# Patient Record
Sex: Female | Born: 1955
Health system: Southern US, Community
[De-identification: ages and names within clinical notes are randomized; demographics above are authoritative.]

## PROBLEM LIST (undated history)

## (undated) DIAGNOSIS — E7800 Pure hypercholesterolemia, unspecified: Secondary | ICD-10-CM

## (undated) DIAGNOSIS — Z808 Family history of malignant neoplasm of other organs or systems: Secondary | ICD-10-CM

## (undated) DIAGNOSIS — I10 Essential (primary) hypertension: Secondary | ICD-10-CM

## (undated) DIAGNOSIS — E119 Type 2 diabetes mellitus without complications: Secondary | ICD-10-CM

## (undated) DIAGNOSIS — Z9221 Personal history of antineoplastic chemotherapy: Secondary | ICD-10-CM

## (undated) DIAGNOSIS — C50919 Malignant neoplasm of unspecified site of unspecified female breast: Secondary | ICD-10-CM

## (undated) DIAGNOSIS — I639 Cerebral infarction, unspecified: Secondary | ICD-10-CM

## (undated) DIAGNOSIS — Z923 Personal history of irradiation: Secondary | ICD-10-CM

## (undated) DIAGNOSIS — K219 Gastro-esophageal reflux disease without esophagitis: Secondary | ICD-10-CM

## (undated) DIAGNOSIS — T7840XA Allergy, unspecified, initial encounter: Secondary | ICD-10-CM

## (undated) DIAGNOSIS — Z803 Family history of malignant neoplasm of breast: Secondary | ICD-10-CM

## (undated) DIAGNOSIS — R12 Heartburn: Secondary | ICD-10-CM

## (undated) HISTORY — PX: OTHER SURGICAL HISTORY: SHX169

## (undated) HISTORY — DX: Heartburn: R12

## (undated) HISTORY — DX: Type 2 diabetes mellitus without complications: E11.9

## (undated) HISTORY — DX: Family history of malignant neoplasm of breast: Z80.3

## (undated) HISTORY — DX: Malignant neoplasm of unspecified site of unspecified female breast: C50.919

## (undated) HISTORY — DX: Cerebral infarction, unspecified: I63.9

## (undated) HISTORY — DX: Allergy, unspecified, initial encounter: T78.40XA

## (undated) HISTORY — PX: TUBAL LIGATION: SHX77

## (undated) HISTORY — DX: Family history of malignant neoplasm of other organs or systems: Z80.8

---

## 2008-11-13 ENCOUNTER — Emergency Department (HOSPITAL_COMMUNITY): Admission: EM | Admit: 2008-11-13 | Discharge: 2008-11-13 | Payer: Self-pay | Admitting: Emergency Medicine

## 2008-12-16 ENCOUNTER — Emergency Department (HOSPITAL_COMMUNITY): Admission: EM | Admit: 2008-12-16 | Discharge: 2008-12-16 | Payer: Self-pay | Admitting: Emergency Medicine

## 2009-02-17 ENCOUNTER — Emergency Department (HOSPITAL_COMMUNITY): Admission: EM | Admit: 2009-02-17 | Discharge: 2009-02-17 | Payer: Self-pay | Admitting: Emergency Medicine

## 2009-03-15 ENCOUNTER — Encounter: Payer: Self-pay | Admitting: Family Medicine

## 2009-03-15 ENCOUNTER — Ambulatory Visit: Payer: Self-pay | Admitting: Family Medicine

## 2009-03-15 DIAGNOSIS — I10 Essential (primary) hypertension: Secondary | ICD-10-CM | POA: Insufficient documentation

## 2009-03-15 DIAGNOSIS — IMO0002 Reserved for concepts with insufficient information to code with codable children: Secondary | ICD-10-CM | POA: Insufficient documentation

## 2009-03-15 DIAGNOSIS — T7411XA Adult physical abuse, confirmed, initial encounter: Secondary | ICD-10-CM | POA: Insufficient documentation

## 2009-03-15 LAB — CONVERTED CEMR LAB
ALT: 11 units/L (ref 0–35)
AST: 14 units/L (ref 0–37)
Albumin: 4.2 g/dL (ref 3.5–5.2)
Alkaline Phosphatase: 46 units/L (ref 39–117)
BUN: 11 mg/dL (ref 6–23)
CO2: 26 meq/L (ref 19–32)
Calcium: 9.2 mg/dL (ref 8.4–10.5)
Chloride: 103 meq/L (ref 96–112)
Creatinine, Ser: 1.01 mg/dL (ref 0.40–1.20)
Glucose, Bld: 90 mg/dL (ref 70–99)
Potassium: 4.3 meq/L (ref 3.5–5.3)
Sodium: 140 meq/L (ref 135–145)
Total Bilirubin: 0.3 mg/dL (ref 0.3–1.2)
Total Protein: 7.4 g/dL (ref 6.0–8.3)

## 2009-04-05 ENCOUNTER — Ambulatory Visit: Payer: Self-pay | Admitting: Family Medicine

## 2009-04-05 ENCOUNTER — Encounter: Payer: Self-pay | Admitting: Family Medicine

## 2009-04-06 LAB — CONVERTED CEMR LAB
Cholesterol: 271 mg/dL — ABNORMAL HIGH (ref 0–200)
HDL: 45 mg/dL (ref >39)
LDL Cholesterol: 212 mg/dL — ABNORMAL HIGH (ref 0–99)
Total CHOL/HDL Ratio: 6
Triglycerides: 71 mg/dL (ref <150)
VLDL: 14 mg/dL (ref 0–40)

## 2009-04-07 ENCOUNTER — Encounter: Payer: Self-pay | Admitting: Family Medicine

## 2009-04-19 ENCOUNTER — Encounter: Admission: RE | Admit: 2009-04-19 | Discharge: 2009-04-19 | Payer: Self-pay | Admitting: Family Medicine

## 2009-04-20 ENCOUNTER — Ambulatory Visit: Payer: Self-pay | Admitting: Family Medicine

## 2009-04-20 DIAGNOSIS — E785 Hyperlipidemia, unspecified: Secondary | ICD-10-CM | POA: Insufficient documentation

## 2009-04-20 LAB — CONVERTED CEMR LAB
Cholesterol, target level: 200 mg/dL
HDL goal, serum: 40 mg/dL
LDL Goal: 160 mg/dL

## 2009-07-12 ENCOUNTER — Encounter: Payer: Self-pay | Admitting: Family Medicine

## 2010-02-07 ENCOUNTER — Emergency Department (HOSPITAL_COMMUNITY)
Admission: EM | Admit: 2010-02-07 | Discharge: 2010-02-07 | Payer: Self-pay | Source: Home / Self Care | Admitting: Family Medicine

## 2010-02-18 ENCOUNTER — Emergency Department (HOSPITAL_COMMUNITY)
Admission: EM | Admit: 2010-02-18 | Discharge: 2010-02-18 | Payer: Self-pay | Source: Home / Self Care | Admitting: Family Medicine

## 2010-03-01 ENCOUNTER — Telehealth: Payer: Self-pay | Admitting: *Deleted

## 2010-03-14 ENCOUNTER — Ambulatory Visit: Payer: Self-pay | Admitting: Family Medicine

## 2010-03-15 ENCOUNTER — Encounter: Payer: Self-pay | Admitting: Sports Medicine

## 2010-03-15 LAB — CONVERTED CEMR LAB
ALT: 14 units/L (ref 0–35)
AST: 18 units/L (ref 0–37)
Albumin: 4.4 g/dL (ref 3.5–5.2)
Alkaline Phosphatase: 51 units/L (ref 39–117)
BUN: 12 mg/dL (ref 6–23)
CO2: 27 meq/L (ref 19–32)
Calcium: 9.7 mg/dL (ref 8.4–10.5)
Chloride: 100 meq/L (ref 96–112)
Creatinine, Ser: 0.88 mg/dL (ref 0.40–1.20)
Direct LDL: 200 mg/dL — ABNORMAL HIGH
Glucose, Bld: 85 mg/dL (ref 70–99)
Potassium: 3.8 meq/L (ref 3.5–5.3)
Sodium: 137 meq/L (ref 135–145)
Total Bilirubin: 0.3 mg/dL (ref 0.3–1.2)
Total Protein: 7.9 g/dL (ref 6.0–8.3)

## 2010-04-04 ENCOUNTER — Telehealth: Payer: Self-pay | Admitting: Sports Medicine

## 2010-05-02 NOTE — Assessment & Plan Note (Signed)
Summary: f/u labs,df   Vital Signs:  Patient profile:   55 year old female Weight:      175 pounds Temp:     99.2 degrees F oral Pulse rate:   96 / minute Pulse rhythm:   regular BP sitting:   134 / 83  Vitals Entered By: Loralee Pacas CMA (April 20, 2009 3:38 PM)  Primary Care Provider:  Lequita Asal  MD  CC:  f/u HLD.  History of Present Illness: patient with h/o HTN. recent lipid panel with LDL 212 and total cholesterol of 271. patient here to discuss whether to start medication. on medication in the past. doesnt recall name. caused hair loss and "not feeling right." so it was stopped. takes OTC niacin daily. not interested in medications.   Lipid Management History:      Positive NCEP/ATP III risk factors include hypertension.  Negative NCEP/ATP III risk factors include female age less than 12 years old, no history of early menopause without estrogen hormone replacement, non-diabetic, no family history for ischemic heart disease, non-tobacco-user status, no ASHD (atherosclerotic heart disease), no prior stroke/TIA, no peripheral vascular disease, and no history of aortic aneurysm.    Current Medications (verified): 1)  Lisinopril-Hydrochlorothiazide 20-25 Mg Tabs (Lisinopril-Hydrochlorothiazide) .... One Tab By Mouth Daily 2)  Niacin 500 Mg Tabs (Niacin) .... One Tab By Mouth Daily 3)  Multivitamins  Tabs (Multiple Vitamin) .... One Tab By Mouth Daily 4)  Ibuprofen 800 Mg Tabs (Ibuprofen) .... One Tab By Mouth Three Times A Day As Needed For Leg Pain  Allergies (verified): No Known Drug Allergies  Past History:  Past Medical History: HTN HLD  Physical Exam  General:  Well-developed,well-nourished,in no acute distress; alert,appropriate and cooperative throughout examination. vitals reviewed.    Impression & Recommendations:  Problem # 1:  HYPERLIPIDEMIA (ICD-272.4) Assessment New  patient resistant to starting statin. would like to try lifestyle  modifications first. explained importance of lowering cholesterol particularly with hypertension given cardiovascular risks. she expressed understanding. f/u in 5 months.   Her updated medication list for this problem includes:    Niacin 500 Mg Tabs (Niacin) ..... One tab by mouth daily  Orders: [facility]- Est Level  3 (66063)  Problem # 2:  ESSENTIAL HYPERTENSION, BENIGN (ICD-401.1) Assessment: Unchanged  at goal. no changes.   Her updated medication list for this problem includes:    Lisinopril-hydrochlorothiazide 20-25 Mg Tabs (Lisinopril-hydrochlorothiazide) ..... One tab by mouth daily  Orders: [facility]- Est Level  3 (01601)  Complete Medication List: 1)  Lisinopril-hydrochlorothiazide 20-25 Mg Tabs (Lisinopril-hydrochlorothiazide) .... One tab by mouth daily 2)  Niacin 500 Mg Tabs (Niacin) .... One tab by mouth daily 3)  Multivitamins Tabs (Multiple vitamin) .... One tab by mouth daily 4)  Ibuprofen 800 Mg Tabs (Ibuprofen) .... One tab by mouth three times a day as needed for leg pain  Lipid Assessment/Plan:      Based on NCEP/ATP III, the patient's risk factor category is "0-1 risk factors".  The patient's lipid goals are as follows: Total cholesterol goal is 200; LDL cholesterol goal is 160; HDL cholesterol goal is 40; Triglyceride goal is 150.  Her LDL cholesterol goal has not been met.  She has been counseled on adjunctive measures for lowering her cholesterol and has been provided with dietary instructions.      Prevention & Chronic Care Immunizations   Influenza vaccine: received at outside hospital  (03/02/2009)    Tetanus booster: Not documented    Pneumococcal vaccine: Not documented  Pneumococcal vaccine deferral: Not indicated  (03/15/2009)  Colorectal Screening   Hemoccult: Not documented    Colonoscopy: Not documented   Colonoscopy action/deferral: GI Referral  (03/15/2009)  Other Screening   Pap smear: NEGATIVE FOR INTRAEPITHELIAL LESIONS OR MALIGNANCY.   (04/05/2009)   Pap smear action/deferral: Deferred  (03/15/2009)    Mammogram: ASSESSMENT: Negative - BI-RADS 1^MM DIGITAL SCREENING  (04/19/2009)   Mammogram action/deferral: Ordered  (03/15/2009)   Smoking status: never  (03/15/2009)  Lipids   Total Cholesterol: 271  (04/05/2009)   LDL: 212  (04/05/2009)   LDL Direct: Not documented   HDL: 45  (04/05/2009)   Triglycerides: 71  (04/05/2009)    SGOT (AST): 14  (03/15/2009)   SGPT (ALT): 11  (03/15/2009)   Alkaline phosphatase: 46  (03/15/2009)   Total bilirubin: 0.3  (03/15/2009)    Lipid flowsheet reviewed?: Yes   Progress toward LDL goal: Unchanged  Hypertension   Last Blood Pressure: 134 / 83  (04/20/2009)   Serum creatinine: 1.01  (03/15/2009)   Serum potassium 4.3  (03/15/2009)    Hypertension flowsheet reviewed?: Yes   Progress toward BP goal: At goal  Self-Management Support :   Personal Goals (by the next clinic visit) :      Personal blood pressure goal: 140/90  (03/15/2009)     Personal LDL goal: 130  (04/20/2009)    Patient will work on the following items until the next clinic visit to reach self-care goals:     Medications and monitoring: take my medicines every day  (04/05/2009)     Eating: eat baked foods instead of fried foods  (04/20/2009)     Activity: take a 30 minute walk every day  (04/20/2009)    Hypertension self-management support: Not documented    Hypertension self-management support not done because: Good outcomes  (04/20/2009)    Lipid self-management support: Not documented     Lipid self-management support not done because: Refused  (04/20/2009)

## 2010-05-02 NOTE — Assessment & Plan Note (Signed)
Summary: F/U EO   Vital Signs:  Patient profile:   55 year old female Height:      63.25 inches Weight:      175 pounds BMI:     30.87 Pulse rate:   66 / minute BP sitting:   120 / 80  (right arm)  Vitals Entered By: Arlyss Repress CMA, (April 05, 2009 9:05 AM) CC: f/up HTN and pap Is Patient Diabetic? No Pain Assessment Patient in pain? yes     Location: legs Intensity: 3 Onset of pain  x 1 month   CC:  f/up HTN and pap.  History of Present Illness: 55 y/o female here for f/u HTN, pap, and leg pain  bilateral lower extremity "aching"- improved with exercises and aleve. no swelling, calf pain. no h/o tobacco abuse.   HTN- taking lisinopril-hctz. denies chest pain, SOB, headaches, blurred vision.     Current Medications (verified): 1)  Lisinopril-Hydrochlorothiazide 20-25 Mg Tabs (Lisinopril-Hydrochlorothiazide) .... One Tab By Mouth Daily 2)  Niacin 500 Mg Tabs (Niacin) .... One Tab By Mouth Daily 3)  Multivitamins  Tabs (Multiple Vitamin) .... One Tab By Mouth Daily 4)  Ibuprofen 800 Mg Tabs (Ibuprofen) .... One Tab By Mouth Three Times A Day As Needed For Leg Pain  Allergies (verified): No Known Drug Allergies  Physical Exam  General:  Well-developed,well-nourished,in no acute distress; alert,appropriate and cooperative throughout examination. vitals reviewed.  Mouth:  Oral mucosa and oropharynx without lesions or exudates.  Teeth in good repair. Lungs:  Normal respiratory effort, chest expands symmetrically. Lungs are clear to auscultation, no crackles or wheezes. Heart:  Normal rate and regular rhythm. S1 and S2 normal without gallop, murmur, click, rub or other extra sounds. Genitalia:  Normal introitus for age, no external lesions, no vaginal discharge, mucosa pink and moist, no vaginal or cervical lesions, no vaginal atrophy, no friaility or hemorrhage, normal uterus size and position, no adnexal masses or tenderness Extremities:  TTP of bilateral anterior  shins. no pitting edema of BLE. no palpable cords or posterior calf tenderness.    Impression & Recommendations:  Problem # 1:  ESSENTIAL HYPERTENSION, BENIGN (ICD-401.1) Assessment Unchanged  no changes. check FLP.  Her updated medication list for this problem includes:    Lisinopril-hydrochlorothiazide 20-25 Mg Tabs (Lisinopril-hydrochlorothiazide) ..... One tab by mouth daily  Orders: [facility]- Est Level  3 (11914)  Problem # 2:  SHIN SPLINTS (ICD-844.9) Assessment: Improved  change to high dose ibuprofen from aleve.   Orders: [facility]- Est Level  3 ([id number])  Problem # 3:  Preventive Health Care (ICD-V70.0) pap completed. info for mammogram provided. will attempt GI referral for screening colonoscopy  Complete Medication List: 1)  Lisinopril-hydrochlorothiazide 20-25 Mg Tabs (Lisinopril-hydrochlorothiazide) .... One tab by mouth daily 2)  Niacin 500 Mg Tabs (Niacin) .... One tab by mouth daily 3)  Multivitamins Tabs (Multiple vitamin) .... One tab by mouth daily 4)  Ibuprofen 800 Mg Tabs (Ibuprofen) .... One tab by mouth three times a day as needed for leg pain  Other Orders: Pap Smear-[facility] ([id number]) Gastroenterology Referral (GI)  Patient Instructions: 1)  Follow up with Dr. Lanier Prude in 5 months (June) 2)  I will contact you with results of your bloodwork 3)  We will call you with GI referral for colonoscopy. Prescriptions: IBUPROFEN 800 MG TABS (IBUPROFEN) one tab by mouth three times a day as needed for leg pain  #90 x 3   Entered and Authorized by:   Lequita Asal  MD  Signed by:   Lequita Asal  MD on 04/05/2009   Method used:   Electronically to        [facility] (retail)       [address]       [place], [place]  [address]       Ph: [phone]       Fax: [phone]   RxID:   [id number]   Prevention & Chronic Care Immunizations   Influenza vaccine: received at outside hospital  (03/02/2009)    Tetanus booster:  Not documented    Pneumococcal vaccine: Not documented   Pneumococcal vaccine deferral: Not indicated  (03/15/2009)  Colorectal Screening   Hemoccult: Not documented    Colonoscopy: Not documented   Colonoscopy action/deferral: GI Referral  (03/15/2009)  Other Screening   Pap smear: Not documented   Pap smear action/deferral: Deferred  (03/15/2009)    Mammogram: Not documented   Mammogram action/deferral: Ordered  (03/15/2009)   Smoking status: never  (03/15/2009)  Lipids   Total Cholesterol: Not documented   LDL: Not documented   LDL Direct: Not documented   HDL: Not documented   Triglycerides: Not documented  Hypertension   Last Blood Pressure: 120 / 80  (04/05/2009)   Serum creatinine: 1.01  (03/15/2009)   Serum potassium 4.3  (03/15/2009)    Hypertension flowsheet reviewed?: Yes   Progress toward BP goal: At goal  Self-Management Support :   Personal Goals (by the next clinic visit) :      Personal blood pressure goal: 140/90  (03/15/2009)   Patient will work on the following items until the next clinic visit to reach self-care goals:     Medications and monitoring: take my medicines every day  (04/05/2009)     Eating: eat foods that are low in salt  (04/05/2009)     Activity: take a 30 minute walk every day  (03/15/2009)    Hypertension self-management support: Not documented    Hypertension self-management support not done because: Good outcomes  (04/05/2009)
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
## 2010-05-02 NOTE — Letter (Signed)
Summary: Results Follow-up Letter  [facility]  [address]   [place], [place] [address]   Phone: [phone]  Fax: [phone]    04/07/2009  [address] [place], [place]  [address]  Dear Ms. Lorton,   The following are the results of your recent test(s):  Test     Result     Pap Smear    Normal___x__  Not Normal_____       Comments: Please call if you have questions.    Sincerely,  Lequita Asal  MD [name]           Appended Document: Results Follow-up Letter mailed.

## 2010-05-02 NOTE — Progress Notes (Signed)
  Phone Note Refill Request   Refills Requested: Medication #1:  LISINOPRIL-HYDROCHLOROTHIAZIDE 20-25 MG TABS one tab by mouth daily Pt need refill sent to [facility]   Initial call taken by: Abundio Miu,  March 01, 2010 4:31 PM  Follow-up for Phone Call        Pt should come see me within a couple weeks to make sure things are going well and catch up on preventative issues, 1 month rx called in. Follow-up by: [name] MD,  March 02, 2010 8:58 AM  Additional Follow-up for Phone Call Additional follow up Details #1::        called and informed pt she has appt on 12.13 Additional Follow-up by: Loralee Pacas CMA,  March 03, 2010 1:47 PM    Prescriptions: LISINOPRIL-HYDROCHLOROTHIAZIDE 20-25 MG TABS (LISINOPRIL-HYDROCHLOROTHIAZIDE) one tab by mouth daily  #30 x 0   Entered and Authorized by:   [name] MD   Signed by:   [name] MD on 03/02/2010   Method used:   Electronically to        [facility] [address] (retail)       [phone]       [place], [place]  [address]       Ph: [phone]       Fax: [phone]   RxID:   [id number]

## 2010-05-02 NOTE — Miscellaneous (Signed)
Summary: Re: GI referral  Clinical Lists Changes   received notification from [facility] that they are unable to  complete the referral for GI at this time due to lack of volunteer physicians in this speciality group. they will notifiy patient when they can process the referral. Theresia Lo RN  July 12, 2009 8:52 AM

## 2010-05-03 ENCOUNTER — Encounter (INDEPENDENT_AMBULATORY_CARE_PROVIDER_SITE_OTHER): Payer: Self-pay | Admitting: Sports Medicine

## 2010-05-03 ENCOUNTER — Ambulatory Visit: Admit: 2010-05-03 | Payer: Self-pay

## 2010-05-03 ENCOUNTER — Encounter: Payer: Self-pay | Admitting: Sports Medicine

## 2010-05-03 DIAGNOSIS — I10 Essential (primary) hypertension: Secondary | ICD-10-CM

## 2010-05-03 DIAGNOSIS — E785 Hyperlipidemia, unspecified: Secondary | ICD-10-CM

## 2010-05-03 NOTE — Progress Notes (Signed)
  Subjective:    Patient ID: Tasha Ortiz, female    DOB: Nov 24, 1955, 54 y.o.   MRN: [id number]  HPI    Review of Systems     Objective:   Physical Exam        Assessment & Plan:

## 2010-05-03 NOTE — Assessment & Plan Note (Signed)
Needs refill on lisinopril/hctz.

## 2010-05-03 NOTE — Assessment & Plan Note (Signed)
LE cramping may be statin ADR however alopecia is not from statin. I suspect that her hair loss may be related to alopecia areata with the circular area of hair loss however will assess for regrowth off pravastatin. We will change to Lovastatin  And see if her cramps come back. Should her symptoms recur would need to switch classes, likely to fenofibrate. RTC prn.

## 2010-05-03 NOTE — Progress Notes (Signed)
  Subjective:    Patient ID: Tasha Ortiz, female    DOB: 03-Oct-1955, 55 y.o.   MRN: [id number]  HPI Pt comes in with c/o medication side effect.  She started taking her pravastatin and within a week legs were hurting and hair started falling out.  Leg pain was crampy/burning type pain.  B/L calves.  Stopped taking her pravastatin 2 wks ago and symptoms improving. Hair would come out when she brushed her hair so she cut it all off and started wearing a wig.     Review of Systems   Neg except as in HPI  Objective:   Physical Exam  Constitutional: She appears well-developed and well-nourished. No distress.  HENT:       Hair is short with a circular area of alopecia on top left side.    Cardiovascular: Normal rate, regular rhythm, normal heart sounds and intact distal pulses.   Pulmonary/Chest: Effort normal and breath sounds normal.  Musculoskeletal: Normal range of motion. She exhibits no edema and no tenderness.  Skin: Skin is warm and dry.          Assessment & Plan:

## 2010-05-04 NOTE — Letter (Signed)
Summary: Lipid Letter  [facility]  [address]   [place], [place] [address]   Phone: [phone]  Fax: [phone]    03/15/2010  Tasha Ortiz [address] [place], [place]  [address]  Dear Tasha Ortiz:  We have carefully reviewed your last lipid profile from 04/05/2009 and the results are noted below with a summary of recommendations for lipid management.    LDL "bad" Cholesterol:   200     Goal: <160    Your lipid goals have not been met; we recommend improving on your TLC diet and Adjunctive Measures (see below) and make the following changes to your regimen:  You will need to stop your Niacin and start a new cholesterol medication, Pravastatin.    TLC Diet (Therapeutic Lifestyle Change): Saturated Fats & Transfatty acids should be kept < 7% of total calories ***Reduce Saturated Fats Polyunstaurated Fat can be up to 10% of total calories Monounsaturated Fat Fat can be up to 20% of total calories Total Fat should be no greater than 25-35% of total calories Carbohydrates should be 50-60% of total calories Protein should be approximately 15% of total calories Fiber should be at least 20-30 grams a day ***Increased fiber may help lower LDL Total Cholesterol should be < 200mg /day Consider adding plant stanol/sterols to diet (example: Benacol spread) ***A higher intake of unsaturated fat may reduce Triglycerides and Increase HDL    Adjunctive Measures (may lower LIPIDS and reduce risk of Heart Attack) include: Aerobic Exercise (20-30 minutes 3-4 times a week) Limit Alcohol Consumption Weight Reduction Aspirin 75-81 mg a day by mouth (if not allergic or contraindicated) Dietary Fiber 20-30 grams a day by mouth     Current Medications: 1)    Lisinopril-hydrochlorothiazide 20-25 Mg Tabs (Lisinopril-hydrochlorothiazide) .... One tab by mouth daily 2)    Pravastatin Sodium 40 Mg Tabs (Pravastatin sodium) .... One tab by mouth daily 3)    Multivitamins  Tabs  (Multiple vitamin) .... One tab by mouth daily 4)    Ibuprofen 800 Mg Tabs (Ibuprofen) .... One tab by mouth three times a day as needed for leg pain  If you have any questions, please call. We appreciate being able to work with you.   Sincerely,    [name] [name] MD  Appended Document: Lipid Letter mailed

## 2010-05-04 NOTE — Progress Notes (Signed)
Summary: Medication side effects  Phone Note Call from Patient Call back at Home Phone [phone]   Reason for Call: Talk to Doctor Summary of Call: pt having side effects from cholesterol medication, wants to speak with MD. Initial call taken by: Knox Royalty,  April 04, 2010 10:30 AM  Follow-up for Phone Call        fwd to pcp Follow-up by: Jimmy Footman, CMA,  April 04, 2010 10:53 AM  Additional Follow-up for Phone Call Additional follow up Details #1::        On inpatient service so won't be able to call her for a while,  what is the side effect? Additional Follow-up by: [name] MD,  April 04, 2010 6:27 PM    Additional Follow-up for Phone Call Additional follow up Details #2::    LVM for pt to call back to give [place] the side effects of med she is having.  Follow-up by: Jimmy Footman, CMA,  April 05, 2010 4:36 PM  Additional Follow-up for Phone Call Additional follow up Details #3:: Details for Additional Follow-up Action Taken: hair is falling out and legs are bothering her please advise Additional Follow-up by: Loralee Pacas CMA,  April 06, 2010 3:06 PM  She can come see me regarding this.  Ok to stop the medication for now though these symptoms may be due to something else. [name] MD  April 06, 2010 3:22 PM   spoke with pt and she stated that she took med this am but will d/c after today. she has and appt on 01.26 to follow up with you concerning this.Marland KitchenMarland KitchenLoralee Pacas CMA  April 07, 2010 10:53 AM

## 2010-05-04 NOTE — Assessment & Plan Note (Signed)
Summary: F/U,DF   Vital Signs:  Patient profile:   55 year old female Weight:      177 pounds Temp:     98.6 degrees F oral Pulse rate:   66 / minute Pulse rhythm:   regular BP sitting:   132 / 78  (left arm) Cuff size:   regular  Vitals Entered By: Loralee Pacas CMA (March 14, 2010 2:10 PM)  Primary Care Provider:  Lequita Asal  MD   History of Present Illness: 55 yo female here with stuffy ears.  Ears: feel stopped up, for approx 2 weeks now, no URI symptoms, no tinnitus, Balance ok.  HLD:  Due for LDL.  HTN:  BP well controlled.  Preventive medicine:  Due for flu shot/colonoscopy, she has tried to get a colonoscopy but has no insurance and so has been put on a waiting list.  Current Medications (verified): 1)  Lisinopril-Hydrochlorothiazide 20-25 Mg Tabs (Lisinopril-Hydrochlorothiazide) .... One Tab By Mouth Daily 2)  Niacin 500 Mg Tabs (Niacin) .... One Tab By Mouth Daily 3)  Multivitamins  Tabs (Multiple Vitamin) .... One Tab By Mouth Daily 4)  Ibuprofen 800 Mg Tabs (Ibuprofen) .... One Tab By Mouth Three Times A Day As Needed For Leg Pain  Allergies (verified): No Known Drug Allergies  Review of Systems       See HPI  Physical Exam  General:  Well-developed,well-nourished,in no acute distress; alert,appropriate and cooperative throughout examination Head:  Normocephalic and atraumatic without obvious abnormalities. No apparent alopecia or balding. Eyes:  No corneal or conjunctival inflammation noted. EOMI. Perrl Ears:  B/l cerumen impaction, flushed but eventually had to dig out impactions with curette, massive amounts of impacted cerumen removed from both EAMs.  Pt significantly better after this. Nose:  External nasal examination shows no deformity or inflammation. Nasal mucosa are pink and moist without lesions or exudates. Mouth:  Oral mucosa and oropharynx without lesions or exudates.   Neck:  No deformities, masses, or tenderness noted. Lungs:   Normal respiratory effort, chest expands symmetrically. Lungs are clear to auscultation, no crackles or wheezes. Heart:  Normal rate and regular rhythm. S1 and S2 normal without gallop, murmur, click, rub or other extra sounds.   Impression & Recommendations:  Problem # 1:  CERUMEN IMPACTION, BILATERAL (ICD-380.4) Assessment New Removed.  Orders: [facility]- Est  Level 4 (30865) Cerumen Impaction Removal-[facility] (78469)  Problem # 2:  HYPERLIPIDEMIA (ICD-272.4) Assessment: Unchanged Checking dLDL, if uncontrolled would start pravastatin.  Her updated medication list for this problem includes:    Niacin 500 Mg Tabs (Niacin) ..... One tab by mouth daily  Orders: [facility]- Est  Level 4 (99214) Comp Met-[facility] (62952-84132) Direct LDL-[facility] (44010-27253)  Problem # 3:  ESSENTIAL HYPERTENSION, BENIGN (ICD-401.1) Assessment: Improved Controlled, no changes.  Her updated medication list for this problem includes:    Lisinopril-hydrochlorothiazide 20-25 Mg Tabs (Lisinopril-hydrochlorothiazide) ..... One tab by mouth daily  Orders: [facility]- Est  Level 4 (99214) Comp Met-[facility] (66440-34742)  Complete Medication List: 1)  Lisinopril-hydrochlorothiazide 20-25 Mg Tabs (Lisinopril-hydrochlorothiazide) .... One tab by mouth daily 2)  Niacin 500 Mg Tabs (Niacin) .... One tab by mouth daily 3)  Multivitamins Tabs (Multiple vitamin) .... One tab by mouth daily 4)  Ibuprofen 800 Mg Tabs (Ibuprofen) .... One tab by mouth three times a day as needed for leg pain   Orders Added: 1)  [facility]- Est  Level 4 [99214] 2)  Cerumen Impaction Removal-[facility] [69210] 3)  Comp Met-[facility] [59563-87564] 4)  Direct LDL-[facility] [33295-18841]  CMP ordered   CMP ordered

## 2010-05-10 NOTE — Assessment & Plan Note (Signed)
Summary: meds causing loss of hair/tlb   Vital Signs:  Patient profile:   55 year old female Height:      63.25 inches Weight:      180.6 pounds BMI:     31.85 Temp:     98.4 degrees F oral Pulse rate:   71 / minute BP sitting:   144 / 83  (left arm) Cuff size:   regular  Vitals Entered By: Jimmy Footman, CMA (May 03, 2010 9:09 AM) CC: follow-up visit Is Patient Diabetic? No   Primary Care Provider:  [name] MD  CC:  follow-up visit.  History of Present Illness: HPI Pt comes in with c/o medication side effect.  She started taking her pravastatin and within a week legs were hurting and hair started falling out.  Leg pain was crampy/burning type pain.  B/L calves.  Stopped taking her pravastatin 2 wks ago and symptoms improving. Hair would come out when she brushed her hair so she cut it all off and started wearing a wig.       Review of Systems   Neg except as in HPI   Objective:    Physical Exam  Constitutional: She appears well-developed and well-nourished. No distress.  HENT:       Hair is short with a circular area of alopecia on top left side.    Cardiovascular: Normal rate, regular rhythm, normal heart sounds and intact distal pulses.   Pulmonary/Chest: Effort normal and breath sounds normal.  Musculoskeletal: Normal range of motion. She exhibits no edema and no tenderness.  Skin: Skin is warm and dry.           Assessment & Plan:              ESSENTIAL HYPERTENSION, BENIGN - [facility]  05/03/2010 12:15 PM  Signed Needs refill on lisinopril/hctz.  HYPERLIPIDEMIA [name]  05/03/2010 12:15 PM  Signed LE cramping may be statin ADR however alopecia is not from statin. I suspect that her hair loss may be related to alopecia areata with the circular area of hair loss however will assess for regrowth off pravastatin. We will change to Lovastatin  And see if her cramps come back. Should her symptoms recur would need to  switch classes, likely to fenofibrate. RTC prn.     Allergies: No Known Drug Allergies   Complete Medication List: 1)  Lisinopril-hydrochlorothiazide 20-25 Mg Tabs (Lisinopril-hydrochlorothiazide) .... One tab by mouth daily 2)  Lovastatin 20 Mg Tabs (Lovastatin) .... One tab by mouth daily 3)  Multivitamins Tabs (Multiple vitamin) .... One tab by mouth daily 4)  Ibuprofen 800 Mg Tabs (Ibuprofen) .... One tab by mouth three times a day as needed for leg pain  Other Orders: [facility]- Est  Level 4 (16109)  Patient Instructions: 1)  I have changed your cholesterol medication. 2)  Try the new one. 3)  Refilled BP medication. 4)  Come back to see me if you still have leg pain and we will have to change to a different class of medication. 5)  -Dr. Karie Schwalbe. Prescriptions: LISINOPRIL-HYDROCHLOROTHIAZIDE 20-25 MG TABS (LISINOPRIL-HYDROCHLOROTHIAZIDE) one tab by mouth daily  #90 x 3   Entered and Authorized by:   [name] MD   Signed by:   [name] MD on 05/03/2010   Method used:   Electronically to        [facility] [address] (retail)       [phone]  [place], [place]  [address]       Ph: [phone]       Fax: [phone]   RxID:   [id number] LOVASTATIN 20 MG TABS (LOVASTATIN) One tab by mouth daily  #90 x 3   Entered and Authorized by:   [name] MD   Signed by:   [name] MD on 05/03/2010   Method used:   Electronically to        [facility] [address] (retail)       [phone]       [place], [place]  [address]       Ph: [phone]       Fax: [phone]   RxID:   [id number]    Orders Added: 1)  [facility]- Est  Level 4 [27062]    Last PAP:  NEGATIVE FOR INTRAEPITHELIAL LESIONS OR MALIGNANCY. (04/05/2009 12:00:00 AM) PAP Next Due:  1 yr Last Mammogram:  ASSESSMENT: Negative - BI-RADS 1^MM DIGITAL SCREENING (04/19/2009 3:27:00 PM) Mammogram Next  Due:  1 yr Last Creatinine:  0.88 (03/15/2010 12:55:00 AM) Creatinine Next Due: 1 yr Last Potassium:  3.8 (03/15/2010 12:55:00 AM) Potassium Next Due:  1 yr

## 2010-06-13 LAB — GC/CHLAMYDIA PROBE AMP, GENITAL
Chlamydia, DNA Probe: NEGATIVE
GC Probe Amp, Genital: NEGATIVE

## 2010-06-13 LAB — WET PREP, GENITAL
Trich, Wet Prep: NONE SEEN
Yeast Wet Prep HPF POC: NONE SEEN

## 2010-06-13 LAB — POCT URINALYSIS DIPSTICK
Bilirubin Urine: NEGATIVE
Glucose, UA: NEGATIVE mg/dL
Hgb urine dipstick: NEGATIVE
Ketones, ur: NEGATIVE mg/dL
Nitrite: NEGATIVE
Protein, ur: NEGATIVE mg/dL
Specific Gravity, Urine: 1.015 (ref 1.005–1.030)
Urobilinogen, UA: 2 mg/dL — ABNORMAL HIGH (ref 0.0–1.0)
pH: 7 (ref 5.0–8.0)

## 2010-06-13 LAB — POCT PREGNANCY, URINE: Preg Test, Ur: NEGATIVE

## 2010-06-27 ENCOUNTER — Encounter: Payer: Self-pay | Admitting: Sports Medicine

## 2010-06-27 ENCOUNTER — Ambulatory Visit (INDEPENDENT_AMBULATORY_CARE_PROVIDER_SITE_OTHER): Payer: Self-pay | Admitting: Sports Medicine

## 2010-06-27 DIAGNOSIS — M79604 Pain in right leg: Secondary | ICD-10-CM | POA: Insufficient documentation

## 2010-06-27 DIAGNOSIS — E785 Hyperlipidemia, unspecified: Secondary | ICD-10-CM

## 2010-06-27 DIAGNOSIS — M79609 Pain in unspecified limb: Secondary | ICD-10-CM

## 2010-06-27 DIAGNOSIS — M79605 Pain in left leg: Secondary | ICD-10-CM | POA: Insufficient documentation

## 2010-06-27 LAB — LIPID PANEL
Cholesterol: 213 mg/dL — ABNORMAL HIGH (ref 0–200)
HDL: 44 mg/dL (ref >39)
LDL Cholesterol: 148 mg/dL — ABNORMAL HIGH (ref 0–99)
Total CHOL/HDL Ratio: 4.8 Ratio
Triglycerides: 105 mg/dL (ref <150)
VLDL: 21 mg/dL (ref 0–40)

## 2010-06-27 LAB — COMPREHENSIVE METABOLIC PANEL
ALT: 39 U/L — ABNORMAL HIGH (ref 0–35)
AST: 43 U/L — ABNORMAL HIGH (ref 0–37)
Albumin: 4.6 g/dL (ref 3.5–5.2)
Alkaline Phosphatase: 49 U/L (ref 39–117)
BUN: 12 mg/dL (ref 6–23)
CO2: 26 mEq/L (ref 19–32)
Calcium: 9.6 mg/dL (ref 8.4–10.5)
Chloride: 101 mEq/L (ref 96–112)
Creat: 0.99 mg/dL (ref 0.40–1.20)
Glucose, Bld: 97 mg/dL (ref 70–99)
Potassium: 3.4 mEq/L — ABNORMAL LOW (ref 3.5–5.3)
Sodium: 138 mEq/L (ref 135–145)
Total Bilirubin: 0.3 mg/dL (ref 0.3–1.2)
Total Protein: 7.8 g/dL (ref 6.0–8.3)

## 2010-06-27 LAB — CK: Total CK: 107 U/L (ref 7–177)

## 2010-06-27 LAB — D-DIMER, QUANTITATIVE (NOT AT ARMC): D-Dimer, Quant: 0.26 ug/mL-FEU (ref 0.00–0.48)

## 2010-06-27 MED ORDER — MELOXICAM 15 MG PO TABS: 15.0000 mg | ORAL_TABLET | Freq: Every day | ORAL | Status: DC

## 2010-06-27 NOTE — Assessment & Plan Note (Signed)
Unclear etiology, went away after switching statins, then came back. Do not suspect statin myopathy. Currently doing zoomba classes BIW but pain present before. Associated 1+ pitting edema. Checking CMET, CPK, BNP, d dimer. RTC 1 week to discuss results. Mobic for pain.

## 2010-06-27 NOTE — Assessment & Plan Note (Signed)
Checking lipid panel. 

## 2010-06-27 NOTE — Progress Notes (Signed)
  Subjective:    Patient ID: Tasha Ortiz, female    DOB: Jan 19, 1956, 55 y.o.   MRN: [id number]  HPI Notes 2 weeks to B/L lower leg pain, in calves and ant shins.  Better with walking, ibuprofen, Keeps her up at night.  No fevers/chills, no trauma.  Does zoomba 2x a week but pain present before this.  No symptoms from back.  Had pain in the past on statin but this resolved with switching statins.  No swelling.     Review of Systems See HPI   Objective:   Physical Exam  Constitutional: She appears well-developed and well-nourished.  Cardiovascular: Normal rate, regular rhythm and normal heart sounds.  Exam reveals no gallop and no friction rub.   No murmur heard. Pulmonary/Chest: Effort normal. No respiratory distress. She has no wheezes. She has no rales. She exhibits no tenderness.  Musculoskeletal:       Lower ext unremarkable to inspection. 1+ pitting edema B/L TTP calves and anterior tibialis diffusely. Warm, well perfused Neg SLR B/L.  Knee and ankle exam unremarkable.  Skin: Skin is warm and dry.          Assessment & Plan:

## 2010-06-27 NOTE — Patient Instructions (Signed)
Checking some bloodwork. Take mobic 15mg  daily for pain. Stop ibuprofen for now. Come back to see me in a week to discuss symptoms. We also need a dedicated appt to discuss primary care issues. Ihor Austin. Benjamin Stain, M.D.

## 2010-06-28 LAB — BRAIN NATRIURETIC PEPTIDE: Brain Natriuretic Peptide: 54.3 pg/mL (ref 0.0–100.0)

## 2010-07-06 ENCOUNTER — Ambulatory Visit: Payer: Self-pay | Admitting: Sports Medicine

## 2010-07-17 ENCOUNTER — Ambulatory Visit: Payer: Self-pay | Admitting: Sports Medicine

## 2010-07-18 ENCOUNTER — Ambulatory Visit: Payer: Self-pay | Admitting: Sports Medicine

## 2010-07-20 ENCOUNTER — Ambulatory Visit (INDEPENDENT_AMBULATORY_CARE_PROVIDER_SITE_OTHER): Payer: Self-pay | Admitting: Sports Medicine

## 2010-07-20 ENCOUNTER — Encounter: Payer: Self-pay | Admitting: Sports Medicine

## 2010-07-20 VITALS — BP 137/93 | HR 60 | Temp 98.4°F | Ht 63.0 in | Wt 182.0 lb

## 2010-07-20 DIAGNOSIS — M79609 Pain in unspecified limb: Secondary | ICD-10-CM

## 2010-07-20 DIAGNOSIS — M79605 Pain in left leg: Secondary | ICD-10-CM

## 2010-07-20 DIAGNOSIS — I10 Essential (primary) hypertension: Secondary | ICD-10-CM

## 2010-07-20 DIAGNOSIS — M79604 Pain in right leg: Secondary | ICD-10-CM

## 2010-07-20 DIAGNOSIS — E785 Hyperlipidemia, unspecified: Secondary | ICD-10-CM

## 2010-07-20 MED ORDER — FENOFIBRATE 145 MG PO TABS: 145.0000 mg | ORAL_TABLET | Freq: Every day | ORAL | Status: DC

## 2010-07-20 MED ORDER — ASPIRIN EC 81 MG PO TBEC: 81.0000 mg | DELAYED_RELEASE_TABLET | Freq: Every day | ORAL | Status: DC

## 2010-07-20 NOTE — Assessment & Plan Note (Signed)
See below.  Will stop statins/lovastatin and change to fenofibrate. Pt to RTC 3 months to recheck lipids. Goal LDL: <130.

## 2010-07-20 NOTE — Assessment & Plan Note (Signed)
Compliant with meds, no changes. BP ok. Goal <140/90.

## 2010-07-20 NOTE — Assessment & Plan Note (Signed)
Significantly improved. Using mobic as directed. Will go ahead and DC statin and start fenofibrate. Pt to RTC 3 months to recheck lipids.

## 2010-07-20 NOTE — Progress Notes (Signed)
  Subjective:    Patient ID: Tasha Ortiz, female    DOB: [birth year], 55 y.o.   MRN: [id number]  HPI  B/L LE pain:  Noted since March of this year. B/L lower leg pain, in calves and ant shins.  Better with walking, ibuprofen, Keeps her up at night.  No fevers/chills, no trauma.  Does zoomba 2x a week but pain present before this.  No symptoms from back.  Had pain in the past on statin but this resolved with switching statins.  No swelling. She was put on mobic which helps a lot however some pain still present.  BNP, CPK, D-dimer, chemistries all negative at last visit.    Review of Systems  See HPI   Objective:   Physical Exam  Constitutional: She appears well-developed and well-nourished.  Cardiovascular: Normal rate, regular rhythm and normal heart sounds.  Exam reveals no gallop and no friction rub.   No murmur heard. Pulmonary/Chest: Effort normal. No respiratory distress. She has no wheezes. She has no rales. She exhibits no tenderness.  Musculoskeletal:       Lower ext unremarkable to inspection. 1+ pitting edema B/L Very mild TTP calves and anterior tibialis diffusely. Warm, well perfused Neg SLR B/L.  Knee and ankle exam unremarkable.  Skin: Skin is warm and dry.          Assessment & Plan:

## 2010-07-20 NOTE — Patient Instructions (Signed)
Great to see you today, Stop your lovastatin and start taking fenofibrate for cholesterol. Come back to get your cholesterol checked in 3 months. Would also like you to take a baby aspirin a day to prevent heart disease and strokes. I have called in all medications to the pharmacy.  Ihor Austin. Benjamin Stain, M.D.

## 2010-07-21 ENCOUNTER — Other Ambulatory Visit: Payer: Self-pay | Admitting: Sports Medicine

## 2010-07-21 ENCOUNTER — Telehealth: Payer: Self-pay | Admitting: *Deleted

## 2010-07-21 DIAGNOSIS — E785 Hyperlipidemia, unspecified: Secondary | ICD-10-CM

## 2010-07-21 DIAGNOSIS — M79605 Pain in left leg: Secondary | ICD-10-CM

## 2010-07-21 DIAGNOSIS — M79604 Pain in right leg: Secondary | ICD-10-CM

## 2010-07-21 MED ORDER — GEMFIBROZIL 600 MG PO TABS: 600.0000 mg | ORAL_TABLET | Freq: Two times a day (BID) | ORAL | Status: DC

## 2010-07-21 NOTE — Telephone Encounter (Signed)
Message copied by Jimmy Footman on Fri Jul 21, 2010  1:53 PM ------      Message from: [name]      Created: Fri Jul 21, 2010  9:00 AM       Pls call and let pt know I called in a new fibrate medication for her cholesterol, should be ~$20 a month.      Ihor Austin. Benjamin Stain, M.D.

## 2010-07-21 NOTE — Telephone Encounter (Signed)
Message copied by Jimmy Footman on Fri Jul 21, 2010  2:00 PM ------      Message from: [name]      Created: Fri Jul 21, 2010  9:00 AM       Pls call and let pt know I called in a new fibrate medication for her cholesterol, should be ~$20 a month.      Ihor Austin. Benjamin Stain, M.D.

## 2010-07-21 NOTE — Telephone Encounter (Signed)
LM informing patient.

## 2010-07-21 NOTE — Assessment & Plan Note (Addendum)
Pt unable to afford fenofibrate, will try gemfibrozil.

## 2010-09-29 ENCOUNTER — Inpatient Hospital Stay (INDEPENDENT_AMBULATORY_CARE_PROVIDER_SITE_OTHER)
Admission: RE | Admit: 2010-09-29 | Discharge: 2010-09-29 | Disposition: A | Discharge status: Home / Self Care | Payer: Self-pay | Source: Ambulatory Visit | Attending: Family Medicine | Admitting: Family Medicine

## 2010-09-29 DIAGNOSIS — K5289 Other specified noninfective gastroenteritis and colitis: Secondary | ICD-10-CM

## 2010-09-29 DIAGNOSIS — J019 Acute sinusitis, unspecified: Secondary | ICD-10-CM

## 2010-11-01 ENCOUNTER — Inpatient Hospital Stay (INDEPENDENT_AMBULATORY_CARE_PROVIDER_SITE_OTHER)
Admission: RE | Admit: 2010-11-01 | Discharge: 2010-11-01 | Disposition: A | Discharge status: Home / Self Care | Payer: Self-pay | Source: Ambulatory Visit | Attending: Family Medicine | Admitting: Family Medicine

## 2010-11-01 DIAGNOSIS — G44209 Tension-type headache, unspecified, not intractable: Secondary | ICD-10-CM

## 2010-11-14 ENCOUNTER — Ambulatory Visit (INDEPENDENT_AMBULATORY_CARE_PROVIDER_SITE_OTHER): Payer: Self-pay | Admitting: Family Medicine

## 2010-11-14 ENCOUNTER — Encounter: Payer: Self-pay | Admitting: Family Medicine

## 2010-11-14 DIAGNOSIS — R51 Headache: Secondary | ICD-10-CM | POA: Insufficient documentation

## 2010-11-14 DIAGNOSIS — H6120 Impacted cerumen, unspecified ear: Secondary | ICD-10-CM | POA: Insufficient documentation

## 2010-11-14 DIAGNOSIS — R519 Headache, unspecified: Secondary | ICD-10-CM | POA: Insufficient documentation

## 2010-11-14 DIAGNOSIS — J309 Allergic rhinitis, unspecified: Secondary | ICD-10-CM | POA: Insufficient documentation

## 2010-11-14 DIAGNOSIS — Z0000 Encounter for general adult medical examination without abnormal findings: Secondary | ICD-10-CM

## 2010-11-14 MED ORDER — FLUTICASONE PROPIONATE 50 MCG/ACT NA SUSP: 2.0000 | Freq: Every day | NASAL | Status: DC

## 2010-11-14 NOTE — Progress Notes (Signed)
  Subjective:    Patient ID: Tasha Ortiz, female    DOB: 1955/05/06, 55 y.o.   MRN: [id number]  HPI 1.  Headache:  Seen at Urgent Care at beginning of month for this.  Has had occasional headaches in past, described as tension-like headaches.  Had sinus infection last year, states head feels congested similar to then.  At Urgent Care, given Tramadol, which helps, but still feels congested.  Some nasal drainage at night, difficulty breathing due to congestion.  Sister diagnosed with brain tumor about 1 month ago as well.  Feels some added stress to her life due to sister's diagnosis.  Otherwise no cough, no fevers or chills.     Review of Systems See HPI above for review of systems.       Objective:   Physical Exam Gen:  Alert, cooperative patient who appears stated age in no acute distress.  Vital signs reviewed. Eyes:  PERRL.  EOMI.  Fundoscopy normal BL Nose:  Nasal turbinates very swollen and boggy appearing bilaterally, almost touching nasal septum. Ears:  Cerumen impaction bilaterally.  After irrigation, canal minimally irritated.  TMs clear BL Mouth:  MMM Neck: No masses or thyromegaly or limitation in range of motion.  No cervical lymphadenopathy. MSK:  Cervical neck tenderness and trigger point noted at C6 in trapezius on Left side.  Right side WNL Cardiac:  Regular rate and rhythm without murmur auscultated.  Good S1/S2. Pulm:  Clear to auscultation bilaterally with good air movement.  No wheezes or rales noted.          Assessment & Plan:

## 2010-11-14 NOTE — Assessment & Plan Note (Signed)
Likely secondary to extensive nasal congestion.  Possibly secondary to cerumen impaction or neck spasm.  Recommend relaxation techniques, massage for neck spasm.  To return if no relief.

## 2010-11-14 NOTE — Patient Instructions (Addendum)
Use the nasal spray you have tonight, tomorrow AM, tomorrow PM, and again on Thursday AM.  Then don't use this for several days at least.   I will prescribe you a new nasal medication that you should use every day, two sprays in each nostril.   This will take about a week to start working, so don't give up on it.   Keep using this through at least the summer to help with the nasal congestion. The other things that help are over the counter medications:  Claritin, Allegra, or Zyrtec.  Find one that works for you and stick with it.    The [facility] card should be able to pay for the stool tests.  I will have the nurses explain it to you.    Otherwise, come back and see me in 2-3 months so we can go over the rest of your medicines.

## 2010-11-14 NOTE — Assessment & Plan Note (Addendum)
Treat with oxymetazoline x 2 days Would like to treat with Flonase -- however she is self pay and these are still expensive medications.   Also add OTC H1 blocker.

## 2010-11-14 NOTE — Assessment & Plan Note (Signed)
Irrigation today 

## 2010-11-24 LAB — POC HEMOCCULT BLD/STL (HOME/3-CARD/SCREEN): Card #2 Fecal Occult Blod, POC: NEGATIVE

## 2010-11-24 NOTE — Progress Notes (Signed)
Addended by: Jennette Bill on: 11/24/2010 02:22 PM   Modules accepted: Orders

## 2011-04-04 ENCOUNTER — Encounter: Payer: Self-pay | Admitting: Family Medicine

## 2011-04-04 ENCOUNTER — Ambulatory Visit (INDEPENDENT_AMBULATORY_CARE_PROVIDER_SITE_OTHER): Payer: Self-pay | Admitting: Family Medicine

## 2011-04-04 VITALS — BP 128/81 | HR 66 | Temp 98.0°F | Ht 63.0 in | Wt 179.0 lb

## 2011-04-04 DIAGNOSIS — M79609 Pain in unspecified limb: Secondary | ICD-10-CM

## 2011-04-04 DIAGNOSIS — I10 Essential (primary) hypertension: Secondary | ICD-10-CM

## 2011-04-04 DIAGNOSIS — E785 Hyperlipidemia, unspecified: Secondary | ICD-10-CM

## 2011-04-04 DIAGNOSIS — M79604 Pain in right leg: Secondary | ICD-10-CM

## 2011-04-04 MED ORDER — LISINOPRIL-HYDROCHLOROTHIAZIDE 20-25 MG PO TABS: 1.0000 | ORAL_TABLET | Freq: Every day | ORAL | Status: DC

## 2011-04-04 NOTE — Patient Instructions (Signed)
You can stop taking the Cholesterol medicine.   Make a lab appt on your way out so we can check some blood tests.  You can do this anytime in the next week or so.  For your legs, keep taking the Ibuprofen and cream.    I sent in a refill for the blood pressure medication.

## 2011-04-04 NOTE — Progress Notes (Signed)
  Subjective:    Patient ID: Tasha Ortiz, female    DOB: Jul 18, 1955, 56 y.o.   MRN: [id number]  HPI 56 yo F with Bilateral leg pains.  States that she feels aching in calves and thighs, mostly at night.  History of this when started on statin, eventually taken off statin and started on Gemfibrozil about 6 months ago.  Pain recurred 1 month ago.  Stopped Lopid and pain improved, restarted b/c she didn't want to be off cholesterol medication and pain recurred.  Denies any abdominal pain, nausea, vomiting, or pain in other extremities.  No weakness.    Of note, sister has brain tumor and starting in Thanksgiving, about 1 month ago, she had acute worsening.  Patient states she is worried about upset because of this.     Review of Systems See HPI above for review of systems.       Objective:   Physical Exam Gen:  Alert, cooperative patient who appears stated age in no acute distress.  Vital signs reviewed. Ears:  Minimal cerumen noted BL, no impaction.  Cardiac:  Regular rate and rhythm without murmur auscultated.  Good S1/S2. Pulm:  Clear to auscultation bilaterally with good air movement.  No wheezes or rales noted.   Abd:  Soft/nondistended/nontender.  Good bowel sounds throughout all four quadrants.  No masses noted.  Ext:  No clubbing/cyanosis/erythema.  No edema noted bilateral lower extremities.   MSK:  No tenderness or pain noted BL LEs.   Neuro:  No focal deficits.  Patient ambulates well without limp.  Skin:  Hirsutism noted upper lip and chin      Assessment & Plan:

## 2011-04-04 NOTE — Assessment & Plan Note (Signed)
Checking lipid panel at next lab draw.

## 2011-04-04 NOTE — Assessment & Plan Note (Signed)
At goal, refilled medications today.

## 2011-04-04 NOTE — Assessment & Plan Note (Addendum)
Worsening. Questionable etiology, wonder if cramps from low potassium.  Doubt myalgias as not on statin for past 6 months or so.  Doubtful secondary to Gemfibrozil usage. Check CPK, TSH (some hirsutism on exam), CMET (noted minor bump in LFTs last CMET in March), lipid panel at next lab visit.   FU in 1-2 months depending on lab results.   As noted in HPI, patient under significant stressor with sick sister whom she is very close with.  Symptom initiation roughly coincided with sister's decline.

## 2011-04-08 ENCOUNTER — Emergency Department (HOSPITAL_COMMUNITY)
Admission: EM | Admit: 2011-04-08 | Discharge: 2011-04-09 | Disposition: A | Discharge status: Home / Self Care | Payer: Self-pay | Attending: Emergency Medicine | Admitting: Emergency Medicine

## 2011-04-08 ENCOUNTER — Encounter (HOSPITAL_COMMUNITY): Payer: Self-pay | Admitting: Emergency Medicine

## 2011-04-08 DIAGNOSIS — I10 Essential (primary) hypertension: Secondary | ICD-10-CM | POA: Insufficient documentation

## 2011-04-08 DIAGNOSIS — R42 Dizziness and giddiness: Secondary | ICD-10-CM | POA: Insufficient documentation

## 2011-04-08 DIAGNOSIS — E7800 Pure hypercholesterolemia, unspecified: Secondary | ICD-10-CM | POA: Insufficient documentation

## 2011-04-08 DIAGNOSIS — Z79899 Other long term (current) drug therapy: Secondary | ICD-10-CM | POA: Insufficient documentation

## 2011-04-08 DIAGNOSIS — K117 Disturbances of salivary secretion: Secondary | ICD-10-CM | POA: Insufficient documentation

## 2011-04-08 DIAGNOSIS — E869 Volume depletion, unspecified: Secondary | ICD-10-CM | POA: Insufficient documentation

## 2011-04-08 DIAGNOSIS — R296 Repeated falls: Secondary | ICD-10-CM | POA: Insufficient documentation

## 2011-04-08 HISTORY — DX: Essential (primary) hypertension: I10

## 2011-04-08 HISTORY — DX: Pure hypercholesterolemia, unspecified: E78.00

## 2011-04-08 NOTE — ED Notes (Signed)
PT. FELT DIZZZY AND FELL AT BATHROOM AT HOME THIS EVENING , NO LOC , REPORTS PAIN AT BILATERAL KNEES / SHIN AND UPPER BACK PAIN . AMBULATORY.

## 2011-04-09 ENCOUNTER — Other Ambulatory Visit: Payer: Self-pay

## 2011-04-09 LAB — URINE MICROSCOPIC-ADD ON

## 2011-04-09 LAB — URINALYSIS, ROUTINE W REFLEX MICROSCOPIC
Bilirubin Urine: NEGATIVE
Glucose, UA: NEGATIVE mg/dL
Ketones, ur: 15 mg/dL — AB
Leukocytes, UA: NEGATIVE
Nitrite: NEGATIVE
Protein, ur: NEGATIVE mg/dL
Specific Gravity, Urine: 1.017 (ref 1.005–1.030)
Urobilinogen, UA: 1 mg/dL (ref 0.0–1.0)
pH: 5.5 (ref 5.0–8.0)

## 2011-04-09 LAB — BASIC METABOLIC PANEL
BUN: 11 mg/dL (ref 6–23)
CO2: 26 mEq/L (ref 19–32)
Calcium: 10.1 mg/dL (ref 8.4–10.5)
Chloride: 94 mEq/L — ABNORMAL LOW (ref 96–112)
Creatinine, Ser: 1.01 mg/dL (ref 0.50–1.10)
GFR calc Af Amer: 72 mL/min — ABNORMAL LOW (ref >90)
GFR calc non Af Amer: 62 mL/min — ABNORMAL LOW (ref >90)
Glucose, Bld: 113 mg/dL — ABNORMAL HIGH (ref 70–99)
Potassium: 3.5 mEq/L (ref 3.5–5.1)
Sodium: 131 mEq/L — ABNORMAL LOW (ref 135–145)

## 2011-04-09 LAB — CBC
HCT: 39.3 % (ref 36.0–46.0)
Hemoglobin: 13.8 g/dL (ref 12.0–15.0)
MCH: 29.9 pg (ref 26.0–34.0)
MCHC: 35.1 g/dL (ref 30.0–36.0)
MCV: 85.1 fL (ref 78.0–100.0)
Platelets: 278 10*3/uL (ref 150–400)
RBC: 4.62 MIL/uL (ref 3.87–5.11)
RDW: 12.9 % (ref 11.5–15.5)
WBC: 5.1 10*3/uL (ref 4.0–10.5)

## 2011-04-09 LAB — PREGNANCY, URINE: Preg Test, Ur: NEGATIVE

## 2011-04-09 LAB — TROPONIN I: Troponin I: <0.3 ng/mL (ref <0.30)

## 2011-04-09 MED ORDER — SODIUM CHLORIDE 0.9 % IV BOLUS (SEPSIS)
1000.0000 mL | Freq: Once | INTRAVENOUS | Status: AC
  Administered 2011-04-09: 1000 mL via INTRAVENOUS

## 2011-04-09 MED ORDER — OXYCODONE-ACETAMINOPHEN 5-325 MG PO TABS: 1.0000 | ORAL_TABLET | ORAL | Status: DC | PRN

## 2011-04-09 MED ORDER — CYCLOBENZAPRINE HCL 10 MG PO TABS: 10.0000 mg | ORAL_TABLET | Freq: Two times a day (BID) | ORAL | Status: DC | PRN

## 2011-04-09 MED ORDER — ONDANSETRON HCL 4 MG PO TABS: 4.0000 mg | ORAL_TABLET | Freq: Four times a day (QID) | ORAL | Status: DC

## 2011-04-09 NOTE — ED Provider Notes (Signed)
History     CSN: [id number]  Arrival date & time 04/08/11  2250   First MD Initiated Contact with Patient 04/08/11 2336      Chief Complaint  Patient presents with  . Fall    Patient is a 56 y.o. female presenting with fall. The history is provided by the patient.  Fall   the patient reports she was going to the bathroom and developed lightheadedness without palpitations.  She had a near-syncopal episode.  She had no syncope.  She reports falling.  She reports no difficulty ambulating at this time.  She reports she's been on a "fast" for approximately 5 days in which she only is eating vegetables and drinking water but she is no longer eating meat or other foods.  She denies melena and hematochezia.  She denies vomiting and nausea.  She denies abdominal pain.  She denies chest pain and palpitations.  She denies shortness of breath.  She's had no recent illness.  She denies fevers and chills.  She denies dysuria and urinary frequency.  She denies hematuria.  Past Medical History  Diagnosis Date  . Hypertension   . Hypercholesteremia     History reviewed. No pertinent past surgical history.  No family history on file.  History  Substance Use Topics  . Smoking status: Never Smoker   . Smokeless tobacco: Former Neurosurgeon    Types: Chew  . Alcohol Use: No    OB History    Grav Para Term Preterm Abortions TAB SAB Ect Mult Living                  Review of Systems  All other systems reviewed and are negative.    Allergies  Review of patient's allergies indicates no known allergies.  Home Medications   Current Outpatient Rx  Name Route Sig Dispense Refill  . IBUPROFEN 200 MG PO TABS Oral Take 200 mg by mouth daily as needed. For pain.     Marland Kitchen LISINOPRIL-HYDROCHLOROTHIAZIDE 20-25 MG PO TABS Oral Take 1 tablet by mouth daily.      Marland Kitchen OVER THE COUNTER MEDICATION Nasal Place 2 sprays into the nose daily as needed. For stuffy nose! Store Brand nasal spray       BP 102/64  Pulse  60  Temp(Src) 98.7 F (37.1 C) (Oral)  Resp 18  SpO2 97%  LMP 03/08/2011  Physical Exam  Nursing note and vitals reviewed. Constitutional: She is oriented to person, place, and time. She appears well-developed and well-nourished. No distress.  HENT:  Head: Normocephalic and atraumatic.       Dry mucous membranes  Eyes: EOM are normal.  Neck: Normal range of motion.  Cardiovascular: Normal rate, regular rhythm and normal heart sounds.   Pulmonary/Chest: Effort normal and breath sounds normal.  Abdominal: Soft. She exhibits no distension. There is no tenderness.  Musculoskeletal: Normal range of motion.  Neurological: She is alert and oriented to person, place, and time.  Skin: Skin is warm and dry.  Psychiatric: She has a normal mood and affect. Judgment normal.    ED Course  Procedures (including critical care time)  Labs Reviewed  BASIC METABOLIC PANEL - Abnormal; Notable for the following:    Sodium 131 (*)    Chloride 94 (*)    Glucose, Bld 113 (*)    GFR calc non Af Amer 62 (*)    GFR calc Af Amer 72 (*)    All other components within normal limits  URINALYSIS, ROUTINE  W REFLEX MICROSCOPIC - Abnormal; Notable for the following:    APPearance CLOUDY (*)    Hgb urine dipstick LARGE (*)    Ketones, ur 15 (*)    All other components within normal limits  URINE MICROSCOPIC-ADD ON - Abnormal; Notable for the following:    Squamous Epithelial / LPF MANY (*)    Bacteria, UA MANY (*)    Casts HYALINE CASTS (*)    All other components within normal limits  CBC  TROPONIN I  PREGNANCY, URINE   No results found.   1. Volume depletion       MDM  Patient feels much better after IV fluids.  Her vital signs otherwise normal.  Her labs are normal.  She is well appearing.  Her EKG is normal.  Discharge home with instructions to continue to keep herself hydrated.        Lyanne Co, MD 04/09/11 [phone]

## 2011-04-11 ENCOUNTER — Other Ambulatory Visit: Payer: Self-pay

## 2011-04-11 DIAGNOSIS — E785 Hyperlipidemia, unspecified: Secondary | ICD-10-CM

## 2011-04-11 DIAGNOSIS — M79605 Pain in left leg: Secondary | ICD-10-CM

## 2011-04-11 DIAGNOSIS — M79604 Pain in right leg: Secondary | ICD-10-CM

## 2011-04-11 LAB — COMPREHENSIVE METABOLIC PANEL
ALT: 14 U/L (ref 0–35)
AST: 17 U/L (ref 0–37)
Albumin: 4.3 g/dL (ref 3.5–5.2)
Alkaline Phosphatase: 42 U/L (ref 39–117)
BUN: 10 mg/dL (ref 6–23)
CO2: 25 mEq/L (ref 19–32)
Calcium: 9.9 mg/dL (ref 8.4–10.5)
Chloride: 103 mEq/L (ref 96–112)
Creat: 1.01 mg/dL (ref 0.50–1.10)
Glucose, Bld: 101 mg/dL — ABNORMAL HIGH (ref 70–99)
Potassium: 4.1 mEq/L (ref 3.5–5.3)
Sodium: 139 mEq/L (ref 135–145)
Total Bilirubin: 0.3 mg/dL (ref 0.3–1.2)
Total Protein: 7.3 g/dL (ref 6.0–8.3)

## 2011-04-11 LAB — CK: Total CK: 67 U/L (ref 7–177)

## 2011-04-11 LAB — CBC
HCT: 38.6 % (ref 36.0–46.0)
Hemoglobin: 12.6 g/dL (ref 12.0–15.0)
MCH: 28.8 pg (ref 26.0–34.0)
MCHC: 32.6 g/dL (ref 30.0–36.0)
MCV: 88.3 fL (ref 78.0–100.0)
Platelets: 302 10*3/uL (ref 150–400)
RBC: 4.37 MIL/uL (ref 3.87–5.11)
RDW: 13.1 % (ref 11.5–15.5)
WBC: 4.4 10*3/uL (ref 4.0–10.5)

## 2011-04-11 LAB — TSH: TSH: 2.43 u[IU]/mL (ref 0.350–4.500)

## 2011-04-11 NOTE — Progress Notes (Signed)
Labs done today Donivin Wirt 

## 2011-04-12 LAB — LIPID PANEL
Cholesterol: 223 mg/dL — ABNORMAL HIGH (ref 0–200)
HDL: 35 mg/dL — ABNORMAL LOW (ref >39)
LDL Cholesterol: 170 mg/dL — ABNORMAL HIGH (ref 0–99)
Total CHOL/HDL Ratio: 6.4 Ratio
Triglycerides: 90 mg/dL (ref <150)
VLDL: 18 mg/dL (ref 0–40)

## 2011-04-13 ENCOUNTER — Encounter: Payer: Self-pay | Admitting: Family Medicine

## 2011-11-13 ENCOUNTER — Encounter (HOSPITAL_COMMUNITY): Payer: Self-pay | Admitting: Emergency Medicine

## 2011-11-13 ENCOUNTER — Emergency Department (HOSPITAL_COMMUNITY)
Admission: EM | Admit: 2011-11-13 | Discharge: 2011-11-13 | Disposition: A | Discharge status: Home / Self Care | Payer: Self-pay | Attending: Emergency Medicine | Admitting: Emergency Medicine

## 2011-11-13 ENCOUNTER — Emergency Department (HOSPITAL_COMMUNITY): Payer: Self-pay

## 2011-11-13 DIAGNOSIS — R109 Unspecified abdominal pain: Secondary | ICD-10-CM | POA: Insufficient documentation

## 2011-11-13 DIAGNOSIS — Z79899 Other long term (current) drug therapy: Secondary | ICD-10-CM | POA: Insufficient documentation

## 2011-11-13 DIAGNOSIS — I10 Essential (primary) hypertension: Secondary | ICD-10-CM | POA: Insufficient documentation

## 2011-11-13 LAB — COMPREHENSIVE METABOLIC PANEL
ALT: 17 U/L (ref 0–35)
AST: 19 U/L (ref 0–37)
Albumin: 3.5 g/dL (ref 3.5–5.2)
Alkaline Phosphatase: 45 U/L (ref 39–117)
BUN: 13 mg/dL (ref 6–23)
CO2: 24 mEq/L (ref 19–32)
Calcium: 9.2 mg/dL (ref 8.4–10.5)
Chloride: 101 mEq/L (ref 96–112)
Creatinine, Ser: 0.97 mg/dL (ref 0.50–1.10)
GFR calc Af Amer: 75 mL/min — ABNORMAL LOW (ref >90)
GFR calc non Af Amer: 65 mL/min — ABNORMAL LOW (ref >90)
Glucose, Bld: 99 mg/dL (ref 70–99)
Potassium: 3.4 mEq/L — ABNORMAL LOW (ref 3.5–5.1)
Sodium: 137 mEq/L (ref 135–145)
Total Bilirubin: 0.1 mg/dL — ABNORMAL LOW (ref 0.3–1.2)
Total Protein: 7 g/dL (ref 6.0–8.3)

## 2011-11-13 LAB — CBC
HCT: 36.2 % (ref 36.0–46.0)
Hemoglobin: 12.2 g/dL (ref 12.0–15.0)
MCH: 28.6 pg (ref 26.0–34.0)
MCHC: 33.7 g/dL (ref 30.0–36.0)
MCV: 85 fL (ref 78.0–100.0)
Platelets: 276 10*3/uL (ref 150–400)
RBC: 4.26 MIL/uL (ref 3.87–5.11)
RDW: 13.2 % (ref 11.5–15.5)
WBC: 4.5 10*3/uL (ref 4.0–10.5)

## 2011-11-13 LAB — LIPASE, BLOOD: Lipase: 54 U/L (ref 11–59)

## 2011-11-13 MED ORDER — FAMOTIDINE 20 MG PO TABS
20.0000 mg | ORAL_TABLET | Freq: Once | ORAL | Status: AC
  Administered 2011-11-13: 20 mg via ORAL
  Filled 2011-11-13: qty 1

## 2011-11-13 MED ORDER — GI COCKTAIL ~~LOC~~
30.0000 mL | Freq: Once | ORAL | Status: AC
  Administered 2011-11-13: 30 mL via ORAL
  Filled 2011-11-13: qty 30

## 2011-11-13 MED ORDER — FAMOTIDINE 20 MG PO TABS: 20.0000 mg | ORAL_TABLET | Freq: Two times a day (BID) | ORAL | Status: DC

## 2011-11-13 NOTE — ED Provider Notes (Signed)
History     CSN: [id number]  Arrival date & time 11/13/11  0730   First MD Initiated Contact with Patient 11/13/11 [phone]      Chief Complaint  Patient presents with  . Abdominal Pain    (Consider location/radiation/quality/duration/timing/severity/associated sxs/prior treatment) Patient is a 56 y.o. female presenting with abdominal pain. The history is provided by the patient.  Abdominal Pain The primary symptoms of the illness include abdominal pain. The primary symptoms of the illness do not include fever, shortness of breath, vomiting, diarrhea or dysuria.  Symptoms associated with the illness do not include chills, constipation or back pain.  pt c/o epigastric/ruq pain for past 2-3 weeks. Intermittent, no specific exacerbating or alleviating factors w exception that pain seems worse at night when lies flat. No radiation of pain. When present is constant, dull. No hx pud, gastritis, pancreatitis, or gallstones. No fam hx gallstones. No hx reflux. Denies any chest pain or discomfort. No assoc nv, diaphoresis, or sob. Having normal bms. No abd distension. No cough or uri c/o. No fever or chills. No gu c/o.     Past Medical History  Diagnosis Date  . Hypertension   . Hypercholesteremia     taken off chol meds Dec 2012    Past Surgical History  Procedure Date  . Cesarean section 25 years ago    History reviewed. No pertinent family history.  History  Substance Use Topics  . Smoking status: Never Smoker   . Smokeless tobacco: Former Neurosurgeon    Types: Chew  . Alcohol Use: No    OB History    Grav Para Term Preterm Abortions TAB SAB Ect Mult Living                  Review of Systems  Constitutional: Negative for fever and chills.  HENT: Negative for neck pain.   Eyes: Negative for redness.  Respiratory: Negative for cough and shortness of breath.   Cardiovascular: Negative for chest pain.  Gastrointestinal: Positive for abdominal pain. Negative for vomiting, diarrhea  and constipation.  Genitourinary: Negative for dysuria and flank pain.  Musculoskeletal: Negative for back pain.  Skin: Negative for rash.  Neurological: Negative for headaches.  Hematological: Does not bruise/bleed easily.  Psychiatric/Behavioral: Negative for confusion.    Allergies  Review of patient's allergies indicates no known allergies.  Home Medications   Current Outpatient Rx  Name Route Sig Dispense Refill  . IBUPROFEN 200 MG PO TABS Oral Take 200 mg by mouth daily as needed. For pain.     Marland Kitchen LISINOPRIL-HYDROCHLOROTHIAZIDE 20-25 MG PO TABS Oral Take 1 tablet by mouth daily.      . ADULT MULTIVITAMIN W/MINERALS CH Oral Take 1 tablet by mouth daily.      BP 136/98  Pulse 62  Temp 98.7 F (37.1 C) (Oral)  Resp 16  SpO2 99%  LMP 11/05/2011  Physical Exam  Nursing note and vitals reviewed. Constitutional: She appears well-developed and well-nourished. No distress.  HENT:  Mouth/Throat: Oropharynx is clear and moist.  Eyes: Conjunctivae are normal. No scleral icterus.  Neck: Neck supple. No tracheal deviation present.  Cardiovascular: Normal rate, regular rhythm, normal heart sounds and intact distal pulses.   Pulmonary/Chest: Effort normal and breath sounds normal. No respiratory distress.  Abdominal: Soft. Normal appearance and bowel sounds are normal. She exhibits no distension and no mass. There is no tenderness. There is no rebound and no guarding.  Genitourinary:       No cva tenderness  Musculoskeletal: She exhibits no edema.  Neurological: She is alert.  Skin: Skin is warm and dry. No rash noted.  Psychiatric: She has a normal mood and affect.    ED Course  Procedures (including critical care time)  Results for orders placed during the hospital encounter of 11/13/11  CBC      Component Value Range   WBC 4.5  4.0 - 10.5 K/uL   RBC 4.26  3.87 - 5.11 MIL/uL   Hemoglobin 12.2  12.0 - 15.0 g/dL   HCT 78.2  95.6 - 21.3 %   MCV 85.0  78.0 - 100.0 fL    MCH 28.6  26.0 - 34.0 pg   MCHC 33.7  30.0 - 36.0 g/dL   RDW 08.6  57.8 - 46.9 %   Platelets 276  150 - 400 K/uL  COMPREHENSIVE METABOLIC PANEL      Component Value Range   Sodium 137  135 - 145 mEq/L   Potassium 3.4 (*) 3.5 - 5.1 mEq/L   Chloride 101  96 - 112 mEq/L   CO2 24  19 - 32 mEq/L   Glucose, Bld 99  70 - 99 mg/dL   BUN 13  6 - 23 mg/dL   Creatinine, Ser 6.29  0.50 - 1.10 mg/dL   Calcium 9.2  8.4 - 52.8 mg/dL   Total Protein 7.0  6.0 - 8.3 g/dL   Albumin 3.5  3.5 - 5.2 g/dL   AST 19  0 - 37 U/L   ALT 17  0 - 35 U/L   Alkaline Phosphatase 45  39 - 117 U/L   Total Bilirubin 0.1 (*) 0.3 - 1.2 mg/dL   GFR calc non Af Amer 65 (*) >90 mL/min   GFR calc Af Amer 75 (*) >90 mL/min  LIPASE, BLOOD      Component Value Range   Lipase 54  11 - 59 U/L   US Abdomen Complete  11/13/2011  *RADIOLOGY REPORT*  Clinical Data:  Abdominal pain  COMPLETE ABDOMINAL ULTRASOUND  Comparison:  None.  Findings:  Gallbladder:  No gallstones, gallbladder wall thickening, or pericholecystic fluid.  Common bile duct:  4.4 mm  Liver:  No focal lesion identified.  Within normal limits in parenchymal echogenicity.  IVC:  Appears normal.  Pancreas:  No focal abnormality seen.  Spleen:  6.4 cm.  Right Kidney:  9.5 cm.  Normal  Left Kidney:  9.7 cm.  Normal  Abdominal aorta:  No aneurysm identified.  IMPRESSION: Negative abdominal ultrasound.  Original Report Authenticated By: Camelia Phenes, M.D.       MDM  Labs. U/s.   Pepcid, gi cocktail, pt comfortable on recheck abd soft nt.  Pt states has appt w pcp in 6 days from today, will follow up then.       Suzi Roots, MD 11/13/11 [phone]

## 2011-11-13 NOTE — ED Notes (Addendum)
abd pain started two weeks ago intermittent, became constant 2-3 nights ago. Pain in RUQ, radiates to back. No hx gallbladder problems,.No vomiting or diarrhea

## 2011-11-19 ENCOUNTER — Ambulatory Visit (INDEPENDENT_AMBULATORY_CARE_PROVIDER_SITE_OTHER): Payer: Self-pay | Admitting: Family Medicine

## 2011-11-19 ENCOUNTER — Encounter: Payer: Self-pay | Admitting: Family Medicine

## 2011-11-19 VITALS — BP 108/64 | HR 69 | Temp 98.9°F | Ht 63.0 in | Wt 175.3 lb

## 2011-11-19 DIAGNOSIS — K219 Gastro-esophageal reflux disease without esophagitis: Secondary | ICD-10-CM

## 2011-11-19 DIAGNOSIS — I10 Essential (primary) hypertension: Secondary | ICD-10-CM

## 2011-11-19 DIAGNOSIS — E8881 Metabolic syndrome: Secondary | ICD-10-CM

## 2011-11-19 DIAGNOSIS — Z1239 Encounter for other screening for malignant neoplasm of breast: Secondary | ICD-10-CM

## 2011-11-19 DIAGNOSIS — E785 Hyperlipidemia, unspecified: Secondary | ICD-10-CM

## 2011-11-19 MED ORDER — PRAVASTATIN SODIUM 40 MG PO TABS: 40.0000 mg | ORAL_TABLET | Freq: Every day | ORAL | Status: DC

## 2011-11-19 MED ORDER — FAMOTIDINE 20 MG PO TABS: 20.0000 mg | ORAL_TABLET | Freq: Two times a day (BID) | ORAL | Status: DC

## 2011-11-19 MED ORDER — LISINOPRIL-HYDROCHLOROTHIAZIDE 20-25 MG PO TABS: 1.0000 | ORAL_TABLET | Freq: Every day | ORAL | Status: DC

## 2011-11-19 NOTE — Assessment & Plan Note (Signed)
Last Lipid Profile was TC 223, LDL 170, HDL 35, and TG 90.  Was previously on Lovastatin and Lopid but taken off b/c of myalgias.  However, pt is agreeable to trying Pravachol since it is metabolized by the body a little differently.  Will recheck in 6 weeks and get Lipid Profile before the visit.  Would like to get LDL < 130 as pt has 2 RF and < 205 10 yr risk.

## 2011-11-19 NOTE — Patient Instructions (Signed)

## 2011-11-19 NOTE — Assessment & Plan Note (Signed)
Discussed ways to improve weight loss and overall mood with exercise and diet.  Will see how she does in 6 weeks.

## 2011-11-19 NOTE — Assessment & Plan Note (Signed)
Well controlled  Continue current regiment  

## 2011-11-19 NOTE — Progress Notes (Signed)
Tasha Ortiz is a 56 y.o. who presents today for hospital f/u from 11/13/11 for abdominal pain. In the ED she had labs including lipase, cbc, and cmp, all of which did not show acute abnormalities as well as a RUQ [place] which did not show acute inflammation or gallstones.  Pt states that she was at work and started to have some abdominal pain after moving furniture and thought it was related to that.  During the rest of the day, she continued to have abdominal pain intermittent and continued to think this was related to her straining a muscle.  However, during the later stages of the day, she started to have continued abdominal pain and decided to go to the ED.  Pt denies she had N/V/D or pain exacerbated by eating and did not have BRBPR, radiating pain to the RUQ or to her CVA region. Also, pt did not have dysuria, hematuria, increased frequency, fever, chills, sweats.  She was dx with GERD and given pepcid bid and d/c home.  Over the last several days, she has continued to take her pepcid bid as directed and has not had any abdominal pain to this point.  Still not having N/V/D or BRBPR, dysuria, hematemesis, melena, or colicky abdominal pain to this point.  No fever, chills, sweats.    HTN very well controlled on lisinopril/HCTZ 20/25 mg at 108/64 today.    Hyperlipidemia not well controlled, not currently on medications (was previously on Lovastatin and Lopid, d/c due to SE).  Last lipid draw was 04/11/11 with LDL of 170, HDL 35, TC 223, and TG 90.      Past Medical History  Diagnosis Date  . Hypertension   . Hypercholesteremia     taken off chol meds Dec 2012    History   Social History  . Marital Status: Legally Separated    Spouse Name: N/A    Number of Children: N/A  . Years of Education: N/A   Occupational History  . Not on file.   Social History Main Topics  . Smoking status: Never Smoker   . Smokeless tobacco: Former Neurosurgeon    Types: Chew  . Alcohol Use: No  . Drug Use: No    . Sexually Active: Not on file   Other Topics Concern  . Not on file   Social History Narrative  . No narrative on file    Family History  Problem Relation Age of Onset  . Hypertension Mother   . Diabetes Daughter     Current outpatient prescriptions:famotidine (PEPCID) 20 MG tablet, Take 1 tablet (20 mg total) by mouth 2 (two) times daily., Disp: 30 tablet, Rfl: 0;  ibuprofen (ADVIL,MOTRIN) 200 MG tablet, Take 200 mg by mouth daily as needed. For pain. , Disp: , Rfl: ;  lisinopril-hydrochlorothiazide (PRINZIDE,ZESTORETIC) 20-25 MG per tablet, Take 1 tablet by mouth daily.  , Disp: , Rfl:  Multiple Vitamin (MULTIVITAMIN WITH MINERALS) TABS, Take 1 tablet by mouth daily., Disp: , Rfl:    Physical Exam Filed Vitals:   11/19/11 1006  BP: 108/64  Pulse: 69  Temp: 98.9 F (37.2 C)    Gen: NAD, Well nourished, Well developed HEENT: PERLA, EOMI, Bass[place]/AT Neck: no JVD Cardio: RRR, No murmurs/gallops/rubs Lungs: CTA, no wheezes, rhonchi, crackles Abd: NABS, soft nontender nondistended MSK: ROM normal  Neuro: CN 2-12 intact, MS 5/5 B/L UE and LE, +2 patellar and achilles relfex b/l     Lab 11/13/11 0820  NA 137  K 3.4*  CL 101  CO2 24  BUN 13  CREATININE 0.97  CALCIUM 9.2  PROT 7.0  BILITOT 0.1*  ALKPHOS 45  ALT 17  AST 19  GLUCOSE 99    Lipase     Component Value Date/Time   LIPASE 54 11/13/2011 0820    [place] 11/13/11 - no acute abnormalities concerning for Cholecystitis or chololithiasis.

## 2011-11-19 NOTE — Assessment & Plan Note (Signed)
Will continue pepcid 20 mg bid qd for reflux.  Will check at next visit how she is doing.

## 2011-12-21 ENCOUNTER — Encounter (HOSPITAL_COMMUNITY): Payer: Self-pay

## 2011-12-21 ENCOUNTER — Emergency Department (INDEPENDENT_AMBULATORY_CARE_PROVIDER_SITE_OTHER)
Admission: EM | Admit: 2011-12-21 | Discharge: 2011-12-21 | Disposition: A | Discharge status: Home / Self Care | Payer: Self-pay | Source: Home / Self Care

## 2011-12-21 DIAGNOSIS — J069 Acute upper respiratory infection, unspecified: Secondary | ICD-10-CM

## 2011-12-21 MED ORDER — PHENYLEPHRINE-CHLORPHEN-DM 10-4-12.5 MG/5ML PO LIQD: 5.0000 mL | Freq: Once | ORAL | Status: DC

## 2011-12-21 NOTE — ED Notes (Addendum)
Call from pharmacy, asking for clarification on Rx, as the stock item they have does not have 01-04-11.5 formulation, but rather has 01-03-14 combination Per, prescriber, the stock combination is okay to dispense . Information given directly to pharmacy staff

## 2011-12-21 NOTE — ED Provider Notes (Signed)
Medical screening examination/treatment/procedure(s) were performed by non-physician practitioner and as supervising physician I was immediately available for consultation/collaboration.  Raynald Blend, MD 12/21/11 2018

## 2011-12-21 NOTE — ED Notes (Signed)
C/o body aches, headache, stuffy/runny nose, dry non productive cough, no fevers

## 2011-12-21 NOTE — ED Provider Notes (Signed)
History     CSN: [id number]  Arrival date & time 12/21/11  1537   None     Chief Complaint  Patient presents with  . URI    (Consider location/radiation/quality/duration/timing/severity/associated sxs/prior treatment) Patient is a 56 y.o. female presenting with URI.  URI The primary symptoms include fatigue, sore throat, cough, nausea and myalgias. Primary symptoms do not include fever, headaches, ear pain, swollen glands, wheezing, abdominal pain, vomiting, arthralgias or rash. The current episode started 3 to 5 days ago. This is a new problem.  Symptoms associated with the illness include congestion and rhinorrhea. The illness is not associated with chills or facial pain.    Past Medical History  Diagnosis Date  . Hypertension   . Hypercholesteremia     taken off chol meds Dec 2012    Past Surgical History  Procedure Date  . Cesarean section 25 years ago    Family History  Problem Relation Age of Onset  . Hypertension Mother   . Diabetes Daughter     History  Substance Use Topics  . Smoking status: Never Smoker   . Smokeless tobacco: Former Neurosurgeon    Types: Chew    Quit date: 11/18/1996  . Alcohol Use: No    OB History    Grav Para Term Preterm Abortions TAB SAB Ect Mult Living                  Review of Systems  Constitutional: Positive for fatigue. Negative for fever and chills.  HENT: Positive for congestion, sore throat and rhinorrhea. Negative for ear pain.   Respiratory: Positive for cough. Negative for wheezing.   Gastrointestinal: Positive for nausea. Negative for vomiting and abdominal pain.  Genitourinary: Negative.   Musculoskeletal: Positive for myalgias. Negative for arthralgias.  Skin: Negative for rash.  Neurological: Negative for dizziness and headaches.    Allergies  Review of patient's allergies indicates no known allergies.  Home Medications   Current Outpatient Rx  Name Route Sig Dispense Refill  . FAMOTIDINE 20 MG PO TABS  Oral Take 1 tablet (20 mg total) by mouth 2 (two) times daily. 60 tablet 2  . IBUPROFEN 200 MG PO TABS Oral Take 200 mg by mouth daily as needed. For pain.     Marland Kitchen LISINOPRIL-HYDROCHLOROTHIAZIDE 20-25 MG PO TABS Oral Take 1 tablet by mouth daily. 90 tablet 3  . ADULT MULTIVITAMIN W/MINERALS CH Oral Take 1 tablet by mouth daily.    Marland Kitchen PRAVASTATIN SODIUM 40 MG PO TABS Oral Take 1 tablet (40 mg total) by mouth daily. 90 tablet 3  . PHENYLEPHRINE-CHLORPHEN-DM 01-04-11.5 MG/5ML PO LIQD Oral Take 5 mLs by mouth once. 1 tsp q 4h prn cough and cold 120 mL 0    BP 133/88  Pulse 58  Temp 98.5 F (36.9 C) (Oral)  Resp 16  SpO2 100%  LMP 11/01/2011  Physical Exam  Constitutional: She is oriented to person, place, and time. She appears well-developed and well-nourished. No distress.  HENT:  Left Ear: External ear normal.       OP. with clear PND, and mild injection, no exudates or swelling  Eyes: EOM are normal. Pupils are equal, round, and reactive to light. Left eye exhibits no discharge.  Neck: Normal range of motion. Neck supple.  Cardiovascular: Normal rate and normal heart sounds.   Pulmonary/Chest: Effort normal. No respiratory distress. She has no wheezes.  Abdominal: Soft. There is no tenderness.  Musculoskeletal: Normal range of motion.  Lymphadenopathy:  She has no cervical adenopathy.  Neurological: She is alert and oriented to person, place, and time.  Skin: Skin is warm and dry.    ED Course  Procedures (including critical care time)  Labs Reviewed - No data to display No results found.   1. URI (upper respiratory infection)       MDM  Norel CS 1 tsp q 4h prn URI instructions Plenty of fluids Rest        Hayden Rasmussen, NP 12/21/11 1735

## 2012-01-14 ENCOUNTER — Other Ambulatory Visit: Payer: Self-pay

## 2012-01-14 DIAGNOSIS — E785 Hyperlipidemia, unspecified: Secondary | ICD-10-CM

## 2012-01-14 LAB — LIPID PANEL
Cholesterol: 181 mg/dL (ref 0–200)
HDL: 36 mg/dL — ABNORMAL LOW (ref >39)
LDL Cholesterol: 124 mg/dL — ABNORMAL HIGH (ref 0–99)
Total CHOL/HDL Ratio: 5 Ratio
Triglycerides: 106 mg/dL (ref <150)
VLDL: 21 mg/dL (ref 0–40)

## 2012-01-14 NOTE — Progress Notes (Signed)
FLP DONE TODAY Desiderio Dolata 

## 2012-01-15 ENCOUNTER — Encounter: Payer: Self-pay | Admitting: Family Medicine

## 2012-03-10 ENCOUNTER — Ambulatory Visit (INDEPENDENT_AMBULATORY_CARE_PROVIDER_SITE_OTHER): Payer: No Typology Code available for payment source | Admitting: Family Medicine

## 2012-03-10 ENCOUNTER — Encounter: Payer: Self-pay | Admitting: Family Medicine

## 2012-03-10 VITALS — BP 121/68 | HR 70 | Temp 98.7°F | Ht 63.0 in | Wt 178.1 lb

## 2012-03-10 DIAGNOSIS — K219 Gastro-esophageal reflux disease without esophagitis: Secondary | ICD-10-CM

## 2012-03-10 DIAGNOSIS — J309 Allergic rhinitis, unspecified: Secondary | ICD-10-CM

## 2012-03-10 DIAGNOSIS — E785 Hyperlipidemia, unspecified: Secondary | ICD-10-CM

## 2012-03-10 LAB — COMPREHENSIVE METABOLIC PANEL
ALT: 15 U/L (ref 0–35)
AST: 19 U/L (ref 0–37)
Albumin: 4.6 g/dL (ref 3.5–5.2)
Alkaline Phosphatase: 47 U/L (ref 39–117)
BUN: 13 mg/dL (ref 6–23)
CO2: 30 mEq/L (ref 19–32)
Calcium: 10.1 mg/dL (ref 8.4–10.5)
Chloride: 102 mEq/L (ref 96–112)
Creat: 0.85 mg/dL (ref 0.50–1.10)
Glucose, Bld: 77 mg/dL (ref 70–99)
Potassium: 3.9 mEq/L (ref 3.5–5.3)
Sodium: 142 mEq/L (ref 135–145)
Total Bilirubin: 0.3 mg/dL (ref 0.3–1.2)
Total Protein: 7.4 g/dL (ref 6.0–8.3)

## 2012-03-10 LAB — LDL CHOLESTEROL, DIRECT: Direct LDL: 111 mg/dL — ABNORMAL HIGH

## 2012-03-10 NOTE — Assessment & Plan Note (Signed)
Pt stopped taking this about two months ago because she thought she didn't have any refills.  Started to have bloating and gas.  Will restart Pepcid 20 mg bid and see back in one month to see how she is doing.  Things to consider could be ovarian cancer and GI process if no improvement

## 2012-03-10 NOTE — Assessment & Plan Note (Signed)
Pt currently having rhinorrhea and sinus congestion.  Recommend Nasal Saline irrigation at bedtime and Mucinex.  May increase to nasal steroid if this is ineffective.

## 2012-03-10 NOTE — Patient Instructions (Addendum)
Tavaria, it was nice seeing you again today.  Please try using the Pepcid as directed and see if this helps with your bloating.  If not, we will discuss this when i see you back in one month.  Also, please try nasal saline and Mucinex for your sinuses and runny nose.    Thank you, Dr. Paulina Fusi

## 2012-03-10 NOTE — Progress Notes (Signed)
Tasha Ortiz is a 56 y.o. female who presents today for bloating, f/u on her hyperlipidemia, and nasal congestion  Bloating - Pt states she has had a several month history of gas and bloating.  She stopped taking her Pepcid around this time because she thought she ran out of the medication.  States she has not had Nausea or vomiting or melena,hematochezia.  Worse after meals, especially dairy and she has never had this in her life before.    Hyperlipidemia - Pt on Pravachol 40 mg qd.  Does state she started to have myalgias, intermittent but improved from her previous statin use.  These are not ADL limiting.  Drug is working well for her as brought her LDL down to 124 from 178 over two months.    Nasal Congestion - Ongoing for two weeks now, states it is related to the weather.  Has Hx of Allergic rhinitis and has tried Sudafed without much relief.  Worse with changes in humidity.  Does have rhinorrhea and sinus pressure but no sinus pain, fever, productive cough, or yellow-greenish mucous.    Past Medical History  Diagnosis Date  . Hypertension   . Hypercholesteremia     taken off chol meds Dec 2012    History  Smoking status  . Never Smoker   Smokeless tobacco  . Former Neurosurgeon  . Types: Chew  . Quit date: 11/18/1996    Family History  Problem Relation Age of Onset  . Hypertension Mother   . Diabetes Daughter     Current Outpatient Prescriptions on File Prior to Visit  Medication Sig Dispense Refill  . famotidine (PEPCID) 20 MG tablet Take 1 tablet (20 mg total) by mouth 2 (two) times daily.  60 tablet  2  . ibuprofen (ADVIL,MOTRIN) 200 MG tablet Take 200 mg by mouth daily as needed. For pain.       Marland Kitchen lisinopril-hydrochlorothiazide (PRINZIDE,ZESTORETIC) 20-25 MG per tablet Take 1 tablet by mouth daily.  90 tablet  3  . Multiple Vitamin (MULTIVITAMIN WITH MINERALS) TABS Take 1 tablet by mouth daily.      Marland Kitchen Phenylephrine-Chlorphen-DM 01-04-11.5 MG/5ML LIQD Take 5 mLs by mouth  once. 1 tsp q 4h prn cough and cold  120 mL  0  . pravastatin (PRAVACHOL) 40 MG tablet Take 1 tablet (40 mg total) by mouth daily.  90 tablet  3    ROS: Per HPI.  All other systems reviewed and are negative.   Physical Exam Filed Vitals:   03/10/12 1503  BP: 121/68  Pulse: 70  Temp: 98.7 F (37.1 C)    Physical Examination: General appearance - alert, well appearing, and in no distress Mental status - normal mood, behavior, speech, dress, motor activity, and thought processes Nose - mucosal erythema, clear rhinorrhea, sinuses normal and nontender and turbinate edema B/L Mouth - mucous membranes moist, pharynx normal without lesions Neck - supple, no significant adenopathy Chest - clear to auscultation, no wheezes, rales or rhonchi, symmetric air entry Heart - normal rate, regular rhythm, normal S1, S2, no murmurs, rubs, clicks or gallops Abdomen - soft, nontender, nondistended, no masses or organomegaly Musculoskeletal - no joint tenderness, deformity or swelling, no muscular tenderness noted    Chemistry      Component Value Date/Time   NA 137 11/13/2011 0820   K 3.4* 11/13/2011 0820   CL 101 11/13/2011 0820   CO2 24 11/13/2011 0820   BUN 13 11/13/2011 0820   CREATININE 0.97 11/13/2011 0820   CREATININE  1.01 04/11/2011 1044      Component Value Date/Time   CALCIUM 9.2 11/13/2011 0820   ALKPHOS 45 11/13/2011 0820   AST 19 11/13/2011 0820   ALT 17 11/13/2011 0820   BILITOT 0.1* 11/13/2011 0820      Lab Results  Component Value Date   CHOL 181 01/14/2012   HDL 36* 01/14/2012   LDLCALC 124* 01/14/2012   LDLDIRECT 200* 03/15/2010   TRIG 106 01/14/2012   CHOLHDL 5.0 01/14/2012

## 2012-03-10 NOTE — Assessment & Plan Note (Signed)
TC 181 and LDL 124 on last lipid profile in October.  Continue Pravachol 40 mg despite having occasional myalgias from medication.  Pt and I discussed benefits most likely outweigh the risks and she believes this as well and willing to stay on the medication.  Will check CMP today for LFT's and direct LDL.  Will see back in one month.

## 2012-03-12 ENCOUNTER — Encounter: Payer: Self-pay | Admitting: Family Medicine

## 2012-03-25 ENCOUNTER — Emergency Department (HOSPITAL_COMMUNITY)
Admission: EM | Admit: 2012-03-25 | Discharge: 2012-03-26 | Disposition: A | Discharge status: Home / Self Care | Payer: No Typology Code available for payment source | Attending: Emergency Medicine | Admitting: Emergency Medicine

## 2012-03-25 ENCOUNTER — Encounter (HOSPITAL_COMMUNITY): Payer: Self-pay | Admitting: *Deleted

## 2012-03-25 DIAGNOSIS — R55 Syncope and collapse: Secondary | ICD-10-CM | POA: Insufficient documentation

## 2012-03-25 DIAGNOSIS — R42 Dizziness and giddiness: Secondary | ICD-10-CM | POA: Insufficient documentation

## 2012-03-25 DIAGNOSIS — Z79899 Other long term (current) drug therapy: Secondary | ICD-10-CM | POA: Insufficient documentation

## 2012-03-25 DIAGNOSIS — I10 Essential (primary) hypertension: Secondary | ICD-10-CM | POA: Insufficient documentation

## 2012-03-25 DIAGNOSIS — K219 Gastro-esophageal reflux disease without esophagitis: Secondary | ICD-10-CM | POA: Insufficient documentation

## 2012-03-25 DIAGNOSIS — E7800 Pure hypercholesterolemia, unspecified: Secondary | ICD-10-CM | POA: Insufficient documentation

## 2012-03-25 DIAGNOSIS — R51 Headache: Secondary | ICD-10-CM

## 2012-03-25 DIAGNOSIS — J019 Acute sinusitis, unspecified: Secondary | ICD-10-CM | POA: Insufficient documentation

## 2012-03-25 HISTORY — DX: Gastro-esophageal reflux disease without esophagitis: K21.9

## 2012-03-25 LAB — CBC
HCT: 38.3 % (ref 36.0–46.0)
Hemoglobin: 13.1 g/dL (ref 12.0–15.0)
MCH: 29.1 pg (ref 26.0–34.0)
MCHC: 34.2 g/dL (ref 30.0–36.0)
MCV: 85.1 fL (ref 78.0–100.0)
Platelets: 263 10*3/uL (ref 150–400)
RBC: 4.5 MIL/uL (ref 3.87–5.11)
RDW: 13.5 % (ref 11.5–15.5)
WBC: 7.5 10*3/uL (ref 4.0–10.5)

## 2012-03-25 LAB — BASIC METABOLIC PANEL
BUN: 10 mg/dL (ref 6–23)
CO2: 27 mEq/L (ref 19–32)
Calcium: 10.6 mg/dL — ABNORMAL HIGH (ref 8.4–10.5)
Chloride: 100 mEq/L (ref 96–112)
Creatinine, Ser: 0.96 mg/dL (ref 0.50–1.10)
GFR calc Af Amer: 76 mL/min — ABNORMAL LOW (ref >90)
GFR calc non Af Amer: 66 mL/min — ABNORMAL LOW (ref >90)
Glucose, Bld: 136 mg/dL — ABNORMAL HIGH (ref 70–99)
Potassium: 3.4 mEq/L — ABNORMAL LOW (ref 3.5–5.1)
Sodium: 139 mEq/L (ref 135–145)

## 2012-03-25 MED ORDER — GUAIFENESIN ER 600 MG PO TB12: 1200.0000 mg | ORAL_TABLET | Freq: Two times a day (BID) | ORAL | Status: DC

## 2012-03-25 MED ORDER — AMOXICILLIN-POT CLAVULANATE 875-125 MG PO TABS: 1.0000 | ORAL_TABLET | Freq: Two times a day (BID) | ORAL | Status: DC

## 2012-03-25 NOTE — ED Notes (Signed)
Pt states HA since earlier this evening. Pt states that she usually takes nasal spray and it helps with her HA pt also taking OTC sinus medications. Pt saw PCP and was told she had congestion in her sinuses. Pt states she feels stuffy. Pt states that she got lightheaded when she stood earlier. Pt ambulatory and states that her headache feels pressure.

## 2012-03-25 NOTE — ED Provider Notes (Signed)
History     CSN: [id number]  Arrival date & time 03/25/12  2043   First MD Initiated Contact with Patient 03/25/12 2201      Chief Complaint  Patient presents with  . Headache    (Consider location/radiation/quality/duration/timing/severity/associated sxs/prior treatment) Patient is a 56 y.o. female presenting with general illness. The history is provided by the patient. No language interpreter was used.  Illness  The current episode started 5 to 7 days ago. The problem occurs continuously. The problem has been gradually worsening. The problem is moderate. Nothing relieves the symptoms. Associated symptoms include headaches. Pertinent negatives include no fever, no abdominal pain, no constipation, no diarrhea, no nausea, no vomiting, no congestion, no sore throat, no cough and no rash.    Past Medical History  Diagnosis Date  . Hypertension   . Hypercholesteremia     taken off chol meds Dec 2012  . Acid reflux     Past Surgical History  Procedure Date  . Cesarean section 25 years ago    Family History  Problem Relation Age of Onset  . Hypertension Mother   . Diabetes Daughter     History  Substance Use Topics  . Smoking status: Never Smoker   . Smokeless tobacco: Former Neurosurgeon    Types: Chew    Quit date: 11/18/1996  . Alcohol Use: No    OB History    Grav Para Term Preterm Abortions TAB SAB Ect Mult Living                  Review of Systems  Constitutional: Negative for fever and chills.  HENT: Negative for congestion and sore throat.   Respiratory: Negative for cough and shortness of breath.   Cardiovascular: Negative for chest pain and leg swelling.  Gastrointestinal: Negative for nausea, vomiting, abdominal pain, diarrhea and constipation.  Genitourinary: Negative for dysuria and frequency.  Skin: Negative for color change and rash.  Neurological: Positive for light-headedness and headaches. Negative for dizziness.  Psychiatric/Behavioral: Negative  for confusion and agitation.  All other systems reviewed and are negative.    Allergies  Review of patient's allergies indicates no known allergies.  Home Medications   Current Outpatient Rx  Name  Route  Sig  Dispense  Refill  . LISINOPRIL-HYDROCHLOROTHIAZIDE 20-25 MG PO TABS   Oral   Take 1 tablet by mouth daily.   90 tablet   3   . PRAVASTATIN SODIUM 40 MG PO TABS   Oral   Take 1 tablet (40 mg total) by mouth daily.   90 tablet   3   . SALINE NASAL SPRAY 0.65 % NA SOLN   Nasal   Place 4 sprays into the nose as needed. For congestion.         Marland Kitchen FAMOTIDINE 20 MG PO TABS   Oral   Take 1 tablet (20 mg total) by mouth 2 (two) times daily.   60 tablet   2   . ADULT MULTIVITAMIN W/MINERALS CH   Oral   Take 1 tablet by mouth daily.           BP 118/72  Pulse 68  Temp 98.8 F (37.1 C) (Oral)  Resp 16  SpO2 99%  LMP 03/03/2012  Physical Exam  Vitals reviewed. Constitutional: She is oriented to person, place, and time. She appears well-developed and well-nourished. No distress.  HENT:  Head: Normocephalic and atraumatic.    Right Ear: Tympanic membrane normal.  Left Ear: Tympanic membrane normal.  Mouth/Throat: Uvula is midline, oropharynx is clear and moist and mucous membranes are normal.  Eyes: EOM are normal. Pupils are equal, round, and reactive to light.  Neck: Normal range of motion. Neck supple. No Brudzinski's sign noted.  Cardiovascular: Normal rate and regular rhythm.   Pulmonary/Chest: Effort normal. No respiratory distress.  Abdominal: Soft. She exhibits no distension.  Musculoskeletal: Normal range of motion. She exhibits no edema.  Neurological: She is alert and oriented to person, place, and time. She has normal strength. No cranial nerve deficit or sensory deficit. Coordination normal. GCS eye subscore is 4. GCS verbal subscore is 5. GCS motor subscore is 6.  Skin: Skin is warm and dry.  Psychiatric: She has a normal mood and affect. Her  behavior is normal.    ED Course  Procedures (including critical care time)  Labs Reviewed - No data to display No results found. Results for orders placed during the hospital encounter of 03/25/12  BASIC METABOLIC PANEL      Component Value Range   Sodium 139  135 - 145 mEq/L   Potassium 3.4 (*) 3.5 - 5.1 mEq/L   Chloride 100  96 - 112 mEq/L   CO2 27  19 - 32 mEq/L   Glucose, Bld 136 (*) 70 - 99 mg/dL   BUN 10  6 - 23 mg/dL   Creatinine, Ser 1.61  0.50 - 1.10 mg/dL   Calcium 09.6 (*) 8.4 - 10.5 mg/dL   GFR calc non Af Amer 66 (*) >90 mL/min   GFR calc Af Amer 76 (*) >90 mL/min  CBC      Component Value Range   WBC 7.5  4.0 - 10.5 K/uL   RBC 4.50  3.87 - 5.11 MIL/uL   Hemoglobin 13.1  12.0 - 15.0 g/dL   HCT 04.5  40.9 - 81.1 %   MCV 85.1  78.0 - 100.0 fL   MCH 29.1  26.0 - 34.0 pg   MCHC 34.2  30.0 - 36.0 g/dL   RDW 91.4  78.2 - 95.6 %   Platelets 263  150 - 400 K/uL     No diagnosis found.    MDM  Sinus HA x 7 days, frontal aching/pressure, relieved w/ saline rinses. Persistent, increasing in severity. Today near syncope, didn't eat all day. No prodrome, no true LOC, no vertigo/chest pain/palpitation/dyspnea or severe sudden onset, thunderclap HA. States sx have resolved. HA did not acutely worsen at onset of lightheadedness   Exam: afebrile, vitals unremarkable, ttp maxillary and frontal sinus. No meningismus, no focal neuro deficits, normal cerebellar function.   Plan: near syncope likely 2/2 hypoglycemia or dehydration. Doubt arrhythmia, CVA/TIA. Headache likely 2/2 sinus congestion - doubt SAH, meningitis/encephalitis. Will eval near syncope w/ ECG, cbc, bmp, orthostatics.   Course: reassessed, vitals stable, NAD, given motrin for HA, Hgb 13, glucose 136. Not orthostatic. At this time i feel that near syncope was likely 2/2 hypoglycemia. I feel that HA is sinus related and low risk for CVA/SAH. Stable for d/c home. Given rx for augmentin and mucinex. Continue saline  rinse. Follow up w/ pcp as needed. Given return precautions.   1. Acute sinus infection   2. Headache   3. Near syncope    New Prescriptions   AMOXICILLIN-CLAVULANATE (AUGMENTIN) 875-125 MG PER TABLET    Take 1 tablet by mouth every 12 (twelve) hours.   GUAIFENESIN (MUCINEX) 600 MG 12 HR TABLET    Take 2 tablets (1,200 mg total) by mouth 2 (two)  times daily.   Briscoe Deutscher, DO [address] [place] [address] [phone]    [place], [place] [address] [place] [address] [phone] Schedule an appointment as soon as possible for a visit          Audelia Hives, MD 03/26/12 [phone]

## 2012-03-26 MED ORDER — IBUPROFEN 800 MG PO TABS
800.0000 mg | ORAL_TABLET | Freq: Once | ORAL | Status: AC
  Administered 2012-03-26: 800 mg via ORAL
  Filled 2012-03-26: qty 1

## 2012-03-26 NOTE — ED Provider Notes (Signed)
I saw and evaluated the patient, reviewed the resident's note and I agree with the findings and plan. The patient presents here with complaints of facial pain/headache for the past week.  She denies fevers but feels chilled.  There are no visual complaints or dizziness.  On exam, the vitals are stable and the patient is afebrile.  The heart and lung exam is unremarkable and the abd is benign.  The neuro exam is non-focal and she is ttp over the maxillary sinuses.    The labs are unremarkable and will treat as sinusitis.  Return prn.  Geoffery Lyons, MD 03/26/12 [phone]

## 2012-03-28 ENCOUNTER — Ambulatory Visit (INDEPENDENT_AMBULATORY_CARE_PROVIDER_SITE_OTHER): Payer: No Typology Code available for payment source | Admitting: Family Medicine

## 2012-03-28 VITALS — BP 137/66 | HR 60 | Temp 98.4°F | Ht 63.0 in | Wt 179.0 lb

## 2012-03-28 DIAGNOSIS — N6310 Unspecified lump in the right breast, unspecified quadrant: Secondary | ICD-10-CM | POA: Insufficient documentation

## 2012-03-28 DIAGNOSIS — N63 Unspecified lump in unspecified breast: Secondary | ICD-10-CM

## 2012-03-28 NOTE — Progress Notes (Signed)
  Subjective:    Patient ID: Tasha Ortiz, female    DOB: 1956/03/20, 56 y.o.   MRN: [id number]  HPI  Patient presents to same day clinic for RT breast lump.  Patient says she noticed it about 2-3 months ago.  Described as "round, bumpy, itchy and not painful."  Located above RT areola.  Denies any erythema around nipple or nipple discharge or bleeding.    She is due for a screening mammogram but has had to wait for orange card for mammogram referral.  Last mammogram was in 2011, and patient reports it was normal.  Patient denies any family history (1st degree) of breast cancer.    Denies any fever, chills, night sweats, change in appetite, or nausea/vomiting.  Review of Systems  Per HPI    Objective:   Physical Exam  Constitutional: She appears well-nourished. No distress.  Neck: Normal range of motion. Neck supple.  Pulmonary/Chest: Right breast exhibits mass. Right breast exhibits no nipple discharge, no skin change and no tenderness. Left breast exhibits no mass, no nipple discharge, no skin change and no tenderness. Breasts are symmetrical.       RT breast: firm, round, immobile, non-tender mass located 12 o'clock position superior to areola with surrounding ropy tissue texture changes LT breast exam: normal  Lymphadenopathy:    She has no cervical adenopathy.    She has no axillary adenopathy.      Assessment & Plan:

## 2012-03-28 NOTE — Patient Instructions (Addendum)
Please go to the [facility] for your diagnostic mammogram as scheduled. Hope you have a happy new year and please call us if you have any questions or concerns.

## 2012-03-28 NOTE — Assessment & Plan Note (Addendum)
Located 12 o'clock position directly above areola.  Immobile, firm, round with surrounding fibrocystic changes. Will order diagnostic mammogram bilateral and RT breast US - may be benign cyst or fibrocystic adenoma, but must R/O malignancy. Patient to follow up with PCP as needed.

## 2012-04-06 ENCOUNTER — Emergency Department (HOSPITAL_COMMUNITY)
Admission: EM | Admit: 2012-04-06 | Discharge: 2012-04-07 | Disposition: A | Discharge status: Home / Self Care | Payer: Medicaid Other | Attending: Emergency Medicine | Admitting: Emergency Medicine

## 2012-04-06 ENCOUNTER — Emergency Department (HOSPITAL_COMMUNITY): Payer: Medicaid Other

## 2012-04-06 DIAGNOSIS — K2970 Gastritis, unspecified, without bleeding: Secondary | ICD-10-CM | POA: Insufficient documentation

## 2012-04-06 DIAGNOSIS — R05 Cough: Secondary | ICD-10-CM | POA: Insufficient documentation

## 2012-04-06 DIAGNOSIS — K2990 Gastroduodenitis, unspecified, without bleeding: Secondary | ICD-10-CM | POA: Insufficient documentation

## 2012-04-06 DIAGNOSIS — R059 Cough, unspecified: Secondary | ICD-10-CM | POA: Insufficient documentation

## 2012-04-06 DIAGNOSIS — I10 Essential (primary) hypertension: Secondary | ICD-10-CM | POA: Insufficient documentation

## 2012-04-06 DIAGNOSIS — J3489 Other specified disorders of nose and nasal sinuses: Secondary | ICD-10-CM

## 2012-04-06 DIAGNOSIS — E7800 Pure hypercholesterolemia, unspecified: Secondary | ICD-10-CM | POA: Insufficient documentation

## 2012-04-06 DIAGNOSIS — Z79899 Other long term (current) drug therapy: Secondary | ICD-10-CM | POA: Insufficient documentation

## 2012-04-06 DIAGNOSIS — Z8719 Personal history of other diseases of the digestive system: Secondary | ICD-10-CM | POA: Insufficient documentation

## 2012-04-06 DIAGNOSIS — R0982 Postnasal drip: Secondary | ICD-10-CM | POA: Insufficient documentation

## 2012-04-06 LAB — HEPATIC FUNCTION PANEL
ALT: 16 U/L (ref 0–35)
AST: 21 U/L (ref 0–37)
Albumin: 4.1 g/dL (ref 3.5–5.2)
Alkaline Phosphatase: 47 U/L (ref 39–117)
Bilirubin, Direct: <0.1 mg/dL (ref 0.0–0.3)
Total Bilirubin: 0.2 mg/dL — ABNORMAL LOW (ref 0.3–1.2)
Total Protein: 7.8 g/dL (ref 6.0–8.3)

## 2012-04-06 LAB — CBC WITH DIFFERENTIAL/PLATELET
Basophils Absolute: 0 10*3/uL (ref 0.0–0.1)
Basophils Relative: 0 % (ref 0–1)
Eosinophils Absolute: 0.1 10*3/uL (ref 0.0–0.7)
Eosinophils Relative: 2 % (ref 0–5)
HCT: 37.6 % (ref 36.0–46.0)
Hemoglobin: 12.8 g/dL (ref 12.0–15.0)
Lymphocytes Relative: 43 % (ref 12–46)
Lymphs Abs: 3.1 10*3/uL (ref 0.7–4.0)
MCH: 29 pg (ref 26.0–34.0)
MCHC: 34 g/dL (ref 30.0–36.0)
MCV: 85.3 fL (ref 78.0–100.0)
Monocytes Absolute: 0.6 10*3/uL (ref 0.1–1.0)
Monocytes Relative: 8 % (ref 3–12)
Neutro Abs: 3.4 10*3/uL (ref 1.7–7.7)
Neutrophils Relative %: 47 % (ref 43–77)
Platelets: 254 10*3/uL (ref 150–400)
RBC: 4.41 MIL/uL (ref 3.87–5.11)
RDW: 13.3 % (ref 11.5–15.5)
WBC: 7.2 10*3/uL (ref 4.0–10.5)

## 2012-04-06 LAB — POCT I-STAT TROPONIN I: Troponin i, poc: 0 ng/mL (ref 0.00–0.08)

## 2012-04-06 LAB — BASIC METABOLIC PANEL
BUN: 11 mg/dL (ref 6–23)
CO2: 26 mEq/L (ref 19–32)
Calcium: 10 mg/dL (ref 8.4–10.5)
Chloride: 101 mEq/L (ref 96–112)
Creatinine, Ser: 0.9 mg/dL (ref 0.50–1.10)
GFR calc Af Amer: 83 mL/min — ABNORMAL LOW (ref >90)
GFR calc non Af Amer: 71 mL/min — ABNORMAL LOW (ref >90)
Glucose, Bld: 102 mg/dL — ABNORMAL HIGH (ref 70–99)
Potassium: 3.3 mEq/L — ABNORMAL LOW (ref 3.5–5.1)
Sodium: 139 mEq/L (ref 135–145)

## 2012-04-06 LAB — TROPONIN I: Troponin I: <0.3 ng/mL (ref <0.30)

## 2012-04-06 LAB — LIPASE, BLOOD: Lipase: 44 U/L (ref 11–59)

## 2012-04-06 MED ORDER — SODIUM CHLORIDE 0.9 % IV BOLUS (SEPSIS)
1000.0000 mL | Freq: Once | INTRAVENOUS | Status: AC
  Administered 2012-04-06: 1000 mL via INTRAVENOUS

## 2012-04-06 MED ORDER — DIPHENHYDRAMINE HCL 50 MG/ML IJ SOLN
25.0000 mg | Freq: Once | INTRAMUSCULAR | Status: AC
  Administered 2012-04-06: 25 mg via INTRAVENOUS
  Filled 2012-04-06: qty 1

## 2012-04-06 MED ORDER — METOCLOPRAMIDE HCL 5 MG/ML IJ SOLN
10.0000 mg | Freq: Once | INTRAMUSCULAR | Status: AC
  Administered 2012-04-06: 10 mg via INTRAVENOUS
  Filled 2012-04-06: qty 2

## 2012-04-06 MED ORDER — GI COCKTAIL ~~LOC~~
30.0000 mL | Freq: Once | ORAL | Status: AC
  Administered 2012-04-06: 30 mL via ORAL
  Filled 2012-04-06 (×2): qty 30

## 2012-04-06 MED ORDER — PANTOPRAZOLE SODIUM 40 MG IV SOLR
40.0000 mg | Freq: Once | INTRAVENOUS | Status: AC
  Administered 2012-04-06: 40 mg via INTRAVENOUS
  Filled 2012-04-06: qty 40

## 2012-04-06 MED ORDER — KETOROLAC TROMETHAMINE 30 MG/ML IJ SOLN
30.0000 mg | Freq: Once | INTRAMUSCULAR | Status: AC
  Administered 2012-04-06: 30 mg via INTRAVENOUS
  Filled 2012-04-06: qty 1

## 2012-04-06 NOTE — ED Provider Notes (Signed)
History  This chart was scribed for Tasha Octave, MD by Bennett Scrape, ED Scribe. This patient was seen in room A06C/A06C and the patient's care was started at 11:07 PM.  CSN: [id number]  Arrival date & time 04/06/12  2144   First MD Initiated Contact with Patient 04/06/12 2307      Chief Complaint  Patient presents with  . Abdominal Pain     The history is provided by the patient. No language interpreter was used.    Tasha Ortiz is a 57 y.o. female who presents to the Emergency Department complaining of approximately 2.5 weeks of gradual onset, gradually worsening, intermittent HAs described as pressure and located in the occipital region with associated 2 to 3 months of sinus pressure, mild cough and post-nasal drip. She was seen in the ED for same 1.5 weeks ago (Dec. 24th, 2013) and given Augmentin and told to take Mucinex as well. She reports that taken together, the symptoms have transient improvement but since she finished her antibiotic prescription, the Mucinex has not been improving the symptoms on its own. She denies following up with an ENT for the symptoms. She also c/o 6 months of intermittent epigastric abdominal pain described as a burning sensation  that lasts all day with associated 2 days of a sour taste in her mouth that she attributes to GERD. She states that she was diagnosed by her PCP but has not be diagnosed through endoscopy or evaluated by a GI doctor. She reports taking Tums and Pepcid with improvement. She denies having a h/o ulcers, chronic cardiac or pulmonary medical problems. She denies taking ibuprofen or ASA on a daily basis. She denies worsening of symptoms, fevers, constipation, diarrhea, nausea, emesis and CP as associated symptoms. She has a h/o HTN and HLD (currently un medicated as of December 2012).  PCP is Dr. Warner Mccreedy  Past Medical History  Diagnosis Date  . Hypertension   . Hypercholesteremia     taken off chol meds Dec 2012  . Acid  reflux     Past Surgical History  Procedure Date  . Cesarean section 25 years ago    Family History  Problem Relation Age of Onset  . Hypertension Mother   . Diabetes Daughter     History  Substance Use Topics  . Smoking status: Never Smoker   . Smokeless tobacco: Former Neurosurgeon    Types: Chew    Quit date: 11/18/1996  . Alcohol Use: No    No OB history provided.  Review of Systems  A complete 10 system review of systems was obtained and all systems are negative except as noted in the HPI and PMH.   Allergies  Review of patient's allergies indicates no known allergies.  Home Medications   Current Outpatient Rx  Name  Route  Sig  Dispense  Refill  . AMOXICILLIN-POT CLAVULANATE 875-125 MG PO TABS   Oral   Take 1 tablet by mouth every 12 (twelve) hours.   14 tablet   0   . FAMOTIDINE 20 MG PO TABS   Oral   Take 1 tablet (20 mg total) by mouth 2 (two) times daily.   60 tablet   2   . GUAIFENESIN ER 600 MG PO TB12   Oral   Take 2 tablets (1,200 mg total) by mouth 2 (two) times daily.   30 tablet   0   . LISINOPRIL-HYDROCHLOROTHIAZIDE 20-25 MG PO TABS   Oral   Take 1 tablet by mouth daily.  90 tablet   3   . ADULT MULTIVITAMIN W/MINERALS CH   Oral   Take 1 tablet by mouth daily.         Marland Kitchen PRAVASTATIN SODIUM 40 MG PO TABS   Oral   Take 1 tablet (40 mg total) by mouth daily.   90 tablet   3   . SALINE NASAL SPRAY 0.65 % NA SOLN   Nasal   Place 4 sprays into the nose as needed. For congestion.           Triage Vitals: BP 131/67  Pulse 73  Temp 97.9 F (36.6 C) (Oral)  Resp 18  SpO2 98%  LMP 03/03/2012  Physical Exam  Nursing note and vitals reviewed. Constitutional: She is oriented to person, place, and time. She appears well-developed and well-nourished. No distress.  HENT:  Head: Normocephalic and atraumatic.  Mouth/Throat: Oropharynx is clear and moist.       TMs are normal bilaterally, mild frontal and maxillary sinus tenderness    Eyes: Conjunctivae normal and EOM are normal. Pupils are equal, round, and reactive to light.  Neck: Normal range of motion. Neck supple. No tracheal deviation present.       No meningismus   Cardiovascular: Normal rate and regular rhythm.   Pulmonary/Chest: Effort normal and breath sounds normal. No respiratory distress.  Abdominal: Soft. Bowel sounds are normal. There is tenderness (mild epigastric tenderness). There is no rebound and no guarding.  Musculoskeletal: Normal range of motion. She exhibits no edema.  Neurological: She is alert and oriented to person, place, and time. No cranial nerve deficit (cranial nerves 2 through 12 intact).       5/5 strength throughout   Skin: Skin is warm and dry.  Psychiatric: She has a normal mood and affect. Her behavior is normal.    ED Course  Procedures (including critical care time)  DIAGNOSTIC STUDIES: Oxygen Saturation is 98% on room air, normal by my interpretation.    COORDINATION OF CARE: 11:15 PM- Discussed treatment plan which includes CT of head, CXR, CBC, GI cocktail and migraine cocktail with pt at bedside and pt agreed to plan.   11:15 PM- Ordered GI cocktail  11:30 PM- Ordered 1,000 mL of Bolus, 30 mg Toradol injection, 10 mg Reglan injection, 25 mg Benadryl injection and 40 mg Protonix injection  12:48 AM-Pt rechecked and feels improved with medications listed above. Informed pt of radiology and lab results. Discussed discharge plan which includes antibiotic and referral to ENT and GI with pt and pt agreed to.   Labs Reviewed  BASIC METABOLIC PANEL - Abnormal; Notable for the following:    Potassium 3.3 (*)     Glucose, Bld 102 (*)     GFR calc non Af Amer 71 (*)     GFR calc Af Amer 83 (*)     All other components within normal limits  HEPATIC FUNCTION PANEL - Abnormal; Notable for the following:    Total Bilirubin 0.2 (*)     All other components within normal limits  CBC WITH DIFFERENTIAL  POCT I-STAT TROPONIN I   LIPASE, BLOOD  TROPONIN I   Ct Head Wo Contrast  04/07/2012  *RADIOLOGY REPORT*  Clinical Data: Intermittent headaches for 2:01 half weeks.  CT HEAD WITHOUT CONTRAST  Technique:  Contiguous axial images were obtained from the base of the skull through the vertex without contrast.  Comparison: None.  Findings: No evidence of acute intracranial abnormality including infarct, hemorrhage, mass lesion, mass  effect, midline shift or abnormal extra-axial fluid collection.  No hydrocephalus or pneumocephalus.  Calvarium intact.  IMPRESSION: Negative exam.   Original Report Authenticated By: Holley Dexter, M.D.    Dg Abd Acute W/chest  04/07/2012  *RADIOLOGY REPORT*  Clinical Data: Left lower quadrant abdominal pain for 2-3 months.  ACUTE ABDOMEN SERIES (ABDOMEN 2 VIEW & CHEST 1 VIEW)  Comparison: None.  Findings: Normal heart size with clear lung fields.  No bony abnormality.  No effusion pneumothorax.  Nonobstructive gas pattern.  Mild stool burden.  No abnormal calcifications or osseous abnormalities.  IMPRESSION: Unremarkable acute abdomen series.   Original Report Authenticated By: Davonna Belling, M.D.      No diagnosis found.    MDM  Several months of "sinus pressure" recently treated in December 24 with Augmentin with transient improvement. Complains of daily headaches with sinus pressure. No fevers or vomiting. Also complains of epigastric pain has been constant for the past 2 days of nausea. History of GERD and allergic rhinitis. Has not seen specialists.  Labs unremarkable.  AAS negative. Symptoms improved with GI cocktail and PPI.  Suspect the patient's abdominal discomfort related to GERD and reflux. We'll treat with PPI and antihistamines. Abdomen is soft and nontender on reassessment  Will refer to ENT for continued sinus problems.     Date: 04/06/2012  Rate: 66  Rhythm: normal sinus rhythm  QRS Axis: normal  Intervals: normal  ST/T Wave abnormalities: normal  Conduction  Disutrbances:none  Narrative Interpretation:   Old EKG Reviewed: none available  I personally performed the services described in this documentation, which was scribed in my presence. The recorded information has been reviewed and is accurate.     Tasha Octave, MD 04/07/12 [phone]

## 2012-04-06 NOTE — ED Notes (Signed)
Reports upper abdominal pain described as aching that began today, she states usually it is bloated and she has a lot of gas, but today it is achy.  Denies nausea, denies SOB. C/o sinus pressure and headache. Seen on the 24th of December, was given antibiotics and mucinex but it did not help.  Reports sour taste in mouth.

## 2012-04-07 ENCOUNTER — Encounter (HOSPITAL_COMMUNITY): Payer: Self-pay | Admitting: Radiology

## 2012-04-07 ENCOUNTER — Other Ambulatory Visit: Payer: Self-pay | Admitting: Obstetrics and Gynecology

## 2012-04-07 ENCOUNTER — Emergency Department (HOSPITAL_COMMUNITY): Payer: Medicaid Other

## 2012-04-07 ENCOUNTER — Ambulatory Visit
Admission: RE | Admit: 2012-04-07 | Discharge: 2012-04-07 | Disposition: A | Discharge status: Home / Self Care | Payer: No Typology Code available for payment source | Source: Ambulatory Visit | Attending: Family Medicine | Admitting: Family Medicine

## 2012-04-07 DIAGNOSIS — N6310 Unspecified lump in the right breast, unspecified quadrant: Secondary | ICD-10-CM

## 2012-04-07 DIAGNOSIS — N63 Unspecified lump in unspecified breast: Secondary | ICD-10-CM

## 2012-04-07 MED ORDER — MOMETASONE FUROATE 50 MCG/ACT NA SUSP: 2.0000 | Freq: Every day | NASAL | Status: DC

## 2012-04-07 MED ORDER — OMEPRAZOLE 20 MG PO CPDR: 20.0000 mg | DELAYED_RELEASE_CAPSULE | Freq: Every day | ORAL | Status: DC

## 2012-04-07 MED ORDER — SUCRALFATE 1 G PO TABS: 1.0000 g | ORAL_TABLET | Freq: Four times a day (QID) | ORAL | Status: DC

## 2012-04-07 MED ORDER — AMOXICILLIN-POT CLAVULANATE 875-125 MG PO TABS: 1.0000 | ORAL_TABLET | Freq: Two times a day (BID) | ORAL | Status: DC

## 2012-04-11 ENCOUNTER — Ambulatory Visit (HOSPITAL_COMMUNITY): Payer: No Typology Code available for payment source

## 2012-04-11 ENCOUNTER — Ambulatory Visit (HOSPITAL_COMMUNITY)
Admission: RE | Admit: 2012-04-11 | Discharge: 2012-04-11 | Disposition: A | Discharge status: Home / Self Care | Payer: No Typology Code available for payment source | Source: Ambulatory Visit | Attending: Obstetrics and Gynecology | Admitting: Obstetrics and Gynecology

## 2012-04-11 ENCOUNTER — Encounter (HOSPITAL_COMMUNITY): Payer: Self-pay

## 2012-04-11 VITALS — BP 112/72 | Temp 98.7°F | Ht 63.0 in | Wt 176.2 lb

## 2012-04-11 DIAGNOSIS — Z01419 Encounter for gynecological examination (general) (routine) without abnormal findings: Secondary | ICD-10-CM

## 2012-04-11 NOTE — Progress Notes (Addendum)
Patient referred to [facility] by the [facility] due to recommending a right breast biopsy. Diagnostic mammogram completed at the [facility] 04/06/2012.  Pap Smear:  Completed Pap smear today. Last Pap smear was 04/05/2009 and normal. Per patient no history of abnormal Pap smears. Pap smear result above in EPIC.  Physical exam: Breasts Breasts symmetrical. No skin abnormalities bilateral breasts. No nipple retraction bilateral breasts. No nipple discharge bilateral breasts. No lymphadenopathy. No lumps palpated left breast. Palpated 2 lumps in the right breast at 11 o'clock 3 cm from the nipple and 11 o'clock in the subareolar region. Palpated lump in the right axilla. No complaints of pain or tenderness when palpated lumps on exam. Referred patient to the [facility] for Right breast and right axilla biopsies per recommendation. Appointment scheduled for Wednesday, April 16, 2012 at 0800.         Pelvic/Bimanual   Ext Genitalia No lesions, no swelling and no discharge observed on external genitalia.         Vagina Vagina pink and normal texture. No lesions and mucous discharge observed in vagina.          Cervix Cervix is present. Cervix pink and of normal texture. Large amount of mucous discharge observed on cervix.         Uterus Uterus is present and palpable. Uterus in normal position and normal size.       Adnexae Bilateral ovaries present and palpable. No tenderness on palpation.        Rectovaginal No rectal exam completed today since patient had no rectal complaints. No skin abnormalities observed on exam.

## 2012-04-11 NOTE — Addendum Note (Signed)
Encounter addended by: Saintclair Halsted, RN on: 04/11/2012  4:19 PM<BR>     Documentation filed: Patient Instructions Section, Notes Section

## 2012-04-11 NOTE — Patient Instructions (Signed)
Taught patient how to perform BSE and gave educational materials to take home. Let her know [facility] will cover Pap smears every 3 years unless has a history of abnormal Pap smears. Referred patient to the [facility] for Right breast and right axilla biopsies per recommendation. Appointment scheduled for Wednesday, April 16, 2012 at 0800. Patient aware of appointment and will be there. Let patient know will follow up with her within the next couple weeks with results by letter or phone. Patient verbalized understanding.

## 2012-04-16 ENCOUNTER — Other Ambulatory Visit: Payer: No Typology Code available for payment source

## 2012-04-16 ENCOUNTER — Ambulatory Visit
Admission: RE | Admit: 2012-04-16 | Discharge: 2012-04-16 | Disposition: A | Discharge status: Home / Self Care | Payer: No Typology Code available for payment source | Source: Ambulatory Visit | Attending: Obstetrics and Gynecology | Admitting: Obstetrics and Gynecology

## 2012-04-16 ENCOUNTER — Inpatient Hospital Stay: Admission: RE | Admit: 2012-04-16 | Payer: No Typology Code available for payment source | Source: Ambulatory Visit

## 2012-04-16 DIAGNOSIS — N63 Unspecified lump in unspecified breast: Secondary | ICD-10-CM

## 2012-04-16 HISTORY — PX: BREAST BIOPSY: SHX20

## 2012-04-17 ENCOUNTER — Other Ambulatory Visit (INDEPENDENT_AMBULATORY_CARE_PROVIDER_SITE_OTHER): Payer: Self-pay | Admitting: General Surgery

## 2012-04-17 DIAGNOSIS — C50911 Malignant neoplasm of unspecified site of right female breast: Secondary | ICD-10-CM

## 2012-04-18 ENCOUNTER — Telehealth: Payer: Self-pay | Admitting: *Deleted

## 2012-04-18 DIAGNOSIS — C50411 Malignant neoplasm of upper-outer quadrant of right female breast: Secondary | ICD-10-CM | POA: Insufficient documentation

## 2012-04-18 DIAGNOSIS — C50419 Malignant neoplasm of upper-outer quadrant of unspecified female breast: Secondary | ICD-10-CM

## 2012-04-18 NOTE — Telephone Encounter (Signed)
Confirmed [facility] for 04/23/12 at 1200 .  Instructions and contact information given.

## 2012-04-21 ENCOUNTER — Ambulatory Visit (HOSPITAL_COMMUNITY)
Admission: RE | Admit: 2012-04-21 | Discharge: 2012-04-21 | Disposition: A | Discharge status: Home / Self Care | Payer: Medicaid Other | Source: Ambulatory Visit | Attending: General Surgery | Admitting: General Surgery

## 2012-04-21 ENCOUNTER — Encounter (INDEPENDENT_AMBULATORY_CARE_PROVIDER_SITE_OTHER): Payer: Self-pay

## 2012-04-21 DIAGNOSIS — C50919 Malignant neoplasm of unspecified site of unspecified female breast: Secondary | ICD-10-CM | POA: Insufficient documentation

## 2012-04-21 DIAGNOSIS — C773 Secondary and unspecified malignant neoplasm of axilla and upper limb lymph nodes: Secondary | ICD-10-CM | POA: Insufficient documentation

## 2012-04-21 DIAGNOSIS — C50911 Malignant neoplasm of unspecified site of right female breast: Secondary | ICD-10-CM

## 2012-04-21 MED ORDER — GADOBENATE DIMEGLUMINE 529 MG/ML IV SOLN
17.0000 mL | Freq: Once | INTRAVENOUS | Status: AC | PRN
  Administered 2012-04-21: 17 mL via INTRAVENOUS

## 2012-04-23 ENCOUNTER — Telehealth: Payer: Self-pay | Admitting: Oncology

## 2012-04-23 ENCOUNTER — Other Ambulatory Visit: Payer: No Typology Code available for payment source

## 2012-04-23 ENCOUNTER — Encounter: Payer: Self-pay | Admitting: *Deleted

## 2012-04-23 ENCOUNTER — Ambulatory Visit (HOSPITAL_BASED_OUTPATIENT_CLINIC_OR_DEPARTMENT_OTHER): Payer: Medicaid Other | Admitting: General Surgery

## 2012-04-23 ENCOUNTER — Encounter: Payer: Self-pay | Admitting: Oncology

## 2012-04-23 ENCOUNTER — Ambulatory Visit
Admission: RE | Admit: 2012-04-23 | Discharge: 2012-04-23 | Disposition: A | Discharge status: Home / Self Care | Payer: No Typology Code available for payment source | Source: Ambulatory Visit | Attending: Radiation Oncology | Admitting: Radiation Oncology

## 2012-04-23 ENCOUNTER — Ambulatory Visit: Payer: Medicaid Other | Attending: General Surgery | Admitting: Physical Therapy

## 2012-04-23 ENCOUNTER — Ambulatory Visit (HOSPITAL_BASED_OUTPATIENT_CLINIC_OR_DEPARTMENT_OTHER): Payer: No Typology Code available for payment source | Admitting: Oncology

## 2012-04-23 ENCOUNTER — Ambulatory Visit (HOSPITAL_BASED_OUTPATIENT_CLINIC_OR_DEPARTMENT_OTHER): Payer: No Typology Code available for payment source

## 2012-04-23 ENCOUNTER — Encounter: Payer: Self-pay | Admitting: Specialist

## 2012-04-23 VITALS — BP 113/83 | HR 76 | Temp 98.3°F | Resp 20 | Ht 63.0 in | Wt 173.6 lb

## 2012-04-23 DIAGNOSIS — C50419 Malignant neoplasm of upper-outer quadrant of unspecified female breast: Secondary | ICD-10-CM

## 2012-04-23 DIAGNOSIS — C50919 Malignant neoplasm of unspecified site of unspecified female breast: Secondary | ICD-10-CM | POA: Insufficient documentation

## 2012-04-23 DIAGNOSIS — R293 Abnormal posture: Secondary | ICD-10-CM | POA: Insufficient documentation

## 2012-04-23 DIAGNOSIS — M25619 Stiffness of unspecified shoulder, not elsewhere classified: Secondary | ICD-10-CM | POA: Insufficient documentation

## 2012-04-23 DIAGNOSIS — IMO0001 Reserved for inherently not codable concepts without codable children: Secondary | ICD-10-CM | POA: Insufficient documentation

## 2012-04-23 DIAGNOSIS — Z170 Estrogen receptor positive status [ER+]: Secondary | ICD-10-CM

## 2012-04-23 DIAGNOSIS — C50119 Malignant neoplasm of central portion of unspecified female breast: Secondary | ICD-10-CM

## 2012-04-23 LAB — COMPREHENSIVE METABOLIC PANEL (CC13)
ALT: 11 U/L (ref 0–55)
AST: 12 U/L (ref 5–34)
Albumin: 3.8 g/dL (ref 3.5–5.0)
Alkaline Phosphatase: 44 U/L (ref 40–150)
BUN: 11 mg/dL (ref 7.0–26.0)
CO2: 26 mEq/L (ref 22–29)
Calcium: 10.1 mg/dL (ref 8.4–10.4)
Chloride: 101 mEq/L (ref 98–107)
Creatinine: 1 mg/dL (ref 0.6–1.1)
Glucose: 144 mg/dl — ABNORMAL HIGH (ref 70–99)
Potassium: 3.5 mEq/L (ref 3.5–5.1)
Sodium: 137 mEq/L (ref 136–145)
Total Bilirubin: 0.32 mg/dL (ref 0.20–1.20)
Total Protein: 8.1 g/dL (ref 6.4–8.3)

## 2012-04-23 LAB — CBC WITH DIFFERENTIAL/PLATELET
BASO%: 0.4 % (ref 0.0–2.0)
Basophils Absolute: 0 10*3/uL (ref 0.0–0.1)
EOS%: 1.5 % (ref 0.0–7.0)
Eosinophils Absolute: 0.1 10*3/uL (ref 0.0–0.5)
HCT: 38.5 % (ref 34.8–46.6)
HGB: 13 g/dL (ref 11.6–15.9)
LYMPH%: 40.5 % (ref 14.0–49.7)
MCH: 28.8 pg (ref 25.1–34.0)
MCHC: 33.8 g/dL (ref 31.5–36.0)
MCV: 85.4 fL (ref 79.5–101.0)
MONO#: 0.3 10*3/uL (ref 0.1–0.9)
MONO%: 5.5 % (ref 0.0–14.0)
NEUT#: 2.8 10*3/uL (ref 1.5–6.5)
NEUT%: 52.1 % (ref 38.4–76.8)
Platelets: 228 10*3/uL (ref 145–400)
RBC: 4.51 10*6/uL (ref 3.70–5.45)
RDW: 13.4 % (ref 11.2–14.5)
WBC: 5.3 10*3/uL (ref 3.9–10.3)
lymph#: 2.1 10*3/uL (ref 0.9–3.3)
nRBC: 0 % (ref 0–0)

## 2012-04-23 LAB — CANCER ANTIGEN 27.29: CA 27.29: 16 U/mL (ref 0–39)

## 2012-04-23 NOTE — Progress Notes (Signed)
Subjective:  New  diagnosis cancer of the right breast  Patient ID: Tasha Ortiz, female   DOB: [birth year], 57 y.o.   MRN: [id number]  HPI Patient is a very pleasant 57 year old female referred by Dr. Micheline Maze at the breast center for a new diagnosis of right breast cancer. The patient states that she was initially able to feel a lump in her right breast approximately 3 months ago. She then presented to her primary M.D. And was referred to the breast center for imaging and diagnosis. I have personally reviewed all her imaging and pathology. Mammogram revealed an oval mass at the 11:00 position of the right breast corresponding to the palpable abnormality. In addition to further masses were seen at the 9:00 position of the right breast posteriorly and another subareolar mass at 11:00 as well as an enlarged axillary lymph node. Subsequent ultrasound-guided core biopsy was recommended and performed of all 3 masses in the breast and the axillary lymph node. These biopsies were all positive for invasive ductal carcinoma. 2 other breast masses showed grade 2 mucinous ER PR positive invasive ductal carcinoma with a low proliferation rate of 20%. One of the breast masses and the lymph node show a nonmucinous invasive ductal carcinoma also ER/PR positive and HER-2/neu negative with a higher proliferation rate of 70%. The patient is here today with her daughter to discuss treatment. She obviously continues to feel the mass but has had no other symptoms such as unusual pain or nipple discharge or bleeding or skin changes. She has no previous history of breast disease. She does not have any significant family history of breast cancer.  Past Medical History  Diagnosis Date  . Hypertension   . Hypercholesteremia     taken off chol meds Dec 2012  . Acid reflux   . Breast cancer   . Heartburn    Past Surgical History  Procedure Date  . Cesarean section 25 years ago   .cmed No Known Allergies History  Substance  Use Topics  . Smoking status: Never Smoker   . Smokeless tobacco: Former Neurosurgeon    Types: Chew    Quit date: 11/18/1996  . Alcohol Use: No   family history includes Cancer in her sister; Diabetes in her daughter; and Hypertension in her mother.   Review of Systems  Constitutional: Negative.   HENT: Positive for rhinorrhea and postnasal drip.   Eyes: Negative.   Respiratory: Negative.   Cardiovascular: Negative.   Gastrointestinal: Negative.   Genitourinary: Negative.   Musculoskeletal: Negative.   Neurological: Negative.        Objective:   Physical Exam General: Alert, well-developed African American female, in no distress Skin: Warm and dry without rash or infection. HEENT: No palpable masses or thyromegaly. Sclera nonicteric. Pupils equal round and reactive. Oropharynx clear. Lymph nodes: No cervical, supraclavicular, or inguinal nodes palpable. There is an approximately 3 cm freely movable mass or lymph node palpable in the right axilla. Breasts: There is a easily palpable superficial mass periareolar at the 11:00 position of the right breast. Probably somewhat less distinct palpable mass deep in the breast at the 11:00 position. No skin changes or nipple inversion. Lungs: Breath sounds clear and equal without increased work of breathing Cardiovascular: Regular rate and rhythm without murmur. No JVD or edema. Peripheral pulses intact. Abdomen: Nondistended. Soft and nontender. No masses palpable. No organomegaly. No palpable hernias. Extremities: No edema or joint swelling or deformity. No chronic venous stasis changes. Neurologic: Alert and fully oriented. Gait  normal.     Assessment:     New diagnosis of T2 N1 ER PR positive HER-2 negative multicentric invasive ductal carcinoma of the right breast. I discussed the nature of her diagnosis with the patient and her daughters. Due to multiple tumors within the breast I have recommended total mastectomy. She has documented  axillary disease and I would plan axillary dissection. The patient will require chemotherapy and I also recommended placement of a Port-A-Cath at the time of her mastectomy. We discussed the nature of the surgery including use of general anesthesia and risks of bleeding, infection, anesthetic complications, pneumothorax and catheter displacement thrombosis. We discussed expected recovery. She understands the need for total mastectomy versus breast conservation. We discussed that reconstruction would be an option for her but that we would recommend delayed reconstruction due to the likely need for postoperative radiation. All her questions were answered.    Plan:     Right modified mastectomy and placement of Port-A-Cath under general anesthesia with overnight hospitalization.

## 2012-04-23 NOTE — Patient Instructions (Addendum)
Proceed with surgery consisting of mastectomy  You will need chemotherapy and radiation therapy  I will see you back in 1 month

## 2012-04-23 NOTE — Progress Notes (Signed)
I met Tasha Ortiz and her two daughters today at breast clinic.  She rated her distress level as a "5".  She indicated she has transportation concerns, in that her daughter has a car that is not dependable.  I will ask one of the social workers to be in touch with her.  Tasha Ortiz has a very positive attitude, is open to support services, is studying Therapist, occupational at [employer].

## 2012-04-23 NOTE — Progress Notes (Signed)
Checked in new patient. Tasha Ortiz sent her an application for [employer] on Monday.

## 2012-04-23 NOTE — Telephone Encounter (Signed)
gv and pritned appt schedule for pt for Feb

## 2012-04-23 NOTE — Progress Notes (Signed)
Tasha Ortiz [id number] Jun 12, 1955 57 y.o. 04/23/2012 2:52 PM  CC  [name], DO [address] [place] [address] Dr. Dorothy Puffer Dr. Glenna Fellows  REASON FOR CONSULTATION:  57 year old female with new diagnosis of right breast cancer that is stage IIB ER positive PR positive HER-2/neu negative. Patient was seen in the Multidisciplinary Breast Clinic for discussion of her treatment options.  STAGE:   Cancer of upper-outer quadrant of female breast   Primary site: Breast (Right)   Staging method: AJCC 7th Edition   Clinical: Stage IIB (T2, N1, cM0)   Summary: Stage IIB (T2, N1, cM0)  REFERRING PHYSICIAN: Dr. Sharlet Salina Hoxworth  HISTORY OF PRESENT ILLNESS:  Tasha Ortiz is a 57 y.o. female.  Who is seen in the multidisciplinary breast clinic today for consultation. Patient noted a mass in the right breast about 4 months ago. She's had a mammogram every year on an annual basis. On her prior mammogram there were no suspicious findings. But because of the mass she presented for workup for the palpable mass. On mammogram there was noted to be an abnormality at the 11:00 position in the right breast corresponding to the palpable abnormality. There were also noted to be 2 additional suspicious masses within the right breast as well as abnormal right axillary lymph nodes. Patient went on to have an ultrasound-guided core needle biopsy of all 3 masses and the axillary lymph node. All 3 masses were positive for invasive ductal carcinoma. 2 primaries appear to be HER-2/neu negative ER positive PR +1 of the primaries demonstrated mucinous features with Ki-67 20%. The lymph node had a higher Ki-67 of 70%. Indicating there were 2 synchronous primaries. Patient underwent MRI of the breasts. Again 3 lesions were noted within the right breast. The larger mass was at the 10:00 position measuring 2.3 cm. An abnormal level I right axillary lymph node measures 3.0 cm. No abnormalities  in the contralateral breast. Patient's case was discussed at the multidisciplinary breast conference. And she is now seen in the multidisciplinary breast clinic for discussion of treatment options. Her radiology and pathology were reviewed carefully. She is without any complaints.   Past Medical History: Past Medical History  Diagnosis Date  . Hypertension   . Hypercholesteremia     taken off chol meds Dec 2012  . Acid reflux   . Breast cancer   . Heartburn     Past Surgical History: Past Surgical History  Procedure Date  . Cesarean section 25 years ago    Family History: Family History  Problem Relation Age of Onset  . Hypertension Mother   . Diabetes Daughter   . Cancer Sister     brain    Social History History  Substance Use Topics  . Smoking status: Never Smoker   . Smokeless tobacco: Former Neurosurgeon    Types: Chew    Quit date: 11/18/1996  . Alcohol Use: No    Allergies: No Known Allergies  Current Medications: Current Outpatient Prescriptions  Medication Sig Dispense Refill  . amoxicillin-clavulanate (AUGMENTIN) 875-125 MG per tablet Take 1 tablet by mouth every 12 (twelve) hours.  14 tablet  0  . famotidine (PEPCID) 20 MG tablet Take 20 mg by mouth 2 (two) times daily. OTC      . guaiFENesin (MUCINEX) 600 MG 12 hr tablet Take 2 tablets (1,200 mg total) by mouth 2 (two) times daily.  30 tablet  0  . lisinopril-hydrochlorothiazide (PRINZIDE,ZESTORETIC) 20-25 MG per tablet Take 1 tablet by mouth  daily.  90 tablet  3  . mometasone (NASONEX) 50 MCG/ACT nasal spray Place 2 sprays into the nose daily.  17 g  12  . Multiple Vitamin (MULTIVITAMIN WITH MINERALS) TABS Take 1 tablet by mouth daily.      Marland Kitchen omeprazole (PRILOSEC) 20 MG capsule Take 1 capsule (20 mg total) by mouth daily.  30 capsule  0  . pravastatin (PRAVACHOL) 40 MG tablet Take 1 tablet (40 mg total) by mouth daily.  90 tablet  3  . sodium chloride (OCEAN) 0.65 % nasal spray Place 4 sprays into the nose as  needed. For congestion.      . sucralfate (CARAFATE) 1 G tablet Take 1 tablet (1 g total) by mouth 4 (four) times daily.  30 tablet  0    OB/GYN History: Menarche at age 6 she is premenopausal she gets about. Suspect this month. She's had to live births first live birth was at 86.  Fertility Discussion: Patient has completed her family Prior History of Cancer: No prior history of malignancy  Health Maintenance:  Colonoscopy no Bone Density no Last PAP smear up today  ECOG PERFORMANCE STATUS: 0 - Asymptomatic  Genetic Counseling/testing: Patient's family history was reviewed at this time genetic counseling is not recommended  REVIEW OF SYSTEMS:  A comprehensive review of systems was negative.  PHYSICAL EXAMINATION: Blood pressure 113/83, pulse 76, temperature 98.3 F (36.8 C), temperature source Oral, resp. rate 20, height 5\' 3"  (1.6 m), weight 173 lb 9.6 oz (78.744 kg), last menstrual period 03/31/2012.  EAV:WUJWJ, healthy, no distress, well nourished and well developed SKIN: skin color, texture, turgor are normal HEAD: Normocephalic EYES: PERRLA, EOMI EARS: External ears normal OROPHARYNX:no exudate, no erythema and lips, buccal mucosa, and tongue normal  NECK: supple, no adenopathy LYMPH:  no palpable lymphadenopathy, no hepatosplenomegaly BREAST:left breast normal without mass, skin or nipple changes or axillary nodes, abnormal mass palpable in the right breast that is now palpable with area hematoma LUNGS: clear to auscultation and percussion HEART: regular rate & rhythm ABDOMEN:abdomen soft, non-tender, obese, normal bowel sounds and no masses or organomegaly BACK: No CVA tenderness EXTREMITIES:no edema, no clubbing, no cyanosis  NEURO: alert & oriented x 3 with fluent speech, no focal motor/sensory deficits, gait normal, reflexes normal and symmetric     STUDIES/RESULTS: Ct Head Wo Contrast  04/07/2012  *RADIOLOGY REPORT*  Clinical Data: Intermittent headaches  for 2:01 half weeks.  CT HEAD WITHOUT CONTRAST  Technique:  Contiguous axial images were obtained from the base of the skull through the vertex without contrast.  Comparison: None.  Findings: No evidence of acute intracranial abnormality including infarct, hemorrhage, mass lesion, mass effect, midline shift or abnormal extra-axial fluid collection.  No hydrocephalus or pneumocephalus.  Calvarium intact.  IMPRESSION: Negative exam.   Original Report Authenticated By: Holley Dexter, M.D.    US Breast Right  04/07/2012  *RADIOLOGY REPORT*  Clinical Data:  The patient has felt a lump in the upper portion of the right breast for 2 months.  DIGITAL DIAGNOSTIC BILATERAL MAMMOGRAM WITH CAD AND RIGHT BREAST ULTRASOUND:  Comparison:  04/19/2009  Findings:  ACR Breast Density Category 2: There are scattered fibroglandular densities.  There is an oval lobulated mass in the anterior portion of the right breast at 11-12 o'clock which corresponds to the palpable finding.  In addition, there is an oval ill-defined mass in the 9 o'clock position of the right breast posteriorly and an oval lobulated mass with microcalcifications in the right subareolar  region at approximately 11 o'clock.  There is an enlarged lymph node in the right axilla.  No abnormality is noted on the left.  Mammographic images were processed with CAD.  On physical exam, I palpate a focal thickening at 11 o'clock 3 cm from the right nipple.  There is a nodule at 11 o'clock in the right subareolar region. There is a palpable mass in the right axilla.  Ultrasound is performed, showing a lobulated mass with microcalcifications at 11 o'clock in the right  subareolar region measuring 1.6 x 1.8 x 1.4 cm.  There is a lobulated mass at 11 o'clock 3 cm from the right nipple measuring 2.4 x 1.6 x 1.2 cm. There is an ill defined oval lobulated mass at 10 o'clock 6 cm from the right nipple measuring 1.8 x 0.9 x 1.3 cm.  There is an abnormal lymph node in the right  axilla measuring 3.3 cm.  Findings are highly suspicious for multifocal invasive mammary carcinoma with axillary metastasis. Ultrasound-guided core needle biopsy of each mass and of the right axillary lymph node is recommended.  This was discussed with the patient and she agreed with this plan.  The patient will be evaluated by BCCCP (Breast and Cervical Cancer Control Program) and  biopsies will be scheduled after that evaluation is performed.  IMPRESSION: Three highly suspicious masses in the right breast with an abnormal right axillary lymph node.  Appearance is highly suspicious for multifocal invasive mammary carcinoma with axillary metastasis.  RECOMMENDATION: Ultrasound-guided core needle biopsy of each mass and of the right axillary lymph node is suggested.  This will be scheduled after the patient is evaluated by BCCCP.  I have discussed the findings and recommendations with the patient. Results were also provided in writing at the conclusion of the visit.  BI-RADS CATEGORY 5:  Highly suggestive of malignancy - appropriate action should be taken.   Original Report Authenticated By: Cain Saupe, M.D.    Mr Breast Bilateral W Wo Contrast  04/21/2012  *RADIOLOGY REPORT*  Clinical Data: Recent diagnosis of multifocal right breast cancer and metastatic disease to the right axillary lymph node.  BILATERAL BREAST MRI WITH AND WITHOUT CONTRAST  Technique: Multiplanar, multisequence MR images of both breasts were obtained prior to and following the intravenous administration of 17ml of Multihance.  Three dimensional images were evaluated at the independent DynaCad workstation.  Comparison:  No prior MR.  Right mammogram 04/16/2012.  Bilateral mammography 04/07/2012, 1/815/2011.  Right breast ultrasound 04/17/2012, 04/16/2012 (biopsy).  Findings: Mild background parenchymal enhancement.  Mass-like enhancement of all three lesions in the right breast which were previously biopsied, with clip artifact in each  lesion.  The mass at the 11 o'clock subareolar position measures approximately 1.4 x 1.3 x 1.8 cm.  The mass at the 11 o'clock position 3 cm from the nipple measures approximately 1.7 x 1.3 x 2.0 cm. The mass at the 10 o'clock position 9 cm from the nipple measures approximately 2.3 x 1.5 x 1.8 cm.  Each of these masses demonstrate a type 1 enhancement pattern.  No abnormal enhancement is identified elsewhere in the right breast.  No abnormal enhancement is identified in the left breast.  The abnormal level I right axillary lymph node which was previously biopsied measures approximately 3.0 cm maximally.  No pathologic lymphadenopathy is identified elsewhere; there are normal appearing bilateral axillary lymph nodes in addition to the pathologically enlarged node.  IMPRESSION:  1.  Multifocal right breast cancer and a pathologic right axillary  lymph node, all previously biopsied. 2.  No evidence of left breast cancer and no pathologic lymphadenopathy elsewhere.  RECOMMENDATION: Definitive treatment per Dr. Johna Sheriff.  THREE-DIMENSIONAL MR IMAGE RENDERING ON INDEPENDENT WORKSTATION:  Three-dimensional MR images were rendered by post-processing of the original MR data on an independent workstation.  The three- dimensional MR images were interpreted, and findings were reported in the accompanying complete MRI report for this study.  BI-RADS CATEGORY 6:  Known biopsy-proven malignancy - appropriate action should be taken.   Original Report Authenticated By: Hulan Saas, M.D.    Dg Abd Acute W/chest  04/07/2012  *RADIOLOGY REPORT*  Clinical Data: Left lower quadrant abdominal pain for 2-3 months.  ACUTE ABDOMEN SERIES (ABDOMEN 2 VIEW & CHEST 1 VIEW)  Comparison: None.  Findings: Normal heart size with clear lung fields.  No bony abnormality.  No effusion pneumothorax.  Nonobstructive gas pattern.  Mild stool burden.  No abnormal calcifications or osseous abnormalities.  IMPRESSION: Unremarkable acute abdomen series.    Original Report Authenticated By: Davonna Belling, M.D.    Mm Digital Diagnostic Bilat  04/07/2012  *RADIOLOGY REPORT*  Clinical Data:  The patient has felt a lump in the upper portion of the right breast for 2 months.  DIGITAL DIAGNOSTIC BILATERAL MAMMOGRAM WITH CAD AND RIGHT BREAST ULTRASOUND:  Comparison:  04/19/2009  Findings:  ACR Breast Density Category 2: There are scattered fibroglandular densities.  There is an oval lobulated mass in the anterior portion of the right breast at 11-12 o'clock which corresponds to the palpable finding.  In addition, there is an oval ill-defined mass in the 9 o'clock position of the right breast posteriorly and an oval lobulated mass with microcalcifications in the right subareolar region at approximately 11 o'clock.  There is an enlarged lymph node in the right axilla.  No abnormality is noted on the left.  Mammographic images were processed with CAD.  On physical exam, I palpate a focal thickening at 11 o'clock 3 cm from the right nipple.  There is a nodule at 11 o'clock in the right subareolar region. There is a palpable mass in the right axilla.  Ultrasound is performed, showing a lobulated mass with microcalcifications at 11 o'clock in the right  subareolar region measuring 1.6 x 1.8 x 1.4 cm.  There is a lobulated mass at 11 o'clock 3 cm from the right nipple measuring 2.4 x 1.6 x 1.2 cm. There is an ill defined oval lobulated mass at 10 o'clock 6 cm from the right nipple measuring 1.8 x 0.9 x 1.3 cm.  There is an abnormal lymph node in the right axilla measuring 3.3 cm.  Findings are highly suspicious for multifocal invasive mammary carcinoma with axillary metastasis. Ultrasound-guided core needle biopsy of each mass and of the right axillary lymph node is recommended.  This was discussed with the patient and she agreed with this plan.  The patient will be evaluated by BCCCP (Breast and Cervical Cancer Control Program) and  biopsies will be scheduled after that  evaluation is performed.  IMPRESSION: Three highly suspicious masses in the right breast with an abnormal right axillary lymph node.  Appearance is highly suspicious for multifocal invasive mammary carcinoma with axillary metastasis.  RECOMMENDATION: Ultrasound-guided core needle biopsy of each mass and of the right axillary lymph node is suggested.  This will be scheduled after the patient is evaluated by BCCCP.  I have discussed the findings and recommendations with the patient. Results were also provided in writing at the conclusion of  the visit.  BI-RADS CATEGORY 5:  Highly suggestive of malignancy - appropriate action should be taken.   Original Report Authenticated By: Cain Saupe, M.D.    US Breast Bx W Loc Dev 1st Lesion Img Bx Spec US Guide  04/17/2012  **ADDENDUM** CREATED: 04/17/2012 13:20:02  The pathology revealed invasive ductal carcinoma at the right breast 11 o'clock 3 cm from nipple, right breast 11 o'clock subareolar, right breast 10 o'clock 6 cm from nipple and in the abnormal right lymph node.  These are found to be concordant with imaging findings.  I discussed the results over the phone with the patient.  The patient states she is doing well post biopsy without complications.  Recommend MRI of the breast and surgical consultation.  The patient has appointment for MRI of the breasts at [facility] on April 21, 2012.  The patient has appointment at the multidisciplinary cancer clinic on April 23, 2012.  **END ADDENDUM** SIGNED BY: Sherian Rein, M.D.   04/16/2012  *RADIOLOGY REPORT*  Clinical Data:  Patient presents for ultrasound-guided core biopsy of the three suspicious right breast masses as well as a pathologic appearing right axillary lymph node.  ULTRASOUND GUIDED VACUUM ASSISTED CORE BIOPSY OF THE RIGHT BREAST  Comparison: Previous exams.  I met with the patient and we discussed the procedure of ultrasound- guided biopsy, including benefits and alternatives.  We  discussed the high likelihood of a successful procedure. We discussed the risks of the procedure including infection, bleeding, tissue injury, clip migration, and inadequate sampling.  Informed written consent was given.  Using sterile technique, 2% lidocaine ultrasound guidance and a 12 gauge vacuum assisted needle biopsy was performed of the targeted 2.4 cm mass at the 11 o'clock position 3 cm from the nipple using a medial to lateral approach.  At the conclusion of the procedure, a ribbon shaped tissue marker clip was deployed into the biopsy cavity.  Using sterile technique and 2% lidocaine,  a 14 gauge spring-loaded biopsy needle was then used to access the second mass at the 11 o'clock position in the subareolar location. Medial collateral approach was used the same skin incision from first biopsy. At the conclusion of the biopsy, a winged shaped metallic clip was placed into the biopsy site.  Using sterile technique and 2% lidocaine, a 14-gauge spring-loaded biopsy needle was used to access the third mass at the 10 o'clock position 6 cm from the nipple via superior to inferior approach. At the conclusion of the biopsy, a coil shaped metallic clip was placed into the biopsied mass.  Using sterile technique and 2% lidocaine, a 14-gauge spring-loaded biopsy needle was then used to access the pathologic lymph node in the right axilla. Medial to lateral approach. No metallic clip was placed into the biopsied lymph node at the conclusion of the biopsy.   Follow-up 2-view mammogram was performed and demonstrates satisfactory placement of the deployed metallic clips as described.  IMPRESSION: Ultrasound-guided biopsy of three suspicious right breast masses and a pathologic appearing right axillary lymph node.  No apparent complications.   Original Report Authenticated By: Elberta Fortis, M.D.    US Breast Bx W Loc Dev Ea Add Lesion Img Bx Spec US Guide  04/17/2012  **ADDENDUM** CREATED: 04/17/2012 13:20:02  The  pathology revealed invasive ductal carcinoma at the right breast 11 o'clock 3 cm from nipple, right breast 11 o'clock subareolar, right breast 10 o'clock 6 cm from nipple and in the abnormal right lymph node.  These are found  to be concordant with imaging findings.  I discussed the results over the phone with the patient.  The patient states she is doing well post biopsy without complications.  Recommend MRI of the breast and surgical consultation.  The patient has appointment for MRI of the breasts at [facility] on April 21, 2012.  The patient has appointment at the multidisciplinary cancer clinic on April 23, 2012.  **END ADDENDUM** SIGNED BY: Sherian Rein, M.D.   04/16/2012  *RADIOLOGY REPORT*  Clinical Data:  Patient presents for ultrasound-guided core biopsy of the three suspicious right breast masses as well as a pathologic appearing right axillary lymph node.  ULTRASOUND GUIDED VACUUM ASSISTED CORE BIOPSY OF THE RIGHT BREAST  Comparison: Previous exams.  I met with the patient and we discussed the procedure of ultrasound- guided biopsy, including benefits and alternatives.  We discussed the high likelihood of a successful procedure. We discussed the risks of the procedure including infection, bleeding, tissue injury, clip migration, and inadequate sampling.  Informed written consent was given.  Using sterile technique, 2% lidocaine ultrasound guidance and a 12 gauge vacuum assisted needle biopsy was performed of the targeted 2.4 cm mass at the 11 o'clock position 3 cm from the nipple using a medial to lateral approach.  At the conclusion of the procedure, a ribbon shaped tissue marker clip was deployed into the biopsy cavity.  Using sterile technique and 2% lidocaine,  a 14 gauge spring-loaded biopsy needle was then used to access the second mass at the 11 o'clock position in the subareolar location. Medial collateral approach was used the same skin incision from first biopsy. At the  conclusion of the biopsy, a winged shaped metallic clip was placed into the biopsy site.  Using sterile technique and 2% lidocaine, a 14-gauge spring-loaded biopsy needle was used to access the third mass at the 10 o'clock position 6 cm from the nipple via superior to inferior approach. At the conclusion of the biopsy, a coil shaped metallic clip was placed into the biopsied mass.  Using sterile technique and 2% lidocaine, a 14-gauge spring-loaded biopsy needle was then used to access the pathologic lymph node in the right axilla. Medial to lateral approach. No metallic clip was placed into the biopsied lymph node at the conclusion of the biopsy.   Follow-up 2-view mammogram was performed and demonstrates satisfactory placement of the deployed metallic clips as described.  IMPRESSION: Ultrasound-guided biopsy of three suspicious right breast masses and a pathologic appearing right axillary lymph node.  No apparent complications.   Original Report Authenticated By: Elberta Fortis, M.D.    US Breast Bx W Loc Dev Ea Add Lesion Img Bx Spec US Guide  04/17/2012  **ADDENDUM** CREATED: 04/17/2012 13:20:02  The pathology revealed invasive ductal carcinoma at the right breast 11 o'clock 3 cm from nipple, right breast 11 o'clock subareolar, right breast 10 o'clock 6 cm from nipple and in the abnormal right lymph node.  These are found to be concordant with imaging findings.  I discussed the results over the phone with the patient.  The patient states she is doing well post biopsy without complications.  Recommend MRI of the breast and surgical consultation.  The patient has appointment for MRI of the breasts at [facility] on April 21, 2012.  The patient has appointment at the multidisciplinary cancer clinic on April 23, 2012.  **END ADDENDUM** SIGNED BY: Sherian Rein, M.D.   04/16/2012  *RADIOLOGY REPORT*  Clinical Data:  Patient presents  for ultrasound-guided core biopsy of the three suspicious right breast  masses as well as a pathologic appearing right axillary lymph node.  ULTRASOUND GUIDED VACUUM ASSISTED CORE BIOPSY OF THE RIGHT BREAST  Comparison: Previous exams.  I met with the patient and we discussed the procedure of ultrasound- guided biopsy, including benefits and alternatives.  We discussed the high likelihood of a successful procedure. We discussed the risks of the procedure including infection, bleeding, tissue injury, clip migration, and inadequate sampling.  Informed written consent was given.  Using sterile technique, 2% lidocaine ultrasound guidance and a 12 gauge vacuum assisted needle biopsy was performed of the targeted 2.4 cm mass at the 11 o'clock position 3 cm from the nipple using a medial to lateral approach.  At the conclusion of the procedure, a ribbon shaped tissue marker clip was deployed into the biopsy cavity.  Using sterile technique and 2% lidocaine,  a 14 gauge spring-loaded biopsy needle was then used to access the second mass at the 11 o'clock position in the subareolar location. Medial collateral approach was used the same skin incision from first biopsy. At the conclusion of the biopsy, a winged shaped metallic clip was placed into the biopsy site.  Using sterile technique and 2% lidocaine, a 14-gauge spring-loaded biopsy needle was used to access the third mass at the 10 o'clock position 6 cm from the nipple via superior to inferior approach. At the conclusion of the biopsy, a coil shaped metallic clip was placed into the biopsied mass.  Using sterile technique and 2% lidocaine, a 14-gauge spring-loaded biopsy needle was then used to access the pathologic lymph node in the right axilla. Medial to lateral approach. No metallic clip was placed into the biopsied lymph node at the conclusion of the biopsy.   Follow-up 2-view mammogram was performed and demonstrates satisfactory placement of the deployed metallic clips as described.  IMPRESSION: Ultrasound-guided biopsy of three  suspicious right breast masses and a pathologic appearing right axillary lymph node.  No apparent complications.   Original Report Authenticated By: Elberta Fortis, M.D.    US Breast Bx W Loc Dev Ea Add Lesion Img Bx Spec US Guide  04/17/2012  **ADDENDUM** CREATED: 04/17/2012 13:20:02  The pathology revealed invasive ductal carcinoma at the right breast 11 o'clock 3 cm from nipple, right breast 11 o'clock subareolar, right breast 10 o'clock 6 cm from nipple and in the abnormal right lymph node.  These are found to be concordant with imaging findings.  I discussed the results over the phone with the patient.  The patient states she is doing well post biopsy without complications.  Recommend MRI of the breast and surgical consultation.  The patient has appointment for MRI of the breasts at [facility] on April 21, 2012.  The patient has appointment at the multidisciplinary cancer clinic on April 23, 2012.  **END ADDENDUM** SIGNED BY: Sherian Rein, M.D.   04/16/2012  *RADIOLOGY REPORT*  Clinical Data:  Patient presents for ultrasound-guided core biopsy of the three suspicious right breast masses as well as a pathologic appearing right axillary lymph node.  ULTRASOUND GUIDED VACUUM ASSISTED CORE BIOPSY OF THE RIGHT BREAST  Comparison: Previous exams.  I met with the patient and we discussed the procedure of ultrasound- guided biopsy, including benefits and alternatives.  We discussed the high likelihood of a successful procedure. We discussed the risks of the procedure including infection, bleeding, tissue injury, clip migration, and inadequate sampling.  Informed written consent was given.  Using  sterile technique, 2% lidocaine ultrasound guidance and a 12 gauge vacuum assisted needle biopsy was performed of the targeted 2.4 cm mass at the 11 o'clock position 3 cm from the nipple using a medial to lateral approach.  At the conclusion of the procedure, a ribbon shaped tissue marker clip was deployed into  the biopsy cavity.  Using sterile technique and 2% lidocaine,  a 14 gauge spring-loaded biopsy needle was then used to access the second mass at the 11 o'clock position in the subareolar location. Medial collateral approach was used the same skin incision from first biopsy. At the conclusion of the biopsy, a winged shaped metallic clip was placed into the biopsy site.  Using sterile technique and 2% lidocaine, a 14-gauge spring-loaded biopsy needle was used to access the third mass at the 10 o'clock position 6 cm from the nipple via superior to inferior approach. At the conclusion of the biopsy, a coil shaped metallic clip was placed into the biopsied mass.  Using sterile technique and 2% lidocaine, a 14-gauge spring-loaded biopsy needle was then used to access the pathologic lymph node in the right axilla. Medial to lateral approach. No metallic clip was placed into the biopsied lymph node at the conclusion of the biopsy.   Follow-up 2-view mammogram was performed and demonstrates satisfactory placement of the deployed metallic clips as described.  IMPRESSION: Ultrasound-guided biopsy of three suspicious right breast masses and a pathologic appearing right axillary lymph node.  No apparent complications.   Original Report Authenticated By: Elberta Fortis, M.D.      LABS:    Chemistry      Component Value Date/Time   NA 137 04/23/2012 1201   NA 139 04/06/2012 2214   K 3.5 04/23/2012 1201   K 3.3* 04/06/2012 2214   CL 101 04/23/2012 1201   CL 101 04/06/2012 2214   CO2 26 04/23/2012 1201   CO2 26 04/06/2012 2214   BUN 11.0 04/23/2012 1201   BUN 11 04/06/2012 2214   CREATININE 1.0 04/23/2012 1201   CREATININE 0.90 04/06/2012 2214   CREATININE 0.85 03/10/2012 1544      Component Value Date/Time   CALCIUM 10.1 04/23/2012 1201   CALCIUM 10.0 04/06/2012 2214   ALKPHOS 44 04/23/2012 1201   ALKPHOS 47 04/06/2012 2306   AST 12 04/23/2012 1201   AST 21 04/06/2012 2306   ALT 11 04/23/2012 1201   ALT 16 04/06/2012 2306   BILITOT  0.32 04/23/2012 1201   BILITOT 0.2* 04/06/2012 2306      Lab Results  Component Value Date   WBC 5.3 04/23/2012   HGB 13.0 04/23/2012   HCT 38.5 04/23/2012   MCV 85.4 04/23/2012   PLT 228 04/23/2012   PATHOLOGY:  ASSESSMENT    57 year old female with  #12 synchronous primary breast cancers in the right breast found on self breast examination. Patient also has a positive lymph node. MRI of the breast revealed a mass at the 11:00 subareolar position measuring 1.4 x 1.3 x 1.8 cm. The mass at the 11:00 position 3 cm from the nipple measuring 1.3 x 1.3 x 2.0 cm the mass at the 10:00 position measuring Center 9 cm from the nipple measuring 2.3 x 1.5 x 1.8 cm. Abnormal level I right axillary lymph node previously biopsied measured 3 cm. No other pathologically adenopathy was identified. Patient's pathology revealed at the 11:00 incision 3 cm from the nipple invasive ductal carcinoma with abundant mucin. The 11:00 subareolar mass biopsy revealed invasive carcinoma. 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 The biopsied node was positive for ductal carcinoma.  #2 do to multifocality of the disease patient is being recommended mastectomy with excellent lymph node dissection with delayed reconstruction in case there is a need for radiation therapy.  #3 adjuvantly I do plan on giving her adjuvant chemotherapy after we have her final pathology. Certainly she will need a Port-A-Cath placed. We discussed this at great length today. She demonstrated understanding. .  Clinical Trial Eligibility: No Multidisciplinary conference discussion yes    PLAN:    #1 patient will proceed with right mastectomy with excellent lymph node dissection and delayed reconstruction.  #2 she will need a Port-A-Cath placed for eventual adjuvant chemotherapy.  #3 she was seen by radiation oncology and she will need post mastectomy adjuvant radiation  therapy.  #4 I will plan on seeing the patient back after her surgery.      Thank you so much for allowing me to participate in the care of Tasha Ortiz. I will continue to follow up the patient with you and assist in her care.  All questions were answered. The patient knows to call the clinic with any problems, questions or concerns. We can certainly see the patient much sooner if necessary.  I spent 55 minutes counseling the patient face to face. The total time spent in the appointment was 60 minutes.  Drue Second, MD Medical/Oncology [facility] [phone] (beeper) [phone] (Office)  04/23/2012, 2:52 PM

## 2012-04-24 NOTE — Progress Notes (Signed)
Radiation Oncology         [phone]) [phone] ________________________________  Name: Tasha Ortiz MRN: [id number]  Date: 04/23/2012  DOB: Aug 18, 1955  WU:JWJX, Tasha Grieve, DO  Hoxworth, Tasha Skeens, MD   Tasha Ortiz, M.D.  REFERRING PHYSICIAN: Hoxworth, Tasha Skeens, MD   DIAGNOSIS: The encounter diagnosis was Cancer of upper-outer quadrant of female breast.   HISTORY OF PRESENT ILLNESS::Tasha Ortiz is a 57 y.o. female who is seen for an initial consultation visit. The patient was seen today in multidisciplinary breast clinic. The patient notes that she noted a mass within the right breast approximately 4 months ago. She indicates that she has had mammograms E. year on an annual basis and she did not have any prior suspicious findings. She presented for further workup of the palpable mass and was noted on mammogram to have a suspicious abnormality at the 11:00 position of the right breast which corresponded to this palpable finding. 2 additional suspicious masses were present within the right breast as well as an abnormal right axillary lymph node.  The patient proceeded with an ultrasound guided core biopsy of all 3 masses and the axillary lymph node. All 3 returned positive for invasive ductal carcinoma. This was carefully reviewed in breast conference this morning. There were 2 patterns consistent with 2 synchronous primaries. Both primaries appeared to be HER-2/neu negative, ER positive and PR positive. One of the primaries demonstrated mucinous features with a Ki-67 staining of 20%. The additional pattern which included the lymph node had a higher Ki-67 at 70%.  The patient also then underwent an MRI scan of the breasts bilaterally. Again seen were 3 lesions within the right breast. The larger mass at the 10:00 position measure 2.3 cm in maximum dimension. An abnormal level one right axillary lymph node which was previously biopsied measured 3.0 cm.  The patient has no additional complaints at  this time other than this palpable abnormality.  PREVIOUS RADIATION THERAPY: No   PAST MEDICAL HISTORY:  has a past medical history of Hypertension; Hypercholesteremia; Acid reflux; Breast cancer; and Heartburn.     PAST SURGICAL HISTORY: Past Surgical History  Procedure Date  . Cesarean section 25 years ago     FAMILY HISTORY: family history includes Cancer in her sister; Diabetes in her daughter; and Hypertension in her mother.   SOCIAL HISTORY:  reports that she has never smoked. She quit smokeless tobacco use about 15 years ago. Her smokeless tobacco use included Chew. She reports that she does not drink alcohol or use illicit drugs.   ALLERGIES: Review of patient's allergies indicates no known allergies.   MEDICATIONS:  Current Outpatient Prescriptions  Medication Sig Dispense Refill  . amoxicillin-clavulanate (AUGMENTIN) 875-125 MG per tablet Take 1 tablet by mouth every 12 (twelve) hours.  14 tablet  0  . famotidine (PEPCID) 20 MG tablet Take 20 mg by mouth 2 (two) times daily. OTC      . guaiFENesin (MUCINEX) 600 MG 12 hr tablet Take 2 tablets (1,200 mg total) by mouth 2 (two) times daily.  30 tablet  0  . lisinopril-hydrochlorothiazide (PRINZIDE,ZESTORETIC) 20-25 MG per tablet Take 1 tablet by mouth daily.  90 tablet  3  . mometasone (NASONEX) 50 MCG/ACT nasal spray Place 2 sprays into the nose daily.  17 g  12  . Multiple Vitamin (MULTIVITAMIN WITH MINERALS) TABS Take 1 tablet by mouth daily.      Marland Kitchen omeprazole (PRILOSEC) 20 MG capsule Take 1 capsule (20 mg total) by mouth daily.  30 capsule  0  . pravastatin (PRAVACHOL) 40 MG tablet Take 1 tablet (40 mg total) by mouth daily.  90 tablet  3  . sodium chloride (OCEAN) 0.65 % nasal spray Place 4 sprays into the nose as needed. For congestion.      . sucralfate (CARAFATE) 1 G tablet Take 1 tablet (1 g total) by mouth 4 (four) times daily.  30 tablet  0     REVIEW OF SYSTEMS:  A 15 point review of systems is documented in  the electronic medical record. This was obtained by the nursing staff. However, I reviewed this with the patient to discuss relevant findings and make appropriate changes.  Pertinent items are noted in HPI.    PHYSICAL EXAM:  vitals were not taken for this visit.  General: Well-developed, in no acute distress HEENT: Normocephalic, atraumatic; oral cavity clear Neck: Supple without any lymphadenopathy Cardiovascular: Regular rate and rhythm Respiratory: Clear to auscultation bilaterally GI: Soft, nontender, normal bowel sounds Extremities: No edema present Neuro: No focal deficits Breasts: Palpable abnormality present more superficially at the 11:00 position of the right breast. Additional fullness/palpable abnormality present deeper within the breast. No suspicious findings within the left breast. A palpable right axillary lymph node was present, none on the left.    LABORATORY DATA:  Lab Results  Component Value Date   WBC 5.3 04/23/2012   HGB 13.0 04/23/2012   HCT 38.5 04/23/2012   MCV 85.4 04/23/2012   PLT 228 04/23/2012   Lab Results  Component Value Date   NA 137 04/23/2012   K 3.5 04/23/2012   CL 101 04/23/2012   CO2 26 04/23/2012   Lab Results  Component Value Date   ALT 11 04/23/2012   AST 12 04/23/2012   ALKPHOS 44 04/23/2012   BILITOT 0.32 04/23/2012      RADIOGRAPHY: Ct Head Wo Contrast  04/07/2012  *RADIOLOGY REPORT*  Clinical Data: Intermittent headaches for 2:01 half weeks.  CT HEAD WITHOUT CONTRAST  Technique:  Contiguous axial images were obtained from the base of the skull through the vertex without contrast.  Comparison: None.  Findings: No evidence of acute intracranial abnormality including infarct, hemorrhage, mass lesion, mass effect, midline shift or abnormal extra-axial fluid collection.  No hydrocephalus or pneumocephalus.  Calvarium intact.  IMPRESSION: Negative exam.   Original Report Authenticated By: Holley Dexter, M.D.    US Breast Right  04/07/2012   *RADIOLOGY REPORT*  Clinical Data:  The patient has felt a lump in the upper portion of the right breast for 2 months.  DIGITAL DIAGNOSTIC BILATERAL MAMMOGRAM WITH CAD AND RIGHT BREAST ULTRASOUND:  Comparison:  04/19/2009  Findings:  ACR Breast Density Category 2: There are scattered fibroglandular densities.  There is an oval lobulated mass in the anterior portion of the right breast at 11-12 o'clock which corresponds to the palpable finding.  In addition, there is an oval ill-defined mass in the 9 o'clock position of the right breast posteriorly and an oval lobulated mass with microcalcifications in the right subareolar region at approximately 11 o'clock.  There is an enlarged lymph node in the right axilla.  No abnormality is noted on the left.  Mammographic images were processed with CAD.  On physical exam, I palpate a focal thickening at 11 o'clock 3 cm from the right nipple.  There is a nodule at 11 o'clock in the right subareolar region. There is a palpable mass in the right axilla.  Ultrasound is performed, showing a lobulated mass  with microcalcifications at 11 o'clock in the right  subareolar region measuring 1.6 x 1.8 x 1.4 cm.  There is a lobulated mass at 11 o'clock 3 cm from the right nipple measuring 2.4 x 1.6 x 1.2 cm. There is an ill defined oval lobulated mass at 10 o'clock 6 cm from the right nipple measuring 1.8 x 0.9 x 1.3 cm.  There is an abnormal lymph node in the right axilla measuring 3.3 cm.  Findings are highly suspicious for multifocal invasive mammary carcinoma with axillary metastasis. Ultrasound-guided core needle biopsy of each mass and of the right axillary lymph node is recommended.  This was discussed with the patient and she agreed with this plan.  The patient will be evaluated by BCCCP (Breast and Cervical Cancer Control Program) and  biopsies will be scheduled after that evaluation is performed.  IMPRESSION: Three highly suspicious masses in the right breast with an abnormal  right axillary lymph node.  Appearance is highly suspicious for multifocal invasive mammary carcinoma with axillary metastasis.  RECOMMENDATION: Ultrasound-guided core needle biopsy of each mass and of the right axillary lymph node is suggested.  This will be scheduled after the patient is evaluated by BCCCP.  I have discussed the findings and recommendations with the patient. Results were also provided in writing at the conclusion of the visit.  BI-RADS CATEGORY 5:  Highly suggestive of malignancy - appropriate action should be taken.   Original Report Authenticated By: Cain Saupe, M.D.    Mr Breast Bilateral W Wo Contrast  04/21/2012  *RADIOLOGY REPORT*  Clinical Data: Recent diagnosis of multifocal right breast cancer and metastatic disease to the right axillary lymph node.  BILATERAL BREAST MRI WITH AND WITHOUT CONTRAST  Technique: Multiplanar, multisequence MR images of both breasts were obtained prior to and following the intravenous administration of 17ml of Multihance.  Three dimensional images were evaluated at the independent DynaCad workstation.  Comparison:  No prior MR.  Right mammogram 04/16/2012.  Bilateral mammography 04/07/2012, 1/815/2011.  Right breast ultrasound 04/17/2012, 04/16/2012 (biopsy).  Findings: Mild background parenchymal enhancement.  Mass-like enhancement of all three lesions in the right breast which were previously biopsied, with clip artifact in each lesion.  The mass at the 11 o'clock subareolar position measures approximately 1.4 x 1.3 x 1.8 cm.  The mass at the 11 o'clock position 3 cm from the nipple measures approximately 1.7 x 1.3 x 2.0 cm. The mass at the 10 o'clock position 9 cm from the nipple measures approximately 2.3 x 1.5 x 1.8 cm.  Each of these masses demonstrate a type 1 enhancement pattern.  No abnormal enhancement is identified elsewhere in the right breast.  No abnormal enhancement is identified in the left breast.  The abnormal level I right axillary  lymph node which was previously biopsied measures approximately 3.0 cm maximally.  No pathologic lymphadenopathy is identified elsewhere; there are normal appearing bilateral axillary lymph nodes in addition to the pathologically enlarged node.  IMPRESSION:  1.  Multifocal right breast cancer and a pathologic right axillary lymph node, all previously biopsied. 2.  No evidence of left breast cancer and no pathologic lymphadenopathy elsewhere.  RECOMMENDATION: Definitive treatment per Dr. Johna Sheriff.  THREE-DIMENSIONAL MR IMAGE RENDERING ON INDEPENDENT WORKSTATION:  Three-dimensional MR images were rendered by post-processing of the original MR data on an independent workstation.  The three- dimensional MR images were interpreted, and findings were reported in the accompanying complete MRI report for this study.  BI-RADS CATEGORY 6:  Known biopsy-proven malignancy -  appropriate action should be taken.   Original Report Authenticated By: Hulan Saas, M.D.    Dg Abd Acute W/chest  04/07/2012  *RADIOLOGY REPORT*  Clinical Data: Left lower quadrant abdominal pain for 2-3 months.  ACUTE ABDOMEN SERIES (ABDOMEN 2 VIEW & CHEST 1 VIEW)  Comparison: None.  Findings: Normal heart size with clear lung fields.  No bony abnormality.  No effusion pneumothorax.  Nonobstructive gas pattern.  Mild stool burden.  No abnormal calcifications or osseous abnormalities.  IMPRESSION: Unremarkable acute abdomen series.   Original Report Authenticated By: Davonna Belling, M.D.    Mm Digital Diagnostic Bilat  04/07/2012  *RADIOLOGY REPORT*  Clinical Data:  The patient has felt a lump in the upper portion of the right breast for 2 months.  DIGITAL DIAGNOSTIC BILATERAL MAMMOGRAM WITH CAD AND RIGHT BREAST ULTRASOUND:  Comparison:  04/19/2009  Findings:  ACR Breast Density Category 2: There are scattered fibroglandular densities.  There is an oval lobulated mass in the anterior portion of the right breast at 11-12 o'clock which corresponds to the  palpable finding.  In addition, there is an oval ill-defined mass in the 9 o'clock position of the right breast posteriorly and an oval lobulated mass with microcalcifications in the right subareolar region at approximately 11 o'clock.  There is an enlarged lymph node in the right axilla.  No abnormality is noted on the left.  Mammographic images were processed with CAD.  On physical exam, I palpate a focal thickening at 11 o'clock 3 cm from the right nipple.  There is a nodule at 11 o'clock in the right subareolar region. There is a palpable mass in the right axilla.  Ultrasound is performed, showing a lobulated mass with microcalcifications at 11 o'clock in the right  subareolar region measuring 1.6 x 1.8 x 1.4 cm.  There is a lobulated mass at 11 o'clock 3 cm from the right nipple measuring 2.4 x 1.6 x 1.2 cm. There is an ill defined oval lobulated mass at 10 o'clock 6 cm from the right nipple measuring 1.8 x 0.9 x 1.3 cm.  There is an abnormal lymph node in the right axilla measuring 3.3 cm.  Findings are highly suspicious for multifocal invasive mammary carcinoma with axillary metastasis. Ultrasound-guided core needle biopsy of each mass and of the right axillary lymph node is recommended.  This was discussed with the patient and she agreed with this plan.  The patient will be evaluated by BCCCP (Breast and Cervical Cancer Control Program) and  biopsies will be scheduled after that evaluation is performed.  IMPRESSION: Three highly suspicious masses in the right breast with an abnormal right axillary lymph node.  Appearance is highly suspicious for multifocal invasive mammary carcinoma with axillary metastasis.  RECOMMENDATION: Ultrasound-guided core needle biopsy of each mass and of the right axillary lymph node is suggested.  This will be scheduled after the patient is evaluated by BCCCP.  I have discussed the findings and recommendations with the patient. Results were also provided in writing at the  conclusion of the visit.  BI-RADS CATEGORY 5:  Highly suggestive of malignancy - appropriate action should be taken.   Original Report Authenticated By: Cain Saupe, M.D.    US Breast Bx W Loc Dev 1st Lesion Img Bx Spec US Guide  04/17/2012  **ADDENDUM** CREATED: 04/17/2012 13:20:02  The pathology revealed invasive ductal carcinoma at the right breast 11 o'clock 3 cm from nipple, right breast 11 o'clock subareolar, right breast 10 o'clock 6 cm from nipple and  in the abnormal right lymph node.  These are found to be concordant with imaging findings.  I discussed the results over the phone with the patient.  The patient states she is doing well post biopsy without complications.  Recommend MRI of the breast and surgical consultation.  The patient has appointment for MRI of the breasts at [facility] on April 21, 2012.  The patient has appointment at the multidisciplinary cancer clinic on April 23, 2012.  **END ADDENDUM** SIGNED BY: Sherian Rein, M.D.   04/16/2012  *RADIOLOGY REPORT*  Clinical Data:  Patient presents for ultrasound-guided core biopsy of the three suspicious right breast masses as well as a pathologic appearing right axillary lymph node.  ULTRASOUND GUIDED VACUUM ASSISTED CORE BIOPSY OF THE RIGHT BREAST  Comparison: Previous exams.  I met with the patient and we discussed the procedure of ultrasound- guided biopsy, including benefits and alternatives.  We discussed the high likelihood of a successful procedure. We discussed the risks of the procedure including infection, bleeding, tissue injury, clip migration, and inadequate sampling.  Informed written consent was given.  Using sterile technique, 2% lidocaine ultrasound guidance and a 12 gauge vacuum assisted needle biopsy was performed of the targeted 2.4 cm mass at the 11 o'clock position 3 cm from the nipple using a medial to lateral approach.  At the conclusion of the procedure, a ribbon shaped tissue marker clip was deployed  into the biopsy cavity.  Using sterile technique and 2% lidocaine,  a 14 gauge spring-loaded biopsy needle was then used to access the Ortiz mass at the 11 o'clock position in the subareolar location. Medial collateral approach was used the same skin incision from first biopsy. At the conclusion of the biopsy, a winged shaped metallic clip was placed into the biopsy site.  Using sterile technique and 2% lidocaine, a 14-gauge spring-loaded biopsy needle was used to access the third mass at the 10 o'clock position 6 cm from the nipple via superior to inferior approach. At the conclusion of the biopsy, a coil shaped metallic clip was placed into the biopsied mass.  Using sterile technique and 2% lidocaine, a 14-gauge spring-loaded biopsy needle was then used to access the pathologic lymph node in the right axilla. Medial to lateral approach. No metallic clip was placed into the biopsied lymph node at the conclusion of the biopsy.   Follow-up 2-view mammogram was performed and demonstrates satisfactory placement of the deployed metallic clips as described.  IMPRESSION: Ultrasound-guided biopsy of three suspicious right breast masses and a pathologic appearing right axillary lymph node.  No apparent complications.   Original Report Authenticated By: Elberta Fortis, M.D.    US Breast Bx W Loc Dev Ea Add Lesion Img Bx Spec US Guide  04/17/2012  **ADDENDUM** CREATED: 04/17/2012 13:20:02  The pathology revealed invasive ductal carcinoma at the right breast 11 o'clock 3 cm from nipple, right breast 11 o'clock subareolar, right breast 10 o'clock 6 cm from nipple and in the abnormal right lymph node.  These are found to be concordant with imaging findings.  I discussed the results over the phone with the patient.  The patient states she is doing well post biopsy without complications.  Recommend MRI of the breast and surgical consultation.  The patient has appointment for [facility] the breasts at [facility] on April 21, 2012.  The patient has appointment at the multidisciplinary cancer clinic on April 23, 2012.  **END ADDENDUM** SIGNED BY: Sherian Rein, M.D.  04/16/2012  *RADIOLOGY REPORT*  Clinical Data:  Patient presents for ultrasound-guided core biopsy of the three suspicious right breast masses as well as a pathologic appearing right axillary lymph node.  ULTRASOUND GUIDED VACUUM ASSISTED CORE BIOPSY OF THE RIGHT BREAST  Comparison: Previous exams.  I met with the patient and we discussed the procedure of ultrasound- guided biopsy, including benefits and alternatives.  We discussed the high likelihood of a successful procedure. We discussed the risks of the procedure including infection, bleeding, tissue injury, clip migration, and inadequate sampling.  Informed written consent was given.  Using sterile technique, 2% lidocaine ultrasound guidance and a 12 gauge vacuum assisted needle biopsy was performed of the targeted 2.4 cm mass at the 11 o'clock position 3 cm from the nipple using a medial to lateral approach.  At the conclusion of the procedure, a ribbon shaped tissue marker clip was deployed into the biopsy cavity.  Using sterile technique and 2% lidocaine,  a 14 gauge spring-loaded biopsy needle was then used to access the Ortiz mass at the 11 o'clock position in the subareolar location. Medial collateral approach was used the same skin incision from first biopsy. At the conclusion of the biopsy, a winged shaped metallic clip was placed into the biopsy site.  Using sterile technique and 2% lidocaine, a 14-gauge spring-loaded biopsy needle was used to access the third mass at the 10 o'clock position 6 cm from the nipple via superior to inferior approach. At the conclusion of the biopsy, a coil shaped metallic clip was placed into the biopsied mass.  Using sterile technique and 2% lidocaine, a 14-gauge spring-loaded biopsy needle was then used to access the pathologic lymph node in the right axilla. Medial to  lateral approach. No metallic clip was placed into the biopsied lymph node at the conclusion of the biopsy.   Follow-up 2-view mammogram was performed and demonstrates satisfactory placement of the deployed metallic clips as described.  IMPRESSION: Ultrasound-guided biopsy of three suspicious right breast masses and a pathologic appearing right axillary lymph node.  No apparent complications.   Original Report Authenticated By: Elberta Fortis, M.D.    US Breast Bx W Loc Dev Ea Add Lesion Img Bx Spec US Guide  04/17/2012  **ADDENDUM** CREATED: 04/17/2012 13:20:02  The pathology revealed invasive ductal carcinoma at the right breast 11 o'clock 3 cm from nipple, right breast 11 o'clock subareolar, right breast 10 o'clock 6 cm from nipple and in the abnormal right lymph node.  These are found to be concordant with imaging findings.  I discussed the results over the phone with the patient.  The patient states she is doing well post biopsy without complications.  Recommend MRI of the breast and surgical consultation.  The patient has appointment for MRI of the breasts at [facility] on April 21, 2012.  The patient has appointment at the multidisciplinary cancer clinic on April 23, 2012.  **END ADDENDUM** SIGNED BY: Sherian Rein, M.D.   04/16/2012  *RADIOLOGY REPORT*  Clinical Data:  Patient presents for ultrasound-guided core biopsy of the three suspicious right breast masses as well as a pathologic appearing right axillary lymph node.  ULTRASOUND GUIDED VACUUM ASSISTED CORE BIOPSY OF THE RIGHT BREAST  Comparison: Previous exams.  I met with the patient and we discussed the procedure of ultrasound- guided biopsy, including benefits and alternatives.  We discussed the high likelihood of a successful procedure. We discussed the risks of the procedure including infection, bleeding, tissue injury, clip migration, and inadequate  sampling.  Informed written consent was given.  Using sterile technique, 2%  lidocaine ultrasound guidance and a 12 gauge vacuum assisted needle biopsy was performed of the targeted 2.4 cm mass at the 11 o'clock position 3 cm from the nipple using a medial to lateral approach.  At the conclusion of the procedure, a ribbon shaped tissue marker clip was deployed into the biopsy cavity.  Using sterile technique and 2% lidocaine,  a 14 gauge spring-loaded biopsy needle was then used to access the Ortiz mass at the 11 o'clock position in the subareolar location. Medial collateral approach was used the same skin incision from first biopsy. At the conclusion of the biopsy, a winged shaped metallic clip was placed into the biopsy site.  Using sterile technique and 2% lidocaine, a 14-gauge spring-loaded biopsy needle was used to access the third mass at the 10 o'clock position 6 cm from the nipple via superior to inferior approach. At the conclusion of the biopsy, a coil shaped metallic clip was placed into the biopsied mass.  Using sterile technique and 2% lidocaine, a 14-gauge spring-loaded biopsy needle was then used to access the pathologic lymph node in the right axilla. Medial to lateral approach. No metallic clip was placed into the biopsied lymph node at the conclusion of the biopsy.   Follow-up 2-view mammogram was performed and demonstrates satisfactory placement of the deployed metallic clips as described.  IMPRESSION: Ultrasound-guided biopsy of three suspicious right breast masses and a pathologic appearing right axillary lymph node.  No apparent complications.   Original Report Authenticated By: Elberta Fortis, M.D.    US Breast Bx W Loc Dev Ea Add Lesion Img Bx Spec US Guide  04/17/2012  **ADDENDUM** CREATED: 04/17/2012 13:20:02  The pathology revealed invasive ductal carcinoma at the right breast 11 o'clock 3 cm from nipple, right breast 11 o'clock subareolar, right breast 10 o'clock 6 cm from nipple and in the abnormal right lymph node.  These are found to be concordant with imaging  findings.  I discussed the results over the phone with the patient.  The patient states she is doing well post biopsy without complications.  Recommend MRI of the breast and surgical consultation.  The patient has appointment for MRI of the breasts at [facility] on April 21, 2012.  The patient has appointment at the multidisciplinary cancer clinic on April 23, 2012.  **END ADDENDUM** SIGNED BY: Sherian Rein, M.D.   04/16/2012  *RADIOLOGY REPORT*  Clinical Data:  Patient presents for ultrasound-guided core biopsy of the three suspicious right breast masses as well as a pathologic appearing right axillary lymph node.  ULTRASOUND GUIDED VACUUM ASSISTED CORE BIOPSY OF THE RIGHT BREAST  Comparison: Previous exams.  I met with the patient and we discussed the procedure of ultrasound- guided biopsy, including benefits and alternatives.  We discussed the high likelihood of a successful procedure. We discussed the risks of the procedure including infection, bleeding, tissue injury, clip migration, and inadequate sampling.  Informed written consent was given.  Using sterile technique, 2% lidocaine ultrasound guidance and a 12 gauge vacuum assisted needle biopsy was performed of the targeted 2.4 cm mass at the 11 o'clock position 3 cm from the nipple using a medial to lateral approach.  At the conclusion of the procedure, a ribbon shaped tissue marker clip was deployed into the biopsy cavity.  Using sterile technique and 2% lidocaine,  a 14 gauge spring-loaded biopsy needle was then used to access the Ortiz mass at the  11 o'clock position in the subareolar location. Medial collateral approach was used the same skin incision from first biopsy. At the conclusion of the biopsy, a winged shaped metallic clip was placed into the biopsy site.  Using sterile technique and 2% lidocaine, a 14-gauge spring-loaded biopsy needle was used to access the third mass at the 10 o'clock position 6 cm from the nipple via superior  to inferior approach. At the conclusion of the biopsy, a coil shaped metallic clip was placed into the biopsied mass.  Using sterile technique and 2% lidocaine, a 14-gauge spring-loaded biopsy needle was then used to access the pathologic lymph node in the right axilla. Medial to lateral approach. No metallic clip was placed into the biopsied lymph node at the conclusion of the biopsy.   Follow-up 2-view mammogram was performed and demonstrates satisfactory placement of the deployed metallic clips as described.  IMPRESSION: Ultrasound-guided biopsy of three suspicious right breast masses and a pathologic appearing right axillary lymph node.  No apparent complications.   Original Report Authenticated By: Elberta Fortis, M.D.        IMPRESSION: The patient is a pleasant 57 year old female with a diagnosis of multicentric invasive ductal carcinoma of the right breast. She has a total of 3 lesions in addition to a pathologically positive axillary lymph node on the right as well. The dominant lesion corresponds to a T2 N1 tumor. Both of these suspected synchronous primaries are HER-2/neu negative, ER positive, and PR positive.   PLAN: The patient is an appropriate candidate given the extent of her disease for a right-sided mastectomy. She will also undergo an axillary lymph node dissection. Adjuvantly, she will also require chemotherapy and I recommend postmastectomy radiotherapy as well.  I discussed with the patient an anticipated 6-1/2 week course of radiotherapy. We discussed the rationale of such a treatment in terms of improved local/regional control. We also discussed the potential side effects and risks of treatment. All of her questions were answered.  She does wish to proceed with this overall treatment plan. I therefore look forward to seeing her postoperatively - we will see how she is doing at that time and  can coordinate her course of radiotherapy.      ________________________________   Radene Gunning, MD, PhD

## 2012-04-27 ENCOUNTER — Encounter: Payer: Self-pay | Admitting: *Deleted

## 2012-04-28 ENCOUNTER — Telehealth: Payer: Self-pay | Admitting: Oncology

## 2012-04-28 NOTE — Telephone Encounter (Signed)
S/w the pt and she is aware of her cancelled appt that has been moved to march 2014 due to her surgery date.

## 2012-05-02 ENCOUNTER — Telehealth: Payer: Self-pay | Admitting: *Deleted

## 2012-05-02 NOTE — Telephone Encounter (Signed)
Spoke to pt concerning [facility] from 04/23/12.  Pt denies questions or concerns regarding dx or treatment care plan.  Confirmed surgery date and f/u with Dr. Welton Flakes.  Encourage pt to call with needs and to continue to attend support group.  Received verbal understanding.  Contact information given.

## 2012-05-03 HISTORY — PX: MASTECTOMY: SHX3

## 2012-05-09 ENCOUNTER — Encounter (HOSPITAL_COMMUNITY): Payer: Self-pay | Admitting: Pharmacy Technician

## 2012-05-10 ENCOUNTER — Encounter: Payer: Self-pay | Admitting: *Deleted

## 2012-05-10 NOTE — Progress Notes (Signed)
Mailed after appt letter to pt. 

## 2012-05-13 ENCOUNTER — Encounter (HOSPITAL_COMMUNITY)
Admission: RE | Admit: 2012-05-13 | Discharge: 2012-05-13 | Disposition: A | Discharge status: Home / Self Care | Payer: Medicaid Other | Source: Ambulatory Visit | Attending: General Surgery | Admitting: General Surgery

## 2012-05-13 ENCOUNTER — Encounter (HOSPITAL_COMMUNITY): Payer: Self-pay

## 2012-05-13 LAB — CBC
HCT: 39.6 % (ref 36.0–46.0)
Hemoglobin: 13.5 g/dL (ref 12.0–15.0)
MCH: 29.4 pg (ref 26.0–34.0)
MCHC: 34.1 g/dL (ref 30.0–36.0)
MCV: 86.3 fL (ref 78.0–100.0)
Platelets: 284 10*3/uL (ref 150–400)
RBC: 4.59 MIL/uL (ref 3.87–5.11)
RDW: 13.6 % (ref 11.5–15.5)
WBC: 5.4 10*3/uL (ref 4.0–10.5)

## 2012-05-13 LAB — BASIC METABOLIC PANEL
BUN: 9 mg/dL (ref 6–23)
CO2: 24 mEq/L (ref 19–32)
Calcium: 10.2 mg/dL (ref 8.4–10.5)
Chloride: 101 mEq/L (ref 96–112)
Creatinine, Ser: 0.82 mg/dL (ref 0.50–1.10)
GFR calc Af Amer: >90 mL/min (ref >90)
GFR calc non Af Amer: 80 mL/min — ABNORMAL LOW (ref >90)
Glucose, Bld: 88 mg/dL (ref 70–99)
Potassium: 3.6 mEq/L (ref 3.5–5.1)
Sodium: 138 mEq/L (ref 135–145)

## 2012-05-13 LAB — SURGICAL PCR SCREEN
MRSA, PCR: NEGATIVE
Staphylococcus aureus: NEGATIVE

## 2012-05-13 LAB — HCG, SERUM, QUALITATIVE: Preg, Serum: NEGATIVE

## 2012-05-13 MED ORDER — CHLORHEXIDINE GLUCONATE 4 % EX LIQD: 1.0000 "application " | Freq: Once | CUTANEOUS | Status: DC

## 2012-05-13 NOTE — Pre-Procedure Instructions (Signed)
Colinda Barth  05/13/2012   Your procedure is scheduled on:  Tuesday May 20, 2012  Report to [name] at 0530 AM.  Call this number if you have problems the morning of surgery: [phone]   Remember:   Do not eat food or drink liquids after midnight Monday   Take these medicines the morning of surgery with A SIP OF WATER: Famotidine[ Pepcid]   Do not wear jewelry, make-up or nail polish.  Do not wear lotions, powders, or perfumes. You may wear deodorant.  Do not shave 48 hours prior to surgery.   Do not bring valuables to the hospital.  Contacts, dentures or bridgework may not be worn into surgery.  Leave suitcase in the car. After surgery it may be brought to your room.  For patients admitted to the hospital, checkout time is 11:00 AM the day of  discharge.   Patients discharged the day of surgery will not be allowed to drive  home.    Special Instructions: Shower using CHG 2 nights before surgery and the night before surgery.  If you shower the day of surgery use CHG.  Use special wash - you have one bottle of CHG for all showers.  You should use approximately 1/3 of the bottle for each shower.   Please read over the following fact sheets that you were given: Pain Booklet, Coughing and Deep Breathing, MRSA Information and Surgical Site Infection Prevention

## 2012-05-16 ENCOUNTER — Encounter (INDEPENDENT_AMBULATORY_CARE_PROVIDER_SITE_OTHER): Payer: Self-pay

## 2012-05-19 MED ORDER — CEFAZOLIN SODIUM-DEXTROSE 2-3 GM-% IV SOLR
2.0000 g | INTRAVENOUS | Status: AC
  Administered 2012-05-20: 2 g via INTRAVENOUS
  Filled 2012-05-19: qty 50

## 2012-05-20 ENCOUNTER — Encounter (HOSPITAL_COMMUNITY)
Admission: RE | Disposition: A | Discharge status: Home / Self Care | Payer: Self-pay | Source: Ambulatory Visit | Attending: General Surgery

## 2012-05-20 ENCOUNTER — Ambulatory Visit (HOSPITAL_COMMUNITY): Payer: Medicaid Other

## 2012-05-20 ENCOUNTER — Ambulatory Visit (HOSPITAL_COMMUNITY): Payer: Medicaid Other | Admitting: Anesthesiology

## 2012-05-20 ENCOUNTER — Observation Stay (HOSPITAL_COMMUNITY)
Admission: RE | Admit: 2012-05-20 | Discharge: 2012-05-21 | Disposition: A | Discharge status: Home / Self Care | Payer: Medicaid Other | Source: Ambulatory Visit | Attending: General Surgery | Admitting: General Surgery

## 2012-05-20 ENCOUNTER — Encounter (HOSPITAL_COMMUNITY): Payer: Self-pay | Admitting: Anesthesiology

## 2012-05-20 DIAGNOSIS — I10 Essential (primary) hypertension: Secondary | ICD-10-CM | POA: Insufficient documentation

## 2012-05-20 DIAGNOSIS — C773 Secondary and unspecified malignant neoplasm of axilla and upper limb lymph nodes: Secondary | ICD-10-CM | POA: Insufficient documentation

## 2012-05-20 DIAGNOSIS — Z01812 Encounter for preprocedural laboratory examination: Secondary | ICD-10-CM | POA: Insufficient documentation

## 2012-05-20 DIAGNOSIS — Z01818 Encounter for other preprocedural examination: Secondary | ICD-10-CM | POA: Insufficient documentation

## 2012-05-20 DIAGNOSIS — C50419 Malignant neoplasm of upper-outer quadrant of unspecified female breast: Principal | ICD-10-CM

## 2012-05-20 DIAGNOSIS — D0590 Unspecified type of carcinoma in situ of unspecified breast: Secondary | ICD-10-CM

## 2012-05-20 HISTORY — PX: BREAST SURGERY: SHX581

## 2012-05-20 HISTORY — PX: MODIFIED MASTECTOMY: SHX5268

## 2012-05-20 HISTORY — PX: PORTACATH PLACEMENT: SHX2246

## 2012-05-20 SURGERY — MODIFIED MASTECTOMY
Anesthesia: General | Site: Chest | Laterality: Right | Wound class: Clean

## 2012-05-20 MED ORDER — SODIUM BICARBONATE 4 % IV SOLN
INTRAVENOUS | Status: AC
  Filled 2012-05-20: qty 5

## 2012-05-20 MED ORDER — FENTANYL CITRATE 0.05 MG/ML IJ SOLN: 50.0000 ug | Freq: Once | INTRAMUSCULAR | Status: DC

## 2012-05-20 MED ORDER — LISINOPRIL-HYDROCHLOROTHIAZIDE 20-25 MG PO TABS: 1.0000 | ORAL_TABLET | Freq: Every day | ORAL | Status: DC

## 2012-05-20 MED ORDER — HYDROCHLOROTHIAZIDE 25 MG PO TABS
25.0000 mg | ORAL_TABLET | Freq: Every day | ORAL | Status: DC
  Administered 2012-05-20 – 2012-05-21 (×2): 25 mg via ORAL
  Filled 2012-05-20 (×2): qty 1

## 2012-05-20 MED ORDER — PHENYLEPHRINE HCL 10 MG/ML IJ SOLN
INTRAMUSCULAR | Status: DC | PRN
  Administered 2012-05-20: 40 ug via INTRAVENOUS
  Administered 2012-05-20: 80 ug via INTRAVENOUS

## 2012-05-20 MED ORDER — MIDAZOLAM HCL 5 MG/5ML IJ SOLN
INTRAMUSCULAR | Status: DC | PRN
  Administered 2012-05-20 (×2): 1 mg via INTRAVENOUS

## 2012-05-20 MED ORDER — ONDANSETRON HCL 4 MG PO TABS: 4.0000 mg | ORAL_TABLET | Freq: Four times a day (QID) | ORAL | Status: DC | PRN

## 2012-05-20 MED ORDER — BUPIVACAINE-EPINEPHRINE 0.25% -1:200000 IJ SOLN
INTRAMUSCULAR | Status: AC
  Filled 2012-05-20: qty 1

## 2012-05-20 MED ORDER — MIDAZOLAM HCL 2 MG/2ML IJ SOLN: 1.0000 mg | INTRAMUSCULAR | Status: DC | PRN

## 2012-05-20 MED ORDER — PROMETHAZINE HCL 25 MG/ML IJ SOLN
6.2500 mg | INTRAMUSCULAR | Status: DC | PRN
  Administered 2012-05-20: 6.25 mg via INTRAVENOUS

## 2012-05-20 MED ORDER — HYDROMORPHONE HCL PF 1 MG/ML IJ SOLN
INTRAMUSCULAR | Status: AC
  Filled 2012-05-20: qty 2

## 2012-05-20 MED ORDER — HEPARIN SOD (PORK) LOCK FLUSH 100 UNIT/ML IV SOLN
INTRAVENOUS | Status: AC
  Filled 2012-05-20: qty 5

## 2012-05-20 MED ORDER — LIDOCAINE HCL (PF) 1 % IJ SOLN
INTRAMUSCULAR | Status: AC
  Filled 2012-05-20: qty 30

## 2012-05-20 MED ORDER — ONDANSETRON HCL 4 MG/2ML IJ SOLN
INTRAMUSCULAR | Status: DC | PRN
  Administered 2012-05-20: 4 mg via INTRAVENOUS

## 2012-05-20 MED ORDER — ONDANSETRON HCL 4 MG/2ML IJ SOLN: 4.0000 mg | Freq: Four times a day (QID) | INTRAMUSCULAR | Status: DC | PRN

## 2012-05-20 MED ORDER — HEPARIN SOD (PORK) LOCK FLUSH 100 UNIT/ML IV SOLN
INTRAVENOUS | Status: DC | PRN
  Administered 2012-05-20: 500 [IU] via INTRAVENOUS

## 2012-05-20 MED ORDER — GUAIFENESIN ER 600 MG PO TB12
1200.0000 mg | ORAL_TABLET | Freq: Two times a day (BID) | ORAL | Status: DC | PRN
  Filled 2012-05-20: qty 2

## 2012-05-20 MED ORDER — PANTOPRAZOLE SODIUM 40 MG PO TBEC
40.0000 mg | DELAYED_RELEASE_TABLET | Freq: Every day | ORAL | Status: DC
  Administered 2012-05-20 – 2012-05-21 (×2): 40 mg via ORAL
  Filled 2012-05-20 (×2): qty 1

## 2012-05-20 MED ORDER — LIDOCAINE HCL (CARDIAC) 20 MG/ML IV SOLN
INTRAVENOUS | Status: DC | PRN
  Administered 2012-05-20: 90 mg via INTRAVENOUS

## 2012-05-20 MED ORDER — HEPARIN SODIUM (PORCINE) 5000 UNIT/ML IJ SOLN
5000.0000 [IU] | Freq: Three times a day (TID) | INTRAMUSCULAR | Status: DC
  Administered 2012-05-20 – 2012-05-21 (×2): 5000 [IU] via SUBCUTANEOUS
  Filled 2012-05-20 (×6): qty 1

## 2012-05-20 MED ORDER — SALINE NASAL SPRAY 0.65 % NA SOLN
4.0000 | NASAL | Status: DC | PRN
  Filled 2012-05-20: qty 1.8

## 2012-05-20 MED ORDER — SIMVASTATIN 20 MG PO TABS
20.0000 mg | ORAL_TABLET | Freq: Every day | ORAL | Status: DC
  Filled 2012-05-20 (×2): qty 1

## 2012-05-20 MED ORDER — SODIUM CHLORIDE 0.9 % IR SOLN
Status: DC | PRN
  Administered 2012-05-20: 08:00:00

## 2012-05-20 MED ORDER — FENTANYL CITRATE 0.05 MG/ML IJ SOLN
INTRAMUSCULAR | Status: DC | PRN
  Administered 2012-05-20: 25 ug via INTRAVENOUS
  Administered 2012-05-20: 75 ug via INTRAVENOUS
  Administered 2012-05-20 (×5): 25 ug via INTRAVENOUS
  Administered 2012-05-20: 75 ug via INTRAVENOUS
  Administered 2012-05-20: 25 ug via INTRAVENOUS

## 2012-05-20 MED ORDER — MORPHINE SULFATE 2 MG/ML IJ SOLN
INTRAMUSCULAR | Status: AC
  Administered 2012-05-20: 2 mg via INTRAVENOUS
  Filled 2012-05-20: qty 1

## 2012-05-20 MED ORDER — LACTATED RINGERS IV SOLN
INTRAVENOUS | Status: DC | PRN
  Administered 2012-05-20: 07:00:00 via INTRAVENOUS

## 2012-05-20 MED ORDER — LISINOPRIL 20 MG PO TABS
20.0000 mg | ORAL_TABLET | Freq: Every day | ORAL | Status: DC
  Administered 2012-05-20 – 2012-05-21 (×2): 20 mg via ORAL
  Filled 2012-05-20 (×2): qty 1

## 2012-05-20 MED ORDER — HYDROMORPHONE HCL PF 1 MG/ML IJ SOLN
0.2500 mg | INTRAMUSCULAR | Status: DC | PRN
  Administered 2012-05-20 (×4): 0.5 mg via INTRAVENOUS

## 2012-05-20 MED ORDER — OXYCODONE HCL 5 MG PO TABS: 5.0000 mg | ORAL_TABLET | Freq: Once | ORAL | Status: DC | PRN

## 2012-05-20 MED ORDER — 0.9 % SODIUM CHLORIDE (POUR BTL) OPTIME
TOPICAL | Status: DC | PRN
  Administered 2012-05-20 (×2): 1000 mL

## 2012-05-20 MED ORDER — PROPOFOL 10 MG/ML IV BOLUS
INTRAVENOUS | Status: DC | PRN
  Administered 2012-05-20: 200 mg via INTRAVENOUS

## 2012-05-20 MED ORDER — MORPHINE SULFATE 2 MG/ML IJ SOLN
2.0000 mg | INTRAMUSCULAR | Status: DC | PRN
  Administered 2012-05-20: 2 mg via INTRAVENOUS

## 2012-05-20 MED ORDER — DEXAMETHASONE SODIUM PHOSPHATE 4 MG/ML IJ SOLN
INTRAMUSCULAR | Status: DC | PRN
  Administered 2012-05-20: 4 mg via INTRAVENOUS

## 2012-05-20 MED ORDER — ARTIFICIAL TEARS OP OINT
TOPICAL_OINTMENT | OPHTHALMIC | Status: DC | PRN
  Administered 2012-05-20: 1 via OPHTHALMIC

## 2012-05-20 MED ORDER — PROMETHAZINE HCL 25 MG/ML IJ SOLN
INTRAMUSCULAR | Status: AC
  Filled 2012-05-20: qty 1

## 2012-05-20 MED ORDER — OXYCODONE HCL 5 MG/5ML PO SOLN: 5.0000 mg | Freq: Once | ORAL | Status: DC | PRN

## 2012-05-20 MED ORDER — OXYCODONE-ACETAMINOPHEN 5-325 MG PO TABS
1.0000 | ORAL_TABLET | ORAL | Status: DC | PRN
  Administered 2012-05-20 – 2012-05-21 (×3): 2 via ORAL
  Filled 2012-05-20 (×3): qty 2

## 2012-05-20 MED ORDER — KCL IN DEXTROSE-NACL 20-5-0.9 MEQ/L-%-% IV SOLN
INTRAVENOUS | Status: DC
  Administered 2012-05-20 – 2012-05-21 (×2): via INTRAVENOUS
  Filled 2012-05-20 (×3): qty 1000

## 2012-05-20 SURGICAL SUPPLY — 79 items
BAG DECANTER FOR FLEXI CONT (MISCELLANEOUS) ×3 IMPLANT
BINDER BREAST LRG (GAUZE/BANDAGES/DRESSINGS) IMPLANT
BINDER BREAST XLRG (GAUZE/BANDAGES/DRESSINGS) IMPLANT
BLADE SURG 15 STRL LF DISP TIS (BLADE) ×2 IMPLANT
BLADE SURG 15 STRL SS (BLADE) ×1
CANISTER SUCTION 2500CC (MISCELLANEOUS) ×3 IMPLANT
CHLORAPREP W/TINT 10.5 ML (MISCELLANEOUS) ×3 IMPLANT
CHLORAPREP W/TINT 26ML (MISCELLANEOUS) ×3 IMPLANT
CLIP TI MEDIUM 6 (CLIP) ×12 IMPLANT
CLOTH BEACON ORANGE TIMEOUT ST (SAFETY) ×3 IMPLANT
COVER SURGICAL LIGHT HANDLE (MISCELLANEOUS) ×3 IMPLANT
CRADLE DONUT ADULT HEAD (MISCELLANEOUS) ×3 IMPLANT
DECANTER SPIKE VIAL GLASS SM (MISCELLANEOUS) IMPLANT
DERMABOND ADVANCED (GAUZE/BANDAGES/DRESSINGS) ×1
DERMABOND ADVANCED .7 DNX12 (GAUZE/BANDAGES/DRESSINGS) ×2 IMPLANT
DRAIN CHANNEL 19F RND (DRAIN) ×6 IMPLANT
DRAPE C-ARM 42X72 X-RAY (DRAPES) ×3 IMPLANT
DRAPE CHEST BREAST 15X10 FENES (DRAPES) ×3 IMPLANT
DRAPE LAPAROSCOPIC ABDOMINAL (DRAPES) ×3 IMPLANT
DRAPE SURG 17X23 STRL (DRAPES) ×3 IMPLANT
DRAPE UTILITY 15X26 W/TAPE STR (DRAPE) ×9 IMPLANT
ELECT CAUTERY BLADE 6.4 (BLADE) ×6 IMPLANT
ELECT REM PT RETURN 9FT ADLT (ELECTROSURGICAL) ×3
ELECTRODE REM PT RTRN 9FT ADLT (ELECTROSURGICAL) ×2 IMPLANT
EVACUATOR SILICONE 100CC (DRAIN) ×6 IMPLANT
GAUZE SPONGE 4X4 16PLY XRAY LF (GAUZE/BANDAGES/DRESSINGS) ×3 IMPLANT
GLOVE BIO SURGEON STRL SZ7.5 (GLOVE) ×3 IMPLANT
GLOVE BIOGEL PI IND STRL 7.0 (GLOVE) ×8 IMPLANT
GLOVE BIOGEL PI IND STRL 7.5 (GLOVE) ×2 IMPLANT
GLOVE BIOGEL PI IND STRL 8 (GLOVE) ×2 IMPLANT
GLOVE BIOGEL PI INDICATOR 7.0 (GLOVE) ×4
GLOVE BIOGEL PI INDICATOR 7.5 (GLOVE) ×1
GLOVE BIOGEL PI INDICATOR 8 (GLOVE) ×1
GLOVE ECLIPSE 6.5 STRL STRAW (GLOVE) ×6 IMPLANT
GLOVE SS BIOGEL STRL SZ 6.5 (GLOVE) ×2 IMPLANT
GLOVE SS BIOGEL STRL SZ 7.5 (GLOVE) ×4 IMPLANT
GLOVE SUPERSENSE BIOGEL SZ 6.5 (GLOVE) ×1
GLOVE SUPERSENSE BIOGEL SZ 7.5 (GLOVE) ×2
GLOVE SURG SIGNA 7.5 PF LTX (GLOVE) ×3 IMPLANT
GOWN PREVENTION PLUS XLARGE (GOWN DISPOSABLE) ×9 IMPLANT
GOWN STRL NON-REIN LRG LVL3 (GOWN DISPOSABLE) ×12 IMPLANT
INTRODUCER COOK 11FR (CATHETERS) IMPLANT
IV CATH 14GX2 1/4 (CATHETERS) ×3 IMPLANT
KIT BASIN OR (CUSTOM PROCEDURE TRAY) ×3 IMPLANT
KIT PORT POWER 8FR ISP CVUE (Catheter) ×3 IMPLANT
KIT PORT POWER 9.6FR MRI PREA (Catheter) IMPLANT
KIT PORT POWER ISP 8FR (Catheter) IMPLANT
KIT POWER CATH 8FR (Catheter) IMPLANT
KIT ROOM TURNOVER OR (KITS) ×3 IMPLANT
NEEDLE 22X1 1/2 (OR ONLY) (NEEDLE) ×3 IMPLANT
NEEDLE HYPO 25GX1X1/2 BEV (NEEDLE) ×3 IMPLANT
NS IRRIG 1000ML POUR BTL (IV SOLUTION) ×3 IMPLANT
PACK GENERAL/GYN (CUSTOM PROCEDURE TRAY) ×3 IMPLANT
PACK SURGICAL SETUP 50X90 (CUSTOM PROCEDURE TRAY) ×3 IMPLANT
PAD ARMBOARD 7.5X6 YLW CONV (MISCELLANEOUS) ×6 IMPLANT
PENCIL BUTTON HOLSTER BLD 10FT (ELECTRODE) ×3 IMPLANT
SET INTRODUCER 12FR PACEMAKER (SHEATH) IMPLANT
SET SHEATH INTRODUCER 10FR (MISCELLANEOUS) IMPLANT
SHEATH COOK PEEL AWAY SET 9F (SHEATH) IMPLANT
SPECIMEN JAR X LARGE (MISCELLANEOUS) ×6 IMPLANT
SPONGE GAUZE 4X4 12PLY (GAUZE/BANDAGES/DRESSINGS) ×3 IMPLANT
STAPLER VISISTAT 35W (STAPLE) ×6 IMPLANT
SUT ETHILON 2 0 FS 18 (SUTURE) ×6 IMPLANT
SUT MON AB 5-0 PS2 18 (SUTURE) ×6 IMPLANT
SUT PROLENE 2 0 SH DA (SUTURE) ×3 IMPLANT
SUT SILK 2 0 (SUTURE)
SUT SILK 2-0 18XBRD TIE 12 (SUTURE) IMPLANT
SUT VIC AB 3-0 54X BRD REEL (SUTURE) ×2 IMPLANT
SUT VIC AB 3-0 BRD 54 (SUTURE) ×1
SUT VIC AB 3-0 SH 18 (SUTURE) ×6 IMPLANT
SUT VIC AB 3-0 SH 27 (SUTURE) ×1
SUT VIC AB 3-0 SH 27XBRD (SUTURE) ×2 IMPLANT
SYR 20ML ECCENTRIC (SYRINGE) ×6 IMPLANT
SYR 5ML LUER SLIP (SYRINGE) ×3 IMPLANT
SYR BULB 3OZ (MISCELLANEOUS) ×3 IMPLANT
SYR CONTROL 10ML LL (SYRINGE) ×3 IMPLANT
TOWEL OR 17X24 6PK STRL BLUE (TOWEL DISPOSABLE) ×3 IMPLANT
TOWEL OR 17X26 10 PK STRL BLUE (TOWEL DISPOSABLE) ×3 IMPLANT
WATER STERILE IRR 1000ML POUR (IV SOLUTION) IMPLANT

## 2012-05-20 NOTE — H&P (View-Only) (Signed)
Subjective:  New  diagnosis cancer of the right breast  Patient ID: Tasha Ortiz, female   DOB: 10/01/1955, 57 y.o.   MRN: [id number]  HPI Patient is a very pleasant 56-year-old female referred by Dr. Boyle at the breast center for a new diagnosis of right breast cancer. The patient states that she was initially able to feel a lump in her right breast approximately 3 months ago. She then presented to her primary M.D. And was referred to the breast center for imaging and diagnosis. I have personally reviewed all her imaging and pathology. Mammogram revealed an oval mass at the 11:00 position of the right breast corresponding to the palpable abnormality. In addition to further masses were seen at the 9:00 position of the right breast posteriorly and another subareolar mass at 11:00 as well as an enlarged axillary lymph node. Subsequent ultrasound-guided core biopsy was recommended and performed of all 3 masses in the breast and the axillary lymph node. These biopsies were all positive for invasive ductal carcinoma. 2 other breast masses showed grade 2 mucinous ER PR positive invasive ductal carcinoma with a low proliferation rate of 20%. One of the breast masses and the lymph node show a nonmucinous invasive ductal carcinoma also ER/PR positive and HER-2/neu negative with a higher proliferation rate of 70%. The patient is here today with her daughter to discuss treatment. She obviously continues to feel the mass but has had no other symptoms such as unusual pain or nipple discharge or bleeding or skin changes. She has no previous history of breast disease. She does not have any significant family history of breast cancer.  Past Medical History  Diagnosis Date  . Hypertension   . Hypercholesteremia     taken off chol meds Dec 2012  . Acid reflux   . Breast cancer   . Heartburn    Past Surgical History  Procedure Date  . Cesarean section 25 years ago   .cmed No Known Allergies History  Substance  Use Topics  . Smoking status: Never Smoker   . Smokeless tobacco: Former User    Types: Chew    Quit date: 11/18/1996  . Alcohol Use: No   family history includes Cancer in her sister; Diabetes in her daughter; and Hypertension in her mother.   Review of Systems  Constitutional: Negative.   HENT: Positive for rhinorrhea and postnasal drip.   Eyes: Negative.   Respiratory: Negative.   Cardiovascular: Negative.   Gastrointestinal: Negative.   Genitourinary: Negative.   Musculoskeletal: Negative.   Neurological: Negative.        Objective:   Physical Exam General: Alert, well-developed African American female, in no distress Skin: Warm and dry without rash or infection. HEENT: No palpable masses or thyromegaly. Sclera nonicteric. Pupils equal round and reactive. Oropharynx clear. Lymph nodes: No cervical, supraclavicular, or inguinal nodes palpable. There is an approximately 3 cm freely movable mass or lymph node palpable in the right axilla. Breasts: There is a easily palpable superficial mass periareolar at the 11:00 position of the right breast. Probably somewhat less distinct palpable mass deep in the breast at the 11:00 position. No skin changes or nipple inversion. Lungs: Breath sounds clear and equal without increased work of breathing Cardiovascular: Regular rate and rhythm without murmur. No JVD or edema. Peripheral pulses intact. Abdomen: Nondistended. Soft and nontender. No masses palpable. No organomegaly. No palpable hernias. Extremities: No edema or joint swelling or deformity. No chronic venous stasis changes. Neurologic: Alert and fully oriented. Gait   normal.     Assessment:     New diagnosis of T2 N1 ER PR positive HER-2 negative multicentric invasive ductal carcinoma of the right breast. I discussed the nature of her diagnosis with the patient and her daughters. Due to multiple tumors within the breast I have recommended total mastectomy. She has documented  axillary disease and I would plan axillary dissection. The patient will require chemotherapy and I also recommended placement of a Port-A-Cath at the time of her mastectomy. We discussed the nature of the surgery including use of general anesthesia and risks of bleeding, infection, anesthetic complications, pneumothorax and catheter displacement thrombosis. We discussed expected recovery. She understands the need for total mastectomy versus breast conservation. We discussed that reconstruction would be an option for her but that we would recommend delayed reconstruction due to the likely need for postoperative radiation. All her questions were answered.    Plan:     Right modified mastectomy and placement of Port-A-Cath under general anesthesia with overnight hospitalization.      

## 2012-05-20 NOTE — Anesthesia Procedure Notes (Signed)
Procedure Name: LMA Insertion Date/Time: 05/20/2012 7:21 AM Performed by: Gayla Medicus Pre-anesthesia Checklist: Patient identified, Timeout performed, Suction available, Emergency Drugs available and Patient being monitored Patient Re-evaluated:Patient Re-evaluated prior to inductionOxygen Delivery Method: Circle system utilized Preoxygenation: Pre-oxygenation with 100% oxygen Intubation Type: IV induction LMA: LMA with gastric port inserted LMA Size: 4.0 Number of attempts: 1 Placement Confirmation: positive ETCO2 and breath sounds checked- equal and bilateral Tube secured with: Tape Dental Injury: Teeth and Oropharynx as per pre-operative assessment

## 2012-05-20 NOTE — Interval H&P Note (Signed)
History and Physical Interval Note:  05/20/2012 7:10 AM  Tasha Ortiz  has presented today for surgery, with the diagnosis of right breast cancer  The various methods of treatment have been discussed with the patient and family. After consideration of risks, benefits and other options for treatment, the patient has consented to  Procedure(s): MODIFIED MASTECTOMY (Right) INSERTION PORT-A-CATH (N/A) as a surgical intervention .  The patient's history has been reviewed, patient examined, no change in status, stable for surgery.  I have reviewed the patient's chart and labs.  Questions were answered to the patient's satisfaction.     Deon Duer T

## 2012-05-20 NOTE — Op Note (Signed)
Preoperative Diagnosis: right breast cancer  Postoprative Diagnosis: right breast cancer  Procedure: Procedure(s): MODIFIED MASTECTOMY RIGHT INSERTION PORT-A-CATH   Surgeon: Glenna Fellows T   Assistants: Ovidio Kin  Anesthesia:  General endotracheal anesthesiaDiagnos  Indications:   Patient is a 57 year old female with a recent diagnosis of T2 N1 invasive ER/PR HER-2 negative ductal carcinoma of the right breast. She has multicentric disease with 3 biopsy-proven tumors in the right breast and an enlarged axillary lymph node positive for metastatic carcinoma core biopsy. We have recommended proceeding with right modified mastectomy and placement of a Port-A-Cath. The indications and nature of the procedures and risks have been discussed in detailed extensively elsewhere.  Procedure Detail:   patient was brought to the operating room, placed in the supine position on the operating table, and general endotracheal anesthesia induced. The mastectomy was approached initially. The right breast and chest wall and axilla and arm were widely sterilely prepped and draped. She received preoperative IV antibiotics. PAS port in place. Patient timeout was performed and correct procedure verified. An oblique elliptical incision was made encompassing the nipple areolar complex. Dissection was carried down to the subcutaneous tissue. The skin and subcutaneous flaps were then raised superiorly toward the clavicle medially to the edge of the sternum inferiorly to the inframammary crease and laterally out to the anterior border of the latissimus dorsi. After complete development of the skin flaps the breast was then reflected up off the pectoralis major working medial to lateral. The lateral border of the pectoralis major was identified and freed. The specimen was dissected off of the serratus muscles. The pectoralis was retracted medially and the pectoralis minor and clavipectoral fascia were identified. The  inter pectoral nodes and fatty tissue were reflected laterally and the pectoralis minor was retracted laterally. The axillary vein was identified. Beginning at the medial border of the pectoralis minor all fibrofatty tissue inferior to the axillary vein was swept laterally and inferiorly. Feeding branches of the axillary artery and vein were divided between clips. As the dissection progressed laterally the long thoracic nerve and the thoracodorsal nerve and vascular bundles were identified and carefully protected. All tissue between these structures down to the subscapularis was swept inferiorly. The final attachments along the latissimus and serratus were then divided and the specimen removed. This was oriented and sent for permanent pathology. The wound was thoroughly irrigated and complete hemostasis obtained. 2 closed suction drains were left through separate stab wounds. The subcutaneous tissue was closed with interrupted 3-0 Vicryl and the skin with staples. The Port-A-Cath was then approached. The left chest wall and neck and shoulder and upper arm were widely sterilely prepped and draped. Patient timeout and correct procedure were again verified. The left subclavian vein was then easily cannulated with a needle and guidewire. This was confirmed by fluoroscopy. The introducer was placed over the guidewire the flush catheter placed via the introducer which was stripped away and the tip of the catheter positioned in the superior vena cava. This was confirmed by fluoroscopy. A small transverse incision was made on the anterior chest wall and the catheter was tunneled subcutaneously into the pocket and attached to the port which was sutured to the anterior chest wall with interrupted Prolene. Fluoroscopy again showed good position of the catheter tip in the SVC. The subcutaneous was closed with interrupted 3-0 Vicryl and the skin with subcuticular Monocryl and Dermabond. The port was accessed and flushed and  aspirated easily it was left flushed with concentrated heparin solution. Dry  sterile dressings were applied. Sponge and needle  counts were correct.   Estimated Blood Loss:  less than 50 mL         Drains: 19 round Blake drain x2  Blood Given: none          Specimens: right total mastectomy and axillary contents        Complications:  * No complications entered in OR log *         Disposition: PACU - hemodynamically stable.         Condition: stable  Mariella Saa MD, FACS  05/20/2012, 9:44 AM

## 2012-05-20 NOTE — Preoperative (Signed)
Beta Blockers   Reason not to administer Beta Blockers: not prescribed 

## 2012-05-20 NOTE — Transfer of Care (Signed)
Immediate Anesthesia Transfer of Care Note  Patient: Tasha Ortiz  Procedure(s) Performed: Procedure(s): MODIFIED MASTECTOMY (Right) INSERTION PORT-A-CATH (Left)  Patient Location: PACU  Anesthesia Type:General  Level of Consciousness: awake, alert  and oriented  Airway & Oxygen Therapy: Patient Spontanous Breathing and Patient connected to nasal cannula oxygen  Post-op Assessment: Report given to PACU RN, Post -op Vital signs reviewed and stable and Patient moving all extremities X 4  Post vital signs: Reviewed and stable  Complications: No apparent anesthesia complications

## 2012-05-20 NOTE — Anesthesia Preprocedure Evaluation (Signed)
Anesthesia Evaluation  Patient identified by MRN, date of birth, ID band Patient awake    Reviewed: Allergy & Precautions, H&P , NPO status , Patient's Chart, lab work & pertinent test results  Airway Mallampati: II TM Distance: >3 FB Neck ROM: Full    Dental   Pulmonary  breath sounds clear to auscultation        Cardiovascular hypertension, Rhythm:Regular Rate:Normal     Neuro/Psych  Headaches,    GI/Hepatic GERD-  ,  Endo/Other    Renal/GU      Musculoskeletal   Abdominal (+) + obese,   Peds  Hematology   Anesthesia Other Findings   Reproductive/Obstetrics Breast ca                           Anesthesia Physical Anesthesia Plan  ASA: II  Anesthesia Plan: General   Post-op Pain Management:    Induction: Intravenous  Airway Management Planned: LMA  Additional Equipment:   Intra-op Plan:   Post-operative Plan: Extubation in OR  Informed Consent: I have reviewed the patients History and Physical, chart, labs and discussed the procedure including the risks, benefits and alternatives for the proposed anesthesia with the patient or authorized representative who has indicated his/her understanding and acceptance.     Plan Discussed with: CRNA and Surgeon  Anesthesia Plan Comments:         Anesthesia Quick Evaluation

## 2012-05-20 NOTE — Anesthesia Postprocedure Evaluation (Signed)
  Anesthesia Post-op Note  Patient: Tasha Ortiz  Procedure(s) Performed: Procedure(s) (LRB): MODIFIED MASTECTOMY (Right) INSERTION PORT-A-CATH (Left)  Patient Location: PACU  Anesthesia Type: General  Level of Consciousness: awake and alert   Airway and Oxygen Therapy: Patient Spontanous Breathing  Post-op Pain: mild  Post-op Assessment: Post-op Vital signs reviewed, Patient's Cardiovascular Status Stable, Respiratory Function Stable, Patent Airway and No signs of Nausea or vomiting  Last Vitals:  Filed Vitals:   05/20/12 1045  BP: 150/83  Pulse: 81  Temp:   Resp: 19    Post-op Vital Signs: stable   Complications: No apparent anesthesia complications

## 2012-05-21 ENCOUNTER — Ambulatory Visit: Payer: Self-pay | Admitting: Adult Health

## 2012-05-21 LAB — BASIC METABOLIC PANEL
BUN: 10 mg/dL (ref 6–23)
CO2: 25 mEq/L (ref 19–32)
Calcium: 9.1 mg/dL (ref 8.4–10.5)
Chloride: 104 mEq/L (ref 96–112)
Creatinine, Ser: 0.89 mg/dL (ref 0.50–1.10)
GFR calc Af Amer: 84 mL/min — ABNORMAL LOW (ref >90)
GFR calc non Af Amer: 72 mL/min — ABNORMAL LOW (ref >90)
Glucose, Bld: 156 mg/dL — ABNORMAL HIGH (ref 70–99)
Potassium: 4.2 mEq/L (ref 3.5–5.1)
Sodium: 138 mEq/L (ref 135–145)

## 2012-05-21 LAB — CBC
HCT: 35.5 % — ABNORMAL LOW (ref 36.0–46.0)
Hemoglobin: 11.7 g/dL — ABNORMAL LOW (ref 12.0–15.0)
MCH: 28.5 pg (ref 26.0–34.0)
MCHC: 33 g/dL (ref 30.0–36.0)
MCV: 86.4 fL (ref 78.0–100.0)
Platelets: 254 10*3/uL (ref 150–400)
RBC: 4.11 MIL/uL (ref 3.87–5.11)
RDW: 13.8 % (ref 11.5–15.5)
WBC: 11.4 10*3/uL — ABNORMAL HIGH (ref 4.0–10.5)

## 2012-05-21 MED ORDER — OXYCODONE-ACETAMINOPHEN 5-325 MG PO TABS: 1.0000 | ORAL_TABLET | ORAL | Status: DC | PRN

## 2012-05-21 NOTE — Progress Notes (Signed)
Discharge instructions gone over with patient. Home medications gone over. Prescription given. Drain care reviewed. Follow up appointment is made. Diet , activity, and incisional care gone over. Signs and symptoms of infection and when to call the doctor gone over. Patient verbalized understanding of instructions.

## 2012-05-21 NOTE — Discharge Summary (Signed)
   Patient ID: Tasha Ortiz [id number] 57 y.o. 1955/12/15  05/20/2012  Discharge date and time: 05/21/2012   Admitting Physician: Glenna Fellows T  Discharge Physician: Glenna Fellows T  Admission Diagnoses: right breast cancer  Discharge Diagnoses: same  Operations: Procedure(s): MODIFIED MASTECTOMY INSERTION PORT-A-CATH  Admission Condition: good  Discharged Condition: good  Indication for Admission: patient is a 57 year old female with a recent diagnosis of multicentric cancer of the right breast as well as a biopsy-proven axillary nodal metastasis. We have recommended proceeding with right modified mastectomy and Port-A-Cath placement and the patient is admitted on the morning of her procedure.  Hospital Course: on the morning of admission the patient underwent an uneventful right modified mastectomy and placement of Port-A-Cath via left subclavian vein. She tolerated the procedure well. Her postoperative course was smooth and uncomplicated. Postoperative chest x-ray showed good position of her Port-A-Cath. On the first postoperative morning she is alert and comfortable with minimal discomfort. Vital signs are within normal limits. CBC is unremarkable. Exam shows a dry intact dressing without any evidence of swelling or bleeding her Port-A-Cath site is clean. She is felt ready for discharge.  Disposition: Home  Patient Instructions:    Medication List    TAKE these medications       famotidine 20 MG tablet  Commonly known as:  PEPCID  Take 20 mg by mouth 2 (two) times daily.     guaiFENesin 600 MG 12 hr tablet  Commonly known as:  MUCINEX  Take 1,200 mg by mouth 2 (two) times daily as needed. For cough     lisinopril-hydrochlorothiazide 20-25 MG per tablet  Commonly known as:  PRINZIDE,ZESTORETIC  Take 1 tablet by mouth daily.     oxyCODONE-acetaminophen 5-325 MG per tablet  Commonly known as:  PERCOCET/ROXICET  Take 1-2 tablets by mouth every 4 (four)  hours as needed.     pravastatin 40 MG tablet  Commonly known as:  PRAVACHOL  Take 40 mg by mouth daily.     sodium chloride 0.65 % nasal spray  Commonly known as:  OCEAN  Place 4 sprays into the nose daily as needed. For congestion.        Activity: avoid extending or lifting right arm Diet: regular diet Wound Care: she is instructed how to empty and record JP drainage twice daily  Follow-up:  With Dr. Johna Sheriff in 1 week.  Signed: Mariella Saa MD, FACS  05/21/2012, 7:33 AM

## 2012-05-23 ENCOUNTER — Encounter (HOSPITAL_COMMUNITY): Payer: Self-pay | Admitting: General Surgery

## 2012-05-26 ENCOUNTER — Telehealth (INDEPENDENT_AMBULATORY_CARE_PROVIDER_SITE_OTHER): Payer: Self-pay | Admitting: General Surgery

## 2012-05-26 NOTE — Telephone Encounter (Signed)
Pt called to ask about her drains.  She had been counseled to call when drainage was less that 30 cc for 3 consequecutive days, but she has appt with MD on Thursday.  Asking if OK to wait until then to have them removed, as transportation is an issue.  Reassured pt this will be fine.

## 2012-05-29 ENCOUNTER — Encounter (INDEPENDENT_AMBULATORY_CARE_PROVIDER_SITE_OTHER): Payer: Self-pay | Admitting: General Surgery

## 2012-05-29 ENCOUNTER — Ambulatory Visit (INDEPENDENT_AMBULATORY_CARE_PROVIDER_SITE_OTHER): Payer: Medicaid Other | Admitting: General Surgery

## 2012-05-29 VITALS — BP 132/76 | HR 70 | Temp 98.4°F | Resp 18 | Ht 63.0 in | Wt 168.0 lb

## 2012-05-29 DIAGNOSIS — C50411 Malignant neoplasm of upper-outer quadrant of right female breast: Secondary | ICD-10-CM

## 2012-05-29 DIAGNOSIS — C50419 Malignant neoplasm of upper-outer quadrant of unspecified female breast: Secondary | ICD-10-CM

## 2012-05-29 NOTE — Progress Notes (Signed)
History: Patient returns for her first postop visit following right modified mastectomy for multifocal cancer of the right breast with a large positive axillary nodal preop biopsy. She had Port-A-Cath placement as well. She reports no particular problems.  Exam: BP 132/76  Pulse 70  Temp(Src) 98.4 F (36.9 C)  Resp 18  Ht 5\' 3"  (1.6 m)  Wt 168 lb (76.204 kg)  BMI 29.77 kg/m2  LMP 04/22/2012 General: Appears well Wounds: Right mastectomy wound is well-healed. We removed half the staples. The medial drain has minimal output it is removed. The lateral drain is still draining about 25 cc per day and this was left in place.  Assessment plan: Doing well following mastectomy. She is to return next week to have the other drain and the remainder of her staples removed. She has a followup appointment at the cancer Center within the next week.

## 2012-05-31 DIAGNOSIS — Z9221 Personal history of antineoplastic chemotherapy: Secondary | ICD-10-CM

## 2012-05-31 HISTORY — DX: Personal history of antineoplastic chemotherapy: Z92.21

## 2012-06-02 ENCOUNTER — Ambulatory Visit (HOSPITAL_BASED_OUTPATIENT_CLINIC_OR_DEPARTMENT_OTHER): Payer: Medicaid Other | Admitting: Oncology

## 2012-06-02 ENCOUNTER — Telehealth: Payer: Self-pay | Admitting: Oncology

## 2012-06-02 ENCOUNTER — Encounter: Payer: Self-pay | Admitting: Oncology

## 2012-06-02 VITALS — BP 119/90 | HR 87 | Temp 99.8°F | Resp 20 | Ht 63.0 in | Wt 170.8 lb

## 2012-06-02 DIAGNOSIS — C50411 Malignant neoplasm of upper-outer quadrant of right female breast: Secondary | ICD-10-CM

## 2012-06-02 DIAGNOSIS — Z170 Estrogen receptor positive status [ER+]: Secondary | ICD-10-CM

## 2012-06-02 DIAGNOSIS — C50419 Malignant neoplasm of upper-outer quadrant of unspecified female breast: Secondary | ICD-10-CM

## 2012-06-02 MED ORDER — DEXAMETHASONE 4 MG PO TABS: ORAL_TABLET | ORAL | Status: DC

## 2012-06-02 MED ORDER — LIDOCAINE-PRILOCAINE 2.5-2.5 % EX CREA: TOPICAL_CREAM | CUTANEOUS | Status: DC | PRN

## 2012-06-02 MED ORDER — ONDANSETRON HCL 8 MG PO TABS: 8.0000 mg | ORAL_TABLET | Freq: Two times a day (BID) | ORAL | Status: DC | PRN

## 2012-06-02 MED ORDER — LORAZEPAM 0.5 MG PO TABS: 0.5000 mg | ORAL_TABLET | Freq: Four times a day (QID) | ORAL | Status: DC | PRN

## 2012-06-02 MED ORDER — PROCHLORPERAZINE MALEATE 10 MG PO TABS: 10.0000 mg | ORAL_TABLET | Freq: Four times a day (QID) | ORAL | Status: DC | PRN

## 2012-06-02 MED ORDER — PROCHLORPERAZINE 25 MG RE SUPP: 25.0000 mg | Freq: Two times a day (BID) | RECTAL | Status: DC | PRN

## 2012-06-02 NOTE — Patient Instructions (Addendum)
Proceed with chemotherapy on 3/25  Chemo class

## 2012-06-02 NOTE — Progress Notes (Signed)
OFFICE PROGRESS NOTE  CC  Tasha Cranker, DO [address] [place] [address] Dr. Glenna Fellows Dr. Dorothy Puffer  DIAGNOSIS: 57 year old female with new diagnosis of right breast cancer that is stage IIB ER positive PR positive HER-2/neu negative  STAGE:  Cancer of upper-outer quadrant of female breast  Primary site: Breast (Right)  Staging method: AJCC 7th Edition  Clinical: Stage IIB (T2, N1, cM0)  Summary: Stage IIB (T2, N1, cM0)  PRIOR THERAPY:  #1 .Who is seen in the multidisciplinary breast clinic today for consultation. Patient noted a mass in the right breast about 4 months ago. She's had a mammogram every year on an annual basis. On her prior mammogram there were no suspicious findings. But because of the mass she presented for workup for the palpable mass. On mammogram there was noted to be an abnormality at the 11:00 position in the right breast corresponding to the palpable abnormality. There were also noted to be 2 additional suspicious masses within the right breast as well as abnormal right axillary lymph nodes. Patient went on to have an ultrasound-guided core needle biopsy of all 3 masses and the axillary lymph node. All 3 masses were positive for invasive ductal carcinoma. 2 primaries appear to be HER-2/neu negative ER positive PR +1 of the primaries demonstrated mucinous features with Ki-67 20%. The lymph node had a higher Ki-67 of 70%. Indicating there were 2 synchronous primaries. Patient underwent MRI of the breasts. Again 3 lesions were noted within the right breast. The larger mass was at the 10:00 position measuring 2.3 cm. An abnormal level I right axillary lymph node measures 3.0 cm. No abnormalities in the contralateral breast  #2 vision is now status post right modified radical mastectomy. She was found to have multifocal disease. Tumor was ER PR positive HER-2/neu negative. She had one of 17 lymph nodes positive for metastatic disease. We discussed  her pathology in detail today.  #3 patient is a good candidate for adjuvant chemotherapy. We discussed Taxotere Cytoxan every 3 weeks for a total of 6 cycles. Hopefully we can begin this towards the end of March. I discussed the side effects risks and benefits of it. She will need to radiation therapy and she will be referred back to radiation oncology after she completes her chemotherapy..   CURRENT THERAPY:patient will proceed with adjuvant chemotherapy consisting of Taxotere and Cytoxan beginning towards the end of March.  INTERVAL HISTORY: Tasha Ortiz 57 y.o. female returns for followup visit today. Clinically she's doing well she is without any complaints. She does still have a JP drain in. She has no nausea or vomiting no fevers chills or night sweats. She has no myalgias and arthralgias. She tolerated her surgery well. Remainder of the 10 point review of systems is negative.  MEDICAL HISTORY: Past Medical History  Diagnosis Date  . Hypertension   . Hypercholesteremia     taken off chol meds Dec 2012  . Acid reflux   . Breast cancer   . Heartburn     ALLERGIES:  has No Known Allergies.  MEDICATIONS:  Current Outpatient Prescriptions  Medication Sig Dispense Refill  . lisinopril-hydrochlorothiazide (PRINZIDE,ZESTORETIC) 20-25 MG per tablet Take 1 tablet by mouth daily.      Marland Kitchen oxyCODONE-acetaminophen (PERCOCET/ROXICET) 5-325 MG per tablet Take 1-2 tablets by mouth every 4 (four) hours as needed.  40 tablet  0  . pravastatin (PRAVACHOL) 40 MG tablet Take 40 mg by mouth daily.      . sodium  chloride (OCEAN) 0.65 % nasal spray Place 4 sprays into the nose daily as needed. For congestion.      . famotidine (PEPCID) 20 MG tablet Take 20 mg by mouth 2 (two) times daily.      Marland Kitchen guaiFENesin (MUCINEX) 600 MG 12 hr tablet Take 1,200 mg by mouth 2 (two) times daily as needed. For cough       No current facility-administered medications for this visit.    SURGICAL HISTORY:  Past  Surgical History  Procedure Laterality Date  . Cesarean section  25 years ago  . Tubal ligation    . Mastectomy Right   . Modified mastectomy Right 05/20/2012    Procedure: MODIFIED MASTECTOMY;  Surgeon: Mariella Saa, MD;  Location: [facility] OR;  Service: General;  Laterality: Right;  . Portacath placement Left 05/20/2012    Procedure: INSERTION PORT-A-CATH;  Surgeon: Mariella Saa, MD;  Location: [facility] OR;  Service: General;  Laterality: Left;    REVIEW OF SYSTEMS:  Pertinent items are noted in HPI.   HEALTH MAINTENANCE:   PHYSICAL EXAMINATION: Blood pressure 119/90, pulse 87, temperature 99.8 F (37.7 C), temperature source Oral, resp. rate 20, height 5\' 3"  (1.6 m), weight 170 lb 12.8 oz (77.474 kg), last menstrual period 04/22/2012. Body mass index is 30.26 kg/(m^2). ECOG PERFORMANCE STATUS: 0 - Asymptomatic   General appearance: alert, cooperative and appears stated age Resp: clear to auscultation bilaterally Back: symmetric, no curvature. ROM normal. No CVA tenderness. Cardio: regular rate and rhythm GI: soft, non-tender; bowel sounds normal; no masses,  no organomegaly Extremities: extremities normal, atraumatic, no cyanosis or edema Neurologic: Grossly normal   LABORATORY DATA: Lab Results  Component Value Date   WBC 11.4* 05/21/2012   HGB 11.7* 05/21/2012   HCT 35.5* 05/21/2012   MCV 86.4 05/21/2012   PLT 254 05/21/2012      Chemistry      Component Value Date/Time   NA 138 05/21/2012 0515   NA 137 04/23/2012 1201   K 4.2 05/21/2012 0515   K 3.5 04/23/2012 1201   CL 104 05/21/2012 0515   CL 101 04/23/2012 1201   CO2 25 05/21/2012 0515   CO2 26 04/23/2012 1201   BUN 10 05/21/2012 0515   BUN 11.0 04/23/2012 1201   CREATININE 0.89 05/21/2012 0515   CREATININE 1.0 04/23/2012 1201   CREATININE 0.85 03/10/2012 1544      Component Value Date/Time   CALCIUM 9.1 05/21/2012 0515   CALCIUM 10.1 04/23/2012 1201   ALKPHOS 44 04/23/2012 1201   ALKPHOS 47 04/06/2012 2306   AST 12  04/23/2012 1201   AST 21 04/06/2012 2306   ALT 11 04/23/2012 1201   ALT 16 04/06/2012 2306   BILITOT 0.32 04/23/2012 1201   BILITOT 0.2* 04/06/2012 2306     REASON FOR ADDENDUM, AMENDMENT OR CORRECTION: [id number]: Modification of the tumor staging. 05/28/12 08:14:17 AM (gt) ADDITIONAL INFORMATION: CHROMOGENIC IN-SITU HYBRIDIZATION (Block 1G) Interpretation HER-2/NEU BY CISH - NO AMPLIFICATION OF HER-2 DETECTED. THE RATIO OF HER-2: CEP 17 SIGNALS WAS 0.93. Reference range: Ratio: HER2:CEP17 < 1.8 - gene amplification not observed Ratio: HER2:CEP 17 1.8-2.2 - equivocal result Ratio: HER2:CEP17 > 2.2 - gene amplification observed Jimmy Picket MD Pathologist, Electronic Signature ( Signed 05/28/2012) CHROMOGENIC IN-SITU HYBRIDIZATION (Block 1D) Interpretation HER-2/NEU BY CISH - NO AMPLIFICATION OF HER-2 DETECTED. THE RATIO OF HER-2: CEP 17 SIGNALS WAS 1.15. Reference range: Ratio: HER2:CEP17 < 1.8 - gene amplification not observed Ratio: HER2:CEP 17 1.8-2.2 - equivocal result  Ratio: HER2:CEP17 > 2.2 - gene amplification observed Jimmy Picket MD 1 of 4 Amended copy Amended FINAL for NEISES, Kynedi [phone]) ADDITIONAL INFORMATION:(continued) Pathologist, Electronic Signature ( Signed 05/28/2012) CHROMOGENIC IN-SITU HYBRIDIZATION (Block 1A) Interpretation HER-2/NEU BY CISH - NO AMPLIFICATION OF HER-2 DETECTED. THE RATIO OF HER-2: CEP 17 SIGNALS WAS 1.28. Reference range: Ratio: HER2:CEP17 < 1.8 - gene amplification not observed Ratio: HER2:CEP 17 1.8-2.2 - equivocal result Ratio: HER2:CEP17 > 2.2 - gene amplification observed Jimmy Picket MD Pathologist, Electronic Signature ( Signed 05/28/2012) FINAL DIAGNOSIS Diagnosis Breast, modified radical mastectomy , Right - TUMOR #1 (COIL CLIP) - INVASIVE DUCTAL CARCINOMA, GRADE II (1.9 CM), SEE COMMENT. - INVASIVE CARCINOMA IS 4 CM FROM NEAREST MARGIN (DEEP). - DUCTAL CARCINOMA IN SITU, GRADE III WITH COMEDO NECROSIS  AND CALCIFICATIONS. - TUMOR #2 (RIBBON CLIP) - INVASIVE DUCTAL CARCINOMA WITH EXTRAVASATED MUCIN (COLLOID CARCINOMA) (2.5 CM), SEE COMMENT. - INVASIVE TUMOR IS 3 CM FROM NEAREST MARGIN (DEEP). - TUMOR #3 (WING CLIP) - INVASIVE DUCTAL CARCINOMA WITH EXTRAVASATED MUCIN (COLLOID CARCINOMA) (1.8 CM). - INVASIVE TUMOR IS 1.8 CM FROM NEAREST MARGIN (DEEP). - ONE LYMPH NODE, POSITIVE FOR METASTATIC MAMMARY CARCINOMA (1/17). Microscopic Comment BREAST, INVASIVE TUMOR, WITH LYMPH NODE SAMPLING Specimen, including laterality: Right breast Procedure: Modified radial mastectomy Grade: #1 = Grade II; #2 = Grade III; #3 = Grade III Tubule formation: 2;3;3 Nuclear pleomorphism: 3;3;3 Mitotic: 2;2;2 Tumor size (gross measurement): Tumor #1 = 1.9 cm; tumor #2 = 3.0 cm; tumor #3 = 1.8 cm 2 of 4 Amended copy Amended FINAL for Hallas, Laira [phone]) Microscopic Comment(continued) Margins: Invasive, distance to closest margin: 4 cm; 3 cm; 1.8 cm In-situ, distance to closest margin: 4.0 cm; N/A; N/A If margin positive, focally or broadly: N/A Lymphovascular invasion: Present Ductal carcinoma in situ: Present Grade: III Extensive intraductal component: Absent Lobular neoplasia: Absent Tumor focality: Multifocal Treatment effect: None If present, treatment effect in breast tissue, lymph nodes or both: N/A Extent of tumor: Skin: Grossly negative Nipple: Grossly negative Skeletal muscle: N/A Lymph nodes: # examined: 17 Lymph nodes with metastasis: 1 Isolated tumor cells (< 0.2 mm): 0 Micrometastasis: (> 0.2 mm and < 2.0 mm): 0 Macrometastasis: (> 2.0 mm): At least 1.7 cm, see comment Extracapsular extension: Absent see comment Breast prognostic profile: Estrogen receptor: Not repeated, previous studies demonstrated 100% and 100% of positivity ([id number]) Progesterone receptor: Not repeated, previous studies demonstrated 100% and 99% of positivity ([id number]) Her 2 neu: Repeated,  previous study demonstrated no amplification (0.94 and 1.28) ([id number]) Ki-67: Not repeated, previous studies demonstrated 19% and 77% proliferation rate ([id number]) Non-neoplastic breast: Fibrocystic change, pseudoangiomatous hyperplasia, microcalcifications, and fibroadenomatoid nodules. TNM: mpT2, pN1a, pMX Comments: The largest lymph node (3 cm) is the one involved by metastatic mammary carcinoma (slide 1V). The lymph node was submitted in multiple sections for review. The metastatic tumor deposits range from 1.4 to 1.7 cm in these sections. Definitive evidence of extracapsular invasion is not identified. (CRR:caf 05/21/12)  RADIOGRAPHIC STUDIES:  Dg Chest 2 View  05/13/2012  *RADIOLOGY REPORT*  Clinical Data: Breast cancer.  CHEST - 2 VIEW  Comparison: Acute abdominal series 04/06/2012  Findings: Heart, mediastinal, and hilar contours are within normal limits.  There is atherosclerotic calcification of the transverse aortic arch.  The lungs are well expanded and clear.  No acute osseous abnormality.  Visualized upper abdominal bowel gas pattern is normal.  Small bony density associated with the right shoulder may be a small loose body or subacromial spurring.  IMPRESSION: No acute  cardiopulmonary disease.   Original Report Authenticated By: Britta Mccreedy, M.D.    Dg Chest Port 1 View  05/20/2012  *RADIOLOGY REPORT*  Clinical Data: Port placement.  PORTABLE CHEST - 1 VIEW  Comparison: 05/13/2012  Findings: Left subclavian Port-A-Cath has been placed with the tip in the SVC.  Postsurgical changes in the right chest wall. Surgical drain in place.  Lungs are clear.  Heart is normal size. No effusions or pneumothorax.  IMPRESSION: Left Port-A-Cath tip in the SVC.  No pneumothorax.  Postoperative changes in the right chest wall.  No acute cardiopulmonary disease.   Original Report Authenticated By: Charlett Nose, M.D.    Dg Fluoro Guide Cv Line-no Report  05/20/2012  CLINICAL DATA: port a cath  insertion   FLOURO GUIDE CV LINE  Fluoroscopy was utilized by the requesting physician.  No radiographic  interpretation.      ASSESSMENT: 57 year old female with  #1 stage II a multifocal invasive ductal carcinoma of the right breast status post mastectomy with axillary lymph node dissection. Postoperatively she is doing well.  #2 patient will be treated with adjuvant chemotherapy consisting of Taxotere Cytoxan every 3 weeks for a total of 6 cycles. Risks and benefits of treatment were discussed with the patient in detail. She re: has a Port-A-Cath placed. I will get chemotherapy teaching class. Her orders were placed and the system today and all of her antiemetics percent to her pharmacy.   PLAN:   #1 chemotherapy teaching class.  #2 begin adjuvant chemotherapy starting on 06/24/2012 to   All questions were answered. The patient knows to call the clinic with any problems, questions or concerns. We can certainly see the patient much sooner if necessary.  I spent 25 minutes counseling the patient face to face. The total time spent in the appointment was 30 minutes .  Drue Second, MD Medical/Oncology [facility] [phone] (beeper) [phone] (Office)  06/02/2012, 11:54 AM

## 2012-06-04 ENCOUNTER — Other Ambulatory Visit: Payer: Self-pay | Admitting: Certified Registered Nurse Anesthetist

## 2012-06-04 ENCOUNTER — Ambulatory Visit (INDEPENDENT_AMBULATORY_CARE_PROVIDER_SITE_OTHER): Payer: Medicaid Other

## 2012-06-04 ENCOUNTER — Encounter (INDEPENDENT_AMBULATORY_CARE_PROVIDER_SITE_OTHER): Payer: Self-pay

## 2012-06-04 VITALS — BP 126/74 | Ht 63.0 in | Wt 169.4 lb

## 2012-06-04 DIAGNOSIS — Z4802 Encounter for removal of sutures: Secondary | ICD-10-CM

## 2012-06-04 NOTE — Progress Notes (Signed)
Patient comes in s/p modified mastectomy on 2/18 by Dr Johna Sheriff to have the remaining staples and drain removed. She has drained less than 15 cc the last 3 days. The drain was removed without difficulty and dry guaze applied to area. Al remaining staples were removed. Steri strips were already applied to incision. Incision was dressed with an ABD pad. I gave her a follow up appointment with Dr Johna Sheriff in 2 weeks.

## 2012-06-10 ENCOUNTER — Encounter: Payer: Self-pay | Admitting: Oncology

## 2012-06-10 NOTE — Progress Notes (Signed)
Called Social services and spoke with Ms. Derrill Kay- patient effective 01/20/12-03/21/13.

## 2012-06-12 ENCOUNTER — Other Ambulatory Visit: Payer: Self-pay | Admitting: Oncology

## 2012-06-17 ENCOUNTER — Telehealth: Payer: Self-pay | Admitting: Oncology

## 2012-06-17 ENCOUNTER — Encounter: Payer: Self-pay | Admitting: *Deleted

## 2012-06-17 ENCOUNTER — Other Ambulatory Visit: Payer: Medicaid Other

## 2012-06-17 NOTE — Telephone Encounter (Signed)
Pt came in today to get tx changed. Per ched nurse tx schedule was wrong. Last order sent 3/3. Written orders on pof do not match care and schedule is somewhat different from both sets of orders. Printed pof/orders and gv to desk nurse to clarify orders. Pt aware she will keep appt for 3/25 @ 8:30am and tx when be adjusted when I received clarity on orders. Pt to get new schedule when she comes back in on 3/25.

## 2012-06-19 ENCOUNTER — Other Ambulatory Visit: Payer: Self-pay | Admitting: Adult Health

## 2012-06-20 ENCOUNTER — Ambulatory Visit (INDEPENDENT_AMBULATORY_CARE_PROVIDER_SITE_OTHER): Payer: Medicaid Other | Admitting: General Surgery

## 2012-06-20 ENCOUNTER — Telehealth: Payer: Self-pay | Admitting: Emergency Medicine

## 2012-06-20 ENCOUNTER — Encounter (INDEPENDENT_AMBULATORY_CARE_PROVIDER_SITE_OTHER): Payer: Self-pay | Admitting: General Surgery

## 2012-06-20 ENCOUNTER — Telehealth (INDEPENDENT_AMBULATORY_CARE_PROVIDER_SITE_OTHER): Payer: Self-pay

## 2012-06-20 VITALS — BP 126/88 | HR 66 | Temp 96.8°F | Resp 16 | Ht 63.0 in | Wt 173.4 lb

## 2012-06-20 DIAGNOSIS — C50411 Malignant neoplasm of upper-outer quadrant of right female breast: Secondary | ICD-10-CM

## 2012-06-20 DIAGNOSIS — C50419 Malignant neoplasm of upper-outer quadrant of unspecified female breast: Secondary | ICD-10-CM

## 2012-06-20 NOTE — Telephone Encounter (Signed)
Order for Breast Imaging signed & faxed to [facility] @ [phone], fax confirmation rec'd and attached to outgoing fax.

## 2012-06-20 NOTE — Progress Notes (Signed)
History: Patient returns for more long-term followup status post right modified mastectomy. She reports no problems. She is starting chemotherapy next week.  Exam: She appears well. The mastectomy wound is well healed without infection or fluid or other complication. Port-A-Cath site looks fine. She is pretty good range of motion of her arm and is working on her exercises.  Assessment and plan: Doing well follow modified mastectomy Port-A-Cath placement. To start chemotherapy next week. I will see her back in 6 months or sooner if necessary.

## 2012-06-23 ENCOUNTER — Telehealth: Payer: Self-pay | Admitting: Oncology

## 2012-06-23 NOTE — Telephone Encounter (Signed)
appts adjusted per instruction given by LA. Pt having T/C q3w w/lb-LA starting 3/25. Pt also needs inj day after tx and lb/LA 1wk after each tx. Pt was made aware on 3/18 that message was sent to desk nurse/LA to clarify orders. Pt was instructed to keep next appt as scheduled for 3/25 @ 8:30am and schedule would be adjusted. lmonvm for pt confirming appt for tomorrow 3/25 @ 8:30am and that schedule is complete and pt can get new copy when she comes in.

## 2012-06-24 ENCOUNTER — Ambulatory Visit (HOSPITAL_BASED_OUTPATIENT_CLINIC_OR_DEPARTMENT_OTHER): Payer: Medicaid Other | Admitting: Oncology

## 2012-06-24 ENCOUNTER — Encounter: Payer: Self-pay | Admitting: Oncology

## 2012-06-24 ENCOUNTER — Ambulatory Visit (HOSPITAL_BASED_OUTPATIENT_CLINIC_OR_DEPARTMENT_OTHER): Payer: Medicaid Other

## 2012-06-24 ENCOUNTER — Other Ambulatory Visit (HOSPITAL_BASED_OUTPATIENT_CLINIC_OR_DEPARTMENT_OTHER): Payer: Medicaid Other | Admitting: Lab

## 2012-06-24 ENCOUNTER — Ambulatory Visit: Payer: Medicaid Other

## 2012-06-24 VITALS — BP 136/82 | HR 69 | Temp 98.5°F | Resp 20 | Ht 63.0 in | Wt 173.5 lb

## 2012-06-24 VITALS — BP 150/85 | HR 67 | Temp 98.4°F | Resp 20

## 2012-06-24 DIAGNOSIS — C50419 Malignant neoplasm of upper-outer quadrant of unspecified female breast: Secondary | ICD-10-CM

## 2012-06-24 DIAGNOSIS — C50411 Malignant neoplasm of upper-outer quadrant of right female breast: Secondary | ICD-10-CM

## 2012-06-24 DIAGNOSIS — Z170 Estrogen receptor positive status [ER+]: Secondary | ICD-10-CM

## 2012-06-24 DIAGNOSIS — Z5111 Encounter for antineoplastic chemotherapy: Secondary | ICD-10-CM

## 2012-06-24 LAB — CBC WITH DIFFERENTIAL/PLATELET
BASO%: 0.6 % (ref 0.0–2.0)
Basophils Absolute: 0 10*3/uL (ref 0.0–0.1)
EOS%: 2.8 % (ref 0.0–7.0)
Eosinophils Absolute: 0.1 10*3/uL (ref 0.0–0.5)
HCT: 37.5 % (ref 34.8–46.6)
HGB: 12.5 g/dL (ref 11.6–15.9)
LYMPH%: 40.3 % (ref 14.0–49.7)
MCH: 28.5 pg (ref 25.1–34.0)
MCHC: 33.3 g/dL (ref 31.5–36.0)
MCV: 85.4 fL (ref 79.5–101.0)
MONO#: 0.5 10*3/uL (ref 0.1–0.9)
MONO%: 9.8 % (ref 0.0–14.0)
NEUT#: 2.4 10*3/uL (ref 1.5–6.5)
NEUT%: 46.5 % (ref 38.4–76.8)
Platelets: 226 10*3/uL (ref 145–400)
RBC: 4.39 10*6/uL (ref 3.70–5.45)
RDW: 13.7 % (ref 11.2–14.5)
WBC: 5.1 10*3/uL (ref 3.9–10.3)
lymph#: 2.1 10*3/uL (ref 0.9–3.3)
nRBC: 0 % (ref 0–0)

## 2012-06-24 LAB — COMPREHENSIVE METABOLIC PANEL (CC13)
ALT: 17 U/L (ref 0–55)
AST: 17 U/L (ref 5–34)
Albumin: 4 g/dL (ref 3.5–5.0)
Alkaline Phosphatase: 54 U/L (ref 40–150)
BUN: 8.9 mg/dL (ref 7.0–26.0)
CO2: 26 mEq/L (ref 22–29)
Calcium: 10.2 mg/dL (ref 8.4–10.4)
Chloride: 103 mEq/L (ref 98–107)
Creatinine: 0.8 mg/dL (ref 0.6–1.1)
Glucose: 99 mg/dl (ref 70–99)
Potassium: 3.3 mEq/L — ABNORMAL LOW (ref 3.5–5.1)
Sodium: 140 mEq/L (ref 136–145)
Total Bilirubin: 0.31 mg/dL (ref 0.20–1.20)
Total Protein: 7.8 g/dL (ref 6.4–8.3)

## 2012-06-24 MED ORDER — SODIUM CHLORIDE 0.9 % IJ SOLN
10.0000 mL | INTRAMUSCULAR | Status: DC | PRN
  Administered 2012-06-24: 10 mL
  Filled 2012-06-24: qty 10

## 2012-06-24 MED ORDER — SODIUM CHLORIDE 0.9 % IV SOLN
Freq: Once | INTRAVENOUS | Status: AC
  Administered 2012-06-24: 10:00:00 via INTRAVENOUS

## 2012-06-24 MED ORDER — SODIUM CHLORIDE 0.9 % IV SOLN
600.0000 mg/m2 | Freq: Once | INTRAVENOUS | Status: AC
  Administered 2012-06-24: 1120 mg via INTRAVENOUS
  Filled 2012-06-24: qty 56

## 2012-06-24 MED ORDER — DEXAMETHASONE SODIUM PHOSPHATE 4 MG/ML IJ SOLN
20.0000 mg | Freq: Once | INTRAMUSCULAR | Status: AC
  Administered 2012-06-24: 20 mg via INTRAVENOUS

## 2012-06-24 MED ORDER — ONDANSETRON 16 MG/50ML IVPB (CHCC)
16.0000 mg | Freq: Once | INTRAVENOUS | Status: AC
  Administered 2012-06-24: 16 mg via INTRAVENOUS

## 2012-06-24 MED ORDER — HEPARIN SOD (PORK) LOCK FLUSH 100 UNIT/ML IV SOLN
500.0000 [IU] | Freq: Once | INTRAVENOUS | Status: AC | PRN
  Administered 2012-06-24: 500 [IU]
  Filled 2012-06-24: qty 5

## 2012-06-24 MED ORDER — SODIUM CHLORIDE 0.9 % IV SOLN
75.0000 mg/m2 | Freq: Once | INTRAVENOUS | Status: AC
  Administered 2012-06-24: 140 mg via INTRAVENOUS
  Filled 2012-06-24: qty 14

## 2012-06-24 NOTE — Patient Instructions (Addendum)
Proceed with chemotherapy today  Return on 3/26 for neulasta injection  Please take tylenol and zyrtec 1 hour before the injection and then continue daily for 3 days to help prevent aches and pains from the injection  Drink plenty of water to flush the chemotherapy out of your system  You can take lorazepam at night time to help you sleep  We will see you back in 1 week for office visit and blood counts

## 2012-06-24 NOTE — Patient Instructions (Addendum)
Dexamethasone injection What is this medicine? DEXAMETHASONE (dex a METH a sone) is a corticosteroid. It is used to treat inflammation of the skin, joints, lungs, and other organs. Common conditions treated include asthma, allergies, and arthritis. It is also used for other conditions, like blood disorders and diseases of the adrenal glands. This medicine may be used for other purposes; ask your health care provider or pharmacist if you have questions. What should I tell my health care provider before I take this medicine? They need to know if you have any of these conditions: -blood clotting problems -Cushing's syndrome -diabetes -glaucoma -heart problems or disease -high blood pressure -infection like herpes, measles, tuberculosis, or chickenpox -kidney disease -liver disease -mental problems -myasthenia gravis -osteoporosis -previous heart attack -seizures -stomach, ulcer or intestine disease including colitis and diverticulitis -thyroid problem -an unusual or allergic reaction to dexamethasone, corticosteroids, other medicines, lactose, foods, dyes, or preservatives -pregnant or trying to get pregnant -breast-feeding How should I use this medicine? This medicine is for injection into a muscle, joint, lesion, soft tissue, or vein. It is given by a health care professional in a hospital or clinic setting. Talk to your pediatrician regarding the use of this medicine in children. Special care may be needed. Overdosage: If you think you have taken too much of this medicine contact a poison control center or emergency room at once. NOTE: This medicine is only for you. Do not share this medicine with others. What if I miss a dose? This may not apply. If you are having a series of injections over a prolonged period, try not to miss an appointment. Call your doctor or health care professional to reschedule if you are unable to keep an appointment. What may interact with this medicine? Do  not take this medicine with any of the following medications: -mifepristone, RU-486 -vaccines This medicine may also interact with the following medications: -amphotericin B -antibiotics like clarithromycin, erythromycin, and troleandomycin -aspirin and aspirin-like drugs -barbiturates like phenobarbital -carbamazepine -cholestyramine -cholinesterase inhibitors like donepezil, galantamine, rivastigmine, and tacrine -cyclosporine -digoxin -diuretics -ephedrine -female hormones, like estrogens or progestins and birth control pills -indinavir -isoniazid -ketoconazole -medicines for diabetes -medicines that improve muscle tone or strength for conditions like myasthenia gravis -NSAIDs, medicines for pain and inflammation, like ibuprofen or naproxen -phenytoin -rifampin -thalidomide -warfarin This list may not describe all possible interactions. Give your health care provider a list of all the medicines, herbs, non-prescription drugs, or dietary supplements you use. Also tell them if you smoke, drink alcohol, or use illegal drugs. Some items may interact with your medicine. What should I watch for while using this medicine? Your condition will be monitored carefully while you are receiving this medicine. If you are taking this medicine for a long time, carry an identification card with your name and address, the type and dose of your medicine, and your doctor's name and address. This medicine may increase your risk of getting an infection. Stay away from people who are sick. Tell your doctor or health care professional if you are around anyone with measles or chickenpox. Talk to your health care provider before you get any vaccines that you take this medicine. If you are going to have surgery, tell your doctor or health care professional that you have taken this medicine within the last twelve months. Ask your doctor or health care professional about your diet. You may need to lower the  amount of salt you eat. The medicine can increase your blood sugar. If you  are a diabetic check with your doctor if you need help adjusting the dose of your diabetic medicine. What side effects may I notice from receiving this medicine? Side effects that you should report to your doctor or health care professional as soon as possible: -allergic reactions like skin rash, itching or hives, swelling of the face, lips, or tongue -black or tarry stools -change in the amount of urine -changes in vision -confusion, excitement, restlessness, a false sense of well-being -fever, sore throat, sneezing, cough, or other signs of infection, wounds that will not heal -hallucinations -increased thirst -mental depression, mood swings, mistaken feelings of self importance or of being mistreated -pain in hips, back, ribs, arms, shoulders, or legs -pain, redness, or irritation at the injection site -redness, blistering, peeling or loosening of the skin, including inside the mouth -rounding out of face -swelling of feet or lower legs -unusual bleeding or bruising -unusual tired or weak -wounds that do not heal Side effects that usually do not require medical attention (report to your doctor or health care professional if they continue or are bothersome): -diarrhea or constipation -change in taste -headache -nausea, vomiting -skin problems, acne, thin and shiny skin -touble sleeping -unusual growth of hair on the face or body -weight gain This list may not describe all possible side effects. Call your doctor for medical advice about side effects. You may report side effects to FDA at 1-800-FDA-1088. Where should I keep my medicine? This drug is given in a hospital or clinic and will not be stored at home. NOTE: This sheet is a summary. It may not cover all possible information. If you have questions about this medicine, talk to your doctor, pharmacist, or health care provider.  2012, [employer]  Standard. (07/10/2007 2:04:12 PM)    Ondansetron injection What is this medicine? ONDANSETRON (on DAN se tron) is used to treat nausea and vomiting caused by chemotherapy. It is also used to prevent or treat nausea and vomiting after surgery. This medicine may be used for other purposes; ask your health care provider or pharmacist if you have questions. What should I tell my health care provider before I take this medicine? They need to know if you have any of these conditions: -heart disease -history of irregular heartbeat -liver disease -low levels of magnesium or potassium in the blood -an unusual or allergic reaction to ondansetron, granisetron, other medicines, foods, dyes, or preservatives -pregnant or trying to get pregnant -breast-feeding How should I use this medicine? This medicine is for infusion into a vein. It is given by a health care professional in a hospital or clinic setting. Talk to your pediatrician regarding the use of this medicine in children. Special care may be needed. Overdosage: If you think you have taken too much of this medicine contact a poison control center or emergency room at once. NOTE: This medicine is only for you. Do not share this medicine with others. What if I miss a dose? This does not apply. What may interact with this medicine? Do not take this medicine with any of the following medications: -apomorphine  This medicine may also interact with the following medications: -carbamazepine -phenytoin -rifampicin -tramadol This list may not describe all possible interactions. Give your health care provider a list of all the medicines, herbs, non-prescription drugs, or dietary supplements you use. Also tell them if you smoke, drink alcohol, or use illegal drugs. Some items may interact with your medicine. What should I watch for while using this medicine? Your condition  will be monitored carefully while you are receiving this medicine. What side  effects may I notice from receiving this medicine? Side effects that you should report to your doctor or health care professional as soon as possible: -allergic reactions like skin rash, itching or hives, swelling of the face, lips, or tongue -breathing problems -dizziness -fast or irregular heartbeat -feeling faint or lightheaded, falls -fever and chills -swelling of the hands and feet -tightness in the chest Side effects that usually do not require medical attention (report to your doctor or health care professional if they continue or are bothersome): -constipation or diarrhea -headache This list may not describe all possible side effects. Call your doctor for medical advice about side effects. You may report side effects to FDA at 1-800-FDA-1088. Where should I keep my medicine? This drug is given in a hospital or clinic and will not be stored at home. NOTE: This sheet is a summary. It may not cover all possible information. If you have questions about this medicine, talk to your doctor, pharmacist, or health care provider.  2012, [employer] Standard. (12/21/2009 11:37:58 AM)  Docetaxel injection What is this medicine? DOCETAXEL (doe se TAX el) is a chemotherapy drug. It targets fast dividing cells, like cancer cells, and causes these cells to die. This medicine is used to treat many types of cancers like breast cancer, certain stomach cancers, head and neck cancer, lung cancer, and prostate cancer. This medicine may be used for other purposes; ask your health care provider or pharmacist if you have questions. What should I tell my health care provider before I take this medicine? They need to know if you have any of these conditions: -infection (especially a virus infection such as chickenpox, cold sores, or herpes) -liver disease -low blood counts, like low white cell, platelet, or red cell counts -an unusual or allergic reaction to docetaxel, polysorbate 80, other chemotherapy  agents, other medicines, foods, dyes, or preservatives -pregnant or trying to get pregnant -breast-feeding How should I use this medicine? This drug is given as an infusion into a vein. It is administered in a hospital or clinic by a specially trained health care professional. Talk to your pediatrician regarding the use of this medicine in children. Special care may be needed. Overdosage: If you think you have taken too much of this medicine contact a poison control center or emergency room at once. NOTE: This medicine is only for you. Do not share this medicine with others. What if I miss a dose? It is important not to miss your dose. Call your doctor or health care professional if you are unable to keep an appointment. What may interact with this medicine? -cyclosporine -erythromycin -ketoconazole -medicines to increase blood counts like filgrastim, pegfilgrastim, sargramostim -vaccines Talk to your doctor or health care professional before taking any of these medicines: -acetaminophen -aspirin -ibuprofen -ketoprofen -naproxen This list may not describe all possible interactions. Give your health care provider a list of all the medicines, herbs, non-prescription drugs, or dietary supplements you use. Also tell them if you smoke, drink alcohol, or use illegal drugs. Some items may interact with your medicine. What should I watch for while using this medicine? Your condition will be monitored carefully while you are receiving this medicine. You will need important blood work done while you are taking this medicine. This drug may make you feel generally unwell. This is not uncommon, as chemotherapy can affect healthy cells as well as cancer cells. Report any side effects. Continue your  course of treatment even though you feel ill unless your doctor tells you to stop. In some cases, you may be given additional medicines to help with side effects. Follow all directions for their use. Call  your doctor or health care professional for advice if you get a fever, chills or sore throat, or other symptoms of a cold or flu. Do not treat yourself. This drug decreases your body's ability to fight infections. Try to avoid being around people who are sick. This medicine may increase your risk to bruise or bleed. Call your doctor or health care professional if you notice any unusual bleeding. Be careful brushing and flossing your teeth or using a toothpick because you may get an infection or bleed more easily. If you have any dental work done, tell your dentist you are receiving this medicine. Avoid taking products that contain aspirin, acetaminophen, ibuprofen, naproxen, or ketoprofen unless instructed by your doctor. These medicines may hide a fever. Do not become pregnant while taking this medicine. Women should inform their doctor if they wish to become pregnant or think they might be pregnant. There is a potential for serious side effects to an unborn child. Talk to your health care professional or pharmacist for more information. Do not breast-feed an infant while taking this medicine. What side effects may I notice from receiving this medicine? Side effects that you should report to your doctor or health care professional as soon as possible: -allergic reactions like skin rash, itching or hives, swelling of the face, lips, or tongue -low blood counts - This drug may decrease the number of white blood cells, red blood cells and platelets. You may be at increased risk for infections and bleeding. -signs of infection - fever or chills, cough, sore throat, pain or difficulty passing urine -signs of decreased platelets or bleeding - bruising, pinpoint red spots on the skin, black, tarry stools, nosebleeds -signs of decreased red blood cells - unusually weak or tired, fainting spells, lightheadedness -breathing problems -fast or irregular heartbeat -low blood pressure -mouth sores -nausea and  vomiting -pain, swelling, redness or irritation at the injection site -pain, tingling, numbness in the hands or feet -swelling of the ankle, feet, hands -weight gain Side effects that usually do not require medical attention (report to your prescriber or health care professional if they continue or are bothersome): -bone pain -complete hair loss including hair on your head, underarms, pubic hair, eyebrows, and eyelashes -diarrhea -excessive tearing -changes in the color of fingernails -loosening of the fingernails -nausea -muscle pain -red flush to skin -sweating -weak or tired This list may not describe all possible side effects. Call your doctor for medical advice about side effects. You may report side effects to FDA at 1-800-FDA-1088. Where should I keep my medicine? This drug is given in a hospital or clinic and will not be stored at home. NOTE: This sheet is a summary. It may not cover all possible information. If you have questions about this medicine, talk to your doctor, pharmacist, or health care provider.  2013, [employer] Standard. (03/01/2008 11:52:10 AM)  Cyclophosphamide injection What is this medicine? CYCLOPHOSPHAMIDE (sye kloe FOSS fa mide) is a chemotherapy drug. It slows the growth of cancer cells. This medicine is used to treat many types of cancer like lymphoma, myeloma, leukemia, breast cancer, and ovarian cancer, to name a few. It is also used to treat nephrotic syndrome in children. This medicine may be used for other purposes; ask your health care provider or pharmacist  if you have questions. What should I tell my health care provider before I take this medicine? They need to know if you have any of these conditions: -blood disorders -history of other chemotherapy -history of radiation therapy -infection -kidney disease -liver disease -tumors in the bone marrow -an unusual or allergic reaction to cyclophosphamide, other chemotherapy, other medicines,  foods, dyes, or preservatives -pregnant or trying to get pregnant -breast-feeding How should I use this medicine? This drug is usually given as an injection into a vein or muscle or by infusion into a vein. It is administered in a hospital or clinic by a specially trained health care professional. Talk to your pediatrician regarding the use of this medicine in children. While this drug may be prescribed for selected conditions, precautions do apply. Overdosage: If you think you have taken too much of this medicine contact a poison control center or emergency room at once. NOTE: This medicine is only for you. Do not share this medicine with others. What if I miss a dose? It is important not to miss your dose. Call your doctor or health care professional if you are unable to keep an appointment. What may interact with this medicine? Do not take this medicine with any of the following medications: -mibefradil -nalidixic acid This medicine may also interact with the following medications: -doxorubicin -etanercept -medicines to increase blood counts like filgrastim, pegfilgrastim, sargramostim -medicines that block muscle or nerve pain -St. John's Wort -phenobarbital -succinylcholine chloride -trastuzumab -vaccines Talk to your doctor or health care professional before taking any of these medicines: -acetaminophen -aspirin -ibuprofen -ketoprofen -naproxen This list may not describe all possible interactions. Give your health care provider a list of all the medicines, herbs, non-prescription drugs, or dietary supplements you use. Also tell them if you smoke, drink alcohol, or use illegal drugs. Some items may interact with your medicine. What should I watch for while using this medicine? Visit your doctor for checks on your progress. This drug may make you feel generally unwell. This is not uncommon, as chemotherapy can affect healthy cells as well as cancer cells. Report any side effects.  Continue your course of treatment even though you feel ill unless your doctor tells you to stop. Drink water or other fluids as directed. Urinate often, even at night. In some cases, you may be given additional medicines to help with side effects. Follow all directions for their use. Call your doctor or health care professional for advice if you get a fever, chills or sore throat, or other symptoms of a cold or flu. Do not treat yourself. This drug decreases your body's ability to fight infections. Try to avoid being around people who are sick. This medicine may increase your risk to bruise or bleed. Call your doctor or health care professional if you notice any unusual bleeding. Be careful brushing and flossing your teeth or using a toothpick because you may get an infection or bleed more easily. If you have any dental work done, tell your dentist you are receiving this medicine. Avoid taking products that contain aspirin, acetaminophen, ibuprofen, naproxen, or ketoprofen unless instructed by your doctor. These medicines may hide a fever. Do not become pregnant while taking this medicine. Women should inform their doctor if they wish to become pregnant or think they might be pregnant. There is a potential for serious side effects to an unborn child. Talk to your health care professional or pharmacist for more information. Do not breast-feed an infant while taking this medicine.  Men should inform their doctor if they wish to father a child. This medicine may lower sperm counts. If you are going to have surgery, tell your doctor or health care professional that you have taken this medicine. What side effects may I notice from receiving this medicine? Side effects that you should report to your doctor or health care professional as soon as possible: -allergic reactions like skin rash, itching or hives, swelling of the face, lips, or tongue -low blood counts - this medicine may decrease the number of white  blood cells, red blood cells and platelets. You may be at increased risk for infections and bleeding. -signs of infection - fever or chills, cough, sore throat, pain or difficulty passing urine -signs of decreased platelets or bleeding - bruising, pinpoint red spots on the skin, black, tarry stools, blood in the urine -signs of decreased red blood cells - unusually weak or tired, fainting spells, lightheadedness -breathing problems -dark urine -mouth sores -pain, swelling, redness at site where injected -swelling of the ankles, feet, hands -trouble passing urine or change in the amount of urine -weight gain -yellowing of the eyes or skin Side effects that usually do not require medical attention (report to your doctor or health care professional if they continue or are bothersome): -changes in nail or skin color -diarrhea -hair loss -loss of appetite -missed menstrual periods -nausea, vomiting -stomach pain This list may not describe all possible side effects. Call your doctor for medical advice about side effects. You may report side effects to FDA at 1-800-FDA-1088. Where should I keep my medicine? This drug is given in a hospital or clinic and will not be stored at home. NOTE: This sheet is a summary. It may not cover all possible information. If you have questions about this medicine, talk to your doctor, pharmacist, or health care provider.  2012, [employer] Standard. (06/24/2007 2:32:25 PM)  [facility] Discharge Instructions for Patients Receiving Chemotherapy  Today you received the following chemotherapy agents Taxotere, Cytoxan  To help prevent nausea and vomiting after your treatment, we encourage you to take your nausea medication Zofran Begin taking it at 8:00pm and take it as often as prescribed for the next 48-72 hours.   If you develop nausea and vomiting that is not controlled by your nausea medication, call the clinic. If it is after clinic hours  your family physician or the after hours number for the clinic or go to the Emergency Department.   BELOW ARE SYMPTOMS THAT SHOULD BE REPORTED IMMEDIATELY:  *FEVER GREATER THAN 100.5 F  *CHILLS WITH OR WITHOUT FEVER  NAUSEA AND VOMITING THAT IS NOT CONTROLLED WITH YOUR NAUSEA MEDICATION  *UNUSUAL SHORTNESS OF BREATH  *UNUSUAL BRUISING OR BLEEDING  TENDERNESS IN MOUTH AND THROAT WITH OR WITHOUT PRESENCE OF ULCERS  *URINARY PROBLEMS  *BOWEL PROBLEMS  UNUSUAL RASH Items with * indicate a potential emergency and should be followed up as soon as possible.  One of the nurses will contact you 24 hours after your treatment. Please let the nurse know about any problems that you may have experienced. Feel free to call the clinic you have any questions or concerns. The clinic phone number is [phone].   I have been informed and understand all the instructions given to me. I know to contact the clinic, my physician, or go to the Emergency Department if any problems should occur. I do not have any questions at this time, but understand that I may call the clinic  during office hours   should I have any questions or need assistance in obtaining follow up care.    __________________________________________  _____________  __________ Signature of Patient or Authorized Representative            Date                   Time    __________________________________________ Nurse's Signature

## 2012-06-24 NOTE — Progress Notes (Signed)
OFFICE PROGRESS NOTE  CC  [name], DO [address] [place] [address]  DIAGNOSIS: 57 year old female with stage IIB right breast cancer, ER positive, PR positive, HER-2/neu negative   PRIOR THERAPY: :#1 .Who was seen in the multidisciplinary breast clinic for consultation. Patient noted a mass in the right breast about 4 months ago. She's had a mammogram every year on an annual basis. On her prior mammogram there were no suspicious findings. But because of the mass she presented for workup for the palpable mass. On mammogram there was noted to be an abnormality at the 11:00 position in the right breast corresponding to the palpable abnormality. There were also noted to be 2 additional suspicious masses within the right breast as well as abnormal right axillary lymph nodes. Patient went on to have an ultrasound-guided core needle biopsy of all 3 masses and the axillary lymph node. All 3 masses were positive for invasive ductal carcinoma. 2 primaries appear to be HER-2/neu negative ER positive PR +1 of the primaries demonstrated mucinous features with Ki-67 20%. The lymph node had a higher Ki-67 of 70%. Indicating there were 2 synchronous primaries. Patient underwent MRI of the breasts. Again 3 lesions were noted within the right breast. The larger mass was at the 10:00 position measuring 2.3 cm. An abnormal level I right axillary lymph node measures 3.0 cm. No abnormalities in the contralateral breast   #2 Patient is now status post right modified radical mastectomy. She was found to have multifocal disease. Tumor was ER PR positive HER-2/neu negative. She had one of 17 lymph nodes positive for metastatic disease. We discussed her pathology in detail today.   #3 patient is a good candidate for adjuvant chemotherapy. We discussed Taxotere Cytoxan every 3 weeks for a total of 6 cycles. Hopefully we can begin this towards the end of March. I discussed the side effects risks and  benefits of it. She will need to radiation therapy and she will be referred back to radiation oncology after she completes her chemotherapy.    CURRENT THERAPY: she will proceed with her adjuvant TC day 1 cycle 1  INTERVAL HISTORY: Tasha Ortiz 57 y.o. female returns for follow up visit to begin chemotherapy. Overall she is doing well, no complaints are presented today. We discussed details of her chemotherapy. She will require GCSF support. No fevers or chills no n/v/d. No headaches  MEDICAL HISTORY: Past Medical History  Diagnosis Date  . Hypertension   . Hypercholesteremia     taken off chol meds Dec 2012  . Acid reflux   . Breast cancer   . Heartburn     ALLERGIES:  has No Known Allergies.  MEDICATIONS:  Current Outpatient Prescriptions  Medication Sig Dispense Refill  . dexamethasone (DECADRON) 1 MG tablet Take 1 mg by mouth 2 (two) times daily with a meal.      . famotidine (PEPCID) 20 MG tablet Take 20 mg by mouth 2 (two) times daily.      Marland Kitchen guaiFENesin (MUCINEX) 600 MG 12 hr tablet Take 1,200 mg by mouth 2 (two) times daily as needed. For cough      . lidocaine-prilocaine (EMLA) cream Apply topically as needed.  30 g  6  . lisinopril-hydrochlorothiazide (PRINZIDE,ZESTORETIC) 20-25 MG per tablet Take 1 tablet by mouth daily.      . ondansetron (ZOFRAN) 8 MG tablet Take by mouth every 8 (eight) hours as needed for nausea.      Marland Kitchen oxyCODONE-acetaminophen (PERCOCET/ROXICET)  5-325 MG per tablet Take 1-2 tablets by mouth every 4 (four) hours as needed.  40 tablet  0  . pravastatin (PRAVACHOL) 40 MG tablet Take 40 mg by mouth daily.      Marland Kitchen Prochlorperazine Maleate (COMPAZINE PO) Take by mouth.      . sodium chloride (OCEAN) 0.65 % nasal spray Place 4 sprays into the nose daily as needed. For congestion.       No current facility-administered medications for this visit.    SURGICAL HISTORY:  Past Surgical History  Procedure Laterality Date  . Cesarean section  25 years ago   . Tubal ligation    . Mastectomy Right   . Modified mastectomy Right 05/20/2012    Procedure: MODIFIED MASTECTOMY;  Surgeon: Mariella Saa, MD;  Location: [facility] OR;  Service: General;  Laterality: Right;  . Portacath placement Left 05/20/2012    Procedure: INSERTION PORT-A-CATH;  Surgeon: Mariella Saa, MD;  Location: [facility] OR;  Service: General;  Laterality: Left;  . Breast surgery Right 05/20/12    Rt br mastectomy    REVIEW OF SYSTEMS:  A comprehensive review of systems was negative.   HEALTH MAINTENANCE:  PHYSICAL EXAMINATION: Blood pressure 136/82, pulse 69, temperature 98.5 F (36.9 C), temperature source Oral, resp. rate 20, height 5\' 3"  (1.6 m), weight 173 lb 8 oz (78.699 kg). Body mass index is 30.74 kg/(m^2). ECOG PERFORMANCE STATUS: 0 - Asymptomatic   General appearance: alert, cooperative and appears stated age Resp: clear to auscultation bilaterally Cardio: regular rate and rhythm GI: soft, non-tender; bowel sounds normal; no masses,  no organomegaly Extremities: extremities normal, atraumatic, no cyanosis or edema Neurologic: Grossly normal   LABORATORY DATA: Lab Results  Component Value Date   WBC 5.1 06/24/2012   HGB 12.5 06/24/2012   HCT 37.5 06/24/2012   MCV 85.4 06/24/2012   PLT 226 06/24/2012      Chemistry      Component Value Date/Time   NA 138 05/21/2012 0515   NA 137 04/23/2012 1201   K 4.2 05/21/2012 0515   K 3.5 04/23/2012 1201   CL 104 05/21/2012 0515   CL 101 04/23/2012 1201   CO2 25 05/21/2012 0515   CO2 26 04/23/2012 1201   BUN 10 05/21/2012 0515   BUN 11.0 04/23/2012 1201   CREATININE 0.89 05/21/2012 0515   CREATININE 1.0 04/23/2012 1201   CREATININE 0.85 03/10/2012 1544      Component Value Date/Time   CALCIUM 9.1 05/21/2012 0515   CALCIUM 10.1 04/23/2012 1201   ALKPHOS 44 04/23/2012 1201   ALKPHOS 47 04/06/2012 2306   AST 12 04/23/2012 1201   AST 21 04/06/2012 2306   ALT 11 04/23/2012 1201   ALT 16 04/06/2012 2306   BILITOT 0.32 04/23/2012 1201    BILITOT 0.2* 04/06/2012 2306       RADIOGRAPHIC STUDIES:  No results found.  ASSESSMENT: 57 year old female with   #1 stage II a multifocal invasive ductal carcinoma of the right breast status post mastectomy with axillary lymph node dissection. Postoperatively she is doing well.   #2 patient will be treated with adjuvant chemotherapy consisting of Taxotere Cytoxan every 3 weeks for a total of 6 cycles. Risks and benefits of treatment were discussed with the patient in detail. She re: has a Port-A-Cath placed. I will get chemotherapy teaching class. Her orders were placed and the system today and all of her antiemetics percent to her pharmacy.  #3 She today will begin TC adjuvantly and  return on 3/26 for neulasta injection    PLAN:  1. Proceed with TC today  2. Return in 1 week for follow up and interim labs   All questions were answered. The patient knows to call the clinic with any problems, questions or concerns. We can certainly see the patient much sooner if necessary.  I spent 25 minutes counseling the patient face to face. The total time spent in the appointment was 30 minutes.    Drue Second, MD Medical/Oncology [facility] [phone] (beeper) [phone] (Office)  06/24/2012, 9:43 AM

## 2012-06-25 ENCOUNTER — Telehealth: Payer: Self-pay | Admitting: *Deleted

## 2012-06-25 ENCOUNTER — Ambulatory Visit: Payer: Medicaid Other

## 2012-06-25 ENCOUNTER — Ambulatory Visit (HOSPITAL_BASED_OUTPATIENT_CLINIC_OR_DEPARTMENT_OTHER): Payer: Medicaid Other

## 2012-06-25 VITALS — BP 149/86 | HR 88 | Temp 97.7°F

## 2012-06-25 DIAGNOSIS — C50419 Malignant neoplasm of upper-outer quadrant of unspecified female breast: Secondary | ICD-10-CM

## 2012-06-25 DIAGNOSIS — C50411 Malignant neoplasm of upper-outer quadrant of right female breast: Secondary | ICD-10-CM

## 2012-06-25 DIAGNOSIS — Z5189 Encounter for other specified aftercare: Secondary | ICD-10-CM

## 2012-06-25 MED ORDER — PEGFILGRASTIM INJECTION 6 MG/0.6ML
6.0000 mg | Freq: Once | SUBCUTANEOUS | Status: AC
  Administered 2012-06-25: 6 mg via SUBCUTANEOUS
  Filled 2012-06-25: qty 0.6

## 2012-06-25 NOTE — Telephone Encounter (Signed)
Patient here for Neulasta injection following 1st TC chemo treatment.  States that she is doing good.  No nausea, vomiting, or diarrhea.  Eating and drinking well   All questions answered.  Knows to call the office if she has any problems, questions, or concerns.

## 2012-06-25 NOTE — Patient Instructions (Addendum)
Take Claritin daily for 5-7 days and Ibuprofen as needed for pain.Pegfilgrastim injection What is this medicine? PEGFILGRASTIM (peg fil GRA stim) helps the body make more white blood cells. It is used to prevent infection in people with low amounts of white blood cells following cancer treatment. This medicine may be used for other purposes; ask your health care provider or pharmacist if you have questions. What should I tell my health care provider before I take this medicine? They need to know if you have any of these conditions: -sickle cell disease -an unusual or allergic reaction to pegfilgrastim, filgrastim, E.coli protein, other medicines, foods, dyes, or preservatives -pregnant or trying to get pregnant -breast-feeding How should I use this medicine? This medicine is for injection under the skin. It is usually given by a health care professional in a hospital or clinic setting. If you get this medicine at home, you will be taught how to prepare and give this medicine. Do not shake this medicine. Use exactly as directed. Take your medicine at regular intervals. Do not take your medicine more often than directed. It is important that you put your used needles and syringes in a special sharps container. Do not put them in a trash can. If you do not have a sharps container, call your pharmacist or healthcare provider to get one. Talk to your pediatrician regarding the use of this medicine in children. While this drug may be prescribed for children who weigh more than 45 kg for selected conditions, precautions do apply Overdosage: If you think you have taken too much of this medicine contact a poison control center or emergency room at once. NOTE: This medicine is only for you. Do not share this medicine with others. What if I miss a dose? If you miss a dose, take it as soon as you can. If it is almost time for your next dose, take only that dose. Do not take double or extra doses. What may  interact with this medicine? -lithium -medicines for growth therapy This list may not describe all possible interactions. Give your health care provider a list of all the medicines, herbs, non-prescription drugs, or dietary supplements you use. Also tell them if you smoke, drink alcohol, or use illegal drugs. Some items may interact with your medicine. What should I watch for while using this medicine? Visit your doctor for regular check ups. You will need important blood work done while you are taking this medicine. What side effects may I notice from receiving this medicine? Side effects that you should report to your doctor or health care professional as soon as possible: -allergic reactions like skin rash, itching or hives, swelling of the face, lips, or tongue -breathing problems -fever -pain, redness, or swelling where injected -shoulder pain -stomach or side pain Side effects that usually do not require medical attention (report to your doctor or health care professional if they continue or are bothersome): -aches, pains -headache -loss of appetite -nausea, vomiting -unusually tired This list may not describe all possible side effects. Call your doctor for medical advice about side effects. You may report side effects to FDA at 1-800-FDA-1088. Where should I keep my medicine? Keep out of the reach of children. Store in a refrigerator between 2 and 8 degrees C (36 and 46 degrees F). Do not freeze. Keep in carton to protect from light. Throw away this medicine if it is left out of the refrigerator for more than 48 hours. Throw away any unused medicine after  the expiration date. NOTE: This sheet is a summary. It may not cover all possible information. If you have questions about this medicine, talk to your doctor, pharmacist, or health care provider.  2013, [employer]. (10/20/2007 3:41:44 PM)

## 2012-06-26 ENCOUNTER — Encounter: Payer: Self-pay | Admitting: Oncology

## 2012-06-26 ENCOUNTER — Ambulatory Visit: Payer: Medicaid Other

## 2012-06-26 NOTE — Progress Notes (Signed)
Patient called in about EPP. I verified her address and will send application. She lives with her daughter and will have her do a letter of support.

## 2012-07-01 ENCOUNTER — Ambulatory Visit (HOSPITAL_BASED_OUTPATIENT_CLINIC_OR_DEPARTMENT_OTHER): Payer: Medicaid Other | Admitting: Adult Health

## 2012-07-01 ENCOUNTER — Other Ambulatory Visit (HOSPITAL_BASED_OUTPATIENT_CLINIC_OR_DEPARTMENT_OTHER): Payer: Medicaid Other | Admitting: Lab

## 2012-07-01 ENCOUNTER — Encounter: Payer: Self-pay | Admitting: Adult Health

## 2012-07-01 VITALS — BP 126/79 | HR 93 | Temp 98.5°F | Resp 20 | Ht 63.0 in | Wt 173.1 lb

## 2012-07-01 DIAGNOSIS — C50419 Malignant neoplasm of upper-outer quadrant of unspecified female breast: Secondary | ICD-10-CM

## 2012-07-01 DIAGNOSIS — C50411 Malignant neoplasm of upper-outer quadrant of right female breast: Secondary | ICD-10-CM

## 2012-07-01 DIAGNOSIS — E876 Hypokalemia: Secondary | ICD-10-CM

## 2012-07-01 DIAGNOSIS — Z170 Estrogen receptor positive status [ER+]: Secondary | ICD-10-CM

## 2012-07-01 LAB — CBC WITH DIFFERENTIAL/PLATELET
BASO%: 0.2 % (ref 0.0–2.0)
Basophils Absolute: 0.1 10*3/uL (ref 0.0–0.1)
EOS%: 0.2 % (ref 0.0–7.0)
Eosinophils Absolute: 0 10*3/uL (ref 0.0–0.5)
HCT: 36 % (ref 34.8–46.6)
HGB: 12.1 g/dL (ref 11.6–15.9)
LYMPH%: 13.3 % — ABNORMAL LOW (ref 14.0–49.7)
MCH: 28.1 pg (ref 25.1–34.0)
MCHC: 33.6 g/dL (ref 31.5–36.0)
MCV: 83.7 fL (ref 79.5–101.0)
MONO#: 2.1 10*3/uL — ABNORMAL HIGH (ref 0.1–0.9)
MONO%: 10.3 % (ref 0.0–14.0)
NEUT#: 15.5 10*3/uL — ABNORMAL HIGH (ref 1.5–6.5)
NEUT%: 76 % (ref 38.4–76.8)
Platelets: 178 10*3/uL (ref 145–400)
RBC: 4.3 10*6/uL (ref 3.70–5.45)
RDW: 13.5 % (ref 11.2–14.5)
WBC: 20.4 10*3/uL — ABNORMAL HIGH (ref 3.9–10.3)
lymph#: 2.7 10*3/uL (ref 0.9–3.3)
nRBC: 0 % (ref 0–0)

## 2012-07-01 LAB — COMPREHENSIVE METABOLIC PANEL (CC13)
ALT: 25 U/L (ref 0–55)
AST: 22 U/L (ref 5–34)
Albumin: 3.2 g/dL — ABNORMAL LOW (ref 3.5–5.0)
Alkaline Phosphatase: 87 U/L (ref 40–150)
BUN: 11.8 mg/dL (ref 7.0–26.0)
CO2: 26 mEq/L (ref 22–29)
Calcium: 9.5 mg/dL (ref 8.4–10.4)
Chloride: 102 mEq/L (ref 98–107)
Creatinine: 1.1 mg/dL (ref 0.6–1.1)
Glucose: 169 mg/dl — ABNORMAL HIGH (ref 70–99)
Potassium: 3 mEq/L — CL (ref 3.5–5.1)
Sodium: 138 mEq/L (ref 136–145)
Total Bilirubin: 0.22 mg/dL (ref 0.20–1.20)
Total Protein: 6.9 g/dL (ref 6.4–8.3)

## 2012-07-01 MED ORDER — POTASSIUM CHLORIDE CRYS ER 20 MEQ PO TBCR: 20.0000 meq | EXTENDED_RELEASE_TABLET | Freq: Every day | ORAL | Status: DC

## 2012-07-01 NOTE — Patient Instructions (Signed)

## 2012-07-01 NOTE — Progress Notes (Signed)
OFFICE PROGRESS NOTE  CCGildardo Cranker, DO [address] [place] [address]  DIAGNOSIS: 57 year old female with stage IIB right breast cancer, ER positive, PR positive, HER-2/neu negative.    PRIOR THERAPY:#1 .Who was seen in the multidisciplinary breast clinic for consultation. Patient noted a mass in the right breast about 4 months ago. She's had a mammogram every year on an annual basis. On her prior mammogram there were no suspicious findings. But because of the mass she presented for workup for the palpable mass. On mammogram there was noted to be an abnormality at the 11:00 position in the right breast corresponding to the palpable abnormality. There were also noted to be 2 additional suspicious masses within the right breast as well as abnormal right axillary lymph nodes. Patient went on to have an ultrasound-guided core needle biopsy of all 3 masses and the axillary lymph node. All 3 masses were positive for invasive ductal carcinoma. 2 primaries appear to be HER-2/neu negative ER positive PR +1 of the primaries demonstrated mucinous features with Ki-67 20%. The lymph node had a higher Ki-67 of 70%. Indicating there were 2 synchronous primaries. Patient underwent MRI of the breasts. Again 3 lesions were noted within the right breast. The larger mass was at the 10:00 position measuring 2.3 cm. An abnormal level I right axillary lymph node measures 3.0 cm. No abnormalities in the contralateral breast   #2 Patient is now status post right modified radical mastectomy. She was found to have multifocal disease. Tumor was ER PR positive HER-2/neu negative. She had one of 17 lymph nodes positive for metastatic disease. We discussed her pathology in detail today.   #3 patient is a good candidate for adjuvant chemotherapy. We discussed Taxotere Cytoxan every 3 weeks for a total of 6 cycles. Hopefully we can begin this towards the end of March. I discussed the side effects risks and  benefits of it. She will need to radiation therapy and she will be referred back to radiation oncology after she completes her chemotherapy.   CURRENT THERAPY: Taxotere Cytoxan cycle 1 day 8  INTERVAL HISTORY: Carnella Fryman 57 y.o. female returns for f/u after her  First cycle of Taxotere/Cytoxan.  She's doing well.  She has adequate fluid intake.  She denies fevers, chills, nausea, vomiting, constipation, numbness, or further concerns.  She is experiencing fatigue, and had some bone pain after the Neulasta injection that was relieved with Zyrtec and Ibuprofen.  Otherwise, a 10 point ROS is neg.   MEDICAL HISTORY: Past Medical History  Diagnosis Date  . Hypertension   . Hypercholesteremia     taken off chol meds Dec 2012  . Acid reflux   . Breast cancer   . Heartburn     ALLERGIES:  has No Known Allergies.  MEDICATIONS:  Current Outpatient Prescriptions  Medication Sig Dispense Refill  . dexamethasone (DECADRON) 1 MG tablet Take 1 mg by mouth 2 (two) times daily with a meal.      . famotidine (PEPCID) 20 MG tablet Take 20 mg by mouth 2 (two) times daily.      Marland Kitchen guaiFENesin (MUCINEX) 600 MG 12 hr tablet Take 1,200 mg by mouth 2 (two) times daily as needed. For cough      . lidocaine-prilocaine (EMLA) cream Apply topically as needed.  30 g  6  . lisinopril-hydrochlorothiazide (PRINZIDE,ZESTORETIC) 20-25 MG per tablet Take 1 tablet by mouth daily.      . ondansetron (ZOFRAN) 8 MG tablet  Take by mouth every 8 (eight) hours as needed for nausea.      Marland Kitchen oxyCODONE-acetaminophen (PERCOCET/ROXICET) 5-325 MG per tablet Take 1-2 tablets by mouth every 4 (four) hours as needed.  40 tablet  0  . pravastatin (PRAVACHOL) 40 MG tablet Take 40 mg by mouth daily.      Marland Kitchen Prochlorperazine Maleate (COMPAZINE PO) Take by mouth.      . sodium chloride (OCEAN) 0.65 % nasal spray Place 4 sprays into the nose daily as needed. For congestion.      . potassium chloride SA (K-DUR,KLOR-CON) 20 MEQ tablet Take  1 tablet (20 mEq total) by mouth daily.  10 tablet  0   No current facility-administered medications for this visit.    SURGICAL HISTORY:  Past Surgical History  Procedure Laterality Date  . Cesarean section  25 years ago  . Tubal ligation    . Mastectomy Right   . Modified mastectomy Right 05/20/2012    Procedure: MODIFIED MASTECTOMY;  Surgeon: Mariella Saa, MD;  Location: [facility] OR;  Service: General;  Laterality: Right;  . Portacath placement Left 05/20/2012    Procedure: INSERTION PORT-A-CATH;  Surgeon: Mariella Saa, MD;  Location: [facility] OR;  Service: General;  Laterality: Left;  . Breast surgery Right 05/20/12    Rt br mastectomy    REVIEW OF SYSTEMS:  General: fatigue (-), night sweats (-), fever (-), pain (-) Lymph: palpable nodes (-) HEENT: vision changes (-), mucositis (-), gum bleeding (-), epistaxis (-) Cardiovascular: chest pain (-), palpitations (-) Pulmonary: shortness of breath (-), dyspnea on exertion (-), cough (-), hemoptysis (-) GI:  Early satiety (-), melena (-), dysphagia (-), nausea/vomiting (-), diarrhea (-) GU: dysuria (-), hematuria (-), incontinence (-) Musculoskeletal: joint swelling (-), joint pain (-), back pain (-) Neuro: weakness (-), numbness (-), headache (-), confusion (-) Skin: Rash (-), lesions (-), dryness (-) Psych: depression (-), suicidal/homicidal ideation (-), feeling of hopelessness (-)   PHYSICAL EXAMINATION: Blood pressure 126/79, pulse 93, temperature 98.5 F (36.9 C), temperature source Oral, resp. rate 20, height 5\' 3"  (1.6 m), weight 173 lb 1.6 oz (78.518 kg). Body mass index is 30.67 kg/(m^2). General: Patient is a well appearing female in no acute distress HEENT: PERRLA, sclerae anicteric no conjunctival pallor, MMM Neck: supple, no palpable adenopathy Lungs: clear to auscultation bilaterally, no wheezes, rhonchi, or rales Cardiovascular: regular rate rhythm, S1, S2, no murmurs, rubs or gallops Abdomen: Soft, non-tender,  non-distended, normoactive bowel sounds, no HSM Extremities: warm and well perfused, no clubbing, cyanosis, or edema Skin: No rashes or lesions Neuro: Non-focal Breasts:  ECOG PERFORMANCE STATUS: 1 - Symptomatic but completely ambulatory      LABORATORY DATA: Lab Results  Component Value Date   WBC 20.4* 07/01/2012   HGB 12.1 07/01/2012   HCT 36.0 07/01/2012   MCV 83.7 07/01/2012   PLT 178 07/01/2012      Chemistry      Component Value Date/Time   NA 138 07/01/2012 1041   NA 138 05/21/2012 0515   K 3.0 Repeated and Verified* 07/01/2012 1041   K 4.2 05/21/2012 0515   CL 102 07/01/2012 1041   CL 104 05/21/2012 0515   CO2 26 07/01/2012 1041   CO2 25 05/21/2012 0515   BUN 11.8 07/01/2012 1041   BUN 10 05/21/2012 0515   CREATININE 1.1 07/01/2012 1041   CREATININE 0.89 05/21/2012 0515   CREATININE 0.85 03/10/2012 1544      Component Value Date/Time   CALCIUM 9.5 07/01/2012  1041   CALCIUM 9.1 05/21/2012 0515   ALKPHOS 87 07/01/2012 1041   ALKPHOS 47 04/06/2012 2306   AST 22 07/01/2012 1041   AST 21 04/06/2012 2306   ALT 25 07/01/2012 1041   ALT 16 04/06/2012 2306   BILITOT 0.22 07/01/2012 1041   BILITOT 0.2* 04/06/2012 2306       RADIOGRAPHIC STUDIES:  No results found.  ASSESSMENT: 57 year old female with  #1 stage II a multifocal invasive ductal carcinoma of the right breast status post mastectomy with axillary lymph node dissection. Postoperatively she is doing well.   #2 patient will be treated with adjuvant chemotherapy consisting of Taxotere Cytoxan every 3 weeks for a total of 6 cycles. Risks and benefits of treatment were discussed with the patient in detail. She re: has a Port-A-Cath placed. I will get chemotherapy teaching class. Her orders were placed and the system today and all of her antiemetics percent to her pharmacy.  #3 Hypokalemia  PLAN:   1. Doing well.  Labs look good.  Ms. Milliner tolerated chemotherapy well.    2. I prescribed potassium po daily for 10 days.  I also gave her  information about hypokalemia in her after visit summary.  3. She will return in 2 weeks for her next appt.    All questions were answered. The patient knows to call the clinic with any problems, questions or concerns. We can certainly see the patient much sooner if necessary.  I spent 25 minutes counseling the patient face to face. The total time spent in the appointment was 30 minutes.    Cherie Ouch Lyn Hollingshead, NP Medical Oncology [facility] Phone: [phone] 07/01/2012, 11:56 AM

## 2012-07-02 ENCOUNTER — Encounter: Payer: Self-pay | Admitting: Oncology

## 2012-07-02 NOTE — Progress Notes (Signed)
Had to send the EPP back to the patient to be completed. I held on to the Letter of support.

## 2012-07-03 ENCOUNTER — Telehealth: Payer: Self-pay | Admitting: Medical Oncology

## 2012-07-03 NOTE — Telephone Encounter (Signed)
Pt called to report that her tongue is red and she is having a bitter taste in her mouth, denies pain or sores in mouth, denies swollen tongue.   Pt chemotherapy 03/25 of Taxotere and cytoxan. Informed pt that is a s/e to the chemotherapy. Encouraged pt to drink plenty of fluids and to call office should her symptoms worsen.  Patient verbalized understanding. No further questions at this time.

## 2012-07-08 ENCOUNTER — Ambulatory Visit: Payer: Medicaid Other

## 2012-07-09 ENCOUNTER — Ambulatory Visit: Payer: Medicaid Other

## 2012-07-10 ENCOUNTER — Encounter: Payer: Self-pay | Admitting: Oncology

## 2012-07-10 NOTE — Progress Notes (Signed)
100% ind 07/10/12-01/13/13. Letter of support from daughter. I will send letter and green card. All documents are scanned in.

## 2012-07-14 ENCOUNTER — Other Ambulatory Visit (HOSPITAL_BASED_OUTPATIENT_CLINIC_OR_DEPARTMENT_OTHER): Payer: Medicaid Other | Admitting: Lab

## 2012-07-14 ENCOUNTER — Encounter: Payer: Self-pay | Admitting: Adult Health

## 2012-07-14 ENCOUNTER — Encounter: Payer: Self-pay | Admitting: Oncology

## 2012-07-14 ENCOUNTER — Ambulatory Visit (HOSPITAL_BASED_OUTPATIENT_CLINIC_OR_DEPARTMENT_OTHER): Payer: No Typology Code available for payment source | Admitting: Adult Health

## 2012-07-14 VITALS — BP 125/80 | HR 74 | Temp 98.4°F | Resp 20 | Ht 63.0 in | Wt 176.8 lb

## 2012-07-14 DIAGNOSIS — C50411 Malignant neoplasm of upper-outer quadrant of right female breast: Secondary | ICD-10-CM

## 2012-07-14 DIAGNOSIS — C50419 Malignant neoplasm of upper-outer quadrant of unspecified female breast: Secondary | ICD-10-CM

## 2012-07-14 DIAGNOSIS — C773 Secondary and unspecified malignant neoplasm of axilla and upper limb lymph nodes: Secondary | ICD-10-CM

## 2012-07-14 DIAGNOSIS — E876 Hypokalemia: Secondary | ICD-10-CM

## 2012-07-14 LAB — CBC WITH DIFFERENTIAL/PLATELET
BASO%: 0.6 % (ref 0.0–2.0)
Basophils Absolute: 0 10*3/uL (ref 0.0–0.1)
EOS%: 0.8 % (ref 0.0–7.0)
Eosinophils Absolute: 0 10*3/uL (ref 0.0–0.5)
HCT: 34.9 % (ref 34.8–46.6)
HGB: 11.7 g/dL (ref 11.6–15.9)
LYMPH%: 35.5 % (ref 14.0–49.7)
MCH: 28.6 pg (ref 25.1–34.0)
MCHC: 33.4 g/dL (ref 31.5–36.0)
MCV: 85.6 fL (ref 79.5–101.0)
MONO#: 0.4 10*3/uL (ref 0.1–0.9)
MONO%: 9.6 % (ref 0.0–14.0)
NEUT#: 2.5 10*3/uL (ref 1.5–6.5)
NEUT%: 53.5 % (ref 38.4–76.8)
Platelets: 320 10*3/uL (ref 145–400)
RBC: 4.07 10*6/uL (ref 3.70–5.45)
RDW: 14.5 % (ref 11.2–14.5)
WBC: 4.7 10*3/uL (ref 3.9–10.3)
lymph#: 1.7 10*3/uL (ref 0.9–3.3)

## 2012-07-14 LAB — COMPREHENSIVE METABOLIC PANEL (CC13)
ALT: 14 U/L (ref 0–55)
AST: 14 U/L (ref 5–34)
Albumin: 3.5 g/dL (ref 3.5–5.0)
Alkaline Phosphatase: 61 U/L (ref 40–150)
BUN: 10.6 mg/dL (ref 7.0–26.0)
CO2: 26 mEq/L (ref 22–29)
Calcium: 9.4 mg/dL (ref 8.4–10.4)
Chloride: 103 mEq/L (ref 98–107)
Creatinine: 0.9 mg/dL (ref 0.6–1.1)
Glucose: 152 mg/dl — ABNORMAL HIGH (ref 70–99)
Potassium: 3.4 mEq/L — ABNORMAL LOW (ref 3.5–5.1)
Sodium: 138 mEq/L (ref 136–145)
Total Bilirubin: 0.33 mg/dL (ref 0.20–1.20)
Total Protein: 7 g/dL (ref 6.4–8.3)

## 2012-07-14 NOTE — Patient Instructions (Signed)
Doing well.  Labs look good. Proceed with chemotherapy.  Please call us if you have any questions or concerns.    

## 2012-07-14 NOTE — Progress Notes (Signed)
Patient signed [employer] and [facility] funds 600.00 and 400.00. She took W9 for rental company for [place] to pay her rent. We will pay May's so I advised her I need it back now to get it in before 5/1

## 2012-07-14 NOTE — Progress Notes (Signed)
OFFICE PROGRESS NOTE  CCGildardo Cranker, DO [address] [place] [address]  DIAGNOSIS: 57 year old female with stage IIB right breast cancer, ER positive, PR positive, HER-2/neu negative.    PRIOR THERAPY:#1 .Who was seen in the multidisciplinary breast clinic for consultation. Patient noted a mass in the right breast about 4 months ago. She's had a mammogram every year on an annual basis. On her prior mammogram there were no suspicious findings. But because of the mass she presented for workup for the palpable mass. On mammogram there was noted to be an abnormality at the 11:00 position in the right breast corresponding to the palpable abnormality. There were also noted to be 2 additional suspicious masses within the right breast as well as abnormal right axillary lymph nodes. Patient went on to have an ultrasound-guided core needle biopsy of all 3 masses and the axillary lymph node. All 3 masses were positive for invasive ductal carcinoma. 2 primaries appear to be HER-2/neu negative ER positive PR +1 of the primaries demonstrated mucinous features with Ki-67 20%. The lymph node had a higher Ki-67 of 70%. Indicating there were 2 synchronous primaries. Patient underwent MRI of the breasts. Again 3 lesions were noted within the right breast. The larger mass was at the 10:00 position measuring 2.3 cm. An abnormal level I right axillary lymph node measures 3.0 cm. No abnormalities in the contralateral breast   #2 Patient is now status post right modified radical mastectomy. She was found to have multifocal disease. Tumor was ER PR positive HER-2/neu negative. She had one of 17 lymph nodes positive for metastatic disease. We discussed her pathology in detail today.   #3 patient is a good candidate for adjuvant chemotherapy. We discussed Taxotere Cytoxan every 3 weeks for a total of 6 cycles. Hopefully we can begin this towards the end of March. I discussed the side effects risks and  benefits of it. She will need to radiation therapy and she will be referred back to radiation oncology after she completes her chemotherapy.   CURRENT THERAPY: Taxotere Cytoxan cycle 2 day 1  INTERVAL HISTORY: Tasha Ortiz 57 y.o. female returns for f/u prior to her second cycle of Taxotere/Cytoxan.  She tolerated the first cycle very well, with no nausea, vomiting, numbness, skin changes, and minimal bone pain she was able to alleviate with Ibuprofen.  She is feeling well today and a 10 point ROS is neg.   MEDICAL HISTORY: Past Medical History  Diagnosis Date  . Hypertension   . Hypercholesteremia     taken off chol meds Dec 2012  . Acid reflux   . Breast cancer   . Heartburn     ALLERGIES:  has No Known Allergies.  MEDICATIONS:  Current Outpatient Prescriptions  Medication Sig Dispense Refill  . dexamethasone (DECADRON) 1 MG tablet Take 1 mg by mouth 2 (two) times daily with a meal.      . famotidine (PEPCID) 20 MG tablet Take 20 mg by mouth 2 (two) times daily.      Marland Kitchen guaiFENesin (MUCINEX) 600 MG 12 hr tablet Take 1,200 mg by mouth 2 (two) times daily as needed. For cough      . lidocaine-prilocaine (EMLA) cream Apply topically as needed.  30 g  6  . lisinopril-hydrochlorothiazide (PRINZIDE,ZESTORETIC) 20-25 MG per tablet Take 1 tablet by mouth daily.      . ondansetron (ZOFRAN) 8 MG tablet Take by mouth every 8 (eight) hours as needed for nausea.      Marland Kitchen  oxyCODONE-acetaminophen (PERCOCET/ROXICET) 5-325 MG per tablet Take 1-2 tablets by mouth every 4 (four) hours as needed.  40 tablet  0  . pravastatin (PRAVACHOL) 40 MG tablet Take 40 mg by mouth daily.      Marland Kitchen Prochlorperazine Maleate (COMPAZINE PO) Take by mouth.      . sodium chloride (OCEAN) 0.65 % nasal spray Place 4 sprays into the nose daily as needed. For congestion.       No current facility-administered medications for this visit.    SURGICAL HISTORY:  Past Surgical History  Procedure Laterality Date  .  Cesarean section  25 years ago  . Tubal ligation    . Mastectomy Right   . Modified mastectomy Right 05/20/2012    Procedure: MODIFIED MASTECTOMY;  Surgeon: Mariella Saa, MD;  Location: [facility] OR;  Service: General;  Laterality: Right;  . Portacath placement Left 05/20/2012    Procedure: INSERTION PORT-A-CATH;  Surgeon: Mariella Saa, MD;  Location: [facility] OR;  Service: General;  Laterality: Left;  . Breast surgery Right 05/20/12    Rt br mastectomy    REVIEW OF SYSTEMS:  General: fatigue (-), night sweats (-), fever (-), pain (-) Lymph: palpable nodes (-) HEENT: vision changes (-), mucositis (-), gum bleeding (-), epistaxis (-) Cardiovascular: chest pain (-), palpitations (-) Pulmonary: shortness of breath (-), dyspnea on exertion (-), cough (-), hemoptysis (-) GI:  Early satiety (-), melena (-), dysphagia (-), nausea/vomiting (-), diarrhea (-) GU: dysuria (-), hematuria (-), incontinence (-) Musculoskeletal: joint swelling (-), joint pain (-), back pain (-) Neuro: weakness (-), numbness (-), headache (-), confusion (-) Skin: Rash (-), lesions (-), dryness (-) Psych: depression (-), suicidal/homicidal ideation (-), feeling of hopelessness (-)   PHYSICAL EXAMINATION: Blood pressure 125/80, pulse 74, temperature 98.4 F (36.9 C), temperature source Oral, resp. rate 20, height 5\' 3"  (1.6 m), weight 176 lb 12.8 oz (80.196 kg). Body mass index is 31.33 kg/(m^2). General: Patient is a well appearing female in no acute distress HEENT: PERRLA, sclerae anicteric no conjunctival pallor, MMM Neck: supple, no palpable adenopathy Lungs: clear to auscultation bilaterally, no wheezes, rhonchi, or rales Cardiovascular: regular rate rhythm, S1, S2, no murmurs, rubs or gallops Abdomen: Soft, non-tender, non-distended, normoactive bowel sounds, no HSM Extremities: warm and well perfused, no clubbing, cyanosis, or edema Skin: No rashes or lesions Neuro: Non-focal ECOG PERFORMANCE STATUS: 1 -  Symptomatic but completely ambulatory      LABORATORY DATA: Lab Results  Component Value Date   WBC 4.7 07/14/2012   HGB 11.7 07/14/2012   HCT 34.9 07/14/2012   MCV 85.6 07/14/2012   PLT 320 07/14/2012      Chemistry      Component Value Date/Time   NA 138 07/14/2012 1406   NA 138 05/21/2012 0515   K 3.4* 07/14/2012 1406   K 4.2 05/21/2012 0515   CL 103 07/14/2012 1406   CL 104 05/21/2012 0515   CO2 26 07/14/2012 1406   CO2 25 05/21/2012 0515   BUN 10.6 07/14/2012 1406   BUN 10 05/21/2012 0515   CREATININE 0.9 07/14/2012 1406   CREATININE 0.89 05/21/2012 0515   CREATININE 0.85 03/10/2012 1544      Component Value Date/Time   CALCIUM 9.4 07/14/2012 1406   CALCIUM 9.1 05/21/2012 0515   ALKPHOS 61 07/14/2012 1406   ALKPHOS 47 04/06/2012 2306   AST 14 07/14/2012 1406   AST 21 04/06/2012 2306   ALT 14 07/14/2012 1406   ALT 16 04/06/2012 2306  BILITOT 0.33 07/14/2012 1406   BILITOT 0.2* 04/06/2012 2306       RADIOGRAPHIC STUDIES:  No results found.  ASSESSMENT: 57 year old female with  #1 stage II a multifocal invasive ductal carcinoma of the right breast status post mastectomy with axillary lymph node dissection. Postoperatively she is doing well.   #2 patient will be treated with adjuvant chemotherapy consisting of Taxotere Cytoxan every 3 weeks for a total of 6 cycles. Risks and benefits of treatment were discussed with the patient in detail. She re: has a Port-A-Cath placed. I will get chemotherapy teaching class. Her orders were placed and the system today and all of her antiemetics percent to her pharmacy.  #3 Hypokalemia  PLAN:   1. Doing well.  Labs look good. She will proceed with chemotherapy tomorrow.    2. We will f/u on Ms. Brooker's potassium and prescribe Kdur if needed.    3. She will return next week for lab and an evaluation for chemotoxicities.     All questions were answered. The patient knows to call the clinic with any problems, questions or concerns. We can  certainly see the patient much sooner if necessary.  I spent 25 minutes counseling the patient face to face. The total time spent in the appointment was 30 minutes.    Cherie Ouch Lyn Hollingshead, NP Medical Oncology [facility] Phone: [phone] 07/14/2012, 4:17 PM

## 2012-07-15 ENCOUNTER — Ambulatory Visit (HOSPITAL_BASED_OUTPATIENT_CLINIC_OR_DEPARTMENT_OTHER): Payer: No Typology Code available for payment source

## 2012-07-15 VITALS — BP 125/79 | HR 78 | Temp 97.7°F

## 2012-07-15 DIAGNOSIS — C50419 Malignant neoplasm of upper-outer quadrant of unspecified female breast: Secondary | ICD-10-CM

## 2012-07-15 DIAGNOSIS — Z5111 Encounter for antineoplastic chemotherapy: Secondary | ICD-10-CM

## 2012-07-15 DIAGNOSIS — C50411 Malignant neoplasm of upper-outer quadrant of right female breast: Secondary | ICD-10-CM

## 2012-07-15 MED ORDER — DOCETAXEL CHEMO INJECTION 160 MG/16ML
75.0000 mg/m2 | Freq: Once | INTRAVENOUS | Status: AC
  Administered 2012-07-15: 140 mg via INTRAVENOUS
  Filled 2012-07-15: qty 14

## 2012-07-15 MED ORDER — SODIUM CHLORIDE 0.9 % IJ SOLN
10.0000 mL | INTRAMUSCULAR | Status: DC | PRN
  Administered 2012-07-15: 10 mL
  Filled 2012-07-15: qty 10

## 2012-07-15 MED ORDER — SODIUM CHLORIDE 0.9 % IV SOLN
Freq: Once | INTRAVENOUS | Status: AC
  Administered 2012-07-15: 09:00:00 via INTRAVENOUS

## 2012-07-15 MED ORDER — DEXAMETHASONE SODIUM PHOSPHATE 4 MG/ML IJ SOLN
20.0000 mg | Freq: Once | INTRAMUSCULAR | Status: AC
  Administered 2012-07-15: 20 mg via INTRAVENOUS

## 2012-07-15 MED ORDER — SODIUM CHLORIDE 0.9 % IV SOLN
600.0000 mg/m2 | Freq: Once | INTRAVENOUS | Status: AC
  Administered 2012-07-15: 1120 mg via INTRAVENOUS
  Filled 2012-07-15: qty 56

## 2012-07-15 MED ORDER — ONDANSETRON 16 MG/50ML IVPB (CHCC)
16.0000 mg | Freq: Once | INTRAVENOUS | Status: AC
  Administered 2012-07-15: 16 mg via INTRAVENOUS

## 2012-07-15 MED ORDER — HEPARIN SOD (PORK) LOCK FLUSH 100 UNIT/ML IV SOLN
500.0000 [IU] | Freq: Once | INTRAVENOUS | Status: AC | PRN
  Administered 2012-07-15: 500 [IU]
  Filled 2012-07-15: qty 5

## 2012-07-15 NOTE — Patient Instructions (Addendum)
[facility] Discharge Instructions for Patients Receiving Chemotherapy  Today you received the following chemotherapy agents cytoxan, taxotere To help prevent nausea and vomiting after your treatment, we encourage you to take your nausea medication as needed   If you develop nausea and vomiting that is not controlled by your nausea medication, call the clinic. If it is after clinic hours your family physician or the after hours number for the clinic or go to the Emergency Department.   BELOW ARE SYMPTOMS THAT SHOULD BE REPORTED IMMEDIATELY:  *FEVER GREATER THAN 100.5 F  *CHILLS WITH OR WITHOUT FEVER  NAUSEA AND VOMITING THAT IS NOT CONTROLLED WITH YOUR NAUSEA MEDICATION  *UNUSUAL SHORTNESS OF BREATH  *UNUSUAL BRUISING OR BLEEDING  TENDERNESS IN MOUTH AND THROAT WITH OR WITHOUT PRESENCE OF ULCERS  *URINARY PROBLEMS  *BOWEL PROBLEMS  UNUSUAL RASH Items with * indicate a potential emergency and should be followed up as soon as possible.  The clinic phone number is [phone].   I have been informed and understand all the instructions given to me. I know to contact the clinic, my physician, or go to the Emergency Department if any problems should occur. I do not have any questions at this time, but understand that I may call the clinic during office hours   should I have any questions or need assistance in obtaining follow up care.    __________________________________________  _____________  __________ Signature of Patient or Authorized Representative            Date                   Time    __________________________________________ Nurse's Signature

## 2012-07-16 ENCOUNTER — Ambulatory Visit (HOSPITAL_BASED_OUTPATIENT_CLINIC_OR_DEPARTMENT_OTHER): Payer: No Typology Code available for payment source

## 2012-07-16 VITALS — BP 129/74 | HR 96 | Temp 98.0°F

## 2012-07-16 DIAGNOSIS — C50411 Malignant neoplasm of upper-outer quadrant of right female breast: Secondary | ICD-10-CM

## 2012-07-16 DIAGNOSIS — C50419 Malignant neoplasm of upper-outer quadrant of unspecified female breast: Secondary | ICD-10-CM

## 2012-07-16 MED ORDER — PEGFILGRASTIM INJECTION 6 MG/0.6ML
6.0000 mg | Freq: Once | SUBCUTANEOUS | Status: AC
  Administered 2012-07-16: 6 mg via SUBCUTANEOUS
  Filled 2012-07-16: qty 0.6

## 2012-07-16 NOTE — Telephone Encounter (Signed)
Erroneous

## 2012-07-18 ENCOUNTER — Telehealth: Payer: Self-pay | Admitting: Medical Oncology

## 2012-07-18 ENCOUNTER — Other Ambulatory Visit: Payer: Self-pay | Admitting: Medical Oncology

## 2012-07-18 DIAGNOSIS — C50411 Malignant neoplasm of upper-outer quadrant of right female breast: Secondary | ICD-10-CM

## 2012-07-18 MED ORDER — POTASSIUM CHLORIDE CRYS ER 20 MEQ PO TBCR: 20.0000 meq | EXTENDED_RELEASE_TABLET | Freq: Every day | ORAL | Status: DC

## 2012-07-18 NOTE — Telephone Encounter (Signed)
Patient called with request of refill for potassium and to be sent to [facility], Per Augustin Schooling, NP pt to take by mouth potassium 20 meq daily. Patient sched for labs/NP appt 04/22. Pt verbalized understanding. Patient also stated her tongue feeling numb, denies open sores or pain. Encouraged pt to rinse mouth well after eating and encouraged fluids, pt to call office should symptoms become worse. Patient with no further questions, knows to call with questions/concerns.  Prescription for potassium 20 meq sent to [facility] outpt pharm.

## 2012-07-22 ENCOUNTER — Other Ambulatory Visit (HOSPITAL_BASED_OUTPATIENT_CLINIC_OR_DEPARTMENT_OTHER): Payer: Medicaid Other | Admitting: Lab

## 2012-07-22 ENCOUNTER — Ambulatory Visit (HOSPITAL_BASED_OUTPATIENT_CLINIC_OR_DEPARTMENT_OTHER): Payer: No Typology Code available for payment source | Admitting: Adult Health

## 2012-07-22 ENCOUNTER — Ambulatory Visit: Payer: Medicaid Other

## 2012-07-22 ENCOUNTER — Encounter: Payer: Self-pay | Admitting: Adult Health

## 2012-07-22 DIAGNOSIS — C50419 Malignant neoplasm of upper-outer quadrant of unspecified female breast: Secondary | ICD-10-CM

## 2012-07-22 DIAGNOSIS — C50411 Malignant neoplasm of upper-outer quadrant of right female breast: Secondary | ICD-10-CM

## 2012-07-22 DIAGNOSIS — E876 Hypokalemia: Secondary | ICD-10-CM

## 2012-07-22 DIAGNOSIS — C773 Secondary and unspecified malignant neoplasm of axilla and upper limb lymph nodes: Secondary | ICD-10-CM

## 2012-07-22 DIAGNOSIS — R209 Unspecified disturbances of skin sensation: Secondary | ICD-10-CM

## 2012-07-22 LAB — CBC WITH DIFFERENTIAL/PLATELET
BASO%: 0.8 % (ref 0.0–2.0)
Basophils Absolute: 0.3 10*3/uL — ABNORMAL HIGH (ref 0.0–0.1)
EOS%: 0.2 % (ref 0.0–7.0)
Eosinophils Absolute: 0 10*3/uL (ref 0.0–0.5)
HCT: 33.8 % — ABNORMAL LOW (ref 34.8–46.6)
HGB: 10.6 g/dL — ABNORMAL LOW (ref 11.6–15.9)
LYMPH%: 8.8 % — ABNORMAL LOW (ref 14.0–49.7)
MCH: 27 pg (ref 25.1–34.0)
MCHC: 31.5 g/dL (ref 31.5–36.0)
MCV: 85.6 fL (ref 79.5–101.0)
MONO#: 2 10*3/uL — ABNORMAL HIGH (ref 0.1–0.9)
MONO%: 6.4 % (ref 0.0–14.0)
NEUT#: 25.4 10*3/uL — ABNORMAL HIGH (ref 1.5–6.5)
NEUT%: 83.8 % — ABNORMAL HIGH (ref 38.4–76.8)
Platelets: 258 10*3/uL (ref 145–400)
RBC: 3.94 10*6/uL (ref 3.70–5.45)
RDW: 15.3 % — ABNORMAL HIGH (ref 11.2–14.5)
WBC: 30.4 10*3/uL — ABNORMAL HIGH (ref 3.9–10.3)
lymph#: 2.7 10*3/uL (ref 0.9–3.3)

## 2012-07-22 LAB — COMPREHENSIVE METABOLIC PANEL (CC13)
ALT: 30 U/L (ref 0–55)
AST: 23 U/L (ref 5–34)
Albumin: 3.3 g/dL — ABNORMAL LOW (ref 3.5–5.0)
Alkaline Phosphatase: 125 U/L (ref 40–150)
BUN: 11.6 mg/dL (ref 7.0–26.0)
CO2: 23 mEq/L (ref 22–29)
Calcium: 9.4 mg/dL (ref 8.4–10.4)
Chloride: 103 mEq/L (ref 98–107)
Creatinine: 1 mg/dL (ref 0.6–1.1)
Glucose: 197 mg/dl — ABNORMAL HIGH (ref 70–99)
Potassium: 3.8 mEq/L (ref 3.5–5.1)
Sodium: 138 mEq/L (ref 136–145)
Total Bilirubin: <0.2 mg/dL (ref 0.20–1.20)
Total Protein: 6.7 g/dL (ref 6.4–8.3)

## 2012-07-22 NOTE — Progress Notes (Signed)
OFFICE PROGRESS NOTE  CCGildardo Cranker, DO [address] [place] [address]  DIAGNOSIS: 57 year old female with stage IIB right breast cancer, ER positive, PR positive, HER-2/neu negative.    PRIOR THERAPY:#1 .Who was seen in the multidisciplinary breast clinic for consultation. Patient noted a mass in the right breast about 4 months ago. She's had a mammogram every year on an annual basis. On her prior mammogram there were no suspicious findings. But because of the mass she presented for workup for the palpable mass. On mammogram there was noted to be an abnormality at the 11:00 position in the right breast corresponding to the palpable abnormality. There were also noted to be 2 additional suspicious masses within the right breast as well as abnormal right axillary lymph nodes. Patient went on to have an ultrasound-guided core needle biopsy of all 3 masses and the axillary lymph node. All 3 masses were positive for invasive ductal carcinoma. 2 primaries appear to be HER-2/neu negative ER positive PR +1 of the primaries demonstrated mucinous features with Ki-67 20%. The lymph node had a higher Ki-67 of 70%. Indicating there were 2 synchronous primaries. Patient underwent MRI of the breasts. Again 3 lesions were noted within the right breast. The larger mass was at the 10:00 position measuring 2.3 cm. An abnormal level I right axillary lymph node measures 3.0 cm. No abnormalities in the contralateral breast   #2 Patient is now status post right modified radical mastectomy. She was found to have multifocal disease. Tumor was ER PR positive HER-2/neu negative. She had one of 17 lymph nodes positive for metastatic disease. We discussed her pathology in detail today.   #3 patient is a good candidate for adjuvant chemotherapy. We discussed Taxotere Cytoxan every 3 weeks for a total of 6 cycles. Hopefully we can begin this towards the end of March. I discussed the side effects risks and  benefits of it. She will need to radiation therapy and she will be referred back to radiation oncology after she completes her chemotherapy.   CURRENT THERAPY: Taxotere Cytoxan cycle 2 day 8  INTERVAL HISTORY: Tasha Ortiz 57 y.o. female returns for f/u after her second cycle of Taxotere/Cytoxan.  She tolerated the second treatment relatively well.  She endorses taste changes, but has retained a good appetite and fluid intake.  She did have a sore in her mouth that has now resolved, she has used biotene lozenges to help.  She was slightly more fatigued with this cycle, and has developed numbness in her fingertips.  She has retained her motor function.  She does not have numbness in her toes of feet.  Her legs were slightly achy after the Neulasta that was relieved with Tylenol.  She also had nausea relieved with her anti-emetics.  Otherwise, she denies fevers, chills, constipation, diarrhea.  She has had an increase in the number of bowel movements per day that she has had, from one every other day to two per day.  The stool is looser than usual.  Otherwise a 10 point ROS is neg.   MEDICAL HISTORY: Past Medical History  Diagnosis Date  . Hypertension   . Hypercholesteremia     taken off chol meds Dec 2012  . Acid reflux   . Breast cancer   . Heartburn     ALLERGIES:  has No Known Allergies.  MEDICATIONS:  Current Outpatient Prescriptions  Medication Sig Dispense Refill  . dexamethasone (DECADRON) 1 MG tablet Take 1 mg by  mouth 2 (two) times daily with a meal.      . famotidine (PEPCID) 20 MG tablet Take 20 mg by mouth 2 (two) times daily.      Marland Kitchen guaiFENesin (MUCINEX) 600 MG 12 hr tablet Take 1,200 mg by mouth 2 (two) times daily as needed. For cough      . lidocaine-prilocaine (EMLA) cream Apply topically as needed.  30 g  6  . lisinopril-hydrochlorothiazide (PRINZIDE,ZESTORETIC) 20-25 MG per tablet Take 1 tablet by mouth daily.      . ondansetron (ZOFRAN) 8 MG tablet Take by mouth  every 8 (eight) hours as needed for nausea.      . potassium chloride SA (K-DUR,KLOR-CON) 20 MEQ tablet Take 1 tablet (20 mEq total) by mouth daily.  30 tablet  0  . pravastatin (PRAVACHOL) 40 MG tablet Take 40 mg by mouth daily.      Marland Kitchen Prochlorperazine Maleate (COMPAZINE PO) Take by mouth.      . sodium chloride (OCEAN) 0.65 % nasal spray Place 4 sprays into the nose daily as needed. For congestion.      Marland Kitchen oxyCODONE-acetaminophen (PERCOCET/ROXICET) 5-325 MG per tablet Take 1-2 tablets by mouth every 4 (four) hours as needed.  40 tablet  0   No current facility-administered medications for this visit.    SURGICAL HISTORY:  Past Surgical History  Procedure Laterality Date  . Cesarean section  25 years ago  . Tubal ligation    . Mastectomy Right   . Modified mastectomy Right 05/20/2012    Procedure: MODIFIED MASTECTOMY;  Surgeon: Mariella Saa, MD;  Location: [facility] OR;  Service: General;  Laterality: Right;  . Portacath placement Left 05/20/2012    Procedure: INSERTION PORT-A-CATH;  Surgeon: Mariella Saa, MD;  Location: [facility] OR;  Service: General;  Laterality: Left;  . Breast surgery Right 05/20/12    Rt br mastectomy    REVIEW OF SYSTEMS:  General: fatigue (+), night sweats (-), fever (-), pain (+) Lymph: palpable nodes (-) HEENT: vision changes (-), mucositis (-), gum bleeding (-), epistaxis (-) Cardiovascular: chest pain (-), palpitations (-) Pulmonary: shortness of breath (-), dyspnea on exertion (-), cough (-), hemoptysis (-) GI:  Early satiety (-), melena (-), dysphagia (-), nausea/vomiting (+), diarrhea (-) GU: dysuria (-), hematuria (-), incontinence (-) Musculoskeletal: joint swelling (-), joint pain (-), back pain (-) Neuro: weakness (-), numbness (+), headache (-), confusion (-) Skin: Rash (-), lesions (-), dryness (-) Psych: depression (-), suicidal/homicidal ideation (-), feeling of hopelessness (-)   PHYSICAL EXAMINATION: There were no vitals taken for this  visit. There is no weight on file to calculate BMI. General: Patient is a well appearing female in no acute distress HEENT: PERRLA, sclerae anicteric no conjunctival pallor, MMM Neck: supple, no palpable adenopathy Lungs: clear to auscultation bilaterally, no wheezes, rhonchi, or rales Cardiovascular: regular rate rhythm, S1, S2, no murmurs, rubs or gallops Abdomen: Soft, non-tender, non-distended, normoactive bowel sounds, no HSM Extremities: warm and well perfused, no clubbing, cyanosis, or edema Skin: No rashes or lesions Neuro: Non-focal ECOG PERFORMANCE STATUS: 1 - Symptomatic but completely ambulatory      LABORATORY DATA: Lab Results  Component Value Date   WBC 30.4* 07/22/2012   HGB 10.6* 07/22/2012   HCT 33.8* 07/22/2012   MCV 85.6 07/22/2012   PLT 258 07/22/2012      Chemistry      Component Value Date/Time   NA 138 07/14/2012 1406   NA 138 05/21/2012 0515   K 3.4*  07/14/2012 1406   K 4.2 05/21/2012 0515   CL 103 07/14/2012 1406   CL 104 05/21/2012 0515   CO2 26 07/14/2012 1406   CO2 25 05/21/2012 0515   BUN 10.6 07/14/2012 1406   BUN 10 05/21/2012 0515   CREATININE 0.9 07/14/2012 1406   CREATININE 0.89 05/21/2012 0515   CREATININE 0.85 03/10/2012 1544      Component Value Date/Time   CALCIUM 9.4 07/14/2012 1406   CALCIUM 9.1 05/21/2012 0515   ALKPHOS 61 07/14/2012 1406   ALKPHOS 47 04/06/2012 2306   AST 14 07/14/2012 1406   AST 21 04/06/2012 2306   ALT 14 07/14/2012 1406   ALT 16 04/06/2012 2306   BILITOT 0.33 07/14/2012 1406   BILITOT 0.2* 04/06/2012 2306       RADIOGRAPHIC STUDIES:  No results found.  ASSESSMENT: 57 year old female with  #1 stage II a multifocal invasive ductal carcinoma of the right breast status post mastectomy with axillary lymph node dissection. Postoperatively she is doing well.   #2 patient will be treated with adjuvant chemotherapy consisting of Taxotere Cytoxan every 3 weeks for a total of 6 cycles. Risks and benefits of treatment were discussed  with the patient in detail. She re: has a Port-A-Cath placed. I will get chemotherapy teaching class. Her orders were placed and the system today and all of her antiemetics percent to her pharmacy.  #3 Hypokalemia  #4 Neuropathy  PLAN:   1. Doing well.  Labs look good.  She is not neutropenic and tolerated chemotherapy relatively well.   I reassured her about her fatigue, nausea, and taste changes as they are common side effects with chemotherapy.    2. We will f/u on Ms. Jimmerson's potassium and prescribe Kdur if needed.    3. I recommended Super B complex for Mrs. Ramos to take daily for the numbness.    4. She will return in 2 weeks for cycle 3 of chemotherapy.  All questions were answered. The patient knows to call the clinic with any problems, questions or concerns. We can certainly see the patient much sooner if necessary.  I spent 25 minutes counseling the patient face to face. The total time spent in the appointment was 30 minutes.  Cherie Ouch Lyn Hollingshead, NP Medical Oncology [facility] Phone: [phone] 07/22/2012, 9:06 AM

## 2012-07-22 NOTE — Patient Instructions (Addendum)
Doing well.  Labs look good.  Take Super B complex, one tablet daily for the numbness.  Please call us if you have any questions or concerns.    We will see you back in 2 weeks.

## 2012-07-23 ENCOUNTER — Ambulatory Visit: Payer: Medicaid Other

## 2012-07-24 ENCOUNTER — Ambulatory Visit: Payer: Medicaid Other

## 2012-07-29 ENCOUNTER — Ambulatory Visit: Payer: Medicaid Other | Admitting: Adult Health

## 2012-07-29 ENCOUNTER — Other Ambulatory Visit: Payer: Medicaid Other | Admitting: Lab

## 2012-08-05 ENCOUNTER — Other Ambulatory Visit (HOSPITAL_BASED_OUTPATIENT_CLINIC_OR_DEPARTMENT_OTHER): Payer: Medicaid Other

## 2012-08-05 ENCOUNTER — Ambulatory Visit (HOSPITAL_BASED_OUTPATIENT_CLINIC_OR_DEPARTMENT_OTHER): Payer: No Typology Code available for payment source

## 2012-08-05 ENCOUNTER — Ambulatory Visit (HOSPITAL_BASED_OUTPATIENT_CLINIC_OR_DEPARTMENT_OTHER): Payer: No Typology Code available for payment source | Admitting: Adult Health

## 2012-08-05 ENCOUNTER — Encounter: Payer: Self-pay | Admitting: Adult Health

## 2012-08-05 VITALS — BP 132/77 | HR 71 | Temp 98.6°F | Resp 20 | Ht 63.0 in | Wt 181.5 lb

## 2012-08-05 DIAGNOSIS — G589 Mononeuropathy, unspecified: Secondary | ICD-10-CM

## 2012-08-05 DIAGNOSIS — C50411 Malignant neoplasm of upper-outer quadrant of right female breast: Secondary | ICD-10-CM

## 2012-08-05 DIAGNOSIS — Z170 Estrogen receptor positive status [ER+]: Secondary | ICD-10-CM

## 2012-08-05 DIAGNOSIS — C50419 Malignant neoplasm of upper-outer quadrant of unspecified female breast: Secondary | ICD-10-CM

## 2012-08-05 DIAGNOSIS — Z5111 Encounter for antineoplastic chemotherapy: Secondary | ICD-10-CM

## 2012-08-05 DIAGNOSIS — M898X9 Other specified disorders of bone, unspecified site: Secondary | ICD-10-CM

## 2012-08-05 DIAGNOSIS — E876 Hypokalemia: Secondary | ICD-10-CM

## 2012-08-05 LAB — CBC WITH DIFFERENTIAL/PLATELET
BASO%: 0.6 % (ref 0.0–2.0)
Basophils Absolute: 0 10*3/uL (ref 0.0–0.1)
EOS%: 0.8 % (ref 0.0–7.0)
Eosinophils Absolute: 0 10*3/uL (ref 0.0–0.5)
HCT: 33.8 % — ABNORMAL LOW (ref 34.8–46.6)
HGB: 10.9 g/dL — ABNORMAL LOW (ref 11.6–15.9)
LYMPH%: 27.1 % (ref 14.0–49.7)
MCH: 27.6 pg (ref 25.1–34.0)
MCHC: 32.2 g/dL (ref 31.5–36.0)
MCV: 85.7 fL (ref 79.5–101.0)
MONO#: 0.5 10*3/uL (ref 0.1–0.9)
MONO%: 13.4 % (ref 0.0–14.0)
NEUT#: 2.3 10*3/uL (ref 1.5–6.5)
NEUT%: 58.1 % (ref 38.4–76.8)
Platelets: 232 10*3/uL (ref 145–400)
RBC: 3.95 10*6/uL (ref 3.70–5.45)
RDW: 16.5 % — ABNORMAL HIGH (ref 11.2–14.5)
WBC: 4 10*3/uL (ref 3.9–10.3)
lymph#: 1.1 10*3/uL (ref 0.9–3.3)

## 2012-08-05 LAB — COMPREHENSIVE METABOLIC PANEL (CC13)
ALT: 25 U/L (ref 0–55)
AST: 19 U/L (ref 5–34)
Albumin: 3.7 g/dL (ref 3.5–5.0)
Alkaline Phosphatase: 62 U/L (ref 40–150)
BUN: 10.5 mg/dL (ref 7.0–26.0)
CO2: 27 mEq/L (ref 22–29)
Calcium: 9.8 mg/dL (ref 8.4–10.4)
Chloride: 104 mEq/L (ref 98–107)
Creatinine: 0.9 mg/dL (ref 0.6–1.1)
Glucose: 112 mg/dl — ABNORMAL HIGH (ref 70–99)
Potassium: 3.7 mEq/L (ref 3.5–5.1)
Sodium: 142 mEq/L (ref 136–145)
Total Bilirubin: 0.43 mg/dL (ref 0.20–1.20)
Total Protein: 7.1 g/dL (ref 6.4–8.3)

## 2012-08-05 MED ORDER — HEPARIN SOD (PORK) LOCK FLUSH 100 UNIT/ML IV SOLN
500.0000 [IU] | Freq: Once | INTRAVENOUS | Status: AC | PRN
  Administered 2012-08-05: 500 [IU]
  Filled 2012-08-05: qty 5

## 2012-08-05 MED ORDER — SODIUM CHLORIDE 0.9 % IJ SOLN
10.0000 mL | INTRAMUSCULAR | Status: DC | PRN
  Administered 2012-08-05: 10 mL
  Filled 2012-08-05: qty 10

## 2012-08-05 MED ORDER — ACETAMINOPHEN 325 MG PO TABS: 650.0000 mg | ORAL_TABLET | Freq: Four times a day (QID) | ORAL | Status: DC | PRN

## 2012-08-05 MED ORDER — SODIUM CHLORIDE 0.9 % IV SOLN
Freq: Once | INTRAVENOUS | Status: AC
  Administered 2012-08-05: 11:00:00 via INTRAVENOUS

## 2012-08-05 MED ORDER — ONDANSETRON 16 MG/50ML IVPB (CHCC)
16.0000 mg | Freq: Once | INTRAVENOUS | Status: AC
  Administered 2012-08-05: 16 mg via INTRAVENOUS

## 2012-08-05 MED ORDER — DEXAMETHASONE SODIUM PHOSPHATE 20 MG/5ML IJ SOLN
20.0000 mg | Freq: Once | INTRAMUSCULAR | Status: AC
  Administered 2012-08-05: 20 mg via INTRAVENOUS

## 2012-08-05 MED ORDER — LORATADINE 10 MG PO TABS: 10.0000 mg | ORAL_TABLET | Freq: Every day | ORAL | Status: DC

## 2012-08-05 MED ORDER — SODIUM CHLORIDE 0.9 % IV SOLN
600.0000 mg/m2 | Freq: Once | INTRAVENOUS | Status: AC
  Administered 2012-08-05: 1120 mg via INTRAVENOUS
  Filled 2012-08-05: qty 56

## 2012-08-05 MED ORDER — SODIUM CHLORIDE 0.9 % IV SOLN
75.0000 mg/m2 | Freq: Once | INTRAVENOUS | Status: AC
  Administered 2012-08-05: 140 mg via INTRAVENOUS
  Filled 2012-08-05: qty 14

## 2012-08-05 NOTE — Patient Instructions (Signed)
Doing well.  Proceed with chemotherapy.  We will let you know if you need to take any more potassium.  Continue Super B complex daily.  Please call us if you have any questions or concerns.

## 2012-08-05 NOTE — Progress Notes (Signed)
OFFICE PROGRESS NOTE  CCGildardo Cranker, DO [address] [place] [address]  DIAGNOSIS: 57 year old female with stage IIB right breast cancer, ER positive, PR positive, HER-2/neu negative.    PRIOR THERAPY:#1 .Who was seen in the multidisciplinary breast clinic for consultation. Patient noted a mass in the right breast about 4 months ago. She's had a mammogram every year on an annual basis. On her prior mammogram there were no suspicious findings. But because of the mass she presented for workup for the palpable mass. On mammogram there was noted to be an abnormality at the 11:00 position in the right breast corresponding to the palpable abnormality. There were also noted to be 2 additional suspicious masses within the right breast as well as abnormal right axillary lymph nodes. Patient went on to have an ultrasound-guided core needle biopsy of all 3 masses and the axillary lymph node. All 3 masses were positive for invasive ductal carcinoma. 2 primaries appear to be HER-2/neu negative ER positive PR +1 of the primaries demonstrated mucinous features with Ki-67 20%. The lymph node had a higher Ki-67 of 70%. Indicating there were 2 synchronous primaries. Patient underwent MRI of the breasts. Again 3 lesions were noted within the right breast. The larger mass was at the 10:00 position measuring 2.3 cm. An abnormal level I right axillary lymph node measures 3.0 cm. No abnormalities in the contralateral breast   #2 Patient is now status post right modified radical mastectomy. She was found to have multifocal disease. Tumor was ER PR positive HER-2/neu negative. She had one of 17 lymph nodes positive for metastatic disease. We discussed her pathology in detail today.   #3 patient is a good candidate for adjuvant chemotherapy. We discussed Taxotere Cytoxan every 3 weeks for a total of 6 cycles. Hopefully we can begin this towards the end of March. I discussed the side effects risks and  benefits of it. She will need to radiation therapy and she will be referred back to radiation oncology after she completes her chemotherapy.   CURRENT THERAPY: Taxotere Cytoxan cycle 3 day 1  INTERVAL HISTORY: Tasha Ortiz 57 y.o. female returns for f/u prior to her third cycle of Taxotere/Cytoxan.  Her numbness has resolved and she is taking Super B complex daily.  She is currently in school for [employer] and has been doing increasing amounts of work lately due to it being exam time.  She is very tired today and feels like this is the main contributing factor.  She denies fevers, chills, nausea, vomiting, constipation, diarrhea.  Otherwise, a 10 point ROS is neg.  She would like Claritin and Tylenol called into the [place] pharmacy that she takes for her neulasta induced bone pain.    MEDICAL HISTORY: Past Medical History  Diagnosis Date  . Hypertension   . Hypercholesteremia     taken off chol meds Dec 2012  . Acid reflux   . Breast cancer   . Heartburn     ALLERGIES:  has No Known Allergies.  MEDICATIONS:  Current Outpatient Prescriptions  Medication Sig Dispense Refill  . dexamethasone (DECADRON) 1 MG tablet Take 1 mg by mouth 2 (two) times daily with a meal.      . famotidine (PEPCID) 20 MG tablet Take 20 mg by mouth 2 (two) times daily.      Marland Kitchen guaiFENesin (MUCINEX) 600 MG 12 hr tablet Take 1,200 mg by mouth 2 (two) times daily as needed. For cough      .  lidocaine-prilocaine (EMLA) cream Apply topically as needed.  30 g  6  . lisinopril-hydrochlorothiazide (PRINZIDE,ZESTORETIC) 20-25 MG per tablet Take 1 tablet by mouth daily.      . ondansetron (ZOFRAN) 8 MG tablet Take by mouth every 8 (eight) hours as needed for nausea.      Marland Kitchen oxyCODONE-acetaminophen (PERCOCET/ROXICET) 5-325 MG per tablet Take 1-2 tablets by mouth every 4 (four) hours as needed.  40 tablet  0  . potassium chloride SA (K-DUR,KLOR-CON) 20 MEQ tablet Take 1 tablet (20 mEq total) by mouth daily.   30 tablet  0  . pravastatin (PRAVACHOL) 40 MG tablet Take 40 mg by mouth daily.      Marland Kitchen Prochlorperazine Maleate (COMPAZINE PO) Take by mouth.      . sodium chloride (OCEAN) 0.65 % nasal spray Place 4 sprays into the nose daily as needed. For congestion.       No current facility-administered medications for this visit.    SURGICAL HISTORY:  Past Surgical History  Procedure Laterality Date  . Cesarean section  25 years ago  . Tubal ligation    . Mastectomy Right   . Modified mastectomy Right 05/20/2012    Procedure: MODIFIED MASTECTOMY;  Surgeon: Mariella Saa, MD;  Location: [facility] OR;  Service: General;  Laterality: Right;  . Portacath placement Left 05/20/2012    Procedure: INSERTION PORT-A-CATH;  Surgeon: Mariella Saa, MD;  Location: [facility] OR;  Service: General;  Laterality: Left;  . Breast surgery Right 05/20/12    Rt br mastectomy    REVIEW OF SYSTEMS:  General: fatigue (+), night sweats (-), fever (-), pain (-) Lymph: palpable nodes (-) HEENT: vision changes (-), mucositis (-), gum bleeding (-), epistaxis (-) Cardiovascular: chest pain (-), palpitations (-) Pulmonary: shortness of breath (-), dyspnea on exertion (-), cough (-), hemoptysis (-) GI:  Early satiety (-), melena (-), dysphagia (-), nausea/vomiting (-), diarrhea (-) GU: dysuria (-), hematuria (-), incontinence (-) Musculoskeletal: joint swelling (-), joint pain (-), back pain (-) Neuro: weakness (-), numbness (-), headache (-), confusion (-) Skin: Rash (-), lesions (-), dryness (-) Psych: depression (-), suicidal/homicidal ideation (-), feeling of hopelessness (-)   PHYSICAL EXAMINATION: Blood pressure 132/77, pulse 71, temperature 98.6 F (37 C), temperature source Oral, resp. rate 20, height 5\' 3"  (1.6 m), weight 181 lb 8 oz (82.328 kg). Body mass index is 32.16 kg/(m^2). General: Patient is a well appearing female in no acute distress HEENT: PERRLA, sclerae anicteric no conjunctival pallor, MMM Neck:  supple, no palpable adenopathy Lungs: clear to auscultation bilaterally, no wheezes, rhonchi, or rales Cardiovascular: regular rate rhythm, S1, S2, no murmurs, rubs or gallops Abdomen: Soft, non-tender, non-distended, normoactive bowel sounds, no HSM Extremities: warm and well perfused, no clubbing, cyanosis, or edema Skin: No rashes or lesions Neuro: Non-focal Breasts: right mastecomy site well healed, no nodularity, left breast, no masses, nodules or skin changes noted ECOG PERFORMANCE STATUS: 1 - Symptomatic but completely ambulatory      LABORATORY DATA: Lab Results  Component Value Date   WBC 4.0 08/05/2012   HGB 10.9* 08/05/2012   HCT 33.8* 08/05/2012   MCV 85.7 08/05/2012   PLT 232 08/05/2012      Chemistry      Component Value Date/Time   NA 138 07/22/2012 0831   NA 138 05/21/2012 0515   K 3.8 07/22/2012 0831   K 4.2 05/21/2012 0515   CL 103 07/22/2012 0831   CL 104 05/21/2012 0515   CO2 23 07/22/2012 0831  CO2 25 05/21/2012 0515   BUN 11.6 07/22/2012 0831   BUN 10 05/21/2012 0515   CREATININE 1.0 07/22/2012 0831   CREATININE 0.89 05/21/2012 0515   CREATININE 0.85 03/10/2012 1544      Component Value Date/Time   CALCIUM 9.4 07/22/2012 0831   CALCIUM 9.1 05/21/2012 0515   ALKPHOS 125 07/22/2012 0831   ALKPHOS 47 04/06/2012 2306   AST 23 07/22/2012 0831   AST 21 04/06/2012 2306   ALT 30 07/22/2012 0831   ALT 16 04/06/2012 2306   BILITOT <0.20 Repeated and Verified 07/22/2012 0831   BILITOT 0.2* 04/06/2012 2306       RADIOGRAPHIC STUDIES:  No results found.  ASSESSMENT: 57 year old female with  #1 stage II a multifocal invasive ductal carcinoma of the right breast status post mastectomy with axillary lymph node dissection. Postoperatively she is doing well.   #2 patient will be treated with adjuvant chemotherapy consisting of Taxotere Cytoxan every 3 weeks for a total of 6 cycles. Risks and benefits of treatment were discussed with the patient in detail. She started this on 06/24/2012  and is tolerating it relatively well.   #3 Hypokalemia  #4 Neuropathy  PLAN:   1. Doing well.  Labs look good.  She will proceed with chemotherapy today.    2. Ms. Ortiz potassium is pending today.  Her potassium on 4/22 was 3.8.  We will follow up on her potassium level today and give her instructions on potassium if needed.     3. I recommended Super B complex for Tasha Ortiz to take daily for the numbness.  The numbness has resolved.    4. She will return in one week for follow up with labs and an evaluation for chemotoxicities.  All questions were answered. The patient knows to call the clinic with any problems, questions or concerns. We can certainly see the patient much sooner if necessary.  I spent 25 minutes counseling the patient face to face. The total time spent in the appointment was 30 minutes.  Cherie Ouch Lyn Hollingshead, NP Medical Oncology [facility] Phone: [phone] 08/05/2012, 9:15 AM

## 2012-08-05 NOTE — Patient Instructions (Addendum)
[facility] Discharge Instructions for Patients Receiving Chemotherapy  Today you received the following chemotherapy agents: Taxotere, Cytoxan.  To help prevent nausea and vomiting after your treatment, we encourage you to take your nausea medication.   If you develop nausea and vomiting that is not controlled by your nausea medication, call the clinic.   BELOW ARE SYMPTOMS THAT SHOULD BE REPORTED IMMEDIATELY:  *FEVER GREATER THAN 100.5 F  *CHILLS WITH OR WITHOUT FEVER  NAUSEA AND VOMITING THAT IS NOT CONTROLLED WITH YOUR NAUSEA MEDICATION  *UNUSUAL SHORTNESS OF BREATH  *UNUSUAL BRUISING OR BLEEDING  TENDERNESS IN MOUTH AND THROAT WITH OR WITHOUT PRESENCE OF ULCERS  *URINARY PROBLEMS  *BOWEL PROBLEMS  UNUSUAL RASH Items with * indicate a potential emergency and should be followed up as soon as possible.  Feel free to call the clinic you have any questions or concerns. The clinic phone number is [phone].

## 2012-08-06 ENCOUNTER — Ambulatory Visit (HOSPITAL_BASED_OUTPATIENT_CLINIC_OR_DEPARTMENT_OTHER): Payer: No Typology Code available for payment source

## 2012-08-06 ENCOUNTER — Encounter: Payer: Self-pay | Admitting: Oncology

## 2012-08-06 VITALS — BP 166/75 | HR 85 | Temp 98.2°F

## 2012-08-06 DIAGNOSIS — C50419 Malignant neoplasm of upper-outer quadrant of unspecified female breast: Secondary | ICD-10-CM

## 2012-08-06 DIAGNOSIS — C50411 Malignant neoplasm of upper-outer quadrant of right female breast: Secondary | ICD-10-CM

## 2012-08-06 MED ORDER — PEGFILGRASTIM INJECTION 6 MG/0.6ML
6.0000 mg | Freq: Once | SUBCUTANEOUS | Status: AC
  Administered 2012-08-06: 6 mg via SUBCUTANEOUS
  Filled 2012-08-06: qty 0.6

## 2012-08-06 NOTE — Progress Notes (Signed)
Called and advised the patient in order to pay rent of 200.00(rent is 750.00) but her daughter pays the difference we need letter that she is going to do that-since we are not paying full rent([employer]). She will see if she can get her daughter to get the note to me.

## 2012-08-12 ENCOUNTER — Ambulatory Visit (HOSPITAL_BASED_OUTPATIENT_CLINIC_OR_DEPARTMENT_OTHER): Payer: Medicaid Other | Admitting: Adult Health

## 2012-08-12 ENCOUNTER — Ambulatory Visit: Payer: Medicaid Other

## 2012-08-12 ENCOUNTER — Encounter: Payer: Self-pay | Admitting: Adult Health

## 2012-08-12 ENCOUNTER — Other Ambulatory Visit (HOSPITAL_BASED_OUTPATIENT_CLINIC_OR_DEPARTMENT_OTHER): Payer: Medicaid Other | Admitting: Lab

## 2012-08-12 VITALS — BP 111/73 | HR 85 | Temp 98.2°F | Resp 20 | Ht 63.0 in | Wt 178.2 lb

## 2012-08-12 DIAGNOSIS — C50419 Malignant neoplasm of upper-outer quadrant of unspecified female breast: Secondary | ICD-10-CM

## 2012-08-12 DIAGNOSIS — C50411 Malignant neoplasm of upper-outer quadrant of right female breast: Secondary | ICD-10-CM

## 2012-08-12 LAB — COMPREHENSIVE METABOLIC PANEL (CC13)
ALT: 38 U/L (ref 0–55)
AST: 27 U/L (ref 5–34)
Albumin: 3.5 g/dL (ref 3.5–5.0)
Alkaline Phosphatase: 90 U/L (ref 40–150)
BUN: 7.2 mg/dL (ref 7.0–26.0)
CO2: 26 mEq/L (ref 22–29)
Calcium: 9.6 mg/dL (ref 8.4–10.4)
Chloride: 101 mEq/L (ref 98–107)
Creatinine: 0.9 mg/dL (ref 0.6–1.1)
Glucose: 150 mg/dl — ABNORMAL HIGH (ref 70–99)
Potassium: 3.1 mEq/L — ABNORMAL LOW (ref 3.5–5.1)
Sodium: 138 mEq/L (ref 136–145)
Total Bilirubin: 0.24 mg/dL (ref 0.20–1.20)
Total Protein: 7.1 g/dL (ref 6.4–8.3)

## 2012-08-12 LAB — CBC WITH DIFFERENTIAL/PLATELET
BASO%: 0.5 % (ref 0.0–2.0)
Basophils Absolute: 0.1 10*3/uL (ref 0.0–0.1)
EOS%: 0.7 % (ref 0.0–7.0)
Eosinophils Absolute: 0.1 10*3/uL (ref 0.0–0.5)
HCT: 32.8 % — ABNORMAL LOW (ref 34.8–46.6)
HGB: 10.7 g/dL — ABNORMAL LOW (ref 11.6–15.9)
LYMPH%: 8.7 % — ABNORMAL LOW (ref 14.0–49.7)
MCH: 28 pg (ref 25.1–34.0)
MCHC: 32.6 g/dL (ref 31.5–36.0)
MCV: 85.8 fL (ref 79.5–101.0)
MONO#: 2.1 10*3/uL — ABNORMAL HIGH (ref 0.1–0.9)
MONO%: 10.8 % (ref 0.0–14.0)
NEUT#: 15.3 10*3/uL — ABNORMAL HIGH (ref 1.5–6.5)
NEUT%: 79.3 % — ABNORMAL HIGH (ref 38.4–76.8)
Platelets: 255 10*3/uL (ref 145–400)
RBC: 3.82 10*6/uL (ref 3.70–5.45)
RDW: 16.4 % — ABNORMAL HIGH (ref 11.2–14.5)
WBC: 19.3 10*3/uL — ABNORMAL HIGH (ref 3.9–10.3)
lymph#: 1.7 10*3/uL (ref 0.9–3.3)

## 2012-08-12 NOTE — Patient Instructions (Addendum)
Doing well.  Take Kdur PO daily x 7 days.  Start your exercises for your arm.  We will see you back in 2 weeks for your next chemotherapy cycle.  Please call us if you have any questions or concerns.    Hypokalemia Hypokalemia means a low potassium level in the blood.Potassium is an electrolyte that helps regulate the amount of fluid in the body. It also stimulates muscle contraction and maintains a stable acid-base balance.Most of the body's potassium is inside of cells, and only a very small amount is in the blood. Because the amount in the blood is so small, minor changes can have big effects. PREPARATION FOR TEST Testing for potassium requires taking a blood sample taken by needle from a vein in the arm. The skin is cleaned thoroughly before the sample is drawn. There is no other special preparation needed. NORMAL VALUES Potassium levels below 3.5 mEq/L are abnormally low. Levels above 5.1 mEq/L are abnormally high. Ranges for normal findings may vary among different laboratories and hospitals. You should always check with your doctor after having lab work or other tests done to discuss the meaning of your test results and whether your values are considered within normal limits. MEANING OF TEST  Your caregiver will go over the test results with you and discuss the importance and meaning of your results, as well as treatment options and the need for additional tests, if necessary. A potassium level is frequently part of a routine medical exam. It is usually included as part of a whole "panel" of tests for several blood salts (such as Sodium and Chloride). It may be done as part of follow-up when a low potassium level was found in the past or other blood salts are suspected of being out of balance. A low potassium level might be suspected if you have one or more of the following:  Symptoms of weakness.  Abnormal heart rhythms.  High blood pressure and are taking medication to control  this, especially water pills (diuretics).  Kidney disease that can affect your potassium level .  Diabetes requiring the use of insulin. The potassium may fall after taking insulin, especially if the diabetes had been out of control for a while.  A condition requiring the use of cortisone-type medication or certain types of antibiotics.  Vomiting and/or diarrhea for more than a day or two.  A stomach or intestinal condition that may not permit appropriate absorption of potassium.  Fainting episodes.  Mental confusion. OBTAINING TEST RESULTS It is your responsibility to obtain your test results. Ask the lab or department performing the test when and how you will get your results.  Please contact your caregiver directly if you have not received the results within one week. At that time, ask if there is anything different or new you should be doing in relation to the results. TREATMENT Hypokalemia can be treated with potassium supplements taken by mouth and/or adjustments in your current medications. A diet high in potassium is also helpful. Foods with high potassium content are:  Peas, lentils, lima beans, nuts, and dried fruit.  Whole grain and bran cereals and breads.  Fresh fruit, vegetables (bananas, cantaloupe, grapefruit, oranges, tomatoes, honeydew melons, potatoes).  Orange and tomato juices.  Meats. If potassium supplement has been prescribed for you today or your medications have been adjusted, see your personal caregiver in time02 for a re-check. SEEK MEDICAL CARE IF:  There is a feeling of worsening weakness.  You experience repeated chest  palpitations.  You are diabetic and having difficulty keeping your blood sugars in the normal range.  You are experiencing vomiting and/or diarrhea.  You are having difficulty with any of your regular medications. SEEK IMMEDIATE MEDICAL CARE IF:  You experience chest pain, shortness of breath, or episodes of dizziness.  You  have been having vomiting or diarrhea for more than 2 days.  You have a fainting episode. MAKE SURE YOU:   Understand these instructions.  Will watch your condition.  Will get help right away if you are not doing well or get worse. Document Released: 03/19/2005 Document Revised: 06/11/2011 Document Reviewed: 02/28/2008 [facility] Patient Information 2013 [place], [place].

## 2012-08-12 NOTE — Progress Notes (Signed)
OFFICE PROGRESS NOTE  CCGildardo Cranker, DO [address] [place] [address]  DIAGNOSIS: 57 year old female with stage IIB right breast cancer, ER positive, PR positive, HER-2/neu negative.    PRIOR THERAPY:#1 .Who was seen in the multidisciplinary breast clinic for consultation. Patient noted a mass in the right breast about 4 months ago. She's had a mammogram every year on an annual basis. On her prior mammogram there were no suspicious findings. But because of the mass she presented for workup for the palpable mass. On mammogram there was noted to be an abnormality at the 11:00 position in the right breast corresponding to the palpable abnormality. There were also noted to be 2 additional suspicious masses within the right breast as well as abnormal right axillary lymph nodes. Patient went on to have an ultrasound-guided core needle biopsy of all 3 masses and the axillary lymph node. All 3 masses were positive for invasive ductal carcinoma. 2 primaries appear to be HER-2/neu negative ER positive PR +1 of the primaries demonstrated mucinous features with Ki-67 20%. The lymph node had a higher Ki-67 of 70%. Indicating there were 2 synchronous primaries. Patient underwent MRI of the breasts. Again 3 lesions were noted within the right breast. The larger mass was at the 10:00 position measuring 2.3 cm. An abnormal level I right axillary lymph node measures 3.0 cm. No abnormalities in the contralateral breast   #2 Patient is now status post right modified radical mastectomy. She was found to have multifocal disease. Tumor was ER PR positive HER-2/neu negative. She had one of 17 lymph nodes positive for metastatic disease. We discussed her pathology in detail today.   #3 patient is a good candidate for adjuvant chemotherapy. We discussed Taxotere Cytoxan every 3 weeks for a total of 6 cycles. She will need to radiation therapy and she will be referred back to radiation oncology after she  completes her chemotherapy.   CURRENT THERAPY: Taxotere Cytoxan cycle 3 day 8  INTERVAL HISTORY: Tasha Ortiz 57 y.o. female returns for f/u after her third cycle of Taxotere and Cytoxan.  She is doing very well today.  She has intermittent indigestion, and is having tightness in her right arm since her surgery.  She has not been doing the range of motion exercises that were given during the ABC class, though she still has the handout with the information about it.  She denies fevers, chills, nausea, vomiting, constipation, diarrhea, numbness, or any further concerns. Her appetite is good, and she is drinking a minimum of 8 glasses of water per day.   MEDICAL HISTORY: Past Medical History  Diagnosis Date  . Hypertension   . Hypercholesteremia     taken off chol meds Dec 2012  . Acid reflux   . Breast cancer   . Heartburn     ALLERGIES:  has No Known Allergies.  MEDICATIONS:  Current Outpatient Prescriptions  Medication Sig Dispense Refill  . acetaminophen (TYLENOL) 325 MG tablet Take 2 tablets (650 mg total) by mouth every 6 (six) hours as needed for pain.  60 tablet  2  . B Complex-C (SUPER B COMPLEX PO) Take 1 tablet by mouth daily.      Marland Kitchen dexamethasone (DECADRON) 1 MG tablet Take 1 mg by mouth 2 (two) times daily with a meal.      . famotidine (PEPCID) 20 MG tablet Take 20 mg by mouth 2 (two) times daily.      Marland Kitchen guaiFENesin (MUCINEX) 600 MG  12 hr tablet Take 1,200 mg by mouth 2 (two) times daily as needed. For cough      . lidocaine-prilocaine (EMLA) cream Apply topically as needed.  30 g  6  . lisinopril-hydrochlorothiazide (PRINZIDE,ZESTORETIC) 20-25 MG per tablet Take 1 tablet by mouth daily.      Marland Kitchen loratadine (CLARITIN) 10 MG tablet Take 1 tablet (10 mg total) by mouth daily.  30 tablet  2  . ondansetron (ZOFRAN) 8 MG tablet Take by mouth every 8 (eight) hours as needed for nausea.      Marland Kitchen oxyCODONE-acetaminophen (PERCOCET/ROXICET) 5-325 MG per tablet Take 1-2 tablets by  mouth every 4 (four) hours as needed.  40 tablet  0  . potassium chloride SA (K-DUR,KLOR-CON) 20 MEQ tablet Take 1 tablet (20 mEq total) by mouth daily.  30 tablet  0  . pravastatin (PRAVACHOL) 40 MG tablet Take 40 mg by mouth daily.      Marland Kitchen Prochlorperazine Maleate (COMPAZINE PO) Take by mouth.      . sodium chloride (OCEAN) 0.65 % nasal spray Place 4 sprays into the nose daily as needed. For congestion.       No current facility-administered medications for this visit.    SURGICAL HISTORY:  Past Surgical History  Procedure Laterality Date  . Cesarean section  25 years ago  . Tubal ligation    . Mastectomy Right   . Modified mastectomy Right 05/20/2012    Procedure: MODIFIED MASTECTOMY;  Surgeon: Mariella Saa, MD;  Location: [facility] OR;  Service: General;  Laterality: Right;  . Portacath placement Left 05/20/2012    Procedure: INSERTION PORT-A-CATH;  Surgeon: Mariella Saa, MD;  Location: [facility] OR;  Service: General;  Laterality: Left;  . Breast surgery Right 05/20/12    Rt br mastectomy    REVIEW OF SYSTEMS:  General: fatigue (+), night sweats (-), fever (-), pain (-) Lymph: palpable nodes (-) HEENT: vision changes (-), mucositis (-), gum bleeding (-), epistaxis (-) Cardiovascular: chest pain (-), palpitations (-) Pulmonary: shortness of breath (-), dyspnea on exertion (-), cough (-), hemoptysis (-) GI:  Early satiety (-), melena (-), dysphagia (-), nausea/vomiting (-), diarrhea (-) GU: dysuria (-), hematuria (-), incontinence (-) Musculoskeletal: joint swelling (-), joint pain (-), back pain (-) Neuro: weakness (-), numbness (-), headache (-), confusion (-) Skin: Rash (-), lesions (-), dryness (-) Psych: depression (-), suicidal/homicidal ideation (-), feeling of hopelessness (-)   PHYSICAL EXAMINATION: Blood pressure 111/73, pulse 85, temperature 98.2 F (36.8 C), temperature source Oral, resp. rate 20, height 5\' 3"  (1.6 m), weight 178 lb 3.2 oz (80.831 kg). Body mass index  is 31.57 kg/(m^2). General: Patient is a well appearing female in no acute distress HEENT: PERRLA, sclerae anicteric no conjunctival pallor, MMM Neck: supple, no palpable adenopathy Lungs: clear to auscultation bilaterally, no wheezes, rhonchi, or rales Cardiovascular: regular rate rhythm, S1, S2, no murmurs, rubs or gallops Abdomen: Soft, non-tender, non-distended, normoactive bowel sounds, no HSM Extremities: warm and well perfused, no clubbing, cyanosis, or edema Skin: No rashes or lesions Neuro: Non-focal Breasts: right mastecomy site well healed, no nodularity, left breast, no masses, nodules or skin changes noted ECOG PERFORMANCE STATUS: 1 - Symptomatic but completely ambulatory      LABORATORY DATA: Lab Results  Component Value Date   WBC 19.3* 08/12/2012   HGB 10.7* 08/12/2012   HCT 32.8* 08/12/2012   MCV 85.8 08/12/2012   PLT 255 08/12/2012      Chemistry      Component Value  Date/Time   NA 138 08/12/2012 1047   NA 138 05/21/2012 0515   K 3.1* 08/12/2012 1047   K 4.2 05/21/2012 0515   CL 101 08/12/2012 1047   CL 104 05/21/2012 0515   CO2 26 08/12/2012 1047   CO2 25 05/21/2012 0515   BUN 7.2 08/12/2012 1047   BUN 10 05/21/2012 0515   CREATININE 0.9 08/12/2012 1047   CREATININE 0.89 05/21/2012 0515   CREATININE 0.85 03/10/2012 1544      Component Value Date/Time   CALCIUM 9.6 08/12/2012 1047   CALCIUM 9.1 05/21/2012 0515   ALKPHOS 90 08/12/2012 1047   ALKPHOS 47 04/06/2012 2306   AST 27 08/12/2012 1047   AST 21 04/06/2012 2306   ALT 38 08/12/2012 1047   ALT 16 04/06/2012 2306   BILITOT 0.24 08/12/2012 1047   BILITOT 0.2* 04/06/2012 2306       RADIOGRAPHIC STUDIES:  No results found.  ASSESSMENT: 57 year old female with  #1 stage II a multifocal invasive ductal carcinoma of the right breast status post mastectomy with axillary lymph node dissection. Postoperatively she is doing well.   #2 patient will be treated with adjuvant chemotherapy consisting of Taxotere Cytoxan every 3  weeks for a total of 6 cycles. Risks and benefits of treatment were discussed with the patient in detail. She started this on 06/24/2012 and is tolerating it relatively well.   #3 Hypokalemia  #4 Neuropathy  PLAN:   1. Doing well.  Labs look good.    2. Ms. Kalmar potassium is 3.1 today.  She will take Kdur PO daily x 7 days.      3.The numbness has resolved, she will continue with taking Super B complex daily.    4. She will restart doing the arm exercises given to her in the ABC class.   5. She will return in two weeks for cycle 4 of chemotherapy.   All questions were answered. The patient knows to call the clinic with any problems, questions or concerns. We can certainly see the patient much sooner if necessary.  I spent 25 minutes counseling the patient face to face. The total time spent in the appointment was 30 minutes.  Cherie Ouch Lyn Hollingshead, NP Medical Oncology [facility] Phone: [phone] 08/13/2012, 8:44 AM

## 2012-08-13 ENCOUNTER — Ambulatory Visit: Payer: Medicaid Other

## 2012-08-19 ENCOUNTER — Ambulatory Visit: Payer: Medicaid Other | Admitting: Adult Health

## 2012-08-19 ENCOUNTER — Other Ambulatory Visit: Payer: Medicaid Other | Admitting: Lab

## 2012-08-21 ENCOUNTER — Ambulatory Visit: Payer: Medicaid Other

## 2012-08-26 ENCOUNTER — Encounter: Payer: Self-pay | Admitting: Adult Health

## 2012-08-26 ENCOUNTER — Ambulatory Visit (HOSPITAL_BASED_OUTPATIENT_CLINIC_OR_DEPARTMENT_OTHER): Payer: Medicaid Other | Admitting: Adult Health

## 2012-08-26 ENCOUNTER — Other Ambulatory Visit (HOSPITAL_BASED_OUTPATIENT_CLINIC_OR_DEPARTMENT_OTHER): Payer: Medicaid Other | Admitting: Lab

## 2012-08-26 ENCOUNTER — Ambulatory Visit (HOSPITAL_BASED_OUTPATIENT_CLINIC_OR_DEPARTMENT_OTHER): Payer: Medicaid Other

## 2012-08-26 VITALS — BP 104/70 | HR 91 | Temp 99.4°F | Resp 20 | Ht 63.0 in | Wt 181.3 lb

## 2012-08-26 DIAGNOSIS — C773 Secondary and unspecified malignant neoplasm of axilla and upper limb lymph nodes: Secondary | ICD-10-CM

## 2012-08-26 DIAGNOSIS — E876 Hypokalemia: Secondary | ICD-10-CM

## 2012-08-26 DIAGNOSIS — C50411 Malignant neoplasm of upper-outer quadrant of right female breast: Secondary | ICD-10-CM

## 2012-08-26 DIAGNOSIS — Z5111 Encounter for antineoplastic chemotherapy: Secondary | ICD-10-CM

## 2012-08-26 DIAGNOSIS — C50419 Malignant neoplasm of upper-outer quadrant of unspecified female breast: Secondary | ICD-10-CM

## 2012-08-26 DIAGNOSIS — G589 Mononeuropathy, unspecified: Secondary | ICD-10-CM

## 2012-08-26 LAB — CBC WITH DIFFERENTIAL/PLATELET
BASO%: 0.8 % (ref 0.0–2.0)
Basophils Absolute: 0 10*3/uL (ref 0.0–0.1)
EOS%: 0.8 % (ref 0.0–7.0)
Eosinophils Absolute: 0 10*3/uL (ref 0.0–0.5)
HCT: 34.7 % — ABNORMAL LOW (ref 34.8–46.6)
HGB: 11.4 g/dL — ABNORMAL LOW (ref 11.6–15.9)
LYMPH%: 26.8 % (ref 14.0–49.7)
MCH: 28.6 pg (ref 25.1–34.0)
MCHC: 32.9 g/dL (ref 31.5–36.0)
MCV: 87 fL (ref 79.5–101.0)
MONO#: 0.6 10*3/uL (ref 0.1–0.9)
MONO%: 13.2 % (ref 0.0–14.0)
NEUT#: 2.8 10*3/uL (ref 1.5–6.5)
NEUT%: 58.4 % (ref 38.4–76.8)
Platelets: 276 10*3/uL (ref 145–400)
RBC: 3.99 10*6/uL (ref 3.70–5.45)
RDW: 16.2 % — ABNORMAL HIGH (ref 11.2–14.5)
WBC: 4.9 10*3/uL (ref 3.9–10.3)
lymph#: 1.3 10*3/uL (ref 0.9–3.3)

## 2012-08-26 LAB — COMPREHENSIVE METABOLIC PANEL (CC13)
ALT: 17 U/L (ref 0–55)
AST: 20 U/L (ref 5–34)
Albumin: 3.7 g/dL (ref 3.5–5.0)
Alkaline Phosphatase: 54 U/L (ref 40–150)
BUN: 11.2 mg/dL (ref 7.0–26.0)
CO2: 29 mEq/L (ref 22–29)
Calcium: 9.9 mg/dL (ref 8.4–10.4)
Chloride: 104 mEq/L (ref 98–107)
Creatinine: 0.9 mg/dL (ref 0.6–1.1)
Glucose: 96 mg/dl (ref 70–99)
Potassium: 3.4 mEq/L — ABNORMAL LOW (ref 3.5–5.1)
Sodium: 142 mEq/L (ref 136–145)
Total Bilirubin: 0.29 mg/dL (ref 0.20–1.20)
Total Protein: 7 g/dL (ref 6.4–8.3)

## 2012-08-26 MED ORDER — SODIUM CHLORIDE 0.9 % IV SOLN
Freq: Once | INTRAVENOUS | Status: AC
  Administered 2012-08-26: 12:00:00 via INTRAVENOUS

## 2012-08-26 MED ORDER — SODIUM CHLORIDE 0.9 % IV SOLN
600.0000 mg/m2 | Freq: Once | INTRAVENOUS | Status: AC
  Administered 2012-08-26: 1120 mg via INTRAVENOUS
  Filled 2012-08-26: qty 56

## 2012-08-26 MED ORDER — SODIUM CHLORIDE 0.9 % IJ SOLN
10.0000 mL | INTRAMUSCULAR | Status: DC | PRN
  Administered 2012-08-26: 10 mL
  Filled 2012-08-26: qty 10

## 2012-08-26 MED ORDER — HEPARIN SOD (PORK) LOCK FLUSH 100 UNIT/ML IV SOLN
500.0000 [IU] | Freq: Once | INTRAVENOUS | Status: AC | PRN
  Administered 2012-08-26: 500 [IU]
  Filled 2012-08-26: qty 5

## 2012-08-26 MED ORDER — ONDANSETRON 16 MG/50ML IVPB (CHCC)
16.0000 mg | Freq: Once | INTRAVENOUS | Status: AC
  Administered 2012-08-26: 16 mg via INTRAVENOUS

## 2012-08-26 MED ORDER — DEXAMETHASONE SODIUM PHOSPHATE 20 MG/5ML IJ SOLN
20.0000 mg | Freq: Once | INTRAMUSCULAR | Status: AC
  Administered 2012-08-26: 20 mg via INTRAVENOUS

## 2012-08-26 MED ORDER — SODIUM CHLORIDE 0.9 % IV SOLN
75.0000 mg/m2 | Freq: Once | INTRAVENOUS | Status: AC
  Administered 2012-08-26: 140 mg via INTRAVENOUS
  Filled 2012-08-26: qty 14

## 2012-08-26 NOTE — Progress Notes (Signed)
OFFICE PROGRESS NOTE  CCGildardo Cranker, DO [address] [place] [address]  DIAGNOSIS: 57 year old female with stage IIB right breast cancer, ER positive, PR positive, HER-2/neu negative.    PRIOR THERAPY:#1 .Who was seen in the multidisciplinary breast clinic for consultation. Patient noted a mass in the right breast about 4 months ago. She's had a mammogram every year on an annual basis. On her prior mammogram there were no suspicious findings. But because of the mass she presented for workup for the palpable mass. On mammogram there was noted to be an abnormality at the 11:00 position in the right breast corresponding to the palpable abnormality. There were also noted to be 2 additional suspicious masses within the right breast as well as abnormal right axillary lymph nodes. Patient went on to have an ultrasound-guided core needle biopsy of all 3 masses and the axillary lymph node. All 3 masses were positive for invasive ductal carcinoma. 2 primaries appear to be HER-2/neu negative ER positive PR +1 of the primaries demonstrated mucinous features with Ki-67 20%. The lymph node had a higher Ki-67 of 70%. Indicating there were 2 synchronous primaries. Patient underwent MRI of the breasts. Again 3 lesions were noted within the right breast. The larger mass was at the 10:00 position measuring 2.3 cm. An abnormal level I right axillary lymph node measures 3.0 cm. No abnormalities in the contralateral breast   #2 Patient is now status post right modified radical mastectomy. She was found to have multifocal disease. Tumor was ER PR positive HER-2/neu negative. She had one of 17 lymph nodes positive for metastatic disease. We discussed her pathology in detail today.   #3 patient is a good candidate for adjuvant chemotherapy. We discussed Taxotere Cytoxan every 3 weeks for a total of 6 cycles. She will need to radiation therapy and she will be referred back to radiation oncology after she  completes her chemotherapy.   CURRENT THERAPY: Taxotere Cytoxan cycle 4 day 1  INTERVAL HISTORY: Tasha Ortiz 57 y.o. female returns for f/u prior to her fourth cycle of adjuvant Taxotere Cytoxan.  She denies fevers, chills, nausea, vomiting, constipation, diarrhea, skin changes, numbness, or any further concerns.  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  Otherwise, 10 point ROS is neg.   MEDICAL HISTORY: Past Medical History  Diagnosis Date  . Hypertension   . Hypercholesteremia     taken off chol meds Dec 2012  . Acid reflux   . Breast cancer   . Heartburn     ALLERGIES:  has No Known Allergies.  MEDICATIONS:  Current Outpatient Prescriptions  Medication Sig Dispense Refill  . acetaminophen (TYLENOL) 325 MG tablet Take 2 tablets (650 mg total) by mouth every 6 (six) hours as needed for pain.  60 tablet  2  . B Complex-C (SUPER B COMPLEX PO) Take 1 tablet by mouth daily.      Marland Kitchen dexamethasone (DECADRON) 1 MG tablet Take 1 mg by mouth 2 (two) times daily with a meal.      . famotidine (PEPCID) 20 MG tablet Take 20 mg by mouth 2 (two) times daily.      Marland Kitchen guaiFENesin (MUCINEX) 600 MG 12 hr tablet Take 1,200 mg by mouth 2 (two) times daily as needed. For cough      . lidocaine-prilocaine (EMLA) cream Apply topically as needed.  30 g  6  . lisinopril-hydrochlorothiazide (PRINZIDE,ZESTORETIC) 20-25 MG per tablet Take 1 tablet by mouth  daily.      . loratadine (CLARITIN) 10 MG tablet Take 1 tablet (10 mg total) by mouth daily.  30 tablet  2  . ondansetron (ZOFRAN) 8 MG tablet Take by mouth every 8 (eight) hours as needed for nausea.      Marland Kitchen oxyCODONE-acetaminophen (PERCOCET/ROXICET) 5-325 MG per tablet Take 1-2 tablets by mouth every 4 (four) hours as needed.  40 tablet  0  . potassium chloride SA (K-DUR,KLOR-CON) 20 MEQ tablet Take 1 tablet (20 mEq total) by mouth daily.  30 tablet  0  . pravastatin (PRAVACHOL) 40 MG tablet Take 40 mg by mouth daily.       Marland Kitchen Prochlorperazine Maleate (COMPAZINE PO) Take by mouth.      . sodium chloride (OCEAN) 0.65 % nasal spray Place 4 sprays into the nose daily as needed. For congestion.       No current facility-administered medications for this visit.    SURGICAL HISTORY:  Past Surgical History  Procedure Laterality Date  . Cesarean section  25 years ago  . Tubal ligation    . Mastectomy Right   . Modified mastectomy Right 05/20/2012    Procedure: MODIFIED MASTECTOMY;  Surgeon: Mariella Saa, MD;  Location: [facility] OR;  Service: General;  Laterality: Right;  . Portacath placement Left 05/20/2012    Procedure: INSERTION PORT-A-CATH;  Surgeon: Mariella Saa, MD;  Location: [facility] OR;  Service: General;  Laterality: Left;  . Breast surgery Right 05/20/12    Rt br mastectomy    REVIEW OF SYSTEMS:  General: fatigue (+), night sweats (-), fever (-), pain (-) Lymph: palpable nodes (-) HEENT: vision changes (-), mucositis (-), gum bleeding (-), epistaxis (-) Cardiovascular: chest pain (-), palpitations (-) Pulmonary: shortness of breath (-), dyspnea on exertion (-), cough (-), hemoptysis (-) GI:  Early satiety (-), melena (-), dysphagia (-), nausea/vomiting (-), diarrhea (-) GU: dysuria (-), hematuria (-), incontinence (-) Musculoskeletal: joint swelling (-), joint pain (-), back pain (-) Neuro: weakness (-), numbness (-), headache (-), confusion (-) Skin: Rash (-), lesions (-), dryness (-) Psych: depression (-), suicidal/homicidal ideation (-), feeling of hopelessness (-)   PHYSICAL EXAMINATION: There were no vitals taken for this visit. There is no weight on file to calculate BMI. General: Patient is a well appearing female in no acute distress HEENT: PERRLA, sclerae anicteric no conjunctival pallor, MMM Neck: supple, no palpable adenopathy Lungs: clear to auscultation bilaterally, no wheezes, rhonchi, or rales Cardiovascular: regular rate rhythm, S1, S2, no murmurs, rubs or gallops Abdomen:  Soft, non-tender, non-distended, normoactive bowel sounds, no HSM Extremities: warm and well perfused, no clubbing, cyanosis, or edema Skin: No rashes or lesions Neuro: Non-focal Breasts: right mastecomy site well healed, no nodularity, left breast, no masses, nodules or skin changes noted ECOG PERFORMANCE STATUS: 1 - Symptomatic but completely ambulatory      LABORATORY DATA: Lab Results  Component Value Date   WBC 4.9 08/26/2012   HGB 11.4* 08/26/2012   HCT 34.7* 08/26/2012   MCV 87.0 08/26/2012   PLT 276 08/26/2012      Chemistry      Component Value Date/Time   NA 138 08/12/2012 1047   NA 138 05/21/2012 0515   K 3.1* 08/12/2012 1047   K 4.2 05/21/2012 0515   CL 101 08/12/2012 1047   CL 104 05/21/2012 0515   CO2 26 08/12/2012 1047   CO2 25 05/21/2012 0515   BUN 7.2 08/12/2012 1047   BUN 10 05/21/2012 0515   CREATININE  0.9 08/12/2012 1047   CREATININE 0.89 05/21/2012 0515   CREATININE 0.85 03/10/2012 1544      Component Value Date/Time   CALCIUM 9.6 08/12/2012 1047   CALCIUM 9.1 05/21/2012 0515   ALKPHOS 90 08/12/2012 1047   ALKPHOS 47 04/06/2012 2306   AST 27 08/12/2012 1047   AST 21 04/06/2012 2306   ALT 38 08/12/2012 1047   ALT 16 04/06/2012 2306   BILITOT 0.24 08/12/2012 1047   BILITOT 0.2* 04/06/2012 2306       RADIOGRAPHIC STUDIES:  No results found.  ASSESSMENT: 57 year old female with  #1 stage II a multifocal invasive ductal carcinoma of the right breast status post mastectomy with axillary lymph node dissection. Postoperatively she is doing well.   #2 patient will be treated with adjuvant chemotherapy consisting of Taxotere Cytoxan every 3 weeks for a total of 6 cycles. Risks and benefits of treatment were discussed with the patient in detail. She started this on 06/24/2012 and is tolerating it relatively well.   #3 Hypokalemia  #4 Neuropathy  PLAN:   1. Doing well.  Labs look good.  Patient will proceed with chemotherapy.  She will return tomorrow for Neulasta.  2.  Ms. Sarratt potassium level is pending today, we will follow up with her if she needs to start taking potassium.    3.The numbness has resolved, she will continue with taking Super B complex daily.    4. She has restarted her arm exercises from the ABC class and her arm is improving.    5. She will return in one week for labs and an evaluation for chemotoxicities.   All questions were answered. The patient knows to call the clinic with any problems, questions or concerns. We can certainly see the patient much sooner if necessary.  I spent 25 minutes counseling the patient face to face. The total time spent in the appointment was 30 minutes.  Cherie Ouch Lyn Hollingshead, NP Medical Oncology [facility] Phone: [phone] 08/26/2012, 10:42 AM

## 2012-08-26 NOTE — Patient Instructions (Signed)
Doing well.  Proceed with chemotherapy.  Be careful with going back to work and overusing your right arm.    Lymphedema Lymphedema is a swelling caused by the abnormal collection of lymph under the skin. The lymph is fluid from the tissues in your body that travels in the lymphatic system. This system is part of the immune system that includes lymph nodes and vessels. The lymph vessels collect and carry the excess fluid, fats, proteins, and wastes from the tissues of the body to the bloodstream. This system also works to clean and remove bacteria and waste products from the body.  Lymphedema occurs when the lymphatic system is blocked. When the lymph vessels or lymph nodes are blocked or damaged, lymph does not drain properly. This causes abnormal build up of lymph. This leads to swelling in the arms or legs. Lymphedema cannot be cured by medicines. But the swelling can be reduced by physical methods. CAUSES  There are two types of lymphedema. Primary lymphedema is caused by the absence or abnormality of the lymph vessel at birth. It is also known as inherited lymphedema, which occurs rarely. Secondary or acquired lymphedema occurs when the lymph vessel is damaged or blocked. The causes of lymph vessel blockage are:   Skin infection like cellulites.  Infection by parasites (filariasis).  Injury.  Cancer.  Radiation therapy.  Formation of scar tissue.  Surgery. SYMPTOMS  The symptoms of lymphedema are:  Abnormal swelling of the arm or leg.  Heavy or tight feeling in your arm or leg.  Tight-fitting shoes or rings.  Redness of skin over the affected area.  Limited movement of the affected limb.  Some patients complain about sensitivity to touch and discomfort in the limb(s) affected. You may not have these symptoms immediately following injury. They usually appear within a few days or even years after injury. Inform your caregiver, if you have any of these symptoms. Early treatment  can avoid further problems.  DIAGNOSIS  First, your caregiver will inquire about any surgery you have had or medicines you are taking. He will then examine you. Your caregiver may order special imaging tests, such as:  Lymphoscintigraphy (a test in which a low dose of radioactive substance is injected to trace the flow of lymph through the lymph vessels).  MRI (imaging tests using magnetic fields).  Computed tomography (test using special cross-sectional X-rays).  Duplex ultrasound (test using high-frequency sound waves to show the vessels and the blood flow on a screen).  Lymphangiography (special X-ray taken after injecting a contrast dye into the lymph vessel). It is now rarely done. TREATMENT  Lymphedema can be treated in different ways. Your caregiver will decide the type of treatment depending on the cause. Treatment may include:  Exercise: Special exercises will help fluid move out easily from the affected part. This should be done as per your caregiver's advice.  Manual lymph drainage: Gentle massage of the affected limb makes the fluid to move out more freely.  Compression: Compression stockings or external pump apply pressure over the affected limb. This helps the fluid to move out from the arm or leg. Bandaging can also help to move the fluid out from the affected part. Your caregiver will decide the method that suits you the best.  Medicines: Your caregiver may prescribe antibiotics, if you have infection.  Surgery: Your caregiver may advise surgery for severe lymphedema. It is reserved for special cases when the patient has difficulty moving. Your surgeon may remove excess tissue from the  arm or leg. This will help to ease your movement. Physical therapy may have to be continued after surgery. HOME CARE INSTRUCTIONS  The area is very fragile and is predisposed to injury and infection.  Eat a healthy diet.  Exercise regularly as per advice.  Keep the affected area clean  and dry.  Use gloves while cooking or gardening.  Protect your skin from cuts.  Use electric razor to shave the affected area.  Keep affected limb elevated.  Do not wear tight clothes, shoes, or jewelry as it may cause the tissue to be strangled.  Do not use heat pads over the affected area.  Do not sit with cross legs.  Do not walk barefoot.  Do not carry weight on the affected arm.  Avoid having blood pressure checked on the affected limb. SEEK MEDICAL CARE IF:  You continue to have swelling in your limb. SEEK IMMEDIATE MEDICAL CARE IF:   You have high fever.  You have skin rash.  You have chills or sweats.  You have pain or redness.  You have a cut that does not heal. MAKE SURE YOU:   Understand these instructions.  Will watch your condition.  Will get help right away if you are not doing well or get worse. Document Released: 01/14/2007 Document Revised: 03/05/2012 Document Reviewed: 12/20/2008 [facility] Patient Information 2014 [place], [place].

## 2012-08-26 NOTE — Patient Instructions (Signed)
[facility] Discharge Instructions for Patients Receiving Chemotherapy  Today you received the following chemotherapy agents Taxotere/Cytoxan.  To help prevent nausea and vomiting after your treatment, we encourage you to take your nausea medication as prescribed.   If you develop nausea and vomiting that is not controlled by your nausea medication, call the clinic. If it is after clinic hours your family physician or the after hours number for the clinic or go to the Emergency Department.   BELOW ARE SYMPTOMS THAT SHOULD BE REPORTED IMMEDIATELY:  *FEVER GREATER THAN 100.5 F  *CHILLS WITH OR WITHOUT FEVER  NAUSEA AND VOMITING THAT IS NOT CONTROLLED WITH YOUR NAUSEA MEDICATION  *UNUSUAL SHORTNESS OF BREATH  *UNUSUAL BRUISING OR BLEEDING  TENDERNESS IN MOUTH AND THROAT WITH OR WITHOUT PRESENCE OF ULCERS  *URINARY PROBLEMS  *BOWEL PROBLEMS  UNUSUAL RASH Items with * indicate a potential emergency and should be followed up as soon as possible.  Feel free to call the clinic you have any questions or concerns. The clinic phone number is [phone].   I have been informed and understand all the instructions given to me. I know to contact the clinic, my physician, or go to the Emergency Department if any problems should occur. I do not have any questions at this time, but understand that I may call the clinic during office hours   should I have any questions or need assistance in obtaining follow up care.    __________________________________________  _____________  __________ Signature of Patient or Authorized Representative            Date                   Time    __________________________________________ Nurse's Signature    

## 2012-08-27 ENCOUNTER — Ambulatory Visit (HOSPITAL_BASED_OUTPATIENT_CLINIC_OR_DEPARTMENT_OTHER): Payer: Medicaid Other

## 2012-08-27 VITALS — BP 130/78 | HR 75 | Temp 98.0°F

## 2012-08-27 DIAGNOSIS — Z5189 Encounter for other specified aftercare: Secondary | ICD-10-CM

## 2012-08-27 DIAGNOSIS — C50419 Malignant neoplasm of upper-outer quadrant of unspecified female breast: Secondary | ICD-10-CM

## 2012-08-27 DIAGNOSIS — C50411 Malignant neoplasm of upper-outer quadrant of right female breast: Secondary | ICD-10-CM

## 2012-08-27 DIAGNOSIS — C773 Secondary and unspecified malignant neoplasm of axilla and upper limb lymph nodes: Secondary | ICD-10-CM

## 2012-08-27 MED ORDER — PEGFILGRASTIM INJECTION 6 MG/0.6ML
6.0000 mg | Freq: Once | SUBCUTANEOUS | Status: AC
  Administered 2012-08-27: 6 mg via SUBCUTANEOUS
  Filled 2012-08-27: qty 0.6

## 2012-09-02 ENCOUNTER — Encounter: Payer: Self-pay | Admitting: Adult Health

## 2012-09-02 ENCOUNTER — Other Ambulatory Visit (HOSPITAL_BASED_OUTPATIENT_CLINIC_OR_DEPARTMENT_OTHER): Payer: Medicaid Other | Admitting: Lab

## 2012-09-02 ENCOUNTER — Ambulatory Visit (HOSPITAL_BASED_OUTPATIENT_CLINIC_OR_DEPARTMENT_OTHER): Payer: Medicaid Other | Admitting: Adult Health

## 2012-09-02 VITALS — BP 122/72 | HR 94 | Temp 98.7°F | Resp 20 | Ht 63.0 in | Wt 181.3 lb

## 2012-09-02 DIAGNOSIS — C50419 Malignant neoplasm of upper-outer quadrant of unspecified female breast: Secondary | ICD-10-CM

## 2012-09-02 DIAGNOSIS — C50411 Malignant neoplasm of upper-outer quadrant of right female breast: Secondary | ICD-10-CM

## 2012-09-02 DIAGNOSIS — C773 Secondary and unspecified malignant neoplasm of axilla and upper limb lymph nodes: Secondary | ICD-10-CM

## 2012-09-02 DIAGNOSIS — G589 Mononeuropathy, unspecified: Secondary | ICD-10-CM

## 2012-09-02 DIAGNOSIS — E876 Hypokalemia: Secondary | ICD-10-CM

## 2012-09-02 LAB — COMPREHENSIVE METABOLIC PANEL (CC13)
ALT: 49 U/L (ref 0–55)
AST: 37 U/L — ABNORMAL HIGH (ref 5–34)
Albumin: 3.4 g/dL — ABNORMAL LOW (ref 3.5–5.0)
Alkaline Phosphatase: 92 U/L (ref 40–150)
BUN: 7.1 mg/dL (ref 7.0–26.0)
CO2: 26 mEq/L (ref 22–29)
Calcium: 9.6 mg/dL (ref 8.4–10.4)
Chloride: 105 mEq/L (ref 98–107)
Creatinine: 0.9 mg/dL (ref 0.6–1.1)
Glucose: 133 mg/dl — ABNORMAL HIGH (ref 70–99)
Potassium: 3.5 mEq/L (ref 3.5–5.1)
Sodium: 141 mEq/L (ref 136–145)
Total Bilirubin: 0.27 mg/dL (ref 0.20–1.20)
Total Protein: 6.7 g/dL (ref 6.4–8.3)

## 2012-09-02 LAB — CBC WITH DIFFERENTIAL/PLATELET
BASO%: 1 % (ref 0.0–2.0)
Basophils Absolute: 0.2 10*3/uL — ABNORMAL HIGH (ref 0.0–0.1)
EOS%: 0.8 % (ref 0.0–7.0)
Eosinophils Absolute: 0.2 10*3/uL (ref 0.0–0.5)
HCT: 32.1 % — ABNORMAL LOW (ref 34.8–46.6)
HGB: 10.6 g/dL — ABNORMAL LOW (ref 11.6–15.9)
LYMPH%: 9.9 % — ABNORMAL LOW (ref 14.0–49.7)
MCH: 28.6 pg (ref 25.1–34.0)
MCHC: 33 g/dL (ref 31.5–36.0)
MCV: 86.6 fL (ref 79.5–101.0)
MONO#: 2 10*3/uL — ABNORMAL HIGH (ref 0.1–0.9)
MONO%: 10.1 % (ref 0.0–14.0)
NEUT#: 15.6 10*3/uL — ABNORMAL HIGH (ref 1.5–6.5)
NEUT%: 78.2 % — ABNORMAL HIGH (ref 38.4–76.8)
Platelets: 240 10*3/uL (ref 145–400)
RBC: 3.7 10*6/uL (ref 3.70–5.45)
RDW: 17.3 % — ABNORMAL HIGH (ref 11.2–14.5)
WBC: 19.9 10*3/uL — ABNORMAL HIGH (ref 3.9–10.3)
lymph#: 2 10*3/uL (ref 0.9–3.3)

## 2012-09-02 NOTE — Patient Instructions (Signed)
Continue taking Super B complex for the numbness.  We will see you back in 2 weeks.  Please call us if you have any questions or concerns.

## 2012-09-02 NOTE — Progress Notes (Signed)
OFFICE PROGRESS NOTE  CCGildardo Cranker, DO [address] [place] [address]  DIAGNOSIS: 57 year old female with stage IIB right breast cancer, ER positive, PR positive, HER-2/neu negative.    PRIOR THERAPY:#1 .Who was seen in the multidisciplinary breast clinic for consultation. Patient noted a mass in the right breast about 4 months ago. She's had a mammogram every year on an annual basis. On her prior mammogram there were no suspicious findings. But because of the mass she presented for workup for the palpable mass. On mammogram there was noted to be an abnormality at the 11:00 position in the right breast corresponding to the palpable abnormality. There were also noted to be 2 additional suspicious masses within the right breast as well as abnormal right axillary lymph nodes. Patient went on to have an ultrasound-guided core needle biopsy of all 3 masses and the axillary lymph node. All 3 masses were positive for invasive ductal carcinoma. 2 primaries appear to be HER-2/neu negative ER positive PR +1 of the primaries demonstrated mucinous features with Ki-67 20%. The lymph node had a higher Ki-67 of 70%. Indicating there were 2 synchronous primaries. Patient underwent MRI of the breasts. Again 3 lesions were noted within the right breast. The larger mass was at the 10:00 position measuring 2.3 cm. An abnormal level I right axillary lymph node measures 3.0 cm. No abnormalities in the contralateral breast   #2 Patient is now status post right modified radical mastectomy. She was found to have multifocal disease. Tumor was ER PR positive HER-2/neu negative. She had one of 17 lymph nodes positive for metastatic disease. We discussed her pathology in detail today.   #3 patient is a good candidate for adjuvant chemotherapy. We discussed Taxotere Cytoxan every 3 weeks for a total of 6 cycles. She will need to radiation therapy and she will be referred back to radiation oncology after she  completes her chemotherapy.   CURRENT THERAPY: Taxotere Cytoxan cycle 4 day 8  INTERVAL HISTORY: Tasha Ortiz 57 y.o. female returns for f/u after her fourth cycle of adjuvant Taxotere Cytoxan.  She is very fatigued today.  Otherwise she is doing well and denies fevers, chills, nausea, vomiting, constipation, diarrhea, numbness, pain, or any other concerns.   MEDICAL HISTORY: Past Medical History  Diagnosis Date  . Hypertension   . Hypercholesteremia     taken off chol meds Dec 2012  . Acid reflux   . Breast cancer   . Heartburn     ALLERGIES:  has No Known Allergies.  MEDICATIONS:  Current Outpatient Prescriptions  Medication Sig Dispense Refill  . acetaminophen (TYLENOL) 325 MG tablet Take 2 tablets (650 mg total) by mouth every 6 (six) hours as needed for pain.  60 tablet  2  . B Complex-C (SUPER B COMPLEX PO) Take 1 tablet by mouth daily.      Marland Kitchen dexamethasone (DECADRON) 1 MG tablet Take 1 mg by mouth 2 (two) times daily with a meal.      . famotidine (PEPCID) 20 MG tablet Take 20 mg by mouth 2 (two) times daily.      Marland Kitchen lidocaine-prilocaine (EMLA) cream Apply topically as needed.  30 g  6  . lisinopril-hydrochlorothiazide (PRINZIDE,ZESTORETIC) 20-25 MG per tablet Take 1 tablet by mouth daily.      Marland Kitchen loratadine (CLARITIN) 10 MG tablet Take 1 tablet (10 mg total) by mouth daily.  30 tablet  2  . ondansetron (ZOFRAN) 8 MG tablet Take by mouth  every 8 (eight) hours as needed for nausea.      Marland Kitchen oxyCODONE-acetaminophen (PERCOCET/ROXICET) 5-325 MG per tablet Take 1-2 tablets by mouth every 4 (four) hours as needed.  40 tablet  0  . potassium chloride SA (K-DUR,KLOR-CON) 20 MEQ tablet Take 1 tablet (20 mEq total) by mouth daily.  30 tablet  0  . pravastatin (PRAVACHOL) 40 MG tablet Take 40 mg by mouth daily.      Marland Kitchen Prochlorperazine Maleate (COMPAZINE PO) Take by mouth.      . sodium chloride (OCEAN) 0.65 % nasal spray Place 4 sprays into the nose daily as needed. For congestion.       Marland Kitchen guaiFENesin (MUCINEX) 600 MG 12 hr tablet Take 1,200 mg by mouth 2 (two) times daily as needed. For cough       No current facility-administered medications for this visit.    SURGICAL HISTORY:  Past Surgical History  Procedure Laterality Date  . Cesarean section  25 years ago  . Tubal ligation    . Mastectomy Right   . Modified mastectomy Right 05/20/2012    Procedure: MODIFIED MASTECTOMY;  Surgeon: Mariella Saa, MD;  Location: [facility] OR;  Service: General;  Laterality: Right;  . Portacath placement Left 05/20/2012    Procedure: INSERTION PORT-A-CATH;  Surgeon: Mariella Saa, MD;  Location: [facility] OR;  Service: General;  Laterality: Left;  . Breast surgery Right 05/20/12    Rt br mastectomy    REVIEW OF SYSTEMS:  General: fatigue (+), night sweats (-), fever (-), pain (-) Lymph: palpable nodes (-) HEENT: vision changes (-), mucositis (-), gum bleeding (-), epistaxis (-) Cardiovascular: chest pain (-), palpitations (-) Pulmonary: shortness of breath (-), dyspnea on exertion (-), cough (-), hemoptysis (-) GI:  Early satiety (-), melena (-), dysphagia (-), nausea/vomiting (-), diarrhea (-) GU: dysuria (-), hematuria (-), incontinence (-) Musculoskeletal: joint swelling (-), joint pain (-), back pain (-) Neuro: weakness (-), numbness (-), headache (-), confusion (-) Skin: Rash (-), lesions (-), dryness (-) Psych: depression (-), suicidal/homicidal ideation (-), feeling of hopelessness (-)   PHYSICAL EXAMINATION: Blood pressure 122/72, pulse 94, temperature 98.7 F (37.1 C), temperature source Oral, resp. rate 20, height 5\' 3"  (1.6 m), weight 181 lb 4.8 oz (82.237 kg). Body mass index is 32.12 kg/(m^2). General: Patient is a well appearing female in no acute distress HEENT: PERRLA, sclerae anicteric no conjunctival pallor, MMM Neck: supple, no palpable adenopathy Lungs: clear to auscultation bilaterally, no wheezes, rhonchi, or rales Cardiovascular: regular rate rhythm, S1,  S2, no murmurs, rubs or gallops Abdomen: Soft, non-tender, non-distended, normoactive bowel sounds, no HSM Extremities: warm and well perfused, no clubbing, cyanosis, or edema Skin: No rashes or lesions Neuro: Non-focal Breasts: right mastecomy site well healed, no nodularity, left breast, no masses, nodules or skin changes noted ECOG PERFORMANCE STATUS: 1 - Symptomatic but completely ambulatory      LABORATORY DATA: Lab Results  Component Value Date   WBC 19.9* 09/02/2012   HGB 10.6* 09/02/2012   HCT 32.1* 09/02/2012   MCV 86.6 09/02/2012   PLT 240 09/02/2012      Chemistry      Component Value Date/Time   NA 141 09/02/2012 1429   NA 138 05/21/2012 0515   K 3.5 09/02/2012 1429   K 4.2 05/21/2012 0515   CL 105 09/02/2012 1429   CL 104 05/21/2012 0515   CO2 26 09/02/2012 1429   CO2 25 05/21/2012 0515   BUN 7.1 09/02/2012 1429  BUN 10 05/21/2012 0515   CREATININE 0.9 09/02/2012 1429   CREATININE 0.89 05/21/2012 0515   CREATININE 0.85 03/10/2012 1544      Component Value Date/Time   CALCIUM 9.6 09/02/2012 1429   CALCIUM 9.1 05/21/2012 0515   ALKPHOS 92 09/02/2012 1429   ALKPHOS 47 04/06/2012 2306   AST 37* 09/02/2012 1429   AST 21 04/06/2012 2306   ALT 49 09/02/2012 1429   ALT 16 04/06/2012 2306   BILITOT 0.27 09/02/2012 1429   BILITOT 0.2* 04/06/2012 2306       RADIOGRAPHIC STUDIES:  No results found.  ASSESSMENT: 57 year old female with  #1 stage II a multifocal invasive ductal carcinoma of the right breast status post mastectomy with axillary lymph node dissection. Postoperatively she is doing well.   #2 patient will be treated with adjuvant chemotherapy consisting of Taxotere Cytoxan every 3 weeks for a total of 6 cycles. Risks and benefits of treatment were discussed with the patient in detail. She started this on 06/24/2012 and is tolerating it relatively well.   #3 Hypokalemia  #4 Neuropathy  PLAN:   1. Doing well.  Labs look good.  Reassured her about her fatigue.  She will take it easy this  week, and I recommended walking 10 minutes once to twice per day to help improve fatigue.    2. Ms. Cannedy potassium level is pending today, we will follow up with her if she needs to start taking potassium.    3.The numbness has resolved, she will continue with taking Super B complex daily.    4. She will return in two week for her next cycle of chemotherapy.   All questions were answered. The patient knows to call the clinic with any problems, questions or concerns. We can certainly see the patient much sooner if necessary.  I spent 25 minutes counseling the patient face to face. The total time spent in the appointment was 30 minutes.  Cherie Ouch Lyn Hollingshead, NP Medical Oncology [place] Phone: [phone] 09/02/2012, 3:24 PM

## 2012-09-11 ENCOUNTER — Telehealth: Payer: Self-pay | Admitting: *Deleted

## 2012-09-11 NOTE — Telephone Encounter (Signed)
Patient walk in today at 928 with complaints of Right arm swelling. History and chart reviewed, discussed with desk nurse who states patient has just left a voicemail at 0913 this morning. Assessment of patient shows right arm with small amount of swelling, entire length of arm, cool to touch, no knots or areas of clot concerns. Patient states she has been more active at work this week, does note that she has also been eating higher sodium foods and not doing her ABC arm exercises as she should.  Per Candida Peeling, NP, we will go ahead and refer patient to the [facility] for further evaluation and treatment. Instructed patient to rest arm for the rest of today and keep elevated. Patient verbalized understanding. She questions if she needs to continue to work, instructed patient to monitor the next few days for decrease in the swelling and speak with Dr Welton Flakes about this at her office visit on 09/16/12. If job is too strenuous and continues to exacerbate the edema, some activities may need to be limited. The Lymphedema Clinic can also help patient with that decision making.

## 2012-09-16 ENCOUNTER — Encounter: Payer: Self-pay | Admitting: Oncology

## 2012-09-16 ENCOUNTER — Telehealth: Payer: Self-pay | Admitting: Oncology

## 2012-09-16 ENCOUNTER — Other Ambulatory Visit (HOSPITAL_BASED_OUTPATIENT_CLINIC_OR_DEPARTMENT_OTHER): Payer: Medicaid Other | Admitting: Lab

## 2012-09-16 ENCOUNTER — Ambulatory Visit (HOSPITAL_BASED_OUTPATIENT_CLINIC_OR_DEPARTMENT_OTHER): Payer: Medicaid Other | Admitting: Oncology

## 2012-09-16 ENCOUNTER — Ambulatory Visit (HOSPITAL_BASED_OUTPATIENT_CLINIC_OR_DEPARTMENT_OTHER): Payer: Medicaid Other

## 2012-09-16 VITALS — BP 112/73 | HR 80 | Temp 98.3°F | Resp 20 | Ht 63.0 in | Wt 190.5 lb

## 2012-09-16 DIAGNOSIS — C50411 Malignant neoplasm of upper-outer quadrant of right female breast: Secondary | ICD-10-CM

## 2012-09-16 DIAGNOSIS — G609 Hereditary and idiopathic neuropathy, unspecified: Secondary | ICD-10-CM

## 2012-09-16 DIAGNOSIS — C773 Secondary and unspecified malignant neoplasm of axilla and upper limb lymph nodes: Secondary | ICD-10-CM

## 2012-09-16 DIAGNOSIS — C50419 Malignant neoplasm of upper-outer quadrant of unspecified female breast: Secondary | ICD-10-CM

## 2012-09-16 DIAGNOSIS — Z5111 Encounter for antineoplastic chemotherapy: Secondary | ICD-10-CM

## 2012-09-16 DIAGNOSIS — I890 Lymphedema, not elsewhere classified: Secondary | ICD-10-CM

## 2012-09-16 LAB — COMPREHENSIVE METABOLIC PANEL (CC13)
ALT: 36 U/L (ref 0–55)
AST: 33 U/L (ref 5–34)
Albumin: 3.4 g/dL — ABNORMAL LOW (ref 3.5–5.0)
Alkaline Phosphatase: 52 U/L (ref 40–150)
BUN: 11.8 mg/dL (ref 7.0–26.0)
CO2: 24 mEq/L (ref 22–29)
Calcium: 9.5 mg/dL (ref 8.4–10.4)
Chloride: 107 mEq/L (ref 98–107)
Creatinine: 0.8 mg/dL (ref 0.6–1.1)
Glucose: 102 mg/dl — ABNORMAL HIGH (ref 70–99)
Potassium: 3.4 mEq/L — ABNORMAL LOW (ref 3.5–5.1)
Sodium: 141 mEq/L (ref 136–145)
Total Bilirubin: 0.29 mg/dL (ref 0.20–1.20)
Total Protein: 6.6 g/dL (ref 6.4–8.3)

## 2012-09-16 LAB — CBC WITH DIFFERENTIAL/PLATELET
BASO%: 0.5 % (ref 0.0–2.0)
Basophils Absolute: 0 10*3/uL (ref 0.0–0.1)
EOS%: 0.2 % (ref 0.0–7.0)
Eosinophils Absolute: 0 10*3/uL (ref 0.0–0.5)
HCT: 33.6 % — ABNORMAL LOW (ref 34.8–46.6)
HGB: 10.9 g/dL — ABNORMAL LOW (ref 11.6–15.9)
LYMPH%: 26.8 % (ref 14.0–49.7)
MCH: 29 pg (ref 25.1–34.0)
MCHC: 32.4 g/dL (ref 31.5–36.0)
MCV: 89.4 fL (ref 79.5–101.0)
MONO#: 0.5 10*3/uL (ref 0.1–0.9)
MONO%: 11.3 % (ref 0.0–14.0)
NEUT#: 2.5 10*3/uL (ref 1.5–6.5)
NEUT%: 61.2 % (ref 38.4–76.8)
Platelets: 286 10*3/uL (ref 145–400)
RBC: 3.76 10*6/uL (ref 3.70–5.45)
RDW: 17.5 % — ABNORMAL HIGH (ref 11.2–14.5)
WBC: 4.1 10*3/uL (ref 3.9–10.3)
lymph#: 1.1 10*3/uL (ref 0.9–3.3)
nRBC: 0 % (ref 0–0)

## 2012-09-16 MED ORDER — SODIUM CHLORIDE 0.9 % IV SOLN
75.0000 mg/m2 | Freq: Once | INTRAVENOUS | Status: AC
  Administered 2012-09-16: 140 mg via INTRAVENOUS
  Filled 2012-09-16: qty 14

## 2012-09-16 MED ORDER — ONDANSETRON 16 MG/50ML IVPB (CHCC)
16.0000 mg | Freq: Once | INTRAVENOUS | Status: AC
Start: 2012-09-16 — End: 2012-09-16
  Administered 2012-09-16: 16 mg via INTRAVENOUS

## 2012-09-16 MED ORDER — DEXAMETHASONE SODIUM PHOSPHATE 20 MG/5ML IJ SOLN
20.0000 mg | Freq: Once | INTRAMUSCULAR | Status: AC
  Administered 2012-09-16: 20 mg via INTRAVENOUS

## 2012-09-16 MED ORDER — SODIUM CHLORIDE 0.9 % IJ SOLN
10.0000 mL | INTRAMUSCULAR | Status: DC | PRN
  Administered 2012-09-16: 10 mL
  Filled 2012-09-16: qty 10

## 2012-09-16 MED ORDER — SODIUM CHLORIDE 0.9 % IV SOLN
600.0000 mg/m2 | Freq: Once | INTRAVENOUS | Status: AC
  Administered 2012-09-16: 1120 mg via INTRAVENOUS
  Filled 2012-09-16: qty 56

## 2012-09-16 MED ORDER — SODIUM CHLORIDE 0.9 % IV SOLN
Freq: Once | INTRAVENOUS | Status: AC
  Administered 2012-09-16: 10:00:00 via INTRAVENOUS

## 2012-09-16 MED ORDER — HEPARIN SOD (PORK) LOCK FLUSH 100 UNIT/ML IV SOLN
500.0000 [IU] | Freq: Once | INTRAVENOUS | Status: AC | PRN
  Administered 2012-09-16: 500 [IU]
  Filled 2012-09-16: qty 5

## 2012-09-16 NOTE — Patient Instructions (Addendum)
[facility] Discharge Instructions for Patients Receiving Chemotherapy  Today you received the following chemotherapy agents Taxotere and Cytoxan.  To help prevent nausea and vomiting after your treatment, we encourage you to take your nausea medication as directed  If you develop nausea and vomiting that is not controlled by your nausea medication, call the clinic.   BELOW ARE SYMPTOMS THAT SHOULD BE REPORTED IMMEDIATELY:  *FEVER GREATER THAN 100.5 F  *CHILLS WITH OR WITHOUT FEVER  NAUSEA AND VOMITING THAT IS NOT CONTROLLED WITH YOUR NAUSEA MEDICATION  *UNUSUAL SHORTNESS OF BREATH  *UNUSUAL BRUISING OR BLEEDING  TENDERNESS IN MOUTH AND THROAT WITH OR WITHOUT PRESENCE OF ULCERS  *URINARY PROBLEMS  *BOWEL PROBLEMS  UNUSUAL RASH Items with * indicate a potential emergency and should be followed up as soon as possible.  Feel free to call the clinic you have any questions or concerns. The clinic phone number is [phone].  

## 2012-09-16 NOTE — Patient Instructions (Addendum)
Proceed with cycle 5 of TC today  I have referred you to the lymphedema clinic  I have also notified Dr. Mitzi Hansen for follow up in the next 2 -3 weeks  We will see you back in 1 week with labs

## 2012-09-16 NOTE — Telephone Encounter (Signed)
, °

## 2012-09-16 NOTE — Progress Notes (Signed)
OFFICE PROGRESS NOTE  CC  Tasha Cranker, DO [address] [place] [address]  DIAGNOSIS: 57 year old female with stage IIB right breast cancer, ER positive, PR positive, HER-2/neu negative.    PRIOR THERAPY:#1 .Who was seen in the multidisciplinary breast clinic for consultation. Patient noted a mass in the right breast about 4 months ago. She's had a mammogram every year on an annual basis. On her prior mammogram there were no suspicious findings. But because of the mass she presented for workup for the palpable mass. On mammogram there was noted to be an abnormality at the 11:00 position in the right breast corresponding to the palpable abnormality. There were also noted to be 2 additional suspicious masses within the right breast as well as abnormal right axillary lymph nodes. Patient went on to have an ultrasound-guided core needle biopsy of all 3 masses and the axillary lymph node. All 3 masses were positive for invasive ductal carcinoma. 2 primaries appear to be HER-2/neu negative ER positive PR +1 of the primaries demonstrated mucinous features with Ki-67 20%. The lymph node had a higher Ki-67 of 70%. Indicating there were 2 synchronous primaries. Patient underwent MRI of the breasts. Again 3 lesions were noted within the right breast. The larger mass was at the 10:00 position measuring 2.3 cm. An abnormal level I right axillary lymph node measures 3.0 cm. No abnormalities in the contralateral breast   #2 Patient is now status post right modified radical mastectomy. She was found to have multifocal disease. Tumor was ER PR positive HER-2/neu negative. She had one of 17 lymph nodes positive for metastatic disease. We discussed her pathology in detail today.   #3 patient is a good candidate for adjuvant chemotherapy. We discussed Taxotere Cytoxan every 3 weeks for a total of 6 cycles. She will need to radiation therapy and she will be referred back to radiation oncology after she  completes her chemotherapy.   CURRENT THERAPY: Taxotere Cytoxan cycle 5 day 1  INTERVAL HISTORY: Tasha Ortiz 57 y.o. female returns for f/u prior to cycle 5 of chemotherapy. She overall ias doing well. No nausea, vomiting, no fevers or chills or diarrhea, she does experience aches and pains after the neulasta injection.She has noticed some lymphedema in the right arm and has a sleeve ordered. Remainder of the 10 point of systems is negative.  MEDICAL HISTORY: Past Medical History  Diagnosis Date  . Hypertension   . Hypercholesteremia     taken off chol meds Dec 2012  . Acid reflux   . Breast cancer   . Heartburn     ALLERGIES:  has No Known Allergies.  MEDICATIONS:  Current Outpatient Prescriptions  Medication Sig Dispense Refill  . acetaminophen (TYLENOL) 325 MG tablet Take 2 tablets (650 mg total) by mouth every 6 (six) hours as needed for pain.  60 tablet  2  . B Complex-C (SUPER B COMPLEX PO) Take 1 tablet by mouth daily.      Marland Kitchen lidocaine-prilocaine (EMLA) cream Apply topically as needed.  30 g  6  . lisinopril-hydrochlorothiazide (PRINZIDE,ZESTORETIC) 20-25 MG per tablet Take 1 tablet by mouth daily.      Marland Kitchen loratadine (CLARITIN) 10 MG tablet Take 1 tablet (10 mg total) by mouth daily.  30 tablet  2  . ondansetron (ZOFRAN) 8 MG tablet Take by mouth every 8 (eight) hours as needed for nausea.      . pravastatin (PRAVACHOL) 40 MG tablet Take 40 mg by mouth daily.      Marland Kitchen  sodium chloride (OCEAN) 0.65 % nasal spray Place 4 sprays into the nose daily as needed. For congestion.      Marland Kitchen dexamethasone (DECADRON) 1 MG tablet Take 1 mg by mouth 2 (two) times daily with a meal.      . famotidine (PEPCID) 20 MG tablet Take 20 mg by mouth 2 (two) times daily.      Marland Kitchen guaiFENesin (MUCINEX) 600 MG 12 hr tablet Take 1,200 mg by mouth 2 (two) times daily as needed. For cough      . oxyCODONE-acetaminophen (PERCOCET/ROXICET) 5-325 MG per tablet Take 1-2 tablets by mouth every 4 (four) hours  as needed.  40 tablet  0  . potassium chloride SA (K-DUR,KLOR-CON) 20 MEQ tablet Take 1 tablet (20 mEq total) by mouth daily.  30 tablet  0  . Prochlorperazine Maleate (COMPAZINE PO) Take by mouth.       No current facility-administered medications for this visit.    SURGICAL HISTORY:  Past Surgical History  Procedure Laterality Date  . Cesarean section  25 years ago  . Tubal ligation    . Mastectomy Right   . Modified mastectomy Right 05/20/2012    Procedure: MODIFIED MASTECTOMY;  Surgeon: Mariella Saa, MD;  Location: [facility] OR;  Service: General;  Laterality: Right;  . Portacath placement Left 05/20/2012    Procedure: INSERTION PORT-A-CATH;  Surgeon: Mariella Saa, MD;  Location: [facility] OR;  Service: General;  Laterality: Left;  . Breast surgery Right 05/20/12    Rt br mastectomy    REVIEW OF SYSTEMS:  General: fatigue (+), night sweats (-), fever (-), pain (-) Lymph: palpable nodes (-) HEENT: vision changes (-), mucositis (-), gum bleeding (-), epistaxis (-) Cardiovascular: chest pain (-), palpitations (-) Pulmonary: shortness of breath (-), dyspnea on exertion (-), cough (-), hemoptysis (-) GI:  Early satiety (-), melena (-), dysphagia (-), nausea/vomiting (-), diarrhea (-) GU: dysuria (-), hematuria (-), incontinence (-) Musculoskeletal: joint swelling (-), joint pain (-), back pain (-) Neuro: weakness (-), numbness (-), headache (-), confusion (-) Skin: Rash (-), lesions (-), dryness (-) Psych: depression (-), suicidal/homicidal ideation (-), feeling of hopelessness (-)   PHYSICAL EXAMINATION: Blood pressure 112/73, pulse 80, temperature 98.3 F (36.8 C), temperature source Oral, resp. rate 20, height 5\' 3"  (1.6 m), weight 190 lb 8 oz (86.41 kg). Body mass index is 33.75 kg/(m^2). General: Patient is a well appearing female in no acute distress HEENT: PERRLA, sclerae anicteric no conjunctival pallor, MMM Neck: supple, no palpable adenopathy Lungs: clear to auscultation  bilaterally, no wheezes, rhonchi, or rales Cardiovascular: regular rate rhythm, S1, S2, no murmurs, rubs or gallops Abdomen: Soft, non-tender, non-distended, normoactive bowel sounds, no HSM Extremities: warm and well perfused, no clubbing, cyanosis, or edema Skin: No rashes or lesions Neuro: Non-focal Breasts: right mastecomy site well healed, no nodularity, left breast, no masses, nodules or skin changes noted ECOG PERFORMANCE STATUS: 1 - Symptomatic but completely ambulatory      LABORATORY DATA: Lab Results  Component Value Date   WBC 4.1 09/16/2012   HGB 10.9* 09/16/2012   HCT 33.6* 09/16/2012   MCV 89.4 09/16/2012   PLT 286 09/16/2012      Chemistry      Component Value Date/Time   NA 141 09/02/2012 1429   NA 138 05/21/2012 0515   K 3.5 09/02/2012 1429   K 4.2 05/21/2012 0515   CL 105 09/02/2012 1429   CL 104 05/21/2012 0515   CO2 26 09/02/2012 1429  CO2 25 05/21/2012 0515   BUN 7.1 09/02/2012 1429   BUN 10 05/21/2012 0515   CREATININE 0.9 09/02/2012 1429   CREATININE 0.89 05/21/2012 0515   CREATININE 0.85 03/10/2012 1544      Component Value Date/Time   CALCIUM 9.6 09/02/2012 1429   CALCIUM 9.1 05/21/2012 0515   ALKPHOS 92 09/02/2012 1429   ALKPHOS 47 04/06/2012 2306   AST 37* 09/02/2012 1429   AST 21 04/06/2012 2306   ALT 49 09/02/2012 1429   ALT 16 04/06/2012 2306   BILITOT 0.27 09/02/2012 1429   BILITOT 0.2* 04/06/2012 2306       RADIOGRAPHIC STUDIES:  No results found.  ASSESSMENT: 57 year old female with  #1 stage II a multifocal invasive ductal carcinoma of the right breast status post mastectomy with axillary lymph node dissection. Postoperatively she is doing well.   #2 patient will be treated with adjuvant chemotherapy consisting of Taxotere Cytoxan every 3 weeks for a total of 6 cycles. Risks and benefits of treatment were discussed with the patient in detail. She started this on 06/24/2012 and is tolerating it relatively well.   #3 Hypokalemia  #4 Neuropathy  PLAN:  #1  proceed with cycle #5 of TC today  #2 return in 1 week for follow up  #3 Lymphedema wear sleeve when available to you  All questions were answered. The patient knows to call the clinic with any problems, questions or concerns. We can certainly see the patient much sooner if necessary.  I spent 25 minutes counseling the patient face to face. The total time spent in the appointment was 30 minutes.  Drue Second, MD Medical/Oncology [facility] [phone] (beeper) [phone] (Office)  09/16/2012, 9:43 AM

## 2012-09-17 ENCOUNTER — Ambulatory Visit (HOSPITAL_BASED_OUTPATIENT_CLINIC_OR_DEPARTMENT_OTHER): Payer: Medicaid Other

## 2012-09-17 VITALS — BP 133/55 | HR 91 | Temp 98.4°F

## 2012-09-17 DIAGNOSIS — C50411 Malignant neoplasm of upper-outer quadrant of right female breast: Secondary | ICD-10-CM

## 2012-09-17 DIAGNOSIS — C50419 Malignant neoplasm of upper-outer quadrant of unspecified female breast: Secondary | ICD-10-CM

## 2012-09-17 MED ORDER — PEGFILGRASTIM INJECTION 6 MG/0.6ML
6.0000 mg | Freq: Once | SUBCUTANEOUS | Status: AC
  Administered 2012-09-17: 6 mg via SUBCUTANEOUS
  Filled 2012-09-17: qty 0.6

## 2012-09-23 ENCOUNTER — Other Ambulatory Visit (HOSPITAL_BASED_OUTPATIENT_CLINIC_OR_DEPARTMENT_OTHER): Payer: Medicaid Other | Admitting: Lab

## 2012-09-23 ENCOUNTER — Ambulatory Visit (HOSPITAL_BASED_OUTPATIENT_CLINIC_OR_DEPARTMENT_OTHER): Payer: Medicaid Other | Admitting: Adult Health

## 2012-09-23 ENCOUNTER — Encounter: Payer: Self-pay | Admitting: Adult Health

## 2012-09-23 VITALS — BP 124/73 | HR 94 | Temp 98.8°F | Resp 20 | Ht 63.0 in | Wt 185.6 lb

## 2012-09-23 DIAGNOSIS — C50411 Malignant neoplasm of upper-outer quadrant of right female breast: Secondary | ICD-10-CM

## 2012-09-23 DIAGNOSIS — C50419 Malignant neoplasm of upper-outer quadrant of unspecified female breast: Secondary | ICD-10-CM

## 2012-09-23 LAB — COMPREHENSIVE METABOLIC PANEL (CC13)
ALT: 23 U/L (ref 0–55)
AST: 20 U/L (ref 5–34)
Albumin: 3.5 g/dL (ref 3.5–5.0)
Alkaline Phosphatase: 82 U/L (ref 40–150)
BUN: 8.4 mg/dL (ref 7.0–26.0)
CO2: 25 mEq/L (ref 22–29)
Calcium: 9.6 mg/dL (ref 8.4–10.4)
Chloride: 103 mEq/L (ref 98–107)
Creatinine: 0.9 mg/dL (ref 0.6–1.1)
Glucose: 140 mg/dl — ABNORMAL HIGH (ref 70–99)
Potassium: 3.2 mEq/L — ABNORMAL LOW (ref 3.5–5.1)
Sodium: 139 mEq/L (ref 136–145)
Total Bilirubin: 0.33 mg/dL (ref 0.20–1.20)
Total Protein: 6.5 g/dL (ref 6.4–8.3)

## 2012-09-23 LAB — CBC WITH DIFFERENTIAL/PLATELET
BASO%: 0.5 % (ref 0.0–2.0)
Basophils Absolute: 0.1 10*3/uL (ref 0.0–0.1)
EOS%: 0.4 % (ref 0.0–7.0)
Eosinophils Absolute: 0.1 10*3/uL (ref 0.0–0.5)
HCT: 30.9 % — ABNORMAL LOW (ref 34.8–46.6)
HGB: 10.5 g/dL — ABNORMAL LOW (ref 11.6–15.9)
LYMPH%: 7.1 % — ABNORMAL LOW (ref 14.0–49.7)
MCH: 29.7 pg (ref 25.1–34.0)
MCHC: 33.8 g/dL (ref 31.5–36.0)
MCV: 87.8 fL (ref 79.5–101.0)
MONO#: 1.9 10*3/uL — ABNORMAL HIGH (ref 0.1–0.9)
MONO%: 8.1 % (ref 0.0–14.0)
NEUT#: 19.4 10*3/uL — ABNORMAL HIGH (ref 1.5–6.5)
NEUT%: 83.9 % — ABNORMAL HIGH (ref 38.4–76.8)
Platelets: 173 10*3/uL (ref 145–400)
RBC: 3.52 10*6/uL — ABNORMAL LOW (ref 3.70–5.45)
RDW: 17.6 % — ABNORMAL HIGH (ref 11.2–14.5)
WBC: 23.1 10*3/uL — ABNORMAL HIGH (ref 3.9–10.3)
lymph#: 1.6 10*3/uL (ref 0.9–3.3)

## 2012-09-23 MED ORDER — POTASSIUM CHLORIDE CRYS ER 20 MEQ PO TBCR: 20.0000 meq | EXTENDED_RELEASE_TABLET | Freq: Every day | ORAL | Status: DC

## 2012-09-23 MED ORDER — DEXAMETHASONE 4 MG PO TABS: 8.0000 mg | ORAL_TABLET | ORAL | Status: DC

## 2012-09-23 NOTE — Progress Notes (Signed)
OFFICE PROGRESS NOTE  CCGildardo Cranker, DO [address] [place] [address]  DIAGNOSIS: 57 year old female with stage IIB right breast cancer, ER positive, PR positive, HER-2/neu negative.    PRIOR THERAPY:#1 .Who was seen in the multidisciplinary breast clinic for consultation. Patient noted a mass in the right breast about 4 months ago. She's had a mammogram every year on an annual basis. On her prior mammogram there were no suspicious findings. But because of the mass she presented for workup for the palpable mass. On mammogram there was noted to be an abnormality at the 11:00 position in the right breast corresponding to the palpable abnormality. There were also noted to be 2 additional suspicious masses within the right breast as well as abnormal right axillary lymph nodes. Patient went on to have an ultrasound-guided core needle biopsy of all 3 masses and the axillary lymph node. All 3 masses were positive for invasive ductal carcinoma. 2 primaries appear to be HER-2/neu negative ER positive PR +1 of the primaries demonstrated mucinous features with Ki-67 20%. The lymph node had a higher Ki-67 of 70%. Indicating there were 2 synchronous primaries. Patient underwent MRI of the breasts. Again 3 lesions were noted within the right breast. The larger mass was at the 10:00 position measuring 2.3 cm. An abnormal level I right axillary lymph node measures 3.0 cm. No abnormalities in the contralateral breast   #2 Patient is now status post right modified radical mastectomy. She was found to have multifocal disease. Tumor was ER PR positive HER-2/neu negative. She had one of 17 lymph nodes positive for metastatic disease. We discussed her pathology in detail today.   #3 patient is a good candidate for adjuvant chemotherapy. We discussed Taxotere Cytoxan every 3 weeks for a total of 6 cycles. She will need to radiation therapy and she will be referred back to radiation oncology after she  completes her chemotherapy.   CURRENT THERAPY: Taxotere Cytoxan cycle 5 day 8  INTERVAL HISTORY: Tasha Ortiz 57 y.o. female returns for f/u following her fifth cycle of Taxotere/Cytoxan today.  She is doing well.  She remains fatigued, and does have occasional intermittent nausea that is relieved with her antiemetics.  She did have mild diarrhea that is now resolving.  She otherwise is well and a 10 point ROS is negative.    MEDICAL HISTORY: Past Medical History  Diagnosis Date  . Hypertension   . Hypercholesteremia     taken off chol meds Dec 2012  . Acid reflux   . Breast cancer   . Heartburn     ALLERGIES:  has No Known Allergies.  MEDICATIONS:  Current Outpatient Prescriptions  Medication Sig Dispense Refill  . acetaminophen (TYLENOL) 325 MG tablet Take 2 tablets (650 mg total) by mouth every 6 (six) hours as needed for pain.  60 tablet  2  . B Complex-C (SUPER B COMPLEX PO) Take 1 tablet by mouth daily.      Marland Kitchen dexamethasone (DECADRON) 1 MG tablet Take 1 mg by mouth 2 (two) times daily with a meal.      . famotidine (PEPCID) 20 MG tablet Take 20 mg by mouth 2 (two) times daily.      Marland Kitchen guaiFENesin (MUCINEX) 600 MG 12 hr tablet Take 1,200 mg by mouth 2 (two) times daily as needed. For cough      . lidocaine-prilocaine (EMLA) cream Apply topically as needed.  30 g  6  . lisinopril-hydrochlorothiazide (PRINZIDE,ZESTORETIC) 20-25 MG  per tablet Take 1 tablet by mouth daily.      Marland Kitchen loratadine (CLARITIN) 10 MG tablet Take 1 tablet (10 mg total) by mouth daily.  30 tablet  2  . ondansetron (ZOFRAN) 8 MG tablet Take by mouth every 8 (eight) hours as needed for nausea.      Marland Kitchen oxyCODONE-acetaminophen (PERCOCET/ROXICET) 5-325 MG per tablet Take 1-2 tablets by mouth every 4 (four) hours as needed.  40 tablet  0  . potassium chloride SA (K-DUR,KLOR-CON) 20 MEQ tablet Take 1 tablet (20 mEq total) by mouth daily.  30 tablet  0  . pravastatin (PRAVACHOL) 40 MG tablet Take 40 mg by mouth  daily.      Marland Kitchen Prochlorperazine Maleate (COMPAZINE PO) Take by mouth.      . sodium chloride (OCEAN) 0.65 % nasal spray Place 4 sprays into the nose daily as needed. For congestion.       No current facility-administered medications for this visit.    SURGICAL HISTORY:  Past Surgical History  Procedure Laterality Date  . Cesarean section  25 years ago  . Tubal ligation    . Mastectomy Right   . Modified mastectomy Right 05/20/2012    Procedure: MODIFIED MASTECTOMY;  Surgeon: Mariella Saa, MD;  Location: [facility] OR;  Service: General;  Laterality: Right;  . Portacath placement Left 05/20/2012    Procedure: INSERTION PORT-A-CATH;  Surgeon: Mariella Saa, MD;  Location: [facility] OR;  Service: General;  Laterality: Left;  . Breast surgery Right 05/20/12    Rt br mastectomy    REVIEW OF SYSTEMS:  General: fatigue (+), night sweats (-), fever (-), pain (-) Lymph: palpable nodes (-) HEENT: vision changes (-), mucositis (-), gum bleeding (-), epistaxis (-) Cardiovascular: chest pain (-), palpitations (-) Pulmonary: shortness of breath (-), dyspnea on exertion (-), cough (-), hemoptysis (-) GI:  Early satiety (-), melena (-), dysphagia (-), nausea/vomiting (+), diarrhea (-) GU: dysuria (-), hematuria (-), incontinence (-) Musculoskeletal: joint swelling (-), joint pain (-), back pain (-) Neuro: weakness (-), numbness (-), headache (-), confusion (-) Skin: Rash (-), lesions (-), dryness (-) Psych: depression (-), suicidal/homicidal ideation (-), feeling of hopelessness (-)   PHYSICAL EXAMINATION: Blood pressure 124/73, pulse 94, temperature 98.8 F (37.1 C), temperature source Oral, resp. rate 20, height 5\' 3"  (1.6 m), weight 185 lb 9.6 oz (84.188 kg). Body mass index is 32.89 kg/(m^2). General: Patient is a well appearing female in no acute distress HEENT: PERRLA, sclerae anicteric no conjunctival pallor, MMM Neck: supple, no palpable adenopathy Lungs: clear to auscultation bilaterally,  no wheezes, rhonchi, or rales Cardiovascular: regular rate rhythm, S1, S2, no murmurs, rubs or gallops Abdomen: Soft, non-tender, non-distended, normoactive bowel sounds, no HSM Extremities: warm and well perfused, no clubbing, cyanosis, or edema Skin: No rashes or lesions Neuro: Non-focal Breasts: right mastecomy site well healed, no nodularity, left breast, no masses, nodules or skin changes noted ECOG PERFORMANCE STATUS: 1 - Symptomatic but completely ambulatory      LABORATORY DATA: Lab Results  Component Value Date   WBC 23.1* 09/23/2012   HGB 10.5* 09/23/2012   HCT 30.9* 09/23/2012   MCV 87.8 09/23/2012   PLT 173 09/23/2012      Chemistry      Component Value Date/Time   NA 139 09/23/2012 1040   NA 138 05/21/2012 0515   K 3.2* 09/23/2012 1040   K 4.2 05/21/2012 0515   CL 103 09/23/2012 1040   CL 104 05/21/2012 0515   CO2  25 09/23/2012 1040   CO2 25 05/21/2012 0515   BUN 8.4 09/23/2012 1040   BUN 10 05/21/2012 0515   CREATININE 0.9 09/23/2012 1040   CREATININE 0.89 05/21/2012 0515   CREATININE 0.85 03/10/2012 1544      Component Value Date/Time   CALCIUM 9.6 09/23/2012 1040   CALCIUM 9.1 05/21/2012 0515   ALKPHOS 82 09/23/2012 1040   ALKPHOS 47 04/06/2012 2306   AST 20 09/23/2012 1040   AST 21 04/06/2012 2306   ALT 23 09/23/2012 1040   ALT 16 04/06/2012 2306   BILITOT 0.33 09/23/2012 1040   BILITOT 0.2* 04/06/2012 2306       RADIOGRAPHIC STUDIES:  No results found.  ASSESSMENT: 57 year old female with  #1 stage II a multifocal invasive ductal carcinoma of the right breast status post mastectomy with axillary lymph node dissection. Postoperatively she is doing well.   #2 patient will be treated with adjuvant chemotherapy consisting of Taxotere Cytoxan every 3 weeks for a total of 6 cycles. Risks and benefits of treatment were discussed with the patient in detail. She started this on 06/24/2012 and is tolerating it relatively well.   #3 Hypokalemia  PLAN:  #1 Doing well.  Labs are  stable after chemotherapy.  I refilled her Dexamethasone today.  She will get fitted for a lymphedema sleeve on 09/30/12.   #2 return in 2 weeks for cycle six of Taxotere/Cytoxan.    #3 Her potassium is 3.2 today.  I have prescribed Kdur, daily until her next appt.    All questions were answered. The patient knows to call the clinic with any problems, questions or concerns. We can certainly see the patient much sooner if necessary.  I spent 25 minutes counseling the patient face to face. The total time spent in the appointment was 30 minutes.  Cherie Ouch Lyn Hollingshead, NP Medical Oncology [facility] Phone: [phone] 09/23/2012, 11:24 AM

## 2012-09-23 NOTE — Patient Instructions (Addendum)
Doing well.  Labs are stable.  We will see you back in 2 weeks for cycle 6 of treatment.  Take one Kdur tablet daily until your next appointment.  Please call us if you have any questions or concerns.    Hypokalemia Hypokalemia means a low potassium level in the blood.Potassium is an electrolyte that helps regulate the amount of fluid in the body. It also stimulates muscle contraction and maintains a stable acid-base balance.Most of the body's potassium is inside of cells, and only a very small amount is in the blood. Because the amount in the blood is so small, minor changes can have big effects. PREPARATION FOR TEST Testing for potassium requires taking a blood sample taken by needle from a vein in the arm. The skin is cleaned thoroughly before the sample is drawn. There is no other special preparation needed. NORMAL VALUES Potassium levels below 3.5 mEq/L are abnormally low. Levels above 5.1 mEq/L are abnormally high. Ranges for normal findings may vary among different laboratories and hospitals. You should always check with your doctor after having lab work or other tests done to discuss the meaning of your test results and whether your values are considered within normal limits. MEANING OF TEST  Your caregiver will go over the test results with you and discuss the importance and meaning of your results, as well as treatment options and the need for additional tests, if necessary. A potassium level is frequently part of a routine medical exam. It is usually included as part of a whole "panel" of tests for several blood salts (such as Sodium and Chloride). It may be done as part of follow-up when a low potassium level was found in the past or other blood salts are suspected of being out of balance. A low potassium level might be suspected if you have one or more of the following:  Symptoms of weakness.  Abnormal heart rhythms.  High blood pressure and are taking medication to control this,  especially water pills (diuretics).  Kidney disease that can affect your potassium level .  Diabetes requiring the use of insulin. The potassium may fall after taking insulin, especially if the diabetes had been out of control for a while.  A condition requiring the use of cortisone-type medication or certain types of antibiotics.  Vomiting and/or diarrhea for more than a day or two.  A stomach or intestinal condition that may not permit appropriate absorption of potassium.  Fainting episodes.  Mental confusion. OBTAINING TEST RESULTS It is your responsibility to obtain your test results. Ask the lab or department performing the test when and how you will get your results.  Please contact your caregiver directly if you have not received the results within one week. At that time, ask if there is anything different or new you should be doing in relation to the results. TREATMENT Hypokalemia can be treated with potassium supplements taken by mouth and/or adjustments in your current medications. A diet high in potassium is also helpful. Foods with high potassium content are:  Peas, lentils, lima beans, nuts, and dried fruit.  Whole grain and bran cereals and breads.  Fresh fruit, vegetables (bananas, cantaloupe, grapefruit, oranges, tomatoes, honeydew melons, potatoes).  Orange and tomato juices.  Meats. If potassium supplement has been prescribed for you today or your medications have been adjusted, see your personal caregiver in time02 for a re-check. SEEK MEDICAL CARE IF:  There is a feeling of worsening weakness.  You experience repeated chest palpitations.  You are diabetic and having difficulty keeping your blood sugars in the normal range.  You are experiencing vomiting and/or diarrhea.  You are having difficulty with any of your regular medications. SEEK IMMEDIATE MEDICAL CARE IF:  You experience chest pain, shortness of breath, or episodes of dizziness.  You have  been having vomiting or diarrhea for more than 2 days.  You have a fainting episode. MAKE SURE YOU:   Understand these instructions.  Will watch your condition.  Will get help right away if you are not doing well or get worse. Document Released: 03/19/2005 Document Revised: 06/11/2011 Document Reviewed: 02/28/2008 [facility] Patient Information 2014 [place], [place].

## 2012-09-30 ENCOUNTER — Ambulatory Visit: Payer: No Typology Code available for payment source | Attending: Oncology | Admitting: Physical Therapy

## 2012-09-30 DIAGNOSIS — I890 Lymphedema, not elsewhere classified: Secondary | ICD-10-CM | POA: Insufficient documentation

## 2012-09-30 DIAGNOSIS — M24519 Contracture, unspecified shoulder: Secondary | ICD-10-CM | POA: Insufficient documentation

## 2012-09-30 DIAGNOSIS — IMO0001 Reserved for inherently not codable concepts without codable children: Secondary | ICD-10-CM | POA: Insufficient documentation

## 2012-10-06 ENCOUNTER — Ambulatory Visit: Payer: No Typology Code available for payment source | Admitting: Physical Therapy

## 2012-10-07 ENCOUNTER — Ambulatory Visit (HOSPITAL_BASED_OUTPATIENT_CLINIC_OR_DEPARTMENT_OTHER): Payer: Medicaid Other

## 2012-10-07 ENCOUNTER — Encounter: Payer: Self-pay | Admitting: Oncology

## 2012-10-07 ENCOUNTER — Ambulatory Visit (HOSPITAL_BASED_OUTPATIENT_CLINIC_OR_DEPARTMENT_OTHER): Payer: Medicaid Other | Admitting: Oncology

## 2012-10-07 ENCOUNTER — Other Ambulatory Visit (HOSPITAL_BASED_OUTPATIENT_CLINIC_OR_DEPARTMENT_OTHER): Payer: Medicaid Other | Admitting: Lab

## 2012-10-07 VITALS — BP 103/59 | HR 70 | Temp 97.8°F | Resp 16

## 2012-10-07 VITALS — BP 102/69 | HR 90 | Temp 98.4°F | Resp 20 | Ht 63.0 in | Wt 189.9 lb

## 2012-10-07 DIAGNOSIS — C50419 Malignant neoplasm of upper-outer quadrant of unspecified female breast: Secondary | ICD-10-CM

## 2012-10-07 DIAGNOSIS — Z5111 Encounter for antineoplastic chemotherapy: Secondary | ICD-10-CM

## 2012-10-07 DIAGNOSIS — C50411 Malignant neoplasm of upper-outer quadrant of right female breast: Secondary | ICD-10-CM

## 2012-10-07 DIAGNOSIS — C773 Secondary and unspecified malignant neoplasm of axilla and upper limb lymph nodes: Secondary | ICD-10-CM

## 2012-10-07 DIAGNOSIS — Z170 Estrogen receptor positive status [ER+]: Secondary | ICD-10-CM

## 2012-10-07 LAB — CBC WITH DIFFERENTIAL/PLATELET
BASO%: 0.6 % (ref 0.0–2.0)
Basophils Absolute: 0 10*3/uL (ref 0.0–0.1)
EOS%: 0.3 % (ref 0.0–7.0)
Eosinophils Absolute: 0 10*3/uL (ref 0.0–0.5)
HCT: 33.1 % — ABNORMAL LOW (ref 34.8–46.6)
HGB: 10.6 g/dL — ABNORMAL LOW (ref 11.6–15.9)
LYMPH%: 27.8 % (ref 14.0–49.7)
MCH: 29.2 pg (ref 25.1–34.0)
MCHC: 32 g/dL (ref 31.5–36.0)
MCV: 91.2 fL (ref 79.5–101.0)
MONO#: 0.5 10*3/uL (ref 0.1–0.9)
MONO%: 14.9 % — ABNORMAL HIGH (ref 0.0–14.0)
NEUT#: 1.9 10*3/uL (ref 1.5–6.5)
NEUT%: 56.4 % (ref 38.4–76.8)
Platelets: 235 10*3/uL (ref 145–400)
RBC: 3.63 10*6/uL — ABNORMAL LOW (ref 3.70–5.45)
RDW: 16.8 % — ABNORMAL HIGH (ref 11.2–14.5)
WBC: 3.4 10*3/uL — ABNORMAL LOW (ref 3.9–10.3)
lymph#: 1 10*3/uL (ref 0.9–3.3)
nRBC: 0 % (ref 0–0)

## 2012-10-07 LAB — COMPREHENSIVE METABOLIC PANEL (CC13)
ALT: 25 U/L (ref 0–55)
AST: 23 U/L (ref 5–34)
Albumin: 3.3 g/dL — ABNORMAL LOW (ref 3.5–5.0)
Alkaline Phosphatase: 40 U/L (ref 40–150)
BUN: 12.7 mg/dL (ref 7.0–26.0)
CO2: 27 mEq/L (ref 22–29)
Calcium: 9.4 mg/dL (ref 8.4–10.4)
Chloride: 106 mEq/L (ref 98–109)
Creatinine: 0.9 mg/dL (ref 0.6–1.1)
Glucose: 134 mg/dl (ref 70–140)
Potassium: 3.5 mEq/L (ref 3.5–5.1)
Sodium: 140 mEq/L (ref 136–145)
Total Bilirubin: 0.27 mg/dL (ref 0.20–1.20)
Total Protein: 6.2 g/dL — ABNORMAL LOW (ref 6.4–8.3)

## 2012-10-07 MED ORDER — SODIUM CHLORIDE 0.9 % IV SOLN
75.0000 mg/m2 | Freq: Once | INTRAVENOUS | Status: AC
  Administered 2012-10-07: 140 mg via INTRAVENOUS
  Filled 2012-10-07: qty 14

## 2012-10-07 MED ORDER — PROCHLORPERAZINE MALEATE 10 MG PO TABS: 10.0000 mg | ORAL_TABLET | Freq: Four times a day (QID) | ORAL | Status: DC | PRN

## 2012-10-07 MED ORDER — SODIUM CHLORIDE 0.9 % IV SOLN
Freq: Once | INTRAVENOUS | Status: AC
  Administered 2012-10-07: 11:00:00 via INTRAVENOUS

## 2012-10-07 MED ORDER — DEXAMETHASONE SODIUM PHOSPHATE 20 MG/5ML IJ SOLN
20.0000 mg | Freq: Once | INTRAMUSCULAR | Status: AC
  Administered 2012-10-07: 20 mg via INTRAVENOUS

## 2012-10-07 MED ORDER — SODIUM CHLORIDE 0.9 % IJ SOLN
10.0000 mL | INTRAMUSCULAR | Status: DC | PRN
  Administered 2012-10-07: 10 mL
  Filled 2012-10-07: qty 10

## 2012-10-07 MED ORDER — HEPARIN SOD (PORK) LOCK FLUSH 100 UNIT/ML IV SOLN
500.0000 [IU] | Freq: Once | INTRAVENOUS | Status: AC | PRN
  Administered 2012-10-07: 500 [IU]
  Filled 2012-10-07: qty 5

## 2012-10-07 MED ORDER — ONDANSETRON 16 MG/50ML IVPB (CHCC)
16.0000 mg | Freq: Once | INTRAVENOUS | Status: AC
  Administered 2012-10-07: 16 mg via INTRAVENOUS

## 2012-10-07 MED ORDER — SODIUM CHLORIDE 0.9 % IV SOLN
600.0000 mg/m2 | Freq: Once | INTRAVENOUS | Status: AC
  Administered 2012-10-07: 1120 mg via INTRAVENOUS
  Filled 2012-10-07: qty 56

## 2012-10-07 NOTE — Patient Instructions (Addendum)
[facility] Discharge Instructions for Patients Receiving Chemotherapy  Today you received the following chemotherapy agents Taxotere and Cytoxan.  To help prevent nausea and vomiting after your treatment, we encourage you to take your nausea medication as directed  If you develop nausea and vomiting that is not controlled by your nausea medication, call the clinic.   BELOW ARE SYMPTOMS THAT SHOULD BE REPORTED IMMEDIATELY:  *FEVER GREATER THAN 100.5 F  *CHILLS WITH OR WITHOUT FEVER  NAUSEA AND VOMITING THAT IS NOT CONTROLLED WITH YOUR NAUSEA MEDICATION  *UNUSUAL SHORTNESS OF BREATH  *UNUSUAL BRUISING OR BLEEDING  TENDERNESS IN MOUTH AND THROAT WITH OR WITHOUT PRESENCE OF ULCERS  *URINARY PROBLEMS  *BOWEL PROBLEMS  UNUSUAL RASH Items with * indicate a potential emergency and should be followed up as soon as possible.  Feel free to call the clinic you have any questions or concerns. The clinic phone number is [phone].  

## 2012-10-07 NOTE — Progress Notes (Signed)
OFFICE PROGRESS NOTE  CCGildardo Cranker, DO [address] [place] [address]  DIAGNOSIS: 57 year old female with stage IIB right breast cancer, ER positive, PR positive, HER-2/neu negative. Node positive   PRIOR THERAPY:  #1 .Who was seen in the multidisciplinary breast clinic for consultation. Patient noted a mass in the right breast about 4 months ago. She's had a mammogram every year on an annual basis. On her prior mammogram there were no suspicious findings. But because of the mass she presented for workup for the palpable mass. On mammogram there was noted to be an abnormality at the 11:00 position in the right breast corresponding to the palpable abnormality. There were also noted to be 2 additional suspicious masses within the right breast as well as abnormal right axillary lymph nodes. Patient went on to have an ultrasound-guided core needle biopsy of all 3 masses and the axillary lymph node. All 3 masses were positive for invasive ductal carcinoma. 2 primaries appear to be HER-2/neu negative ER positive PR +1 of the primaries demonstrated mucinous features with Ki-67 20%. The lymph node had a higher Ki-67 of 70%. Indicating there were 2 synchronous primaries. Patient underwent MRI of the breasts. Again 3 lesions were noted within the right breast. The larger mass was at the 10:00 position measuring 2.3 cm. An abnormal level I right axillary lymph node measures 3.0 cm. No abnormalities in the contralateral breast   #2 Patient is now status post right modified radical mastectomy. She was found to have multifocal disease. Tumor was ER PR positive HER-2/neu negative. She had one of 17 lymph nodes positive for metastatic disease. We discussed her pathology in detail today.   #3 patient began adjuvant chemotherapy consisting of Taxotere Cytoxan every 6 weeks with day 2 Neulasta. She has received a total of 6 cycles from 06/24/2012 through 10/07/2012.  #4 patient is scheduled to be  seen by Dr. Dorothy Puffer on 10/16/2012 for consideration of adjuvant radiation therapy.   CURRENT THERAPY: Taxotere Cytoxan cycle 6 day 1  INTERVAL HISTORY: Royal Beirne 57 y.o. female returns for f/u prior to her cycle 6 of chemotherapy. Overall she is doing well without any problems. She denies any fevers chills night sweats headaches shortness of breath chest pains palpitations. She does need her nausea medications refilled which I have done. She is also experiencing some lower extremity swelling which is minimal. This is most likely due to the steroids and the chemotherapy itself. I have recommended she keep her feet elevated. She has no peripheral paresthesias no easy bruising remainder of the 10 point review of systems is negative.  MEDICAL HISTORY: Past Medical History  Diagnosis Date  . Hypertension   . Hypercholesteremia     taken off chol meds Dec 2012  . Acid reflux   . Breast cancer   . Heartburn     ALLERGIES:  has No Known Allergies.  MEDICATIONS:  Current Outpatient Prescriptions  Medication Sig Dispense Refill  . acetaminophen (TYLENOL) 325 MG tablet Take 2 tablets (650 mg total) by mouth every 6 (six) hours as needed for pain.  60 tablet  2  . dexamethasone (DECADRON) 4 MG tablet Take 2 tablets (8 mg total) by mouth as directed. Take two tablets by mouth once the day after chemo, and then two tablets by mouth twice a day for two days with food.  36 tablet  3  . famotidine (PEPCID) 20 MG tablet Take 20 mg by mouth 2 (two) times  daily.      . guaiFENesin (MUCINEX) 600 MG 12 hr tablet Take 1,200 mg by mouth 2 (two) times daily as needed. For cough      . lidocaine-prilocaine (EMLA) cream Apply topically as needed.  30 g  6  . lisinopril-hydrochlorothiazide (PRINZIDE,ZESTORETIC) 20-25 MG per tablet Take 1 tablet by mouth daily.      Marland Kitchen loratadine (CLARITIN) 10 MG tablet Take 1 tablet (10 mg total) by mouth daily.  30 tablet  2  . ondansetron (ZOFRAN) 8 MG tablet Take by  mouth every 8 (eight) hours as needed for nausea.      . potassium chloride SA (K-DUR,KLOR-CON) 20 MEQ tablet Take 1 tablet (20 mEq total) by mouth daily.  30 tablet  0  . pravastatin (PRAVACHOL) 40 MG tablet Take 40 mg by mouth daily.      Marland Kitchen Prochlorperazine Maleate (COMPAZINE PO) Take by mouth.      . sodium chloride (OCEAN) 0.65 % nasal spray Place 4 sprays into the nose daily as needed. For congestion.      . B Complex-C (SUPER B COMPLEX PO) Take 1 tablet by mouth daily.      Marland Kitchen oxyCODONE-acetaminophen (PERCOCET/ROXICET) 5-325 MG per tablet Take 1-2 tablets by mouth every 4 (four) hours as needed.  40 tablet  0   No current facility-administered medications for this visit.    SURGICAL HISTORY:  Past Surgical History  Procedure Laterality Date  . Cesarean section  25 years ago  . Tubal ligation    . Mastectomy Right   . Modified mastectomy Right 05/20/2012    Procedure: MODIFIED MASTECTOMY;  Surgeon: Mariella Saa, MD;  Location: [facility] OR;  Service: General;  Laterality: Right;  . Portacath placement Left 05/20/2012    Procedure: INSERTION PORT-A-CATH;  Surgeon: Mariella Saa, MD;  Location: [facility] OR;  Service: General;  Laterality: Left;  . Breast surgery Right 05/20/12    Rt br mastectomy    REVIEW OF SYSTEMS:  General: fatigue (+), night sweats (-), fever (-), pain (-) Lymph: palpable nodes (-) HEENT: vision changes (-), mucositis (-), gum bleeding (-), epistaxis (-) Cardiovascular: chest pain (-), palpitations (-) Pulmonary: shortness of breath (-), dyspnea on exertion (-), cough (-), hemoptysis (-) GI:  Early satiety (-), melena (-), dysphagia (-), nausea/vomiting (+), diarrhea (-) GU: dysuria (-), hematuria (-), incontinence (-) Musculoskeletal: joint swelling (-), joint pain (-), back pain (-) Neuro: weakness (-), numbness (-), headache (-), confusion (-) Skin: Rash (-), lesions (-), dryness (-) Psych: depression (-), suicidal/homicidal ideation (-), feeling of  hopelessness (-)   PHYSICAL EXAMINATION: Blood pressure 102/69, pulse 90, temperature 98.4 F (36.9 C), temperature source Oral, resp. rate 20, height 5\' 3"  (1.6 m), weight 189 lb 14.4 oz (86.138 kg). Body mass index is 33.65 kg/(m^2). General: Patient is a well appearing female in no acute distress HEENT: PERRLA, sclerae anicteric no conjunctival pallor, MMM Neck: supple, no palpable adenopathy Lungs: clear to auscultation bilaterally, no wheezes, rhonchi, or rales Cardiovascular: regular rate rhythm, S1, S2, no murmurs, rubs or gallops Abdomen: Soft, non-tender, non-distended, normoactive bowel sounds, no HSM Extremities: warm and well perfused, no clubbing, cyanosis, or edema Skin: No rashes or lesions Neuro: Non-focal Breasts: right mastecomy site well healed, no nodularity, left breast, no masses, nodules or skin changes noted ECOG PERFORMANCE STATUS: 1 - Symptomatic but completely ambulatory      LABORATORY DATA: Lab Results  Component Value Date   WBC 3.4* 10/07/2012   HGB 10.6*  10/07/2012   HCT 33.1* 10/07/2012   MCV 91.2 10/07/2012   PLT 235 10/07/2012      Chemistry      Component Value Date/Time   NA 139 09/23/2012 1040   NA 138 05/21/2012 0515   K 3.2* 09/23/2012 1040   K 4.2 05/21/2012 0515   CL 103 09/23/2012 1040   CL 104 05/21/2012 0515   CO2 25 09/23/2012 1040   CO2 25 05/21/2012 0515   BUN 8.4 09/23/2012 1040   BUN 10 05/21/2012 0515   CREATININE 0.9 09/23/2012 1040   CREATININE 0.89 05/21/2012 0515   CREATININE 0.85 03/10/2012 1544      Component Value Date/Time   CALCIUM 9.6 09/23/2012 1040   CALCIUM 9.1 05/21/2012 0515   ALKPHOS 82 09/23/2012 1040   ALKPHOS 47 04/06/2012 2306   AST 20 09/23/2012 1040   AST 21 04/06/2012 2306   ALT 23 09/23/2012 1040   ALT 16 04/06/2012 2306   BILITOT 0.33 09/23/2012 1040   BILITOT 0.2* 04/06/2012 2306    REASON FOR ADDENDUM, AMENDMENT OR CORRECTION: [id number]: Modification of the tumor staging. 05/28/12 08:14:17 AM (gt) ADDITIONAL  INFORMATION: CHROMOGENIC IN-SITU HYBRIDIZATION (Block 1G) Interpretation HER-2/NEU BY CISH - NO AMPLIFICATION OF HER-2 DETECTED. THE RATIO OF HER-2: CEP 17 SIGNALS WAS 0.93. Reference range: Ratio: HER2:CEP17 < 1.8 - gene amplification not observed Ratio: HER2:CEP 17 1.8-2.2 - equivocal result Ratio: HER2:CEP17 > 2.2 - gene amplification observed Jimmy Picket MD Pathologist, Electronic Signature ( Signed 05/28/2012) CHROMOGENIC IN-SITU HYBRIDIZATION (Block 1D) Interpretation HER-2/NEU BY CISH - NO AMPLIFICATION OF HER-2 DETECTED. THE RATIO OF HER-2: CEP 17 SIGNALS WAS 1.15. Reference range: Ratio: HER2:CEP17 < 1.8 - gene amplification not observed Ratio: HER2:CEP 17 1.8-2.2 - equivocal result Ratio: HER2:CEP17 > 2.2 - gene amplification observed Jimmy Picket MD 1 of 4 Amended copy Amended FINAL for LOLLAR, Jeslie [phone]) ADDITIONAL INFORMATION:(continued) Pathologist, Electronic Signature ( Signed 05/28/2012) CHROMOGENIC IN-SITU HYBRIDIZATION (Block 1A) Interpretation HER-2/NEU BY CISH - NO AMPLIFICATION OF HER-2 DETECTED. THE RATIO OF HER-2: CEP 17 SIGNALS WAS 1.28. Reference range: Ratio: HER2:CEP17 < 1.8 - gene amplification not observed Ratio: HER2:CEP 17 1.8-2.2 - equivocal result Ratio: HER2:CEP17 > 2.2 - gene amplification observed Jimmy Picket MD Pathologist, Electronic Signature ( Signed 05/28/2012) FINAL DIAGNOSIS Diagnosis Breast, modified radical mastectomy , Right - TUMOR #1 (COIL CLIP) - INVASIVE DUCTAL CARCINOMA, GRADE II (1.9 CM), SEE COMMENT. - INVASIVE CARCINOMA IS 4 CM FROM NEAREST MARGIN (DEEP). - DUCTAL CARCINOMA IN SITU, GRADE III WITH COMEDO NECROSIS AND CALCIFICATIONS. - TUMOR #2 (RIBBON CLIP) - INVASIVE DUCTAL CARCINOMA WITH EXTRAVASATED MUCIN (COLLOID CARCINOMA) (2.5 CM), SEE COMMENT. - INVASIVE TUMOR IS 3 CM FROM NEAREST MARGIN (DEEP). - TUMOR #3 (WING CLIP) - INVASIVE DUCTAL CARCINOMA WITH EXTRAVASATED MUCIN (COLLOID CARCINOMA)  (1.8 CM). - INVASIVE TUMOR IS 1.8 CM FROM NEAREST MARGIN (DEEP). - ONE LYMPH NODE, POSITIVE FOR METASTATIC MAMMARY CARCINOMA (1/17). Microscopic Comment BREAST, INVASIVE TUMOR, WITH LYMPH NODE SAMPLING Specimen, including laterality: Right breast Procedure: Modified radial mastectomy Grade: #1 = Grade II; #2 = Grade III; #3 = Grade III Tubule formation: 2;3;3 Nuclear pleomorphism: 3;3;3 Mitotic: 2;2;2 Tumor size (gross measurement): Tumor #1 = 1.9 cm; tumor #2 = 3.0 cm; tumor #3 = 1.8 cm 2 of 4 Amended copy Amended FINAL for Read, Kanda [phone]) Microscopic Comment(continued) Margins: Invasive, distance to closest margin: 4 cm; 3 cm; 1.8 cm In-situ, distance to closest margin: 4.0 cm; N/A; N/A If margin positive, focally or broadly: N/A Lymphovascular invasion:  Present Ductal carcinoma in situ: Present Grade: III Extensive intraductal component: Absent Lobular neoplasia: Absent Tumor focality: Multifocal Treatment effect: None If present, treatment effect in breast tissue, lymph nodes or both: N/A Extent of tumor: Skin: Grossly negative Nipple: Grossly negative Skeletal muscle: N/A Lymph nodes: # examined: 17 Lymph nodes with metastasis: 1 Isolated tumor cells (< 0.2 mm): 0 Micrometastasis: (> 0.2 mm and < 2.0 mm): 0 Macrometastasis: (> 2.0 mm): At least 1.7 cm, see comment Extracapsular extension: Absent see comment Breast prognostic profile: Estrogen receptor: Not repeated, previous studies demonstrated 100% and 100% of positivity ([id number]) Progesterone receptor: Not repeated, previous studies demonstrated 100% and 99% of positivity ([id number]) Her 2 neu: Repeated, previous study demonstrated no amplification (0.94 and 1.28) ([id number]) Ki-67: Not repeated, previous studies demonstrated 19% and 77% proliferation rate ([id number]) Non-neoplastic breast: Fibrocystic change, pseudoangiomatous hyperplasia, microcalcifications, and fibroadenomatoid  nodules. TNM: mpT2, pN1a, pMX Comments: The largest lymph node (3 cm) is the one involved by metastatic mammary carcinoma (slide 1V). The lymph node was submitted in multiple sections for review. The metastatic tumor deposits range from 1.4 to 1.7 cm in these sections. Definitive evidence of extracapsular invasion is not identified. (CRR:caf 05/21/12) [place] RUND DO Pathologist, Electronic Signature   RADIOGRAPHIC STUDIES:  No results found.  ASSESSMENT: 57 year old female with  #1 stage II a multifocal invasive ductal carcinoma of the right breast status post mastectomy with axillary lymph node dissection.  Her final pathology revealed multifocal disease with the largest tumor mass measuring 3.0 cm. One of 17 lymph nodes was positive for metastatic disease.  #2 patient then again adjuvant chemotherapy consisting of Taxotere and Cytoxan given every 3 weeks starting on 06/24/2012 through 10/07/2012. She completed a total of 6 cycles of chemotherapy. She did develop hypokalemia during her therapy as well as lower extremity swelling.  #3 patient is also referred to radiation oncology for consideration of postmastectomy radiation due to multiple focality of the disease as well as positive lymph node.   PLAN:  #1 patient will proceed with her final cycle of Taxotere and Cytoxan today.  #2 she is scheduled to see Dr. Dorothy Puffer on 10/16/2012 for reconsultation for radiation oncology.  #3 we will see her back in one week's time for followup and interim lab.  All questions were answered. The patient knows to call the clinic with any problems, questions or concerns. We can certainly see the patient much sooner if necessary.  I spent 25 minutes counseling the patient face to face. The total time spent in the appointment was 30 minutes.  Drue Second, MD Medical/Oncology [facility] [phone] (beeper) [phone] (Office)  10/07/2012, 10:14 AM

## 2012-10-07 NOTE — Patient Instructions (Addendum)
#  1 congratulations today is her last day of chemotherapy. You are now receiving cycle 6 of Taxotere and Cytoxan.  #2 you'll be seen back here in oncology in one week's time for blood work.  #3 you will need to be seen by radiation oncology, you have an appointment set up to see Dr. Dorothy Puffer on 10/16/2012.

## 2012-10-08 ENCOUNTER — Ambulatory Visit (HOSPITAL_BASED_OUTPATIENT_CLINIC_OR_DEPARTMENT_OTHER): Payer: No Typology Code available for payment source

## 2012-10-08 VITALS — BP 138/67 | HR 76 | Temp 98.1°F

## 2012-10-08 DIAGNOSIS — C773 Secondary and unspecified malignant neoplasm of axilla and upper limb lymph nodes: Secondary | ICD-10-CM

## 2012-10-08 DIAGNOSIS — C50419 Malignant neoplasm of upper-outer quadrant of unspecified female breast: Secondary | ICD-10-CM

## 2012-10-08 DIAGNOSIS — Z5189 Encounter for other specified aftercare: Secondary | ICD-10-CM

## 2012-10-08 DIAGNOSIS — C50411 Malignant neoplasm of upper-outer quadrant of right female breast: Secondary | ICD-10-CM

## 2012-10-08 MED ORDER — PEGFILGRASTIM INJECTION 6 MG/0.6ML
6.0000 mg | Freq: Once | SUBCUTANEOUS | Status: AC
  Administered 2012-10-08: 6 mg via SUBCUTANEOUS
  Filled 2012-10-08: qty 0.6

## 2012-10-14 ENCOUNTER — Other Ambulatory Visit (HOSPITAL_BASED_OUTPATIENT_CLINIC_OR_DEPARTMENT_OTHER): Payer: Medicaid Other | Admitting: Lab

## 2012-10-14 ENCOUNTER — Encounter: Payer: Self-pay | Admitting: Adult Health

## 2012-10-14 ENCOUNTER — Ambulatory Visit: Payer: No Typology Code available for payment source | Admitting: Physical Therapy

## 2012-10-14 ENCOUNTER — Telehealth: Payer: Self-pay | Admitting: Oncology

## 2012-10-14 ENCOUNTER — Ambulatory Visit (HOSPITAL_BASED_OUTPATIENT_CLINIC_OR_DEPARTMENT_OTHER): Payer: Medicaid Other | Admitting: Adult Health

## 2012-10-14 VITALS — BP 114/71 | HR 109 | Temp 98.1°F | Resp 20 | Wt 185.0 lb

## 2012-10-14 DIAGNOSIS — C50419 Malignant neoplasm of upper-outer quadrant of unspecified female breast: Secondary | ICD-10-CM

## 2012-10-14 DIAGNOSIS — C50411 Malignant neoplasm of upper-outer quadrant of right female breast: Secondary | ICD-10-CM

## 2012-10-14 LAB — CBC WITH DIFFERENTIAL/PLATELET
BASO%: 0.6 % (ref 0.0–2.0)
Basophils Absolute: 0.1 10*3/uL (ref 0.0–0.1)
EOS%: 0.4 % (ref 0.0–7.0)
Eosinophils Absolute: 0.1 10*3/uL (ref 0.0–0.5)
HCT: 32.1 % — ABNORMAL LOW (ref 34.8–46.6)
HGB: 10.7 g/dL — ABNORMAL LOW (ref 11.6–15.9)
LYMPH%: 11 % — ABNORMAL LOW (ref 14.0–49.7)
MCH: 30.2 pg (ref 25.1–34.0)
MCHC: 33.2 g/dL (ref 31.5–36.0)
MCV: 90.9 fL (ref 79.5–101.0)
MONO#: 1.2 10*3/uL — ABNORMAL HIGH (ref 0.1–0.9)
MONO%: 9.1 % (ref 0.0–14.0)
NEUT#: 10.4 10*3/uL — ABNORMAL HIGH (ref 1.5–6.5)
NEUT%: 78.9 % — ABNORMAL HIGH (ref 38.4–76.8)
Platelets: 196 10*3/uL (ref 145–400)
RBC: 3.54 10*6/uL — ABNORMAL LOW (ref 3.70–5.45)
RDW: 16.5 % — ABNORMAL HIGH (ref 11.2–14.5)
WBC: 13.2 10*3/uL — ABNORMAL HIGH (ref 3.9–10.3)
lymph#: 1.4 10*3/uL (ref 0.9–3.3)

## 2012-10-14 LAB — COMPREHENSIVE METABOLIC PANEL (CC13)
ALT: 22 U/L (ref 0–55)
AST: 18 U/L (ref 5–34)
Albumin: 3.4 g/dL — ABNORMAL LOW (ref 3.5–5.0)
Alkaline Phosphatase: 81 U/L (ref 40–150)
BUN: 11.5 mg/dL (ref 7.0–26.0)
CO2: 23 mEq/L (ref 22–29)
Calcium: 9.8 mg/dL (ref 8.4–10.4)
Chloride: 106 mEq/L (ref 98–109)
Creatinine: 1 mg/dL (ref 0.6–1.1)
Glucose: 179 mg/dl — ABNORMAL HIGH (ref 70–140)
Potassium: 3.8 mEq/L (ref 3.5–5.1)
Sodium: 141 mEq/L (ref 136–145)
Total Bilirubin: 0.25 mg/dL (ref 0.20–1.20)
Total Protein: 6.2 g/dL — ABNORMAL LOW (ref 6.4–8.3)

## 2012-10-14 NOTE — Progress Notes (Signed)
Location of Breast Cancer: right  , 11:00  Histology per Pathology Report: Breast, modified radical mastectomy , Right - TUMOR #1 (COIL CLIP) - INVASIVE DUCTAL CARCINOMA, GRADE II (1.9 CM), SEE COMMENT. - INVASIVE CARCINOMA IS 4 CM FROM NEAREST MARGIN (DEEP). - DUCTAL CARCINOMA IN SITU, GRADE III WITH COMEDO NECROSIS AND CALCIFICATIONS. - TUMOR #2 (RIBBON CLIP) - INVASIVE DUCTAL CARCINOMA WITH EXTRAVASATED MUCIN (COLLOID CARCINOMA) (2.5 CM), SEE COMMENT. - INVASIVE TUMOR IS 3 CM FROM NEAREST MARGIN (DEEP). - TUMOR #3 (WING CLIP) - INVASIVE DUCTAL CARCINOMA WITH EXTRAVASATED MUCIN (COLLOID CARCINOMA) (1.8 CM). - INVASIVE TUMOR IS 1.8 CM FROM NEAREST MARGIN (DEEP). - ONE LYMPH NODE, POSITIVE FOR METASTATIC MAMMARY CARCINOMA (1/17).  Receptor Status: ER(postive), PR (positive), Her2-neu (negative)  Did patient present with symptoms (if so, please note symptoms) or was this found on screening mammography?: patient found a mass in her right breast.  Past/Anticipated interventions by surgeon, if any: right mastectomy 05/20/12  Past/Anticipated interventions by medical oncology, if any: patient began adjuvant chemotherapy consisting of Taxotere Cytoxan every 6 weeks with day 2 Neulasta. She has received a total of 6 cycles from 06/24/2012 through 10/07/2012.  Lymphedema issues, if any:  Right arm, going to lymphedema clinic, getting measured for sleeve today  Pain issues, if any:  no  SAFETY ISSUES:  Prior radiation? no  Pacemaker/ICD? no  Possible current pregnancy? no  Is the patient on methotrexate? no  Current Complaints / other details:  Numbness of fingers, swelling in feet, denies pain, loss of appetite, is fatigued. 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

## 2012-10-14 NOTE — Patient Instructions (Signed)
Doing well.  Labs are stable.  We will see you back in 4 weeks.  Please call us if you have any questions or concerns.

## 2012-10-14 NOTE — Telephone Encounter (Signed)
gv pt appt schedule for August. Added flush - per 7/15 pof f/u as scheduled (8/12).

## 2012-10-14 NOTE — Progress Notes (Signed)
OFFICE PROGRESS NOTE  CCGildardo Cranker, DO [address] [place] [address]  DIAGNOSIS: 57 year old female with stage IIB right breast cancer, ER positive, PR positive, HER-2/neu negative. Node positive   PRIOR THERAPY:  #1 .Who was seen in the multidisciplinary breast clinic for consultation. Patient noted a mass in the right breast about 4 months ago. She's had a mammogram every year on an annual basis. On her prior mammogram there were no suspicious findings. But because of the mass she presented for workup for the palpable mass. On mammogram there was noted to be an abnormality at the 11:00 position in the right breast corresponding to the palpable abnormality. There were also noted to be 2 additional suspicious masses within the right breast as well as abnormal right axillary lymph nodes. Patient went on to have an ultrasound-guided core needle biopsy of all 3 masses and the axillary lymph node. All 3 masses were positive for invasive ductal carcinoma. 2 primaries appear to be HER-2/neu negative ER positive PR +1 of the primaries demonstrated mucinous features with Ki-67 20%. The lymph node had a higher Ki-67 of 70%. Indicating there were 2 synchronous primaries. Patient underwent MRI of the breasts. Again 3 lesions were noted within the right breast. The larger mass was at the 10:00 position measuring 2.3 cm. An abnormal level I right axillary lymph node measures 3.0 cm. No abnormalities in the contralateral breast   #2 Patient is now status post right modified radical mastectomy. She was found to have multifocal disease. Tumor was ER PR positive HER-2/neu negative. She had one of 17 lymph nodes positive for metastatic disease. We discussed her pathology in detail today.   #3 patient began adjuvant chemotherapy consisting of Taxotere Cytoxan every 6 weeks with day 2 Neulasta. She has received a total of 6 cycles from 06/24/2012 through 10/07/2012.  #4 patient is scheduled to be  seen by Dr. Dorothy Puffer on 10/16/2012 for consideration of adjuvant radiation therapy.   CURRENT THERAPY: Taxotere Cytoxan cycle 6 day 8  INTERVAL HISTORY: Tasha Ortiz 57 y.o. female returns for f/u following her final cycle of adjuvant Taxotere/Cytoxan.  She is doing well today.  She has been going to the lymphedema clinic and her right arm lymphedema is improving.  She denies fevers, chills, nausea, vomiting, constipation, diarrhea.  She has mild intermittent numbness in the very tips of her fingers, and she takes Super B complex daily for this.  She remains fatigued.  Otherwise, a 10 point ROS is neg.   MEDICAL HISTORY: Past Medical History  Diagnosis Date  . Hypertension   . Hypercholesteremia     taken off chol meds Dec 2012  . Acid reflux   . Breast cancer   . Heartburn     ALLERGIES:  has No Known Allergies.  MEDICATIONS:  Current Outpatient Prescriptions  Medication Sig Dispense Refill  . acetaminophen (TYLENOL) 325 MG tablet Take 2 tablets (650 mg total) by mouth every 6 (six) hours as needed for pain.  60 tablet  2  . B Complex-C (SUPER B COMPLEX PO) Take 1 tablet by mouth daily.      Marland Kitchen dexamethasone (DECADRON) 4 MG tablet Take 2 tablets (8 mg total) by mouth as directed. Take two tablets by mouth once the day after chemo, and then two tablets by mouth twice a day for two days with food.  36 tablet  3  . famotidine (PEPCID) 20 MG tablet Take 20 mg by mouth 2 (  two) times daily.      Marland Kitchen guaiFENesin (MUCINEX) 600 MG 12 hr tablet Take 1,200 mg by mouth 2 (two) times daily as needed. For cough      . lidocaine-prilocaine (EMLA) cream Apply topically as needed.  30 g  6  . lisinopril-hydrochlorothiazide (PRINZIDE,ZESTORETIC) 20-25 MG per tablet Take 1 tablet by mouth daily.      Marland Kitchen loratadine (CLARITIN) 10 MG tablet Take 1 tablet (10 mg total) by mouth daily.  30 tablet  2  . ondansetron (ZOFRAN) 8 MG tablet Take by mouth every 8 (eight) hours as needed for nausea.      Marland Kitchen  oxyCODONE-acetaminophen (PERCOCET/ROXICET) 5-325 MG per tablet Take 1-2 tablets by mouth every 4 (four) hours as needed.  40 tablet  0  . potassium chloride SA (K-DUR,KLOR-CON) 20 MEQ tablet Take 1 tablet (20 mEq total) by mouth daily.  30 tablet  0  . pravastatin (PRAVACHOL) 40 MG tablet Take 40 mg by mouth daily.      . prochlorperazine (COMPAZINE) 10 MG tablet Take 1 tablet (10 mg total) by mouth every 6 (six) hours as needed.  30 tablet  0  . sodium chloride (OCEAN) 0.65 % nasal spray Place 4 sprays into the nose daily as needed. For congestion.      . [DISCONTINUED] Prochlorperazine Maleate (COMPAZINE PO) Take by mouth.       No current facility-administered medications for this visit.    SURGICAL HISTORY:  Past Surgical History  Procedure Laterality Date  . Cesarean section  25 years ago  . Tubal ligation    . Mastectomy Right   . Modified mastectomy Right 05/20/2012    Procedure: MODIFIED MASTECTOMY;  Surgeon: Mariella Saa, MD;  Location: [facility] OR;  Service: General;  Laterality: Right;  . Portacath placement Left 05/20/2012    Procedure: INSERTION PORT-A-CATH;  Surgeon: Mariella Saa, MD;  Location: [facility] OR;  Service: General;  Laterality: Left;  . Breast surgery Right 05/20/12    Rt br mastectomy    REVIEW OF SYSTEMS:  General: fatigue (+), night sweats (-), fever (-), pain (-) Lymph: palpable nodes (-) HEENT: vision changes (-), mucositis (-), gum bleeding (-), epistaxis (-) Cardiovascular: chest pain (-), palpitations (-) Pulmonary: shortness of breath (-), dyspnea on exertion (-), cough (-), hemoptysis (-) GI:  Early satiety (-), melena (-), dysphagia (-), nausea/vomiting (+), diarrhea (-) GU: dysuria (-), hematuria (-), incontinence (-) Musculoskeletal: joint swelling (-), joint pain (-), back pain (-) Neuro: weakness (-), numbness (-), headache (-), confusion (-) Skin: Rash (-), lesions (-), dryness (-) Psych: depression (-), suicidal/homicidal ideation (-),  feeling of hopelessness (-)   PHYSICAL EXAMINATION: Blood pressure 114/71, pulse 109, temperature 98.1 F (36.7 C), temperature source Oral, resp. rate 20, weight 185 lb (83.915 kg). Body mass index is 32.78 kg/(m^2). General: Patient is a well appearing female in no acute distress HEENT: PERRLA, sclerae anicteric no conjunctival pallor, MMM Neck: supple, no palpable adenopathy Lungs: clear to auscultation bilaterally, no wheezes, rhonchi, or rales Cardiovascular: regular rate rhythm, S1, S2, no murmurs, rubs or gallops Abdomen: Soft, non-tender, non-distended, normoactive bowel sounds, no HSM Extremities: warm and well perfused, no clubbing, cyanosis, or edema Skin: No rashes or lesions Neuro: Non-focal Breasts: right mastecomy site well healed, no nodularity, left breast, no masses, nodules or skin changes noted ECOG PERFORMANCE STATUS: 1 - Symptomatic but completely ambulatory      LABORATORY DATA: Lab Results  Component Value Date   WBC 13.2* 10/14/2012  HGB 10.7* 10/14/2012   HCT 32.1* 10/14/2012   MCV 90.9 10/14/2012   PLT 196 10/14/2012      Chemistry      Component Value Date/Time   NA 140 10/07/2012 0923   NA 138 05/21/2012 0515   K 3.5 10/07/2012 0923   K 4.2 05/21/2012 0515   CL 103 09/23/2012 1040   CL 104 05/21/2012 0515   CO2 27 10/07/2012 0923   CO2 25 05/21/2012 0515   BUN 12.7 10/07/2012 0923   BUN 10 05/21/2012 0515   CREATININE 0.9 10/07/2012 0923   CREATININE 0.89 05/21/2012 0515   CREATININE 0.85 03/10/2012 1544      Component Value Date/Time   CALCIUM 9.4 10/07/2012 0923   CALCIUM 9.1 05/21/2012 0515   ALKPHOS 40 10/07/2012 0923   ALKPHOS 47 04/06/2012 2306   AST 23 10/07/2012 0923   AST 21 04/06/2012 2306   ALT 25 10/07/2012 0923   ALT 16 04/06/2012 2306   BILITOT 0.27 10/07/2012 0923   BILITOT 0.2* 04/06/2012 2306    REASON FOR ADDENDUM, AMENDMENT OR CORRECTION: [id number]: Modification of the tumor staging. 05/28/12 08:14:17 AM (gt) ADDITIONAL  INFORMATION: CHROMOGENIC IN-SITU HYBRIDIZATION (Block 1G) Interpretation HER-2/NEU BY CISH - NO AMPLIFICATION OF HER-2 DETECTED. THE RATIO OF HER-2: CEP 17 SIGNALS WAS 0.93. Reference range: Ratio: HER2:CEP17 < 1.8 - gene amplification not observed Ratio: HER2:CEP 17 1.8-2.2 - equivocal result Ratio: HER2:CEP17 > 2.2 - gene amplification observed Jimmy Picket MD Pathologist, Electronic Signature ( Signed 05/28/2012) CHROMOGENIC IN-SITU HYBRIDIZATION (Block 1D) Interpretation HER-2/NEU BY CISH - NO AMPLIFICATION OF HER-2 DETECTED. THE RATIO OF HER-2: CEP 17 SIGNALS WAS 1.15. Reference range: Ratio: HER2:CEP17 < 1.8 - gene amplification not observed Ratio: HER2:CEP 17 1.8-2.2 - equivocal result Ratio: HER2:CEP17 > 2.2 - gene amplification observed Jimmy Picket MD 1 of 4 Amended copy Amended FINAL for PHUNG, Ladrea [phone]) ADDITIONAL INFORMATION:(continued) Pathologist, Electronic Signature ( Signed 05/28/2012) CHROMOGENIC IN-SITU HYBRIDIZATION (Block 1A) Interpretation HER-2/NEU BY CISH - NO AMPLIFICATION OF HER-2 DETECTED. THE RATIO OF HER-2: CEP 17 SIGNALS WAS 1.28. Reference range: Ratio: HER2:CEP17 < 1.8 - gene amplification not observed Ratio: HER2:CEP 17 1.8-2.2 - equivocal result Ratio: HER2:CEP17 > 2.2 - gene amplification observed Jimmy Picket MD Pathologist, Electronic Signature ( Signed 05/28/2012) FINAL DIAGNOSIS Diagnosis Breast, modified radical mastectomy , Right - TUMOR #1 (COIL CLIP) - INVASIVE DUCTAL CARCINOMA, GRADE II (1.9 CM), SEE COMMENT. - INVASIVE CARCINOMA IS 4 CM FROM NEAREST MARGIN (DEEP). - DUCTAL CARCINOMA IN SITU, GRADE III WITH COMEDO NECROSIS AND CALCIFICATIONS. - TUMOR #2 (RIBBON CLIP) - INVASIVE DUCTAL CARCINOMA WITH EXTRAVASATED MUCIN (COLLOID CARCINOMA) (2.5 CM), SEE COMMENT. - INVASIVE TUMOR IS 3 CM FROM NEAREST MARGIN (DEEP). - TUMOR #3 (WING CLIP) - INVASIVE DUCTAL CARCINOMA WITH EXTRAVASATED MUCIN (COLLOID CARCINOMA)  (1.8 CM). - INVASIVE TUMOR IS 1.8 CM FROM NEAREST MARGIN (DEEP). - ONE LYMPH NODE, POSITIVE FOR METASTATIC MAMMARY CARCINOMA (1/17). Microscopic Comment BREAST, INVASIVE TUMOR, WITH LYMPH NODE SAMPLING Specimen, including laterality: Right breast Procedure: Modified radial mastectomy Grade: #1 = Grade II; #2 = Grade III; #3 = Grade III Tubule formation: 2;3;3 Nuclear pleomorphism: 3;3;3 Mitotic: 2;2;2 Tumor size (gross measurement): Tumor #1 = 1.9 cm; tumor #2 = 3.0 cm; tumor #3 = 1.8 cm 2 of 4 Amended copy Amended FINAL for Broski, Kamariyah [phone]) Microscopic Comment(continued) Margins: Invasive, distance to closest margin: 4 cm; 3 cm; 1.8 cm In-situ, distance to closest margin: 4.0 cm; N/A; N/A If margin positive, focally or broadly: N/A  Lymphovascular invasion: Present Ductal carcinoma in situ: Present Grade: III Extensive intraductal component: Absent Lobular neoplasia: Absent Tumor focality: Multifocal Treatment effect: None If present, treatment effect in breast tissue, lymph nodes or both: N/A Extent of tumor: Skin: Grossly negative Nipple: Grossly negative Skeletal muscle: N/A Lymph nodes: # examined: 17 Lymph nodes with metastasis: 1 Isolated tumor cells (< 0.2 mm): 0 Micrometastasis: (> 0.2 mm and < 2.0 mm): 0 Macrometastasis: (> 2.0 mm): At least 1.7 cm, see comment Extracapsular extension: Absent see comment Breast prognostic profile: Estrogen receptor: Not repeated, previous studies demonstrated 100% and 100% of positivity ([id number]) Progesterone receptor: Not repeated, previous studies demonstrated 100% and 99% of positivity ([id number]) Her 2 neu: Repeated, previous study demonstrated no amplification (0.94 and 1.28) ([id number]) Ki-67: Not repeated, previous studies demonstrated 19% and 77% proliferation rate ([id number]) Non-neoplastic breast: Fibrocystic change, pseudoangiomatous hyperplasia, microcalcifications, and fibroadenomatoid  nodules. TNM: mpT2, pN1a, pMX Comments: The largest lymph node (3 cm) is the one involved by metastatic mammary carcinoma (slide 1V). The lymph node was submitted in multiple sections for review. The metastatic tumor deposits range from 1.4 to 1.7 cm in these sections. Definitive evidence of extracapsular invasion is not identified. (CRR:caf 05/21/12) [place] RUND DO Pathologist, Electronic Signature   RADIOGRAPHIC STUDIES:  No results found.  ASSESSMENT: 57 year old female with  #1 stage II a multifocal invasive ductal carcinoma of the right breast status post mastectomy with axillary lymph node dissection.  Her final pathology revealed multifocal disease with the largest tumor mass measuring 3.0 cm. One of 17 lymph nodes was positive for metastatic disease.  #2 patient then again adjuvant chemotherapy consisting of Taxotere and Cytoxan given every 3 weeks starting on 06/24/2012 through 10/07/2012. She completed a total of 6 cycles of chemotherapy. She did develop hypokalemia during her therapy as well as lower extremity swelling.  #3 patient is also referred to radiation oncology for consideration of postmastectomy radiation due to multiple focality of the disease as well as positive lymph node.   PLAN:  #1 Doing well.  Labs are stable.  We reviewed the medications in her med list that she can stop taking.  She will continue therapy at the lymphedema clinic, her arm is much improved and she is waiting on a sleeve that has been ordered.    #2 she is scheduled to see Dr. Dorothy Puffer on 10/16/2012 for reconsultation for radiation oncology.  #3 we will see her back in four weeks for lab/appt/port flush.    All questions were answered. The patient knows to call the clinic with any problems, questions or concerns. We can certainly see the patient much sooner if necessary.  I spent 25 minutes counseling the patient face to face. The total time spent in the appointment was 30 minutes.  Cherie Ouch Lyn Hollingshead, NP Medical Oncology [facility] Phone: [phone] 10/14/2012, 11:05 AM

## 2012-10-16 ENCOUNTER — Ambulatory Visit
Admission: RE | Admit: 2012-10-16 | Discharge: 2012-10-16 | Disposition: A | Discharge status: Home / Self Care | Payer: Medicaid Other | Source: Ambulatory Visit | Attending: Radiation Oncology | Admitting: Radiation Oncology

## 2012-10-16 ENCOUNTER — Encounter: Payer: Self-pay | Admitting: Radiation Oncology

## 2012-10-16 VITALS — BP 105/70 | HR 90 | Temp 98.2°F | Resp 20 | Ht 63.0 in | Wt 186.2 lb

## 2012-10-16 DIAGNOSIS — Z170 Estrogen receptor positive status [ER+]: Secondary | ICD-10-CM | POA: Insufficient documentation

## 2012-10-16 DIAGNOSIS — C50919 Malignant neoplasm of unspecified site of unspecified female breast: Secondary | ICD-10-CM | POA: Insufficient documentation

## 2012-10-16 DIAGNOSIS — C50411 Malignant neoplasm of upper-outer quadrant of right female breast: Secondary | ICD-10-CM

## 2012-10-16 DIAGNOSIS — Z9221 Personal history of antineoplastic chemotherapy: Secondary | ICD-10-CM | POA: Insufficient documentation

## 2012-10-16 DIAGNOSIS — Z9010 Acquired absence of unspecified breast and nipple: Secondary | ICD-10-CM | POA: Insufficient documentation

## 2012-10-16 NOTE — Progress Notes (Signed)
Please see the Nurse Progress Note in the MD Initial Consult Encounter for this patient. 

## 2012-10-16 NOTE — Progress Notes (Signed)
Radiation Oncology         [phone] ________________________________  Name: Tasha Ortiz MRN: [id number]  Date: 10/16/2012  DOB: 06-04-1955  Follow-Up Visit Note  CC: [name], DO  Victorino December, MD  Glenna Fellows, MD  Diagnosis:   Invasive ductal carcinoma of the right breast, mT2 N1 A. N0  Interval Since Last Radiation:  Not applicable   Narrative:  The patient returns today for routine follow-up.  The patient was initially seen in multidisciplinary breast clinic. She is a 57 year old female who had node positive disease and was felt to be a good candidate for postmastectomy radiotherapy. She did also present with multicentric disease. She proceeded to undergo a mastectomy on 05/20/2012 which did demonstrate 3 foci of tumor with one at a 17 lymph nodes positive. Receptors pains have indicated that the tumor is ER positive, PR positive, and HER-2/neu negative. Lymphovascular space invasion was seen. The patient states that she did well postoperatively in terms of healing. She has proceeded with post surgical chemotherapy and she indicates that she has done very well she feels with this course of treatment also. She denies any major problems with this and she has finished this within the last couple of weeks.                              ALLERGIES:  has No Known Allergies.  Meds: Current Outpatient Prescriptions  Medication Sig Dispense Refill  . acetaminophen (TYLENOL) 325 MG tablet Take 2 tablets (650 mg total) by mouth every 6 (six) hours as needed for pain.  60 tablet  2  . B Complex-C (SUPER B COMPLEX PO) Take 1 tablet by mouth daily.      . famotidine (PEPCID) 20 MG tablet Take 20 mg by mouth 2 (two) times daily.      Marland Kitchen guaiFENesin (MUCINEX) 600 MG 12 hr tablet Take 1,200 mg by mouth 2 (two) times daily as needed. For cough      . lisinopril-hydrochlorothiazide (PRINZIDE,ZESTORETIC) 20-25 MG per tablet Take 1 tablet by mouth daily.      . pravastatin (PRAVACHOL) 40 MG  tablet Take 40 mg by mouth daily.      . sodium chloride (OCEAN) 0.65 % nasal spray Place 4 sprays into the nose daily as needed. For congestion.      . [DISCONTINUED] Prochlorperazine Maleate (COMPAZINE PO) Take by mouth.       No current facility-administered medications for this encounter.    Physical Findings: The patient is in no acute distress. Patient is alert and oriented.  height is 5\' 3"  (1.6 m) and weight is 186 lb 3.2 oz (84.46 kg). Her oral temperature is 98.2 F (36.8 C). Her blood pressure is 105/70 and her pulse is 90. Her respiration is 20. .   Mastectomy scar well healed. No nodules or suspicious findings in the chest wall. No axillary adenopathy on the right  Lab Findings: Lab Results  Component Value Date   WBC 13.2* 10/14/2012   HGB 10.7* 10/14/2012   HCT 32.1* 10/14/2012   MCV 90.9 10/14/2012   PLT 196 10/14/2012     Radiographic Findings: No results found.  Impression:    The patient has done well with her treatment plan so far. Her surgery went well but given several negative factors and the patient's relatively young age I still recommended that we proceed with postmastectomy radiotherapy to improve her chances of local/regional control. The  patient was in agreement and wishes to proceed with this plan.  Plan:  The patient will proceed with a simulation in the near future such that we can proceed with treatment planning. I anticipate 6-1/2 weeks radiation treatment to the right chest wall and right supraclavicular region.  I spent 15 minutes with the patient today, the majority of which was spent counseling the patient on the diagnosis of cancer and coordinating care.   Radene Gunning, M.D., Ph.D.

## 2012-10-21 ENCOUNTER — Ambulatory Visit: Payer: Medicaid Other | Admitting: Adult Health

## 2012-10-21 ENCOUNTER — Ambulatory Visit: Payer: No Typology Code available for payment source | Admitting: Physical Therapy

## 2012-10-21 ENCOUNTER — Other Ambulatory Visit: Payer: Medicaid Other | Admitting: Lab

## 2012-10-22 ENCOUNTER — Ambulatory Visit
Admission: RE | Admit: 2012-10-22 | Discharge: 2012-10-22 | Disposition: A | Discharge status: Home / Self Care | Payer: Medicaid Other | Source: Ambulatory Visit | Attending: Radiation Oncology | Admitting: Radiation Oncology

## 2012-10-22 DIAGNOSIS — C50411 Malignant neoplasm of upper-outer quadrant of right female breast: Secondary | ICD-10-CM

## 2012-10-22 DIAGNOSIS — C50919 Malignant neoplasm of unspecified site of unspecified female breast: Secondary | ICD-10-CM | POA: Insufficient documentation

## 2012-10-22 DIAGNOSIS — Z510 Encounter for antineoplastic radiation therapy: Secondary | ICD-10-CM | POA: Insufficient documentation

## 2012-10-24 NOTE — Progress Notes (Signed)
  Radiation Oncology         [phone] ________________________________  Name: Tasha Ortiz MRN: [id number]  Date: 10/22/2012  DOB: 04/22/1955   SIMULATION AND TREATMENT PLANNING NOTE  The patient presented for simulation prior to beginning her course of radiation treatment for her diagnosis of right-sided breast cancer. The patient was placed in a supine position on a breast board. A customized accuform device was also constructed and this complex treatment device will be used on a daily basis during her treatment. In this fashion, a CT scan was obtained through the chest area and an isocenter was placed near the chest wall at the upper aspect of the right chest.  The patient will be planned to receive a course of radiation initially to a dose of 50.4 gray. This will consist of a 4 field technique targeting the chest wall as well as the supraclavicular region. Therefore 2 customized medial and lateral tangent fields have been created targeting the chest wall, and also 2 additional customized fields have been designed to treat the supraclavicular region both with a right supraclavicular field and a right posterior axillary boost field. This treatment will be accomplished at 1.8 gray per fraction. A complex isodose plan is requested to ensure that the target area is adequately covered dosimetrically. A forward planning technique will also be evaluated to determine if this approach improves the plan. It is anticipated that the patient will then receive a 10 gray boost to the scar plus margin. This will be accomplished at 2 gray per fraction. The final anticipated total dose therefore will correspond to 60.4 gray.    _______________________________   Radene Gunning, MD, PhD

## 2012-10-28 ENCOUNTER — Ambulatory Visit: Payer: Medicaid Other

## 2012-10-29 ENCOUNTER — Ambulatory Visit
Admission: RE | Admit: 2012-10-29 | Discharge: 2012-10-29 | Disposition: A | Discharge status: Home / Self Care | Payer: Medicaid Other | Source: Ambulatory Visit | Attending: Radiation Oncology | Admitting: Radiation Oncology

## 2012-10-29 ENCOUNTER — Ambulatory Visit: Payer: Medicaid Other | Admitting: Radiation Oncology

## 2012-10-29 DIAGNOSIS — C50411 Malignant neoplasm of upper-outer quadrant of right female breast: Secondary | ICD-10-CM

## 2012-10-29 NOTE — Progress Notes (Signed)
  Radiation Oncology         [phone] ________________________________  Name: Tasha Ortiz MRN: [id number]  Date: 10/29/2012  DOB: January 13, 1956  Simulation Verification Note   NARRATIVE: The patient was brought to the treatment unit and placed in the planned treatment position. The clinical setup was verified. Then port films were obtained and uploaded to the radiation oncology medical record software.  The treatment beams were carefully compared against the planned radiation fields. The position, location, and shape of the radiation fields was reviewed. The targeted volume of tissue appears to be appropriately covered by the radiation beams. Based on my personal review, I approved the simulation verification. The patient's treatment will proceed as planned.  ________________________________   Radene Gunning, MD, PhD

## 2012-10-30 ENCOUNTER — Ambulatory Visit
Admission: RE | Admit: 2012-10-30 | Discharge: 2012-10-30 | Disposition: A | Discharge status: Home / Self Care | Payer: Medicaid Other | Source: Ambulatory Visit | Attending: Radiation Oncology | Admitting: Radiation Oncology

## 2012-10-31 ENCOUNTER — Ambulatory Visit
Admission: RE | Admit: 2012-10-31 | Discharge: 2012-10-31 | Disposition: A | Discharge status: Home / Self Care | Payer: Medicaid Other | Source: Ambulatory Visit | Attending: Radiation Oncology | Admitting: Radiation Oncology

## 2012-10-31 ENCOUNTER — Encounter: Payer: Self-pay | Admitting: Radiation Oncology

## 2012-10-31 VITALS — BP 102/59 | HR 83 | Resp 16 | Wt 191.9 lb

## 2012-10-31 DIAGNOSIS — Z923 Personal history of irradiation: Secondary | ICD-10-CM

## 2012-10-31 DIAGNOSIS — C50411 Malignant neoplasm of upper-outer quadrant of right female breast: Secondary | ICD-10-CM

## 2012-10-31 DIAGNOSIS — C50412 Malignant neoplasm of upper-outer quadrant of left female breast: Secondary | ICD-10-CM

## 2012-10-31 HISTORY — DX: Personal history of irradiation: Z92.3

## 2012-10-31 NOTE — Progress Notes (Addendum)
Denies skin changes. Denies fatigue. Oriented patient to staff and routine of the clinic. Provided patient with radiaplex and alra then, educated upon use. Provided patient with RADIATION THERAPY AND YOU handbook then, reviewed pertinent information. Educated patient reference potential side effects and management such as, skin changes and fatigue. All questions answered.Patient verbalized understanding.

## 2012-10-31 NOTE — Progress Notes (Signed)
   Department of Radiation Oncology  Phone:  [phone] Fax:        [phone]  Weekly Treatment Note    Name: Tasha Ortiz Date: 10/31/2012 MRN: [id number] DOB: Sep 08, 1955   Current dose: 3.6 Gy  Current fraction: 2   MEDICATIONS: Current Outpatient Prescriptions  Medication Sig Dispense Refill  . acetaminophen (TYLENOL) 325 MG tablet Take 2 tablets (650 mg total) by mouth every 6 (six) hours as needed for pain.  60 tablet  2  . B Complex-C (SUPER B COMPLEX PO) Take 1 tablet by mouth daily.      . famotidine (PEPCID) 20 MG tablet Take 20 mg by mouth 2 (two) times daily.      Marland Kitchen guaiFENesin (MUCINEX) 600 MG 12 hr tablet Take 1,200 mg by mouth 2 (two) times daily as needed. For cough      . lisinopril-hydrochlorothiazide (PRINZIDE,ZESTORETIC) 20-25 MG per tablet Take 1 tablet by mouth daily.      . non-metallic deodorant Thornton Papas) MISC Apply 1 application topically daily as needed.      . pravastatin (PRAVACHOL) 40 MG tablet Take 40 mg by mouth daily.      . sodium chloride (OCEAN) 0.65 % nasal spray Place 4 sprays into the nose daily as needed. For congestion.      . Wound Cleansers (RADIAPLEX EX) Apply topically.      . [DISCONTINUED] Prochlorperazine Maleate (COMPAZINE PO) Take by mouth.       No current facility-administered medications for this encounter.     ALLERGIES: Review of patient's allergies indicates no known allergies.   LABORATORY DATA:  Lab Results  Component Value Date   WBC 13.2* 10/14/2012   HGB 10.7* 10/14/2012   HCT 32.1* 10/14/2012   MCV 90.9 10/14/2012   PLT 196 10/14/2012   Lab Results  Component Value Date   NA 141 10/14/2012   K 3.8 10/14/2012   CL 103 09/23/2012   CO2 23 10/14/2012   Lab Results  Component Value Date   ALT 22 10/14/2012   AST 18 10/14/2012   ALKPHOS 81 10/14/2012   BILITOT 0.25 10/14/2012     NARRATIVE: Tasha Ortiz was seen today for weekly treatment management. The chart was checked and the patient's films were  reviewed. The patient is doing very well. No problems with treatment so 4. No side effects.  PHYSICAL EXAMINATION: weight is 191 lb 14.4 oz (87.045 kg). Her blood pressure is 102/59 and her pulse is 83. Her respiration is 16.        ASSESSMENT: The patient is doing satisfactorily with treatment.  PLAN: We will continue with the patient's radiation treatment as planned.

## 2012-11-03 ENCOUNTER — Ambulatory Visit
Admission: RE | Admit: 2012-11-03 | Discharge: 2012-11-03 | Disposition: A | Discharge status: Home / Self Care | Payer: Medicaid Other | Source: Ambulatory Visit | Attending: Radiation Oncology | Admitting: Radiation Oncology

## 2012-11-03 MED ORDER — RADIAPLEXRX EX GEL
Freq: Once | CUTANEOUS | Status: AC
  Administered 2012-11-03: 09:00:00 via TOPICAL

## 2012-11-03 MED ORDER — ALRA NON-METALLIC DEODORANT (RAD-ONC)
1.0000 "application " | Freq: Once | TOPICAL | Status: AC
  Administered 2012-11-03: 1 via TOPICAL

## 2012-11-03 NOTE — Addendum Note (Signed)
Encounter addended by: Agnes Lawrence, RN on: 11/03/2012  8:56 AM<BR>     Documentation filed: Chief Complaint Section, Notes Section, Inpatient Patient Education, Inpatient Document Flowsheet, Inpatient MAR, Orders

## 2012-11-04 ENCOUNTER — Ambulatory Visit
Admission: RE | Admit: 2012-11-04 | Discharge: 2012-11-04 | Disposition: A | Discharge status: Home / Self Care | Payer: Medicaid Other | Source: Ambulatory Visit | Attending: Radiation Oncology | Admitting: Radiation Oncology

## 2012-11-05 ENCOUNTER — Ambulatory Visit
Admission: RE | Admit: 2012-11-05 | Discharge: 2012-11-05 | Disposition: A | Discharge status: Home / Self Care | Payer: Medicaid Other | Source: Ambulatory Visit | Attending: Radiation Oncology | Admitting: Radiation Oncology

## 2012-11-05 ENCOUNTER — Ambulatory Visit: Payer: No Typology Code available for payment source

## 2012-11-05 NOTE — Progress Notes (Signed)
Tasha Ortiz here for weekly under treat visit.  She has had 5 fractions to her right chest.  She does have pain in her feet that she is rating at a 4.  She reports itching of the skin on her right chest.  She does have hyperpigmentation on her right chest. She has been using radiaplex gel twice a day.  She does have fatigue.  She is also concerned about weight gain and was asking if radiation causes weight gain.  Advised her that is doesn't and that it may be related to her chemotherapy. 

## 2012-11-05 NOTE — Progress Notes (Deleted)
Tasha Ortiz here for weekly under treat visit.  She has had 5 fractions to her right chest.  She does have pain in her feet that she is rating at a 4.  She reports itching of the skin on her right chest.  She does have hyperpigmentation on her right chest. She has been using radiaplex gel twice a day.  She does have fatigue.  She is also concerned about weight gain and was asking if radiation causes weight gain.  Advised her that is doesn't and that it may be related to her chemotherapy.

## 2012-11-06 ENCOUNTER — Ambulatory Visit
Admission: RE | Admit: 2012-11-06 | Discharge: 2012-11-06 | Disposition: A | Discharge status: Home / Self Care | Payer: Medicaid Other | Source: Ambulatory Visit | Attending: Radiation Oncology | Admitting: Radiation Oncology

## 2012-11-06 DIAGNOSIS — C50412 Malignant neoplasm of upper-outer quadrant of left female breast: Secondary | ICD-10-CM

## 2012-11-06 NOTE — Progress Notes (Signed)
   Department of Radiation Oncology  Phone:  [phone] Fax:        [phone]  Weekly Treatment Note    Name: Tasha Ortiz Date: 11/06/2012 MRN: [id number] DOB: 08/04/1955     Current fraction: 6   MEDICATIONS: Current Outpatient Prescriptions  Medication Sig Dispense Refill  . acetaminophen (TYLENOL) 325 MG tablet Take 2 tablets (650 mg total) by mouth every 6 (six) hours as needed for pain.  60 tablet  2  . B Complex-C (SUPER B COMPLEX PO) Take 1 tablet by mouth daily.      . famotidine (PEPCID) 20 MG tablet Take 20 mg by mouth 2 (two) times daily.      Marland Kitchen guaiFENesin (MUCINEX) 600 MG 12 hr tablet Take 1,200 mg by mouth 2 (two) times daily as needed. For cough      . lisinopril-hydrochlorothiazide (PRINZIDE,ZESTORETIC) 20-25 MG per tablet Take 1 tablet by mouth daily.      . non-metallic deodorant Thornton Papas) MISC Apply 1 application topically daily as needed.      . pravastatin (PRAVACHOL) 40 MG tablet Take 40 mg by mouth daily.      . sodium chloride (OCEAN) 0.65 % nasal spray Place 4 sprays into the nose daily as needed. For congestion.      . Wound Cleansers (RADIAPLEX EX) Apply topically.      . [DISCONTINUED] Prochlorperazine Maleate (COMPAZINE PO) Take by mouth.       No current facility-administered medications for this encounter.     ALLERGIES: Review of patient's allergies indicates no known allergies.   LABORATORY DATA:  Lab Results  Component Value Date   WBC 13.2* 10/14/2012   HGB 10.7* 10/14/2012   HCT 32.1* 10/14/2012   MCV 90.9 10/14/2012   PLT 196 10/14/2012   Lab Results  Component Value Date   NA 141 10/14/2012   K 3.8 10/14/2012   CL 103 09/23/2012   CO2 23 10/14/2012   Lab Results  Component Value Date   ALT 22 10/14/2012   AST 18 10/14/2012   ALKPHOS 81 10/14/2012   BILITOT 0.25 10/14/2012     NARRATIVE: Tasha Ortiz was seen today for weekly treatment management. The chart was checked and the patient's films were reviewed. The patient is  doing very well. No complaints so far.  PHYSICAL EXAMINATION:  Slight possible hyperpigmentation, no erythema.  ASSESSMENT: The patient is doing satisfactorily with treatment.  PLAN: We will continue with the patient's radiation treatment as planned.

## 2012-11-07 ENCOUNTER — Ambulatory Visit
Admission: RE | Admit: 2012-11-07 | Discharge: 2012-11-07 | Disposition: A | Discharge status: Home / Self Care | Payer: Medicaid Other | Source: Ambulatory Visit | Attending: Radiation Oncology | Admitting: Radiation Oncology

## 2012-11-10 ENCOUNTER — Ambulatory Visit
Admission: RE | Admit: 2012-11-10 | Discharge: 2012-11-10 | Disposition: A | Discharge status: Home / Self Care | Payer: Medicaid Other | Source: Ambulatory Visit | Attending: Radiation Oncology | Admitting: Radiation Oncology

## 2012-11-11 ENCOUNTER — Other Ambulatory Visit: Payer: Self-pay | Admitting: *Deleted

## 2012-11-11 ENCOUNTER — Ambulatory Visit: Payer: No Typology Code available for payment source

## 2012-11-11 ENCOUNTER — Ambulatory Visit
Admission: RE | Admit: 2012-11-11 | Discharge: 2012-11-11 | Disposition: A | Discharge status: Home / Self Care | Payer: Medicaid Other | Source: Ambulatory Visit | Attending: Radiation Oncology | Admitting: Radiation Oncology

## 2012-11-11 ENCOUNTER — Encounter: Payer: Self-pay | Admitting: Oncology

## 2012-11-11 ENCOUNTER — Ambulatory Visit (HOSPITAL_BASED_OUTPATIENT_CLINIC_OR_DEPARTMENT_OTHER): Payer: Medicaid Other | Admitting: Oncology

## 2012-11-11 ENCOUNTER — Other Ambulatory Visit (HOSPITAL_BASED_OUTPATIENT_CLINIC_OR_DEPARTMENT_OTHER): Payer: Medicaid Other | Admitting: Lab

## 2012-11-11 ENCOUNTER — Telehealth: Payer: Self-pay | Admitting: Oncology

## 2012-11-11 VITALS — BP 124/83 | HR 80 | Temp 98.1°F | Resp 18 | Ht 63.0 in | Wt 191.6 lb

## 2012-11-11 DIAGNOSIS — C50419 Malignant neoplasm of upper-outer quadrant of unspecified female breast: Secondary | ICD-10-CM

## 2012-11-11 DIAGNOSIS — C50911 Malignant neoplasm of unspecified site of right female breast: Secondary | ICD-10-CM

## 2012-11-11 DIAGNOSIS — C773 Secondary and unspecified malignant neoplasm of axilla and upper limb lymph nodes: Secondary | ICD-10-CM

## 2012-11-11 DIAGNOSIS — C50412 Malignant neoplasm of upper-outer quadrant of left female breast: Secondary | ICD-10-CM

## 2012-11-11 DIAGNOSIS — Z9010 Acquired absence of unspecified breast and nipple: Secondary | ICD-10-CM

## 2012-11-11 LAB — CBC WITH DIFFERENTIAL/PLATELET
BASO%: 1 % (ref 0.0–2.0)
Basophils Absolute: 0 10*3/uL (ref 0.0–0.1)
EOS%: 3 % (ref 0.0–7.0)
Eosinophils Absolute: 0.1 10*3/uL (ref 0.0–0.5)
HCT: 33.4 % — ABNORMAL LOW (ref 34.8–46.6)
HGB: 11.2 g/dL — ABNORMAL LOW (ref 11.6–15.9)
LYMPH%: 20.1 % (ref 14.0–49.7)
MCH: 30.4 pg (ref 25.1–34.0)
MCHC: 33.5 g/dL (ref 31.5–36.0)
MCV: 90.8 fL (ref 79.5–101.0)
MONO#: 0.4 10*3/uL (ref 0.1–0.9)
MONO%: 13.2 % (ref 0.0–14.0)
NEUT#: 1.7 10*3/uL (ref 1.5–6.5)
NEUT%: 62.7 % (ref 38.4–76.8)
Platelets: 252 10*3/uL (ref 145–400)
RBC: 3.68 10*6/uL — ABNORMAL LOW (ref 3.70–5.45)
RDW: 15 % — ABNORMAL HIGH (ref 11.2–14.5)
WBC: 2.8 10*3/uL — ABNORMAL LOW (ref 3.9–10.3)
lymph#: 0.6 10*3/uL — ABNORMAL LOW (ref 0.9–3.3)

## 2012-11-11 MED ORDER — HEPARIN SOD (PORK) LOCK FLUSH 100 UNIT/ML IV SOLN
500.0000 [IU] | Freq: Once | INTRAVENOUS | Status: AC
  Administered 2012-11-11: 500 [IU] via INTRAVENOUS
  Filled 2012-11-11: qty 5

## 2012-11-11 MED ORDER — SODIUM CHLORIDE 0.9 % IJ SOLN
10.0000 mL | INTRAMUSCULAR | Status: DC | PRN
  Administered 2012-11-11: 10 mL via INTRAVENOUS
  Filled 2012-11-11: qty 10

## 2012-11-11 NOTE — Patient Instructions (Addendum)
Continue radiation  I will see you back in October to begin anti-estrogen therapy to help prevent recurrence of the breast cancer

## 2012-11-11 NOTE — Progress Notes (Signed)
OFFICE PROGRESS NOTE  CCGildardo Cranker, DO [address] [place] [address]  DIAGNOSIS: 57 year old female with stage IIB right breast cancer, ER positive, PR positive, HER-2/neu negative. Node positive   PRIOR THERAPY:  #1 .Who was seen in the multidisciplinary breast clinic for consultation. Patient noted a mass in the right breast about 4 months ago. She's had a mammogram every year on an annual basis. On her prior mammogram there were no suspicious findings. But because of the mass she presented for workup for the palpable mass. On mammogram there was noted to be an abnormality at the 11:00 position in the right breast corresponding to the palpable abnormality. There were also noted to be 2 additional suspicious masses within the right breast as well as abnormal right axillary lymph nodes. Patient went on to have an ultrasound-guided core needle biopsy of all 3 masses and the axillary lymph node. All 3 masses were positive for invasive ductal carcinoma. 2 primaries appear to be HER-2/neu negative ER positive PR +1 of the primaries demonstrated mucinous features with Ki-67 20%. The lymph node had a higher Ki-67 of 70%. Indicating there were 2 synchronous primaries. Patient underwent MRI of the breasts. Again 3 lesions were noted within the right breast. The larger mass was at the 10:00 position measuring 2.3 cm. An abnormal level I right axillary lymph node measures 3.0 cm. No abnormalities in the contralateral breast   #2 Patient is now status post right modified radical mastectomy. She was found to have multifocal disease. Tumor was ER PR positive HER-2/neu negative. She had one of 17 lymph nodes positive for metastatic disease. We discussed her pathology in detail today.   #3 patient began adjuvant chemotherapy consisting of Taxotere Cytoxan every 6 weeks with day 2 Neulasta. She has received a total of 6 cycles from 06/24/2012 through 10/07/2012.  #4 patient is scheduled to be  seen by Dr. Dorothy Puffer on 10/16/2012 for consideration of adjuvant radiation therapy.   CURRENT THERAPY: radiation  INTERVAL HISTORY: Shauntea Lok 57 y.o. female returns for f/u Overall she is doing well without any problems. She denies any fevers chills night sweats headaches shortness of breath chest pains palpitations. She does need her nausea medications refilled which I have done. She is also experiencing some lower extremity swelling which is minimal. This is most likely due to the steroids and the chemotherapy itself. I have recommended she keep her feet elevated. She has no peripheral paresthesias no easy bruising remainder of the 10 point review of systems is negative.  MEDICAL HISTORY: Past Medical History  Diagnosis Date  . Hypertension   . Hypercholesteremia     taken off chol meds Dec 2012  . Acid reflux   . Breast cancer   . Heartburn     ALLERGIES:  has No Known Allergies.  MEDICATIONS:  Current Outpatient Prescriptions  Medication Sig Dispense Refill  . acetaminophen (TYLENOL) 325 MG tablet Take 2 tablets (650 mg total) by mouth every 6 (six) hours as needed for pain.  60 tablet  2  . B Complex-C (SUPER B COMPLEX PO) Take 1 tablet by mouth daily.      . famotidine (PEPCID) 20 MG tablet Take 20 mg by mouth 2 (two) times daily.      Marland Kitchen guaiFENesin (MUCINEX) 600 MG 12 hr tablet Take 1,200 mg by mouth 2 (two) times daily as needed. For cough      . lisinopril-hydrochlorothiazide (PRINZIDE,ZESTORETIC) 20-25 MG per tablet Take  1 tablet by mouth daily.      . non-metallic deodorant Thornton Papas) MISC Apply 1 application topically daily as needed.      . pravastatin (PRAVACHOL) 40 MG tablet Take 40 mg by mouth daily.      . sodium chloride (OCEAN) 0.65 % nasal spray Place 4 sprays into the nose daily as needed. For congestion.      . Wound Cleansers (RADIAPLEX EX) Apply topically.      . [DISCONTINUED] Prochlorperazine Maleate (COMPAZINE PO) Take by mouth.       No current  facility-administered medications for this visit.   Facility-Administered Medications Ordered in Other Visits  Medication Dose Route Frequency Provider Last Rate Last Dose  . sodium chloride 0.9 % injection 10 mL  10 mL Intravenous PRN Victorino December, MD   10 mL at 11/11/12 0857    SURGICAL HISTORY:  Past Surgical History  Procedure Laterality Date  . Cesarean section  25 years ago  . Tubal ligation    . Mastectomy Right   . Modified mastectomy Right 05/20/2012    Procedure: MODIFIED MASTECTOMY;  Surgeon: Mariella Saa, MD;  Location: [facility] OR;  Service: General;  Laterality: Right;  . Portacath placement Left 05/20/2012    Procedure: INSERTION PORT-A-CATH;  Surgeon: Mariella Saa, MD;  Location: [facility] OR;  Service: General;  Laterality: Left;  . Breast surgery Right 05/20/12    Rt br mastectomy    REVIEW OF SYSTEMS:  General: fatigue (+), night sweats (-), fever (-), pain (-) Lymph: palpable nodes (-) HEENT: vision changes (-), mucositis (-), gum bleeding (-), epistaxis (-) Cardiovascular: chest pain (-), palpitations (-) Pulmonary: shortness of breath (-), dyspnea on exertion (-), cough (-), hemoptysis (-) GI:  Early satiety (-), melena (-), dysphagia (-), nausea/vomiting (+), diarrhea (-) GU: dysuria (-), hematuria (-), incontinence (-) Musculoskeletal: joint swelling (-), joint pain (-), back pain (-) Neuro: weakness (-), numbness (-), headache (-), confusion (-) Skin: Rash (-), lesions (-), dryness (-) Psych: depression (-), suicidal/homicidal ideation (-), feeling of hopelessness (-)   PHYSICAL EXAMINATION: Blood pressure 124/83, pulse 80, temperature 98.1 F (36.7 C), temperature source Oral, resp. rate 18, height 5\' 3"  (1.6 m), weight 191 lb 9.6 oz (86.909 kg), last menstrual period 08/16/2012. Body mass index is 33.95 kg/(m^2). General: Patient is a well appearing female in no acute distress HEENT: PERRLA, sclerae anicteric no conjunctival pallor, MMM Neck: supple,  no palpable adenopathy Lungs: clear to auscultation bilaterally, no wheezes, rhonchi, or rales Cardiovascular: regular rate rhythm, S1, S2, no murmurs, rubs or gallops Abdomen: Soft, non-tender, non-distended, normoactive bowel sounds, no HSM Extremities: warm and well perfused, no clubbing, cyanosis, or edema Skin: No rashes or lesions Neuro: Non-focal Breasts: right mastecomy site well healed, no nodularity, left breast, no masses, nodules or skin changes noted ECOG PERFORMANCE STATUS: 1 - Symptomatic but completely ambulatory      LABORATORY DATA: Lab Results  Component Value Date   WBC 2.8* 11/11/2012   HGB 11.2* 11/11/2012   HCT 33.4* 11/11/2012   MCV 90.8 11/11/2012   PLT 252 11/11/2012      Chemistry      Component Value Date/Time   NA 141 10/14/2012 1043   NA 138 05/21/2012 0515   K 3.8 10/14/2012 1043   K 4.2 05/21/2012 0515   CL 103 09/23/2012 1040   CL 104 05/21/2012 0515   CO2 23 10/14/2012 1043   CO2 25 05/21/2012 0515   BUN 11.5 10/14/2012 1043  BUN 10 05/21/2012 0515   CREATININE 1.0 10/14/2012 1043   CREATININE 0.89 05/21/2012 0515   CREATININE 0.85 03/10/2012 1544      Component Value Date/Time   CALCIUM 9.8 10/14/2012 1043   CALCIUM 9.1 05/21/2012 0515   ALKPHOS 81 10/14/2012 1043   ALKPHOS 47 04/06/2012 2306   AST 18 10/14/2012 1043   AST 21 04/06/2012 2306   ALT 22 10/14/2012 1043   ALT 16 04/06/2012 2306   BILITOT 0.25 10/14/2012 1043   BILITOT 0.2* 04/06/2012 2306    REASON FOR ADDENDUM, AMENDMENT OR CORRECTION: [id number]: Modification of the tumor staging. 05/28/12 08:14:17 AM (gt) ADDITIONAL INFORMATION: CHROMOGENIC IN-SITU HYBRIDIZATION (Block 1G) Interpretation HER-2/NEU BY CISH - NO AMPLIFICATION OF HER-2 DETECTED. THE RATIO OF HER-2: CEP 17 SIGNALS WAS 0.93. Reference range: Ratio: HER2:CEP17 < 1.8 - gene amplification not observed Ratio: HER2:CEP 17 1.8-2.2 - equivocal result Ratio: HER2:CEP17 > 2.2 - gene amplification observed Jimmy Picket  MD Pathologist, Electronic Signature ( Signed 05/28/2012) CHROMOGENIC IN-SITU HYBRIDIZATION (Block 1D) Interpretation HER-2/NEU BY CISH - NO AMPLIFICATION OF HER-2 DETECTED. THE RATIO OF HER-2: CEP 17 SIGNALS WAS 1.15. Reference range: Ratio: HER2:CEP17 < 1.8 - gene amplification not observed Ratio: HER2:CEP 17 1.8-2.2 - equivocal result Ratio: HER2:CEP17 > 2.2 - gene amplification observed Jimmy Picket MD 1 of 4 Amended copy Amended FINAL for SPANGLE, [place] [phone]) ADDITIONAL INFORMATION:(continued) Pathologist, Electronic Signature ( Signed 05/28/2012) CHROMOGENIC IN-SITU HYBRIDIZATION (Block 1A) Interpretation HER-2/NEU BY CISH - NO AMPLIFICATION OF HER-2 DETECTED. THE RATIO OF HER-2: CEP 17 SIGNALS WAS 1.28. Reference range: Ratio: HER2:CEP17 < 1.8 - gene amplification not observed Ratio: HER2:CEP 17 1.8-2.2 - equivocal result Ratio: HER2:CEP17 > 2.2 - gene amplification observed Jimmy Picket MD Pathologist, Electronic Signature ( Signed 05/28/2012) FINAL DIAGNOSIS Diagnosis Breast, modified radical mastectomy , Right - TUMOR #1 (COIL CLIP) - INVASIVE DUCTAL CARCINOMA, GRADE II (1.9 CM), SEE COMMENT. - INVASIVE CARCINOMA IS 4 CM FROM NEAREST MARGIN (DEEP). - DUCTAL CARCINOMA IN SITU, GRADE III WITH COMEDO NECROSIS AND CALCIFICATIONS. - TUMOR #2 (RIBBON CLIP) - INVASIVE DUCTAL CARCINOMA WITH EXTRAVASATED MUCIN (COLLOID CARCINOMA) (2.5 CM), SEE COMMENT. - INVASIVE TUMOR IS 3 CM FROM NEAREST MARGIN (DEEP). - TUMOR #3 (WING CLIP) - INVASIVE DUCTAL CARCINOMA WITH EXTRAVASATED MUCIN (COLLOID CARCINOMA) (1.8 CM). - INVASIVE TUMOR IS 1.8 CM FROM NEAREST MARGIN (DEEP). - ONE LYMPH NODE, POSITIVE FOR METASTATIC MAMMARY CARCINOMA (1/17). Microscopic Comment BREAST, INVASIVE TUMOR, WITH LYMPH NODE SAMPLING Specimen, including laterality: Right breast Procedure: Modified radial mastectomy Grade: #1 = Grade II; #2 = Grade III; #3 = Grade III Tubule formation:  2;3;3 Nuclear pleomorphism: 3;3;3 Mitotic: 2;2;2 Tumor size (gross measurement): Tumor #1 = 1.9 cm; tumor #2 = 3.0 cm; tumor #3 = 1.8 cm 2 of 4 Amended copy Amended FINAL for Tasha Ortiz, Tasha Ortiz [phone]) Microscopic Comment(continued) Margins: Invasive, distance to closest margin: 4 cm; 3 cm; 1.8 cm In-situ, distance to closest margin: 4.0 cm; N/A; N/A If margin positive, focally or broadly: N/A Lymphovascular invasion: Present Ductal carcinoma in situ: Present Grade: III Extensive intraductal component: Absent Lobular neoplasia: Absent Tumor focality: Multifocal Treatment effect: None If present, treatment effect in breast tissue, lymph nodes or both: N/A Extent of tumor: Skin: Grossly negative Nipple: Grossly negative Skeletal muscle: N/A Lymph nodes: # examined: 17 Lymph nodes with metastasis: 1 Isolated tumor cells (< 0.2 mm): 0 Micrometastasis: (> 0.2 mm and < 2.0 mm): 0 Macrometastasis: (> 2.0 mm): At least 1.7 cm, see comment Extracapsular extension: Absent see comment  Breast prognostic profile: Estrogen receptor: Not repeated, previous studies demonstrated 100% and 100% of positivity ([id number]) Progesterone receptor: Not repeated, previous studies demonstrated 100% and 99% of positivity ([id number]) Her 2 neu: Repeated, previous study demonstrated no amplification (0.94 and 1.28) ([id number]) Ki-67: Not repeated, previous studies demonstrated 19% and 77% proliferation rate ([id number]) Non-neoplastic breast: Fibrocystic change, pseudoangiomatous hyperplasia, microcalcifications, and fibroadenomatoid nodules. TNM: mpT2, pN1a, pMX Comments: The largest lymph node (3 cm) is the one involved by metastatic mammary carcinoma (slide 1V). The lymph node was submitted in multiple sections for review. The metastatic tumor deposits range from 1.4 to 1.7 cm in these sections. Definitive evidence of extracapsular invasion is not identified. (CRR:caf 05/21/12) [place] RUND  DO Pathologist, Electronic Signature   RADIOGRAPHIC STUDIES:  No results found.  ASSESSMENT: 57 year old female with  #1 stage II a multifocal invasive ductal carcinoma of the right breast status post mastectomy with axillary lymph node dissection.  Her final pathology revealed multifocal disease with the largest tumor mass measuring 3.0 cm. One of 17 lymph nodes was positive for metastatic disease.  #2 patient then again adjuvant chemotherapy consisting of Taxotere and Cytoxan given every 3 weeks starting on 06/24/2012 through 10/07/2012. She completed a total of 6 cycles of chemotherapy. She did develop hypokalemia during her therapy as well as lower extremity swelling.  #3 patient is also referred to radiation oncology for consideration of postmastectomy radiation due to multiple focality of the disease as well as positive lymph node.   PLAN:  #1patient will continue with radiation therapy.  #2 I will see her back in October to begin antiestrogen therapy.   All questions were answered. The patient knows to call the clinic with any problems, questions or concerns. We can certainly see the patient much sooner if necessary.  I spent 15 minutes counseling the patient face to face. The total time spent in the appointment was 30 minutes.  Drue Second, MD Medical/Oncology [facility] [phone] (beeper) [phone] (Office)  11/11/2012, 9:12 AM

## 2012-11-12 ENCOUNTER — Ambulatory Visit: Payer: No Typology Code available for payment source | Attending: Oncology

## 2012-11-12 ENCOUNTER — Ambulatory Visit
Admission: RE | Admit: 2012-11-12 | Discharge: 2012-11-12 | Disposition: A | Discharge status: Home / Self Care | Payer: Medicaid Other | Source: Ambulatory Visit | Attending: Radiation Oncology | Admitting: Radiation Oncology

## 2012-11-12 DIAGNOSIS — I890 Lymphedema, not elsewhere classified: Secondary | ICD-10-CM | POA: Insufficient documentation

## 2012-11-12 DIAGNOSIS — M24519 Contracture, unspecified shoulder: Secondary | ICD-10-CM | POA: Insufficient documentation

## 2012-11-12 DIAGNOSIS — IMO0001 Reserved for inherently not codable concepts without codable children: Secondary | ICD-10-CM | POA: Insufficient documentation

## 2012-11-12 NOTE — Progress Notes (Signed)
Tasha Ortiz here for weekly under treat visit.  She has had 10 fractions to her right breast.  She denies fatigue.  The skin on her right chest has some hyperpigmentation.  She is using radiaplex gel twice a day.

## 2012-11-13 ENCOUNTER — Ambulatory Visit
Admission: RE | Admit: 2012-11-13 | Discharge: 2012-11-13 | Disposition: A | Discharge status: Home / Self Care | Payer: Medicaid Other | Source: Ambulatory Visit | Attending: Radiation Oncology | Admitting: Radiation Oncology

## 2012-11-14 ENCOUNTER — Ambulatory Visit
Admission: RE | Admit: 2012-11-14 | Discharge: 2012-11-14 | Disposition: A | Discharge status: Home / Self Care | Payer: Medicaid Other | Source: Ambulatory Visit | Attending: Radiation Oncology | Admitting: Radiation Oncology

## 2012-11-17 ENCOUNTER — Ambulatory Visit
Admission: RE | Admit: 2012-11-17 | Discharge: 2012-11-17 | Disposition: A | Discharge status: Home / Self Care | Payer: Medicaid Other | Source: Ambulatory Visit | Attending: Radiation Oncology | Admitting: Radiation Oncology

## 2012-11-18 ENCOUNTER — Ambulatory Visit
Admission: RE | Admit: 2012-11-18 | Discharge: 2012-11-18 | Disposition: A | Discharge status: Home / Self Care | Payer: Medicaid Other | Source: Ambulatory Visit | Attending: Radiation Oncology | Admitting: Radiation Oncology

## 2012-11-18 VITALS — BP 115/74 | HR 74 | Temp 98.0°F | Ht 63.0 in | Wt 181.3 lb

## 2012-11-18 DIAGNOSIS — C50412 Malignant neoplasm of upper-outer quadrant of left female breast: Secondary | ICD-10-CM

## 2012-11-18 MED ORDER — RADIAPLEXRX EX GEL
Freq: Once | CUTANEOUS | Status: AC
  Administered 2012-11-18: 15:00:00 via TOPICAL

## 2012-11-18 NOTE — Progress Notes (Signed)
   Department of Radiation Oncology  Phone:  [phone] Fax:        [phone]  Weekly Treatment Note    Name: Tasha Ortiz Date: 11/18/2012 MRN: [id number] DOB: 12/27/1955   Current dose: 25.2 Gy  Current fraction: 14   MEDICATIONS: Current Outpatient Prescriptions  Medication Sig Dispense Refill  . acetaminophen (TYLENOL) 325 MG tablet Take 2 tablets (650 mg total) by mouth every 6 (six) hours as needed for pain.  60 tablet  2  . B Complex-C (SUPER B COMPLEX PO) Take 1 tablet by mouth daily.      . famotidine (PEPCID) 20 MG tablet Take 20 mg by mouth 2 (two) times daily.      Marland Kitchen guaiFENesin (MUCINEX) 600 MG 12 hr tablet Take 1,200 mg by mouth 2 (two) times daily as needed. For cough      . lisinopril-hydrochlorothiazide (PRINZIDE,ZESTORETIC) 20-25 MG per tablet Take 1 tablet by mouth daily.      . non-metallic deodorant Thornton Papas) MISC Apply 1 application topically daily as needed.      . pravastatin (PRAVACHOL) 40 MG tablet Take 40 mg by mouth daily.      . sodium chloride (OCEAN) 0.65 % nasal spray Place 4 sprays into the nose daily as needed. For congestion.      . Wound Cleansers (RADIAPLEX EX) Apply topically.      . [DISCONTINUED] Prochlorperazine Maleate (COMPAZINE PO) Take by mouth.       No current facility-administered medications for this encounter.     ALLERGIES: Review of patient's allergies indicates no known allergies.   LABORATORY DATA:  Lab Results  Component Value Date   WBC 2.8* 11/11/2012   HGB 11.2* 11/11/2012   HCT 33.4* 11/11/2012   MCV 90.8 11/11/2012   PLT 252 11/11/2012   Lab Results  Component Value Date   NA 141 10/14/2012   K 3.8 10/14/2012   CL 103 09/23/2012   CO2 23 10/14/2012   Lab Results  Component Value Date   ALT 22 10/14/2012   AST 18 10/14/2012   ALKPHOS 81 10/14/2012   BILITOT 0.25 10/14/2012     NARRATIVE: Tasha Ortiz was seen today for weekly treatment management. The chart was checked and the patient's films were  reviewed. The patient is doing well at this time. She is using skin cream daily. No difficulty with pain or fatigue.  PHYSICAL EXAMINATION: height is 5\' 3"  (1.6 m) and weight is 181 lb 4.8 oz (82.237 kg). Her temperature is 98 F (36.7 C). Her blood pressure is 115/74 and her pulse is 74.      minimal hyperpigmentation present  ASSESSMENT: The patient is doing satisfactorily with treatment.  PLAN: We will continue with the patient's radiation treatment as planned.

## 2012-11-18 NOTE — Progress Notes (Signed)
Tasha Ortiz here for weekly under treat.  She has had 14 fractions to her right chest.  She denies pain and fatigue.  The skin on her right chest is intact with hyperpigmentation.  She requested a refill on her radiaplex.  Another tube has been given.  She has lost 8 lbs since last week.  She has been trying to lose weight and has stopped eating pork and has limited her salt intake.

## 2012-11-19 ENCOUNTER — Ambulatory Visit
Admission: RE | Admit: 2012-11-19 | Discharge: 2012-11-19 | Disposition: A | Discharge status: Home / Self Care | Payer: Medicaid Other | Source: Ambulatory Visit | Attending: Radiation Oncology | Admitting: Radiation Oncology

## 2012-11-20 ENCOUNTER — Ambulatory Visit
Admission: RE | Admit: 2012-11-20 | Discharge: 2012-11-20 | Disposition: A | Discharge status: Home / Self Care | Payer: Medicaid Other | Source: Ambulatory Visit | Attending: Radiation Oncology | Admitting: Radiation Oncology

## 2012-11-20 ENCOUNTER — Other Ambulatory Visit: Payer: Self-pay | Admitting: Family Medicine

## 2012-11-21 ENCOUNTER — Ambulatory Visit
Admission: RE | Admit: 2012-11-21 | Discharge: 2012-11-21 | Disposition: A | Discharge status: Home / Self Care | Payer: Medicaid Other | Source: Ambulatory Visit | Attending: Radiation Oncology | Admitting: Radiation Oncology

## 2012-11-24 ENCOUNTER — Ambulatory Visit
Admission: RE | Admit: 2012-11-24 | Discharge: 2012-11-24 | Disposition: A | Discharge status: Home / Self Care | Payer: Medicaid Other | Source: Ambulatory Visit | Attending: Radiation Oncology | Admitting: Radiation Oncology

## 2012-11-25 ENCOUNTER — Ambulatory Visit
Admission: RE | Admit: 2012-11-25 | Discharge: 2012-11-25 | Disposition: A | Discharge status: Home / Self Care | Payer: Medicaid Other | Source: Ambulatory Visit | Attending: Radiation Oncology | Admitting: Radiation Oncology

## 2012-11-26 ENCOUNTER — Ambulatory Visit
Admission: RE | Admit: 2012-11-26 | Discharge: 2012-11-26 | Disposition: A | Discharge status: Home / Self Care | Payer: Medicaid Other | Source: Ambulatory Visit | Attending: Radiation Oncology | Admitting: Radiation Oncology

## 2012-11-27 ENCOUNTER — Ambulatory Visit
Admission: RE | Admit: 2012-11-27 | Discharge: 2012-11-27 | Disposition: A | Discharge status: Home / Self Care | Payer: Medicaid Other | Source: Ambulatory Visit | Attending: Radiation Oncology | Admitting: Radiation Oncology

## 2012-11-28 ENCOUNTER — Ambulatory Visit
Admission: RE | Admit: 2012-11-28 | Discharge: 2012-11-28 | Disposition: A | Discharge status: Home / Self Care | Payer: Medicaid Other | Source: Ambulatory Visit | Attending: Radiation Oncology | Admitting: Radiation Oncology

## 2012-11-28 DIAGNOSIS — C50412 Malignant neoplasm of upper-outer quadrant of left female breast: Secondary | ICD-10-CM

## 2012-11-28 NOTE — Progress Notes (Signed)
   Department of Radiation Oncology  Phone:  [phone] Fax:        [phone]  Weekly Treatment Note    Name: Tasha Ortiz Date: 11/28/2012 MRN: [id number] DOB: 1955-06-21   Current dose: 39.6 Gy  Current fraction: 22   MEDICATIONS: Current Outpatient Prescriptions  Medication Sig Dispense Refill  . acetaminophen (TYLENOL) 325 MG tablet Take 2 tablets (650 mg total) by mouth every 6 (six) hours as needed for pain.  60 tablet  2  . B Complex-C (SUPER B COMPLEX PO) Take 1 tablet by mouth daily.      . famotidine (PEPCID) 20 MG tablet Take 20 mg by mouth 2 (two) times daily.      Marland Kitchen guaiFENesin (MUCINEX) 600 MG 12 hr tablet Take 1,200 mg by mouth 2 (two) times daily as needed. For cough      . lisinopril-hydrochlorothiazide (PRINZIDE,ZESTORETIC) 20-25 MG per tablet TAKE ONE TABLET BY MOUTH EVERY DAY  90 tablet  1  . non-metallic deodorant (ALRA) MISC Apply 1 application topically daily as needed.      . pravastatin (PRAVACHOL) 40 MG tablet Take 40 mg by mouth daily.      . sodium chloride (OCEAN) 0.65 % nasal spray Place 4 sprays into the nose daily as needed. For congestion.      . Wound Cleansers (RADIAPLEX EX) Apply topically.      . [DISCONTINUED] Prochlorperazine Maleate (COMPAZINE PO) Take by mouth.       No current facility-administered medications for this encounter.     ALLERGIES: Review of patient's allergies indicates no known allergies.   LABORATORY DATA:  Lab Results  Component Value Date   WBC 2.8* 11/11/2012   HGB 11.2* 11/11/2012   HCT 33.4* 11/11/2012   MCV 90.8 11/11/2012   PLT 252 11/11/2012   Lab Results  Component Value Date   NA 141 10/14/2012   K 3.8 10/14/2012   CL 103 09/23/2012   CO2 23 10/14/2012   Lab Results  Component Value Date   ALT 22 10/14/2012   AST 18 10/14/2012   ALKPHOS 81 10/14/2012   BILITOT 0.25 10/14/2012     NARRATIVE: Tasha Ortiz was seen today for weekly treatment management. The chart was checked and the  patient's films were reviewed. The patient states that she is doing well in the treatment area. She is using skin cream daily. No real complaints at this time in terms of irritative symptoms.  PHYSICAL EXAMINATION: vitals were not taken for this visit.     hyperpigmentation present in the treatment area. No desquamation.  ASSESSMENT: The patient is doing satisfactorily with treatment.  PLAN: We will continue with the patient's radiation treatment as planned.

## 2012-11-28 NOTE — Progress Notes (Signed)
Tasha Ortiz here for weekly under treat visit.  She has had 22 fractions to her right chest wall.  She does have pain in her left forearm that she is rating at a 4/10.  She thinks it is due to caring school books.  The skin on her right chest is intact with hyperpigmentation.  She is using radiaplex gel once a day.  Encouraged her to start applying it twice a day.  She denies fatigue.

## 2012-12-02 ENCOUNTER — Ambulatory Visit
Admission: RE | Admit: 2012-12-02 | Discharge: 2012-12-02 | Disposition: A | Discharge status: Home / Self Care | Payer: Medicaid Other | Source: Ambulatory Visit | Attending: Radiation Oncology | Admitting: Radiation Oncology

## 2012-12-03 ENCOUNTER — Ambulatory Visit
Admission: RE | Admit: 2012-12-03 | Discharge: 2012-12-03 | Disposition: A | Discharge status: Home / Self Care | Payer: Medicaid Other | Source: Ambulatory Visit | Attending: Radiation Oncology | Admitting: Radiation Oncology

## 2012-12-04 ENCOUNTER — Ambulatory Visit
Admission: RE | Admit: 2012-12-04 | Discharge: 2012-12-04 | Disposition: A | Discharge status: Home / Self Care | Payer: Medicaid Other | Source: Ambulatory Visit | Attending: Radiation Oncology | Admitting: Radiation Oncology

## 2012-12-04 ENCOUNTER — Ambulatory Visit: Payer: Medicaid Other | Admitting: Radiation Oncology

## 2012-12-05 ENCOUNTER — Ambulatory Visit: Payer: Medicaid Other | Admitting: Radiation Oncology

## 2012-12-05 ENCOUNTER — Ambulatory Visit
Admission: RE | Admit: 2012-12-05 | Discharge: 2012-12-05 | Disposition: A | Discharge status: Home / Self Care | Payer: Medicaid Other | Source: Ambulatory Visit | Attending: Radiation Oncology | Admitting: Radiation Oncology

## 2012-12-05 VITALS — BP 114/77 | HR 80 | Temp 98.2°F | Ht 63.0 in | Wt 178.8 lb

## 2012-12-05 DIAGNOSIS — C50412 Malignant neoplasm of upper-outer quadrant of left female breast: Secondary | ICD-10-CM

## 2012-12-05 NOTE — Progress Notes (Signed)
   Department of Radiation Oncology  Phone:  [phone] Fax:        [phone]  Weekly Treatment Note    Name: Tasha Ortiz Date: 12/05/2012 MRN: [id number] DOB: Oct 04, 1955   Current dose: 46.8 Gy  Current fraction: 26   MEDICATIONS: Current Outpatient Prescriptions  Medication Sig Dispense Refill  . acetaminophen (TYLENOL) 325 MG tablet Take 2 tablets (650 mg total) by mouth every 6 (six) hours as needed for pain.  60 tablet  2  . B Complex-C (SUPER B COMPLEX PO) Take 1 tablet by mouth daily.      . famotidine (PEPCID) 20 MG tablet Take 20 mg by mouth 2 (two) times daily.      Marland Kitchen guaiFENesin (MUCINEX) 600 MG 12 hr tablet Take 1,200 mg by mouth 2 (two) times daily as needed. For cough      . lisinopril-hydrochlorothiazide (PRINZIDE,ZESTORETIC) 20-25 MG per tablet TAKE ONE TABLET BY MOUTH EVERY DAY  90 tablet  1  . non-metallic deodorant (ALRA) MISC Apply 1 application topically daily as needed.      . pravastatin (PRAVACHOL) 40 MG tablet Take 40 mg by mouth daily.      . sodium chloride (OCEAN) 0.65 % nasal spray Place 4 sprays into the nose daily as needed. For congestion.      . Wound Cleansers (RADIAPLEX EX) Apply topically.      . [DISCONTINUED] Prochlorperazine Maleate (COMPAZINE PO) Take by mouth.       No current facility-administered medications for this encounter.     ALLERGIES: Review of patient's allergies indicates no known allergies.   LABORATORY DATA:  Lab Results  Component Value Date   WBC 2.8* 11/11/2012   HGB 11.2* 11/11/2012   HCT 33.4* 11/11/2012   MCV 90.8 11/11/2012   PLT 252 11/11/2012   Lab Results  Component Value Date   NA 141 10/14/2012   K 3.8 10/14/2012   CL 103 09/23/2012   CO2 23 10/14/2012   Lab Results  Component Value Date   ALT 22 10/14/2012   AST 18 10/14/2012   ALKPHOS 81 10/14/2012   BILITOT 0.25 10/14/2012     NARRATIVE: Tasha Ortiz was seen today for weekly treatment management. The chart was checked and the patient's  films were reviewed. The patient states that she is doing well. She is having some irritation in the right axilla which has been ongoing. She is using skin cream twice a day.  PHYSICAL EXAMINATION: height is 5\' 3"  (1.6 m) and weight is 178 lb 12.8 oz (81.103 kg). Her temperature is 98.2 F (36.8 C). Her blood pressure is 114/77 and her pulse is 80.      hyperpigmentation present especially in the upper axilla. Skin essentially intact.  ASSESSMENT: The patient is doing satisfactorily with treatment.  PLAN: We will continue with the patient's radiation treatment as planned. She will continue her current skin management which seems to be working well. We will begin her boost treatment next week.

## 2012-12-05 NOTE — Progress Notes (Signed)
Tasha Ortiz here for weekly under treat visit.  She has had 26 fractions to her right chest wall.  She does have pain/irritation under her right arm.  The skin on her right chest and underarm has hyperpigmentation.  She does have some peeling under her right underarm.  She is wearing a lymphedema sleeve today.  She has lost 4 lbs since 11/27/12 but says she has stopped eating pork and has been eating less.  The steroids for chemotherapy made her gain weight.  She denies fatigue.

## 2012-12-08 ENCOUNTER — Ambulatory Visit
Admission: RE | Admit: 2012-12-08 | Discharge: 2012-12-08 | Disposition: A | Discharge status: Home / Self Care | Payer: Medicaid Other | Source: Ambulatory Visit | Attending: Radiation Oncology | Admitting: Radiation Oncology

## 2012-12-08 ENCOUNTER — Encounter: Payer: Self-pay | Admitting: Radiation Oncology

## 2012-12-09 ENCOUNTER — Ambulatory Visit
Admission: RE | Admit: 2012-12-09 | Discharge: 2012-12-09 | Disposition: A | Discharge status: Home / Self Care | Payer: Medicaid Other | Source: Ambulatory Visit | Attending: Radiation Oncology | Admitting: Radiation Oncology

## 2012-12-10 ENCOUNTER — Ambulatory Visit
Admission: RE | Admit: 2012-12-10 | Discharge: 2012-12-10 | Disposition: A | Discharge status: Home / Self Care | Payer: Medicaid Other | Source: Ambulatory Visit | Attending: Radiation Oncology | Admitting: Radiation Oncology

## 2012-12-11 ENCOUNTER — Other Ambulatory Visit: Payer: Self-pay | Admitting: Family Medicine

## 2012-12-11 ENCOUNTER — Ambulatory Visit
Admission: RE | Admit: 2012-12-11 | Discharge: 2012-12-11 | Disposition: A | Discharge status: Home / Self Care | Payer: Medicaid Other | Source: Ambulatory Visit | Attending: Radiation Oncology | Admitting: Radiation Oncology

## 2012-12-12 ENCOUNTER — Ambulatory Visit
Admission: RE | Admit: 2012-12-12 | Discharge: 2012-12-12 | Disposition: A | Discharge status: Home / Self Care | Payer: Medicaid Other | Source: Ambulatory Visit | Attending: Radiation Oncology | Admitting: Radiation Oncology

## 2012-12-12 VITALS — BP 121/84 | HR 70 | Temp 97.9°F | Ht 63.0 in | Wt 178.9 lb

## 2012-12-12 DIAGNOSIS — C50411 Malignant neoplasm of upper-outer quadrant of right female breast: Secondary | ICD-10-CM

## 2012-12-12 MED ORDER — RADIAPLEXRX EX GEL
Freq: Once | CUTANEOUS | Status: AC
  Administered 2012-12-12: 15:00:00 via TOPICAL

## 2012-12-12 NOTE — Progress Notes (Addendum)
Tasha Ortiz here for weekly under treat visit.  She has had 31 fractions to her right chest.  She does have pain under her right arm that she is rating at a 3/10.  The skin under her right arm is peeling and there is an area of desquamation.  Her right chest has hyperpigmentation.  She is currently using radiaplex gel twice a day and she does needs a refill.  Anther tube given per Dr. Mitzi Hansen.  She denies fatigue.

## 2012-12-12 NOTE — Progress Notes (Signed)
   Department of Radiation Oncology  Phone:  [phone] Fax:        [phone]  Weekly Treatment Note    Name: Tasha Ortiz Date: 12/12/2012 MRN: [id number] DOB: 06/26/1955   Current dose: 56.4 Gy  Current fraction: 31   MEDICATIONS: Current Outpatient Prescriptions  Medication Sig Dispense Refill  . acetaminophen (TYLENOL) 325 MG tablet Take 2 tablets (650 mg total) by mouth every 6 (six) hours as needed for pain.  60 tablet  2  . B Complex-C (SUPER B COMPLEX PO) Take 1 tablet by mouth daily.      . famotidine (PEPCID) 20 MG tablet Take 20 mg by mouth 2 (two) times daily.      Marland Kitchen guaiFENesin (MUCINEX) 600 MG 12 hr tablet Take 1,200 mg by mouth 2 (two) times daily as needed. For cough      . lisinopril-hydrochlorothiazide (PRINZIDE,ZESTORETIC) 20-25 MG per tablet TAKE ONE TABLET BY MOUTH EVERY DAY  90 tablet  1  . non-metallic deodorant (ALRA) MISC Apply 1 application topically daily as needed.      . pravastatin (PRAVACHOL) 40 MG tablet TAKE ONE TABLET BY MOUTH EVERY DAY  90 tablet  3  . sodium chloride (OCEAN) 0.65 % nasal spray Place 4 sprays into the nose daily as needed. For congestion.      . Wound Cleansers (RADIAPLEX EX) Apply topically.      . [DISCONTINUED] Prochlorperazine Maleate (COMPAZINE PO) Take by mouth.       Current Facility-Administered Medications  Medication Dose Route Frequency Provider Last Rate Last Dose  . hyaluronate sodium (RADIAPLEXRX) gel   Topical Once Jonna Coup, MD         ALLERGIES: Review of patient's allergies indicates no known allergies.   LABORATORY DATA:  Lab Results  Component Value Date   WBC 2.8* 11/11/2012   HGB 11.2* 11/11/2012   HCT 33.4* 11/11/2012   MCV 90.8 11/11/2012   PLT 252 11/11/2012   Lab Results  Component Value Date   NA 141 10/14/2012   K 3.8 10/14/2012   CL 103 09/23/2012   CO2 23 10/14/2012   Lab Results  Component Value Date   ALT 22 10/14/2012   AST 18 10/14/2012   ALKPHOS 81 10/14/2012   BILITOT 0.25 10/14/2012     NARRATIVE: Tasha Ortiz was seen today for weekly treatment management. The chart was checked and the patient's films were reviewed. The patient has some ongoing skin irritation, especially in the right axilla. She needs additional skin cream. No fatigue.  PHYSICAL EXAMINATION: height is 5\' 3"  (1.6 m) and weight is 178 lb 14.4 oz (81.149 kg). Her temperature is 97.9 F (36.6 C). Her blood pressure is 121/84 and her pulse is 70.      diffuse hyperpigmentation in the treatment area. A patch of desquamation is present in the low axilla with some moist desquamation.  ASSESSMENT: The patient is doing satisfactorily with treatment.  She is having some increased skin irritation towards the end of treatment especially in the right axilla.  PLAN: We will continue with the patient's radiation treatment as planned. The patient will continue radioplex skin cream. She will also begin [place] soaks for the area of moist desquamation.

## 2012-12-15 ENCOUNTER — Ambulatory Visit
Admission: RE | Admit: 2012-12-15 | Discharge: 2012-12-15 | Disposition: A | Discharge status: Home / Self Care | Payer: Medicaid Other | Source: Ambulatory Visit | Attending: Radiation Oncology | Admitting: Radiation Oncology

## 2012-12-16 ENCOUNTER — Encounter: Payer: Self-pay | Admitting: Radiation Oncology

## 2012-12-16 ENCOUNTER — Ambulatory Visit
Admission: RE | Admit: 2012-12-16 | Discharge: 2012-12-16 | Disposition: A | Discharge status: Home / Self Care | Payer: Medicaid Other | Source: Ambulatory Visit | Attending: Radiation Oncology | Admitting: Radiation Oncology

## 2012-12-17 ENCOUNTER — Encounter (INDEPENDENT_AMBULATORY_CARE_PROVIDER_SITE_OTHER): Payer: Self-pay | Admitting: General Surgery

## 2012-12-17 ENCOUNTER — Ambulatory Visit (INDEPENDENT_AMBULATORY_CARE_PROVIDER_SITE_OTHER): Payer: Medicaid Other | Admitting: General Surgery

## 2012-12-17 VITALS — BP 124/70 | HR 78 | Temp 97.0°F | Resp 14 | Ht 63.0 in | Wt 179.8 lb

## 2012-12-17 DIAGNOSIS — C50411 Malignant neoplasm of upper-outer quadrant of right female breast: Secondary | ICD-10-CM

## 2012-12-17 DIAGNOSIS — C50419 Malignant neoplasm of upper-outer quadrant of unspecified female breast: Secondary | ICD-10-CM

## 2012-12-17 NOTE — Progress Notes (Signed)
Chief complaint: Followup cancer right breast  History: Patient returns for long-term followup for about 9 months following diagnosis of T2 N1 ER PR positive HER-2 negative cancer of the right breast. She had 3 separate primaries in her breast and underwent modified mastectomy with one of 17 lymph nodes positive. She completed chemotherapy and now has just finished radiation therapy. She got through chemotherapy quite nicely. She has some skin breakdown on her arm after radiation therapy but overall is feeling well and is for a happy baby through with treatment. She has not noted any other lumps or skin changes or unusual pain or any change in her other breast.  Exam: BP 124/70  Pulse 78  Temp(Src) 97 F (36.1 C) (Temporal)  Resp 14  Ht 5\' 3"  (1.6 m)  Wt 179 lb 12.8 oz (81.557 kg)  BMI 31.86 kg/m2 General: Appears well HEENT: No palpable masses or thyromegaly Lymph nodes: No cervical, specific thorax R. Nodes palpable Lungs: Clear equal breath sounds bilaterally without increased work of breathing Breasts: Right chest wall shows hyperpigmentation from radiation therapy in about a 8 x 2 cm area of superficial skin breakdown just beneath the axilla. This is clean and appears to be healing well.  Assessment and plan: Doing well following mastectomy chemotherapy and radiation as above. No major difficulties for treatment. She has a prosthesis that fits well. She is to return in 6-9 months.

## 2012-12-22 NOTE — Progress Notes (Signed)
  Radiation Oncology         [phone] ________________________________  Name: Tasha Ortiz MRN: [id number]  Date: 12/08/2012  DOB: 07-29-1955  Complex simulation note  The patient has undergone complex simulation for her upcoming boost treatment for her diagnosis of breast cancer. The patient has initially been planned to receive 50.4 gray. The patient will now receive a 10 gray boost to the seroma cavity which has been contoured. This will be accomplished using an en face electron field. Based on the depth of the target area, 9 MeV electrons will be used and this field has been normalized to the 92% isodose line. The patient's final total dose therefore will be 60.4 gray. A special port plan is requested for the boost treatment.   _______________________________  Radene Gunning, MD, PhD

## 2012-12-22 NOTE — Progress Notes (Signed)
  Radiation Oncology         [phone]) [phone] ________________________________  Name: Tasha Ortiz MRN: [id number]  Date: 12/16/2012  DOB: 04/09/1955  End of Treatment Note  Diagnosis:   Invasive ductal carcinoma of the right breast     Indication for treatment:  Curative       Radiation treatment dates:   10/30/2012 through 12/16/2012  Site/dose:   The patient was initially treated to the right chest wall/supraclavicular region to a dose of 50.4 gray at 1.8 gray per fraction. This was carried out using a 4 field technique. The patient then received a 10 gray boost to the surgical incision using an electron field at 2 gray per fraction. A total dose was 60.4 gray.  Narrative: The patient tolerated radiation treatment relatively well.   The patient experienced some skin irritation as expected towards the end of treatment. This was managed with several [employer] [place] Fearing and the patient's skin was expected to heal substantially within 2-3 weeks after completion.  Plan: The patient has completed radiation treatment. The patient will return to radiation oncology clinic for routine followup in one month. I advised the patient to call or return sooner if they have any questions or concerns related to their recovery or treatment. ________________________________  Radene Gunning, M.D., Ph.D.

## 2012-12-31 ENCOUNTER — Other Ambulatory Visit (HOSPITAL_BASED_OUTPATIENT_CLINIC_OR_DEPARTMENT_OTHER): Payer: No Typology Code available for payment source

## 2012-12-31 DIAGNOSIS — C50412 Malignant neoplasm of upper-outer quadrant of left female breast: Secondary | ICD-10-CM

## 2012-12-31 DIAGNOSIS — C50419 Malignant neoplasm of upper-outer quadrant of unspecified female breast: Secondary | ICD-10-CM

## 2012-12-31 LAB — CBC WITH DIFFERENTIAL/PLATELET
BASO%: 0.6 % (ref 0.0–2.0)
Basophils Absolute: 0 10*3/uL (ref 0.0–0.1)
EOS%: 4 % (ref 0.0–7.0)
Eosinophils Absolute: 0.1 10*3/uL (ref 0.0–0.5)
HCT: 35 % (ref 34.8–46.6)
HGB: 12 g/dL (ref 11.6–15.9)
LYMPH%: 24.5 % (ref 14.0–49.7)
MCH: 29.1 pg (ref 25.1–34.0)
MCHC: 34.2 g/dL (ref 31.5–36.0)
MCV: 85 fL (ref 79.5–101.0)
MONO#: 0.3 10*3/uL (ref 0.1–0.9)
MONO%: 10.1 % (ref 0.0–14.0)
NEUT#: 1.9 10*3/uL (ref 1.5–6.5)
NEUT%: 60.8 % (ref 38.4–76.8)
Platelets: 208 10*3/uL (ref 145–400)
RBC: 4.12 10*6/uL (ref 3.70–5.45)
RDW: 13.8 % (ref 11.2–14.5)
WBC: 3.2 10*3/uL — ABNORMAL LOW (ref 3.9–10.3)
lymph#: 0.8 10*3/uL — ABNORMAL LOW (ref 0.9–3.3)

## 2012-12-31 LAB — COMPREHENSIVE METABOLIC PANEL (CC13)
ALT: 16 U/L (ref 0–55)
AST: 15 U/L (ref 5–34)
Albumin: 3.4 g/dL — ABNORMAL LOW (ref 3.5–5.0)
Alkaline Phosphatase: 56 U/L (ref 40–150)
BUN: 10.3 mg/dL (ref 7.0–26.0)
CO2: 22 mEq/L (ref 22–29)
Calcium: 9.6 mg/dL (ref 8.4–10.4)
Chloride: 107 mEq/L (ref 98–109)
Creatinine: 0.9 mg/dL (ref 0.6–1.1)
Glucose: 186 mg/dl — ABNORMAL HIGH (ref 70–140)
Potassium: 3.3 mEq/L — ABNORMAL LOW (ref 3.5–5.1)
Sodium: 141 mEq/L (ref 136–145)
Total Bilirubin: 0.26 mg/dL (ref 0.20–1.20)
Total Protein: 7.1 g/dL (ref 6.4–8.3)

## 2013-01-05 ENCOUNTER — Ambulatory Visit: Payer: No Typology Code available for payment source

## 2013-01-07 ENCOUNTER — Ambulatory Visit (HOSPITAL_BASED_OUTPATIENT_CLINIC_OR_DEPARTMENT_OTHER): Payer: No Typology Code available for payment source | Admitting: Oncology

## 2013-01-07 ENCOUNTER — Encounter: Payer: Self-pay | Admitting: Oncology

## 2013-01-07 ENCOUNTER — Telehealth: Payer: Self-pay | Admitting: *Deleted

## 2013-01-07 VITALS — BP 123/81 | HR 86 | Temp 98.2°F | Resp 19 | Ht 63.0 in | Wt 180.7 lb

## 2013-01-07 DIAGNOSIS — C50411 Malignant neoplasm of upper-outer quadrant of right female breast: Secondary | ICD-10-CM

## 2013-01-07 DIAGNOSIS — C773 Secondary and unspecified malignant neoplasm of axilla and upper limb lymph nodes: Secondary | ICD-10-CM

## 2013-01-07 DIAGNOSIS — C50919 Malignant neoplasm of unspecified site of unspecified female breast: Secondary | ICD-10-CM

## 2013-01-07 MED ORDER — TAMOXIFEN CITRATE 20 MG PO TABS: 20.0000 mg | ORAL_TABLET | Freq: Every day | ORAL | Status: AC

## 2013-01-07 NOTE — Patient Instructions (Signed)
Proceed with tamoxifen 20 mg daily  We will see you back in 3 months  Tamoxifen oral tablet What is this medicine? TAMOXIFEN (ta MOX i fen) blocks the effects of estrogen. It is commonly used to treat breast cancer. It is also used to decrease the chance of breast cancer coming back in women who have received treatment for the disease. It may also help prevent breast cancer in women who have a high risk of developing breast cancer. This medicine may be used for other purposes; ask your health care provider or pharmacist if you have questions. What should I tell my health care provider before I take this medicine? They need to know if you have any of these conditions: -blood clots -blood disease -cataracts or impaired eyesight -endometriosis -high calcium levels -high cholesterol -irregular menstrual cycles -liver disease -stroke -uterine fibroids -an unusual or allergic reaction to tamoxifen, other medicines, foods, dyes, or preservatives -pregnant or trying to get pregnant -breast-feeding How should I use this medicine? Take this medicine by mouth with a glass of water. Follow the directions on the prescription label. You can take it with or without food. Take your medicine at regular intervals. Do not take your medicine more often than directed. Do not stop taking except on your doctor's advice. A special MedGuide will be given to you by the pharmacist with each prescription and refill. Be sure to read this information carefully each time. Talk to your pediatrician regarding the use of this medicine in children. While this drug may be prescribed for selected conditions, precautions do apply. Overdosage: If you think you have taken too much of this medicine contact a poison control center or emergency room at once. NOTE: This medicine is only for you. Do not share this medicine with others. What if I miss a dose? If you miss a dose, take it as soon as you can. If it is almost time for  your next dose, take only that dose. Do not take double or extra doses. What may interact with this medicine? -aminoglutethimide -bromocriptine -chemotherapy drugs -female hormones, like estrogens and birth control pills -letrozole -medroxyprogesterone -phenobarbital -rifampin -warfarin This list may not describe all possible interactions. Give your health care provider a list of all the medicines, herbs, non-prescription drugs, or dietary supplements you use. Also tell them if you smoke, drink alcohol, or use illegal drugs. Some items may interact with your medicine. What should I watch for while using this medicine? Visit your doctor or health care professional for regular checks on your progress. You will need regular pelvic exams, breast exams, and mammograms. If you are taking this medicine to reduce your risk of getting breast cancer, you should know that this medicine does not prevent all types of breast cancer. If breast cancer or other problems occur, there is no guarantee that it will be found at an early stage. Do not become pregnant while taking this medicine or for 2 months after stopping this medicine. Stop taking this medicine if you get pregnant or think you are pregnant and contact your doctor. This medicine may harm your unborn baby. Women who can possibly become pregnant should use birth control methods that do not use hormones during tamoxifen treatment and for 2 months after therapy has stopped. Talk with your health care provider for birth control advice. Do not breast feed while taking this medicine. What side effects may I notice from receiving this medicine? Side effects that you should report to your doctor or health  care professional as soon as possible: -changes in vision (blurred vision) -changes in your menstrual cycle -difficulty breathing or shortness of breath -difficulty walking or talking -new breast lumps -numbness -pelvic pain or pressure -redness,  blistering, peeling or loosening of the skin, including inside the mouth -skin rash or itching (hives) -sudden chest pain -swelling of lips, face, or tongue -swelling, pain or tenderness in your calf or leg -unusual bruising or bleeding -vaginal discharge that is bloody, brown, or rust -weakness -yellowing of the whites of the eyes or skin Side effects that usually do not require medical attention (report to your doctor or health care professional if they continue or are bothersome): -fatigue -hair loss, although uncommon and is usually mild -headache -hot flashes -impotence (in men) -nausea, vomiting (mild) -vaginal discharge (white or clear) This list may not describe all possible side effects. Call your doctor for medical advice about side effects. You may report side effects to FDA at 1-800-FDA-1088. Where should I keep my medicine? Keep out of the reach of children. Store at room temperature between 20 and 25 degrees C (68 and 77 degrees F). Protect from light. Keep container tightly closed. Throw away any unused medicine after the expiration date. NOTE: This sheet is a summary. It may not cover all possible information. If you have questions about this medicine, talk to your doctor, pharmacist, or health care provider.  2012, [employer]. (12/04/2007 12:01:56 PM)

## 2013-01-07 NOTE — Telephone Encounter (Signed)
appts made and printed. gv pt the info for GYN...td

## 2013-01-07 NOTE — Progress Notes (Signed)
OFFICE PROGRESS NOTE  CCGildardo Cranker, DO [address] [place] [address]  DIAGNOSIS: 57 year old female with stage IIB right breast cancer, ER positive, PR positive, HER-2/neu negative. Node positive   PRIOR THERAPY:  #1 .Who was seen in the multidisciplinary breast clinic for consultation. Patient noted a mass in the right breast about 4 months ago. She's had a mammogram every year on an annual basis. On her prior mammogram there were no suspicious findings. But because of the mass she presented for workup for the palpable mass. On mammogram there was noted to be an abnormality at the 11:00 position in the right breast corresponding to the palpable abnormality. There were also noted to be 2 additional suspicious masses within the right breast as well as abnormal right axillary lymph nodes. Patient went on to have an ultrasound-guided core needle biopsy of all 3 masses and the axillary lymph node. All 3 masses were positive for invasive ductal carcinoma. 2 primaries appear to be HER-2/neu negative ER positive PR +1 of the primaries demonstrated mucinous features with Ki-67 20%. The lymph node had a higher Ki-67 of 70%. Indicating there were 2 synchronous primaries. Patient underwent MRI of the breasts. Again 3 lesions were noted within the right breast. The larger mass was at the 10:00 position measuring 2.3 cm. An abnormal level I right axillary lymph node measures 3.0 cm. No abnormalities in the contralateral breast   #2 Patient is now status post right modified radical mastectomy. She was found to have multifocal disease. Tumor was ER PR positive HER-2/neu negative. She had one of 17 lymph nodes positive for metastatic disease. We discussed her pathology in detail today.   #3 patient began adjuvant chemotherapy consisting of Taxotere Cytoxan every 6 weeks with day 2 Neulasta. She has received a total of 6 cycles from 06/24/2012 through 10/07/2012.  #4 patient is scheduled to be  seen by Dr. Dorothy Puffer on 10/16/2012  - 12/16/12 for consideration of adjuvant radiation therapy.  #5. Begin adjuvant tamoxifen 20 mg daily for 5-10, we discussed rational/risks/benefits of tamoxifen.   CURRENT THERAPY: tamoxifen 20 mg daily beginning 01/07/13  INTERVAL HISTORY: Tasha Ortiz 57 y.o. female returns for f/u Overall she is doing well without any problems. She denies any fevers chills night sweats headaches shortness of breath chest pains palpitations. She does need her nausea medications refilled which I have done. She is also experiencing some lower extremity swelling which is minimal. This is most likely due to the steroids and the chemotherapy itself. I have recommended she keep her feet elevated. She has no peripheral paresthesias no easy bruising remainder of the 10 point review of systems is negative.  MEDICAL HISTORY: Past Medical History  Diagnosis Date  . Hypertension   . Hypercholesteremia     taken off chol meds Dec 2012  . Acid reflux   . Breast cancer   . Heartburn     ALLERGIES:  has No Known Allergies.  MEDICATIONS:  Current Outpatient Prescriptions  Medication Sig Dispense Refill  . acetaminophen (TYLENOL) 325 MG tablet Take 2 tablets (650 mg total) by mouth every 6 (six) hours as needed for pain.  60 tablet  2  . B Complex-C (SUPER B COMPLEX PO) Take 1 tablet by mouth daily.      . famotidine (PEPCID) 20 MG tablet Take 20 mg by mouth 2 (two) times daily.      Marland Kitchen guaiFENesin (MUCINEX) 600 MG 12 hr tablet Take 1,200 mg  by mouth 2 (two) times daily as needed. For cough      . lisinopril-hydrochlorothiazide (PRINZIDE,ZESTORETIC) 20-25 MG per tablet TAKE ONE TABLET BY MOUTH EVERY DAY  90 tablet  1  . non-metallic deodorant (ALRA) MISC Apply 1 application topically daily as needed.      . pravastatin (PRAVACHOL) 40 MG tablet TAKE ONE TABLET BY MOUTH EVERY DAY  90 tablet  3  . sodium chloride (OCEAN) 0.65 % nasal spray Place 4 sprays into the nose daily as  needed. For congestion.      . Wound Cleansers (RADIAPLEX EX) Apply topically.      . [DISCONTINUED] Prochlorperazine Maleate (COMPAZINE PO) Take by mouth.       No current facility-administered medications for this visit.    SURGICAL HISTORY:  Past Surgical History  Procedure Laterality Date  . Cesarean section  25 years ago  . Tubal ligation    . Mastectomy Right   . Modified mastectomy Right 05/20/2012    Procedure: MODIFIED MASTECTOMY;  Surgeon: Mariella Saa, MD;  Location: [facility] OR;  Service: General;  Laterality: Right;  . Portacath placement Left 05/20/2012    Procedure: INSERTION PORT-A-CATH;  Surgeon: Mariella Saa, MD;  Location: [facility] OR;  Service: General;  Laterality: Left;  . Breast surgery Right 05/20/12    Rt br mastectomy    REVIEW OF SYSTEMS:  General: fatigue (+), night sweats (-), fever (-), pain (-) Lymph: palpable nodes (-) HEENT: vision changes (-), mucositis (-), gum bleeding (-), epistaxis (-) Cardiovascular: chest pain (-), palpitations (-) Pulmonary: shortness of breath (-), dyspnea on exertion (-), cough (-), hemoptysis (-) GI:  Early satiety (-), melena (-), dysphagia (-), nausea/vomiting (+), diarrhea (-) GU: dysuria (-), hematuria (-), incontinence (-) Musculoskeletal: joint swelling (-), joint pain (-), back pain (-) Neuro: weakness (-), numbness (-), headache (-), confusion (-) Skin: Rash (-), lesions (-), dryness (-) Psych: depression (-), suicidal/homicidal ideation (-), feeling of hopelessness (-)   PHYSICAL EXAMINATION: Blood pressure 123/81, pulse 86, temperature 98.2 F (36.8 C), temperature source Oral, resp. rate 19, height 5\' 3"  (1.6 m), weight 180 lb 11.2 oz (81.965 kg). Body mass index is 32.02 kg/(m^2). General: Patient is a well appearing female in no acute distress HEENT: PERRLA, sclerae anicteric no conjunctival pallor, MMM Neck: supple, no palpable adenopathy Lungs: clear to auscultation bilaterally, no wheezes, rhonchi, or  rales Cardiovascular: regular rate rhythm, S1, S2, no murmurs, rubs or gallops Abdomen: Soft, non-tender, non-distended, normoactive bowel sounds, no HSM Extremities: warm and well perfused, no clubbing, cyanosis, or edema Skin: No rashes or lesions Neuro: Non-focal Breasts: right mastecomy site well healed, no nodularity, left breast, no masses, nodules or skin changes noted ECOG PERFORMANCE STATUS: 1 - Symptomatic but completely ambulatory      LABORATORY DATA: Lab Results  Component Value Date   WBC 3.2* 12/31/2012   HGB 12.0 12/31/2012   HCT 35.0 12/31/2012   MCV 85.0 12/31/2012   PLT 208 12/31/2012      Chemistry      Component Value Date/Time   NA 141 12/31/2012 0821   NA 138 05/21/2012 0515   K 3.3* 12/31/2012 0821   K 4.2 05/21/2012 0515   CL 103 09/23/2012 1040   CL 104 05/21/2012 0515   CO2 22 12/31/2012 0821   CO2 25 05/21/2012 0515   BUN 10.3 12/31/2012 0821   BUN 10 05/21/2012 0515   CREATININE 0.9 12/31/2012 0821   CREATININE 0.89 05/21/2012 0515   CREATININE  0.85 03/10/2012 1544      Component Value Date/Time   CALCIUM 9.6 12/31/2012 0821   CALCIUM 9.1 05/21/2012 0515   ALKPHOS 56 12/31/2012 0821   ALKPHOS 47 04/06/2012 2306   AST 15 12/31/2012 0821   AST 21 04/06/2012 2306   ALT 16 12/31/2012 0821   ALT 16 04/06/2012 2306   BILITOT 0.26 12/31/2012 0821   BILITOT 0.2* 04/06/2012 2306    REASON FOR ADDENDUM, AMENDMENT OR CORRECTION: [id number]: Modification of the tumor staging. 05/28/12 08:14:17 AM (gt) ADDITIONAL INFORMATION: CHROMOGENIC IN-SITU HYBRIDIZATION (Block 1G) Interpretation HER-2/NEU BY CISH - NO AMPLIFICATION OF HER-2 DETECTED. THE RATIO OF HER-2: CEP 17 SIGNALS WAS 0.93. Reference range: Ratio: HER2:CEP17 < 1.8 - gene amplification not observed Ratio: HER2:CEP 17 1.8-2.2 - equivocal result Ratio: HER2:CEP17 > 2.2 - gene amplification observed Jimmy Picket MD Pathologist, Electronic Signature ( Signed 05/28/2012) CHROMOGENIC IN-SITU HYBRIDIZATION  (Block 1D) Interpretation HER-2/NEU BY CISH - NO AMPLIFICATION OF HER-2 DETECTED. THE RATIO OF HER-2: CEP 17 SIGNALS WAS 1.15. Reference range: Ratio: HER2:CEP17 < 1.8 - gene amplification not observed Ratio: HER2:CEP 17 1.8-2.2 - equivocal result Ratio: HER2:CEP17 > 2.2 - gene amplification observed Jimmy Picket MD 1 of 4 Amended copy Amended FINAL for Tasha Ortiz, Tasha Ortiz [phone]) ADDITIONAL INFORMATION:(continued) Pathologist, Electronic Signature ( Signed 05/28/2012) CHROMOGENIC IN-SITU HYBRIDIZATION (Block 1A) Interpretation HER-2/NEU BY CISH - NO AMPLIFICATION OF HER-2 DETECTED. THE RATIO OF HER-2: CEP 17 SIGNALS WAS 1.28. Reference range: Ratio: HER2:CEP17 < 1.8 - gene amplification not observed Ratio: HER2:CEP 17 1.8-2.2 - equivocal result Ratio: HER2:CEP17 > 2.2 - gene amplification observed Jimmy Picket MD Pathologist, Electronic Signature ( Signed 05/28/2012) FINAL DIAGNOSIS Diagnosis Breast, modified radical mastectomy , Right - TUMOR #1 (COIL CLIP) - INVASIVE DUCTAL CARCINOMA, GRADE II (1.9 CM), SEE COMMENT. - INVASIVE CARCINOMA IS 4 CM FROM NEAREST MARGIN (DEEP). - DUCTAL CARCINOMA IN SITU, GRADE III WITH COMEDO NECROSIS AND CALCIFICATIONS. - TUMOR #2 (RIBBON CLIP) - INVASIVE DUCTAL CARCINOMA WITH EXTRAVASATED MUCIN (COLLOID CARCINOMA) (2.5 CM), SEE COMMENT. - INVASIVE TUMOR IS 3 CM FROM NEAREST MARGIN (DEEP). - TUMOR #3 (WING CLIP) - INVASIVE DUCTAL CARCINOMA WITH EXTRAVASATED MUCIN (COLLOID CARCINOMA) (1.8 CM). - INVASIVE TUMOR IS 1.8 CM FROM NEAREST MARGIN (DEEP). - ONE LYMPH NODE, POSITIVE FOR METASTATIC MAMMARY CARCINOMA (1/17). Microscopic Comment BREAST, INVASIVE TUMOR, WITH LYMPH NODE SAMPLING Specimen, including laterality: Right breast Procedure: Modified radial mastectomy Grade: #1 = Grade II; #2 = Grade III; #3 = Grade III Tubule formation: 2;3;3 Nuclear pleomorphism: 3;3;3 Mitotic: 2;2;2 Tumor size (gross measurement): Tumor #1 = 1.9 cm;  tumor #2 = 3.0 cm; tumor #3 = 1.8 cm 2 of 4 Amended copy Amended FINAL for Beam, Milley [phone]) Microscopic Comment(continued) Margins: Invasive, distance to closest margin: 4 cm; 3 cm; 1.8 cm In-situ, distance to closest margin: 4.0 cm; N/A; N/A If margin positive, focally or broadly: N/A Lymphovascular invasion: Present Ductal carcinoma in situ: Present Grade: III Extensive intraductal component: Absent Lobular neoplasia: Absent Tumor focality: Multifocal Treatment effect: None If present, treatment effect in breast tissue, lymph nodes or both: N/A Extent of tumor: Skin: Grossly negative Nipple: Grossly negative Skeletal muscle: N/A Lymph nodes: # examined: 17 Lymph nodes with metastasis: 1 Isolated tumor cells (< 0.2 mm): 0 Micrometastasis: (> 0.2 mm and < 2.0 mm): 0 Macrometastasis: (> 2.0 mm): At least 1.7 cm, see comment Extracapsular extension: Absent see comment Breast prognostic profile: Estrogen receptor: Not repeated, previous studies demonstrated 100% and 100% of positivity ([id number]) Progesterone receptor: Not  repeated, previous studies demonstrated 100% and 99% of positivity ([id number]) Her 2 neu: Repeated, previous study demonstrated no amplification (0.94 and 1.28) ([id number]) Ki-67: Not repeated, previous studies demonstrated 19% and 77% proliferation rate ([id number]) Non-neoplastic breast: Fibrocystic change, pseudoangiomatous hyperplasia, microcalcifications, and fibroadenomatoid nodules. TNM: mpT2, pN1a, pMX Comments: The largest lymph node (3 cm) is the one involved by metastatic mammary carcinoma (slide 1V). The lymph node was submitted in multiple sections for review. The metastatic tumor deposits range from 1.4 to 1.7 cm in these sections. Definitive evidence of extracapsular invasion is not identified. (CRR:caf 05/21/12) [place] RUND DO Pathologist, Electronic Signature   RADIOGRAPHIC STUDIES:  No results found.  ASSESSMENT:  57 year old female with  #1 stage II a multifocal invasive ductal carcinoma of the right breast status post mastectomy with axillary lymph node dissection.  Her final pathology revealed multifocal disease with the largest tumor mass measuring 3.0 cm. One of 17 lymph nodes was positive for metastatic disease.  #2 patient then again adjuvant chemotherapy consisting of Taxotere and Cytoxan given every 3 weeks starting on 06/24/2012 through 10/07/2012. She completed a total of 6 cycles of chemotherapy. She did develop hypokalemia during her therapy as well as lower extremity swelling.  #3 s/p radiation therapy completed on 12/16/12  #4 begin tamoxifen 20 mg daily, patient premenopausal so we will have her take tamoxifen  PLAN:  #1patient proceed with tamoxifen 20 mg  #2 refer to gynecology and ophthamology  #3 I will see her back in 3 months   All questions were answered. The patient knows to call the clinic with any problems, questions or concerns. We can certainly see the patient much sooner if necessary.  I spent 20 minutes counseling the patient face to face. The total time spent in the appointment was .  Drue Second, MD Medical/Oncology [facility] [phone] (beeper) [phone] (Office)  01/07/2013, 10:45 AM

## 2013-01-14 ENCOUNTER — Ambulatory Visit (INDEPENDENT_AMBULATORY_CARE_PROVIDER_SITE_OTHER): Payer: Medicaid Other | Admitting: Sports Medicine

## 2013-01-14 VITALS — BP 110/78 | HR 98 | Temp 98.3°F | Wt 181.0 lb

## 2013-01-14 DIAGNOSIS — J029 Acute pharyngitis, unspecified: Secondary | ICD-10-CM

## 2013-01-14 DIAGNOSIS — J069 Acute upper respiratory infection, unspecified: Secondary | ICD-10-CM

## 2013-01-14 LAB — POCT RAPID STREP A (OFFICE): Rapid Strep A Screen: NEGATIVE

## 2013-01-14 MED ORDER — BENZONATATE 100 MG PO CAPS: 100.0000 mg | ORAL_CAPSULE | Freq: Two times a day (BID) | ORAL | Status: DC | PRN

## 2013-01-14 NOTE — Patient Instructions (Signed)
   Tylenol and Motrin as needed per dosing  Honey will help cough  Tessalon Pearles as needed  Wash your hands      If you need anything prior to your next visit please call the clinic. Please Bring all medications or accurate medication list with you to each appointment; an accurate medication list is essential in providing you the best care possible.

## 2013-01-14 NOTE — Assessment & Plan Note (Addendum)
No red flags, neg rapid strep Symptomatic Treatent Tessalon Pearles if not improving with other somatic treatment per AVS

## 2013-01-14 NOTE — Progress Notes (Signed)
[facility] Tasha Ortiz - 57 y.o. female MRN [id number]  Date of birth: 08-05-1955  CC, HPI, INTERVAL HISTORY & ROS  Tasha Ortiz is here today for evaluation of sore throat    Major Sxs:  cough, sore throat, body aches  Character  nonproductive cough without hemoptysis  No nausea or vomiting  Significant nasal congestion   Onset  3 days, worse 2 days ago, seems to be improving today  Fevers/Chills  no  Rigors  no  N/V  no  Diarrhea  loose stools   Weight Loss    Sick Contacts  no  Other   Therapy Tried  Tylenol otherwise nothing         History  Past Medical, Surgical, Social, and Family History Reviewed per EMR Medications and Allergies reviewed and all updated if necessary. Objective Findings  VITALS: HR: 98 bpm  BP: 110/78 mmHg  TEMP: 98.3 F (36.8 C) (Oral)  RESP:    HT:    WT: 181 lb (82.101 kg)  BMI:     BP Readings from Last 3 Encounters:  01/14/13 110/78  01/07/13 123/81  12/17/12 124/70   Wt Readings from Last 3 Encounters:  01/14/13 181 lb (82.101 kg)  01/07/13 180 lb 11.2 oz (81.965 kg)  12/17/12 179 lb 12.8 oz (81.557 kg)     PHYSICAL EXAM: GENERAL:  adult AA female. In no discomfort; no respiratory distress  PSYCH: alert and appropriate, good insight   HNEENT:  clear nasal exudate, mild posterior oropharyngeal erythema.  Anterior cervical lymphadenopathy.    CARDIO: RRR, S1/S2 heard, no murmur  LUNGS: CTA B, no wheezes, no crackles  ABDOMEN:   EXTREM:  Warm, well perfused.  Moves all 4 extremities spontaneously; no lateralization.    No pretibial edema.  GU:   SKIN:     Assessment & Plan   Problems addressed today: General Plan & Pt Instructions:  1. Sore throat   2. Viral URI with cough       Tylenol and Motrin as needed per dosing  Honey will help cough  Tessalon Pearles as needed  Wash your hands        For further discussion of A/P and for follow up issues see problem based charting if applicable.

## 2013-02-23 ENCOUNTER — Encounter: Payer: Self-pay | Admitting: Family Medicine

## 2013-02-23 ENCOUNTER — Ambulatory Visit (INDEPENDENT_AMBULATORY_CARE_PROVIDER_SITE_OTHER): Payer: Medicaid Other | Admitting: Family Medicine

## 2013-02-23 VITALS — BP 111/78 | HR 76 | Temp 98.6°F | Ht 63.0 in | Wt 179.0 lb

## 2013-02-23 DIAGNOSIS — R112 Nausea with vomiting, unspecified: Secondary | ICD-10-CM

## 2013-02-23 DIAGNOSIS — R1110 Vomiting, unspecified: Secondary | ICD-10-CM

## 2013-02-23 DIAGNOSIS — K219 Gastro-esophageal reflux disease without esophagitis: Secondary | ICD-10-CM

## 2013-02-23 DIAGNOSIS — R109 Unspecified abdominal pain: Secondary | ICD-10-CM

## 2013-02-23 DIAGNOSIS — K5900 Constipation, unspecified: Secondary | ICD-10-CM

## 2013-02-23 MED ORDER — FAMOTIDINE 20 MG PO TABS: 20.0000 mg | ORAL_TABLET | Freq: Two times a day (BID) | ORAL | Status: DC

## 2013-02-23 MED ORDER — POLYETHYLENE GLYCOL 3350 17 GM/SCOOP PO POWD: 17.0000 g | Freq: Two times a day (BID) | ORAL | Status: DC | PRN

## 2013-02-23 NOTE — Assessment & Plan Note (Signed)
Has not taken pepcid regularly, though it did help. Reordered pepcid. EGD previously recommended to her, but never done. If no improvement, would suggest H. pylori test & treat. Dysphagia would suggest need for EGD.

## 2013-02-23 NOTE — Patient Instructions (Signed)
Thank you for coming in today!  There are no signs that these episodes of vomiting were from any worrisome cause. I would not recommend any further evaluation (e.g. H. pylori, EGD etc.) unless treatment doesn't help.  You should take PEPCID twice a day everyday. This has been sent to your pharmacy. It will help with heartburn.  You should take miralax everyday to help with constipation. This may or may not be to blame for the vomiting, but it is important to treat this.   As you leave, make an appointment to follow up with Dr. Paulina Fusi as needed.   Take care and seek immediate care if you develop any concerns.  Please feel free to call with any questions or concerns at any time, at [phone]. - Dr. Jarvis Newcomer

## 2013-02-23 NOTE — Progress Notes (Signed)
Patient ID: Tasha Ortiz, female   DOB: 1956-03-12, 57 y.o.   MRN: [id number] Subjective:  HPI:   Tasha Ortiz is a 57 y.o. female here for abdominal pain and vomiting.   This occurred yesterday with NBNB emesis x2 without much nausea. Abdominal pain was coincident and continued after emesis. Greatly improved with OTC pain medication. It is diffuse, mild-moderate, constant/crampy, non-radiating. She also felt heartburn and related that she has taken pepcid before, but does not currently take it. This has been improved with rolaids.   She finished chemo/XRT for breast CA in September.   She denies fevers, chills, hematemesis, dizziness, BRBPR, melena, diarrhea. Has chronic constipation, LBM was 11/20.   Review of Systems:  Per HPI. All other systems reviewed and are negative.    Objective:  Physical Exam: BP 111/78  Pulse 76  Temp(Src) 98.6 F (37 C) (Oral)  Ht 5\' 3"  (1.6 m)  Wt 179 lb (81.194 kg)  BMI 31.72 kg/m2  Gen: 57 y.o. female in NAD HEENT: MMM, EOMI, PERRL CV: RRR, no MRG Resp: Non-labored, CTAB, no wheezes noted Abd: Soft, NTND, BS present, no guarding or organomegaly MSK: No edema noted, full ROM Neuro: Alert and oriented, CN II-XII grossly intact, speech normal    Assessment:     Tasha Ortiz is a 57 y.o. female with h/o acid reflux, breast CA here for crampy abdominal pain and 2 episodes of emesis.     Plan:     See problem list for problem-specific plans.

## 2013-02-23 NOTE — Assessment & Plan Note (Signed)
Likely not primary contributor to complaints but this is chronic, per pt, and LBM was several days ago. Denies signs/symptoms of bleeding. Normal colonoscopy in 2012. Recommended continuing miralax.

## 2013-02-23 NOTE — Assessment & Plan Note (Signed)
Primary complaint today, not related to nausea. Likely mild gastroenteritis. s/p chemo/radiation for R breast CA, last tx September.

## 2013-03-14 ENCOUNTER — Emergency Department (HOSPITAL_COMMUNITY): Payer: Medicaid Other

## 2013-03-14 ENCOUNTER — Encounter (HOSPITAL_COMMUNITY): Payer: Self-pay | Admitting: Emergency Medicine

## 2013-03-14 ENCOUNTER — Emergency Department (HOSPITAL_COMMUNITY)
Admission: EM | Admit: 2013-03-14 | Discharge: 2013-03-14 | Disposition: A | Discharge status: Home / Self Care | Payer: Medicaid Other | Attending: Emergency Medicine | Admitting: Emergency Medicine

## 2013-03-14 DIAGNOSIS — K529 Noninfective gastroenteritis and colitis, unspecified: Secondary | ICD-10-CM

## 2013-03-14 DIAGNOSIS — I10 Essential (primary) hypertension: Secondary | ICD-10-CM | POA: Insufficient documentation

## 2013-03-14 DIAGNOSIS — Z79899 Other long term (current) drug therapy: Secondary | ICD-10-CM | POA: Insufficient documentation

## 2013-03-14 DIAGNOSIS — Z853 Personal history of malignant neoplasm of breast: Secondary | ICD-10-CM | POA: Insufficient documentation

## 2013-03-14 DIAGNOSIS — Z9851 Tubal ligation status: Secondary | ICD-10-CM | POA: Insufficient documentation

## 2013-03-14 DIAGNOSIS — E7800 Pure hypercholesterolemia, unspecified: Secondary | ICD-10-CM | POA: Insufficient documentation

## 2013-03-14 DIAGNOSIS — K5289 Other specified noninfective gastroenteritis and colitis: Secondary | ICD-10-CM | POA: Insufficient documentation

## 2013-03-14 LAB — URINE MICROSCOPIC-ADD ON

## 2013-03-14 LAB — COMPREHENSIVE METABOLIC PANEL
ALT: 12 U/L (ref 0–35)
AST: 18 U/L (ref 0–37)
Albumin: 4 g/dL (ref 3.5–5.2)
Alkaline Phosphatase: 53 U/L (ref 39–117)
BUN: 12 mg/dL (ref 6–23)
CO2: 25 mEq/L (ref 19–32)
Calcium: 9.7 mg/dL (ref 8.4–10.5)
Chloride: 102 mEq/L (ref 96–112)
Creatinine, Ser: 0.86 mg/dL (ref 0.50–1.10)
GFR calc Af Amer: 87 mL/min — ABNORMAL LOW (ref >90)
GFR calc non Af Amer: 75 mL/min — ABNORMAL LOW (ref >90)
Glucose, Bld: 199 mg/dL — ABNORMAL HIGH (ref 70–99)
Potassium: 3.7 mEq/L (ref 3.5–5.1)
Sodium: 139 mEq/L (ref 135–145)
Total Bilirubin: 0.3 mg/dL (ref 0.3–1.2)
Total Protein: 7.9 g/dL (ref 6.0–8.3)

## 2013-03-14 LAB — CBC WITH DIFFERENTIAL/PLATELET
Basophils Absolute: 0 10*3/uL (ref 0.0–0.1)
Basophils Relative: 0 % (ref 0–1)
Eosinophils Absolute: 0 10*3/uL (ref 0.0–0.7)
Eosinophils Relative: 0 % (ref 0–5)
HCT: 42.5 % (ref 36.0–46.0)
Hemoglobin: 15.2 g/dL — ABNORMAL HIGH (ref 12.0–15.0)
Lymphocytes Relative: 16 % (ref 12–46)
Lymphs Abs: 1.3 10*3/uL (ref 0.7–4.0)
MCH: 30.3 pg (ref 26.0–34.0)
MCHC: 35.8 g/dL (ref 30.0–36.0)
MCV: 84.8 fL (ref 78.0–100.0)
Monocytes Absolute: 0.2 10*3/uL (ref 0.1–1.0)
Monocytes Relative: 2 % — ABNORMAL LOW (ref 3–12)
Neutro Abs: 6.9 10*3/uL (ref 1.7–7.7)
Neutrophils Relative %: 82 % — ABNORMAL HIGH (ref 43–77)
Platelets: 256 10*3/uL (ref 150–400)
RBC: 5.01 MIL/uL (ref 3.87–5.11)
RDW: 13.9 % (ref 11.5–15.5)
WBC: 8.4 10*3/uL (ref 4.0–10.5)

## 2013-03-14 LAB — LIPASE, BLOOD: Lipase: 31 U/L (ref 11–59)

## 2013-03-14 LAB — URINALYSIS, ROUTINE W REFLEX MICROSCOPIC
Glucose, UA: NEGATIVE mg/dL
Hgb urine dipstick: NEGATIVE
Ketones, ur: NEGATIVE mg/dL
Leukocytes, UA: NEGATIVE
Nitrite: NEGATIVE
Protein, ur: 30 mg/dL — AB
Specific Gravity, Urine: 1.03 (ref 1.005–1.030)
Urobilinogen, UA: 1 mg/dL (ref 0.0–1.0)
pH: 6.5 (ref 5.0–8.0)

## 2013-03-14 MED ORDER — IOHEXOL 300 MG/ML  SOLN
100.0000 mL | Freq: Once | INTRAMUSCULAR | Status: AC | PRN
  Administered 2013-03-14: 100 mL via INTRAVENOUS

## 2013-03-14 MED ORDER — AMOXICILLIN-POT CLAVULANATE 875-125 MG PO TABS: 1.0000 | ORAL_TABLET | Freq: Two times a day (BID) | ORAL | Status: DC

## 2013-03-14 MED ORDER — IOHEXOL 300 MG/ML  SOLN
25.0000 mL | INTRAMUSCULAR | Status: DC | PRN
  Administered 2013-03-14: 25 mL via ORAL

## 2013-03-14 MED ORDER — ONDANSETRON HCL 4 MG PO TABS: 4.0000 mg | ORAL_TABLET | Freq: Three times a day (TID) | ORAL | Status: DC | PRN

## 2013-03-14 MED ORDER — ONDANSETRON HCL 4 MG/2ML IJ SOLN
4.0000 mg | Freq: Once | INTRAMUSCULAR | Status: AC
  Administered 2013-03-14: 4 mg via INTRAVENOUS
  Filled 2013-03-14: qty 2

## 2013-03-14 MED ORDER — FENTANYL CITRATE 0.05 MG/ML IJ SOLN
50.0000 ug | INTRAMUSCULAR | Status: DC | PRN
  Administered 2013-03-14: 50 ug via INTRAVENOUS
  Filled 2013-03-14: qty 2

## 2013-03-14 MED ORDER — CIPROFLOXACIN IN D5W 400 MG/200ML IV SOLN
400.0000 mg | Freq: Once | INTRAVENOUS | Status: AC
  Administered 2013-03-14: 400 mg via INTRAVENOUS
  Filled 2013-03-14: qty 200

## 2013-03-14 NOTE — ED Notes (Signed)
Pt. reports mid abdominal pain with emesis and diarrhea onset yesterday , denies fever or chills.

## 2013-03-14 NOTE — ED Provider Notes (Signed)
CSN: [id number]     Arrival date & time 03/14/13  7829 History   First MD Initiated Contact with Patient 03/14/13 0542     Chief Complaint  Patient presents with  . Abdominal Pain   (Consider location/radiation/quality/duration/timing/severity/associated sxs/prior Treatment) HPI History provided by patient. Mid abdominal pain with associated nausea and vomiting onset last night. Symptoms persisted today. No fevers or chills. No rash. No trauma. She denies any recent diarrhea or constipation. She does have history of C-section but denies any other abdominal surgeries. Pain is sharp in quality and moderate to severe. No known sick contacts. Past Medical History  Diagnosis Date  . Hypertension   . Hypercholesteremia     taken off chol meds Dec 2012  . Acid reflux   . Breast cancer   . Heartburn    Past Surgical History  Procedure Laterality Date  . Cesarean section  25 years ago  . Tubal ligation    . Mastectomy Right   . Modified mastectomy Right 05/20/2012    Procedure: MODIFIED MASTECTOMY;  Surgeon: Mariella Saa, MD;  Location: [facility] OR;  Service: General;  Laterality: Right;  . Portacath placement Left 05/20/2012    Procedure: INSERTION PORT-A-CATH;  Surgeon: Mariella Saa, MD;  Location: [facility] OR;  Service: General;  Laterality: Left;  . Breast surgery Right 05/20/12    Rt br mastectomy   Family History  Problem Relation Age of Onset  . Hypertension Mother   . Diabetes Daughter   . Cancer Sister     brain  . Cancer Cousin     breast   History  Substance Use Topics  . Smoking status: Never Smoker   . Smokeless tobacco: Former Neurosurgeon    Types: Chew    Quit date: 11/18/1996  . Alcohol Use: No   OB History   Grav Para Term Preterm Abortions TAB SAB Ect Mult Living   2 2 2       2      Review of Systems  Constitutional: Negative for fever and chills.  Respiratory: Negative for shortness of breath.   Cardiovascular: Negative for chest pain.  Gastrointestinal:  Positive for vomiting and abdominal pain.  Genitourinary: Negative for dysuria.  Musculoskeletal: Negative for back pain, neck pain and neck stiffness.  Skin: Negative for rash.  Neurological: Negative for headaches.  All other systems reviewed and are negative.    Allergies  Review of patient's allergies indicates no known allergies.  Home Medications   Current Outpatient Rx  Name  Route  Sig  Dispense  Refill  . acetaminophen (TYLENOL) 325 MG tablet   Oral   Take 2 tablets (650 mg total) by mouth every 6 (six) hours as needed for pain.   60 tablet   2   . famotidine (PEPCID) 20 MG tablet   Oral   Take 1 tablet (20 mg total) by mouth 2 (two) times daily.   60 tablet   3   . lisinopril-hydrochlorothiazide (PRINZIDE,ZESTORETIC) 20-25 MG per tablet      TAKE ONE TABLET BY MOUTH EVERY DAY   90 tablet   1   . polyethylene glycol powder (GLYCOLAX/MIRALAX) powder   Oral   Take 17 g by mouth 2 (two) times daily as needed.   3350 g   1   . pravastatin (PRAVACHOL) 40 MG tablet      TAKE ONE TABLET BY MOUTH EVERY DAY   90 tablet   3   . sodium chloride (OCEAN) 0.65 %  nasal spray   Nasal   Place 4 sprays into the nose daily as needed. For congestion.         . tamoxifen (NOLVADEX) 10 MG tablet   Oral   Take 10 mg by mouth daily.         Marland Kitchen guaiFENesin (MUCINEX) 600 MG 12 hr tablet   Oral   Take 1,200 mg by mouth 2 (two) times daily as needed. For cough          BP 129/68  Pulse 96  Temp(Src) 98.8 F (37.1 C) (Oral)  Resp 18  SpO2 97% Physical Exam  Constitutional: She is oriented to person, place, and time. She appears well-developed and well-nourished.  HENT:  Head: Normocephalic and atraumatic.  Eyes: EOM are normal. Pupils are equal, round, and reactive to light.  Neck: Neck supple.  Cardiovascular: Normal rate, regular rhythm and intact distal pulses.   Pulmonary/Chest: Effort normal and breath sounds normal. No respiratory distress.  Abdominal:  Soft. She exhibits no mass.  Decreased bowel sounds. Moderate diffuse abdominal tenderness, more so left lower quadrant  Musculoskeletal: Normal range of motion. She exhibits no edema.  Neurological: She is alert and oriented to person, place, and time.  Skin: Skin is warm and dry.    ED Course  Procedures (including critical care time) Labs Review Labs Reviewed  CBC WITH DIFFERENTIAL - Abnormal; Notable for the following:    Hemoglobin 15.2 (*)    Neutrophils Relative % 82 (*)    Monocytes Relative 2 (*)    All other components within normal limits  COMPREHENSIVE METABOLIC PANEL - Abnormal; Notable for the following:    Glucose, Bld 199 (*)    GFR calc non Af Amer 75 (*)    GFR calc Af Amer 87 (*)    All other components within normal limits  LIPASE, BLOOD  URINALYSIS, ROUTINE W REFLEX MICROSCOPIC   Imaging Review Ct Abdomen Pelvis W Contrast  03/14/2013   CLINICAL DATA:  Mid abdominal pain, vomiting, diarrhea, prior history of breast cancer, status post right mastectomy  EXAM: CT ABDOMEN AND PELVIS WITH CONTRAST  TECHNIQUE: Multidetector CT imaging of the abdomen and pelvis was performed using the standard protocol following bolus administration of intravenous contrast.  CONTRAST:  OMNIPAQUE IOHEXOL 300 MG/ML  SOLN  COMPARISON:  11/13/2011 abdominal ultrasound  FINDINGS: Right breast prosthesis noted over the chest. Minimal bibasilar atelectasis. Normal heart size. No pericardial or pleural effusion.  Abdomen: Liver, gallbladder, biliary system, pancreas, spleen, adrenal glands, and kidneys are within normal limits for age and demonstrate no acute process.  In the left upper quadrant, loops of proximal jejunum demonstrated diffuse wall thickening, surrounding edema, mucosal enhancement, and vascular congestion. This is best demonstrated on images 22 through 46. Findings compatible with acute small bowel enteritis. No associated obstruction pattern as there is oral contrast distal  to this area within the small bowel. No pneumatosis, perforation or free air. Negative for abscess. Small amount of free fluid in the left upper quadrant along the spleen, image 15 and also along the inferior right liver margin and the right pericolic gutter.  Aortic atherosclerosis noted without aneurysm. Mesenteric vessels appear patent appear.  Pelvis: Small amount of pelvic free fluid dependently on the right. Uterus and adnexal normal in size. Urinary bladder unremarkable. No pelvic abscess, hemorrhage, adenopathy, inguinal abnormality, or hernia.  Minor lumbar degenerative change and facet arthropathy.  IMPRESSION: Acute enteritis involving loops of proximal jejunum in the left upper quadrant  with bowel wall thickening, surrounding edema, and mucosal enhancement. This usually is infectious or inflammatory. Mesenteric vessels appear patent therefore likely not ischemic.  Associated intra-abdominal and pelvic free fluid likely reactive from the acute enteritis  No associated obstruction, perforation, or abscess  Prior right mastectomy   Electronically Signed   By: Ruel Favors M.D.   On: 03/14/2013 09:34   IV fentanyl and Zofran  Condition improved with medications MDM  Diagnosis: Enteritis  Labs and imaging obtained IV narcotics provided Vital signs nurse's notes reviewed and considered  Sunnie Nielsen, MD 03/15/13 [phone]

## 2013-03-14 NOTE — ED Notes (Signed)
Patient transported to CT 

## 2013-03-14 NOTE — ED Notes (Signed)
Pt reports abd pain vomiting and some diarrhea. Onset last night

## 2013-03-14 NOTE — ED Notes (Signed)
Called CT, informed pt finished drinking contrast and ready to go.

## 2013-03-16 NOTE — ED Provider Notes (Signed)
  Physical Exam  BP 134/73  Pulse 77  Temp(Src) 98.8 F (37.1 C) (Oral)  Resp 19  SpO2 100%  Physical Exam  ED Course  Procedures  MDM  Hand-off from Dr. Dierdre Highman, MD. Awaiting CT scan results. Pt with an approx 24 hour history of vomiting and diarrhea.   Abdomen/pelvis CT; acute enteritis. No obstruction, perforation or abscess. Pt Afebrile. VS stable. Feeling better after zofran. Prescriptions for zofran and augmentin. Advised clear liquids and advance to bland foods then gradual advancement to normal diet. Pt feels ok to go home. Stable for discharge.       Irish Elders, NP 03/16/13 2216

## 2013-03-17 NOTE — ED Provider Notes (Signed)
Medical screening examination/treatment/procedure(s) were performed by non-physician practitioner and as supervising physician I was immediately available for consultation/collaboration.  Warden Buffa T Kahleb Mcclane, MD 03/17/13 0816 

## 2013-03-27 ENCOUNTER — Encounter (HOSPITAL_COMMUNITY): Payer: Self-pay | Admitting: Emergency Medicine

## 2013-03-27 ENCOUNTER — Emergency Department (HOSPITAL_COMMUNITY): Payer: Medicaid Other

## 2013-03-27 ENCOUNTER — Emergency Department (HOSPITAL_COMMUNITY)
Admission: EM | Admit: 2013-03-27 | Discharge: 2013-03-28 | Disposition: A | Discharge status: Home / Self Care | Payer: Medicaid Other | Attending: Emergency Medicine | Admitting: Emergency Medicine

## 2013-03-27 DIAGNOSIS — Z79899 Other long term (current) drug therapy: Secondary | ICD-10-CM | POA: Insufficient documentation

## 2013-03-27 DIAGNOSIS — R55 Syncope and collapse: Secondary | ICD-10-CM

## 2013-03-27 DIAGNOSIS — R1110 Vomiting, unspecified: Secondary | ICD-10-CM | POA: Insufficient documentation

## 2013-03-27 DIAGNOSIS — K219 Gastro-esophageal reflux disease without esophagitis: Secondary | ICD-10-CM | POA: Insufficient documentation

## 2013-03-27 DIAGNOSIS — E7800 Pure hypercholesterolemia, unspecified: Secondary | ICD-10-CM | POA: Insufficient documentation

## 2013-03-27 DIAGNOSIS — I10 Essential (primary) hypertension: Secondary | ICD-10-CM | POA: Insufficient documentation

## 2013-03-27 DIAGNOSIS — Z853 Personal history of malignant neoplasm of breast: Secondary | ICD-10-CM | POA: Insufficient documentation

## 2013-03-27 LAB — CBC WITH DIFFERENTIAL/PLATELET
Basophils Absolute: 0 10*3/uL (ref 0.0–0.1)
Basophils Relative: 0 % (ref 0–1)
Eosinophils Absolute: 0.1 10*3/uL (ref 0.0–0.7)
Eosinophils Relative: 1 % (ref 0–5)
HCT: 39.7 % (ref 36.0–46.0)
Hemoglobin: 14 g/dL (ref 12.0–15.0)
Lymphocytes Relative: 30 % (ref 12–46)
Lymphs Abs: 2.8 10*3/uL (ref 0.7–4.0)
MCH: 29.7 pg (ref 26.0–34.0)
MCHC: 35.3 g/dL (ref 30.0–36.0)
MCV: 84.1 fL (ref 78.0–100.0)
Monocytes Absolute: 0.5 10*3/uL (ref 0.1–1.0)
Monocytes Relative: 5 % (ref 3–12)
Neutro Abs: 5.8 10*3/uL (ref 1.7–7.7)
Neutrophils Relative %: 63 % (ref 43–77)
Platelets: 254 10*3/uL (ref 150–400)
RBC: 4.72 MIL/uL (ref 3.87–5.11)
RDW: 13.3 % (ref 11.5–15.5)
WBC: 9.1 10*3/uL (ref 4.0–10.5)

## 2013-03-27 LAB — BASIC METABOLIC PANEL
BUN: 11 mg/dL (ref 6–23)
CO2: 23 mEq/L (ref 19–32)
Calcium: 9.9 mg/dL (ref 8.4–10.5)
Chloride: 98 mEq/L (ref 96–112)
Creatinine, Ser: 1.03 mg/dL (ref 0.50–1.10)
GFR calc Af Amer: 70 mL/min — ABNORMAL LOW (ref >90)
GFR calc non Af Amer: 60 mL/min — ABNORMAL LOW (ref >90)
Glucose, Bld: 167 mg/dL — ABNORMAL HIGH (ref 70–99)
Potassium: 2.8 mEq/L — ABNORMAL LOW (ref 3.5–5.1)
Sodium: 136 mEq/L (ref 135–145)

## 2013-03-27 LAB — GLUCOSE, CAPILLARY: Glucose-Capillary: 161 mg/dL — ABNORMAL HIGH (ref 70–99)

## 2013-03-27 MED ORDER — SODIUM CHLORIDE 0.9 % IV BOLUS (SEPSIS)
1000.0000 mL | Freq: Once | INTRAVENOUS | Status: AC
  Administered 2013-03-28: 1000 mL via INTRAVENOUS

## 2013-03-27 MED ORDER — POTASSIUM CHLORIDE CRYS ER 20 MEQ PO TBCR
60.0000 meq | EXTENDED_RELEASE_TABLET | Freq: Once | ORAL | Status: AC
  Administered 2013-03-28: 60 meq via ORAL
  Filled 2013-03-27: qty 3

## 2013-03-27 NOTE — ED Notes (Signed)
Pt was here with another patient and suddenly had altered mental status and was incontinent, care giver in the room stated that it looked like seizure activity, no hx of seizures

## 2013-03-27 NOTE — ED Notes (Signed)
Pt brought from ED 25 while visiting family, per staff pt became altered then unresponsive. Urinary incontinence noted, pt alert on arrival to room.

## 2013-03-27 NOTE — ED Provider Notes (Addendum)
CSN: [id number]     Arrival date & time 03/27/13  2217 History   First MD Initiated Contact with Patient 03/27/13 2221     Chief Complaint  Patient presents with  . Altered Mental Status   (Consider location/radiation/quality/duration/timing/severity/associated sxs/prior Treatment) Patient is a 57 y.o. female presenting with syncope. The history is provided by the patient. No language interpreter was used.  Loss of Consciousness Episode history:  Single Most recent episode:  Today Duration:  5 seconds Timing:  Rare Progression:  Resolved Chronicity:  New Context: standing up   Witnessed: yes   Relieved by:  Lying down Worsened by:  Nothing tried Ineffective treatments:  None tried Associated symptoms: vomiting   Associated symptoms: no chest pain, no confusion, no diaphoresis, no fever, no headaches, no nausea, no palpitations, no shortness of breath and no weakness   Associated symptoms comment:  Urinary incontinence, diaphoresis Vomiting:    Number of occurrences:  1   Severity:  Mild   Timing:  Rare   Progression:  Resolved Risk factors: no congenital heart disease, no coronary artery disease, no seizures and no vascular disease     Past Medical History  Diagnosis Date  . Hypertension   . Hypercholesteremia     taken off chol meds Dec 2012  . Acid reflux   . Breast cancer   . Heartburn    Past Surgical History  Procedure Laterality Date  . Cesarean section  25 years ago  . Tubal ligation    . Mastectomy Right   . Modified mastectomy Right 05/20/2012    Procedure: MODIFIED MASTECTOMY;  Surgeon: Mariella Saa, MD;  Location: [facility] OR;  Service: General;  Laterality: Right;  . Portacath placement Left 05/20/2012    Procedure: INSERTION PORT-A-CATH;  Surgeon: Mariella Saa, MD;  Location: [facility] OR;  Service: General;  Laterality: Left;  . Breast surgery Right 05/20/12    Rt br mastectomy   Family History  Problem Relation Age of Onset  . Hypertension Mother    . Diabetes Daughter   . Cancer Sister     brain  . Cancer Cousin     breast   History  Substance Use Topics  . Smoking status: Never Smoker   . Smokeless tobacco: Former Neurosurgeon    Types: Chew    Quit date: 11/18/1996  . Alcohol Use: No   OB History   Grav Para Term Preterm Abortions TAB SAB Ect Mult Living   2 2 2       2      Review of Systems  Constitutional: Negative for fever, chills, diaphoresis, activity change, appetite change and fatigue.  HENT: Negative for congestion, facial swelling, rhinorrhea and sore throat.   Eyes: Negative for photophobia and discharge.  Respiratory: Negative for cough, chest tightness and shortness of breath.   Cardiovascular: Positive for syncope. Negative for chest pain, palpitations and leg swelling.  Gastrointestinal: Positive for vomiting. Negative for nausea, abdominal pain and diarrhea.  Endocrine: Negative for polydipsia and polyuria.  Genitourinary: Negative for dysuria, frequency, difficulty urinating and pelvic pain.  Musculoskeletal: Negative for arthralgias, back pain, neck pain and neck stiffness.  Skin: Negative for color change and wound.  Allergic/Immunologic: Negative for immunocompromised state.  Neurological: Positive for syncope. Negative for facial asymmetry, weakness, numbness and headaches.  Hematological: Does not bruise/bleed easily.  Psychiatric/Behavioral: Negative for confusion and agitation.    Allergies  Review of patient's allergies indicates no known allergies.  Home Medications   Current Outpatient  Rx  Name  Route  Sig  Dispense  Refill  . acetaminophen (TYLENOL) 325 MG tablet   Oral   Take 2 tablets (650 mg total) by mouth every 6 (six) hours as needed for pain.   60 tablet   2   . famotidine (PEPCID) 20 MG tablet   Oral   Take 1 tablet (20 mg total) by mouth 2 (two) times daily.   60 tablet   3   . guaiFENesin (MUCINEX) 600 MG 12 hr tablet   Oral   Take 1,200 mg by mouth 2 (two) times daily  as needed. For cough         . lisinopril-hydrochlorothiazide (PRINZIDE,ZESTORETIC) 20-25 MG per tablet      TAKE ONE TABLET BY MOUTH EVERY DAY   90 tablet   1   . ondansetron (ZOFRAN) 4 MG tablet   Oral   Take 1 tablet (4 mg total) by mouth every 8 (eight) hours as needed for nausea or vomiting.   20 tablet   0   . polyethylene glycol powder (GLYCOLAX/MIRALAX) powder   Oral   Take 17 g by mouth 2 (two) times daily as needed.   3350 g   1   . pravastatin (PRAVACHOL) 40 MG tablet      TAKE ONE TABLET BY MOUTH EVERY DAY   90 tablet   3   . sodium chloride (OCEAN) 0.65 % nasal spray   Nasal   Place 4 sprays into the nose daily as needed. For congestion.         . tamoxifen (NOLVADEX) 10 MG tablet   Oral   Take 10 mg by mouth daily.          BP 106/58  Pulse 76  Temp(Src) 98.6 F (37 C) (Oral)  Resp 20  SpO2 97% Physical Exam  Constitutional: She is oriented to person, place, and time. She appears well-developed and well-nourished. No distress.  HENT:  Head: Normocephalic and atraumatic.  Mouth/Throat: No oropharyngeal exudate.  Eyes: Pupils are equal, round, and reactive to light.  Neck: Normal range of motion. Neck supple.  Cardiovascular: Normal rate, regular rhythm and normal heart sounds.  Exam reveals no gallop and no friction rub.   No murmur heard. Pulmonary/Chest: Effort normal and breath sounds normal. No respiratory distress. She has no wheezes. She has no rales.  Abdominal: Soft. Bowel sounds are normal. She exhibits no distension and no mass. There is no tenderness. There is no rebound and no guarding.  Musculoskeletal: Normal range of motion. She exhibits no edema and no tenderness.  Neurological: She is alert and oriented to person, place, and time. She has normal strength. She displays no tremor. No cranial nerve deficit or sensory deficit. She exhibits normal muscle tone. She displays a negative Romberg sign. Coordination normal. GCS eye  subscore is 4. GCS verbal subscore is 5. GCS motor subscore is 6.  Skin: Skin is warm and dry.  Psychiatric: She has a normal mood and affect.    ED Course  Procedures (including critical care time) Labs Review Labs Reviewed  GLUCOSE, CAPILLARY - Abnormal; Notable for the following:    Glucose-Capillary 161 (*)    All other components within normal limits  BASIC METABOLIC PANEL - Abnormal; Notable for the following:    Potassium 2.8 (*)    Glucose, Bld 167 (*)    GFR calc non Af Amer 60 (*)    GFR calc Af Amer 70 (*)  All other components within normal limits  CBC WITH DIFFERENTIAL   Imaging Review Ct Head Wo Contrast  03/28/2013   CLINICAL DATA:  Seizure.  EXAM: CT HEAD WITHOUT CONTRAST  TECHNIQUE: Contiguous axial images were obtained from the base of the skull through the vertex without intravenous contrast.  COMPARISON:  04/06/2012  FINDINGS: The cerebral and cerebellar hemispheres have a normal attenuation and morphology. No midline shift, ventriculomegaly, mass effect, evidence of mass lesion, intracranial hemorrhage or evidence of cortically based acute infarction. Gray-white matter differentiation is within normal limits throughout the brain. The paranasal sinuses and mastoid air cells are clear. The calvarium appears intact.  IMPRESSION: 1. No acute intracranial abnormalities.   Electronically Signed   By: Signa Kell M.D.   On: 03/28/2013 00:17   Dg Chest Portable 1 View  03/28/2013   CLINICAL DATA:  Altered mental status  EXAM: PORTABLE CHEST - 1 VIEW  COMPARISON:  03/19/2013  FINDINGS: Left chest wall port a catheter is noted with tip in the projection the SVC. The heart size is normal. No pleural effusion or edema. No airspace consolidation identified.  Review of the visualized osseous structures is unremarkable.  IMPRESSION: 1. No acute cardiopulmonary abnormalities.   Electronically Signed   By: Signa Kell M.D.   On: 03/28/2013 01:07    EKG Interpretation    None       MDM   1. Transient loss of consciousness    Pt is a 57 y.o. female with Pmhx as above who presents with acute episode of LOC while visiting another ED pt.  Per NA who was w/ pt, she had walked back in room, stated she felt hot, then had drooping of L side of face, eye twitching, then upper extremity twitching, followed by LOC that was witnessed by myself w/ episode of emesis and urinary incontinence.  She quickly regained consciousness, was GCS 15 and had not focal neuro findings. Pt states she has episodes of near syncope, but no hx of syncope. She has had little PO intake today, recent visit for n/v, d/a.    12:42AM Pt feeling well, tolerating PO. K replaced.  It is unclear if episode represented syncope or seizure. Labs otherwise unremarkable CT head negative.  CXR negative. Will D/C home plan for outpt PCP and neurology follow up. Return precautions given for new or worsening symptoms including further episodes, Cp, SOB, numbness, weakness, fever.  I doubt CVA/TIA, ACS.         Shanna Cisco, MD 03/28/13 7846  Shanna Cisco, MD 03/28/13 [phone]

## 2013-03-28 ENCOUNTER — Emergency Department (HOSPITAL_COMMUNITY): Payer: Medicaid Other

## 2013-03-28 NOTE — ED Notes (Signed)
pts daughter called for d/c and clean clothes, message left Angie [phone]

## 2013-04-07 ENCOUNTER — Ambulatory Visit (INDEPENDENT_AMBULATORY_CARE_PROVIDER_SITE_OTHER): Payer: Self-pay | Admitting: Family Medicine

## 2013-04-07 ENCOUNTER — Encounter: Payer: Self-pay | Admitting: Family Medicine

## 2013-04-07 VITALS — BP 120/81 | HR 73 | Temp 98.5°F | Ht 63.0 in | Wt 174.0 lb

## 2013-04-07 DIAGNOSIS — R5383 Other fatigue: Secondary | ICD-10-CM

## 2013-04-07 DIAGNOSIS — R5381 Other malaise: Secondary | ICD-10-CM

## 2013-04-07 DIAGNOSIS — R531 Weakness: Secondary | ICD-10-CM | POA: Insufficient documentation

## 2013-04-07 DIAGNOSIS — F4541 Pain disorder exclusively related to psychological factors: Secondary | ICD-10-CM | POA: Insufficient documentation

## 2013-04-07 DIAGNOSIS — G44209 Tension-type headache, unspecified, not intractable: Secondary | ICD-10-CM

## 2013-04-07 DIAGNOSIS — R55 Syncope and collapse: Secondary | ICD-10-CM

## 2013-04-07 LAB — CBC WITH DIFFERENTIAL/PLATELET
Basophils Absolute: 0 10*3/uL (ref 0.0–0.1)
Basophils Relative: 0 % (ref 0–1)
Eosinophils Absolute: 0.1 10*3/uL (ref 0.0–0.7)
Eosinophils Relative: 3 % (ref 0–5)
HCT: 37.8 % (ref 36.0–46.0)
Hemoglobin: 13.2 g/dL (ref 12.0–15.0)
Lymphocytes Relative: 30 % (ref 12–46)
Lymphs Abs: 1.1 10*3/uL (ref 0.7–4.0)
MCH: 29.1 pg (ref 26.0–34.0)
MCHC: 34.9 g/dL (ref 30.0–36.0)
MCV: 83.4 fL (ref 78.0–100.0)
Monocytes Absolute: 0.2 10*3/uL (ref 0.1–1.0)
Monocytes Relative: 6 % (ref 3–12)
Neutro Abs: 2.2 10*3/uL (ref 1.7–7.7)
Neutrophils Relative %: 61 % (ref 43–77)
Platelets: 252 10*3/uL (ref 150–400)
RBC: 4.53 MIL/uL (ref 3.87–5.11)
RDW: 13.5 % (ref 11.5–15.5)
WBC: 3.7 10*3/uL — ABNORMAL LOW (ref 4.0–10.5)

## 2013-04-07 NOTE — Progress Notes (Signed)
Patient ID: ZAKIA SAINATO, female   DOB: 05-28-1955, 58 y.o.   MRN: [id number] . Subjective:  HPI:   GYANNA JAREMA is a 58 y.o. female with a history of breast cancer s/p chemo/XRT, HTN, HLP here for hospital follow up.  She was seen in the [place] ED on 12/26 after a transient loss of consciousness. Ct head and CXR were negative for acute processes. Glucose was in 100s. She felt warm for several seconds before hand. She has not had a good diet and had not been drinking any water prior to that episode. Denies any similar episodes since that time but has felt somewhat weak and dizzy at times. She denies fevers, chills, cough, nasal drainage, myalgias, nausea, vomiting and diarrhea.   She also endorses a constant mild-moderate headache most days for the past couple weeks located in the back of the head and goes to the top of the head. She drinks coffee every morning but has had several days where she did not have any coffee.   Review of Systems:  Per HPI. All other systems reviewed and are negative.    Objective:  Physical Exam: BP 120/81  Pulse 73  Temp(Src) 98.5 F (36.9 C) (Oral)  Ht 5\' 3"  (1.6 m)  Wt 174 lb (78.926 kg)  BMI 30.83 kg/m2  Gen: Obese 58 y.o. female in NAD HEENT: MMM, EOMI, PERRL, no conjunctival pallor CV: RRR, no MRG Resp: Non-labored, CTAB, no wheezes noted Abd: Soft, NTND, BS present, no guarding or organomegaly MSK: + L > R bilateral trapezius spasm. No edema noted, full ROM. Neuro: Alert and oriented, CN II-XII intact, speech normal, vision normal, rhomberg neg.     Assessment:     SHANIKKA WONDERS is a 58 y.o. female here for hospital follow up.     Plan:     See problem list for problem-specific plans. Additionally, neurology appt 04/27/13 and Onc appt with Dr. Chancy Milroy later this week.

## 2013-04-07 NOTE — Patient Instructions (Addendum)
Thank you for coming in today!  We are checking some labs and I will call you with the results or send you a letter if they are normal. Dr. Chancy Milroy will be able to see the results of these tests.  You may need to continue taking potassium by mouth.  I recommend you continue to stay hydrated and eat well, lots of fruits and vegetables.  Your headache/head pressure sounds likely due to withdrawal of caffeine and stress. You can take excedrin over the counter for this and see if it helps. If it is past 5pm or so, you can just take ibuprofen or tylenol for this, because excedrin has caffeine in it and may keep you awake.   Take care and follow up with your primary care doctor as needed. Go to the hospital if you experience any weakness on one side of your body or slurred speech.  Please feel free to call with any questions or concerns at any time, at [phone]. - Dr. Bonner Puna

## 2013-04-07 NOTE — Assessment & Plan Note (Signed)
Trapezius muscle spasm, typical symptoms. Recommended exercise, massage, and OTC excedrin.

## 2013-04-07 NOTE — Assessment & Plan Note (Signed)
BMP, mag, CBC to investigate reversible causes. Remote side effect of chemo/XRT and poor diet/hydration vs. anemia vs. electrolyte disturbance.

## 2013-04-08 LAB — BASIC METABOLIC PANEL
BUN: 8 mg/dL (ref 6–23)
CO2: 27 mEq/L (ref 19–32)
Calcium: 9.6 mg/dL (ref 8.4–10.5)
Chloride: 101 mEq/L (ref 96–112)
Creat: 0.82 mg/dL (ref 0.50–1.10)
Glucose, Bld: 147 mg/dL — ABNORMAL HIGH (ref 70–99)
Potassium: 3.2 mEq/L — ABNORMAL LOW (ref 3.5–5.3)
Sodium: 137 mEq/L (ref 135–145)

## 2013-04-08 LAB — MAGNESIUM: Magnesium: 1.9 mg/dL (ref 1.5–2.5)

## 2013-04-09 ENCOUNTER — Encounter: Payer: Self-pay | Admitting: Oncology

## 2013-04-09 ENCOUNTER — Telehealth: Payer: Self-pay | Admitting: *Deleted

## 2013-04-09 ENCOUNTER — Ambulatory Visit (HOSPITAL_BASED_OUTPATIENT_CLINIC_OR_DEPARTMENT_OTHER): Payer: Medicaid Other | Admitting: Oncology

## 2013-04-09 ENCOUNTER — Other Ambulatory Visit (HOSPITAL_BASED_OUTPATIENT_CLINIC_OR_DEPARTMENT_OTHER): Payer: Medicaid Other

## 2013-04-09 VITALS — BP 134/81 | HR 72 | Temp 98.3°F | Resp 18 | Ht 63.0 in | Wt 173.2 lb

## 2013-04-09 DIAGNOSIS — C50419 Malignant neoplasm of upper-outer quadrant of unspecified female breast: Secondary | ICD-10-CM

## 2013-04-09 DIAGNOSIS — Z170 Estrogen receptor positive status [ER+]: Secondary | ICD-10-CM

## 2013-04-09 DIAGNOSIS — C50411 Malignant neoplasm of upper-outer quadrant of right female breast: Secondary | ICD-10-CM

## 2013-04-09 DIAGNOSIS — C773 Secondary and unspecified malignant neoplasm of axilla and upper limb lymph nodes: Secondary | ICD-10-CM

## 2013-04-09 LAB — COMPREHENSIVE METABOLIC PANEL (CC13)
ALT: 32 U/L (ref 0–55)
AST: 25 U/L (ref 5–34)
Albumin: 3.8 g/dL (ref 3.5–5.0)
Alkaline Phosphatase: 46 U/L (ref 40–150)
Anion Gap: 13 mEq/L — ABNORMAL HIGH (ref 3–11)
BUN: 7.5 mg/dL (ref 7.0–26.0)
CO2: 24 mEq/L (ref 22–29)
Calcium: 9.7 mg/dL (ref 8.4–10.4)
Chloride: 101 mEq/L (ref 98–109)
Creatinine: 0.9 mg/dL (ref 0.6–1.1)
Glucose: 170 mg/dl — ABNORMAL HIGH (ref 70–140)
Potassium: 3.4 mEq/L — ABNORMAL LOW (ref 3.5–5.1)
Sodium: 138 mEq/L (ref 136–145)
Total Bilirubin: 0.32 mg/dL (ref 0.20–1.20)
Total Protein: 7.3 g/dL (ref 6.4–8.3)

## 2013-04-09 LAB — CBC WITH DIFFERENTIAL/PLATELET
BASO%: 0.6 % (ref 0.0–2.0)
Basophils Absolute: 0 10*3/uL (ref 0.0–0.1)
EOS%: 2 % (ref 0.0–7.0)
Eosinophils Absolute: 0.1 10*3/uL (ref 0.0–0.5)
HCT: 37.3 % (ref 34.8–46.6)
HGB: 12.9 g/dL (ref 11.6–15.9)
LYMPH%: 34.5 % (ref 14.0–49.7)
MCH: 29.9 pg (ref 25.1–34.0)
MCHC: 34.6 g/dL (ref 31.5–36.0)
MCV: 86.4 fL (ref 79.5–101.0)
MONO#: 0.2 10*3/uL (ref 0.1–0.9)
MONO%: 6.6 % (ref 0.0–14.0)
NEUT#: 2.1 10*3/uL (ref 1.5–6.5)
NEUT%: 56.3 % (ref 38.4–76.8)
Platelets: 246 10*3/uL (ref 145–400)
RBC: 4.32 10*6/uL (ref 3.70–5.45)
RDW: 12.9 % (ref 11.2–14.5)
WBC: 3.7 10*3/uL — ABNORMAL LOW (ref 3.9–10.3)
lymph#: 1.3 10*3/uL (ref 0.9–3.3)

## 2013-04-09 NOTE — Patient Instructions (Addendum)
Continue tamoxifen 20 mg daily  We will see you back in 6 months  Port removal by Dr. Excell Seltzer

## 2013-04-09 NOTE — Progress Notes (Signed)
Patient left message for me to call her back. She wants to come in about possible asst. I called and left her a message to call me

## 2013-04-09 NOTE — Telephone Encounter (Signed)
appts made and printed...td 

## 2013-04-09 NOTE — Progress Notes (Signed)
OFFICE PROGRESS NOTE  CCKennith Maes, DO [place] [address]  DIAGNOSIS: 58 year old female with stage IIB right breast cancer, ER positive, PR positive, HER-2/neu negative. Node positive   PRIOR THERAPY:  #1 .Who was seen in the multidisciplinary breast clinic for consultation. Patient noted a mass in the right breast about 4 months ago. She's had a mammogram every year on an annual basis. On her prior mammogram there were no suspicious findings. But because of the mass she presented for workup for the palpable mass. On mammogram there was noted to be an abnormality at the 11:00 position in the right breast corresponding to the palpable abnormality. There were also noted to be 2 additional suspicious masses within the right breast as well as abnormal right axillary lymph nodes. Patient went on to have an ultrasound-guided core needle biopsy of all 3 masses and the axillary lymph node. All 3 masses were positive for invasive ductal carcinoma. 2 primaries appear to be HER-2/neu negative ER positive PR +1 of the primaries demonstrated mucinous features with Ki-67 20%. The lymph node had a higher Ki-67 of 70%. Indicating there were 2 synchronous primaries. Patient underwent MRI of the breasts. Again 3 lesions were noted within the right breast. The larger mass was at the 10:00 position measuring 2.3 cm. An abnormal level I right axillary lymph node measures 3.0 cm. No abnormalities in the contralateral breast   #2 Patient is now status post right modified radical mastectomy. She was found to have multifocal disease. Tumor was ER PR positive HER-2/neu negative. She had one of 17 lymph nodes positive for metastatic disease. We discussed her pathology in detail today.   #3. Status post adjuvant chemotherapy consisting of Taxotere Cytoxan every 6 weeks with day 2 Neulasta. She has received a total of 6 cycles from 06/24/2012 through 10/07/2012.  #4 S/P adjuvant radiation  administered by Dr. [name] from 10/16/2012  - 12/16/12   #5.   Curative intent adjuvant tamoxifen 20 mg daily beginning 01/07/2013 for 5-10, we discussed rational/risks/benefits of tamoxifen. Patient consented to treatment.   CURRENT THERAPY: tamoxifen 20 mg daily beginning 01/07/13  INTERVAL HISTORY: Tasha Ortiz 58 y.o. female returns for f/u Overall she is doing well without any problems. She denies any fevers chills night sweats headaches shortness of breath chest pains palpitations. She has been on tamoxifen now for about 3 months. So far she is tolerating it well without any significant problems. We reviewed the side effects again. She has not noticed any lower extremity swelling. No cramping. She does have some hot flashes. She has no peripheral paresthesias no easy bruising remainder of the 10 point review of systems is negative.  MEDICAL HISTORY: Past Medical History  Diagnosis Date  . Hypertension   . Hypercholesteremia     taken off chol meds Dec 2012  . Acid reflux   . Breast cancer   . Heartburn     ALLERGIES:  has No Known Allergies.  MEDICATIONS:  Current Outpatient Prescriptions  Medication Sig Dispense Refill  . acetaminophen (TYLENOL) 325 MG tablet Take 2 tablets (650 mg total) by mouth every 6 (six) hours as needed for pain.  60 tablet  2  . famotidine (PEPCID) 20 MG tablet Take 1 tablet (20 mg total) by mouth 2 (two) times daily.  60 tablet  3  . guaiFENesin (MUCINEX) 600 MG 12 hr tablet Take 1,200 mg by mouth 2 (two) times daily as needed. For cough      .  lisinopril-hydrochlorothiazide (PRINZIDE,ZESTORETIC) 20-25 MG per tablet TAKE ONE TABLET BY MOUTH EVERY DAY  90 tablet  1  . ondansetron (ZOFRAN) 4 MG tablet Take 1 tablet (4 mg total) by mouth every 8 (eight) hours as needed for nausea or vomiting.  20 tablet  0  . polyethylene glycol powder (GLYCOLAX/MIRALAX) powder Take 17 g by mouth 2 (two) times daily as needed.  3350 g  1  . pravastatin (PRAVACHOL) 40  MG tablet TAKE ONE TABLET BY MOUTH EVERY DAY  90 tablet  3  . sodium chloride (OCEAN) 0.65 % nasal spray Place 4 sprays into the nose daily as needed. For congestion.      . tamoxifen (NOLVADEX) 10 MG tablet Take 10 mg by mouth daily.      . [DISCONTINUED] Prochlorperazine Maleate (COMPAZINE PO) Take by mouth.       No current facility-administered medications for this visit.    SURGICAL HISTORY:  Past Surgical History  Procedure Laterality Date  . Cesarean section  25 years ago  . Tubal ligation    . Mastectomy Right   . Modified mastectomy Right 05/20/2012    Procedure: MODIFIED MASTECTOMY;  Surgeon: Edward Jolly, MD;  Location: [facility];  Service: General;  Laterality: Right;  . Portacath placement Left 05/20/2012    Procedure: INSERTION PORT-A-CATH;  Surgeon: Edward Jolly, MD;  Location: [facility];  Service: General;  Laterality: Left;  . Breast surgery Right 05/20/12    Rt br mastectomy    REVIEW OF SYSTEMS:  General: fatigue (+), night sweats (-), fever (-), pain (-) Lymph: palpable nodes (-) HEENT: vision changes (-), mucositis (-), gum bleeding (-), epistaxis (-) Cardiovascular: chest pain (-), palpitations (-) Pulmonary: shortness of breath (-), dyspnea on exertion (-), cough (-), hemoptysis (-) GI:  Early satiety (-), melena (-), dysphagia (-), nausea/vomiting (+), diarrhea (-) GU: dysuria (-), hematuria (-), incontinence (-) Musculoskeletal: joint swelling (-), joint pain (-), back pain (-) Neuro: weakness (-), numbness (-), headache (-), confusion (-) Skin: Rash (-), lesions (-), dryness (-) Psych: depression (-), suicidal/homicidal ideation (-), feeling of hopelessness (-)   PHYSICAL EXAMINATION: Blood pressure 134/81, pulse 72, temperature 98.3 F (36.8 C), temperature source Oral, resp. rate 18, height '5\' 3"'  (1.6 m), weight 173 lb 3.2 oz (78.563 kg). Body mass index is 30.69 kg/(m^2). General: Patient is a well appearing female in no acute distress HEENT:  PERRLA, sclerae anicteric no conjunctival pallor, MMM Neck: supple, no palpable adenopathy Lungs: clear to auscultation bilaterally, no wheezes, rhonchi, or rales Cardiovascular: regular rate rhythm, S1, S2, no murmurs, rubs or gallops Abdomen: Soft, non-tender, non-distended, normoactive bowel sounds, no HSM Extremities: warm and well perfused, no clubbing, cyanosis, or edema Skin: No rashes or lesions Neuro: Non-focal Breasts: right mastecomy site well healed, no nodularity, left breast, no masses, nodules or skin changes noted ECOG PERFORMANCE STATUS: 1 - Symptomatic but completely ambulatory      LABORATORY DATA: Lab Results  Component Value Date   WBC 3.7* 04/09/2013   HGB 12.9 04/09/2013   HCT 37.3 04/09/2013   MCV 86.4 04/09/2013   PLT 246 04/09/2013      Chemistry      Component Value Date/Time   NA 137 04/07/2013 1501   NA 141 12/31/2012 0821   K 3.2* 04/07/2013 1501   K 3.3* 12/31/2012 0821   CL 101 04/07/2013 1501   CL 103 09/23/2012 1040   CO2 27 04/07/2013 1501   CO2 22 12/31/2012 6283  BUN 8 04/07/2013 1501   BUN 10.3 12/31/2012 0821   CREATININE 0.82 04/07/2013 1501   CREATININE 1.03 03/27/2013 2230   CREATININE 0.9 12/31/2012 0821      Component Value Date/Time   CALCIUM 9.6 04/07/2013 1501   CALCIUM 9.6 12/31/2012 0821   ALKPHOS 53 03/14/2013 0348   ALKPHOS 56 12/31/2012 0821   AST 18 03/14/2013 0348   AST 15 12/31/2012 0821   ALT 12 03/14/2013 0348   ALT 16 12/31/2012 0821   BILITOT 0.3 03/14/2013 0348   BILITOT 0.26 12/31/2012 0821    REASON FOR ADDENDUM, AMENDMENT OR CORRECTION: [id number]: Modification of the tumor staging. 05/28/12 08:14:17 AM (gt) ADDITIONAL INFORMATION: CHROMOGENIC IN-SITU HYBRIDIZATION (Block 1G) Interpretation HER-2/NEU BY CISH - NO AMPLIFICATION OF HER-2 DETECTED. THE RATIO OF HER-2: CEP 17 SIGNALS WAS 0.93. Reference range: Ratio: HER2:CEP17 < 1.8 - gene amplification not observed Ratio: HER2:CEP 17 1.8-2.2 - equivocal result Ratio:  HER2:CEP17 > 2.2 - gene amplification observed Claudette Laws MD Pathologist, Electronic Signature ( Signed 05/28/2012) CHROMOGENIC IN-SITU HYBRIDIZATION (Block 1D) Interpretation HER-2/NEU BY CISH - NO AMPLIFICATION OF HER-2 DETECTED. THE RATIO OF HER-2: CEP 17 SIGNALS WAS 1.15. Reference range: Ratio: HER2:CEP17 < 1.8 - gene amplification not observed Ratio: HER2:CEP 17 1.8-2.2 - equivocal result Ratio: HER2:CEP17 > 2.2 - gene amplification observed Claudette Laws MD 1 of 4 Amended copy Amended FINAL for THANG, Johnathan [phone]) ADDITIONAL INFORMATION:(continued) Pathologist, Electronic Signature ( Signed 05/28/2012) CHROMOGENIC IN-SITU HYBRIDIZATION (Block 1A) Interpretation HER-2/NEU BY CISH - NO AMPLIFICATION OF HER-2 DETECTED. THE RATIO OF HER-2: CEP 17 SIGNALS WAS 1.28. Reference range: Ratio: HER2:CEP17 < 1.8 - gene amplification not observed Ratio: HER2:CEP 17 1.8-2.2 - equivocal result Ratio: HER2:CEP17 > 2.2 - gene amplification observed Claudette Laws MD Pathologist, Electronic Signature ( Signed 05/28/2012) FINAL DIAGNOSIS Diagnosis Breast, modified radical mastectomy , Right - TUMOR #1 (COIL CLIP) - INVASIVE DUCTAL CARCINOMA, GRADE II (1.9 CM), SEE COMMENT. - INVASIVE CARCINOMA IS 4 CM FROM NEAREST MARGIN (DEEP). - DUCTAL CARCINOMA IN SITU, GRADE III WITH COMEDO NECROSIS AND CALCIFICATIONS. - TUMOR #2 (RIBBON CLIP) - INVASIVE DUCTAL CARCINOMA WITH EXTRAVASATED MUCIN (COLLOID CARCINOMA) (2.5 CM), SEE COMMENT. - INVASIVE TUMOR IS 3 CM FROM NEAREST MARGIN (DEEP). - TUMOR #3 (WING CLIP) - INVASIVE DUCTAL CARCINOMA WITH EXTRAVASATED MUCIN (COLLOID CARCINOMA) (1.8 CM). - INVASIVE TUMOR IS 1.8 CM FROM NEAREST MARGIN (DEEP). - ONE LYMPH NODE, POSITIVE FOR METASTATIC MAMMARY CARCINOMA (1/17). Microscopic Comment BREAST, INVASIVE TUMOR, WITH LYMPH NODE SAMPLING Specimen, including laterality: Right breast Procedure: Modified radial mastectomy Grade: #1 = Grade  II; #2 = Grade III; #3 = Grade III Tubule formation: 2;3;3 Nuclear pleomorphism: 3;3;3 Mitotic: 2;2;2 Tumor size (gross measurement): Tumor #1 = 1.9 cm; tumor #2 = 3.0 cm; tumor #3 = 1.8 cm 2 of 4 Amended copy Amended FINAL for Trimarco, Karole [phone]) Microscopic Comment(continued) Margins: Invasive, distance to closest margin: 4 cm; 3 cm; 1.8 cm In-situ, distance to closest margin: 4.0 cm; N/A; N/A If margin positive, focally or broadly: N/A Lymphovascular invasion: Present Ductal carcinoma in situ: Present Grade: III Extensive intraductal component: Absent Lobular neoplasia: Absent Tumor focality: Multifocal Treatment effect: None If present, treatment effect in breast tissue, lymph nodes or both: N/A Extent of tumor: Skin: Grossly negative Nipple: Grossly negative Skeletal muscle: N/A Lymph nodes: # examined: 17 Lymph nodes with metastasis: 1 Isolated tumor cells (< 0.2 mm): 0 Micrometastasis: (> 0.2 mm and < 2.0 mm): 0 Macrometastasis: (> 2.0 mm): At least 1.7 cm, see  comment Extracapsular extension: Absent see comment Breast prognostic profile: Estrogen receptor: Not repeated, previous studies demonstrated 100% and 100% of positivity ([id number]) Progesterone receptor: Not repeated, previous studies demonstrated 100% and 99% of positivity ([id number]) Her 2 neu: Repeated, previous study demonstrated no amplification (0.94 and 1.28) ([id number]) Ki-67: Not repeated, previous studies demonstrated 19% and 77% proliferation rate ([id number]) Non-neoplastic breast: Fibrocystic change, pseudoangiomatous hyperplasia, microcalcifications, and fibroadenomatoid nodules. TNM: mpT2, pN1a, pMX Comments: The largest lymph node (3 cm) is the one involved by metastatic mammary carcinoma (slide 1V). The lymph node was submitted in multiple sections for review. The metastatic tumor deposits range from 1.4 to 1.7 cm in these sections. Definitive evidence of extracapsular invasion is  not identified. (CRR:caf 05/21/12) Mali RUND DO Pathologist, Electronic Signature   RADIOGRAPHIC STUDIES:  No results found.  ASSESSMENT: 58 year old female with  #1 stage II a multifocal invasive ductal carcinoma of the right breast status post mastectomy with axillary lymph node dissection.  Her final pathology revealed multifocal disease with the largest tumor mass measuring 3.0 cm. One of 17 lymph nodes was positive for metastatic disease.  #2. Status post adjuvant chemotherapy consisting of Taxotere and Cytoxan given every 3 weeks starting on 06/24/2012 through 10/07/2012. She completed a total of 6 cycles of chemotherapy.   #3 s/p radiation therapy completed on 12/16/12  #4 receiving tamoxifen 20 mg daily, tolerating it well  PLAN:  #1patient proceed with tamoxifen 20 mg  #2 refer to gynecology and ophthamology  #3 I will see her back in 3 months   All questions were answered. The patient knows to call the clinic with any problems, questions or concerns. We can certainly see the patient much sooner if necessary.  I spent 20 minutes counseling the patient face to face. The total time spent in the appointment was 27mnutes.  KMarcy Panning MD Medical/Oncology [phone](beeper) [phone](Office)  04/09/2013, 12:48 PM

## 2013-04-13 ENCOUNTER — Encounter: Payer: Self-pay | Admitting: Oncology

## 2013-04-13 NOTE — Progress Notes (Signed)
Patient came in to see if discount(green card) can be done again. I advised her no more green card and would have to set up pmts for her balances under 5000.00. I did give her an application for medicaid and advised her to take back to them in person.

## 2013-04-20 ENCOUNTER — Telehealth (INDEPENDENT_AMBULATORY_CARE_PROVIDER_SITE_OTHER): Payer: Self-pay

## 2013-04-20 NOTE — Telephone Encounter (Signed)
Called Tasha Ortiz to discuss PAC Removal and go over pre-op checklist.  Patient states she does not have her medicaid set up as of today and would feel better if she waited until she has her medicaid insurance before scheduling PAC Removal.  Patient will call our office once she has rec'd her medicaid card.

## 2013-04-23 ENCOUNTER — Emergency Department (HOSPITAL_COMMUNITY)
Admission: EM | Admit: 2013-04-23 | Discharge: 2013-04-23 | Disposition: A | Discharge status: Home / Self Care | Payer: Self-pay | Attending: Emergency Medicine | Admitting: Emergency Medicine

## 2013-04-23 ENCOUNTER — Encounter (HOSPITAL_COMMUNITY): Payer: Self-pay | Admitting: Emergency Medicine

## 2013-04-23 ENCOUNTER — Emergency Department (HOSPITAL_COMMUNITY): Payer: Self-pay

## 2013-04-23 DIAGNOSIS — R1110 Vomiting, unspecified: Secondary | ICD-10-CM

## 2013-04-23 DIAGNOSIS — K529 Noninfective gastroenteritis and colitis, unspecified: Secondary | ICD-10-CM

## 2013-04-23 DIAGNOSIS — R1084 Generalized abdominal pain: Secondary | ICD-10-CM | POA: Insufficient documentation

## 2013-04-23 DIAGNOSIS — R197 Diarrhea, unspecified: Secondary | ICD-10-CM | POA: Insufficient documentation

## 2013-04-23 DIAGNOSIS — R109 Unspecified abdominal pain: Secondary | ICD-10-CM

## 2013-04-23 DIAGNOSIS — C50919 Malignant neoplasm of unspecified site of unspecified female breast: Secondary | ICD-10-CM | POA: Insufficient documentation

## 2013-04-23 DIAGNOSIS — I10 Essential (primary) hypertension: Secondary | ICD-10-CM | POA: Insufficient documentation

## 2013-04-23 DIAGNOSIS — Z79899 Other long term (current) drug therapy: Secondary | ICD-10-CM | POA: Insufficient documentation

## 2013-04-23 DIAGNOSIS — R112 Nausea with vomiting, unspecified: Secondary | ICD-10-CM | POA: Insufficient documentation

## 2013-04-23 DIAGNOSIS — E7800 Pure hypercholesterolemia, unspecified: Secondary | ICD-10-CM | POA: Insufficient documentation

## 2013-04-23 DIAGNOSIS — K219 Gastro-esophageal reflux disease without esophagitis: Secondary | ICD-10-CM | POA: Insufficient documentation

## 2013-04-23 LAB — COMPREHENSIVE METABOLIC PANEL
ALT: 36 U/L — ABNORMAL HIGH (ref 0–35)
AST: 32 U/L (ref 0–37)
Albumin: 4.2 g/dL (ref 3.5–5.2)
Alkaline Phosphatase: 45 U/L (ref 39–117)
BUN: 10 mg/dL (ref 6–23)
CO2: 26 mEq/L (ref 19–32)
Calcium: 9.6 mg/dL (ref 8.4–10.5)
Chloride: 98 mEq/L (ref 96–112)
Creatinine, Ser: 0.87 mg/dL (ref 0.50–1.10)
GFR calc Af Amer: 85 mL/min — ABNORMAL LOW (ref >90)
GFR calc non Af Amer: 74 mL/min — ABNORMAL LOW (ref >90)
Glucose, Bld: 119 mg/dL — ABNORMAL HIGH (ref 70–99)
Potassium: 3.5 mEq/L — ABNORMAL LOW (ref 3.7–5.3)
Sodium: 139 mEq/L (ref 137–147)
Total Bilirubin: 0.3 mg/dL (ref 0.3–1.2)
Total Protein: 8 g/dL (ref 6.0–8.3)

## 2013-04-23 LAB — CBC WITH DIFFERENTIAL/PLATELET
Basophils Absolute: 0 10*3/uL (ref 0.0–0.1)
Basophils Relative: 0 % (ref 0–1)
Eosinophils Absolute: 0.1 10*3/uL (ref 0.0–0.7)
Eosinophils Relative: 1 % (ref 0–5)
HCT: 40.9 % (ref 36.0–46.0)
Hemoglobin: 14.2 g/dL (ref 12.0–15.0)
Lymphocytes Relative: 23 % (ref 12–46)
Lymphs Abs: 1.2 10*3/uL (ref 0.7–4.0)
MCH: 29.3 pg (ref 26.0–34.0)
MCHC: 34.7 g/dL (ref 30.0–36.0)
MCV: 84.5 fL (ref 78.0–100.0)
Monocytes Absolute: 0.3 10*3/uL (ref 0.1–1.0)
Monocytes Relative: 5 % (ref 3–12)
Neutro Abs: 3.8 10*3/uL (ref 1.7–7.7)
Neutrophils Relative %: 71 % (ref 43–77)
Platelets: 237 10*3/uL (ref 150–400)
RBC: 4.84 MIL/uL (ref 3.87–5.11)
RDW: 12.5 % (ref 11.5–15.5)
WBC: 5.4 10*3/uL (ref 4.0–10.5)

## 2013-04-23 LAB — URINALYSIS, ROUTINE W REFLEX MICROSCOPIC
Bilirubin Urine: NEGATIVE
Glucose, UA: NEGATIVE mg/dL
Hgb urine dipstick: NEGATIVE
Ketones, ur: NEGATIVE mg/dL
Nitrite: NEGATIVE
Protein, ur: NEGATIVE mg/dL
Specific Gravity, Urine: 1.018 (ref 1.005–1.030)
Urobilinogen, UA: 1 mg/dL (ref 0.0–1.0)
pH: 6 (ref 5.0–8.0)

## 2013-04-23 LAB — LIPASE, BLOOD: Lipase: 39 U/L (ref 11–59)

## 2013-04-23 LAB — URINE MICROSCOPIC-ADD ON

## 2013-04-23 MED ORDER — PIPERACILLIN-TAZOBACTAM 3.375 G IVPB 30 MIN
3.3750 g | Freq: Once | INTRAVENOUS | Status: AC
  Administered 2013-04-23: 3.375 g via INTRAVENOUS
  Filled 2013-04-23: qty 50

## 2013-04-23 MED ORDER — SODIUM CHLORIDE 0.9 % IV BOLUS (SEPSIS)
1000.0000 mL | Freq: Once | INTRAVENOUS | Status: AC
  Administered 2013-04-23: 1000 mL via INTRAVENOUS

## 2013-04-23 MED ORDER — ONDANSETRON HCL 4 MG/2ML IJ SOLN
4.0000 mg | Freq: Once | INTRAMUSCULAR | Status: AC
  Administered 2013-04-23: 4 mg via INTRAVENOUS
  Filled 2013-04-23: qty 2

## 2013-04-23 MED ORDER — AMOXICILLIN-POT CLAVULANATE 875-125 MG PO TABS: 1.0000 | ORAL_TABLET | Freq: Two times a day (BID) | ORAL | Status: DC

## 2013-04-23 MED ORDER — HEPARIN SOD (PORK) LOCK FLUSH 100 UNIT/ML IV SOLN
500.0000 [IU] | Freq: Once | INTRAVENOUS | Status: AC
  Administered 2013-04-23: 500 [IU]
  Filled 2013-04-23: qty 5

## 2013-04-23 MED ORDER — ONDANSETRON 8 MG PO TBDP
8.0000 mg | ORAL_TABLET | Freq: Once | ORAL | Status: AC
  Administered 2013-04-23: 8 mg via ORAL
  Filled 2013-04-23: qty 1

## 2013-04-23 MED ORDER — ONDANSETRON HCL 4 MG PO TABS: 4.0000 mg | ORAL_TABLET | Freq: Three times a day (TID) | ORAL | Status: DC | PRN

## 2013-04-23 MED ORDER — AMOXICILLIN-POT CLAVULANATE 400-57 MG/5ML PO SUSR: 800.0000 mg | Freq: Two times a day (BID) | ORAL | Status: DC

## 2013-04-23 MED ORDER — KETOROLAC TROMETHAMINE 30 MG/ML IJ SOLN
30.0000 mg | Freq: Once | INTRAMUSCULAR | Status: AC
  Administered 2013-04-23: 30 mg via INTRAVENOUS
  Filled 2013-04-23: qty 1

## 2013-04-23 NOTE — ED Notes (Signed)
Attempted IV access time 1 attempt, unsuccessful.   IV team paged for access.

## 2013-04-23 NOTE — Discharge Instructions (Signed)
Stay hydrated.   Take zofran for nausea.   Take augmentin for 10 days for possible enteritis.   Follow up with a GI doctor for possible colonoscopy.   Return to ER if you have severe pain, vomiting, fever.

## 2013-04-23 NOTE — ED Notes (Signed)
Pt reports 4/10 generalized lower abdominal pain. Pt reports tenderness and cramping. Pt reports nausea and emesis, however denies diarrhea. Pt reports being seen in the past at [facility] for similar symptoms on 03/14/13. Pt reports that she was told last time that she had some swelling/inflmattion around her intestines and colon and was prescribed antibiotics.

## 2013-04-23 NOTE — ED Provider Notes (Signed)
CSN: [id number]     Arrival date & time 04/23/13  1428 History   First MD Initiated Contact with Patient 04/23/13 1628     Chief Complaint  Patient presents with  . Abdominal Pain   (Consider location/radiation/quality/duration/timing/severity/associated sxs/prior Treatment) The history is provided by the patient.  Tasha Ortiz is a 58 y.o. female hx of HTN, HL, heart burn here with abdominal pain, diarrhea, vomiting. She has generalized abdominal pain for last 3 days. Diffuse cramps as well. He was 4/10 and no radiation. She had several episodes of vomiting and diarrhea. She was seen a month ago and was diagnosed with enteritis based on CT and finished a course of Augmentin. She has no history of IBD but didn't see gastroenterologist yet.    Past Medical History  Diagnosis Date  . Hypertension   . Hypercholesteremia     taken off chol meds Dec 2012  . Acid reflux   . Breast cancer   . Heartburn    Past Surgical History  Procedure Laterality Date  . Cesarean section  25 years ago  . Tubal ligation    . Mastectomy Right   . Modified mastectomy Right 05/20/2012    Procedure: MODIFIED MASTECTOMY;  Surgeon: Edward Jolly, MD;  Location: [facility];  Service: General;  Laterality: Right;  . Portacath placement Left 05/20/2012    Procedure: INSERTION PORT-A-CATH;  Surgeon: Edward Jolly, MD;  Location: [facility];  Service: General;  Laterality: Left;  . Breast surgery Right 05/20/12    Rt br mastectomy   Family History  Problem Relation Age of Onset  . Hypertension Mother   . Diabetes Daughter   . Cancer Sister     brain  . Cancer Cousin     breast   History  Substance Use Topics  . Smoking status: Never Smoker   . Smokeless tobacco: Former Systems developer    Types: [place] date: 11/18/1996  . Alcohol Use: No   OB History   Grav Para Term Preterm Abortions TAB SAB Ect Mult Living   2 2 2       2      Review of Systems  Gastrointestinal: Positive for nausea, vomiting,  abdominal pain and diarrhea.  All other systems reviewed and are negative.    Allergies  Review of patient's allergies indicates no known allergies.  Home Medications   Current Outpatient Rx  Name  Route  Sig  Dispense  Refill  . acetaminophen (TYLENOL) 325 MG tablet   Oral   Take 2 tablets (650 mg total) by mouth every 6 (six) hours as needed for pain.   60 tablet   2   . famotidine (PEPCID) 20 MG tablet   Oral   Take 1 tablet (20 mg total) by mouth 2 (two) times daily.   60 tablet   3   . lisinopril-hydrochlorothiazide (PRINZIDE,ZESTORETIC) 20-25 MG per tablet   Oral   Take 1 tablet by mouth daily.         . polyethylene glycol powder (GLYCOLAX/MIRALAX) powder   Oral   Take 17 g by mouth 2 (two) times daily as needed.   3350 g   1   . pravastatin (PRAVACHOL) 40 MG tablet   Oral   Take 40 mg by mouth daily.         . sodium chloride (OCEAN) 0.65 % nasal spray   Nasal   Place 4 sprays into the nose daily as needed. For congestion.         Marland Kitchen  tamoxifen (NOLVADEX) 20 MG tablet   Oral   Take 20 mg by mouth daily.          BP 131/88  Pulse 72  Temp(Src) 97.8 F (36.6 C) (Oral)  Resp 20  SpO2 100% Physical Exam  Nursing note and vitals reviewed. Constitutional: She is oriented to person, place, and time. She appears well-developed and well-nourished.  HENT:  Head: Normocephalic.  Mouth/Throat: Oropharynx is clear and moist.  Eyes: Conjunctivae are normal. Pupils are equal, round, and reactive to light.  Neck: Normal range of motion. Neck supple.  Cardiovascular: Normal rate, regular rhythm and normal heart sounds.   Pulmonary/Chest: Effort normal and breath sounds normal. No respiratory distress. She has no wheezes. She has no rales.  Abdominal: Bowel sounds are normal.  Mild diffuse tenderness, no rebound   Musculoskeletal: Normal range of motion.  Neurological: She is alert and oriented to person, place, and time.  Skin: Skin is warm and dry.   Psychiatric: She has a normal mood and affect. Her behavior is normal. Judgment and thought content normal.    ED Course  Procedures (including critical care time) Labs Review Labs Reviewed  COMPREHENSIVE METABOLIC PANEL - Abnormal; Notable for the following:    Potassium 3.5 (*)    Glucose, Bld 119 (*)    ALT 36 (*)    GFR calc non Af Amer 74 (*)    GFR calc Af Amer 85 (*)    All other components within normal limits  URINALYSIS, ROUTINE W REFLEX MICROSCOPIC - Abnormal; Notable for the following:    APPearance CLOUDY (*)    Leukocytes, UA TRACE (*)    All other components within normal limits  URINE MICROSCOPIC-ADD ON - Abnormal; Notable for the following:    Squamous Epithelial / LPF FEW (*)    All other components within normal limits  CBC WITH DIFFERENTIAL  LIPASE, BLOOD   Imaging Review Dg Abd 2 Views  04/23/2013   CLINICAL DATA:  Abdomen pain, nausea, vomiting  EXAM: ABDOMEN - 2 VIEW  COMPARISON:  None.  FINDINGS: The bowel gas pattern is normal. There are few nondilated air-filled small bowel loop loops and left abdomen. There is no evidence of free air. No radio-opaque calculi or other significant radiographic abnormality is seen.  IMPRESSION: No bowel obstruction or free air.   Electronically Signed   By: Abelardo Diesel M.D.   On: 04/23/2013 17:04    EKG Interpretation   None       MDM  No diagnosis found. Tasha Ortiz is a 58 y.o. female here with abdominal pain, n/v/diarrhea. Likely gastro. Since she has two episodes in a month, consider IBD. She has previous C section so will get xray to r/o SBO. Since her CBC is normal and her abdominal exam is minimally tender, I don't think she needs another CT if xray is normal. Will treat with augmentin empirically for enteritis again. She will need to see GI to get colonoscopy to r/o IBD.   7:05 PM Xray showed no SBO. Patient's abdomen soft, nontender now. Given zosyn will d/c home on augmentin and zofran. Will refer  to GI again.     Wandra Arthurs, MD 04/23/13 Drema Halon

## 2013-04-23 NOTE — ED Notes (Signed)
MD at bedside. 

## 2013-04-27 ENCOUNTER — Encounter (HOSPITAL_COMMUNITY): Payer: Self-pay | Admitting: Emergency Medicine

## 2013-04-27 ENCOUNTER — Emergency Department (HOSPITAL_COMMUNITY): Payer: Self-pay

## 2013-04-27 ENCOUNTER — Ambulatory Visit: Payer: Medicaid Other | Admitting: Neurology

## 2013-04-27 ENCOUNTER — Ambulatory Visit (INDEPENDENT_AMBULATORY_CARE_PROVIDER_SITE_OTHER): Payer: Self-pay | Admitting: Family Medicine

## 2013-04-27 ENCOUNTER — Emergency Department (HOSPITAL_COMMUNITY)
Admission: EM | Admit: 2013-04-27 | Discharge: 2013-04-27 | Disposition: A | Discharge status: Home / Self Care | Payer: Self-pay | Attending: Emergency Medicine | Admitting: Emergency Medicine

## 2013-04-27 ENCOUNTER — Encounter: Payer: Self-pay | Admitting: Family Medicine

## 2013-04-27 VITALS — BP 137/72 | HR 76 | Temp 99.7°F | Ht 66.0 in | Wt 172.0 lb

## 2013-04-27 DIAGNOSIS — M542 Cervicalgia: Secondary | ICD-10-CM | POA: Insufficient documentation

## 2013-04-27 DIAGNOSIS — Z853 Personal history of malignant neoplasm of breast: Secondary | ICD-10-CM | POA: Insufficient documentation

## 2013-04-27 DIAGNOSIS — R631 Polydipsia: Secondary | ICD-10-CM | POA: Insufficient documentation

## 2013-04-27 DIAGNOSIS — Y939 Activity, unspecified: Secondary | ICD-10-CM | POA: Insufficient documentation

## 2013-04-27 DIAGNOSIS — R7309 Other abnormal glucose: Secondary | ICD-10-CM | POA: Insufficient documentation

## 2013-04-27 DIAGNOSIS — K219 Gastro-esophageal reflux disease without esophagitis: Secondary | ICD-10-CM | POA: Insufficient documentation

## 2013-04-27 DIAGNOSIS — R739 Hyperglycemia, unspecified: Secondary | ICD-10-CM

## 2013-04-27 DIAGNOSIS — R112 Nausea with vomiting, unspecified: Secondary | ICD-10-CM | POA: Insufficient documentation

## 2013-04-27 DIAGNOSIS — IMO0002 Reserved for concepts with insufficient information to code with codable children: Secondary | ICD-10-CM | POA: Insufficient documentation

## 2013-04-27 DIAGNOSIS — N390 Urinary tract infection, site not specified: Secondary | ICD-10-CM | POA: Insufficient documentation

## 2013-04-27 DIAGNOSIS — X58XXXA Exposure to other specified factors, initial encounter: Secondary | ICD-10-CM | POA: Insufficient documentation

## 2013-04-27 DIAGNOSIS — Z792 Long term (current) use of antibiotics: Secondary | ICD-10-CM | POA: Insufficient documentation

## 2013-04-27 DIAGNOSIS — R5381 Other malaise: Secondary | ICD-10-CM

## 2013-04-27 DIAGNOSIS — E7800 Pure hypercholesterolemia, unspecified: Secondary | ICD-10-CM | POA: Insufficient documentation

## 2013-04-27 DIAGNOSIS — R5383 Other fatigue: Secondary | ICD-10-CM

## 2013-04-27 DIAGNOSIS — R3589 Other polyuria: Secondary | ICD-10-CM | POA: Insufficient documentation

## 2013-04-27 DIAGNOSIS — Z79899 Other long term (current) drug therapy: Secondary | ICD-10-CM | POA: Insufficient documentation

## 2013-04-27 DIAGNOSIS — R55 Syncope and collapse: Secondary | ICD-10-CM | POA: Insufficient documentation

## 2013-04-27 DIAGNOSIS — I10 Essential (primary) hypertension: Secondary | ICD-10-CM | POA: Insufficient documentation

## 2013-04-27 DIAGNOSIS — K5900 Constipation, unspecified: Secondary | ICD-10-CM

## 2013-04-27 DIAGNOSIS — R358 Other polyuria: Secondary | ICD-10-CM | POA: Insufficient documentation

## 2013-04-27 DIAGNOSIS — K5289 Other specified noninfective gastroenteritis and colitis: Secondary | ICD-10-CM | POA: Insufficient documentation

## 2013-04-27 DIAGNOSIS — R531 Weakness: Secondary | ICD-10-CM

## 2013-04-27 DIAGNOSIS — Y929 Unspecified place or not applicable: Secondary | ICD-10-CM | POA: Insufficient documentation

## 2013-04-27 DIAGNOSIS — R197 Diarrhea, unspecified: Secondary | ICD-10-CM

## 2013-04-27 LAB — URINALYSIS, ROUTINE W REFLEX MICROSCOPIC
Bilirubin Urine: NEGATIVE
Glucose, UA: NEGATIVE mg/dL
Hgb urine dipstick: NEGATIVE
Ketones, ur: NEGATIVE mg/dL
Nitrite: NEGATIVE
Protein, ur: NEGATIVE mg/dL
Specific Gravity, Urine: 1.019 (ref 1.005–1.030)
Urobilinogen, UA: 1 mg/dL (ref 0.0–1.0)
pH: 6.5 (ref 5.0–8.0)

## 2013-04-27 LAB — CBC WITH DIFFERENTIAL/PLATELET
Basophils Absolute: 0 10*3/uL (ref 0.0–0.1)
Basophils Relative: 0 % (ref 0–1)
Eosinophils Absolute: 0.1 10*3/uL (ref 0.0–0.7)
Eosinophils Relative: 1 % (ref 0–5)
HCT: 36.8 % (ref 36.0–46.0)
Hemoglobin: 13.3 g/dL (ref 12.0–15.0)
Lymphocytes Relative: 12 % (ref 12–46)
Lymphs Abs: 1.1 10*3/uL (ref 0.7–4.0)
MCH: 30.4 pg (ref 26.0–34.0)
MCHC: 36.1 g/dL — ABNORMAL HIGH (ref 30.0–36.0)
MCV: 84 fL (ref 78.0–100.0)
Monocytes Absolute: 0.6 10*3/uL (ref 0.1–1.0)
Monocytes Relative: 6 % (ref 3–12)
Neutro Abs: 7.5 10*3/uL (ref 1.7–7.7)
Neutrophils Relative %: 81 % — ABNORMAL HIGH (ref 43–77)
Platelets: 219 10*3/uL (ref 150–400)
RBC: 4.38 MIL/uL (ref 3.87–5.11)
RDW: 12.7 % (ref 11.5–15.5)
WBC: 9.3 10*3/uL (ref 4.0–10.5)

## 2013-04-27 LAB — URINE MICROSCOPIC-ADD ON

## 2013-04-27 LAB — POCT I-STAT, CHEM 8
BUN: 10 mg/dL (ref 6–23)
Calcium, Ion: 1.24 mmol/L — ABNORMAL HIGH (ref 1.12–1.23)
Chloride: 99 mEq/L (ref 96–112)
Creatinine, Ser: 1 mg/dL (ref 0.50–1.10)
Glucose, Bld: 155 mg/dL — ABNORMAL HIGH (ref 70–99)
HCT: 39 % (ref 36.0–46.0)
Hemoglobin: 13.3 g/dL (ref 12.0–15.0)
Potassium: 3 mEq/L — ABNORMAL LOW (ref 3.7–5.3)
Sodium: 139 mEq/L (ref 137–147)
TCO2: 24 mmol/L (ref 0–100)

## 2013-04-27 LAB — HEMOGLOBIN A1C
Hgb A1c MFr Bld: 6.2 % — ABNORMAL HIGH (ref <5.7)
Mean Plasma Glucose: 131 mg/dL — ABNORMAL HIGH (ref <117)

## 2013-04-27 MED ORDER — METFORMIN HCL 500 MG PO TABS: 500.0000 mg | ORAL_TABLET | Freq: Two times a day (BID) | ORAL | Status: DC

## 2013-04-27 MED ORDER — SODIUM CHLORIDE 0.9 % IV BOLUS (SEPSIS)
1000.0000 mL | Freq: Once | INTRAVENOUS | Status: AC
  Administered 2013-04-27: 1000 mL via INTRAVENOUS

## 2013-04-27 MED ORDER — FLUCONAZOLE 100 MG PO TABS
150.0000 mg | ORAL_TABLET | Freq: Once | ORAL | Status: AC
  Administered 2013-04-27: 150 mg via ORAL
  Filled 2013-04-27: qty 2

## 2013-04-27 MED ORDER — POTASSIUM CHLORIDE CRYS ER 20 MEQ PO TBCR
40.0000 meq | EXTENDED_RELEASE_TABLET | Freq: Once | ORAL | Status: AC
  Administered 2013-04-27: 40 meq via ORAL
  Filled 2013-04-27: qty 2

## 2013-04-27 MED ORDER — DEXTROSE 5 % IV SOLN
1.0000 g | Freq: Once | INTRAVENOUS | Status: AC
  Administered 2013-04-27: 1 g via INTRAVENOUS
  Filled 2013-04-27: qty 10

## 2013-04-27 MED ORDER — METFORMIN HCL 500 MG PO TABS
500.0000 mg | ORAL_TABLET | Freq: Once | ORAL | Status: AC
  Administered 2013-04-27: 500 mg via ORAL
  Filled 2013-04-27: qty 1

## 2013-04-27 MED ORDER — NITROFURANTOIN MONOHYD MACRO 100 MG PO CAPS: 100.0000 mg | ORAL_CAPSULE | Freq: Two times a day (BID) | ORAL | Status: DC

## 2013-04-27 NOTE — ED Provider Notes (Signed)
CSN: [id number]     Arrival date & time 04/27/13  0123 History   First MD Initiated Contact with Patient 04/27/13 0201     Chief Complaint  Patient presents with  . Syncope    (Consider location/radiation/quality/duration/timing/severity/associated sxs/prior Treatment) HPI [name] who has been having gastrointestinal difficulties since before Thanksgiving of last year. She was diagnosed with enteritis in December of last year and placed on a course of Augmentin. 5 days ago she developed nausea and vomiting and abdominal cramping. She was seen here 3 days ago and diagnosed with enteritis and placed on another course of Augmentin along with Zofran for nausea and vomiting. She did not have diarrhea with this. She had been doing better yesterday evening when she developed intermittent abdominal cramping. The symptoms were moderate. This morning, just prior to arrival, she felt the need to move her bowels. She passed a hard stool after which she had a syncopal episode. Following the syncopal episode she had diarrhea. Her abdominal cramping has improved subsequently. She has not had any vomiting associated with this. When EMS evaluated the patient she felt "woozy". She is complaining of neck pain and so was placed in a cervical collar prior to transport. She also has an abrasion to her nose.  EMS reports a CBG of 270 prior to arrival. She has no history of diabetes but does report polyuria and polydipsia.  Past Medical History  Diagnosis Date  . Hypertension   . Hypercholesteremia     taken off chol meds Dec 2012  . Acid reflux   . Breast cancer   . Heartburn   . Hypertension    Past Surgical History  Procedure Laterality Date  . Cesarean section  25 years ago  . Tubal ligation    . Mastectomy Right   . Modified mastectomy Right 05/20/2012    Procedure: MODIFIED MASTECTOMY;  Surgeon: Edward Jolly, MD;  Location: [facility];  Service: General;  Laterality: Right;  . Portacath  placement Left 05/20/2012    Procedure: INSERTION PORT-A-CATH;  Surgeon: Edward Jolly, MD;  Location: [facility];  Service: General;  Laterality: Left;  . Breast surgery Right 05/20/12    Rt br mastectomy   Family History  Problem Relation Age of Onset  . Hypertension Mother   . Diabetes Daughter   . Cancer Sister     brain  . Cancer Cousin     breast   History  Substance Use Topics  . Smoking status: Never Smoker   . Smokeless tobacco: Former Systems developer    Types: [place] date: 11/18/1996  . Alcohol Use: No   OB History   Grav Para Term Preterm Abortions TAB SAB Ect Mult Living   2 2 2       2      Review of Systems  All other systems reviewed and are negative.    Allergies  Review of patient's allergies indicates no known allergies.  Home Medications   Current Outpatient Rx  Name  Route  Sig  Dispense  Refill  . acetaminophen (TYLENOL) 325 MG tablet   Oral   Take 2 tablets (650 mg total) by mouth every 6 (six) hours as needed for pain.   60 tablet   2   . amoxicillin-clavulanate (AUGMENTIN) 400-57 MG/5ML suspension   Oral   Take 10 mLs (800 mg total) by mouth 2 (two) times daily.   200 mL   0   . famotidine (PEPCID)  20 MG tablet   Oral   Take 1 tablet (20 mg total) by mouth 2 (two) times daily.   60 tablet   3   . lisinopril-hydrochlorothiazide (PRINZIDE,ZESTORETIC) 20-25 MG per tablet   Oral   Take 1 tablet by mouth daily.         . ondansetron (ZOFRAN) 4 MG tablet   Oral   Take 1 tablet (4 mg total) by mouth every 8 (eight) hours as needed for nausea or vomiting.   10 tablet   0   . polyethylene glycol powder (GLYCOLAX/MIRALAX) powder   Oral   Take 17 g by mouth 2 (two) times daily as needed.   3350 g   1   . pravastatin (PRAVACHOL) 40 MG tablet   Oral   Take 40 mg by mouth daily.         . sodium chloride (OCEAN) 0.65 % nasal spray   Nasal   Place 4 sprays into the nose daily as needed. For congestion.         . tamoxifen  (NOLVADEX) 20 MG tablet   Oral   Take 20 mg by mouth daily.          BP 112/63  Pulse 71  Temp(Src) 99 F (37.2 C) (Oral)  Resp 22  Ht 5\' 6"  (1.676 m)  Wt 173 lb (78.472 kg)  BMI 27.94 kg/m2  SpO2 99%  Physical Exam General: Well-developed, well-nourished female in no acute distress; appearance consistent with age of record HENT: normocephalic; superficial abrasion right side of nose Eyes: pupils equal, round and reactive to light; extraocular muscles intact Neck: Immobilized in cervical collar; trachea midline; poorly localized tenderness Heart: regular rate and rhythm Lungs: clear to auscultation bilaterally Abdomen: soft; nondistended; mild diffuse tenderness; no masses or hepatosplenomegaly; bowel sounds present Extremities: No deformity; full range of motion; pulses normal Neurologic: Awake, alert and oriented; motor function intact in all extremities and symmetric; no facial droop Skin: Warm and dry Psychiatric: Normal mood and affect    ED Course  Procedures (including critical care time)    MDM  Nursing notes and vitals signs, including pulse oximetry, reviewed.  Summary of this visit's results, reviewed by myself:  Labs:  Results for orders placed during the hospital encounter of 04/27/13 (from the past 24 hour(s))  URINALYSIS, ROUTINE W REFLEX MICROSCOPIC     Status: Abnormal   Collection Time    04/27/13  1:55 AM      Result Value Range   Color, Urine YELLOW  YELLOW   APPearance CLOUDY (*) CLEAR   Specific Gravity, Urine 1.019  1.005 - 1.030   pH 6.5  5.0 - 8.0   Glucose, UA NEGATIVE  NEGATIVE mg/dL   Hgb urine dipstick NEGATIVE  NEGATIVE   Bilirubin Urine NEGATIVE  NEGATIVE   Ketones, ur NEGATIVE  NEGATIVE mg/dL   Protein, ur NEGATIVE  NEGATIVE mg/dL   Urobilinogen, UA 1.0  0.0 - 1.0 mg/dL   Nitrite NEGATIVE  NEGATIVE   Leukocytes, UA SMALL (*) NEGATIVE  URINE MICROSCOPIC-ADD ON     Status: Abnormal   Collection Time    04/27/13  1:55 AM       Result Value Range   Squamous Epithelial / LPF MANY (*) RARE   WBC, UA 7-10  <3 WBC/hpf   RBC / HPF 0-2  <3 RBC/hpf   Bacteria, UA RARE  RARE   Urine-Other FEW YEAST    CBC WITH DIFFERENTIAL     Status:  Abnormal   Collection Time    04/27/13  2:10 AM      Result Value Range   WBC 9.3  4.0 - 10.5 K/uL   RBC 4.38  3.87 - 5.11 MIL/uL   Hemoglobin 13.3  12.0 - 15.0 g/dL   HCT 36.8  36.0 - 46.0 %   MCV 84.0  78.0 - 100.0 fL   MCH 30.4  26.0 - 34.0 pg   MCHC 36.1 (*) 30.0 - 36.0 g/dL   RDW 12.7  11.5 - 15.5 %   Platelets 219  150 - 400 K/uL   Neutrophils Relative % 81 (*) 43 - 77 %   Lymphocytes Relative 12  12 - 46 %   Monocytes Relative 6  3 - 12 %   Eosinophils Relative 1  0 - 5 %   Basophils Relative 0  0 - 1 %   Neutro Abs 7.5  1.7 - 7.7 K/uL   Lymphs Abs 1.1  0.7 - 4.0 K/uL   Monocytes Absolute 0.6  0.1 - 1.0 K/uL   Eosinophils Absolute 0.1  0.0 - 0.7 K/uL   Basophils Absolute 0.0  0.0 - 0.1 K/uL  POCT I-STAT, CHEM 8     Status: Abnormal   Collection Time    04/27/13  2:21 AM      Result Value Range   Sodium 139  137 - 147 mEq/L   Potassium 3.0 (*) 3.7 - 5.3 mEq/L   Chloride 99  96 - 112 mEq/L   BUN 10  6 - 23 mg/dL   Creatinine, Ser 1.00  0.50 - 1.10 mg/dL   Glucose, Bld 155 (*) 70 - 99 mg/dL   Calcium, Ion 1.24 (*) 1.12 - 1.23 mmol/L   TCO2 24  0 - 100 mmol/L   Hemoglobin 13.3  12.0 - 15.0 g/dL   HCT 39.0  36.0 - 46.0 %    Imaging Studies: Dg Cervical Spine Complete  04/27/2013   CLINICAL DATA:  Fall, posterior neck pain  EXAM: CERVICAL SPINE  4+ VIEWS  COMPARISON:  None.  FINDINGS: C5-6 partial vertebral body fusion. C4-5 degenerative changes with anterior osteophyte formation. Lung apices predominantly clear. There is a partially imaged central catheter. Suboptimal open-mouth projections. C1-2 articulation and dens are grossly maintained. Suboptimal swimmer's view due to limited positioning, limited visualization of the cervicothoracic junction. Prevertebral soft  tissues within normal range.  IMPRESSION: No definite fracture or dislocation. The radiographs are suboptimal due to limited positioning. If there is concern for injury at the craniocervical or cervicothoracic junctions, I would recommend CT.   Electronically Signed   By: Carlos Levering M.D.   On: 04/27/2013 03:07   Ct Cervical Spine Wo Contrast  04/27/2013   CLINICAL DATA:  Right posterior neck pain  EXAM: CT CERVICAL SPINE WITHOUT CONTRAST  TECHNIQUE: Multidetector CT imaging of the cervical spine was performed without intravenous contrast. Multiplanar CT image reconstructions were also generated.  COMPARISON:  04/27/2013 radiograph  FINDINGS: Maintained craniocervical relationship. No dens fracture. C5-6 fusion at the intervertebral space and along the facet joints. Vertebral body height and alignment maintained. No acute fracture or dislocation. T2-3 degenerative disc disease. Mild lung apical scarring. Visualized intracranial contents within normal limits. Partially imaged left-sided central venous catheter.  IMPRESSION: C5-6 fusion.  No acute osseous finding of the cervical spine.   Electronically Signed   By: Carlos Levering M.D.   On: 04/27/2013 05:22   6:44 AM Patient's abdomen is soft and nontender. She was  given 1 g of Rocephin for her urinary tract infection. As Augmentin may cause diarrhea we will discontinue it as it may have exacerbated her symptoms. We will switch her to [facility] for her urinary tract infection. It appears the patient is a new-onset diabetic and we will start her on metformin 500 mg twice daily. This plan was discussed with Dr. Wendi Snipes of family practice and he agreed. He will arrange for the patient to be seen in the clinic later this morning.     Wynetta Fines, MD 04/27/13 [phone]

## 2013-04-27 NOTE — Assessment & Plan Note (Signed)
Feel this is a combination of decreased intake, increased output secondary to diarrhea, electrolyte abnormalities.  BMET today shows hypokalemia, repleted with K+ x 2.  F/U in 1-2 months.

## 2013-04-27 NOTE — Assessment & Plan Note (Signed)
Pt w/ episode of syncope s/p defecation this AM.  Had w/u in the ED which hypothesized that this was most likely vaso-vagal.  Will continue to monitor, and may need cardiac evaluation if continues.  She denies any CP, palpitations, or SOB at this time.

## 2013-04-27 NOTE — ED Notes (Signed)
MD at bedside. (ED Physician) 

## 2013-04-27 NOTE — Assessment & Plan Note (Signed)
Having diarrhea, hold miralax at this time.

## 2013-04-27 NOTE — ED Notes (Signed)
Patient transported to CT 

## 2013-04-27 NOTE — Progress Notes (Signed)
Tasha Ortiz is a 58 y.o. female who presents today for hospital f/u for syncopal episode.  As well, pt is concerned because she has been having recurrent intractable nausea, vomiting, and diarrhea for about two months ago.  No changes in diet at that time, only started Tamoxifen at that time secondary to breast cancer.  Has not been getting chemo or radiation since Oct 2014.  Pt having intermittent diarrhea 1-2 x per week since that time, non bloody/non bilious and was Rx dose of Augmentin in December.  As well, was seen in ED again about 4 days ago when she had another bout of Augmentin for similar Sx.  Today, she was using the bathroom with forcefull defecation, when she went to stand up and had a syncopal episode.  She denies any CP, SOB, palpitations, dizziness, or blurred vision prior to the episode or after.    Past Medical History  Diagnosis Date  . Hypertension   . Hypercholesteremia     taken off chol meds Dec 2012  . Acid reflux   . Breast cancer   . Heartburn   . Hypertension     History  Smoking status  . Never Smoker   Smokeless tobacco  . Former Systems developer  . Types: Chew  . Quit date: 11/18/1996    Family History  Problem Relation Age of Onset  . Hypertension Mother   . Diabetes Daughter   . Cancer Sister     brain  . Cancer Cousin     breast    Current Outpatient Prescriptions on File Prior to Visit  Medication Sig Dispense Refill  . acetaminophen (TYLENOL) 325 MG tablet Take 2 tablets (650 mg total) by mouth every 6 (six) hours as needed for pain.  60 tablet  2  . famotidine (PEPCID) 20 MG tablet Take 1 tablet (20 mg total) by mouth 2 (two) times daily.  60 tablet  3  . lisinopril-hydrochlorothiazide (PRINZIDE,ZESTORETIC) 20-25 MG per tablet Take 1 tablet by mouth daily.      . metFORMIN (GLUCOPHAGE) 500 MG tablet Take 1 tablet (500 mg total) by mouth 2 (two) times daily with a meal.  60 tablet  0  . nitrofurantoin, macrocrystal-monohydrate, (MACROBID) 100 MG  capsule Take 1 capsule (100 mg total) by mouth 2 (two) times daily. X 7 days  14 capsule  0  . ondansetron (ZOFRAN) 4 MG tablet Take 1 tablet (4 mg total) by mouth every 8 (eight) hours as needed for nausea or vomiting.  10 tablet  0  . polyethylene glycol powder (GLYCOLAX/MIRALAX) powder Take 17 g by mouth 2 (two) times daily as needed.  3350 g  1  . pravastatin (PRAVACHOL) 40 MG tablet Take 40 mg by mouth daily.      . sodium chloride (OCEAN) 0.65 % nasal spray Place 4 sprays into the nose daily as needed. For congestion.      . tamoxifen (NOLVADEX) 20 MG tablet Take 20 mg by mouth daily.      . [DISCONTINUED] Prochlorperazine Maleate (COMPAZINE PO) Take by mouth.       No current facility-administered medications on file prior to visit.    ROS: Per HPI.  All other systems reviewed and are negative.   Physical Exam Filed Vitals:   04/27/13 1421  BP: 137/72  Pulse: 76  Temp: 99.7 F (37.6 C)    Physical Examination: General appearance - alert, well appearing, and in no distress Mental status - alert, oriented to person, place, and  time Mouth - mucous membranes moist, pharynx normal without lesions Neck - No JVD, no carotid Bruits  Chest - clear to auscultation, no wheezes, rales or rhonchi, symmetric air entry Heart - normal rate and regular rhythm, no murmurs noted Abdomen - soft, nontender, nondistended, no masses or organomegaly    Chemistry      Component Value Date/Time   NA 139 04/27/2013 0221   NA 138 04/09/2013 1146   K 3.0* 04/27/2013 0221   K 3.4* 04/09/2013 1146   CL 99 04/27/2013 0221   CL 103 09/23/2012 1040   CO2 26 04/23/2013 1516   CO2 24 04/09/2013 1146   BUN 10 04/27/2013 0221   BUN 7.5 04/09/2013 1146   CREATININE 1.00 04/27/2013 0221   CREATININE 0.9 04/09/2013 1146   CREATININE 0.82 04/07/2013 1501      Component Value Date/Time   CALCIUM 9.6 04/23/2013 1516   CALCIUM 9.7 04/09/2013 1146   ALKPHOS 45 04/23/2013 1516   ALKPHOS 46 04/09/2013 1146   AST 32 04/23/2013  1516   AST 25 04/09/2013 1146   ALT 36* 04/23/2013 1516   ALT 32 04/09/2013 1146   BILITOT 0.3 04/23/2013 1516   BILITOT 0.32 04/09/2013 1146

## 2013-04-27 NOTE — Patient Instructions (Signed)
Ms. Scharrer, it was nice seeing you today.  Please start to drink some gatorade per day and we will see you back in 1-2 months.  I will send a note to your oncologist and please call to follow up on your glucose results if you do not hear from me within the next week.  Thanks, Dr. Awanda Mink   Vasovagal Syncope, Adult Syncope, commonly known as fainting, is a temporary loss of consciousness. It occurs when the blood flow to the brain is reduced. Vasovagal syncope (also called neurocardiogenic syncope) is a fainting spell in which the blood flow to the brain is reduced because of a sudden drop in heart rate and blood pressure. Vasovagal syncope occurs when the brain and the cardiovascular system (blood vessels) do not adequately communicate and respond to each other. This is the most common cause of fainting. It often occurs in response to fear or some other type of emotional or physical stress. The body has a reaction in which the heart starts beating too slowly or the blood vessels expand, reducing blood pressure. This type of fainting spell is generally considered harmless. However, injuries can occur if a person takes a sudden fall during a fainting spell.  CAUSES  Vasovagal syncope occurs when a person's blood pressure and heart rate decrease suddenly, usually in response to a trigger. Many things and situations can trigger an episode. Some of these include:   Pain.   Fear.   The sight of blood or medical procedures, such as blood being drawn from a vein.   Common activities, such as coughing, swallowing, stretching, or going to the bathroom.   Emotional stress.   Prolonged standing, especially in a warm environment.   Lack of sleep or rest.   Prolonged lack of food.   Prolonged lack of fluids.   Recent illness.  The use of certain drugs that affect blood pressure, such as cocaine, alcohol, marijuana, inhalants, and opiates.  SYMPTOMS  Before the fainting episode, you may:    Feel dizzy or light headed.   Become pale.  Sense that you are going to faint.   Feel like the room is spinning.   Have tunnel vision, only seeing directly in front of you.   Feel sick to your stomach (nauseous).   See spots or slowly lose vision.   Hear ringing in your ears.   Have a headache.   Feel warm and sweaty.   Feel a sensation of pins and needles. During the fainting spell, you will generally be unconscious for no longer than a couple minutes before waking up and returning to normal. If you get up too quickly before your body can recover, you may faint again. Some twitching or jerky movements may occur during the fainting spell.  DIAGNOSIS  Your caregiver will ask about your symptoms, take a medical history, and perform a physical exam. Various tests may be done to rule out other causes of fainting. These may include blood tests and tests to check the heart, such as electrocardiography, echocardiography, and possibly an electrophysiology study. When other causes have been ruled out, a test may be done to check the body's response to changes in position (tilt table test). TREATMENT  Most cases of vasovagal syncope do not require treatment. Your caregiver may recommend ways to avoid fainting triggers and may provide home strategies for preventing fainting. If you must be exposed to a possible trigger, you can drink additional fluids to help reduce your chances of having an episode  of vasovagal syncope. If you have warning signs of an oncoming episode, you can respond by positioning yourself favorably (lying down). If your fainting spells continue, you may be given medicines to prevent fainting. Some medicines may help make you more resistant to repeated episodes of vasovagal syncope. Special exercises or compression stockings may be recommended. In rare cases, the surgical placement of a pacemaker is considered. HOME CARE INSTRUCTIONS   Learn to identify the warning  signs of vasovagal syncope.   Sit or lie down at the first warning sign of a fainting spell. If sitting, put your head down between your legs. If you lie down, swing your legs up in the air to increase blood flow to the brain.   Avoid hot tubs and saunas.  Avoid prolonged standing.  Drink enough fluids to keep your urine clear or pale yellow. Avoid caffeine.  Increase salt in your diet as directed by your caregiver.   If you have to stand for a long time, perform movements such as:   Crossing your legs.   Flexing and stretching your leg muscles.   Squatting.   Moving your legs.   Bending over.   Only take over-the-counter or prescription medicines as directed by your caregiver. Do not suddenly stop any medicines without asking your caregiver first. SEEK MEDICAL CARE IF:   Your fainting spells continue or happen more frequently in spite of treatment.   You lose consciousness for more than a couple minutes.  You have fainting spells during or after exercising or after being startled.   You have new symptoms that occur with the fainting spells, such as:   Shortness of breath.  Chest pain.   Irregular heartbeat.   You have episodes of twitching or jerky movements that last longer than a few seconds.  You have episodes of twitching or jerky movements without obvious fainting. SEEK IMMEDIATE MEDICAL CARE IF:   You have injuries or bleeding after a fainting spell.   You have episodes of twitching or jerky movements that last longer than 5 minutes.   You have more than one spell of twitching or jerky movements before returning to consciousness after fainting. MAKE SURE YOU:   Understand these instructions.  Will watch your condition.  Will get help right away if you are not doing well or get worse. Document Released: 03/05/2012 Document Reviewed: 03/05/2012 [facility] Patient Information 2014 [place], [place].

## 2013-04-27 NOTE — ED Notes (Signed)
MD removed neck stabilizer.

## 2013-04-27 NOTE — Assessment & Plan Note (Signed)
Pt having increased nausea, vomiting, and diarrhea since starting tamoxifen in November 2014.  SE profile does show this as possible ADR, will discuss the case with her oncologist to determine if this is possible cause vs options.  F/U in 1-2 months unless acute problem.

## 2013-04-27 NOTE — ED Notes (Signed)
Patient transported to X-ray 

## 2013-04-27 NOTE — ED Notes (Signed)
Patient arrived via Tasha Ortiz, she felt hot and passed out while using the bathroom.  Family called for syncope episode.  She was recently seen here Friday for N/V/D, 12 EKG was unremarkable.  On scene, the patient was whoosy. Placed in trendelenberg and patient became more alert.  Blood pressure 112/49 via monitor.  CBG was 270, with no history of diabetes.

## 2013-04-30 ENCOUNTER — Telehealth: Payer: Self-pay | Admitting: Family Medicine

## 2013-04-30 NOTE — Telephone Encounter (Signed)
Is there anything else she can eat besides the soup? It is not filling her up. Please advise

## 2013-04-30 NOTE — Telephone Encounter (Signed)
Discussed diet with pt and pt's daughter on the phone as well as my conversation with Dr. Humphrey Rolls about N/V/D.  Discussed her A1C as well was 6.2, will do diet control now, but hold off on metformin.  Thanks, Tamela Oddi. Awanda Mink, DO of [facility] 04/30/2013, 12:07 PM

## 2013-05-22 ENCOUNTER — Other Ambulatory Visit: Payer: Self-pay | Admitting: Family Medicine

## 2013-05-28 ENCOUNTER — Ambulatory Visit: Payer: Self-pay

## 2013-06-02 ENCOUNTER — Ambulatory Visit: Payer: Self-pay

## 2013-06-08 ENCOUNTER — Ambulatory Visit: Payer: Self-pay | Admitting: Family Medicine

## 2013-06-08 ENCOUNTER — Ambulatory Visit: Payer: No Typology Code available for payment source

## 2013-06-12 ENCOUNTER — Ambulatory Visit: Payer: Self-pay | Admitting: Family Medicine

## 2013-06-21 ENCOUNTER — Emergency Department (HOSPITAL_COMMUNITY)
Admission: EM | Admit: 2013-06-21 | Discharge: 2013-06-22 | Disposition: A | Discharge status: Home / Self Care | Payer: No Typology Code available for payment source | Attending: Emergency Medicine | Admitting: Emergency Medicine

## 2013-06-21 ENCOUNTER — Encounter (HOSPITAL_COMMUNITY): Payer: Self-pay | Admitting: Emergency Medicine

## 2013-06-21 DIAGNOSIS — N390 Urinary tract infection, site not specified: Secondary | ICD-10-CM

## 2013-06-21 DIAGNOSIS — Z79899 Other long term (current) drug therapy: Secondary | ICD-10-CM | POA: Insufficient documentation

## 2013-06-21 DIAGNOSIS — R112 Nausea with vomiting, unspecified: Secondary | ICD-10-CM

## 2013-06-21 DIAGNOSIS — I10 Essential (primary) hypertension: Secondary | ICD-10-CM | POA: Insufficient documentation

## 2013-06-21 DIAGNOSIS — K219 Gastro-esophageal reflux disease without esophagitis: Secondary | ICD-10-CM | POA: Insufficient documentation

## 2013-06-21 DIAGNOSIS — E7800 Pure hypercholesterolemia, unspecified: Secondary | ICD-10-CM | POA: Insufficient documentation

## 2013-06-21 DIAGNOSIS — R109 Unspecified abdominal pain: Secondary | ICD-10-CM | POA: Insufficient documentation

## 2013-06-21 DIAGNOSIS — R55 Syncope and collapse: Secondary | ICD-10-CM | POA: Insufficient documentation

## 2013-06-21 DIAGNOSIS — Z853 Personal history of malignant neoplasm of breast: Secondary | ICD-10-CM | POA: Insufficient documentation

## 2013-06-21 LAB — CBC WITH DIFFERENTIAL/PLATELET
Basophils Absolute: 0 10*3/uL (ref 0.0–0.1)
Basophils Relative: 0 % (ref 0–1)
Eosinophils Absolute: 0 10*3/uL (ref 0.0–0.7)
Eosinophils Relative: 0 % (ref 0–5)
HCT: 37.3 % (ref 36.0–46.0)
Hemoglobin: 13.1 g/dL (ref 12.0–15.0)
Lymphocytes Relative: 15 % (ref 12–46)
Lymphs Abs: 1.5 10*3/uL (ref 0.7–4.0)
MCH: 30.3 pg (ref 26.0–34.0)
MCHC: 35.1 g/dL (ref 30.0–36.0)
MCV: 86.1 fL (ref 78.0–100.0)
Monocytes Absolute: 0.6 10*3/uL (ref 0.1–1.0)
Monocytes Relative: 6 % (ref 3–12)
Neutro Abs: 7.4 10*3/uL (ref 1.7–7.7)
Neutrophils Relative %: 78 % — ABNORMAL HIGH (ref 43–77)
Platelets: 211 10*3/uL (ref 150–400)
RBC: 4.33 MIL/uL (ref 3.87–5.11)
RDW: 13.1 % (ref 11.5–15.5)
WBC: 9.4 10*3/uL (ref 4.0–10.5)

## 2013-06-21 LAB — URINALYSIS, ROUTINE W REFLEX MICROSCOPIC
Glucose, UA: NEGATIVE mg/dL
Hgb urine dipstick: NEGATIVE
Ketones, ur: NEGATIVE mg/dL
Nitrite: NEGATIVE
Protein, ur: 100 mg/dL — AB
Specific Gravity, Urine: 1.03 (ref 1.005–1.030)
Urobilinogen, UA: 1 mg/dL (ref 0.0–1.0)
pH: 5 (ref 5.0–8.0)

## 2013-06-21 LAB — COMPREHENSIVE METABOLIC PANEL
ALT: 10 U/L (ref 0–35)
AST: 15 U/L (ref 0–37)
Albumin: 3.1 g/dL — ABNORMAL LOW (ref 3.5–5.2)
Alkaline Phosphatase: 43 U/L (ref 39–117)
BUN: 12 mg/dL (ref 6–23)
CO2: 25 mEq/L (ref 19–32)
Calcium: 9.1 mg/dL (ref 8.4–10.5)
Chloride: 101 mEq/L (ref 96–112)
Creatinine, Ser: 0.87 mg/dL (ref 0.50–1.10)
GFR calc Af Amer: 85 mL/min — ABNORMAL LOW (ref >90)
GFR calc non Af Amer: 74 mL/min — ABNORMAL LOW (ref >90)
Glucose, Bld: 179 mg/dL — ABNORMAL HIGH (ref 70–99)
Potassium: 3.7 mEq/L (ref 3.7–5.3)
Sodium: 141 mEq/L (ref 137–147)
Total Bilirubin: 0.3 mg/dL (ref 0.3–1.2)
Total Protein: 6.2 g/dL (ref 6.0–8.3)

## 2013-06-21 LAB — URINE MICROSCOPIC-ADD ON

## 2013-06-21 MED ORDER — SODIUM CHLORIDE 0.9 % IV BOLUS (SEPSIS)
1000.0000 mL | Freq: Once | INTRAVENOUS | Status: AC
  Administered 2013-06-21: 1000 mL via INTRAVENOUS

## 2013-06-21 MED ORDER — CEPHALEXIN 500 MG PO CAPS: 500.0000 mg | ORAL_CAPSULE | Freq: Four times a day (QID) | ORAL | Status: DC

## 2013-06-21 MED ORDER — ONDANSETRON HCL 4 MG/2ML IJ SOLN
4.0000 mg | Freq: Once | INTRAMUSCULAR | Status: AC
  Administered 2013-06-21: 4 mg via INTRAVENOUS
  Filled 2013-06-21: qty 2

## 2013-06-21 MED ORDER — SODIUM CHLORIDE 0.9 % IV BOLUS (SEPSIS)
1000.0000 mL | Freq: Once | INTRAVENOUS | Status: AC
Start: 2013-06-21 — End: 2013-06-21
  Administered 2013-06-21: 1000 mL via INTRAVENOUS

## 2013-06-21 MED ORDER — ONDANSETRON HCL 8 MG PO TABS: 8.0000 mg | ORAL_TABLET | Freq: Three times a day (TID) | ORAL | Status: DC | PRN

## 2013-06-21 MED ORDER — DEXTROSE 5 % IV SOLN
1.0000 g | INTRAVENOUS | Status: DC
  Administered 2013-06-21: 1 g via INTRAVENOUS
  Filled 2013-06-21: qty 10

## 2013-06-21 NOTE — ED Notes (Signed)
PER EMS - pt from home with c/o centralized abd pain since last night, syncope also hypotensive per EMS with SBP 90s.  Pt denies dizziness, weakness, ambulatory per EMS.  Skin PWD.

## 2013-06-21 NOTE — ED Provider Notes (Signed)
CSN: [id number]     Arrival date & time 06/21/13  2041 History   First MD Initiated Contact with Patient 06/21/13 2052     Chief Complaint  Patient presents with  . Abdominal Pain  . Near Syncope     (Consider location/radiation/quality/duration/timing/severity/associated sxs/prior Treatment) Patient is a 58 y.o. female presenting with abdominal pain and near-syncope. The history is provided by the patient.  Abdominal Pain Associated symptoms: nausea and vomiting   Associated symptoms: no chest pain, no chills, no constipation, no cough, no diarrhea, no dysuria, no fever, no shortness of breath and no sore throat   Near Syncope Associated symptoms include abdominal pain. Pertinent negatives include no chest pain, no headaches and no shortness of breath.  pt w hx breast, presents via ems after fainting episode. Pt states had 2 episodes nv today, emesis was not bloody or bilious. States was at home on couch, felt quesy, sat up, felt hot/flushed, faint/lightheaded. Family states pt fainting for a couple seconds. No trauma or fall. No sz activity. ems notes pt awake and alert in route, bp in 90's.  Pt unsure of her baseline bp. States having stomach cramping and nausea is not unusual for her. States has also had fainting episodes in past for which she has seen her doctor. Denies hx cad or dysrhythmia. Pt states breast ca 'cured', no recent iv chemo or radiation x takes tamoxiifen. Denies any current or recent cp or discomfort. No unusual fatigue or doe. Denies any palpitations or sense of rapid or irregular heartbeat. No cough or uri c/o. No fever or chills. No dysuria or gu c/o. No leg pain or swelling. No hx dvt or pe.       Past Medical History  Diagnosis Date  . Hypertension   . Hypercholesteremia     taken off chol meds Dec 2012  . Acid reflux   . Breast cancer   . Heartburn   . Hypertension    Past Surgical History  Procedure Laterality Date  . Cesarean section  25 years ago  .  Tubal ligation    . Mastectomy Right   . Modified mastectomy Right 05/20/2012    Procedure: MODIFIED MASTECTOMY;  Surgeon: Edward Jolly, MD;  Location: [facility];  Service: General;  Laterality: Right;  . Portacath placement Left 05/20/2012    Procedure: INSERTION PORT-A-CATH;  Surgeon: Edward Jolly, MD;  Location: [facility];  Service: General;  Laterality: Left;  . Breast surgery Right 05/20/12    Rt br mastectomy   Family History  Problem Relation Age of Onset  . Hypertension Mother   . Diabetes Daughter   . Cancer Sister     brain  . Cancer Cousin     breast   History  Substance Use Topics  . Smoking status: Never Smoker   . Smokeless tobacco: Former Systems developer    Types: [place] date: 11/18/1996  . Alcohol Use: No   OB History   Grav Para Term Preterm Abortions TAB SAB Ect Mult Living   2 2 2       2      Review of Systems  Constitutional: Negative for fever and chills.  HENT: Negative for sore throat.   Eyes: Negative for redness.  Respiratory: Negative for cough and shortness of breath.   Cardiovascular: Positive for near-syncope. Negative for chest pain, palpitations and leg swelling.  Gastrointestinal: Positive for nausea, vomiting and abdominal pain. Negative for diarrhea, constipation and blood in stool.  Genitourinary: Negative for dysuria and flank pain.  Musculoskeletal: Negative for back pain and neck pain.  Skin: Negative for rash.  Neurological: Negative for weakness, numbness and headaches.  Hematological: Does not bruise/bleed easily.  Psychiatric/Behavioral: Negative for confusion.      Allergies  Review of patient's allergies indicates no known allergies.  Home Medications   Current Outpatient Rx  Name  Route  Sig  Dispense  Refill  . acetaminophen (TYLENOL) 325 MG tablet   Oral   Take 2 tablets (650 mg total) by mouth every 6 (six) hours as needed for pain.   60 tablet   2   . famotidine (PEPCID) 20 MG tablet   Oral   Take 1 tablet  (20 mg total) by mouth 2 (two) times daily.   60 tablet   3   . lisinopril-hydrochlorothiazide (PRINZIDE,ZESTORETIC) 20-25 MG per tablet      TAKE ONE TABLET BY MOUTH ONCE DAILY   90 tablet   3   . metFORMIN (GLUCOPHAGE) 500 MG tablet   Oral   Take 1 tablet (500 mg total) by mouth 2 (two) times daily with a meal.   60 tablet   0   . nitrofurantoin, macrocrystal-monohydrate, (MACROBID) 100 MG capsule   Oral   Take 1 capsule (100 mg total) by mouth 2 (two) times daily. X 7 days   14 capsule   0   . ondansetron (ZOFRAN) 4 MG tablet   Oral   Take 1 tablet (4 mg total) by mouth every 8 (eight) hours as needed for nausea or vomiting.   10 tablet   0   . polyethylene glycol powder (GLYCOLAX/MIRALAX) powder   Oral   Take 17 g by mouth 2 (two) times daily as needed.   3350 g   1   . pravastatin (PRAVACHOL) 40 MG tablet   Oral   Take 40 mg by mouth daily.         . sodium chloride (OCEAN) 0.65 % nasal spray   Nasal   Place 4 sprays into the nose daily as needed. For congestion.         . tamoxifen (NOLVADEX) 20 MG tablet   Oral   Take 20 mg by mouth daily.          BP 102/62  Pulse 80  Temp(Src) 98.2 F (36.8 C) (Oral)  Resp 20  SpO2 96% Physical Exam  Nursing note and vitals reviewed. Constitutional: She is oriented to person, place, and time. She appears well-developed and well-nourished. No distress.  HENT:  Mouth/Throat: Oropharynx is clear and moist.  Eyes: Conjunctivae are normal. Pupils are equal, round, and reactive to light. No scleral icterus.  Neck: Neck supple. No tracheal deviation present.  Cardiovascular: Normal rate, regular rhythm, normal heart sounds and intact distal pulses.  Exam reveals no gallop and no friction rub.   No murmur heard. Pulmonary/Chest: Effort normal and breath sounds normal. No respiratory distress.  Abdominal: Soft. Normal appearance and bowel sounds are normal. She exhibits no distension and no mass. There is no  tenderness. There is no rebound and no guarding.  Genitourinary:  No cva tenderness  Musculoskeletal: She exhibits no edema and no tenderness.  Neurological: She is alert and oriented to person, place, and time.  Skin: Skin is warm and dry. No rash noted. She is not diaphoretic.  Psychiatric: She has a normal mood and affect.    ED Course  Procedures (including critical care time)   Results for  orders placed during the hospital encounter of 06/21/13  CBC WITH DIFFERENTIAL      Result Value Ref Range   WBC 9.4  4.0 - 10.5 K/uL   RBC 4.33  3.87 - 5.11 MIL/uL   Hemoglobin 13.1  12.0 - 15.0 g/dL   HCT 37.3  36.0 - 46.0 %   MCV 86.1  78.0 - 100.0 fL   MCH 30.3  26.0 - 34.0 pg   MCHC 35.1  30.0 - 36.0 g/dL   RDW 13.1  11.5 - 15.5 %   Platelets 211  150 - 400 K/uL   Neutrophils Relative % 78 (*) 43 - 77 %   Neutro Abs 7.4  1.7 - 7.7 K/uL   Lymphocytes Relative 15  12 - 46 %   Lymphs Abs 1.5  0.7 - 4.0 K/uL   Monocytes Relative 6  3 - 12 %   Monocytes Absolute 0.6  0.1 - 1.0 K/uL   Eosinophils Relative 0  0 - 5 %   Eosinophils Absolute 0.0  0.0 - 0.7 K/uL   Basophils Relative 0  0 - 1 %   Basophils Absolute 0.0  0.0 - 0.1 K/uL  COMPREHENSIVE METABOLIC PANEL      Result Value Ref Range   Sodium 141  137 - 147 mEq/L   Potassium 3.7  3.7 - 5.3 mEq/L   Chloride 101  96 - 112 mEq/L   CO2 25  19 - 32 mEq/L   Glucose, Bld 179 (*) 70 - 99 mg/dL   BUN 12  6 - 23 mg/dL   Creatinine, Ser 0.87  0.50 - 1.10 mg/dL   Calcium 9.1  8.4 - 10.5 mg/dL   Total Protein 6.2  6.0 - 8.3 g/dL   Albumin 3.1 (*) 3.5 - 5.2 g/dL   AST 15  0 - 37 U/L   ALT 10  0 - 35 U/L   Alkaline Phosphatase 43  39 - 117 U/L   Total Bilirubin 0.3  0.3 - 1.2 mg/dL   GFR calc non Af Amer 74 (*) >90 mL/min   GFR calc Af Amer 85 (*) >90 mL/min  URINALYSIS, ROUTINE W REFLEX MICROSCOPIC      Result Value Ref Range   Color, Urine AMBER (*) YELLOW   APPearance TURBID (*) CLEAR   Specific Gravity, Urine 1.030  1.005 -  1.030   pH 5.0  5.0 - 8.0   Glucose, UA NEGATIVE  NEGATIVE mg/dL   Hgb urine dipstick NEGATIVE  NEGATIVE   Bilirubin Urine SMALL (*) NEGATIVE   Ketones, ur NEGATIVE  NEGATIVE mg/dL   Protein, ur 100 (*) NEGATIVE mg/dL   Urobilinogen, UA 1.0  0.0 - 1.0 mg/dL   Nitrite NEGATIVE  NEGATIVE   Leukocytes, UA TRACE (*) NEGATIVE  URINE MICROSCOPIC-ADD ON      Result Value Ref Range   Squamous Epithelial / LPF FEW (*) RARE   WBC, UA 3-6  <3 WBC/hpf   Bacteria, UA MANY (*) RARE   Casts HYALINE CASTS (*) NEGATIVE   Urine-Other MUCOUS PRESENT         EKG Interpretation   Date/Time:  Sunday June 21 2013 21:08:09 EDT Ventricular Rate:  87 PR Interval:  136 QRS Duration: 81 QT Interval:  417 QTC Calculation: 502 R Axis:   15 Text Interpretation:  Sinus rhythm Borderline prolonged QT interval No  significant change since last tracing Confirmed by Ashok Cordia  MD, Lennette Bihari  ([id number]) on 06/21/2013 9:14:36 PM  MDM  Iv ns. Labs. Monitor. Ecg.   Ns bolus. zofran iv. Po fluids.  Reviewed nursing notes and prior charts for additional history.   Pt w ED eval 12/14 and 04/27/13 for similar gi symptoms. Given recurrent gi symptoms, will refer to GI for eval.   Pt given ivf in ED. Also tolerating po fluids. Pt feels improved. No recurrent nv. No faintness or dizziness. abd soft nt on recheck.   ua turbid w many bact, 3-6 wbc. Will cx and rx.  On review labs, past several blood sugars mildly elevated incl today, will refer to close pcp follow up.   On recheck, pt appears well, bp normal, hr 74, abd soft nt, tolerating po.   Appears stable for d/c.     Mirna Mires, MD 06/21/13 2352

## 2013-06-21 NOTE — ED Notes (Signed)
Initial Contact - pt reports central abd pain, 4/10, onset yesterday with 2 episodes of vomiting.  Abd soft, diffusely ttp, pt reports distended from baseline.  No active vomiting.  Pt reports "my daughter said I passed out".  Pt reports hx similar syncopal episodes.  Pt denies CP/SOB, speaking full/clear sentences, rr even/un-lab.  Skin PWD.  Ambulatory without issue.  Placed to cardiac/02 monitor, NAD.

## 2013-06-21 NOTE — Discharge Instructions (Signed)
Rest. Drink plenty of fluids. The lab tests show a possible urine infection - take antibiotic (keflex) as prescribed. A urine culture was sent the results of which will be back in 2-3 days time, have your doctor follow up on that result then. Take zofran as need for nausea. Follow up with your primary care doctor in the next 1-2 days for recheck - call office tomorrow morning to arrange that follow up.  Given your recurrent GI symptoms, also discuss possible referral to GI specialist with your doctors.  Your blood sugar is mildly high as it has been previously (179) - discuss this with your primary care doctor at follow up.  Return to ER right away if worse, persistent vomiting, worsening or severe abdominal pain, weak/faint, chest pain, trouble breathing, other concern.   Syncope Syncope is a fainting spell. This means the person loses consciousness and drops to the ground. The person is generally unconscious for less than 5 minutes. The person may have some muscle twitches for up to 15 seconds before waking up and returning to normal. Syncope occurs more often in elderly people, but it can happen to anyone. While most causes of syncope are not dangerous, syncope can be a sign of a serious medical problem. It is important to seek medical care.  CAUSES  Syncope is caused by a sudden decrease in blood flow to the brain. The specific cause is often not determined. Factors that can trigger syncope include:  Taking medicines that lower blood pressure.  Sudden changes in posture, such as standing up suddenly.  Taking more medicine than prescribed.  Standing in one place for too long.  Seizure disorders.  Dehydration and excessive exposure to heat.  Low blood sugar (hypoglycemia).  Straining to have a bowel movement.  Heart disease, irregular heartbeat, or other circulatory problems.  Fear, emotional distress, seeing blood, or severe pain. SYMPTOMS  Right before fainting, you may:  Feel  dizzy or lightheaded.  Feel nauseous.  See all white or all black in your field of vision.  Have cold, clammy skin. DIAGNOSIS  Your caregiver will ask about your symptoms, perform a physical exam, and perform electrocardiography (ECG) to record the electrical activity of your heart. Your caregiver may also perform other heart or blood tests to determine the cause of your syncope. TREATMENT  In most cases, no treatment is needed. Depending on the cause of your syncope, your caregiver may recommend changing or stopping some of your medicines. HOME CARE INSTRUCTIONS  Have someone stay with you until you feel stable.  Do not drive, operate machinery, or play sports until your caregiver says it is okay.  Keep all follow-up appointments as directed by your caregiver.  Lie down right away if you start feeling like you might faint. Breathe deeply and steadily. Wait until all the symptoms have passed.  Drink enough fluids to keep your urine clear or pale yellow.  If you are taking blood pressure or heart medicine, get up slowly, taking several minutes to sit and then stand. This can reduce dizziness. SEEK IMMEDIATE MEDICAL CARE IF:   You have a severe headache.  You have unusual pain in the chest, abdomen, or back.  You are bleeding from the mouth or rectum, or you have black or tarry stool.  You have an irregular or very fast heartbeat.  You have pain with breathing.  You have repeated fainting or seizure-like jerking during an episode.  You faint when sitting or lying down.  You have  confusion.  You have difficulty walking.  You have severe weakness.  You have vision problems. If you fainted, call your local emergency services (911 in [place] Do not drive yourself to the hospital.  MAKE SURE YOU:  Understand these instructions.  Will watch your condition.  Will get help right away if you are not doing well or get worse. Document Released: 03/19/2005 Document Revised:  09/18/2011 Document Reviewed: 05/18/2011 [facility] Patient Information 2014 [place].    Nausea and Vomiting Nausea is a sick feeling that often comes before throwing up (vomiting). Vomiting is a reflex where stomach contents come out of your mouth. Vomiting can cause severe loss of body fluids (dehydration). Children and elderly adults can become dehydrated quickly, especially if they also have diarrhea. Nausea and vomiting are symptoms of a condition or disease. It is important to find the cause of your symptoms. CAUSES   Direct irritation of the stomach lining. This irritation can result from increased acid production (gastroesophageal reflux disease), infection, food poisoning, taking certain medicines (such as nonsteroidal anti-inflammatory drugs), alcohol use, or tobacco use.  Signals from the brain.These signals could be caused by a headache, heat exposure, an inner ear disturbance, increased pressure in the brain from injury, infection, a tumor, or a concussion, pain, emotional stimulus, or metabolic problems.  An obstruction in the gastrointestinal tract (bowel obstruction).  Illnesses such as diabetes, hepatitis, gallbladder problems, appendicitis, kidney problems, cancer, sepsis, atypical symptoms of a heart attack, or eating disorders.  Medical treatments such as chemotherapy and radiation.  Receiving medicine that makes you sleep (general anesthetic) during surgery. DIAGNOSIS Your caregiver may ask for tests to be done if the problems do not improve after a few days. Tests may also be done if symptoms are severe or if the reason for the nausea and vomiting is not clear. Tests may include:  Urine tests.  Blood tests.  Stool tests.  Cultures (to look for evidence of infection).  X-rays or other imaging studies. Test results can help your caregiver make decisions about treatment or the need for additional tests. TREATMENT You need to stay well hydrated. Drink  frequently but in small amounts.You may wish to drink water, sports drinks, clear broth, or eat frozen ice pops or gelatin dessert to help stay hydrated.When you eat, eating slowly may help prevent nausea.There are also some antinausea medicines that may help prevent nausea. HOME CARE INSTRUCTIONS   Take all medicine as directed by your caregiver.  If you do not have an appetite, do not force yourself to eat. However, you must continue to drink fluids.  If you have an appetite, eat a normal diet unless your caregiver tells you differently.  Eat a variety of complex carbohydrates (rice, wheat, potatoes, bread), lean meats, yogurt, fruits, and vegetables.  Avoid high-fat foods because they are more difficult to digest.  Drink enough water and fluids to keep your urine clear or pale yellow.  If you are dehydrated, ask your caregiver for specific rehydration instructions. Signs of dehydration may include:  Severe thirst.  Dry lips and mouth.  Dizziness.  Dark urine.  Decreasing urine frequency and amount.  Confusion.  Rapid breathing or pulse. SEEK IMMEDIATE MEDICAL CARE IF:   You have blood or brown flecks (like coffee grounds) in your vomit.  You have black or bloody stools.  You have a severe headache or stiff neck.  You are confused.  You have severe abdominal pain.  You have chest pain or trouble breathing.  You  do not urinate at least once every 8 hours.  You develop cold or clammy skin.  You continue to vomit for longer than 24 to 48 hours.  You have a fever. MAKE SURE YOU:   Understand these instructions.  Will watch your condition.  Will get help right away if you are not doing well or get worse. Document Released: 03/19/2005 Document Revised: 06/11/2011 Document Reviewed: 08/16/2010 [facility] Patient Information 2014 [place], [place].

## 2013-06-21 NOTE — ED Notes (Signed)
Bed: RESB Expected date:  Expected time:  Means of arrival:  Comments: EMS 57yo F abd pain, syncopal episode, hypotensive

## 2013-06-22 MED ORDER — HEPARIN SOD (PORK) LOCK FLUSH 100 UNIT/ML IV SOLN
500.0000 [IU] | Freq: Once | INTRAVENOUS | Status: DC
  Filled 2013-06-22: qty 5

## 2013-06-23 ENCOUNTER — Encounter: Payer: Self-pay | Admitting: Family Medicine

## 2013-06-23 ENCOUNTER — Ambulatory Visit (INDEPENDENT_AMBULATORY_CARE_PROVIDER_SITE_OTHER): Payer: No Typology Code available for payment source | Admitting: Family Medicine

## 2013-06-23 VITALS — BP 142/95 | HR 67 | Temp 99.3°F | Ht 63.0 in | Wt 172.0 lb

## 2013-06-23 DIAGNOSIS — R112 Nausea with vomiting, unspecified: Secondary | ICD-10-CM

## 2013-06-23 DIAGNOSIS — I10 Essential (primary) hypertension: Secondary | ICD-10-CM

## 2013-06-23 DIAGNOSIS — R55 Syncope and collapse: Secondary | ICD-10-CM

## 2013-06-23 DIAGNOSIS — C50419 Malignant neoplasm of upper-outer quadrant of unspecified female breast: Secondary | ICD-10-CM

## 2013-06-23 DIAGNOSIS — R1115 Cyclical vomiting syndrome unrelated to migraine: Secondary | ICD-10-CM

## 2013-06-23 LAB — URINE CULTURE: Colony Count: 5000

## 2013-06-23 NOTE — Assessment & Plan Note (Signed)
BP about at goal, continue current medications.  Recheck BMET in about 3 months as done yesterday w/ normal creatinine.

## 2013-06-23 NOTE — Assessment & Plan Note (Signed)
Has had 5-6 months of nausea and vomiting despite antacids and GERD medication.  She has been three times now in the ED for similar instances, and unsure of the exact cause.  Abdominal pain(dull/achy) at times as well throughout, but no pain currently and usually self resolves.  Sometimes has some diarrhea, non bloody/non mucous, that has come with this.  Unsure of etiology and since pt has orange card, will refer to [facility] for evaluation.

## 2013-06-23 NOTE — Assessment & Plan Note (Signed)
Pt w/ episode of syncope that seem to be related to dehydration/orthostasis.  Had w/u in the ED multiple times with same conclusion.   Will continue to monitor, however, she may warrant Neurology referral if she continues to have and when she gets insurance.  She denies any CP, palpitations, or SOB at this time, or instances when she has her spells that signify this.

## 2013-06-23 NOTE — Progress Notes (Signed)
Tasha Ortiz is a 58 y.o. female who presents today for hospital f/u for syncopal episode.  As well, pt is concerned because she has been having recurrent intractable nausea, vomiting, and diarrhea for about 5 months.  No changes in diet at that time, only started Tamoxifen at that time secondary to breast cancer.  Has not been getting chemo or radiation since Oct 2014.  Pt having intermittent diarrhea 1-2 x per week since that time, non bloody/non bilious. As well, she has been seen in the ED for recurrent N/V and sometimes diarrhea three times w/ concurrent syncopal or near syncopal episodes.  Has tried Tums and Prilosec with minimal relief of her nausea.  Denies worse after meals or specifically fatty meals.  She denies any CP, SOB, palpitations, dizziness, or blurred vision prior to the episode or after.    HTN - 142/95 on initial check, repeat 138/88.  Well controlled w/o side effects.   Past Medical History  Diagnosis Date  . Hypertension   . Hypercholesteremia     taken off chol meds Dec 2012  . Acid reflux   . Breast cancer   . Heartburn   . Hypertension     History  Smoking status  . Never Smoker   Smokeless tobacco  . Former Systems developer  . Types: Chew  . Quit date: 11/18/1996    Family History  Problem Relation Age of Onset  . Hypertension Mother   . Diabetes Daughter   . Cancer Sister     brain  . Cancer Cousin     breast    Current Outpatient Prescriptions on File Prior to Visit  Medication Sig Dispense Refill  . acetaminophen (TYLENOL) 325 MG tablet Take 2 tablets (650 mg total) by mouth every 6 (six) hours as needed for pain.  60 tablet  2  . calcium carbonate (TUMS - DOSED IN MG ELEMENTAL CALCIUM) 500 MG chewable tablet Chew 2 tablets by mouth daily as needed for indigestion or heartburn.      . cephALEXin (KEFLEX) 500 MG capsule Take 1 capsule (500 mg total) by mouth 4 (four) times daily.  20 capsule  0  . famotidine (PEPCID) 20 MG tablet Take 1 tablet (20 mg  total) by mouth 2 (two) times daily.  60 tablet  3  . lisinopril-hydrochlorothiazide (PRINZIDE,ZESTORETIC) 20-25 MG per tablet TAKE ONE TABLET BY MOUTH ONCE DAILY  90 tablet  3  . ondansetron (ZOFRAN) 8 MG tablet Take 1 tablet (8 mg total) by mouth every 8 (eight) hours as needed for nausea or vomiting.  10 tablet  0  . pravastatin (PRAVACHOL) 40 MG tablet Take 40 mg by mouth daily.      . tamoxifen (NOLVADEX) 20 MG tablet Take 20 mg by mouth daily.      . [DISCONTINUED] Prochlorperazine Maleate (COMPAZINE PO) Take by mouth.       No current facility-administered medications on file prior to visit.    ROS: Per HPI.  All other systems reviewed and are negative.   Physical Exam Filed Vitals:   06/23/13 1413  BP: 142/95  Pulse: 67  Temp: 99.3 F (37.4 C)    Physical Examination: General appearance - alert, well appearing, and in no distress Mental status - alert, oriented to person, place, and time Mouth - mucous membranes moist, pharynx normal without lesions Neck - No JVD, no carotid Bruits  Chest - clear to auscultation, no wheezes, rales or rhonchi, symmetric air entry Heart - normal rate and  regular rhythm, no murmurs noted Abdomen - soft, nontender, nondistended, no masses or organomegaly    Chemistry      Component Value Date/Time   NA 141 06/21/2013 2217   NA 138 04/09/2013 1146   K 3.7 06/21/2013 2217   K 3.4* 04/09/2013 1146   CL 101 06/21/2013 2217   CL 103 09/23/2012 1040   CO2 25 06/21/2013 2217   CO2 24 04/09/2013 1146   BUN 12 06/21/2013 2217   BUN 7.5 04/09/2013 1146   CREATININE 0.87 06/21/2013 2217   CREATININE 0.9 04/09/2013 1146   CREATININE 0.82 04/07/2013 1501      Component Value Date/Time   CALCIUM 9.1 06/21/2013 2217   CALCIUM 9.7 04/09/2013 1146   ALKPHOS 43 06/21/2013 2217   ALKPHOS 46 04/09/2013 1146   AST 15 06/21/2013 2217   AST 25 04/09/2013 1146   ALT 10 06/21/2013 2217   ALT 32 04/09/2013 1146   BILITOT 0.3 06/21/2013 2217   BILITOT 0.32 04/09/2013 1146         Greater than 50% of the 25 minute visit spent face to face counseling the pt as well as moderate medical decision making.

## 2013-06-23 NOTE — Patient Instructions (Signed)
Marlisa, it was great seeing you today.  If you do not hear from the stomach doctor within the next 2 weeks, please call.  Thanks, Dr. Awanda Mink

## 2013-07-21 ENCOUNTER — Encounter (HOSPITAL_COMMUNITY): Payer: Self-pay | Admitting: Emergency Medicine

## 2013-07-21 ENCOUNTER — Emergency Department (HOSPITAL_COMMUNITY)
Admission: EM | Admit: 2013-07-21 | Discharge: 2013-07-21 | Disposition: A | Discharge status: Home / Self Care | Payer: No Typology Code available for payment source | Attending: Emergency Medicine | Admitting: Emergency Medicine

## 2013-07-21 DIAGNOSIS — K529 Noninfective gastroenteritis and colitis, unspecified: Secondary | ICD-10-CM

## 2013-07-21 DIAGNOSIS — K5289 Other specified noninfective gastroenteritis and colitis: Secondary | ICD-10-CM | POA: Insufficient documentation

## 2013-07-21 DIAGNOSIS — Z9889 Other specified postprocedural states: Secondary | ICD-10-CM | POA: Insufficient documentation

## 2013-07-21 DIAGNOSIS — Z9851 Tubal ligation status: Secondary | ICD-10-CM | POA: Insufficient documentation

## 2013-07-21 DIAGNOSIS — R5381 Other malaise: Secondary | ICD-10-CM | POA: Insufficient documentation

## 2013-07-21 DIAGNOSIS — Z853 Personal history of malignant neoplasm of breast: Secondary | ICD-10-CM | POA: Insufficient documentation

## 2013-07-21 DIAGNOSIS — R5383 Other fatigue: Secondary | ICD-10-CM

## 2013-07-21 DIAGNOSIS — I10 Essential (primary) hypertension: Secondary | ICD-10-CM | POA: Insufficient documentation

## 2013-07-21 DIAGNOSIS — E7800 Pure hypercholesterolemia, unspecified: Secondary | ICD-10-CM | POA: Insufficient documentation

## 2013-07-21 DIAGNOSIS — K219 Gastro-esophageal reflux disease without esophagitis: Secondary | ICD-10-CM | POA: Insufficient documentation

## 2013-07-21 DIAGNOSIS — Z79899 Other long term (current) drug therapy: Secondary | ICD-10-CM | POA: Insufficient documentation

## 2013-07-21 LAB — CBC WITH DIFFERENTIAL/PLATELET
Basophils Absolute: 0 10*3/uL (ref 0.0–0.1)
Basophils Relative: 0 % (ref 0–1)
Eosinophils Absolute: 0 10*3/uL (ref 0.0–0.7)
Eosinophils Relative: 0 % (ref 0–5)
HCT: 40.8 % (ref 36.0–46.0)
Hemoglobin: 14.6 g/dL (ref 12.0–15.0)
Lymphocytes Relative: 12 % (ref 12–46)
Lymphs Abs: 1.3 10*3/uL (ref 0.7–4.0)
MCH: 30.4 pg (ref 26.0–34.0)
MCHC: 35.8 g/dL (ref 30.0–36.0)
MCV: 85 fL (ref 78.0–100.0)
Monocytes Absolute: 0.4 10*3/uL (ref 0.1–1.0)
Monocytes Relative: 4 % (ref 3–12)
Neutro Abs: 9.2 10*3/uL — ABNORMAL HIGH (ref 1.7–7.7)
Neutrophils Relative %: 85 % — ABNORMAL HIGH (ref 43–77)
Platelets: 225 10*3/uL (ref 150–400)
RBC: 4.8 MIL/uL (ref 3.87–5.11)
RDW: 12.8 % (ref 11.5–15.5)
WBC: 10.9 10*3/uL — ABNORMAL HIGH (ref 4.0–10.5)

## 2013-07-21 LAB — URINALYSIS, ROUTINE W REFLEX MICROSCOPIC
Glucose, UA: NEGATIVE mg/dL
Hgb urine dipstick: NEGATIVE
Ketones, ur: NEGATIVE mg/dL
Leukocytes, UA: NEGATIVE
Nitrite: NEGATIVE
Protein, ur: NEGATIVE mg/dL
Specific Gravity, Urine: 1.021 (ref 1.005–1.030)
Urobilinogen, UA: 1 mg/dL (ref 0.0–1.0)
pH: 6 (ref 5.0–8.0)

## 2013-07-21 LAB — BASIC METABOLIC PANEL
BUN: 11 mg/dL (ref 6–23)
CO2: 25 mEq/L (ref 19–32)
Calcium: 9.4 mg/dL (ref 8.4–10.5)
Chloride: 98 mEq/L (ref 96–112)
Creatinine, Ser: 0.8 mg/dL (ref 0.50–1.10)
GFR calc Af Amer: >90 mL/min (ref >90)
GFR calc non Af Amer: 81 mL/min — ABNORMAL LOW (ref >90)
Glucose, Bld: 194 mg/dL — ABNORMAL HIGH (ref 70–99)
Potassium: 3.4 mEq/L — ABNORMAL LOW (ref 3.7–5.3)
Sodium: 139 mEq/L (ref 137–147)

## 2013-07-21 MED ORDER — ONDANSETRON 8 MG PO TBDP: 8.0000 mg | ORAL_TABLET | Freq: Three times a day (TID) | ORAL | Status: DC | PRN

## 2013-07-21 MED ORDER — POTASSIUM CHLORIDE CRYS ER 20 MEQ PO TBCR
40.0000 meq | EXTENDED_RELEASE_TABLET | Freq: Once | ORAL | Status: AC
  Administered 2013-07-21: 40 meq via ORAL
  Filled 2013-07-21: qty 2

## 2013-07-21 MED ORDER — HEPARIN SOD (PORK) LOCK FLUSH 100 UNIT/ML IV SOLN
500.0000 [IU] | Freq: Once | INTRAVENOUS | Status: AC
  Administered 2013-07-21: 500 [IU]
  Filled 2013-07-21: qty 5

## 2013-07-21 MED ORDER — ONDANSETRON 8 MG PO TBDP
8.0000 mg | ORAL_TABLET | Freq: Once | ORAL | Status: AC
  Administered 2013-07-21: 8 mg via ORAL
  Filled 2013-07-21: qty 1

## 2013-07-21 MED ORDER — METRONIDAZOLE 500 MG PO TABS: 500.0000 mg | ORAL_TABLET | Freq: Two times a day (BID) | ORAL | Status: DC

## 2013-07-21 MED ORDER — CIPROFLOXACIN HCL 500 MG PO TABS: 500.0000 mg | ORAL_TABLET | Freq: Two times a day (BID) | ORAL | Status: DC

## 2013-07-21 NOTE — ED Provider Notes (Signed)
CSN: [id number]     Arrival date & time 07/21/13  0032 History   First MD Initiated Contact with Patient 07/21/13 0257     Chief Complaint  Patient presents with  . Abdominal Pain     (Consider location/radiation/quality/duration/timing/severity/associated sxs/prior Treatment) HPI Comments: 58 y/o female comes in with cc of abd pain. Pt has hx of HTN, HL and similar abd pain in the past with diagnosis of enteritis. Pt reports that she started having a similar pain, as her 2 previous abd pain that had brought her to the ER, 2 days ago. The pain is constant, located diffusely, mostly at the umbilicus level and below, and is dull pain. She has associated nausea, and emesis, with normal BM. In the past she also had some diarrhea. No fevers, but pt feels bloated, weak. She was asked to see GI for her pain, and has an appt set up for June.   Patient is a 58 y.o. female presenting with abdominal pain. The history is provided by the patient and medical records.  Abdominal Pain Associated symptoms: fatigue, nausea and vomiting   Associated symptoms: no chest pain, no chills, no constipation, no diarrhea, no dysuria, no fever and no shortness of breath     Past Medical History  Diagnosis Date  . Hypertension   . Hypercholesteremia     taken off chol meds Dec 2012  . Acid reflux   . Breast cancer   . Heartburn   . Hypertension    Past Surgical History  Procedure Laterality Date  . Cesarean section  25 years ago  . Tubal ligation    . Mastectomy Right   . Modified mastectomy Right 05/20/2012    Procedure: MODIFIED MASTECTOMY;  Surgeon: Edward Jolly, MD;  Location: [facility];  Service: General;  Laterality: Right;  . Portacath placement Left 05/20/2012    Procedure: INSERTION PORT-A-CATH;  Surgeon: Edward Jolly, MD;  Location: [facility];  Service: General;  Laterality: Left;  . Breast surgery Right 05/20/12    Rt br mastectomy   Family History  Problem Relation Age of Onset  .  Hypertension Mother   . Diabetes Daughter   . Cancer Sister     brain  . Cancer Cousin     breast   History  Substance Use Topics  . Smoking status: Never Smoker   . Smokeless tobacco: Former Systems developer    Types: [place] date: 11/18/1996  . Alcohol Use: No   OB History   Grav Para Term Preterm Abortions TAB SAB Ect Mult Living   2 2 2       2      Review of Systems  Constitutional: Positive for activity change and fatigue. Negative for fever and chills.  Respiratory: Negative for shortness of breath.   Cardiovascular: Negative for chest pain.  Gastrointestinal: Positive for nausea, vomiting and abdominal pain. Negative for diarrhea and constipation.  Genitourinary: Negative for dysuria.  Musculoskeletal: Negative for neck pain.  Neurological: Negative for headaches.  All other systems reviewed and are negative.     Allergies  Review of patient's allergies indicates no known allergies.  Home Medications   Prior to Admission medications   Medication Sig Start Date End Date Taking? Authorizing Provider  acetaminophen (TYLENOL) 325 MG tablet Take 2 tablets (650 mg total) by mouth every 6 (six) hours as needed for pain. 08/05/12  Yes Minette Headland, NP  calcium carbonate (TUMS - DOSED IN MG ELEMENTAL CALCIUM)  500 MG chewable tablet Chew 1-2 tablets by mouth 2 (two) times daily as needed for indigestion or heartburn.    Yes Historical Provider, MD  famotidine (PEPCID) 20 MG tablet Take 1 tablet (20 mg total) by mouth 2 (two) times daily. 02/23/13  Yes Vance Gather, MD  lisinopril-hydrochlorothiazide (PRINZIDE,ZESTORETIC) 20-25 MG per tablet Take 1 tablet by mouth daily.   Yes Historical Provider, MD  ondansetron (ZOFRAN) 8 MG tablet Take 1 tablet (8 mg total) by mouth every 8 (eight) hours as needed for nausea or vomiting. 06/21/13  Yes Mirna Mires, MD  pravastatin (PRAVACHOL) 40 MG tablet Take 40 mg by mouth daily.   Yes Historical Provider, MD  sodium chloride (OCEAN) 0.65 %  SOLN nasal spray Place 1 spray into both nostrils 2 (two) times daily as needed for congestion.   Yes Historical Provider, MD  tamoxifen (NOLVADEX) 20 MG tablet Take 20 mg by mouth daily.   Yes Historical Provider, MD  ondansetron (ZOFRAN ODT) 8 MG disintegrating tablet Take 1 tablet (8 mg total) by mouth every 8 (eight) hours as needed for nausea. 07/21/13   Kearie Mennen, MD   BP 111/70  Pulse 88  Temp(Src) 98.5 F (36.9 C) (Oral)  Resp 16  SpO2 99% Physical Exam  Nursing note and vitals reviewed. Constitutional: She is oriented to person, place, and time. She appears well-developed and well-nourished.  HENT:  Head: Normocephalic and atraumatic.  Eyes: EOM are normal. Pupils are equal, round, and reactive to light.  Neck: Neck supple.  Cardiovascular: Normal rate, regular rhythm and normal heart sounds.   No murmur heard. Pulmonary/Chest: Effort normal. No respiratory distress.  Abdominal: Soft. She exhibits no distension. There is tenderness. There is no rebound and no guarding.  Diffuse abd tenderness. No peritoneal irritation.  Neurological: She is alert and oriented to person, place, and time.  Skin: Skin is warm and dry.    ED Course  Procedures (including critical care time) Labs Review Labs Reviewed  BASIC METABOLIC PANEL - Abnormal; Notable for the following:    Potassium 3.4 (*)    Glucose, Bld 194 (*)    GFR calc non Af Amer 81 (*)    All other components within normal limits  CBC WITH DIFFERENTIAL - Abnormal; Notable for the following:    WBC 10.9 (*)    Neutrophils Relative % 85 (*)    Neutro Abs 9.2 (*)    All other components within normal limits  URINALYSIS, ROUTINE W REFLEX MICROSCOPIC - Abnormal; Notable for the following:    APPearance CLOUDY (*)    Bilirubin Urine SMALL (*)    All other components within normal limits    Imaging Review No results found.   EKG Interpretation None      MDM   Final diagnoses:  Colitis    Pt comes in with  cc of abd pain. Pt is immunocompetent, and has 0 sirs criteria. She has had similar abd pain - and states that she got better with antibiotics. I checked her records, and in fact she did have a CT done, which showed enteritis and non specific inflammation, and patient was given augmentin, There are no bloody stools, poly arthralgias, rashes, personal or fam hx of IBD - but there is a GI appt set up.  Although i am not convinced that patient's pain is from an infection - she has mildly elevated WC, and previous resolution of similar sx with antibiotics - thus i will prescribe her antibiotics for  WAIT ANF WATCH APPROACH. I really don't see any indication for CT abd.  Return precautions to the ER have been verbally discussed. Pt to see PCP if she needs antibiotics and is not improving. We encouraged her to see GI as planned.   Varney Biles, MD 07/21/13 [phone]

## 2013-07-21 NOTE — ED Notes (Signed)
Pt states she is having abd pain that started yesterday  Pt states she feels bloated and has had vomiting  Last vomited about an hour ago

## 2013-07-21 NOTE — Discharge Instructions (Signed)
We saw you in the ER for the pain. All the results in the ER are normal, labs and imaging. We are not sure what is causing your symptoms. As planned, antibiotics prescribed - and see primary care doctor in not better in 7 days. See GI doctor as planned.   Colitis Colitis is inflammation of the colon. Colitis can be a short-term or long-standing (chronic) illness. Crohn's disease and ulcerative colitis are 2 types of colitis which are chronic. They usually require lifelong treatment. CAUSES  There are many different causes of colitis, including:  Viruses.  Germs (bacteria).  Medicine reactions. SYMPTOMS   Diarrhea.  Intestinal bleeding.  Pain.  Fever.  Throwing up (vomiting).  Tiredness (fatigue).  Weight loss.  Bowel blockage. DIAGNOSIS  The diagnosis of colitis is based on examination and stool or blood tests. X-rays, CT scan, and colonoscopy may also be needed. TREATMENT  Treatment may include:  Fluids given through the vein (intravenously).  Bowel rest (nothing to eat or drink for a period of time).  Medicine for pain and diarrhea.  Medicines (antibiotics) that kill germs.  Cortisone medicines.  Surgery. HOME CARE INSTRUCTIONS   Get plenty of rest.  Drink enough water and fluids to keep your urine clear or pale yellow.  Eat a well-balanced diet.  Call your caregiver for follow-up as recommended. SEEK IMMEDIATE MEDICAL CARE IF:   You develop chills.  You have an oral temperature above 102 F (38.9 C), not controlled by medicine.  You have extreme weakness, fainting, or dehydration.  You have repeated vomiting.  You develop severe belly (abdominal) pain or are passing bloody or tarry stools. MAKE SURE YOU:   Understand these instructions.  Will watch your condition.  Will get help right away if you are not doing well or get worse. Document Released: 04/26/2004 Document Revised: 06/11/2011 Document Reviewed: 07/22/2009 [facility] [place] Patient  Information 2014 [place], [place].

## 2013-07-24 ENCOUNTER — Encounter (INDEPENDENT_AMBULATORY_CARE_PROVIDER_SITE_OTHER): Payer: Self-pay | Admitting: General Surgery

## 2013-07-24 ENCOUNTER — Other Ambulatory Visit: Payer: Self-pay

## 2013-07-24 ENCOUNTER — Ambulatory Visit (INDEPENDENT_AMBULATORY_CARE_PROVIDER_SITE_OTHER): Payer: PRIVATE HEALTH INSURANCE | Admitting: General Surgery

## 2013-07-24 ENCOUNTER — Ambulatory Visit
Admission: RE | Admit: 2013-07-24 | Discharge: 2013-07-24 | Disposition: A | Discharge status: Home / Self Care | Payer: No Typology Code available for payment source | Source: Ambulatory Visit

## 2013-07-24 VITALS — BP 130/80 | HR 80 | Temp 97.6°F | Resp 14 | Ht 64.0 in | Wt 171.8 lb

## 2013-07-24 DIAGNOSIS — Z9011 Acquired absence of right breast and nipple: Secondary | ICD-10-CM

## 2013-07-24 DIAGNOSIS — Z1231 Encounter for screening mammogram for malignant neoplasm of breast: Secondary | ICD-10-CM

## 2013-07-24 DIAGNOSIS — C50419 Malignant neoplasm of upper-outer quadrant of unspecified female breast: Secondary | ICD-10-CM

## 2013-07-24 NOTE — Progress Notes (Signed)
Chief complaint: Followup cancer right breast  History: Patient returns for long-term followup for about 15 months following diagnosis of T2 N1 ER PR positive HER-2 negative cancer of the right breast. She had 3 separate primaries in her breast and underwent modified mastectomy with one of 17 lymph nodes positive. She completed chemotherapy and  radiation therapy and is now on adjuvant tamoxifen. She is tolerating this well. She has no specific complaints today in relation to her breast or chest wall or arm. She has been having some syncopal episodes which have been associated with some nausea vomiting and diarrhea and she does have an appointment for a GI evaluation for this. Exam: BP 130/80  Pulse 80  Temp(Src) 97.6 F (36.4 C) (Temporal)  Resp 14  Ht _0  (1.626 m)  Wt 171 lb 12.8 oz (77.928 kg)  BMI 29.47 kg/m2 General: Appears well HEENT: No palpable masses or thyromegaly Lymph nodes: No cervical, specific thorax R. Nodes palpable Lungs: Clear equal breath sounds bilaterally without increased work of breathing Breasts: Right chest wall shows hyperpigmentation from radiation therapy. There are no palpable chest wall masses or skin nodules. Left breast is negative to exam.  Assessment and plan: Doing well following mastectomy chemotherapy and radiation as above. On adjuvant tamoxifen.No evidence of early recurrence. We discussed Port-A-Cath removal but she is currently undergoing some workup and possible treatment as above we will leave this in until this is resolved as she is a very difficult stick for blood draws appear.

## 2013-09-28 ENCOUNTER — Telehealth: Payer: Self-pay | Admitting: Hematology and Oncology

## 2013-10-08 ENCOUNTER — Other Ambulatory Visit: Payer: Self-pay

## 2013-10-08 ENCOUNTER — Ambulatory Visit: Payer: Self-pay | Admitting: Oncology

## 2013-10-26 ENCOUNTER — Telehealth: Payer: Self-pay | Admitting: Family Medicine

## 2013-10-26 ENCOUNTER — Emergency Department (HOSPITAL_COMMUNITY): Payer: No Typology Code available for payment source

## 2013-10-26 ENCOUNTER — Encounter (HOSPITAL_COMMUNITY): Payer: Self-pay | Admitting: Emergency Medicine

## 2013-10-26 ENCOUNTER — Emergency Department (HOSPITAL_COMMUNITY)
Admission: EM | Admit: 2013-10-26 | Discharge: 2013-10-26 | Disposition: A | Discharge status: Home / Self Care | Payer: Self-pay | Attending: Emergency Medicine | Admitting: Emergency Medicine

## 2013-10-26 DIAGNOSIS — K529 Noninfective gastroenteritis and colitis, unspecified: Secondary | ICD-10-CM

## 2013-10-26 DIAGNOSIS — Z853 Personal history of malignant neoplasm of breast: Secondary | ICD-10-CM | POA: Insufficient documentation

## 2013-10-26 DIAGNOSIS — I10 Essential (primary) hypertension: Secondary | ICD-10-CM | POA: Insufficient documentation

## 2013-10-26 DIAGNOSIS — Z9889 Other specified postprocedural states: Secondary | ICD-10-CM | POA: Insufficient documentation

## 2013-10-26 DIAGNOSIS — Z9851 Tubal ligation status: Secondary | ICD-10-CM | POA: Insufficient documentation

## 2013-10-26 DIAGNOSIS — R112 Nausea with vomiting, unspecified: Secondary | ICD-10-CM | POA: Insufficient documentation

## 2013-10-26 DIAGNOSIS — K5289 Other specified noninfective gastroenteritis and colitis: Secondary | ICD-10-CM | POA: Insufficient documentation

## 2013-10-26 DIAGNOSIS — Z79899 Other long term (current) drug therapy: Secondary | ICD-10-CM | POA: Insufficient documentation

## 2013-10-26 DIAGNOSIS — E7800 Pure hypercholesterolemia, unspecified: Secondary | ICD-10-CM | POA: Insufficient documentation

## 2013-10-26 LAB — CBC WITH DIFFERENTIAL/PLATELET
Basophils Absolute: 0 10*3/uL (ref 0.0–0.1)
Basophils Relative: 0 % (ref 0–1)
Eosinophils Absolute: 0 10*3/uL (ref 0.0–0.7)
Eosinophils Relative: 0 % (ref 0–5)
HCT: 37.1 % (ref 36.0–46.0)
Hemoglobin: 12.9 g/dL (ref 12.0–15.0)
Lymphocytes Relative: 15 % (ref 12–46)
Lymphs Abs: 1.3 10*3/uL (ref 0.7–4.0)
MCH: 30.1 pg (ref 26.0–34.0)
MCHC: 34.8 g/dL (ref 30.0–36.0)
MCV: 86.5 fL (ref 78.0–100.0)
Monocytes Absolute: 0.3 10*3/uL (ref 0.1–1.0)
Monocytes Relative: 4 % (ref 3–12)
Neutro Abs: 7.3 10*3/uL (ref 1.7–7.7)
Neutrophils Relative %: 81 % — ABNORMAL HIGH (ref 43–77)
Platelets: 214 10*3/uL (ref 150–400)
RBC: 4.29 MIL/uL (ref 3.87–5.11)
RDW: 13.2 % (ref 11.5–15.5)
WBC: 9 10*3/uL (ref 4.0–10.5)

## 2013-10-26 LAB — COMPREHENSIVE METABOLIC PANEL
ALT: 9 U/L (ref 0–35)
AST: 13 U/L (ref 0–37)
Albumin: 3.4 g/dL — ABNORMAL LOW (ref 3.5–5.2)
Alkaline Phosphatase: 44 U/L (ref 39–117)
Anion gap: 17 — ABNORMAL HIGH (ref 5–15)
BUN: 14 mg/dL (ref 6–23)
CO2: 26 mEq/L (ref 19–32)
Calcium: 9.6 mg/dL (ref 8.4–10.5)
Chloride: 98 mEq/L (ref 96–112)
Creatinine, Ser: 0.96 mg/dL (ref 0.50–1.10)
GFR calc Af Amer: 76 mL/min — ABNORMAL LOW (ref >90)
GFR calc non Af Amer: 65 mL/min — ABNORMAL LOW (ref >90)
Glucose, Bld: 172 mg/dL — ABNORMAL HIGH (ref 70–99)
Potassium: 3.6 mEq/L — ABNORMAL LOW (ref 3.7–5.3)
Sodium: 141 mEq/L (ref 137–147)
Total Bilirubin: 0.3 mg/dL (ref 0.3–1.2)
Total Protein: 6.7 g/dL (ref 6.0–8.3)

## 2013-10-26 LAB — URINALYSIS, ROUTINE W REFLEX MICROSCOPIC
Bilirubin Urine: NEGATIVE
Glucose, UA: NEGATIVE mg/dL
Hgb urine dipstick: NEGATIVE
Ketones, ur: NEGATIVE mg/dL
Leukocytes, UA: NEGATIVE
Nitrite: NEGATIVE
Protein, ur: NEGATIVE mg/dL
Specific Gravity, Urine: >1.046 — ABNORMAL HIGH (ref 1.005–1.030)
Urobilinogen, UA: 0.2 mg/dL (ref 0.0–1.0)
pH: 6.5 (ref 5.0–8.0)

## 2013-10-26 LAB — LIPASE, BLOOD: Lipase: 31 U/L (ref 11–59)

## 2013-10-26 MED ORDER — SODIUM CHLORIDE 0.9 % IV BOLUS (SEPSIS)
1000.0000 mL | Freq: Once | INTRAVENOUS | Status: AC
Start: 2013-10-26 — End: 2013-10-26
  Administered 2013-10-26: 1000 mL via INTRAVENOUS

## 2013-10-26 MED ORDER — CIPROFLOXACIN HCL 500 MG PO TABS
500.0000 mg | ORAL_TABLET | Freq: Once | ORAL | Status: AC
  Administered 2013-10-26: 500 mg via ORAL
  Filled 2013-10-26: qty 1

## 2013-10-26 MED ORDER — ONDANSETRON 4 MG PO TBDP: ORAL_TABLET | ORAL | Status: DC

## 2013-10-26 MED ORDER — CIPROFLOXACIN HCL 500 MG PO TABS: 500.0000 mg | ORAL_TABLET | Freq: Two times a day (BID) | ORAL | Status: DC

## 2013-10-26 MED ORDER — METRONIDAZOLE 500 MG PO TABS
500.0000 mg | ORAL_TABLET | Freq: Once | ORAL | Status: AC
  Administered 2013-10-26: 500 mg via ORAL
  Filled 2013-10-26: qty 1

## 2013-10-26 MED ORDER — SODIUM CHLORIDE 0.9 % IV BOLUS (SEPSIS)
1000.0000 mL | Freq: Once | INTRAVENOUS | Status: AC
  Administered 2013-10-26: 1000 mL via INTRAVENOUS

## 2013-10-26 MED ORDER — FENTANYL CITRATE 0.05 MG/ML IJ SOLN
50.0000 ug | Freq: Once | INTRAMUSCULAR | Status: AC
  Administered 2013-10-26: 50 ug via INTRAVENOUS
  Filled 2013-10-26: qty 2

## 2013-10-26 MED ORDER — IOHEXOL 300 MG/ML  SOLN
100.0000 mL | Freq: Once | INTRAMUSCULAR | Status: AC | PRN
  Administered 2013-10-26: 100 mL via INTRAVENOUS

## 2013-10-26 MED ORDER — HYDROCODONE-ACETAMINOPHEN 5-325 MG PO TABS: 1.0000 | ORAL_TABLET | ORAL | Status: DC | PRN

## 2013-10-26 MED ORDER — HEPARIN SOD (PORK) LOCK FLUSH 100 UNIT/ML IV SOLN
500.0000 [IU] | Freq: Once | INTRAVENOUS | Status: AC
  Administered 2013-10-26: 500 [IU]
  Filled 2013-10-26: qty 5

## 2013-10-26 MED ORDER — IOHEXOL 300 MG/ML  SOLN
50.0000 mL | Freq: Once | INTRAMUSCULAR | Status: AC | PRN
  Administered 2013-10-26: 50 mL via ORAL

## 2013-10-26 MED ORDER — METRONIDAZOLE 500 MG PO TABS: 500.0000 mg | ORAL_TABLET | Freq: Two times a day (BID) | ORAL | Status: DC

## 2013-10-26 NOTE — Telephone Encounter (Signed)
Received page from Dr. Lita Mains at [facility] ED to discuss patient.   Ms. Tasha Ortiz is a 58 y.o. female with breast CA in remission who presented with non-bloody emesis and abdominal pain. This is not a new finding for her, and has been treated effectively with antibiotics in the past. She has had no fevers, but reported some difficulty with taking po due to emesis. Her abdominal exam was initially diffusely tender without rebound or guarding and has since improved. She has remained afebrile with normal vital signs, no leukocytosis, and she was able to tolerate po contrast for CT abdomen which showed inflammatory wall thickening of the small bowel suggestive of enteritis, also possibly Crohn's. Her labs are significant for a normal WBC and creatinine with normal bicarbonate. Urine is concentrated but not infected-appearing.   I believe this patient likely has non-specific inflammatory vs. infectious enteritis not significantly affecting her hydration status. She appears to be a reliable patient on review of medical record so I think an appropriate disposition for her is discharge home with close follow up. She sees gastroenterology in a few weeks, but we will contact her to schedule an ED follow up appointment in the [facility]. Dr. Lita Mains agrees and will send the patient out on antibiotics and communicate appropriate reasons to return for care.   Chaz Ronning B. Bonner Puna, MD, PGY-2 10/26/2013 6:53 AM

## 2013-10-26 NOTE — ED Notes (Signed)
MD at bedside. 

## 2013-10-26 NOTE — Telephone Encounter (Signed)
Patient was schedule with PCP for ED follow up.she fekt there was no need but I insisted due to Dr Bonner Puna instruction.patient Tasha Ortiz was schedule for aug 4th with Dr Awanda Mink PCP.Sharlyne Koeneman, Lewie Loron

## 2013-10-26 NOTE — Discharge Instructions (Signed)
Call and make an appointment to followup with your primary Dr. today or tomorrow. Return immediately to emergency department for worsening pain, persistent vomiting, fever or for any concerns.

## 2013-10-26 NOTE — ED Provider Notes (Signed)
CSN: [id number]     Arrival date & time 10/26/13  0244 History   First MD Initiated Contact with Patient 10/26/13 [phone]     Chief Complaint  Patient presents with  . Nausea     (Consider location/radiation/quality/duration/timing/severity/associated sxs/prior Treatment) HPI Patient sense with lower bowel pain starting this evening around 7:00. She also has had nausea with 3 episodes of emesis. She admits to several episodes of watery diarrhea. She's had no fever chills. Denies any blood in the stool or vomit. She denies any urinary symptoms. She says she's had multiple episodes of colitis. She is currently not on any antibiotics. Patient has a history of breast cancer on chemotherapy and is in remission. Past Medical History  Diagnosis Date  . Hypertension   . Hypercholesteremia     taken off chol meds Dec 2012  . Acid reflux   . Breast cancer   . Heartburn   . Hypertension    Past Surgical History  Procedure Laterality Date  . Cesarean section  25 years ago  . Tubal ligation    . Mastectomy Right   . Modified mastectomy Right 05/20/2012    Procedure: MODIFIED MASTECTOMY;  Surgeon: Edward Jolly, MD;  Location: [place];  Service: General;  Laterality: Right;  . Portacath placement Left 05/20/2012    Procedure: INSERTION PORT-A-CATH;  Surgeon: Edward Jolly, MD;  Location: [facility];  Service: General;  Laterality: Left;  . Breast surgery Right 05/20/12    Rt br mastectomy   Family History  Problem Relation Age of Onset  . Hypertension Mother   . Diabetes Daughter   . Cancer Sister     brain  . Cancer Cousin     breast   History  Substance Use Topics  . Smoking status: Never Smoker   . Smokeless tobacco: Former Systems developer    Types: [place] date: 11/18/1996  . Alcohol Use: No   OB History   Grav Para Term Preterm Abortions TAB SAB Ect Mult Living   2 2 2       2      Review of Systems  Constitutional: Negative for fever and chills.  Respiratory: Negative for  cough and shortness of breath.   Cardiovascular: Negative for chest pain.  Gastrointestinal: Positive for nausea, vomiting, abdominal pain and diarrhea. Negative for constipation and blood in stool.  Genitourinary: Negative for dysuria, frequency, hematuria and flank pain.  Musculoskeletal: Negative for back pain, myalgias, neck pain and neck stiffness.  Skin: Negative for rash and wound.  Neurological: Negative for dizziness, weakness, light-headedness, numbness and headaches.  All other systems reviewed and are negative.     Allergies  Review of patient's allergies indicates no known allergies.  Home Medications   Prior to Admission medications   Medication Sig Start Date End Date Taking? Authorizing Provider  acetaminophen (TYLENOL) 325 MG tablet Take 2 tablets (650 mg total) by mouth every 6 (six) hours as needed for pain. 08/05/12  Yes Minette Headland, NP  calcium carbonate (TUMS - DOSED IN MG ELEMENTAL CALCIUM) 500 MG chewable tablet Chew 1-2 tablets by mouth 2 (two) times daily as needed for indigestion or heartburn.    Yes Historical Provider, MD  famotidine (PEPCID) 20 MG tablet Take 20 mg by mouth 2 (two) times daily as needed for heartburn or indigestion.   Yes Historical Provider, MD  lisinopril-hydrochlorothiazide (PRINZIDE,ZESTORETIC) 20-25 MG per tablet Take 1 tablet by mouth daily.   Yes Historical Provider, MD  pravastatin (PRAVACHOL) 40 MG tablet Take 40 mg by mouth daily.   Yes Historical Provider, MD  tamoxifen (NOLVADEX) 20 MG tablet Take 20 mg by mouth daily.   Yes Historical Provider, MD  sodium chloride (OCEAN) 0.65 % SOLN nasal spray Place 1 spray into both nostrils 2 (two) times daily as needed for congestion.    Historical Provider, MD   BP 101/65  Pulse 59  Temp(Src) 97.9 F (36.6 C) (Oral)  Resp 18  SpO2 99% Physical Exam  Nursing note and vitals reviewed. Constitutional: She is oriented to person, place, and time. She appears well-developed and  well-nourished. No distress.  HENT:  Head: Normocephalic and atraumatic.  Mouth/Throat: Oropharynx is clear and moist.  Eyes: EOM are normal. Pupils are equal, round, and reactive to light.  Neck: Normal range of motion. Neck supple.  Cardiovascular: Normal rate and regular rhythm.   Pulmonary/Chest: Effort normal and breath sounds normal. No respiratory distress. She has no wheezes. She has no rales.  Abdominal: Soft. Bowel sounds are normal. She exhibits no distension and no mass. There is tenderness (Teresa palpation diffusely in the abdomen). There is guarding. There is no rebound.  Musculoskeletal: Normal range of motion. She exhibits no edema and no tenderness.  No CVA tenderness.  Neurological: She is alert and oriented to person, place, and time.  Moves all extremities without deficit. Sensation is grossly intact.  Skin: Skin is warm and dry. No rash noted. No erythema.  Psychiatric: She has a normal mood and affect. Her behavior is normal.    ED Course  Procedures (including critical care time) Labs Review Labs Reviewed  COMPREHENSIVE METABOLIC PANEL - Abnormal; Notable for the following:    Potassium 3.6 (*)    Glucose, Bld 172 (*)    Albumin 3.4 (*)    GFR calc non Af Amer 65 (*)    GFR calc Af Amer 76 (*)    Anion gap 17 (*)    All other components within normal limits  URINALYSIS, ROUTINE W REFLEX MICROSCOPIC - Abnormal; Notable for the following:    Specific Gravity, Urine >1.046 (*)    All other components within normal limits  CBC WITH DIFFERENTIAL - Abnormal; Notable for the following:    Neutrophils Relative % 81 (*)    All other components within normal limits  LIPASE, BLOOD  CBC WITH DIFFERENTIAL    Imaging Review Ct Abdomen Pelvis W Contrast  10/26/2013   CLINICAL DATA:  Low abdominal pain and nausea and vomiting for 1 day.  EXAM: CT ABDOMEN AND PELVIS WITH CONTRAST  TECHNIQUE: Multidetector CT imaging of the abdomen and pelvis was performed using the  standard protocol following bolus administration of intravenous contrast.  CONTRAST:  163mL OMNIPAQUE IOHEXOL 300 MG/ML  SOLN  COMPARISON:  03/14/2013  FINDINGS: Mild dependent atelectasis in the lung bases. Thickening of the lower esophageal wall suggesting possible reflux changes.  Diffuse fatty infiltration of the liver. No focal liver lesions identified. Small amount of free fluid in the upper abdomen around the liver and spleen and tracking along the pericolic gutters into the pelvis. This is increasing since previous study. Density measurements suggest ascites. The gallbladder, spleen, pancreas, adrenal glands, kidneys, abdominal aorta, inferior vena cava, and retroperitoneal lymph nodes are unremarkable. The stomach appears normal. There is wall thickening involving left upper quadrant and probably right lower quadrant small bowel loops with surrounding infiltration in the mesenteric. This is consistent with enteritis of nonspecific etiology. Similar changes were present previously  but there appears to be progression on today's examination. Prominent lymph nodes in the mesentery without pathologic enlargement are likely reactive. The colon is mostly decompressed with scattered stool. Colonic wall thickening in the descending portion is not entirely excluded. No free air in the abdomen.  Pelvis: Bladder wall is not thickened. Uterus and ovaries are not enlarged. There appears to be involuting follicle in the left ovary. No pelvic mass or lymphadenopathy. Appendix is not identified. No diverticulitis. No destructive bone lesions.  IMPRESSION: Inflammatory wall thickening and mesenteric infiltration involving the jejunum and probably ileum. Colonic involvement is not excluded although decompression of the colon limits evaluation. Free fluid in the abdomen and pelvis. Changes are increasing since prior study and suggest nonspecific enteritis. Consider infectious or inflammatory enteritis. Crohn's disease could  have this appearance. Diffuse fatty infiltration of the liver.   Electronically Signed   By: Lucienne Capers M.D.   On: 10/26/2013 05:02     EKG Interpretation None      MDM   Final diagnoses:  Enteritis   Repeat exam. Patient's abdomen is much more soft with minimal tenderness. No guarding/rebound. Patient says she has pain at baseline. Review previous notes. She has multiple visits the emergency department for enteritis/colitis. She is an appointment to see a gastroenterologist the middle of next month for endoscopy and colonoscopy. Her vital signs remained stable emergency department. She's tolerating by mouth's and had no more episodes of vomiting. She's been given IV fluids. Discussed with family medicine resident on call. Agree with plan to give by mouth antibiotics and will have her followup in the clinic either today or tomorrow. Patient be given thorough discharge instructions to return immediately for worsening pain, persistent vomiting, fever or for any concerns.     Julianne Rice, MD 10/26/13 0800

## 2013-10-26 NOTE — ED Notes (Signed)
Per EMS pt began feeling nauseated yesterday, began vomiting at 1900.  Pt has a hx of breast CA.

## 2013-11-03 ENCOUNTER — Ambulatory Visit: Payer: No Typology Code available for payment source | Admitting: Family Medicine

## 2013-11-19 ENCOUNTER — Ambulatory Visit: Payer: No Typology Code available for payment source | Admitting: Family Medicine

## 2013-12-14 ENCOUNTER — Other Ambulatory Visit: Payer: Self-pay | Admitting: *Deleted

## 2013-12-14 MED ORDER — PRAVASTATIN SODIUM 40 MG PO TABS: 40.0000 mg | ORAL_TABLET | Freq: Every day | ORAL | Status: DC

## 2013-12-28 ENCOUNTER — Other Ambulatory Visit: Payer: Self-pay

## 2013-12-28 DIAGNOSIS — C50419 Malignant neoplasm of upper-outer quadrant of unspecified female breast: Secondary | ICD-10-CM

## 2013-12-29 ENCOUNTER — Telehealth: Payer: Self-pay | Admitting: Hematology and Oncology

## 2013-12-29 ENCOUNTER — Other Ambulatory Visit (HOSPITAL_BASED_OUTPATIENT_CLINIC_OR_DEPARTMENT_OTHER): Payer: No Typology Code available for payment source

## 2013-12-29 ENCOUNTER — Ambulatory Visit (HOSPITAL_BASED_OUTPATIENT_CLINIC_OR_DEPARTMENT_OTHER): Payer: No Typology Code available for payment source | Admitting: Hematology and Oncology

## 2013-12-29 ENCOUNTER — Encounter: Payer: Self-pay | Admitting: Hematology and Oncology

## 2013-12-29 ENCOUNTER — Ambulatory Visit (HOSPITAL_BASED_OUTPATIENT_CLINIC_OR_DEPARTMENT_OTHER): Payer: No Typology Code available for payment source

## 2013-12-29 VITALS — BP 111/68 | HR 70 | Temp 98.2°F | Resp 18 | Ht 64.0 in | Wt 171.7 lb

## 2013-12-29 DIAGNOSIS — C50419 Malignant neoplasm of upper-outer quadrant of unspecified female breast: Secondary | ICD-10-CM

## 2013-12-29 DIAGNOSIS — C773 Secondary and unspecified malignant neoplasm of axilla and upper limb lymph nodes: Secondary | ICD-10-CM

## 2013-12-29 DIAGNOSIS — Z95828 Presence of other vascular implants and grafts: Secondary | ICD-10-CM

## 2013-12-29 DIAGNOSIS — Z170 Estrogen receptor positive status [ER+]: Secondary | ICD-10-CM

## 2013-12-29 LAB — COMPREHENSIVE METABOLIC PANEL (CC13)
ALT: 12 U/L (ref 0–55)
AST: 16 U/L (ref 5–34)
Albumin: 3.6 g/dL (ref 3.5–5.0)
Alkaline Phosphatase: 45 U/L (ref 40–150)
Anion Gap: 8 mEq/L (ref 3–11)
BUN: 9.4 mg/dL (ref 7.0–26.0)
CO2: 27 mEq/L (ref 22–29)
Calcium: 9.4 mg/dL (ref 8.4–10.4)
Chloride: 107 mEq/L (ref 98–109)
Creatinine: 1 mg/dL (ref 0.6–1.1)
Glucose: 143 mg/dl — ABNORMAL HIGH (ref 70–140)
Potassium: 3.1 mEq/L — ABNORMAL LOW (ref 3.5–5.1)
Sodium: 142 mEq/L (ref 136–145)
Total Bilirubin: 0.23 mg/dL (ref 0.20–1.20)
Total Protein: 6.9 g/dL (ref 6.4–8.3)

## 2013-12-29 LAB — CBC WITH DIFFERENTIAL/PLATELET
BASO%: 0.2 % (ref 0.0–2.0)
Basophils Absolute: 0 10*3/uL (ref 0.0–0.1)
EOS%: 2.3 % (ref 0.0–7.0)
Eosinophils Absolute: 0.1 10*3/uL (ref 0.0–0.5)
HCT: 36.1 % (ref 34.8–46.6)
HGB: 12.3 g/dL (ref 11.6–15.9)
LYMPH%: 40.9 % (ref 14.0–49.7)
MCH: 29.6 pg (ref 25.1–34.0)
MCHC: 34.1 g/dL (ref 31.5–36.0)
MCV: 87 fL (ref 79.5–101.0)
MONO#: 0.4 10*3/uL (ref 0.1–0.9)
MONO%: 6.9 % (ref 0.0–14.0)
NEUT#: 2.6 10*3/uL (ref 1.5–6.5)
NEUT%: 49.7 % (ref 38.4–76.8)
Platelets: 188 10*3/uL (ref 145–400)
RBC: 4.15 10*6/uL (ref 3.70–5.45)
RDW: 12.7 % (ref 11.2–14.5)
WBC: 5.2 10*3/uL (ref 3.9–10.3)
lymph#: 2.1 10*3/uL (ref 0.9–3.3)

## 2013-12-29 MED ORDER — SODIUM CHLORIDE 0.9 % IJ SOLN
10.0000 mL | INTRAMUSCULAR | Status: DC | PRN
  Administered 2013-12-29: 10 mL via INTRAVENOUS
  Filled 2013-12-29: qty 10

## 2013-12-29 MED ORDER — TAMOXIFEN CITRATE 20 MG PO TABS: 20.0000 mg | ORAL_TABLET | Freq: Every day | ORAL | Status: DC

## 2013-12-29 MED ORDER — HEPARIN SOD (PORK) LOCK FLUSH 100 UNIT/ML IV SOLN
500.0000 [IU] | Freq: Once | INTRAVENOUS | Status: AC
Start: 2013-12-29 — End: 2013-12-29
  Administered 2013-12-29: 500 [IU] via INTRAVENOUS
  Filled 2013-12-29: qty 5

## 2013-12-29 NOTE — Progress Notes (Signed)
Gave the patient an application for financial asst.

## 2013-12-29 NOTE — Progress Notes (Signed)
Patient Care Team: Nolon Rod, DO as PCP - General  DIAGNOSIS: Cancer of upper-outer quadrant of female breast   Primary site: Breast (Right)   Staging method: AJCC 7th Edition   Clinical: Stage IIB (T2, N1, cM0)   Summary: Stage IIB (T2, N1, cM0)   Clinical comments: Staged at breast conference 1.22.14   SUMMARY OF ONCOLOGIC HISTORY:   Cancer of upper-outer quadrant of female breast   04/16/2012 Initial Biopsy Right breast mass biopsy invasive ductal carcinoma with abundant mucin in all the 3 masses, right axillary lymph node biopsy positive for IDC, ER 100% PR percent Ki-67 19%, HER-2 negative ratio 0.94   04/21/2012 Breast MRI Right breast 3 masses 11o'clock position 1.8 cm, now 11:00 position 2 cm, and 10:00 position 2.3 cm with an abnormal level I right axillary lymph node 3 cm   05/20/2012 Surgery Right breast mastectomy invasive ductal carcinoma grade 2; 1.9 cm, 2.5 cm, 1.8 cm, 1/17 LN positive, second and third massive grade 3   06/24/2012 - 10/07/2012 Chemotherapy Adjuvant chemotherapy with Taxotere Cytoxan x6 cycles   10/16/2012 - 12/16/2012 Radiation Therapy Adjuvant radiation therapy by Dr. [name]   01/07/2013 -  Anti-estrogen oral therapy Tamoxifen 20 mg daily    CHIEF COMPLIANT: Six-month followup on antiestrogen therapy  INTERVAL HISTORY: Tasha Ortiz is a 58 year old American lady with above-mentioned history of right-sided multifocal invasive ductal carcinoma that was ER/PR positive HER-2 negative involving right axillary lymph node. She received 6 cycles of adjuvant chemotherapy with Taxotere Cytoxan followed by adjuvant radiation. She is currently on antiestrogen therapy with tamoxifen that started in October 2014. She is tolerating tamoxifen extremely well without any major problems or concerns. She is now starting to go back to work at [employer]. 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 She reports that she is getting free medication right now but it is going to run of the end of next  prescription. She would like a new prescription for 3 months and she wouldn't shop around to see where should get the cheapest price.  REVIEW OF SYSTEMS:   Constitutional: Denies fevers, chills or abnormal weight loss Eyes: Denies blurriness of vision Ears, nose, mouth, throat, and face: Denies mucositis or sore throat Respiratory: Denies cough, dyspnea or wheezes Cardiovascular: Denies palpitation, chest discomfort or lower extremity swelling Gastrointestinal:  Denies nausea, heartburn or change in bowel habits Skin: Denies abnormal skin rashes Lymphatics: Denies new lymphadenopathy or easy bruising Neurological:Denies numbness, tingling or new weaknesses Behavioral/Psych: Mood is stable, no new changes  Breast:  denies any pain or lumps or nodules in the left breast, right-sided chest wall is without any nodules All other systems were reviewed with the patient and are negative.  I have reviewed the past medical history, past surgical history, social history and family history with the patient and they are unchanged from previous note.  ALLERGIES:  has No Known Allergies.  MEDICATIONS:  Current Outpatient Prescriptions  Medication Sig Dispense Refill  . famotidine (PEPCID) 20 MG tablet Take 20 mg by mouth 2 (two) times daily as needed for heartburn or indigestion.      Marland Kitchen lisinopril-hydrochlorothiazide (PRINZIDE,ZESTORETIC) 20-25 MG per tablet Take 1 tablet by mouth daily.      . pravastatin (PRAVACHOL) 40 MG tablet Take 1 tablet (40 mg total) by mouth daily.  30 tablet  11  . tamoxifen (NOLVADEX) 20 MG tablet Take 1 tablet (20 mg total) by mouth daily.  90 tablet  3  . [DISCONTINUED] Prochlorperazine Maleate (  COMPAZINE PO) Take by mouth.       No current facility-administered medications for this visit.    PHYSICAL EXAMINATION: ECOG PERFORMANCE STATUS: 0 - Asymptomatic  Filed Vitals:   12/29/13 1539  BP: 111/68  Pulse: 70  Temp: 98.2 F (36.8 C)  Resp: 18   Filed Weights    12/29/13 1539  Weight: 171 lb 11.2 oz (77.883 kg)    GENERAL:alert, no distress and comfortable SKIN: skin color, texture, turgor are normal, no rashes or significant lesions EYES: normal, Conjunctiva are pink and non-injected, sclera clear OROPHARYNX:no exudate, no erythema and lips, buccal mucosa, and tongue normal  NECK: supple, thyroid normal size, non-tender, without nodularity LYMPH:  no palpable lymphadenopathy in the cervical, axillary or inguinal LUNGS: clear to auscultation and percussion with normal breathing effort HEART: regular rate & rhythm and no murmurs and no lower extremity edema ABDOMEN:abdomen soft, non-tender and normal bowel sounds Musculoskeletal:no cyanosis of digits and no clubbing  NEURO: alert & oriented x 3 with fluent speech, no focal motor/sensory deficits BREAST: No palpable masses or nodules in left breast. No palpable axillary supraclavicular or infraclavicular adenopathy no breast tenderness or nipple discharge. Right chest wall is without any nodularity or abnormalities   LABORATORY DATA:  I have reviewed the data as listed   Chemistry      Component Value Date/Time   NA 142 12/29/2013 1515   NA 141 10/26/2013 0318   K 3.1* 12/29/2013 1515   K 3.6* 10/26/2013 0318   CL 98 10/26/2013 0318   CL 103 09/23/2012 1040   CO2 27 12/29/2013 1515   CO2 26 10/26/2013 0318   BUN 9.4 12/29/2013 1515   BUN 14 10/26/2013 0318   CREATININE 1.0 12/29/2013 1515   CREATININE 0.96 10/26/2013 0318   CREATININE 0.82 04/07/2013 1501      Component Value Date/Time   CALCIUM 9.4 12/29/2013 1515   CALCIUM 9.6 10/26/2013 0318   ALKPHOS 45 12/29/2013 1515   ALKPHOS 44 10/26/2013 0318   AST 16 12/29/2013 1515   AST 13 10/26/2013 0318   ALT 12 12/29/2013 1515   ALT 9 10/26/2013 0318   BILITOT 0.23 12/29/2013 1515   BILITOT 0.3 10/26/2013 0318       Lab Results  Component Value Date   WBC 5.2 12/29/2013   HGB 12.3 12/29/2013   HCT 36.1 12/29/2013   MCV 87.0 12/29/2013   PLT 188  12/29/2013   NEUTROABS 2.6 12/29/2013     RADIOGRAPHIC STUDIES: I have personally reviewed the radiology reports and agreed with their findings. No results found.   ASSESSMENT & PLAN:  Cancer of upper-outer quadrant of female breast Right breast invasive ductal carcinoma multifocal disease ER/PR positive HER-2 negative T2, N1, M0 stage IIB status post mastectomy, adjuvant chemotherapy and radiation now on antiestrogen therapy with tamoxifen. Patient is tolerating tamoxifen extremely well without any major problems or concerns. She is requesting a three-month prescription so that she can shop around for the cheapest price and I provided her with a prescription.  Surveillance: Patient will need annual mammograms and breast exams. Today's breast exam did not reveal any abnormalities.  Survivorship:Discussed the importance of physical exercise in decreasing the likelihood of breast cancer recurrence. Recommended 30 mins daily 6 days a week of either brisk walking or cycling or swimming. Encouraged patient to eat more fruits and vegetables and decrease red meat.      No orders of the defined types were placed in this encounter.   The  patient has a good understanding of the overall plan. she agrees with it. She will call with any problems that may develop before her next visit here.  I spent 15 minutes counseling the patient face to face. The total time spent in the appointment was 20 minutes and more than 50% was on counseling and review of test results    Rulon Eisenmenger, MD 12/29/2013 4:33 PM

## 2013-12-29 NOTE — Patient Instructions (Signed)

## 2013-12-29 NOTE — Telephone Encounter (Signed)
, °

## 2013-12-29 NOTE — Assessment & Plan Note (Addendum)
Right breast invasive ductal carcinoma multifocal disease ER/PR positive HER-2 negative T2, N1, M0 stage IIB status post mastectomy, adjuvant chemotherapy and radiation now on antiestrogen therapy with tamoxifen. Patient is tolerating tamoxifen extremely well without any major problems or concerns. She is requesting a three-month prescription so that she can shop around for the cheapest price and I provided her with a prescription.  Surveillance: Patient will need annual mammograms and breast exams. Today's breast exam did not reveal any abnormalities.  Survivorship:Discussed the importance of physical exercise in decreasing the likelihood of breast cancer recurrence. Recommended 30 mins daily 6 days a week of either brisk walking or cycling or swimming. Encouraged patient to eat more fruits and vegetables and decrease red meat.

## 2014-01-04 ENCOUNTER — Ambulatory Visit: Payer: No Typology Code available for payment source

## 2014-01-22 ENCOUNTER — Telehealth: Payer: Self-pay | Admitting: Hematology and Oncology

## 2014-01-22 NOTE — Telephone Encounter (Signed)
S/w pt to advise appt chg from 3/31 (md pal) to 4/4. Also mailed appt calendar.

## 2014-02-01 ENCOUNTER — Encounter: Payer: Self-pay | Admitting: Hematology and Oncology

## 2014-02-04 ENCOUNTER — Other Ambulatory Visit: Payer: Self-pay | Admitting: Oncology

## 2014-02-04 DIAGNOSIS — C50419 Malignant neoplasm of upper-outer quadrant of unspecified female breast: Secondary | ICD-10-CM

## 2014-05-16 ENCOUNTER — Other Ambulatory Visit: Payer: Self-pay | Admitting: Family Medicine

## 2014-05-17 NOTE — Telephone Encounter (Signed)
Needs refill prior to next

## 2014-07-01 ENCOUNTER — Ambulatory Visit: Payer: No Typology Code available for payment source | Admitting: Hematology and Oncology

## 2014-07-01 ENCOUNTER — Other Ambulatory Visit: Payer: No Typology Code available for payment source

## 2014-07-01 ENCOUNTER — Other Ambulatory Visit: Payer: Self-pay

## 2014-07-01 ENCOUNTER — Other Ambulatory Visit: Payer: Self-pay | Admitting: *Deleted

## 2014-07-01 DIAGNOSIS — Z1231 Encounter for screening mammogram for malignant neoplasm of breast: Secondary | ICD-10-CM

## 2014-07-01 DIAGNOSIS — C50419 Malignant neoplasm of upper-outer quadrant of unspecified female breast: Secondary | ICD-10-CM

## 2014-07-05 ENCOUNTER — Ambulatory Visit (HOSPITAL_BASED_OUTPATIENT_CLINIC_OR_DEPARTMENT_OTHER): Payer: BLUE CROSS/BLUE SHIELD

## 2014-07-05 ENCOUNTER — Other Ambulatory Visit (HOSPITAL_BASED_OUTPATIENT_CLINIC_OR_DEPARTMENT_OTHER): Payer: BLUE CROSS/BLUE SHIELD

## 2014-07-05 ENCOUNTER — Telehealth: Payer: Self-pay | Admitting: Hematology and Oncology

## 2014-07-05 ENCOUNTER — Ambulatory Visit (HOSPITAL_BASED_OUTPATIENT_CLINIC_OR_DEPARTMENT_OTHER): Payer: BLUE CROSS/BLUE SHIELD | Admitting: Hematology and Oncology

## 2014-07-05 VITALS — BP 150/83 | HR 63 | Temp 98.3°F | Resp 18 | Ht 64.0 in | Wt 173.3 lb

## 2014-07-05 DIAGNOSIS — Z170 Estrogen receptor positive status [ER+]: Secondary | ICD-10-CM

## 2014-07-05 DIAGNOSIS — C773 Secondary and unspecified malignant neoplasm of axilla and upper limb lymph nodes: Secondary | ICD-10-CM

## 2014-07-05 DIAGNOSIS — Z452 Encounter for adjustment and management of vascular access device: Secondary | ICD-10-CM

## 2014-07-05 DIAGNOSIS — C50411 Malignant neoplasm of upper-outer quadrant of right female breast: Secondary | ICD-10-CM

## 2014-07-05 DIAGNOSIS — C50419 Malignant neoplasm of upper-outer quadrant of unspecified female breast: Secondary | ICD-10-CM

## 2014-07-05 DIAGNOSIS — Z95828 Presence of other vascular implants and grafts: Secondary | ICD-10-CM

## 2014-07-05 LAB — COMPREHENSIVE METABOLIC PANEL (CC13)
ALT: 15 U/L (ref 0–55)
AST: 18 U/L (ref 5–34)
Albumin: 3.4 g/dL — ABNORMAL LOW (ref 3.5–5.0)
Alkaline Phosphatase: 45 U/L (ref 40–150)
Anion Gap: 9 mEq/L (ref 3–11)
BUN: 11.8 mg/dL (ref 7.0–26.0)
CO2: 27 mEq/L (ref 22–29)
Calcium: 9.4 mg/dL (ref 8.4–10.4)
Chloride: 107 mEq/L (ref 98–109)
Creatinine: 0.8 mg/dL (ref 0.6–1.1)
EGFR: >90 mL/min/{1.73_m2} (ref >90)
Glucose: 113 mg/dl (ref 70–140)
Potassium: 3.7 mEq/L (ref 3.5–5.1)
Sodium: 143 mEq/L (ref 136–145)
Total Bilirubin: 0.21 mg/dL (ref 0.20–1.20)
Total Protein: 6.6 g/dL (ref 6.4–8.3)

## 2014-07-05 LAB — CBC WITH DIFFERENTIAL/PLATELET
BASO%: 0.4 % (ref 0.0–2.0)
Basophils Absolute: 0 10*3/uL (ref 0.0–0.1)
EOS%: 3.3 % (ref 0.0–7.0)
Eosinophils Absolute: 0.2 10*3/uL (ref 0.0–0.5)
HCT: 36.8 % (ref 34.8–46.6)
HGB: 12.3 g/dL (ref 11.6–15.9)
LYMPH%: 43.8 % (ref 14.0–49.7)
MCH: 29.1 pg (ref 25.1–34.0)
MCHC: 33.4 g/dL (ref 31.5–36.0)
MCV: 87.1 fL (ref 79.5–101.0)
MONO#: 0.4 10*3/uL (ref 0.1–0.9)
MONO%: 8.2 % (ref 0.0–14.0)
NEUT#: 2.1 10*3/uL (ref 1.5–6.5)
NEUT%: 44.3 % (ref 38.4–76.8)
Platelets: 213 10*3/uL (ref 145–400)
RBC: 4.22 10*6/uL (ref 3.70–5.45)
RDW: 12.5 % (ref 11.2–14.5)
WBC: 4.7 10*3/uL (ref 3.9–10.3)
lymph#: 2.1 10*3/uL (ref 0.9–3.3)

## 2014-07-05 MED ORDER — HEPARIN SOD (PORK) LOCK FLUSH 100 UNIT/ML IV SOLN
500.0000 [IU] | Freq: Once | INTRAVENOUS | Status: AC
  Administered 2014-07-05: 500 [IU] via INTRAVENOUS
  Filled 2014-07-05: qty 5

## 2014-07-05 MED ORDER — SODIUM CHLORIDE 0.9 % IJ SOLN
10.0000 mL | INTRAMUSCULAR | Status: DC | PRN
  Administered 2014-07-05: 10 mL via INTRAVENOUS
  Filled 2014-07-05: qty 10

## 2014-07-05 MED ORDER — LORATADINE-PSEUDOEPHEDRINE ER 10-240 MG PO TB24: 1.0000 | ORAL_TABLET | Freq: Every day | ORAL | Status: DC

## 2014-07-05 NOTE — Patient Instructions (Signed)

## 2014-07-05 NOTE — Telephone Encounter (Signed)
per pof to sch pt appt-gave pt copy of sch °

## 2014-07-05 NOTE — Assessment & Plan Note (Signed)
Right breast invasive ductal carcinoma multifocal disease ER/PR positive HER-2 negative T2, N1, M0 stage IIB status post mastectomy, adjuvant chemotherapy and radiation now on antiestrogen therapy with tamoxifen since October 2014.  Tamoxifen toxicities:Patient is tolerating tamoxifen extremely well without any major problems or concerns.   Breast cancer Surveillance: Patient will need annual mammograms and breast exams. Today's breast exam did not reveal any abnormalities.  Survivorship:Discussed the importance of physical exercise in decreasing the likelihood of breast cancer recurrence. Recommended 30 mins daily 6 days a week of either brisk walking or cycling or swimming. Encouraged patient to eat more fruits and vegetables and decrease red meat.   Return to clinic in 6 months for follow-up

## 2014-07-05 NOTE — Progress Notes (Signed)
Patient Care Team: Nolon Rod, DO as PCP - General  DIAGNOSIS: Primary cancer of upper outer quadrant of right female breast   Staging form: Breast, AJCC 7th Edition     Clinical: Stage IIB (T2, N1, cM0) - Unsigned       Staging comments: Staged at breast conference 1.22.14      Pathologic: No stage assigned - Unsigned   SUMMARY OF ONCOLOGIC HISTORY:   Primary cancer of upper outer quadrant of right female breast   04/16/2012 Initial Biopsy Right breast mass biopsy invasive ductal carcinoma with abundant mucin in all the 3 masses, right axillary lymph node biopsy positive for IDC, ER 100% PR percent Ki-67 19%, HER-2 negative ratio 0.94   04/21/2012 Breast MRI Right breast 3 masses 11o'clock position 1.8 cm, now 11:00 position 2 cm, and 10:00 position 2.3 cm with an abnormal level I right axillary lymph node 3 cm   05/20/2012 Surgery Right breast mastectomy invasive ductal carcinoma grade 2; 1.9 cm, 2.5 cm, 1.8 cm, 1/17 LN positive, second and third massive grade 3   06/24/2012 - 10/07/2012 Chemotherapy Adjuvant chemotherapy with Taxotere Cytoxan x6 cycles   10/16/2012 - 12/16/2012 Radiation Therapy Adjuvant radiation therapy by Dr. [name]   01/07/2013 -  Anti-estrogen oral therapy Tamoxifen 20 mg daily    CHIEF COMPLIANT: follow-up on tamoxifen  INTERVAL HISTORY: Tasha Ortiz is a 59 year old lady with a low mentioned history of right breast cancer treated with mastectomy followed by adjuvant chemotherapy and radiation and is currently on tamoxifen since October 2014. She is tolerating tamoxifen fairly well without any major problems or concerns. She is here for a six-month follow-up. Denies any new lumps or nodules in the breasts.complains of occasional hot flashes. But she is mostly complaining of pain in the legs especially her knees. She also would like to know if she can get dental implants.  REVIEW OF SYSTEMS:   Constitutional: Denies fevers, chills or abnormal weight loss Eyes:  Denies blurriness of vision Ears, nose, mouth, throat, and face: Denies mucositis or sore throat Respiratory: Denies cough, dyspnea or wheezes Cardiovascular: Denies palpitation, chest discomfort or lower extremity swelling Gastrointestinal:  Denies nausea, heartburn or change in bowel habits Skin: Denies abnormal skin rashes Lymphatics: Denies new lymphadenopathy or easy bruising Neurological:Denies numbness, tingling or new weaknesses Behavioral/Psych: Mood is stable, no new changes  Breast:  denies any pain or lumps or nodules in either breasts All other systems were reviewed with the patient and are negative.  I have reviewed the past medical history, past surgical history, social history and family history with the patient and they are unchanged from previous note.  ALLERGIES:  has No Known Allergies.  MEDICATIONS:  Current Outpatient Prescriptions  Medication Sig Dispense Refill  . famotidine (PEPCID) 20 MG tablet Take 20 mg by mouth 2 (two) times daily as needed for heartburn or indigestion.    Marland Kitchen lisinopril-hydrochlorothiazide (PRINZIDE,ZESTORETIC) 20-25 MG per tablet TAKE ONE TABLET BY MOUTH ONCE DAILY 90 tablet 0  . pravastatin (PRAVACHOL) 40 MG tablet Take 1 tablet (40 mg total) by mouth daily. 30 tablet 11  . tamoxifen (NOLVADEX) 10 MG tablet TAKE 2 TABLETS BY MOUTH ONCE DAILY 180 tablet 1  . [DISCONTINUED] Prochlorperazine Maleate (COMPAZINE PO) Take by mouth.     No current facility-administered medications for this visit.    PHYSICAL EXAMINATION: ECOG PERFORMANCE STATUS: 0 - Asymptomatic  Filed Vitals:   07/05/14 1149  BP: 150/83  Pulse: 63  Temp: 98.3 F (36.8  C)  Resp: 18   Filed Weights   07/05/14 1149  Weight: 173 lb 4.8 oz (78.608 kg)    GENERAL:alert, no distress and comfortable SKIN: skin color, texture, turgor are normal, no rashes or significant lesions EYES: normal, Conjunctiva are pink and non-injected, sclera clear OROPHARYNX:no exudate, no  erythema and lips, buccal mucosa, and tongue normal  NECK: supple, thyroid normal size, non-tender, without nodularity LYMPH:  no palpable lymphadenopathy in the cervical, axillary or inguinal LUNGS: clear to auscultation and percussion with normal breathing effort HEART: regular rate & rhythm and no murmurs and no lower extremity edema ABDOMEN:abdomen soft, non-tender and normal bowel sounds Musculoskeletal:no cyanosis of digits and no clubbing  NEURO: alert & oriented x 3 with fluent speech, no focal motor/sensory deficits BREAST: No palpable masses or nodules in either right or left breasts. No palpable axillary supraclavicular or infraclavicular adenopathy no breast tenderness or nipple discharge. (exam performed in the presence of a chaperone)  LABORATORY DATA:  I have reviewed the data as listed   Chemistry      Component Value Date/Time   NA 142 12/29/2013 1515   NA 141 10/26/2013 0318   K 3.1* 12/29/2013 1515   K 3.6* 10/26/2013 0318   CL 98 10/26/2013 0318   CL 103 09/23/2012 1040   CO2 27 12/29/2013 1515   CO2 26 10/26/2013 0318   BUN 9.4 12/29/2013 1515   BUN 14 10/26/2013 0318   CREATININE 1.0 12/29/2013 1515   CREATININE 0.96 10/26/2013 0318   CREATININE 0.82 04/07/2013 1501      Component Value Date/Time   CALCIUM 9.4 12/29/2013 1515   CALCIUM 9.6 10/26/2013 0318   ALKPHOS 45 12/29/2013 1515   ALKPHOS 44 10/26/2013 0318   AST 16 12/29/2013 1515   AST 13 10/26/2013 0318   ALT 12 12/29/2013 1515   ALT 9 10/26/2013 0318   BILITOT 0.23 12/29/2013 1515   BILITOT 0.3 10/26/2013 0318       Lab Results  Component Value Date   WBC 5.2 12/29/2013   HGB 12.3 12/29/2013   HCT 36.1 12/29/2013   MCV 87.0 12/29/2013   PLT 188 12/29/2013   NEUTROABS 2.6 12/29/2013   ASSESSMENT & PLAN:  Primary cancer of upper outer quadrant of right female breast Right breast invasive ductal carcinoma multifocal disease ER/PR positive HER-2 negative T2, N1, M0 stage IIB status  post mastectomy, adjuvant chemotherapy and radiation now on antiestrogen therapy with tamoxifen since October 2014.  Tamoxifen toxicities:Patient is tolerating tamoxifen extremely well without any major problems or concerns.  1. Hot flashes are manageable 2. Muscle aches and pains especially in the knees and lower extremities. She takes Aleve which seems to be helping. I recommended that she try Claritin-D for a couple of weeks to see if it would help her symptoms. If it does not help and she can quit taking it.  I encouraged her to lose some weight and do water aerobics at [facility]. I signed and provided her with information for the [employer] which will provide her 3 month free at [facility].  Breast cancer Surveillance: Patient will need annual mammograms and breast exams.  1. Breast exam 07/05/2014 did not reveal any abnormalities. 2. Mammogram has been scheduled for end of April 2016  Survivorship:Discussed the importance of physical exercise in decreasing the likelihood of breast cancer recurrence. Recommended 30 mins daily 6 days a week of either brisk walking or cycling or swimming. Encouraged patient to eat more fruits and  vegetables and decrease red meat.   Return to clinic in 6 months for follow-up  No orders of the defined types were placed in this encounter.   The patient has a good understanding of the overall plan. she agrees with it. She will call with any problems that may develop before her next visit here.   Rulon Eisenmenger, MD

## 2014-07-26 ENCOUNTER — Ambulatory Visit
Admission: RE | Admit: 2014-07-26 | Discharge: 2014-07-26 | Disposition: A | Discharge status: Home / Self Care | Payer: BLUE CROSS/BLUE SHIELD | Source: Ambulatory Visit

## 2014-07-26 DIAGNOSIS — Z1231 Encounter for screening mammogram for malignant neoplasm of breast: Secondary | ICD-10-CM

## 2014-07-30 ENCOUNTER — Ambulatory Visit (INDEPENDENT_AMBULATORY_CARE_PROVIDER_SITE_OTHER): Payer: BLUE CROSS/BLUE SHIELD | Admitting: Family Medicine

## 2014-07-30 ENCOUNTER — Encounter: Payer: Self-pay | Admitting: Family Medicine

## 2014-07-30 VITALS — BP 142/84 | HR 73 | Temp 98.4°F | Ht 64.0 in | Wt 165.0 lb

## 2014-07-30 DIAGNOSIS — J0140 Acute pansinusitis, unspecified: Secondary | ICD-10-CM | POA: Diagnosis not present

## 2014-07-30 DIAGNOSIS — J309 Allergic rhinitis, unspecified: Secondary | ICD-10-CM

## 2014-07-30 DIAGNOSIS — J069 Acute upper respiratory infection, unspecified: Secondary | ICD-10-CM

## 2014-07-30 MED ORDER — GUAIFENESIN ER 600 MG PO TB12: 600.0000 mg | ORAL_TABLET | Freq: Two times a day (BID) | ORAL | Status: DC

## 2014-07-30 MED ORDER — FLUTICASONE PROPIONATE 50 MCG/ACT NA SUSP: 2.0000 | Freq: Every day | NASAL | Status: DC

## 2014-07-30 NOTE — Progress Notes (Signed)
    Subjective   Tasha Ortiz is a 59 y.o. female that presents for a same day visit  1. Cough: Symptoms started yesterday. Symptoms include cough, occipital/temporal headache, body aches, fatigue. Has had itchy eyes that started this morning. She has a previous history of post-nasal drip which has improved. No rhinorrhea or sneezing. No fevers. No sick contacts although a lot of co-workers called out sick last week. Patient has used soup, honey, lemon, lozenges which did not really help with symptoms. She has not tried anything for her headache but it did improve when sleeping. She reports symptoms are somewhat similar to allergies. She has been using Claritin for her allergies  ROS Per HPI  History  Substance Use Topics  . Smoking status: Never Smoker   . Smokeless tobacco: Former Systems developer    Types: [place] date: 11/18/1996  . Alcohol Use: No    No Known Allergies  Objective   BP 142/84 mmHg  Pulse 73  Temp(Src) 98.4 F (36.9 C) (Oral)  Ht 5\' 4"  (1.626 m)  Wt 165 lb (74.844 kg)  BMI 28.31 kg/m2  General: Well appearing although looks slightly tired, no distress HEENT: TMs normal. Frontal and maxillary sinus tenderness. Nares patent and edematous. Oropharynx clear without erythema. No cervical adenoapthy  Assessment and Plan   Meds ordered this encounter  Medications  . fluticasone (FLONASE) 50 MCG/ACT nasal spray    Sig: Place 2 sprays into both nostrils daily.    Dispense:  16 g    Refill:  6  . guaiFENesin (MUCINEX) 600 MG 12 hr tablet    Sig: Take 1 tablet (600 mg total) by mouth 2 (two) times daily.    Dispense:  30 tablet    Refill:  0    Acute upper respiratory infection Allergic rhinitis Acute sinusitis  Symptoms extremely acute. Hard to pinpoint exact cause. Symptoms suggest viral infection with possible concurrent allergy/sinusitis symptoms. Symptomatic treatment for now  Flonase daily  Mucinex BID prn for cough  Tylenol for  headache  Follow-up if symptoms worsen

## 2014-07-30 NOTE — Patient Instructions (Signed)
Thank you for coming to see me today. It was a pleasure. Today we talked about:   Sore throat: You probably have the beginnings of a viral infection. At this point, I would suggest supportive treatment, including Tylenol for sore throat, honey, tea, etc. For now, no further testing.  Sinusitis: I will prescribe Flonase to see if this helps with your symptoms. I do not feel using antibiotics is warranted at this time.  If your symptoms do not improve in the next 2 weeks, please return for follow-up  If you have any questions or concerns, please do not hesitate to call the office at [phone].  Sincerely,  Cordelia Poche, MD

## 2014-08-05 ENCOUNTER — Other Ambulatory Visit: Payer: Self-pay | Admitting: Family Medicine

## 2014-08-06 ENCOUNTER — Other Ambulatory Visit: Payer: Self-pay | Admitting: *Deleted

## 2014-08-06 DIAGNOSIS — C50419 Malignant neoplasm of upper-outer quadrant of unspecified female breast: Secondary | ICD-10-CM

## 2014-08-06 MED ORDER — TAMOXIFEN CITRATE 10 MG PO TABS: 20.0000 mg | ORAL_TABLET | Freq: Every day | ORAL | Status: DC

## 2014-10-13 ENCOUNTER — Telehealth: Payer: Self-pay | Admitting: Family Medicine

## 2014-10-13 ENCOUNTER — Telehealth: Payer: Self-pay | Admitting: *Deleted

## 2014-10-13 ENCOUNTER — Other Ambulatory Visit: Payer: Self-pay | Admitting: *Deleted

## 2014-10-13 DIAGNOSIS — C50411 Malignant neoplasm of upper-outer quadrant of right female breast: Secondary | ICD-10-CM

## 2014-10-13 DIAGNOSIS — C50419 Malignant neoplasm of upper-outer quadrant of unspecified female breast: Secondary | ICD-10-CM

## 2014-10-13 MED ORDER — TAMOXIFEN CITRATE 10 MG PO TABS: 20.0000 mg | ORAL_TABLET | Freq: Every day | ORAL | Status: DC

## 2014-10-13 NOTE — Telephone Encounter (Signed)
Pt's insurance requires a 90 day supply.  Derl Barrow, RN

## 2014-10-13 NOTE — Telephone Encounter (Signed)
TC from patient this am stating that she needs her medications filled at [facility] on [address] now and is required to have a 90 day supply. She gets her Tamoxifen from Dr. Lindi Adie. The rest of her medications are from her pcp. We will fill her Tamoxifen @ [facility] as requested.

## 2014-10-13 NOTE — Telephone Encounter (Signed)
[facility] [address] for 90day suppy for pravastatin,

## 2014-10-13 NOTE — Telephone Encounter (Signed)
Pt called because she needs a refill on her Pravastatin called in to her NEW pharmacy[facility] [address]. She needs this to be 90 day qty. Her insurance only will pay for the medication this way. jw

## 2014-10-21 MED ORDER — LORATADINE-PSEUDOEPHEDRINE ER 10-240 MG PO TB24: 1.0000 | ORAL_TABLET | Freq: Every day | ORAL | Status: DC

## 2014-11-03 ENCOUNTER — Ambulatory Visit (INDEPENDENT_AMBULATORY_CARE_PROVIDER_SITE_OTHER): Payer: BLUE CROSS/BLUE SHIELD | Admitting: Family

## 2014-11-03 ENCOUNTER — Encounter: Payer: Self-pay | Admitting: Family

## 2014-11-03 VITALS — BP 116/78 | HR 60 | Temp 98.7°F | Wt 163.5 lb

## 2014-11-03 DIAGNOSIS — I10 Essential (primary) hypertension: Secondary | ICD-10-CM | POA: Diagnosis not present

## 2014-11-03 DIAGNOSIS — E785 Hyperlipidemia, unspecified: Secondary | ICD-10-CM | POA: Diagnosis not present

## 2014-11-03 DIAGNOSIS — K30 Functional dyspepsia: Secondary | ICD-10-CM

## 2014-11-03 DIAGNOSIS — R109 Unspecified abdominal pain: Secondary | ICD-10-CM | POA: Insufficient documentation

## 2014-11-03 MED ORDER — LISINOPRIL-HYDROCHLOROTHIAZIDE 20-25 MG PO TABS: 1.0000 | ORAL_TABLET | Freq: Every day | ORAL | Status: DC

## 2014-11-03 NOTE — Progress Notes (Signed)
Pre visit review using our clinic review tool, if applicable. No additional management support is needed unless otherwise documented below in the visit note. 

## 2014-11-03 NOTE — Patient Instructions (Addendum)
Thank you for choosing [facility].  Summary/Instructions:  Please continue to take your medication as prescribed.  Your prescription(s) have been submitted to your pharmacy or been printed and provided for you. Please take as directed and contact our office if you believe you are having problem(s) with the medication(s) or have any questions.  Please stop by the lab on the basement level of the building for your blood work. Your results will be released to [facility] (or called to you) after review, usually within 72 hours after test completion. If any changes need to be made, you will be notified at that same time.  If your symptoms worsen or fail to improve, please contact our office for further instruction, or in case of emergency go directly to the emergency room at the closest medical facility.    Ezetimibe Tablets What is this medicine? EZETIMIBE (ez ET i mibe) blocks the absorption of cholesterol from the stomach. It can help lower blood cholesterol for patients who are at risk of getting heart disease or a stroke. It is only for patients whose cholesterol level is not controlled by diet. This medicine may be used for other purposes; ask your health care provider or pharmacist if you have questions. COMMON BRAND NAME(S): Zetia What should I tell my health care provider before I take this medicine? They need to know if you have any of these conditions: -liver disease -an unusual or allergic reaction to ezetimibe, medicines, foods, dyes, or preservatives -pregnant or trying to get pregnant -breast-feeding How should I use this medicine? Take this medicine by mouth with a glass of water. Follow the directions on the prescription label. This medicine can be taken with or without food. Take your doses at regular intervals. Do not take your medicine more often than directed. Talk to your pediatrician regarding the use of this medicine in children. Special care may be  needed. Overdosage: If you think you have taken too much of this medicine contact a poison control center or emergency room at once. NOTE: This medicine is only for you. Do not share this medicine with others. What if I miss a dose? If you miss a dose, take it as soon as you can. If it is almost time for your next dose, take only that dose. Do not take double or extra doses. What may interact with this medicine? Do not take this medicine with any of the following medications: -fenofibrate -gemfibrozil This medicine may also interact with the following medications: -antacids -cyclosporine -herbal medicines like red yeast rice -other medicines to lower cholesterol or triglycerides This list may not describe all possible interactions. Give your health care provider a list of all the medicines, herbs, non-prescription drugs, or dietary supplements you use. Also tell them if you smoke, drink alcohol, or use illegal drugs. Some items may interact with your medicine. What should I watch for while using this medicine? Visit your doctor or health care professional for regular checks on your progress. You will need to have your cholesterol levels checked. If you are also taking some other cholesterol medicines, you will also need to have tests to make sure your liver is working properly. Tell your doctor or health care professional if you get any unexplained muscle pain, tenderness, or weakness, especially if you also have a fever and tiredness. You need to follow a low-cholesterol, low-fat diet while you are taking this medicine. This will decrease your risk of getting heart and blood vessel disease. Exercising and avoiding alcohol  and smoking can also help. Ask your doctor or dietician for advice. What side effects may I notice from receiving this medicine? Side effects that you should report to your doctor or health care professional as soon as possible: -allergic reactions like skin rash, itching or  hives, swelling of the face, lips, or tongue -dark yellow or brown urine -unusually weak or tired -yellowing of the skin or eyes Side effects that usually do not require medical attention (report to your doctor or health care professional if they continue or are bothersome): -diarrhea -dizziness -headache -stomach upset or pain This list may not describe all possible side effects. Call your doctor for medical advice about side effects. You may report side effects to FDA at 1-800-FDA-1088. Where should I keep my medicine? Keep out of the reach of children. Store at room temperature between 15 and 30 degrees C (59 and 86 degrees F). Protect from moisture. Keep container tightly closed. Throw away any unused medicine after the expiration date. NOTE: This sheet is a summary. It may not cover all possible information. If you have questions about this medicine, talk to your doctor, pharmacist, or health care provider.  2015, Elsevier/Gold Standard. (2011-09-24 15:39:09)

## 2014-11-03 NOTE — Assessment & Plan Note (Signed)
Symptoms of generalized stomach pain following chemotherapy treatments. Previous endoscopies were negative per patient. Currently maintained on probiotic which has helped slightly. Refer to gastroenterology for further workup and management as this may be post chemotherapy related.

## 2014-11-03 NOTE — Assessment & Plan Note (Signed)
Stable with current regimen of lisinopril-hydrochlorothiazide. Takes medication as prescribed and denies adverse side effects. Due for a vision screen which will be scheduled independently. Continue current dosage of lisinopril-hydrochlorothiazide.
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
## 2014-11-03 NOTE — Assessment & Plan Note (Signed)
Obtain lipid profile to determine current status. Maintained on pravastatin with some side effects of lower extremity muscle pain. Discussed options of changing medications to possibly Zetia. Patient will research medication. Try 1 week off of pravastatin to determine if medication is causing lower extremity pain. Follow-up pending one week trial off of medication and lipid profile results.

## 2014-11-04 ENCOUNTER — Other Ambulatory Visit (INDEPENDENT_AMBULATORY_CARE_PROVIDER_SITE_OTHER): Payer: BLUE CROSS/BLUE SHIELD

## 2014-11-04 ENCOUNTER — Encounter: Payer: Self-pay | Admitting: Family

## 2014-11-04 DIAGNOSIS — E785 Hyperlipidemia, unspecified: Secondary | ICD-10-CM | POA: Diagnosis not present

## 2014-11-04 LAB — LIPID PANEL
Cholesterol: 174 mg/dL (ref 0–200)
HDL: 45.3 mg/dL (ref >39.00)
LDL Cholesterol: 105 mg/dL — ABNORMAL HIGH (ref 0–99)
NonHDL: 128.31
Total CHOL/HDL Ratio: 4
Triglycerides: 118 mg/dL (ref 0.0–149.0)
VLDL: 23.6 mg/dL (ref 0.0–40.0)

## 2014-11-09 ENCOUNTER — Ambulatory Visit (INDEPENDENT_AMBULATORY_CARE_PROVIDER_SITE_OTHER): Payer: BLUE CROSS/BLUE SHIELD | Admitting: Gastroenterology

## 2014-11-09 ENCOUNTER — Encounter: Payer: Self-pay | Admitting: Gastroenterology

## 2014-11-09 ENCOUNTER — Other Ambulatory Visit (INDEPENDENT_AMBULATORY_CARE_PROVIDER_SITE_OTHER): Payer: BLUE CROSS/BLUE SHIELD

## 2014-11-09 DIAGNOSIS — K529 Noninfective gastroenteritis and colitis, unspecified: Secondary | ICD-10-CM | POA: Diagnosis not present

## 2014-11-09 LAB — HIGH SENSITIVITY CRP: CRP, High Sensitivity: 0.87 mg/L (ref 0.000–5.000)

## 2014-11-09 LAB — SEDIMENTATION RATE: Sed Rate: 19 mm/hr (ref 0–22)

## 2014-11-09 NOTE — Assessment & Plan Note (Signed)
Currently NED

## 2014-11-09 NOTE — Progress Notes (Signed)
_                                                                                                                History of Present Illness:  Tasha Ortiz is a 59 year old Afro-American female with history of breast cancer referred at the request of Dr. Elna Breslow for evaluation of abdominal pain, nausea and distention.  For the last 2 years she has been suffering from intermittent episodes of diffuse upper abdominal discomfort accompanied by distention and nausea.  Symptoms may last for 2-3 days at a time.  CT scans in December, 2014 and again in July, 2015, which I reviewed, demonstrated diffuse wall thickening beginning in the jejunum accompanied by surrounding edema and vascular congestion.  Right lower quadrant small bowel loops were involved as well as left upper quadrant loops.  There was a small amount ascites on last years exam.  The patient denies diarrhea or bleeding.  Approximate year ago she underwent upper and lower endoscopy at [facility] which were negative.  In between she's felt fairly well.  She received chemoradiation therapy for breast cancer that ended in 2013.   Past Medical History  Diagnosis Date  . Hypertension   . Hypercholesteremia     taken off chol meds Dec 2012  . Acid reflux   . Heartburn   . Hypertension   . Breast cancer     right    Past Surgical History  Procedure Laterality Date  . Cesarean section  25 years ago  . Tubal ligation    . Mastectomy Right   . Modified mastectomy Right 05/20/2012    Procedure: MODIFIED MASTECTOMY;  Surgeon: Edward Jolly, MD;  Location: [place];  Service: General;  Laterality: Right;  . Portacath placement Left 05/20/2012    Procedure: INSERTION PORT-A-CATH;  Surgeon: Edward Jolly, MD;  Location: [place];  Service: General;  Laterality: Left;  . Breast surgery Right 05/20/12    Rt br mastectomy   family history includes Cancer in her cousin and sister; Diabetes in her daughter; Healthy in her father,  maternal grandfather, maternal grandmother, paternal grandfather, and paternal grandmother; Hypertension in her mother. Current Outpatient Prescriptions  Medication Sig Dispense Refill  . famotidine (PEPCID) 20 MG tablet Take 20 mg by mouth 2 (two) times daily as needed for heartburn or indigestion.    . fluticasone (FLONASE) 50 MCG/ACT nasal spray Place 2 sprays into both nostrils daily. 16 g 6  . guaiFENesin (MUCINEX) 600 MG 12 hr tablet Take 1 tablet (600 mg total) by mouth 2 (two) times daily. 30 tablet 0  . lisinopril-hydrochlorothiazide (PRINZIDE,ZESTORETIC) 20-25 MG per tablet Take 1 tablet by mouth daily. 90 tablet 3  . loratadine-pseudoephedrine (CLARITIN-D 24 HOUR) 10-240 MG per 24 hr tablet Take 1 tablet by mouth daily. 90 tablet 3  . Probiotic Product (PROBIOTIC DAILY) CAPS Take by mouth.    . tamoxifen (NOLVADEX) 10 MG tablet Take 2 tablets (20 mg total) by mouth daily. 180 tablet 1  . pravastatin (  PRAVACHOL) 40 MG tablet Take 1 tablet (40 mg total) by mouth daily. (Patient not taking: Reported on 11/09/2014) 30 tablet 11  . [DISCONTINUED] Prochlorperazine Maleate (COMPAZINE PO) Take by mouth.     No current facility-administered medications for this visit.   Allergies as of 11/09/2014  . (No Known Allergies)    reports that she has never smoked. She quit smokeless tobacco use about 17 years ago. Her smokeless tobacco use included Chew. She reports that she does not drink alcohol or use illicit drugs.   Review of Systems: Pertinent positive and negative review of systems were noted in the above HPI section. All other review of systems were otherwise negative.  Vital signs were reviewed in today's medical record Physical Exam: General: Well developed , well nourished, no acute distress Skin: anicteric Head: Normocephalic and atraumatic Eyes:  sclerae anicteric, EOMI Ears: Normal auditory acuity Mouth: No deformity or lesions Neck: Supple, no masses or thyromegaly Lymph  Nodes: no lymphadenopathy Lungs: Clear throughout to auscultation Heart: Regular rate and rhythm; no murmurs, rubs or bruits Gastroinestinal: Soft,  and non distended. No masses, hepatosplenomegaly or hernias noted. Normal Bowel sounds.  There is mild diffuse tenderness in the periumbilical area Rectal:deferred Musculoskeletal: Symmetrical with no gross deformities  Skin: No lesions on visible extremities Pulses:  Normal pulses noted Extremities: No clubbing, cyanosis, edema or deformities noted Neurological: Alert oriented x 4, grossly nonfocal Cervical Nodes:  No significant cervical adenopathy Inguinal Nodes: No significant inguinal adenopathy Psychological:  Alert and cooperative. Normal mood and affect  See Assessment and Plan under Problem List

## 2014-11-09 NOTE — Assessment & Plan Note (Addendum)
Two-year history of intermittent episodes of upper abdominal and diffuse abdominal pain with distention and nausea with CT scans demonstrating diffuse but nonspecific enteritis.  Infectious etiology is less likely since she's had recurrent episodes.  Inflammatory bowel disease should be ruled out.  Recommendations #1 capsule endoscopy #2 IBD serologies  CC Dr. Elna Breslow

## 2014-11-09 NOTE — Patient Instructions (Addendum)
Your physician has requested that you go to the basement for the following lab work before leaving today:  IBD, CRP, ESR   You have been scheduled for a capsule endoscopy

## 2014-11-18 ENCOUNTER — Telehealth: Payer: Self-pay | Admitting: *Deleted

## 2014-11-18 MED ORDER — PRAVASTATIN SODIUM 40 MG PO TABS: 40.0000 mg | ORAL_TABLET | Freq: Every day | ORAL | Status: DC

## 2014-11-18 NOTE — Telephone Encounter (Signed)
Receive call pt is needing refill on her pravastatin. Verified pharamcy inform pt will send to [facility]. Sent electronically...Johny Chess

## 2014-11-22 ENCOUNTER — Ambulatory Visit (INDEPENDENT_AMBULATORY_CARE_PROVIDER_SITE_OTHER): Payer: Self-pay | Admitting: Gastroenterology

## 2014-11-22 DIAGNOSIS — K529 Noninfective gastroenteritis and colitis, unspecified: Secondary | ICD-10-CM

## 2014-11-22 NOTE — Progress Notes (Signed)
Patient here for capsule endoscopy. Tolerated procedure. Verbalizes understanding of written and verbal instructions. Lot [id number]  Exp.2017-09

## 2014-12-01 ENCOUNTER — Telehealth: Payer: Self-pay

## 2014-12-01 NOTE — Telephone Encounter (Signed)
-----   Message from Alfredia Ferguson, PA-C sent at 12/01/2014  3:37 PM EDT ----- Regarding: capsule Rob, capsule ready for review on this pt.. Study was incomplete   Beth, please call pt and find out if passed capsule- if not please get kub this week..  Also let her know Capsule did not make it all the way thru small bowel- Dr. Deatra Ina will review when he returns from vacation and decide if need to repeat or do any other testing

## 2014-12-01 NOTE — Telephone Encounter (Signed)
I have left message for the patient to call back 

## 2014-12-02 ENCOUNTER — Other Ambulatory Visit: Payer: Self-pay

## 2014-12-02 DIAGNOSIS — T189XXA Foreign body of alimentary tract, part unspecified, initial encounter: Secondary | ICD-10-CM

## 2014-12-02 NOTE — Telephone Encounter (Signed)
Spoke with Tasha Ortiz. She thinks shje saw the capsule, but isn't positive. She agrees to come in for an xray. Denies any abdominal pain presently.

## 2014-12-07 ENCOUNTER — Other Ambulatory Visit: Payer: Self-pay

## 2014-12-07 ENCOUNTER — Ambulatory Visit (INDEPENDENT_AMBULATORY_CARE_PROVIDER_SITE_OTHER)
Admission: RE | Admit: 2014-12-07 | Discharge: 2014-12-07 | Disposition: A | Discharge status: Home / Self Care | Payer: BLUE CROSS/BLUE SHIELD | Source: Ambulatory Visit | Attending: Gastroenterology | Admitting: Gastroenterology

## 2014-12-07 DIAGNOSIS — T184XXA Foreign body in colon, initial encounter: Secondary | ICD-10-CM

## 2014-12-15 ENCOUNTER — Encounter: Payer: Self-pay | Admitting: Gastroenterology

## 2014-12-20 NOTE — Telephone Encounter (Signed)
Patient states she is still having problems. She would like to speak to a nurse.

## 2014-12-21 ENCOUNTER — Other Ambulatory Visit: Payer: Self-pay

## 2014-12-21 ENCOUNTER — Telehealth: Payer: Self-pay

## 2014-12-21 DIAGNOSIS — R1030 Lower abdominal pain, unspecified: Secondary | ICD-10-CM

## 2014-12-21 NOTE — Telephone Encounter (Signed)
Patient advised. Scheduled for the nurse visit on 01/03/15 and colonoscopy on 01/11/15.

## 2014-12-21 NOTE — Telephone Encounter (Signed)
-----   Message from Inda Castle, MD sent at 12/21/2014  8:32 AM EDT ----- Please contact pt and schedule colonoscopy ASAP.  Also check to see whether she ever had IBD serologies drawn.  If not she needs this to be done.  Thanks

## 2014-12-24 ENCOUNTER — Encounter: Payer: Self-pay | Admitting: Gastroenterology

## 2015-01-03 ENCOUNTER — Ambulatory Visit (AMBULATORY_SURGERY_CENTER): Payer: Self-pay | Admitting: *Deleted

## 2015-01-03 ENCOUNTER — Encounter (HOSPITAL_COMMUNITY): Payer: Self-pay | Admitting: *Deleted

## 2015-01-03 VITALS — Ht 64.0 in | Wt 161.0 lb

## 2015-01-03 DIAGNOSIS — R1030 Lower abdominal pain, unspecified: Secondary | ICD-10-CM

## 2015-01-03 MED ORDER — NA SULFATE-K SULFATE-MG SULF 17.5-3.13-1.6 GM/177ML PO SOLN: 1.0000 | Freq: Once | ORAL | Status: DC

## 2015-01-03 NOTE — Progress Notes (Signed)
No egg or soy allergy. No anesthesia problems.  No home O2.  No diet meds.  

## 2015-01-05 ENCOUNTER — Other Ambulatory Visit: Payer: Self-pay | Admitting: *Deleted

## 2015-01-05 DIAGNOSIS — R109 Unspecified abdominal pain: Secondary | ICD-10-CM

## 2015-01-07 ENCOUNTER — Telehealth: Payer: Self-pay | Admitting: Gastroenterology

## 2015-01-07 NOTE — Telephone Encounter (Signed)
Yes, she is still on for that day. Dr Silverio Decamp is taking that day so no one has to reschedule. I will call the patient.

## 2015-01-09 NOTE — Assessment & Plan Note (Signed)
Right breast invasive ductal carcinoma multifocal disease ER/PR positive HER-2 negative T2, N1, M0 stage IIB status post mastectomy, adjuvant chemotherapy and radiation now on antiestrogen therapy with tamoxifen since October 2014.  Tamoxifen toxicities:Patient is tolerating tamoxifen extremely well without any major problems or concerns.  1. Hot flashes are manageable 2. Muscle aches and pains especially in the knees and lower extremities. She takes Aleve which seems to be helping. I recommended that she try Claritin-D for a couple of weeks to see if it would help her symptoms. If it does not help and she can quit taking it.  I encouraged her to lose some weight and do water aerobics at [facility]. I signed and provided her with information for the [employer] which will provide her 3 month free at [facility].  Breast cancer Surveillance: Patient will need annual mammograms and breast exams.  1. Breast exam 01/10/2015 did not reveal any abnormalities. 2. Mammogram 07/26/14: Normal; category C density  RTC in 1 year

## 2015-01-10 ENCOUNTER — Ambulatory Visit (HOSPITAL_BASED_OUTPATIENT_CLINIC_OR_DEPARTMENT_OTHER): Payer: BLUE CROSS/BLUE SHIELD | Admitting: Hematology and Oncology

## 2015-01-10 ENCOUNTER — Encounter: Payer: Self-pay | Admitting: Hematology and Oncology

## 2015-01-10 ENCOUNTER — Telehealth: Payer: Self-pay | Admitting: Hematology and Oncology

## 2015-01-10 VITALS — BP 108/69 | HR 64 | Temp 99.3°F | Resp 18 | Ht 64.0 in | Wt 162.3 lb

## 2015-01-10 DIAGNOSIS — C773 Secondary and unspecified malignant neoplasm of axilla and upper limb lymph nodes: Secondary | ICD-10-CM

## 2015-01-10 DIAGNOSIS — C50419 Malignant neoplasm of upper-outer quadrant of unspecified female breast: Secondary | ICD-10-CM

## 2015-01-10 DIAGNOSIS — C50411 Malignant neoplasm of upper-outer quadrant of right female breast: Secondary | ICD-10-CM | POA: Diagnosis not present

## 2015-01-10 MED ORDER — TAMOXIFEN CITRATE 20 MG PO TABS: 20.0000 mg | ORAL_TABLET | Freq: Every day | ORAL | Status: DC

## 2015-01-10 NOTE — Progress Notes (Signed)
Patient Care Team: Golden Circle, FNP as PCP - General (Family Medicine)  DIAGNOSIS: Primary cancer of upper outer quadrant of right female breast [facility])   Staging form: Breast, AJCC 7th Edition     Clinical: Stage IIB (T2, N1, cM0) - Unsigned       Staging comments: Staged at breast conference 1.22.14      Pathologic: No stage assigned - Unsigned   SUMMARY OF ONCOLOGIC HISTORY:   Primary cancer of upper outer quadrant of right female breast ([place])   04/16/2012 Initial Biopsy Right breast mass biopsy invasive ductal carcinoma with abundant mucin in all the 3 masses, right axillary lymph node biopsy positive for IDC, ER 100% PR percent Ki-67 19%, HER-2 negative ratio 0.94   04/21/2012 Breast MRI Right breast 3 masses 11o'clock position 1.8 cm, now 11:00 position 2 cm, and 10:00 position 2.3 cm with an abnormal level I right axillary lymph node 3 cm   05/20/2012 Surgery Right breast mastectomy invasive ductal carcinoma grade 2; 1.9 cm, 2.5 cm, 1.8 cm, 1/17 LN positive, second and third massive grade 3   06/24/2012 - 10/07/2012 Chemotherapy Adjuvant chemotherapy with Taxotere Cytoxan x6 cycles   10/16/2012 - 12/16/2012 Radiation Therapy Adjuvant radiation therapy by Dr. [name]   01/07/2013 -  Anti-estrogen oral therapy Tamoxifen 20 mg daily    CHIEF COMPLIANT: Follow-up of breast cancer on tamoxifen  INTERVAL HISTORY: Tasha Ortiz is a 59 year old with above-mentioned history of right-sided breast cancer currently on hormonal therapy with tamoxifen since October 2014. She is completed 2 years of therapy. She was having lots of myalgias. But since she lost weight and cut down pork and beef,  her symptoms have markedly improved. She complains of abdominal pain in the lower quadrant. She is seeing a gastroenterologist for who are planning to do Colonoscopy.   REVIEW OF SYSTEMS:   Constitutional: Denies fevers, chills or abnormal weight loss Eyes: Denies blurriness of vision Ears, nose, mouth,  throat, and face: Denies mucositis or sore throat Respiratory: Denies cough, dyspnea or wheezes Cardiovascular: Denies palpitation, chest discomfort or lower extremity swelling Gastrointestinal:  Lower abdominal pain and discomfort Skin: Denies abnormal skin rashes Lymphatics: Denies new lymphadenopathy or easy bruising Neurological:Denies numbness, tingling or new weaknesses Behavioral/Psych: Mood is stable, no new changes  Breast: Previous right mastectomy. No palpable lumps or nodules in the chest wall or axilla or left breast All other systems were reviewed with the patient and are negative.  I have reviewed the past medical history, past surgical history, social history and family history with the patient and they are unchanged from previous note.  ALLERGIES:  has No Known Allergies.  MEDICATIONS:  Current Outpatient Prescriptions  Medication Sig Dispense Refill  . famotidine (PEPCID) 20 MG tablet Take 20 mg by mouth 2 (two) times daily as needed for heartburn or indigestion.    . fluticasone (FLONASE) 50 MCG/ACT nasal spray Place 2 sprays into both nostrils daily. (Patient taking differently: Place 2 sprays into both nostrils daily as needed for allergies or rhinitis. ) 16 g 6  . guaiFENesin (MUCINEX) 600 MG 12 hr tablet Take 1 tablet (600 mg total) by mouth 2 (two) times daily. (Patient taking differently: Take 600 mg by mouth 2 (two) times daily as needed for cough or to loosen phlegm. ) 30 tablet 0  . lisinopril-hydrochlorothiazide (PRINZIDE,ZESTORETIC) 20-25 MG per tablet Take 1 tablet by mouth daily. 90 tablet 3  . loratadine-pseudoephedrine (CLARITIN-D 24 HOUR) 10-240 MG per 24 hr tablet Take  1 tablet by mouth daily. (Patient taking differently: Take 1 tablet by mouth daily as needed for allergies. ) 90 tablet 3  . Na Sulfate-K Sulfate-Mg Sulf (SUPREP BOWEL PREP) SOLN Take 1 kit by mouth once. 177 mL 0  . naproxen sodium (ANAPROX) 220 MG tablet Take 440 mg by mouth 2 (two) times  daily as needed (pain/headache.).    Marland Kitchen pravastatin (PRAVACHOL) 40 MG tablet Take 1 tablet (40 mg total) by mouth daily. (Patient taking differently: Take 40 mg by mouth every morning. ) 90 tablet 3  . Probiotic Product (PROBIOTIC DAILY) CAPS Take 1 tablet by mouth every morning.     . tamoxifen (NOLVADEX) 20 MG tablet Take 1 tablet (20 mg total) by mouth daily. 90 tablet 3  . [DISCONTINUED] Prochlorperazine Maleate (COMPAZINE PO) Take by mouth.     No current facility-administered medications for this visit.    PHYSICAL EXAMINATION: ECOG PERFORMANCE STATUS: 1 - Symptomatic but completely ambulatory  Filed Vitals:   01/10/15 1119  BP: 108/69  Pulse: 64  Temp: 99.3 F (37.4 C)  Resp: 18   Filed Weights   01/10/15 1119  Weight: 162 lb 4.8 oz (73.619 kg)    GENERAL:alert, no distress and comfortable SKIN: skin color, texture, turgor are normal, no rashes or significant lesions EYES: normal, Conjunctiva are pink and non-injected, sclera clear OROPHARYNX:no exudate, no erythema and lips, buccal mucosa, and tongue normal  NECK: supple, thyroid normal size, non-tender, without nodularity LYMPH:  no palpable lymphadenopathy in the cervical, axillary or inguinal LUNGS: clear to auscultation and percussion with normal breathing effort HEART: regular rate & rhythm and no murmurs and no lower extremity edema ABDOMEN:abdomen soft, non-tender and normal bowel sounds Musculoskeletal:no cyanosis of digits and no clubbing  NEURO: alert & oriented x 3 with fluent speech, no focal motor/sensory deficits BREAST: No palpable lumps or nodules in the right chest wall or left breast or axilla (exam performed in the presence of a chaperone)  LABORATORY DATA:  I have reviewed the data as listed   Chemistry      Component Value Date/Time   NA 143 07/05/2014 1101   NA 141 10/26/2013 0318   K 3.7 07/05/2014 1101   K 3.6* 10/26/2013 0318   CL 98 10/26/2013 0318   CL 103 09/23/2012 1040   CO2 27  07/05/2014 1101   CO2 26 10/26/2013 0318   BUN 11.8 07/05/2014 1101   BUN 14 10/26/2013 0318   CREATININE 0.8 07/05/2014 1101   CREATININE 0.96 10/26/2013 0318   CREATININE 0.82 04/07/2013 1501      Component Value Date/Time   CALCIUM 9.4 07/05/2014 1101   CALCIUM 9.6 10/26/2013 0318   ALKPHOS 45 07/05/2014 1101   ALKPHOS 44 10/26/2013 0318   AST 18 07/05/2014 1101   AST 13 10/26/2013 0318   ALT 15 07/05/2014 1101   ALT 9 10/26/2013 0318   BILITOT 0.21 07/05/2014 1101   BILITOT 0.3 10/26/2013 0318       Lab Results  Component Value Date   WBC 4.7 07/05/2014   HGB 12.3 07/05/2014   HCT 36.8 07/05/2014   MCV 87.1 07/05/2014   PLT 213 07/05/2014   NEUTROABS 2.1 07/05/2014     ASSESSMENT & PLAN:  Primary cancer of upper outer quadrant of right female breast Right breast invasive ductal carcinoma multifocal disease ER/PR positive HER-2 negative T2, N1, M0 stage IIB status post mastectomy, adjuvant chemotherapy and radiation now on antiestrogen therapy with tamoxifen since October 2014.  Tamoxifen toxicities:Patient is tolerating tamoxifen extremely well without any major problems or concerns.  1. Hot flashes are manageable 2. Muscle aches and pains especially in the knees and lower extremities. Patient lost weight and since then her symptoms have improved markedly. She quit eating beef and pork. She is planning to substitute all the sweets with fruits.  Breast cancer Surveillance: Patient will need annual mammograms and breast exams.  1. Breast exam 01/10/2015 did not reveal any abnormalities. 2. Mammogram 07/26/14: Normal; category C density  Abdominal pain: Patient is seeing gastroenterology and she is expected to undergo arthroscopy evaluation. She is taking probiotic capsules.  RTC in 1 year  No orders of the defined types were placed in this encounter.   The patient has a good understanding of the overall plan. she agrees with it. she will call with any problems  that may develop before the next visit here.   Rulon Eisenmenger, MD

## 2015-01-10 NOTE — Telephone Encounter (Signed)
Appointments made and avs printed for patient °

## 2015-01-13 ENCOUNTER — Encounter (HOSPITAL_COMMUNITY): Payer: Self-pay | Admitting: *Deleted

## 2015-01-13 ENCOUNTER — Telehealth: Payer: Self-pay

## 2015-01-13 NOTE — Telephone Encounter (Signed)
Patient is scheduled for her colonoscopy for 01/18/15 at 2:00 pm , arrive at 12:30 pm. I have left her a message to call to confirm and to make certain she is clear on her instructions for the prep.

## 2015-01-13 NOTE — Telephone Encounter (Signed)
Patient is instructed. Questions invited and answered.

## 2015-01-18 ENCOUNTER — Encounter (HOSPITAL_COMMUNITY)
Admission: RE | Disposition: A | Discharge status: Home / Self Care | Payer: Self-pay | Source: Ambulatory Visit | Attending: Gastroenterology

## 2015-01-18 ENCOUNTER — Encounter (HOSPITAL_COMMUNITY): Payer: BLUE CROSS/BLUE SHIELD | Admitting: Anesthesiology

## 2015-01-18 ENCOUNTER — Encounter (HOSPITAL_COMMUNITY): Payer: Self-pay | Admitting: Anesthesiology

## 2015-01-18 ENCOUNTER — Encounter (HOSPITAL_COMMUNITY): Payer: Self-pay

## 2015-01-18 ENCOUNTER — Ambulatory Visit (HOSPITAL_COMMUNITY)
Admission: RE | Admit: 2015-01-18 | Discharge: 2015-01-18 | Disposition: A | Discharge status: Home / Self Care | Payer: BLUE CROSS/BLUE SHIELD | Source: Ambulatory Visit | Attending: Gastroenterology | Admitting: Gastroenterology

## 2015-01-18 DIAGNOSIS — Z791 Long term (current) use of non-steroidal anti-inflammatories (NSAID): Secondary | ICD-10-CM | POA: Diagnosis not present

## 2015-01-18 DIAGNOSIS — Z853 Personal history of malignant neoplasm of breast: Secondary | ICD-10-CM | POA: Diagnosis not present

## 2015-01-18 DIAGNOSIS — E7800 Pure hypercholesterolemia, unspecified: Secondary | ICD-10-CM | POA: Diagnosis not present

## 2015-01-18 DIAGNOSIS — Z87891 Personal history of nicotine dependence: Secondary | ICD-10-CM | POA: Insufficient documentation

## 2015-01-18 DIAGNOSIS — Z923 Personal history of irradiation: Secondary | ICD-10-CM | POA: Diagnosis not present

## 2015-01-18 DIAGNOSIS — Z9221 Personal history of antineoplastic chemotherapy: Secondary | ICD-10-CM | POA: Diagnosis not present

## 2015-01-18 DIAGNOSIS — R1030 Lower abdominal pain, unspecified: Secondary | ICD-10-CM

## 2015-01-18 DIAGNOSIS — I10 Essential (primary) hypertension: Secondary | ICD-10-CM | POA: Diagnosis not present

## 2015-01-18 DIAGNOSIS — K219 Gastro-esophageal reflux disease without esophagitis: Secondary | ICD-10-CM | POA: Insufficient documentation

## 2015-01-18 DIAGNOSIS — K6389 Other specified diseases of intestine: Secondary | ICD-10-CM | POA: Diagnosis not present

## 2015-01-18 DIAGNOSIS — R1032 Left lower quadrant pain: Secondary | ICD-10-CM | POA: Diagnosis not present

## 2015-01-18 DIAGNOSIS — Z79810 Long term (current) use of selective estrogen receptor modulators (SERMs): Secondary | ICD-10-CM | POA: Insufficient documentation

## 2015-01-18 DIAGNOSIS — Z79899 Other long term (current) drug therapy: Secondary | ICD-10-CM | POA: Diagnosis not present

## 2015-01-18 DIAGNOSIS — R109 Unspecified abdominal pain: Secondary | ICD-10-CM

## 2015-01-18 DIAGNOSIS — K648 Other hemorrhoids: Secondary | ICD-10-CM | POA: Diagnosis not present

## 2015-01-18 HISTORY — PX: COLONOSCOPY WITH PROPOFOL: SHX5780

## 2015-01-18 SURGERY — COLONOSCOPY WITH PROPOFOL
Anesthesia: Monitor Anesthesia Care

## 2015-01-18 MED ORDER — PROPOFOL 10 MG/ML IV BOLUS
INTRAVENOUS | Status: AC
  Filled 2015-01-18: qty 20

## 2015-01-18 MED ORDER — SODIUM CHLORIDE 0.9 % IV SOLN: INTRAVENOUS | Status: DC

## 2015-01-18 MED ORDER — PROPOFOL 500 MG/50ML IV EMUL
INTRAVENOUS | Status: DC | PRN
  Administered 2015-01-18: 50 mg via INTRAVENOUS

## 2015-01-18 MED ORDER — PROPOFOL 500 MG/50ML IV EMUL
INTRAVENOUS | Status: DC | PRN
  Administered 2015-01-18: 300 ug/kg/min via INTRAVENOUS

## 2015-01-18 MED ORDER — LACTATED RINGERS IV SOLN
INTRAVENOUS | Status: DC
  Administered 2015-01-18: 15:00:00 via INTRAVENOUS
  Administered 2015-01-18: 1000 mL via INTRAVENOUS

## 2015-01-18 NOTE — Discharge Instructions (Signed)

## 2015-01-18 NOTE — Anesthesia Preprocedure Evaluation (Addendum)
Anesthesia Evaluation  Patient identified by MRN, date of birth, ID band Patient awake    Reviewed: Allergy & Precautions, NPO status , Patient's Chart, lab work & pertinent test results  Airway Mallampati: I  TM Distance: >3 FB Neck ROM: Full    Dental   Pulmonary    Pulmonary exam normal        Cardiovascular hypertension, Pt. on medications Normal cardiovascular exam     Neuro/Psych    GI/Hepatic GERD  Medicated,  Endo/Other    Renal/GU      Musculoskeletal   Abdominal   Peds  Hematology   Anesthesia Other Findings   Reproductive/Obstetrics                            Anesthesia Physical Anesthesia Plan  ASA: II  Anesthesia Plan: MAC   Post-op Pain Management:    Induction: Intravenous  Airway Management Planned: Simple Face Mask  Additional Equipment:   Intra-op Plan:   Post-operative Plan:   Informed Consent: I have reviewed the patients History and Physical, chart, labs and discussed the procedure including the risks, benefits and alternatives for the proposed anesthesia with the patient or authorized representative who has indicated his/her understanding and acceptance.   Dental advisory given  Plan Discussed with: CRNA and Surgeon  Anesthesia Plan Comments:        Anesthesia Quick Evaluation

## 2015-01-18 NOTE — Transfer of Care (Signed)
Immediate Anesthesia Transfer of Care Note  Patient: Tasha Ortiz  Procedure(s) Performed: Procedure(s): COLONOSCOPY WITH PROPOFOL (N/A)  Patient Location: PACU  Anesthesia Type:MAC  Level of Consciousness:  sedated, patient cooperative and responds to stimulation  Airway & Oxygen Therapy:Patient Spontanous Breathing and Patient connected to face mask oxgen  Post-op Assessment:  Report given to PACU RN and Post -op Vital signs reviewed and stable  Post vital signs:  Reviewed and stable  Last Vitals:  Filed Vitals:   01/18/15 1304  BP: 154/74  Pulse: 65  Temp: 37.1 C  Resp: 12    Complications: No apparent anesthesia complications

## 2015-01-18 NOTE — Op Note (Signed)
[facility] [place], [address]   COLONOSCOPY PROCEDURE REPORT  PATIENT: Tasha, Ortiz  MR#: [id number] BIRTHDATE: 08/26/1955 , 69  yrs. old GENDER: female ENDOSCOPIST: Harl Bowie, MD REFERRED BY:Dr Calone PROCEDURE DATE:  01/18/2015 PROCEDURE:   Colonoscopy with biopsy First Screening Colonoscopy - Avg.  risk and is 50 yrs.  old or older - No.  Prior Negative Screening - Now for repeat screening. Less than 10 yrs Prior Negative Screening - Now for repeat screening.  N/A  History of Adenoma - Now for follow-up colonoscopy & has been > or = to 3 yrs.  N/A  Polyps removed today? No ASA CLASS:   Class III INDICATIONS:abdominal pain in the lower left quadrant. MEDICATIONS: Per Anesthesia  DESCRIPTION OF PROCEDURE:   After the risks benefits and alternatives of the procedure were thoroughly explained, informed consent was obtained.  The digital rectal exam revealed no abnormalities of the rectum.   The Pentax Adult Colonscope [id number] endoscope was introduced through the anus and advanced to the terminal ileum which was intubated for a short distance. No adverse events experienced.   The quality of the prep was good.  The instrument was then slowly withdrawn as the colon was fully examined. Estimated blood loss is zero unless otherwise noted in this procedure report.    COLON FINDINGS: The examined terminal ileum appeared to be normal, random biopsies were obtained. Random biopsies were obtained from colon, mucosa appeared normal through out   Moderate sized internal hemorrhoids were found.   The examination was otherwise normal. Retroflexed views revealed internal hemorrhoids. The time to cecum = 4 mins Withdrawal time = 10 mins        The scope was withdrawn and the procedure completed. COMPLICATIONS: There were no immediate complications.  ENDOSCOPIC IMPRESSION: 1.   The examined terminal ileum appeared to be normal 2.   Moderate  sized internal hemorrhoids 3.   The examination was otherwise normal  RECOMMENDATIONS: Await biopsy results  eSigned:  Harl Bowie, MD 01/18/2015 3:23 PM   cc:

## 2015-01-18 NOTE — H&P (Addendum)
Tasha Ortiz is an 59 y.o. female.   Chief Complaint: abdominal pain HPI: Patient is here for colonoscopy for evaluation of LLQ abdominal pain. On small bowel video capsule, there were some areas of villous blunting. Patient reports persistent LLQ abdominal pain. No blood per rectum or melena  Past Medical History  Diagnosis Date  . Hypertension   . Hypercholesteremia     taken off chol meds Dec 2012  . Acid reflux   . Heartburn   . Hypertension   . Breast cancer ([place])     right,no iv or blood on right sid, chemo and radiation 2014  . Allergy     seasonal    Past Surgical History  Procedure Laterality Date  . Cesarean section  25 years ago  . Tubal ligation    . Mastectomy Right   . Modified mastectomy Right 05/20/2012    Procedure: MODIFIED MASTECTOMY;  Surgeon: Edward Jolly, MD;  Location: [facility];  Service: General;  Laterality: Right;  . Portacath placement Left 05/20/2012    Procedure: INSERTION PORT-A-CATH;  Surgeon: Edward Jolly, MD;  Location: [facility];  Service: General;  Laterality: Left;  . Breast surgery Right 05/20/12    Rt br mastectomy  . Colonoscopy      Family History  Problem Relation Age of Onset  . Hypertension Mother   . Diabetes Daughter   . Cancer Sister     brain  . Cancer Cousin     breast  . Healthy Father   . Healthy Maternal Grandmother   . Healthy Maternal Grandfather   . Healthy Paternal Grandmother   . Healthy Paternal Grandfather   . Colon cancer Neg Hx    Social History:  reports that she has never smoked. She quit smokeless tobacco use about 18 years ago. Her smokeless tobacco use included Chew. She reports that she does not drink alcohol or use illicit drugs.  Allergies: No Known Allergies  Medications Prior to Admission  Medication Sig Dispense Refill  . fluticasone (FLONASE) 50 MCG/ACT nasal spray Place 2 sprays into both nostrils daily. (Patient taking differently: Place 2 sprays into both nostrils daily as needed  for allergies or rhinitis. ) 16 g 6  . lisinopril-hydrochlorothiazide (PRINZIDE,ZESTORETIC) 20-25 MG per tablet Take 1 tablet by mouth daily. 90 tablet 3  . Na Sulfate-K Sulfate-Mg Sulf (SUPREP BOWEL PREP) SOLN Take 1 kit by mouth once. 177 mL 0  . naproxen sodium (ANAPROX) 220 MG tablet Take 440 mg by mouth 2 (two) times daily as needed (pain/headache.).    Marland Kitchen pravastatin (PRAVACHOL) 40 MG tablet Take 1 tablet (40 mg total) by mouth daily. (Patient taking differently: Take 40 mg by mouth every morning. ) 90 tablet 3  . Probiotic Product (PROBIOTIC DAILY) CAPS Take 1 tablet by mouth every morning.     . tamoxifen (NOLVADEX) 20 MG tablet Take 1 tablet (20 mg total) by mouth daily. 90 tablet 3  . famotidine (PEPCID) 20 MG tablet Take 20 mg by mouth 2 (two) times daily as needed for heartburn or indigestion.    Marland Kitchen guaiFENesin (MUCINEX) 600 MG 12 hr tablet Take 1 tablet (600 mg total) by mouth 2 (two) times daily. (Patient taking differently: Take 600 mg by mouth 2 (two) times daily as needed for cough or to loosen phlegm. ) 30 tablet 0  . loratadine-pseudoephedrine (CLARITIN-D 24 HOUR) 10-240 MG per 24 hr tablet Take 1 tablet by mouth daily. (Patient taking differently: Take 1 tablet by mouth  daily as needed for allergies. ) 90 tablet 3    No results found for this or any previous visit (from the past 48 hour(s)). No results found.  Review of Systems  Constitutional: Negative for fever and chills.  Eyes: Negative for blurred vision and double vision.  Respiratory: Negative.   Cardiovascular: Negative for chest pain and palpitations.  Gastrointestinal: Positive for abdominal pain and constipation. Negative for blood in stool and melena.  Genitourinary: Negative.   Musculoskeletal: Negative.   Skin: Negative for itching and rash.  Neurological: Negative for dizziness.  Psychiatric/Behavioral: Negative for depression and suicidal ideas.    Blood pressure 154/74, pulse 65, temperature 98.7 F  (37.1 C), temperature source Oral, resp. rate 12, height 5' 4"  (1.626 m), weight 162 lb (73.483 kg), SpO2 100 %. Physical Exam  Vital signs in last 24 hours: Temp:  [98.7 F (37.1 C)] 98.7 F (37.1 C) (10/18 1304) Pulse Rate:  [65] 65 (10/18 1304) Resp:  [12] 12 (10/18 1304) BP: (154)/(74) 154/74 mmHg (10/18 1304) SpO2:  [100 %] 100 % (10/18 1304) Weight:  [162 lb (73.483 kg)] 162 lb (73.483 kg) (10/18 1304)    General:  Well-developed, well-nourished and in no acute distress Eyes:  anicteric. ENT:   Mouth and posterior pharynx free of lesions.  Neck:   supple w/o thyromegaly or mass.  Lungs: Clear to auscultation bilaterally. Heart:  S1S2, no rubs, murmurs, gallops. Abdomen:  soft, non-tender, no hepatosplenomegaly, hernia, or mass and BS+.  Lymph:  no cervical or supraclavicular adenopathy. Extremities:   no edema Skin   no rash. Neuro:  A&O x 3.  Psych:  appropriate mood and  Affect.    Assessment/Plan 70 yr F with h/o breast cancer in remission here for evaluation of chronic LLQ abdominal pain, work up so far unrevealing for etiology of pain. Plan to proceed with colonoscopy with random colon and TI biopsies Discussed risks and benefits with patient, has agreed to proceed   Harl Bowie 01/18/2015, 1:58 PM

## 2015-01-18 NOTE — Anesthesia Postprocedure Evaluation (Signed)
Anesthesia Post Note  Patient: Tasha Ortiz  Procedure(s) Performed: Procedure(s) (LRB): COLONOSCOPY WITH PROPOFOL (N/A)  Anesthesia type: MAC  Patient location: PACU  Post pain: Pain level controlled  Post assessment: Patient's Cardiovascular Status Stable  Last Vitals:  Filed Vitals:   01/18/15 1545  BP: 163/100  Pulse: 62  Temp:   Resp: 13    Post vital signs: Reviewed and stable  Level of consciousness: sedated  Complications: No apparent anesthesia complications

## 2015-01-19 ENCOUNTER — Encounter (HOSPITAL_COMMUNITY): Payer: Self-pay | Admitting: Gastroenterology

## 2015-01-19 ENCOUNTER — Encounter: Payer: Self-pay | Admitting: Gastroenterology

## 2015-01-24 ENCOUNTER — Encounter (HOSPITAL_COMMUNITY): Payer: Self-pay | Admitting: Emergency Medicine

## 2015-01-24 ENCOUNTER — Emergency Department (HOSPITAL_COMMUNITY)
Admission: EM | Admit: 2015-01-24 | Discharge: 2015-01-24 | Disposition: A | Discharge status: Home / Self Care | Payer: BLUE CROSS/BLUE SHIELD | Attending: Emergency Medicine | Admitting: Emergency Medicine

## 2015-01-24 DIAGNOSIS — T783XXA Angioneurotic edema, initial encounter: Secondary | ICD-10-CM | POA: Diagnosis not present

## 2015-01-24 DIAGNOSIS — I10 Essential (primary) hypertension: Secondary | ICD-10-CM | POA: Insufficient documentation

## 2015-01-24 DIAGNOSIS — Z853 Personal history of malignant neoplasm of breast: Secondary | ICD-10-CM | POA: Insufficient documentation

## 2015-01-24 DIAGNOSIS — Y999 Unspecified external cause status: Secondary | ICD-10-CM | POA: Insufficient documentation

## 2015-01-24 DIAGNOSIS — Y939 Activity, unspecified: Secondary | ICD-10-CM | POA: Diagnosis not present

## 2015-01-24 DIAGNOSIS — K219 Gastro-esophageal reflux disease without esophagitis: Secondary | ICD-10-CM | POA: Insufficient documentation

## 2015-01-24 DIAGNOSIS — E7800 Pure hypercholesterolemia, unspecified: Secondary | ICD-10-CM | POA: Diagnosis not present

## 2015-01-24 DIAGNOSIS — Y929 Unspecified place or not applicable: Secondary | ICD-10-CM | POA: Diagnosis not present

## 2015-01-24 DIAGNOSIS — X58XXXA Exposure to other specified factors, initial encounter: Secondary | ICD-10-CM | POA: Insufficient documentation

## 2015-01-24 DIAGNOSIS — R220 Localized swelling, mass and lump, head: Secondary | ICD-10-CM | POA: Diagnosis present

## 2015-01-24 DIAGNOSIS — Z79899 Other long term (current) drug therapy: Secondary | ICD-10-CM | POA: Diagnosis not present

## 2015-01-24 MED ORDER — FAMOTIDINE 20 MG PO TABS
ORAL_TABLET | ORAL | Status: AC
  Filled 2015-01-24: qty 1

## 2015-01-24 MED ORDER — PREDNISONE 20 MG PO TABS
ORAL_TABLET | ORAL | Status: AC
  Filled 2015-01-24: qty 3

## 2015-01-24 MED ORDER — DIPHENHYDRAMINE HCL 25 MG PO CAPS
25.0000 mg | ORAL_CAPSULE | Freq: Once | ORAL | Status: AC
  Administered 2015-01-24: 25 mg via ORAL

## 2015-01-24 MED ORDER — DIPHENHYDRAMINE HCL 25 MG PO CAPS
ORAL_CAPSULE | ORAL | Status: AC
  Filled 2015-01-24: qty 1

## 2015-01-24 MED ORDER — FAMOTIDINE 20 MG PO TABS
20.0000 mg | ORAL_TABLET | Freq: Once | ORAL | Status: AC
  Administered 2015-01-24: 20 mg via ORAL

## 2015-01-24 MED ORDER — PREDNISONE 20 MG PO TABS
60.0000 mg | ORAL_TABLET | Freq: Every day | ORAL | Status: DC
  Administered 2015-01-24: 60 mg via ORAL

## 2015-01-24 MED ORDER — HYDROCHLOROTHIAZIDE 25 MG PO TABS: 25.0000 mg | ORAL_TABLET | Freq: Every day | ORAL | Status: DC

## 2015-01-24 NOTE — Discharge Instructions (Signed)
Please return for the reasons that we discussed. Return if your sxs are not resolving or for new or worsening symptoms.   Angioedema Angioedema is a sudden swelling of tissues, often of the skin. It can occur on the face or genitals or in the abdomen or other body parts. The swelling usually develops over a short period and gets better in 24 to 48 hours. It often begins during the night and is found when the person wakes up. The person may also get red, itchy patches of skin (hives). Angioedema can be dangerous if it involves swelling of the air passages.  Depending on the cause, episodes of angioedema may only happen once, come back in unpredictable patterns, or repeat for several years and then gradually fade away.  CAUSES  Angioedema can be caused by an allergic reaction to various triggers. It can also result from nonallergic causes, including reactions to drugs, immune system disorders, viral infections, or an abnormal gene that is passed to you from your parents (hereditary). For some people with angioedema, the cause is unknown.  Some things that can trigger angioedema include:   Foods.   Medicines, such as ACE inhibitors, ARBs, nonsteroidal anti-inflammatory agents, or estrogen.   Latex.   Animal saliva.   Insect stings.   Dyes used in X-rays.   Mild injury.   Dental work.  Surgery.  Stress.   Sudden changes in temperature.   Exercise. SIGNS AND SYMPTOMS   Swelling of the skin.  Hives. If these are present, there is also intense itching.  Redness in the affected area.   Pain in the affected area.  Swollen lips or tongue.  Breathing problems. This may happen if the air passages swell.  Wheezing. If internal organs are involved, there may be:   Nausea.   Abdominal pain.   Vomiting.   Difficulty swallowing.   Difficulty passing urine. DIAGNOSIS   Your health care provider will examine the affected area and take a medical and family  history.  Various tests may be done to help determine the cause. Tests may include:  Allergy skin tests to see if the problem is an allergic reaction.   Blood tests to check for hereditary angioedema.   Tests to check for underlying diseases that could cause the condition.   A review of your medicines, including over-the-counter medicines, may be done. TREATMENT  Treatment will depend on the cause of the angioedema. Possible treatments include:   Removal of anything that triggered the condition (such as stopping certain medicines).   Medicines to treat symptoms or prevent attacks. Medicines given may include:   Antihistamines.   Epinephrine injection.   Steroids.   Hospitalization may be required for severe attacks. If the air passages are affected, it can be an emergency. Tubes may need to be placed to keep the airway open. HOME CARE INSTRUCTIONS   Take all medicines as directed by your health care provider.  If you were given medicines for emergency allergy treatment, always carry them with you.  Wear a medical bracelet as directed by your health care provider.   Avoid known triggers. SEEK MEDICAL CARE IF:   You have repeat attacks of angioedema.   Your attacks are more frequent or more severe despite preventive measures.   You have hereditary angioedema and are considering having children. It is important to discuss with your health care provider the risks of passing the condition on to your children. SEEK IMMEDIATE MEDICAL CARE IF:   You have severe  swelling of the mouth, tongue, or lips.  You have difficulty breathing.   You have difficulty swallowing.   You faint. MAKE SURE YOU:  Understand these instructions.  Will watch your condition.  Will get help right away if you are not doing well or get worse.   This information is not intended to replace advice given to you by your health care provider. Make sure you discuss any questions you have  with your health care provider.   Document Released: 05/28/2001 Document Revised: 04/09/2014 Document Reviewed: 11/10/2012 Elsevier Interactive Patient Education [employer].

## 2015-01-24 NOTE — ED Notes (Signed)
MD at bedside. 

## 2015-01-24 NOTE — ED Provider Notes (Signed)
Medical screening examination/treatment/procedure(s) were conducted as a shared visit with non-physician practitioner(s) and myself.  I personally evaluated the patient during the encounter.   EKG Interpretation None     59yF with angioedema. Appears to only affect lips. Denies tongue/oral swelling. Handling secretions. No change in voice. Denies respiratory complaint or difficulty swallowing. Is on ACEI. Will observe in ED. Anticipate discharge assuming no progression of symptoms. Stop lisinopril. Will provide script for HCTZ alone. PCP FU for further med adjustment as needed.   Virgel Manifold, MD 01/24/15 [phone]

## 2015-01-24 NOTE — ED Notes (Signed)
Patient states she woke up with lip swelling this am. Patient is taking lisinopril. Patient able to speak without difficulty at this time.

## 2015-01-24 NOTE — ED Provider Notes (Signed)
CSN: [id number]     Arrival date & time 01/24/15  0612 History   First MD Initiated Contact with Patient 01/24/15 [phone]     Chief Complaint  Patient presents with  . Angioedema    lisinopril     (Consider location/radiation/quality/duration/timing/severity/associated sxs/prior Treatment) HPI Tasha Ortiz is a(n) 59 y.o. female who presents  With angioedema. Onset 4 am this morning and rapidly, progressively worsening. No new meds but takes lisinopril/hctz.  No previous personal hx, but sister has had this. Denies andy bites or stings. NKA.  No wheezing, tongue swelling, cough, changes in voice or hives.  Past Medical History  Diagnosis Date  . Hypertension   . Hypercholesteremia     taken off chol meds Dec 2012  . Acid reflux   . Heartburn   . Hypertension   . Breast cancer ([place])     right,no iv or blood on right sid, chemo and radiation 2014  . Allergy     seasonal   Past Surgical History  Procedure Laterality Date  . Cesarean section  25 years ago  . Tubal ligation    . Mastectomy Right   . Modified mastectomy Right 05/20/2012    Procedure: MODIFIED MASTECTOMY;  Surgeon: Edward Jolly, MD;  Location: [facility];  Service: General;  Laterality: Right;  . Portacath placement Left 05/20/2012    Procedure: INSERTION PORT-A-CATH;  Surgeon: Edward Jolly, MD;  Location: [facility];  Service: General;  Laterality: Left;  . Breast surgery Right 05/20/12    Rt br mastectomy  . Colonoscopy    . Colonoscopy with propofol N/A 01/18/2015    Procedure: COLONOSCOPY WITH PROPOFOL;  Surgeon: Mauri Pole, MD;  Location: [facility];  Service: Endoscopy;  Laterality: N/A;   Family History  Problem Relation Age of Onset  . Hypertension Mother   . Diabetes Daughter   . Cancer Sister     brain  . Cancer Cousin     breast  . Healthy Father   . Healthy Maternal Grandmother   . Healthy Maternal Grandfather   . Healthy Paternal Grandmother   . Healthy Paternal  Grandfather   . Colon cancer Neg Hx    Social History  Substance Use Topics  . Smoking status: Never Smoker   . Smokeless tobacco: Former Systems developer    Types: [place] date: 11/18/1996  . Alcohol Use: No   OB History    Gravida Para Term Preterm AB TAB SAB Ectopic Multiple Living   2 2 2       2      Review of Systems  Ten systems reviewed and are negative for acute change, except as noted in the HPI.    Allergies  Review of patient's allergies indicates no known allergies.  Home Medications   Prior to Admission medications   Medication Sig Start Date End Date Taking? Authorizing Provider  famotidine (PEPCID) 20 MG tablet Take 20 mg by mouth 2 (two) times daily as needed for heartburn or indigestion.    Historical Provider, MD  fluticasone (FLONASE) 50 MCG/ACT nasal spray Place 2 sprays into both nostrils daily. Patient taking differently: Place 2 sprays into both nostrils daily as needed for allergies or rhinitis.  07/30/14   Mariel Aloe, MD  guaiFENesin (MUCINEX) 600 MG 12 hr tablet Take 1 tablet (600 mg total) by mouth 2 (two) times daily. Patient taking differently: Take 600 mg by mouth 2 (two) times daily as needed for cough or  to loosen phlegm.  07/30/14   Mariel Aloe, MD  lisinopril-hydrochlorothiazide (PRINZIDE,ZESTORETIC) 20-25 MG per tablet Take 1 tablet by mouth daily. 11/03/14   Golden Circle, FNP  loratadine-pseudoephedrine (CLARITIN-D 24 HOUR) 10-240 MG per 24 hr tablet Take 1 tablet by mouth daily. Patient taking differently: Take 1 tablet by mouth daily as needed for allergies.  10/21/14   Carlyle Dolly, MD  pravastatin (PRAVACHOL) 40 MG tablet Take 1 tablet (40 mg total) by mouth daily. Patient taking differently: Take 40 mg by mouth every morning.  11/18/14   Golden Circle, FNP  Probiotic Product (PROBIOTIC DAILY) CAPS Take 1 tablet by mouth every morning.     Historical Provider, MD  tamoxifen (NOLVADEX) 20 MG tablet Take 1 tablet (20 mg total) by  mouth daily. 01/10/15   Nicholas Lose, MD   BP 133/75 mmHg  Pulse 57  Temp(Src) 98.4 F (36.9 C) (Oral)  Resp 16  Ht 5\' 4"  (1.626 m)  Wt 162 lb (73.483 kg)  BMI 27.79 kg/m2  SpO2 100% Physical Exam  Constitutional: She is oriented to person, place, and time. She appears well-developed and well-nourished. No distress.  HENT:  Head: Normocephalic and atraumatic.    Mouth/Throat: Uvula is midline and oropharynx is clear and moist.  Eyes: Conjunctivae are normal. No scleral icterus.  Neck: Normal range of motion.  Cardiovascular: Normal rate, regular rhythm and normal heart sounds.  Exam reveals no gallop and no friction rub.   No murmur heard. Pulmonary/Chest: Effort normal and breath sounds normal. No respiratory distress.  Abdominal: Soft. Bowel sounds are normal. She exhibits no distension and no mass. There is no tenderness. There is no guarding.  Neurological: She is alert and oriented to person, place, and time.  Skin: Skin is warm and dry. She is not diaphoretic.  Nursing note and vitals reviewed.   ED Course  Procedures (including critical care time) Labs Review Labs Reviewed  BASIC METABOLIC PANEL    Imaging Review No results found. I have personally reviewed and evaluated these images and lab results as part of my medical decision-making.   EKG Interpretation None      MDM   Final diagnoses:  None    Patient with sig. Angioedema. Worsening since 4 am this morning. +for Ace inhibitor and sister who had angioedema for unknown reason. Given pepcid, benadryl, prednisone. Obs.    Patient observed for 3 hours in the ED with pregession of lip swelling but no further oral involvement. Speaking in full sentences. No change n phonation . Will discharge with strict warning pprecautions. Stop Lisinopril and begin hctz only. Discussed that normal treatment for angioedema and swelling (prednisone, benadry, pepcid, epi) are utilized but will not have any effect if  angioedema is secondary to lisinopril.  Follow up asap with pcp.  Margarita Mail, PA-C 01/28/15 [id number]  Merryl Hacker, MD 01/30/15 [phone]

## 2015-01-25 ENCOUNTER — Encounter: Payer: Self-pay | Admitting: Family

## 2015-01-25 ENCOUNTER — Ambulatory Visit (INDEPENDENT_AMBULATORY_CARE_PROVIDER_SITE_OTHER): Payer: BLUE CROSS/BLUE SHIELD | Admitting: Family

## 2015-01-25 VITALS — BP 108/68 | HR 63 | Temp 98.5°F | Resp 18 | Ht 64.0 in | Wt 162.0 lb

## 2015-01-25 DIAGNOSIS — T783XXA Angioneurotic edema, initial encounter: Secondary | ICD-10-CM | POA: Insufficient documentation

## 2015-01-25 DIAGNOSIS — T783XXD Angioneurotic edema, subsequent encounter: Secondary | ICD-10-CM

## 2015-01-25 MED ORDER — PREDNISONE 20 MG PO TABS: 20.0000 mg | ORAL_TABLET | Freq: Two times a day (BID) | ORAL | Status: DC

## 2015-01-25 MED ORDER — HYDROCHLOROTHIAZIDE 25 MG PO TABS: 25.0000 mg | ORAL_TABLET | Freq: Every day | ORAL | Status: DC

## 2015-01-25 NOTE — Assessment & Plan Note (Addendum)
Angioedema appears improved with ED prescribed Pepcid, prednisone, and Benadryl. No evidence of anaphylaxis. Lisinopril has been discontinued. Blood pressure is stable today. Start prednisone as needed. Continue to monitor blood pressure at home. Follow-up if blood pressure remains increased. Seek further care if symptoms of angioedema develop again or worsen.

## 2015-01-25 NOTE — Patient Instructions (Signed)
Thank you for choosing [facility].  Summary/Instructions:  Continue to monitor blood pressure at home.   Your prescription(s) have been submitted to your pharmacy or been printed and provided for you. Please take as directed and contact our office if you believe you are having problem(s) with the medication(s) or have any questions.  If your symptoms worsen or fail to improve, please contact our office for further instruction, or in case of emergency go directly to the emergency room at the closest medical facility.

## 2015-01-25 NOTE — Progress Notes (Signed)
Subjective:    Patient ID: Tasha Ortiz, female    DOB: 1955/11/10, 59 y.o.   MRN: [id number]  Chief Complaint  Patient presents with  . Hospitalization Follow-up    got angiodema from taking lisinopril, the swelling went down some but was not given anything    HPI:  Tasha Ortiz is a 59 y.o. female who  has a past medical history of Hypertension; Hypercholesteremia; Acid reflux; Heartburn; Hypertension; Breast cancer ([place]); and Allergy. and presents today for an ED follow up.  1.) Angioedema / Hypertension - Recently seen in the ED and diagnosed with angioedema of the lips and no tongue or oral swelling. They discontinued her lisinopril and placed her on hydrochlorothiazide individually. She was treated with pepcid, benedryl and prednisone. ED records were reviewed in detail.  Blood pressure is now currently managed with hydrochlorothiazide. Denies any additional angioedema or tongue swelling. Blood pressure remains below goal of 140/90 at present. Denies any hypotensive events and takes the medications as prescribed. Modifying factors include taking children's Benedryl which does help.  BP Readings from Last 3 Encounters:  01/25/15 108/68  01/24/15 136/78  01/18/15 163/100    Allergies  Allergen Reactions  . Lisinopril Swelling     Current Outpatient Prescriptions on File Prior to Visit  Medication Sig Dispense Refill  . famotidine (PEPCID) 20 MG tablet Take 20 mg by mouth 2 (two) times daily as needed for heartburn or indigestion.    . fluticasone (FLONASE) 50 MCG/ACT nasal spray Place 2 sprays into both nostrils daily. (Patient taking differently: Place 2 sprays into both nostrils daily as needed for allergies or rhinitis. ) 16 g 6  . guaiFENesin (MUCINEX) 600 MG 12 hr tablet Take 1 tablet (600 mg total) by mouth 2 (two) times daily. (Patient taking differently: Take 600 mg by mouth 2 (two) times daily as needed for cough or to loosen phlegm. ) 30 tablet 0  .  loratadine-pseudoephedrine (CLARITIN-D 24 HOUR) 10-240 MG per 24 hr tablet Take 1 tablet by mouth daily. (Patient taking differently: Take 1 tablet by mouth daily as needed for allergies. ) 90 tablet 3  . pravastatin (PRAVACHOL) 40 MG tablet Take 1 tablet (40 mg total) by mouth daily. (Patient taking differently: Take 40 mg by mouth every morning. ) 90 tablet 3  . Probiotic Product (PROBIOTIC DAILY) CAPS Take 1 tablet by mouth every morning.     . tamoxifen (NOLVADEX) 20 MG tablet Take 1 tablet (20 mg total) by mouth daily. 90 tablet 3  . [DISCONTINUED] Prochlorperazine Maleate (COMPAZINE PO) Take by mouth.     No current facility-administered medications on file prior to visit.     Past Surgical History  Procedure Laterality Date  . Cesarean section  25 years ago  . Tubal ligation    . Mastectomy Right   . Modified mastectomy Right 05/20/2012    Procedure: MODIFIED MASTECTOMY;  Surgeon: Edward Jolly, MD;  Location: [facility];  Service: General;  Laterality: Right;  . Portacath placement Left 05/20/2012    Procedure: INSERTION PORT-A-CATH;  Surgeon: Edward Jolly, MD;  Location: [facility];  Service: General;  Laterality: Left;  . Breast surgery Right 05/20/12    Rt br mastectomy  . Colonoscopy    . Colonoscopy with propofol N/A 01/18/2015    Procedure: COLONOSCOPY WITH PROPOFOL;  Surgeon: Mauri Pole, MD;  Location: [facility];  Service: Endoscopy;  Laterality: N/A;     Review of Systems  Constitutional: Negative for  fever and chills.  Respiratory: Negative for shortness of breath.       Objective:    BP 108/68 mmHg  Pulse 63  Temp(Src) 98.5 F (36.9 C) (Oral)  Resp 18  Ht 5\' 4"  (1.626 m)  Wt 162 lb (73.483 kg)  BMI 27.79 kg/m2  SpO2 99% Nursing note and vital signs reviewed.  Physical Exam  Constitutional: She is oriented to person, place, and time. She appears well-developed and well-nourished. No distress.  HENT:  Mouth/Throat: Uvula is midline,  oropharynx is clear and moist and mucous membranes are normal. No posterior oropharyngeal edema.  Cardiovascular: Normal rate, regular rhythm, normal heart sounds and intact distal pulses.   Pulmonary/Chest: Effort normal and breath sounds normal.  Neurological: She is alert and oriented to person, place, and time.  Skin: Skin is warm and dry.  Psychiatric: She has a normal mood and affect. Her behavior is normal. Judgment and thought content normal.       Assessment & Plan:   Problem List Items Addressed This Visit      Other   Angioedema - Primary    Angioedema appears improved with ED prescribed Pepcid, prednisone, and Benadryl. No evidence of anaphylaxis. Lisinopril has been discontinued. Blood pressure is stable today. Start prednisone as needed. Continue to monitor blood pressure at home. Follow-up if blood pressure remains increased. Seek further care if symptoms of angioedema develop again or worsen.      Relevant Medications   predniSONE (DELTASONE) 20 MG tablet

## 2015-01-25 NOTE — Progress Notes (Signed)
Pre visit review using our clinic review tool, if applicable. No additional management support is needed unless otherwise documented below in the visit note. 

## 2015-02-23 ENCOUNTER — Ambulatory Visit (INDEPENDENT_AMBULATORY_CARE_PROVIDER_SITE_OTHER): Payer: BLUE CROSS/BLUE SHIELD | Admitting: Family

## 2015-02-23 ENCOUNTER — Encounter: Payer: Self-pay | Admitting: Family

## 2015-02-23 VITALS — BP 150/98 | HR 68 | Temp 98.3°F | Resp 18 | Ht 64.0 in | Wt 164.1 lb

## 2015-02-23 DIAGNOSIS — K219 Gastro-esophageal reflux disease without esophagitis: Secondary | ICD-10-CM | POA: Diagnosis not present

## 2015-02-23 DIAGNOSIS — R52 Pain, unspecified: Secondary | ICD-10-CM | POA: Insufficient documentation

## 2015-02-23 DIAGNOSIS — Z23 Encounter for immunization: Secondary | ICD-10-CM

## 2015-02-23 DIAGNOSIS — J069 Acute upper respiratory infection, unspecified: Secondary | ICD-10-CM

## 2015-02-23 DIAGNOSIS — K5900 Constipation, unspecified: Secondary | ICD-10-CM

## 2015-02-23 MED ORDER — FAMOTIDINE 20 MG PO TABS: 20.0000 mg | ORAL_TABLET | Freq: Two times a day (BID) | ORAL | Status: DC | PRN

## 2015-02-23 NOTE — Assessment & Plan Note (Signed)
Symptoms of acid reflux increased in frequency. Has been out of her famotidine. Refill and continue current dosage of famotidine. Follow-up if symptoms worsen or fail to improve.

## 2015-02-23 NOTE — Assessment & Plan Note (Signed)
Symptoms and exam consistent with acute upper respiratory infection that is most likely viral given recent improvement. Continue conservative treatment with over-the-counter medications as needed for symptom relief and supportive care. Follow-up if symptoms worsen or fail to improve.

## 2015-02-23 NOTE — Progress Notes (Signed)
Pre visit review using our clinic review tool, if applicable. No additional management support is needed unless otherwise documented below in the visit note. 

## 2015-02-23 NOTE — Assessment & Plan Note (Signed)
Pain of left side of body with undetermined origin or no significant mechanism. Appears muscle skeletal based on physical exam. Treat conservatively with heat, home exercise therapy, and over-the-counter medications as needed for symptom relief and supportive care. Follow-up if symptoms worsen or fail to improve.

## 2015-02-23 NOTE — Patient Instructions (Signed)
Thank you for choosing [facility].  Summary/Instructions:  Your prescription(s) have been submitted to your pharmacy or been printed and provided for you. Please take as directed and contact our office if you believe you are having problem(s) with the medication(s) or have any questions.  If your symptoms worsen or fail to improve, please contact our office for further instruction, or in case of emergency go directly to the emergency room at the closest medical facility.   Please use over the counter colace, ducolox, or Miralax to help with constipation.   General Recommendations:    Please drink plenty of fluids.  Get plenty of rest   Sleep in humidified air  Use saline nasal sprays  Netti pot   OTC Medications:  Decongestants - helps relieve congestion   Flonase (generic fluticasone) or Nasacort (generic triamcinolone) - please make sure to use the "cross-over" technique at a 45 degree angle towards the opposite eye as opposed to straight up the nasal passageway.   Sudafed (generic pseudoephedrine - Note this is the one that is available behind the pharmacy counter); Products with phenylephrine (-PE) may also be used but is often not as effective as pseudoephedrine.   If you have HIGH BLOOD PRESSURE - Coricidin HBP; AVOID any product that is -D as this contains pseudoephedrine which may increase your blood pressure.  Afrin (oxymetazoline) every 6-8 hours for up to 3 days.   Allergies - helps relieve runny nose, itchy eyes and sneezing   Claritin (generic loratidine), Allegra (fexofenidine), or Zyrtec (generic cyrterizine) for runny nose. These medications should not cause drowsiness.  Note - Benadryl (generic diphenhydramine) may be used however may cause drowsiness  Cough -   Delsym or Robitussin (generic dextromethorphan)  Expectorants - helps loosen mucus to ease removal   Mucinex (generic guaifenesin) as directed on the package.  Headaches / General  Aches   Tylenol (generic acetaminophen) - DO NOT EXCEED 3 grams (3,000 mg) in a 24 hour time period  Advil/Motrin (generic ibuprofen)   Sore Throat -   Salt water gargle   Chloraseptic (generic benzocaine) spray or lozenges / Sucrets (generic dyclonine)    Mid-Back Strain With Rehab  A strain is an injury in which a tendon or muscle is torn. The muscles and tendons of the mid-back are vulnerable to strains. However, these muscles and tendons are very strong and require a great force to be injured. The muscles of the mid-back are responsible for stabilizing the spinal column, as well as spinal twisting (rotation). Strains are classified into three categories. Grade 1 strains cause pain, but the tendon is not lengthened. Grade 2 strains include a lengthened ligament, due to the ligament being stretched or partially ruptured. With grade 2 strains there is still function, although the function may be decreased. Grade 3 strains involve a complete tear of the tendon or muscle, and function is usually impaired. SYMPTOMS   Pain in the middle of the back.  Pain that may affect only one side, and is worse with movement.  Muscle spasms, and often swelling in the back.  Loss of strength of the back muscles.  Crackling sound (crepitation) when the muscles are touched. CAUSES  Mid-back strains occur when a force is placed on the muscles or tendons that is greater than they can handle. Common causes of injury include:  Ongoing overuse of the muscle-tendon units in the middle back, usually from incorrect body posture.  A single violent injury or force applied to the back. RISK  INCREASES WITH:  Sports that involve twisting forces on the spine or a lot of bending at the waist (football, rugby, weightlifting, bowling, golf, tennis, speed skating, racquetball, swimming, running, gymnastics, diving).  Poor strength and flexibility.  Failure to warm up properly before activity.  Family history of  low back pain or disk disorders.  Previous back injury or surgery (especially fusion). PREVENTION  Learn and use proper sports technique.  Warm up and stretch properly before activity.  Allow for adequate recovery between workouts.  Maintain physical fitness:  Strength, flexibility, and endurance.  Cardiovascular fitness. PROGNOSIS  If treated properly, mid-back strains usually heal within 6 weeks. RELATED COMPLICATIONS   Frequently recurring symptoms, resulting in a chronic problem. Properly treating the problem the first time decreases frequency of recurrence.  Chronic inflammation, scarring, and partial muscle-tendon tear.  Delayed healing or resolution of symptoms, especially if activity is resumed too soon.  Prolonged disability. TREATMENT Treatment first involves the use of ice and medicine, to reduce pain and inflammation. As the pain begins to subside, you may begin strengthening and stretching exercises to improve body posture and sport technique. These exercises may be performed at home or with a therapist. Severe injuries may require referral to a therapist for further evaluation and treatment, such as ultrasound. Corticosteroid injections may be given to help reduce inflammation. Biofeedback (watching monitors of your body processes) and psychotherapy may also be prescribed. Prolonged bed rest is felt to do more harm than good. Massage may help break the muscle spasms. Sometimes, an injection of cortisone, with or without local anesthetics, may be given to help relieve the pain and spasms. MEDICATION   If pain medicine is needed, nonsteroidal anti-inflammatory medicines (aspirin and ibuprofen), or other minor pain relievers (acetaminophen), are often advised.  Do not take pain medicine for 7 days before surgery.  Prescription pain relievers may be given, if your caregiver thinks they are needed. Use only as directed and only as much as you need.  Ointments applied to  the skin may be helpful.  Corticosteroid injections may be given by your caregiver. These injections should be reserved for the most serious cases, because they may only be given a certain number of times. HEAT AND COLD:   Cold treatment (icing) should be applied for 10 to 15 minutes every 2 to 3 hours for inflammation and pain, and immediately after activity that aggravates your symptoms. Use ice packs or an ice massage.  Heat treatment may be used before performing stretching and strengthening activities prescribed by your caregiver, physical therapist, or athletic trainer. Use a heat pack or a warm water soak. SEEK IMMEDIATE MEDICAL CARE IF:  Symptoms get worse or do not improve in 2 to 4 weeks, despite treatment.  You develop numbness, weakness, or loss of bowel or bladder function.  New, unexplained symptoms develop. (Drugs used in treatment may produce side effects.) EXERCISES RANGE OF MOTION (ROM) AND STRETCHING EXERCISES - Mid-Back Strain These exercises may help you when beginning to rehabilitate your injury. In order to successfully resolve your symptoms, you must improve your posture. These exercises are designed to help reduce the forward-head and rounded-shoulder posture which contributes to this condition. Your symptoms may resolve with or without further involvement from your physician, physical therapist or athletic trainer. While completing these exercises, remember:   Restoring tissue flexibility helps normal motion to return to the joints. This allows healthier, less painful movement and activity.  An effective stretch should be held for at least 30  seconds.  A stretch should never be painful. You should only feel a gentle lengthening or release in the stretched tissue. STRETCH - Axial Extension  Stand or sit on a firm surface. Assume a good posture: chest up, shoulders drawn back, stomach muscles slightly tense, knees unlocked (if standing) and feet hip width  apart.  Slowly retract your chin, so your head slides back and your chin slightly lowers. Continue to look straight ahead.  You should feel a gentle stretch in the back of your head. Be certain not to feel an aggressive stretch since this can cause headaches later.  Hold for __________ seconds. Repeat __________ times. Complete this exercise __________ times per day. RANGE OF MOTION- Upper Thoracic Extension  Sit on a firm chair with a high back. Assume a good posture: chest up, shoulders drawn back, abdominal muscles slightly tense, and feet hip width apart. Place a small pillow or folded towel in the curve of your lower back, if you are having difficulty maintaining good posture.  Gently brace your neck with your hands, allowing your arms to rest on your chest.  Continue to support your neck and slowly extend your back over the chair. You will feel a stretch across your upper back.  Hold __________ seconds. Slowly return to the starting position. Repeat __________ times. Complete this exercise __________ times per day. RANGE OF MOTION- Mid-Thoracic Extension  Roll a towel so that it is about 4 inches in diameter.  Position the towel lengthwise. Lay on the towel so that your spine, but not your shoulder blades, are supported.  You should feel your mid-back arching toward the floor. To increase the stretch, extend your arms away from your body.  Hold for __________ seconds. Repeat exercise __________ times, __________ times per day. STRENGTHENING EXERCISES - Mid-Back Strain These exercises may help you when beginning to rehabilitate your injury. They may resolve your symptoms with or without further involvement from your physician, physical therapist or athletic trainer. While completing these exercises, remember:   Muscles can gain both the endurance and the strength needed for everyday activities through controlled exercises.  Complete these exercises as instructed by your  physician, physical therapist or athletic trainer. Increase the resistance and repetitions only as guided by your caregiver.  You may experience muscle soreness or fatigue, but the pain or discomfort you are trying to eliminate should never worsen during these exercises. If this pain does worsen, stop and make certain you are following the directions exactly. If the pain is still present after adjustments, discontinue the exercise until you can discuss the trouble with your caregiver. STRENGTHENING - Quadruped, Opposite UE/LE Lift  Assume a hands and knees position on a firm surface. Keep your hands under your shoulders and your knees under your hips. You may place padding under your knees for comfort.  Find your neutral spine and gently tense your abdominal muscles so that you can maintain this position. Your shoulders and hips should form a rectangle that is parallel with the floor and is not twisted.  Keeping your trunk steady, lift your right hand no higher than your shoulder and then your left leg no higher than your hip. Make sure you are not holding your breath. Hold this position __________ seconds.  Continuing to keep your abdominal muscles tense and your back steady, slowly return to your starting position. Repeat with the opposite arm and leg. Repeat __________ times. Complete this exercise __________ times per day.  STRENGTH - Shoulder Extensors  Secure  a rubber exercise band or tubing to a fixed object (table, pole) so that it is at the height of your shoulders when you are either standing, or sitting on a firm armless chair.  With a thumbs-up grip, grasp an end of the band in each hand. Straighten your elbows and lift your hands straight in front of you at shoulder height. Step back away from the secured end of band, until it becomes tense.  Squeezing your shoulder blades together, pull your hands down to the sides of your thighs. Do not allow your hands to go behind you.  Hold for  __________ seconds. Slowly ease the tension on the band, as you reverse the directions and return to the starting position. Repeat __________ times. Complete this exercise __________ times per day.  STRENGTH - Horizontal Abductors Choose one of the two positions to complete this exercise. Prone: lying on stomach:  Lie on your stomach on a firm surface so that your right / left arm overhangs the edge. Rest your forehead on your opposite forearm. With your palm facing the floor and your elbow straight, hold a __________ weight in your hand.  Squeeze your right / left shoulder blade to your mid-back spine and then slowly raise your arm to the height of the bed.  Hold for __________ seconds. Slowly reverse the directions and return to the starting position, controlling the weight as you lower your arm. Repeat __________ times. Complete this exercise __________ times per day. Standing:   Secure a rubber exercise band or tubing, so that it is at the height of your shoulders when you are either standing, or sitting on a firm armless chair.  Grasp an end of the band in each hand and have your palms face each other. Straighten your elbows and lift your hands straight in front of you at shoulder height. Step back away from the secured end of band, until it becomes tense.  Squeeze your shoulder blades together. Keeping your elbows locked and your hands at shoulder height, spread your arms apart, forming a "T" shape with your body. Hold __________ seconds. Slowly ease the tension on the band, as you reverse the directions and return to the starting position. Repeat __________ times. Complete this exercise __________ times per day. STRENGTH - Scapular Retractors and External Rotators, Rowing  Secure a rubber exercise band or tubing, so that it is at the height of your shoulders when you are either standing, or sitting on a firm armless chair.  With a palm-down grip, grasp an end of the band in each hand.  Straighten your elbows and lift your hands straight in front of you at shoulder height. Step back away from the secured end of band, until it becomes tense.  Step 1: Squeeze your shoulder blades together. Bending your elbows, draw your hands to your chest as if you are rowing a boat. At the end of this motion, your hands and elbow should be at shoulder height and your elbows should be out to your sides.  Step 2: Rotate your shoulder to raise your hands above your head. Your forearms should be vertical and your upper arms should be horizontal.  Hold for __________ seconds. Slowly ease the tension on the band, as you reverse the directions and return to the starting position. Repeat __________ times. Complete this exercise __________ times per day.  POSTURE AND BODY MECHANICS CONSIDERATIONS - Mid-Back Strain Keeping correct posture when sitting, standing or completing your activities will reduce the stress put  on different body tissues, allowing injured tissues a chance to heal and limiting painful experiences. The following are general guidelines for improved posture. Your physician or physical therapist will provide you with any instructions specific to your needs. While reading these guidelines, remember:  The exercises prescribed by your provider will help you have the flexibility and strength to maintain correct postures.  The correct posture provides the best environment for your joints to work. All of your joints have less wear and tear when properly supported by a spine with good posture. This means you will experience a healthier, less painful body.  Correct posture must be practiced with all of your activities, especially prolonged sitting and standing. Correct posture is as important when doing repetitive low-stress activities (typing) as it is when doing a single heavy-load activity (lifting). PROPER SITTING POSTURE In order to minimize stress and discomfort on your spine, you must sit  with correct posture. Sitting with good posture should be effortless for a healthy body. Returning to good posture is a gradual process. Many people can work toward this most comfortably by using various supports until they have the flexibility and strength to maintain this posture on their own. When sitting with proper posture, your ears will fall over your shoulders and your shoulders will fall over your hips. You should use the back of the chair to support your upper back. Your lower back will be in a neutral position, just slightly arched. You may place a small pillow or folded towel at the base of your low back for  support.  When working at a desk, create an environment that supports good, upright posture. Without extra support, muscles fatigue and lead to excessive strain on joints and other tissues. Keep these recommendations in mind: CHAIR:  A chair should be able to slide under your desk when your back makes contact with the back of the chair. This allows you to work closely.  The chair's height should allow your eyes to be level with the upper part of your monitor and your hands to be slightly lower than your elbows. BODY POSITION  Your feet should make contact with the floor. If this is not possible, use a foot rest.  Keep your ears over your shoulders. This will reduce stress on your neck and lower back. INCORRECT SITTING POSTURES If you are feeling tired and unable to assume a healthy sitting posture, do not slouch or slump. This puts excessive strain on your back tissues, causing more damage and pain. Healthier options include:  Using more support, like a lumbar pillow.  Switching tasks to something that requires you to be upright or walking.  Talking a brief walk.  Lying down to rest in a neutral-spine position. CORRECT STANDING POSTURES Proper standing posture should be assumed with all daily activities, even if they only take a few moments, like when brushing your teeth. As  in sitting, your ears should fall over your shoulders and your shoulders should fall over your hips. You should keep a slight tension in your abdominal muscles to brace your spine. Your tailbone should point down to the ground, not behind your body, resulting in an over-extended swayback posture.  INCORRECT STANDING POSTURES Common incorrect standing postures include a forward head, locked knees, and an excessive swayback. WALKING Walk with an upright posture. Your ears, shoulders and hips should all line-up. CORRECT LIFTING TECHNIQUES DO :   Assume a wide stance. This will provide you more stability and the opportunity to get  as close as possible to the object which you are lifting.  Tense your abdominals to brace your spine. Bend at the knees and hips. Keeping your back locked in a neutral-spine position, lift using your leg muscles. Lift with your legs, keeping your back straight.  Test the weight of unknown objects before attempting to lift them.  Try to keep your elbows locked down at your sides in order get the best strength from your shoulders when carrying an object.  Always ask for help when lifting heavy or awkward objects. INCORRECT LIFTING TECHNIQUES DO NOT:   Lock your knees when lifting, even if it is a small object.  Bend and twist. Pivot at your feet or move your feet when needing to change directions.  Assume that you can safely pick up even a paperclip without proper posture.   This information is not intended to replace advice given to you by your health care provider. Make sure you discuss any questions you have with your health care provider.   Document Released: 03/19/2005 Document Revised: 08/03/2014 Document Reviewed: 07/01/2008 [employer] Interactive Patient Education [employer].

## 2015-02-23 NOTE — Assessment & Plan Note (Signed)
Symptoms of lower abdominal pain consistent with constipation. Treat conservatively with over-the-counter medications as needed for symptom relief. Follow-up if symptoms worsen or fail to improve with conservative treatment.

## 2015-02-23 NOTE — Progress Notes (Signed)
Subjective:    Patient ID: Tasha Ortiz, female    DOB: [birth year], 57 y.o.   MRN: [id number]  Chief Complaint  Patient presents with  . Flank Pain    on left side and across her stomach started over the weekend, sinus issues    HPI:  Tasha Ortiz is a 59 y.o. female who  has a past medical history of Hypertension; Hypercholesteremia; Acid reflux; Heartburn; Hypertension; Breast cancer ([place]); and Allergy. and presents today for an acute office visit.  1.) Left sided pain - Associated symptom of pain located on her left side and across her lower abdomen has been going on for about 3-4 days. Pain is described as achy and nagging. Describes that when she drinks something she feels discomfort from her axilla down. Modifying factors include Aleve which seem to help with the discomfort. Describes a small bowel movement, and notes she may be slightly constipated. Denies diarrhea, fever, chills, nausea, vomiting or blood in her stools. Has been having increased reflux. Denies trauma to the area or sounds/sensations heard or felt.   2.) Sinuses - Associated symptom of sinus pressure has been going on for about 2-3 days. Denies any modifying factors in attempted treatments or anything that makes it worse. Notes that her symptoms are slightly improved today. Denies any recent antibiotic use.   Allergies  Allergen Reactions  . Lisinopril Swelling     Current Outpatient Prescriptions on File Prior to Visit  Medication Sig Dispense Refill  . fluticasone (FLONASE) 50 MCG/ACT nasal spray Place 2 sprays into both nostrils daily. (Patient taking differently: Place 2 sprays into both nostrils daily as needed for allergies or rhinitis. ) 16 g 6  . guaiFENesin (MUCINEX) 600 MG 12 hr tablet Take 1 tablet (600 mg total) by mouth 2 (two) times daily. (Patient taking differently: Take 600 mg by mouth 2 (two) times daily as needed for cough or to loosen phlegm. ) 30 tablet 0  . hydrochlorothiazide  (HYDRODIURIL) 25 MG tablet Take 1 tablet (25 mg total) by mouth daily. 90 tablet 3  . loratadine-pseudoephedrine (CLARITIN-D 24 HOUR) 10-240 MG per 24 hr tablet Take 1 tablet by mouth daily. (Patient taking differently: Take 1 tablet by mouth daily as needed for allergies. ) 90 tablet 3  . pravastatin (PRAVACHOL) 40 MG tablet Take 1 tablet (40 mg total) by mouth daily. (Patient taking differently: Take 40 mg by mouth every morning. ) 90 tablet 3  . predniSONE (DELTASONE) 20 MG tablet Take 1 tablet (20 mg total) by mouth 2 (two) times daily with a meal. 10 tablet 0  . Probiotic Product (PROBIOTIC DAILY) CAPS Take 1 tablet by mouth every morning.     . tamoxifen (NOLVADEX) 20 MG tablet Take 1 tablet (20 mg total) by mouth daily. 90 tablet 3  . [DISCONTINUED] Prochlorperazine Maleate (COMPAZINE PO) Take by mouth.     No current facility-administered medications on file prior to visit.    Past Medical History  Diagnosis Date  . Hypertension   . Hypercholesteremia     taken off chol meds Dec 2012  . Acid reflux   . Heartburn   . Hypertension   . Breast cancer ([place])     right,no iv or blood on right sid, chemo and radiation 2014  . Allergy     seasonal    Review of Systems  Constitutional: Negative for fever and chills.  Respiratory: Negative for chest tightness and shortness of breath.   Cardiovascular: Negative  for chest pain, palpitations and leg swelling.  Gastrointestinal: Positive for abdominal pain and constipation. Negative for nausea, vomiting, diarrhea and blood in stool.  Musculoskeletal:       Positive for side pain.   Neurological: Negative for weakness and numbness.      Objective:    BP 150/98 mmHg  Pulse 68  Temp(Src) 98.3 F (36.8 C) (Oral)  Resp 18  Ht 5\' 4"  (1.626 m)  Wt 164 lb 1.9 oz (74.444 kg)  BMI 28.16 kg/m2  SpO2 97% Nursing note and vital signs reviewed.  Physical Exam  Constitutional: She is oriented to person, place, and time. She appears  well-developed and well-nourished. No distress.  HENT:  Right Ear: Hearing, tympanic membrane, external ear and ear canal normal.  Left Ear: Hearing, tympanic membrane, external ear and ear canal normal.  Nose: Nose normal. Right sinus exhibits no maxillary sinus tenderness and no frontal sinus tenderness. Left sinus exhibits no maxillary sinus tenderness and no frontal sinus tenderness.  Mouth/Throat: Uvula is midline, oropharynx is clear and moist and mucous membranes are normal.  Cardiovascular: Normal rate, regular rhythm, normal heart sounds and intact distal pulses.   Pulmonary/Chest: Effort normal and breath sounds normal.  Abdominal: Soft. Normal appearance and bowel sounds are normal. She exhibits no ascites and no mass. There is no hepatosplenomegaly. There is tenderness in the right lower quadrant, suprapubic area and left lower quadrant. There is no rigidity, no guarding, no tenderness at McBurney's point and negative Murphy's sign.  Musculoskeletal:  Left side - No obvious deformity, discoloration or edema noted. Tenderness elicited along thoracic paraspinal musculature. Range of motion is within normal limits for flexion, extension, and rotation. Rib compression test negative.  Neurological: She is alert and oriented to person, place, and time.  Skin: Skin is warm and dry.  Psychiatric: She has a normal mood and affect. Her behavior is normal. Judgment and thought content normal.       Assessment & Plan:   Problem List Items Addressed This Visit      Respiratory   Acute upper respiratory infection    Symptoms and exam consistent with acute upper respiratory infection that is most likely viral given recent improvement. Continue conservative treatment with over-the-counter medications as needed for symptom relief and supportive care. Follow-up if symptoms worsen or fail to improve.        Digestive   Acid reflux    Symptoms of acid reflux increased in frequency. Has been out  of her famotidine. Refill and continue current dosage of famotidine. Follow-up if symptoms worsen or fail to improve.      Relevant Medications   famotidine (PEPCID) 20 MG tablet   Constipation    Symptoms of lower abdominal pain consistent with constipation. Treat conservatively with over-the-counter medications as needed for symptom relief. Follow-up if symptoms worsen or fail to improve with conservative treatment.        Other   Pain of left side of body - Primary    Pain of left side of body with undetermined origin or no significant mechanism. Appears muscle skeletal based on physical exam. Treat conservatively with heat, home exercise therapy, and over-the-counter medications as needed for symptom relief and supportive care. Follow-up if symptoms worsen or fail to improve.       Other Visit Diagnoses    Encounter for immunization

## 2015-04-07 ENCOUNTER — Telehealth: Payer: Self-pay | Admitting: *Deleted

## 2015-04-07 ENCOUNTER — Other Ambulatory Visit: Payer: Self-pay

## 2015-04-07 NOTE — Telephone Encounter (Signed)
Pt states she needs to get an appt to have port flushed- has not been flushed in ~ 3 months.

## 2015-04-08 ENCOUNTER — Telehealth: Payer: Self-pay | Admitting: Hematology and Oncology

## 2015-04-08 NOTE — Telephone Encounter (Signed)
Called and left a message with flush appointment °

## 2015-04-14 ENCOUNTER — Ambulatory Visit (HOSPITAL_BASED_OUTPATIENT_CLINIC_OR_DEPARTMENT_OTHER): Payer: BLUE CROSS/BLUE SHIELD

## 2015-04-14 VITALS — BP 157/77 | HR 88 | Temp 98.7°F | Resp 20

## 2015-04-14 DIAGNOSIS — Z452 Encounter for adjustment and management of vascular access device: Secondary | ICD-10-CM | POA: Diagnosis not present

## 2015-04-14 DIAGNOSIS — C50411 Malignant neoplasm of upper-outer quadrant of right female breast: Secondary | ICD-10-CM | POA: Diagnosis not present

## 2015-04-14 DIAGNOSIS — Z95828 Presence of other vascular implants and grafts: Secondary | ICD-10-CM

## 2015-04-14 MED ORDER — SODIUM CHLORIDE 0.9 % IJ SOLN
10.0000 mL | INTRAMUSCULAR | Status: DC | PRN
  Administered 2015-04-14: 10 mL via INTRAVENOUS
  Filled 2015-04-14: qty 10

## 2015-04-14 MED ORDER — HEPARIN SOD (PORK) LOCK FLUSH 100 UNIT/ML IV SOLN
500.0000 [IU] | Freq: Once | INTRAVENOUS | Status: AC
  Administered 2015-04-14: 500 [IU] via INTRAVENOUS
  Filled 2015-04-14: qty 5

## 2015-04-14 NOTE — Patient Instructions (Signed)

## 2015-05-23 ENCOUNTER — Ambulatory Visit (HOSPITAL_BASED_OUTPATIENT_CLINIC_OR_DEPARTMENT_OTHER): Payer: BLUE CROSS/BLUE SHIELD

## 2015-05-23 DIAGNOSIS — C50411 Malignant neoplasm of upper-outer quadrant of right female breast: Secondary | ICD-10-CM

## 2015-05-23 DIAGNOSIS — C773 Secondary and unspecified malignant neoplasm of axilla and upper limb lymph nodes: Secondary | ICD-10-CM

## 2015-05-23 DIAGNOSIS — Z452 Encounter for adjustment and management of vascular access device: Secondary | ICD-10-CM

## 2015-05-23 DIAGNOSIS — Z95828 Presence of other vascular implants and grafts: Secondary | ICD-10-CM

## 2015-05-23 MED ORDER — HEPARIN SOD (PORK) LOCK FLUSH 100 UNIT/ML IV SOLN
500.0000 [IU] | Freq: Once | INTRAVENOUS | Status: AC
  Administered 2015-05-23: 500 [IU] via INTRAVENOUS
  Filled 2015-05-23: qty 5

## 2015-05-23 MED ORDER — SODIUM CHLORIDE 0.9% FLUSH
10.0000 mL | INTRAVENOUS | Status: DC | PRN
  Administered 2015-05-23: 10 mL via INTRAVENOUS
  Filled 2015-05-23: qty 10

## 2015-05-23 NOTE — Patient Instructions (Signed)

## 2015-07-04 ENCOUNTER — Ambulatory Visit (HOSPITAL_BASED_OUTPATIENT_CLINIC_OR_DEPARTMENT_OTHER): Payer: BLUE CROSS/BLUE SHIELD

## 2015-07-04 DIAGNOSIS — Z95828 Presence of other vascular implants and grafts: Secondary | ICD-10-CM

## 2015-07-04 DIAGNOSIS — C50411 Malignant neoplasm of upper-outer quadrant of right female breast: Secondary | ICD-10-CM | POA: Diagnosis not present

## 2015-07-04 DIAGNOSIS — Z452 Encounter for adjustment and management of vascular access device: Secondary | ICD-10-CM | POA: Diagnosis not present

## 2015-07-04 MED ORDER — HEPARIN SOD (PORK) LOCK FLUSH 100 UNIT/ML IV SOLN
500.0000 [IU] | Freq: Once | INTRAVENOUS | Status: AC
  Administered 2015-07-04: 500 [IU] via INTRAVENOUS
  Filled 2015-07-04: qty 5

## 2015-07-04 MED ORDER — SODIUM CHLORIDE 0.9% FLUSH
10.0000 mL | INTRAVENOUS | Status: DC | PRN
  Administered 2015-07-04: 10 mL via INTRAVENOUS
  Filled 2015-07-04: qty 10

## 2015-07-04 NOTE — Patient Instructions (Signed)

## 2015-07-12 ENCOUNTER — Other Ambulatory Visit: Payer: Self-pay

## 2015-07-12 DIAGNOSIS — Z1231 Encounter for screening mammogram for malignant neoplasm of breast: Secondary | ICD-10-CM

## 2015-07-28 ENCOUNTER — Ambulatory Visit (INDEPENDENT_AMBULATORY_CARE_PROVIDER_SITE_OTHER): Payer: BLUE CROSS/BLUE SHIELD | Admitting: Family

## 2015-07-28 ENCOUNTER — Encounter: Payer: Self-pay | Admitting: Family

## 2015-07-28 ENCOUNTER — Other Ambulatory Visit (INDEPENDENT_AMBULATORY_CARE_PROVIDER_SITE_OTHER): Payer: BLUE CROSS/BLUE SHIELD

## 2015-07-28 VITALS — BP 150/100 | HR 57 | Temp 98.1°F | Resp 14 | Ht 64.0 in | Wt 157.0 lb

## 2015-07-28 DIAGNOSIS — J069 Acute upper respiratory infection, unspecified: Secondary | ICD-10-CM | POA: Diagnosis not present

## 2015-07-28 DIAGNOSIS — I10 Essential (primary) hypertension: Secondary | ICD-10-CM

## 2015-07-28 DIAGNOSIS — R5383 Other fatigue: Secondary | ICD-10-CM

## 2015-07-28 DIAGNOSIS — E785 Hyperlipidemia, unspecified: Secondary | ICD-10-CM | POA: Diagnosis not present

## 2015-07-28 DIAGNOSIS — E860 Dehydration: Secondary | ICD-10-CM

## 2015-07-28 DIAGNOSIS — R7989 Other specified abnormal findings of blood chemistry: Secondary | ICD-10-CM

## 2015-07-28 LAB — COMPREHENSIVE METABOLIC PANEL
ALT: 15 U/L (ref 0–35)
AST: 17 U/L (ref 0–37)
Albumin: 4.6 g/dL (ref 3.5–5.2)
Alkaline Phosphatase: 33 U/L — ABNORMAL LOW (ref 39–117)
BUN: 13 mg/dL (ref 6–23)
CO2: 29 mEq/L (ref 19–32)
Calcium: 10.3 mg/dL (ref 8.4–10.5)
Chloride: 105 mEq/L (ref 96–112)
Creatinine, Ser: 0.77 mg/dL (ref 0.40–1.20)
GFR: 99.12 mL/min (ref >60.00)
Glucose, Bld: 101 mg/dL — ABNORMAL HIGH (ref 70–99)
Potassium: 3.6 mEq/L (ref 3.5–5.1)
Sodium: 143 mEq/L (ref 135–145)
Total Bilirubin: 0.3 mg/dL (ref 0.2–1.2)
Total Protein: 8 g/dL (ref 6.0–8.3)

## 2015-07-28 LAB — TSH: TSH: 5.11 u[IU]/mL — ABNORMAL HIGH (ref 0.35–4.50)

## 2015-07-28 LAB — CBC
HCT: 43.2 % (ref 36.0–46.0)
Hemoglobin: 14.6 g/dL (ref 12.0–15.0)
MCHC: 33.8 g/dL (ref 30.0–36.0)
MCV: 86.4 fl (ref 78.0–100.0)
Platelets: 195 10*3/uL (ref 150.0–400.0)
RBC: 5 Mil/uL (ref 3.87–5.11)
RDW: 13.2 % (ref 11.5–15.5)
WBC: 4.2 10*3/uL (ref 4.0–10.5)

## 2015-07-28 MED ORDER — PRAVASTATIN SODIUM 40 MG PO TABS: 40.0000 mg | ORAL_TABLET | Freq: Every day | ORAL | Status: DC

## 2015-07-28 NOTE — Assessment & Plan Note (Signed)
Symptoms and exam consistent with acute upper respiratory infection most likely viral. Continue over-the-counter medications as needed for symptom relief and supportive care. Follow-up if symptoms worsen or fail to improve.

## 2015-07-28 NOTE — Assessment & Plan Note (Signed)
Fatigue most likely related to dehydration and a viral illness. Obtain CBC, see med, and TSH to rule out other metabolic causes. Follow-up if symptoms worsen or do not improve.

## 2015-07-28 NOTE — Progress Notes (Signed)
Pre visit review using our clinic review tool, if applicable. No additional management support is needed unless otherwise documented below in the visit note. 

## 2015-07-28 NOTE — Patient Instructions (Signed)
Thank you for choosing [facility].  Summary/Instructions:  Please continue to take her medications as prescribed.  Please drink plenty of nonalcoholic/non-caffeinated beverages including water and sports drinks.  Your prescription(s) have been submitted to your pharmacy or been printed and provided for you. Please take as directed and contact our office if you believe you are having problem(s) with the medication(s) or have any questions.  Please stop by the lab on the basement level of the building for your blood work. Your results will be released to [facility] (or called to you) after review, usually within 72 hours after test completion. If any changes need to be made, you will be notified at that same time.   If your symptoms worsen or fail to improve, please contact our office for further instruction, or in case of emergency go directly to the emergency room at the closest medical facility.

## 2015-07-28 NOTE — Progress Notes (Signed)
Subjective:    Patient ID: Tasha Ortiz, female    DOB: 09/25/1955, 60 y.o.   MRN: [id number]  Chief Complaint  Patient presents with  . Nausea    states that she has had episodes of feeling really hot and having nausea, has some abdominal discomfort, would like a refill of pravastatin, last lipid but last august    HPI:  Tasha Ortiz is a 60 y.o. female who  has a past medical history of Hypertension; Hypercholesteremia; Acid reflux; Heartburn; Hypertension; Breast cancer ([place]); and Allergy. and presents today for a follow up office visit.  1.) Hot/Nausea - Associated symptoms of feeling hot and nausea that has been going on for about 4-5 days. Reports that her symptoms improved with rest. Return to work and had symptoms return once again. Notes some mild abdominal discomfort. Has not had episodes. Denies constipation, diarrhea, vomiting, or blood in her stool. No fevers. No trauma or changes to diet that she can recall.   2.) Hyperlipidemia - Currently maintained on pravastatin. Reports taking medication as prescribed and denies adverse side effects or myalgias.  Lab Results  Component Value Date   CHOL 174 11/04/2014   HDL 45.30 11/04/2014   LDLCALC 105* 11/04/2014   LDLDIRECT 111* 03/10/2012   TRIG 118.0 11/04/2014   CHOLHDL 4 11/04/2014     3.) Hypertension - currently managed on hydrochlorothiazide. Reports taking the medication as prescribed and denies adverse side effects.   BP Readings from Last 3 Encounters:  07/28/15 150/100  04/14/15 157/77  02/23/15 150/98    Allergies  Allergen Reactions  . Lisinopril Swelling     Current Outpatient Prescriptions on File Prior to Visit  Medication Sig Dispense Refill  . famotidine (PEPCID) 20 MG tablet Take 1 tablet (20 mg total) by mouth 2 (two) times daily as needed for heartburn or indigestion. 180 tablet 3  . hydrochlorothiazide (HYDRODIURIL) 25 MG tablet Take 1 tablet (25 mg total) by mouth daily. 90  tablet 3  . tamoxifen (NOLVADEX) 20 MG tablet Take 1 tablet (20 mg total) by mouth daily. 90 tablet 3  . [DISCONTINUED] Prochlorperazine Maleate (COMPAZINE PO) Take by mouth.     No current facility-administered medications on file prior to visit.     Past Surgical History  Procedure Laterality Date  . Cesarean section  25 years ago  . Tubal ligation    . Mastectomy Right   . Modified mastectomy Right 05/20/2012    Procedure: MODIFIED MASTECTOMY;  Surgeon: Edward Jolly, MD;  Location: [facility];  Service: General;  Laterality: Right;  . Portacath placement Left 05/20/2012    Procedure: INSERTION PORT-A-CATH;  Surgeon: Edward Jolly, MD;  Location: [facility];  Service: General;  Laterality: Left;  . Breast surgery Right 05/20/12    Rt br mastectomy  . Colonoscopy    . Colonoscopy with propofol N/A 01/18/2015    Procedure: COLONOSCOPY WITH PROPOFOL;  Surgeon: Mauri Pole, MD;  Location: [facility];  Service: Endoscopy;  Laterality: N/A;    Past Medical History  Diagnosis Date  . Hypertension   . Hypercholesteremia     taken off chol meds Dec 2012  . Acid reflux   . Heartburn   . Hypertension   . Breast cancer ([place])     right,no iv or blood on right sid, chemo and radiation 2014  . Allergy     seasonal     Review of Systems  Constitutional: Positive for fatigue. Negative for fever  and chills.  HENT: Positive for congestion and sinus pressure.   Respiratory: Negative for cough, chest tightness and shortness of breath.   Cardiovascular: Negative for chest pain, palpitations and leg swelling.  Gastrointestinal: Positive for nausea. Negative for vomiting, diarrhea, constipation and blood in stool.  Endocrine: Positive for heat intolerance.  Neurological: Positive for headaches.      Objective:    BP 150/100 mmHg  Pulse 57  Temp(Src) 98.1 F (36.7 C) (Oral)  Resp 14  Ht 5\' 4"  (1.626 m)  Wt 157 lb (71.215 kg)  BMI 26.94 kg/m2  SpO2 98% Nursing note and  vital signs reviewed.  Physical Exam  Constitutional: She is oriented to person, place, and time. She appears well-developed and well-nourished. No distress.  HENT:  Right Ear: Hearing, external ear and ear canal normal.  Left Ear: Hearing, external ear and ear canal normal.  Nose: Right sinus exhibits maxillary sinus tenderness. Right sinus exhibits no frontal sinus tenderness. Left sinus exhibits maxillary sinus tenderness. Left sinus exhibits no frontal sinus tenderness.  Mouth/Throat: Uvula is midline, oropharynx is clear and moist and mucous membranes are normal.  Impacted cerumen noted bilaterally  Cardiovascular: Normal rate, regular rhythm, normal heart sounds and intact distal pulses.   Pulmonary/Chest: Effort normal and breath sounds normal.  Neurological: She is alert and oriented to person, place, and time.  Skin: Skin is warm and dry.  Psychiatric: She has a normal mood and affect. Her behavior is normal. Judgment and thought content normal.       Assessment & Plan:   Problem List Items Addressed This Visit      Cardiovascular and Mediastinum   Essential hypertension, benign    Blood pressure remains slightly elevated above goal 140/90 with current regimen. Patient wishes to continue hydrochlorothiazide with no additional medications presently. Stress importance of lowering blood pressure below goal 140/90 to reduce risk of end organ damage in the future. Continue to monitor blood pressure at home. Follow-up in one month with consideration of addition of losartan to her medication regimen.      Relevant Medications   pravastatin (PRAVACHOL) 40 MG tablet     Respiratory   Acute upper respiratory infection    Symptoms and exam consistent with acute upper respiratory infection most likely viral. Continue over-the-counter medications as needed for symptom relief and supportive care. Follow-up if symptoms worsen or fail to improve.        Other   Hyperlipidemia    Not  currently fasting today. Obtain lipid profile at next office visit. Reports no side effects with current medication. Continue current dosage of pravastatin.      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 Encouraged nonalcoholic/non-caffeinated beverages. Follow-up if symptoms return or worsen.      Relevant Orders   CBC   Comprehensive metabolic panel   Fatigue    Fatigue most likely related to dehydration and a viral illness. Obtain CBC, see med, and TSH to rule out other metabolic causes. Follow-up if symptoms worsen or do not improve.      Relevant Orders   CBC   Comprehensive metabolic panel   TSH       I have discontinued Ms. Reisz's fluticasone, guaiFENesin, loratadine-pseudoephedrine, PROBIOTIC DAILY, and predniSONE. I am also having her maintain her tamoxifen, hydrochlorothiazide, famotidine, and  pravastatin.   Meds ordered this encounter  Medications  . pravastatin (PRAVACHOL) 40 MG tablet    Sig: Take 1 tablet (40 mg total) by mouth daily.    Dispense:  90 tablet    Refill:  1    Order Specific Question:  Supervising Provider    Answer:  Pricilla Holm A [id number]     Follow-up: Return in about 1 month (around 08/27/2015).  Mauricio Po, FNP

## 2015-07-28 NOTE — Assessment & Plan Note (Signed)
Blood pressure remains slightly elevated above goal 140/90 with current regimen. Patient wishes to continue hydrochlorothiazide with no additional medications presently. Stress importance of lowering blood pressure below goal 140/90 to reduce risk of end organ damage in the future. Continue to monitor blood pressure at home. Follow-up in one month with consideration of addition of losartan to her medication regimen.

## 2015-07-28 NOTE — Assessment & Plan Note (Signed)
Not currently fasting today. Obtain lipid profile at next office visit. Reports no side effects with current medication. Continue current dosage of pravastatin.

## 2015-07-28 NOTE — Assessment & Plan Note (Signed)
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 Encouraged nonalcoholic/non-caffeinated beverages. Follow-up if symptoms return or worsen.

## 2015-08-01 ENCOUNTER — Ambulatory Visit
Admission: RE | Admit: 2015-08-01 | Discharge: 2015-08-01 | Disposition: A | Discharge status: Home / Self Care | Payer: BLUE CROSS/BLUE SHIELD | Source: Ambulatory Visit

## 2015-08-01 DIAGNOSIS — Z1231 Encounter for screening mammogram for malignant neoplasm of breast: Secondary | ICD-10-CM

## 2015-09-28 ENCOUNTER — Telehealth: Payer: Self-pay | Admitting: *Deleted

## 2015-09-28 NOTE — Telephone Encounter (Signed)
TC from patient requesting appointment for port flush. Her last flush was on 07/04/15.  POF sent

## 2015-09-29 ENCOUNTER — Telehealth: Payer: Self-pay | Admitting: Hematology and Oncology

## 2015-09-29 NOTE — Telephone Encounter (Signed)
lvm forpt regarding to 7.7 flush

## 2015-10-06 ENCOUNTER — Telehealth: Payer: Self-pay | Admitting: Hematology and Oncology

## 2015-10-06 NOTE — Telephone Encounter (Signed)
returned call and lvm for pt confirming 7.7 cx and moved to 7.4

## 2015-10-14 ENCOUNTER — Ambulatory Visit (HOSPITAL_BASED_OUTPATIENT_CLINIC_OR_DEPARTMENT_OTHER): Payer: BLUE CROSS/BLUE SHIELD

## 2015-10-14 DIAGNOSIS — C50411 Malignant neoplasm of upper-outer quadrant of right female breast: Secondary | ICD-10-CM

## 2015-10-14 DIAGNOSIS — Z452 Encounter for adjustment and management of vascular access device: Secondary | ICD-10-CM

## 2015-10-14 DIAGNOSIS — Z95828 Presence of other vascular implants and grafts: Secondary | ICD-10-CM | POA: Insufficient documentation

## 2015-10-14 MED ORDER — HEPARIN SOD (PORK) LOCK FLUSH 100 UNIT/ML IV SOLN
500.0000 [IU] | Freq: Once | INTRAVENOUS | Status: AC | PRN
  Administered 2015-10-14: 500 [IU] via INTRAVENOUS
  Filled 2015-10-14: qty 5

## 2015-10-14 MED ORDER — SODIUM CHLORIDE 0.9 % IJ SOLN
10.0000 mL | INTRAMUSCULAR | Status: DC | PRN
  Administered 2015-10-14: 10 mL via INTRAVENOUS
  Filled 2015-10-14: qty 10

## 2015-10-14 NOTE — Patient Instructions (Signed)

## 2015-10-17 ENCOUNTER — Ambulatory Visit (HOSPITAL_BASED_OUTPATIENT_CLINIC_OR_DEPARTMENT_OTHER): Payer: BLUE CROSS/BLUE SHIELD

## 2015-10-17 VITALS — BP 129/71 | HR 80 | Temp 98.5°F | Resp 17

## 2015-10-17 DIAGNOSIS — Z95828 Presence of other vascular implants and grafts: Secondary | ICD-10-CM

## 2015-10-17 DIAGNOSIS — C50411 Malignant neoplasm of upper-outer quadrant of right female breast: Secondary | ICD-10-CM

## 2015-10-17 DIAGNOSIS — Z452 Encounter for adjustment and management of vascular access device: Secondary | ICD-10-CM | POA: Diagnosis not present

## 2015-10-17 MED ORDER — ALTEPLASE 2 MG IJ SOLR
2.0000 mg | Freq: Once | INTRAMUSCULAR | Status: AC | PRN
  Administered 2015-10-17: 2 mg
  Filled 2015-10-17: qty 2

## 2015-10-17 MED ORDER — HEPARIN SOD (PORK) LOCK FLUSH 100 UNIT/ML IV SOLN
500.0000 [IU] | Freq: Once | INTRAVENOUS | Status: AC | PRN
  Administered 2015-10-17: 500 [IU] via INTRAVENOUS
  Filled 2015-10-17: qty 5

## 2015-10-17 MED ORDER — SODIUM CHLORIDE 0.9 % IJ SOLN
10.0000 mL | INTRAMUSCULAR | Status: DC | PRN
  Administered 2015-10-17: 10 mL via INTRAVENOUS
  Filled 2015-10-17: qty 10

## 2015-10-17 NOTE — Progress Notes (Signed)
NO blood return at the third check. Patient discussed with Ubaldo Glassing RN and will be flushed now and then discharged. 17ml of TPA removed prior to flush -no more able to be extraced.

## 2015-10-18 ENCOUNTER — Telehealth: Payer: Self-pay

## 2015-10-18 NOTE — Telephone Encounter (Signed)
LMOVM - identified voice mail.  I requested pt return call to clinic and ask for desk nurse.

## 2015-10-21 NOTE — Progress Notes (Signed)
Called pt to inform her of need to remove PAC per Dr. Lindi Adie.  Unable to reach pt so information left on VM.  Instructed pt to call us back so we could set up PAC removal by Dr. Excell Seltzer.

## 2016-01-16 ENCOUNTER — Encounter: Payer: Self-pay | Admitting: Hematology and Oncology

## 2016-01-16 ENCOUNTER — Ambulatory Visit (HOSPITAL_BASED_OUTPATIENT_CLINIC_OR_DEPARTMENT_OTHER): Payer: BLUE CROSS/BLUE SHIELD | Admitting: Hematology and Oncology

## 2016-01-16 DIAGNOSIS — C50411 Malignant neoplasm of upper-outer quadrant of right female breast: Secondary | ICD-10-CM | POA: Diagnosis not present

## 2016-01-16 DIAGNOSIS — Z79810 Long term (current) use of selective estrogen receptor modulators (SERMs): Secondary | ICD-10-CM

## 2016-01-16 NOTE — Assessment & Plan Note (Signed)
Right breast invasive ductal carcinoma multifocal disease ER/PR positive HER-2 negative T2, N1, M0 stage IIB status post mastectomy, adjuvant chemotherapy and radiation now on antiestrogen therapy with tamoxifen since October 2014.  Tamoxifen toxicities:Patient is tolerating tamoxifen extremely well without any major problems or concerns.  1. Hot flashes are manageable 2. Muscle aches and pains especially in the knees and lower extremities. Patient lost weight and since then her symptoms have improved markedly. She quit eating beef and pork. She is planning to substitute all the sweets with fruits.  Breast cancer Surveillance: Patient will need annual mammograms and breast exams.  1. Breast exam 01/10/2015 did not reveal any abnormalities. 2. Mammogram 07/26/14: Normal; category C density  Return to clinic in 1 year

## 2016-01-16 NOTE — Progress Notes (Signed)
Patient Care Team: Golden Circle, FNP as PCP - General (Family Medicine)  DIAGNOSIS:  Encounter Diagnosis  Name Primary?  . Primary cancer of upper outer quadrant of right female breast ([place])     SUMMARY OF ONCOLOGIC HISTORY:   Primary cancer of upper outer quadrant of right female breast ([place])   04/16/2012 Initial Biopsy    Right breast mass biopsy invasive ductal carcinoma with abundant mucin in all the 3 masses, right axillary lymph node biopsy positive for IDC, ER 100% PR percent Ki-67 19%, HER-2 negative ratio 0.94      04/21/2012 Breast MRI    Right breast 3 masses 11o'clock position 1.8 cm, now 11:00 position 2 cm, and 10:00 position 2.3 cm with an abnormal level I right axillary lymph node 3 cm      05/20/2012 Surgery    Right breast mastectomy invasive ductal carcinoma grade 2; 1.9 cm, 2.5 cm, 1.8 cm, 1/17 LN positive, second and third massive grade 3      06/24/2012 - 10/07/2012 Chemotherapy    Adjuvant chemotherapy with Taxotere Cytoxan x6 cycles      10/16/2012 - 12/16/2012 Radiation Therapy    Adjuvant radiation therapy by Dr. [name]      01/07/2013 -  Anti-estrogen oral therapy    Tamoxifen 20 mg daily       CHIEF COMPLIANT: Follow-up on tamoxifen therapy  INTERVAL HISTORY: Tasha Ortiz is a 60 year old with above-mentioned history of right breast cancer treated with adjuvant chemotherapy followed by radiation and is currently on tamoxifen. She is tolerating it extremely well. She does not have any hot flashes of significance. She does have mild hot flashes. Denies myalgias. Denies any lumps or nodules in the breast.  REVIEW OF SYSTEMS:   Constitutional: Denies fevers, chills or abnormal weight loss Eyes: Denies blurriness of vision Ears, nose, mouth, throat, and face: Denies mucositis or sore throat Respiratory: Denies cough, dyspnea or wheezes Cardiovascular: Denies palpitation, chest discomfort Gastrointestinal:  Denies nausea, heartburn or change  in bowel habits Skin: Denies abnormal skin rashes Lymphatics: Denies new lymphadenopathy or easy bruising Neurological:Denies numbness, tingling or new weaknesses Behavioral/Psych: Mood is stable, no new changes  Extremities: No lower extremity edema Breast:  denies any pain or lumps or nodules in either breasts All other systems were reviewed with the patient and are negative.  I have reviewed the past medical history, past surgical history, social history and family history with the patient and they are unchanged from previous note.  ALLERGIES:  is allergic to lisinopril.  MEDICATIONS:  Current Outpatient Prescriptions  Medication Sig Dispense Refill  . famotidine (PEPCID) 20 MG tablet Take 1 tablet (20 mg total) by mouth 2 (two) times daily as needed for heartburn or indigestion. 180 tablet 3  . hydrochlorothiazide (HYDRODIURIL) 25 MG tablet Take 1 tablet (25 mg total) by mouth daily. 90 tablet 3  . pravastatin (PRAVACHOL) 40 MG tablet Take 1 tablet (40 mg total) by mouth daily. 90 tablet 1  . tamoxifen (NOLVADEX) 20 MG tablet Take 1 tablet (20 mg total) by mouth daily. 90 tablet 3   No current facility-administered medications for this visit.     PHYSICAL EXAMINATION: ECOG PERFORMANCE STATUS: 1 - Symptomatic but completely ambulatory  Vitals:   01/16/16 1014  BP: (!) 158/81  Pulse: 64  Resp: 18  Temp: 98.4 F (36.9 C)   Filed Weights   01/16/16 1014  Weight: 168 lb (76.2 kg)    GENERAL:alert, no distress and comfortable  SKIN: skin color, texture, turgor are normal, no rashes or significant lesions EYES: normal, Conjunctiva are pink and non-injected, sclera clear OROPHARYNX:no exudate, no erythema and lips, buccal mucosa, and tongue normal  NECK: supple, thyroid normal size, non-tender, without nodularity LYMPH:  no palpable lymphadenopathy in the cervical, axillary or inguinal LUNGS: clear to auscultation and percussion with normal breathing effort HEART: regular  rate & rhythm and no murmurs and no lower extremity edema ABDOMEN:abdomen soft, non-tender and normal bowel sounds MUSCULOSKELETAL:no cyanosis of digits and no clubbing  NEURO: alert & oriented x 3 with fluent speech, no focal motor/sensory deficits EXTREMITIES: No lower extremity edema BREAST:Her No palpable masses or nodules in either right or left breasts. No palpable axillary supraclavicular or infraclavicular adenopathy no breast tenderness or nipple discharge. (exam performed in the presence of a chaperone)  LABORATORY DATA:  I have reviewed the data as listed   Chemistry      Component Value Date/Time   NA 143 07/28/2015 0957   NA 143 07/05/2014 1101   K 3.6 07/28/2015 0957   K 3.7 07/05/2014 1101   CL 105 07/28/2015 0957   CL 103 09/23/2012 1040   CO2 29 07/28/2015 0957   CO2 27 07/05/2014 1101   BUN 13 07/28/2015 0957   BUN 11.8 07/05/2014 1101   CREATININE 0.77 07/28/2015 0957   CREATININE 0.8 07/05/2014 1101      Component Value Date/Time   CALCIUM 10.3 07/28/2015 0957   CALCIUM 9.4 07/05/2014 1101   ALKPHOS 33 (L) 07/28/2015 0957   ALKPHOS 45 07/05/2014 1101   AST 17 07/28/2015 0957   AST 18 07/05/2014 1101   ALT 15 07/28/2015 0957   ALT 15 07/05/2014 1101   BILITOT 0.3 07/28/2015 0957   BILITOT 0.21 07/05/2014 1101       Lab Results  Component Value Date   WBC 4.2 07/28/2015   HGB 14.6 07/28/2015   HCT 43.2 07/28/2015   MCV 86.4 07/28/2015   PLT 195.0 07/28/2015   NEUTROABS 2.1 07/05/2014     ASSESSMENT & PLAN:  Primary cancer of upper outer quadrant of right female breast Right breast invasive ductal carcinoma multifocal disease ER/PR positive HER-2 negative T2, N1, M0 stage IIB status post mastectomy, adjuvant chemotherapy and radiation now on antiestrogen therapy with tamoxifen since October 2014.  Tamoxifen toxicities:Patient is tolerating tamoxifen extremely well without any major problems or concerns.  1. Hot flashes are manageable 2.  Muscle aches and pains especially in the knees and lower extremities. Patient lost weight and since then her symptoms have improved markedly. She quit eating beef and pork. She is planning to substitute all the sweets with fruits.  Breast cancer Surveillance: Patient will need annual mammograms and breast exams.  1. Breast exam 01/10/2015 did not reveal any abnormalities. 2. Mammogram 07/26/14: Normal; category C density  Return to clinic in 1 year   No orders of the defined types were placed in this encounter.  The patient has a good understanding of the overall plan. she agrees with it. she will call with any problems that may develop before the next visit here.   Rulon Eisenmenger, MD 01/16/16

## 2016-01-19 ENCOUNTER — Other Ambulatory Visit: Payer: Self-pay | Admitting: Family

## 2016-01-31 ENCOUNTER — Encounter: Payer: Self-pay | Admitting: Family

## 2016-01-31 ENCOUNTER — Ambulatory Visit (INDEPENDENT_AMBULATORY_CARE_PROVIDER_SITE_OTHER): Payer: BLUE CROSS/BLUE SHIELD | Admitting: Family

## 2016-01-31 ENCOUNTER — Ambulatory Visit: Payer: BLUE CROSS/BLUE SHIELD | Admitting: Internal Medicine

## 2016-01-31 VITALS — BP 132/88 | HR 80 | Temp 98.1°F | Resp 16 | Ht 64.0 in | Wt 173.0 lb

## 2016-01-31 DIAGNOSIS — J0140 Acute pansinusitis, unspecified: Secondary | ICD-10-CM

## 2016-01-31 DIAGNOSIS — Z23 Encounter for immunization: Secondary | ICD-10-CM

## 2016-01-31 DIAGNOSIS — C50411 Malignant neoplasm of upper-outer quadrant of right female breast: Secondary | ICD-10-CM | POA: Diagnosis not present

## 2016-01-31 DIAGNOSIS — J324 Chronic pansinusitis: Secondary | ICD-10-CM | POA: Insufficient documentation

## 2016-01-31 MED ORDER — PRAVASTATIN SODIUM 40 MG PO TABS: 40.0000 mg | ORAL_TABLET | Freq: Every day | ORAL | 1 refills | Status: DC

## 2016-01-31 MED ORDER — AMOXICILLIN-POT CLAVULANATE 875-125 MG PO TABS: 1.0000 | ORAL_TABLET | Freq: Two times a day (BID) | ORAL | 0 refills | Status: DC

## 2016-01-31 MED ORDER — TAMOXIFEN CITRATE 20 MG PO TABS: 20.0000 mg | ORAL_TABLET | Freq: Every day | ORAL | 3 refills | Status: DC

## 2016-01-31 NOTE — Patient Instructions (Addendum)
Thank you for choosing [facility].  SUMMARY AND INSTRUCTIONS:  Medication:  Your prescription(s) have been submitted to your pharmacy or been printed and provided for you. Please take as directed and contact our office if you believe you are having problem(s) with the medication(s) or have any questions.   Follow up:  If your symptoms worsen or fail to improve, please contact our office for further instruction, or in case of emergency go directly to the emergency room at the closest medical facility.    General Recommendations:    Please drink plenty of fluids.  Get plenty of rest   Sleep in humidified air  Use saline nasal sprays  Netti pot   OTC Medications:  Decongestants - helps relieve congestion   Flonase (generic fluticasone) or Nasacort (generic triamcinolone) - please make sure to use the "cross-over" technique at a 45 degree angle towards the opposite eye as opposed to straight up the nasal passageway.   Sudafed (generic pseudoephedrine - Note this is the one that is available behind the pharmacy counter); Products with phenylephrine (-PE) may also be used but is often not as effective as pseudoephedrine.   If you have HIGH BLOOD PRESSURE - Coricidin HBP; AVOID any product that is -D as this contains pseudoephedrine which may increase your blood pressure.  Afrin (oxymetazoline) every 6-8 hours for up to 3 days.   Allergies - helps relieve runny nose, itchy eyes and sneezing   Claritin (generic loratidine), Allegra (fexofenidine), or Zyrtec (generic cyrterizine) for runny nose. These medications should not cause drowsiness.  Note - Benadryl (generic diphenhydramine) may be used however may cause drowsiness  Cough -   Delsym or Robitussin (generic dextromethorphan)  Expectorants - helps loosen mucus to ease removal   Mucinex (generic guaifenesin) as directed on the package.  Headaches / General Aches   Tylenol (generic acetaminophen) - DO  NOT EXCEED 3 grams (3,000 mg) in a 24 hour time period  Advil/Motrin (generic ibuprofen)   Sore Throat -   Salt water gargle   Chloraseptic (generic benzocaine) spray or lozenges / Sucrets (generic dyclonine)    Sinusitis Sinusitis is redness, soreness, and inflammation of the paranasal sinuses. Paranasal sinuses are air pockets within the bones of your face (beneath the eyes, the middle of the forehead, or above the eyes). In healthy paranasal sinuses, mucus is able to drain out, and air is able to circulate through them by way of your nose. However, when your paranasal sinuses are inflamed, mucus and air can become trapped. This can allow bacteria and other germs to grow and cause infection. Sinusitis can develop quickly and last only a short time (acute) or continue over a long period (chronic). Sinusitis that lasts for more than 12 weeks is considered chronic.  CAUSES  Causes of sinusitis include:  Allergies.  Structural abnormalities, such as displacement of the cartilage that separates your nostrils (deviated septum), which can decrease the air flow through your nose and sinuses and affect sinus drainage.  Functional abnormalities, such as when the small hairs (cilia) that line your sinuses and help remove mucus do not work properly or are not present. SIGNS AND SYMPTOMS  Symptoms of acute and chronic sinusitis are the same. The primary symptoms are pain and pressure around the affected sinuses. Other symptoms include:  Upper toothache.  Earache.  Headache.  Bad breath.  Decreased sense of smell and taste.  A cough, which worsens when you are lying flat.  Fatigue.  Fever.  Thick drainage   from your nose, which often is green and may contain pus (purulent).  Swelling and warmth over the affected sinuses. DIAGNOSIS  Your health care provider will perform a physical exam. During the exam, your health care provider may:  Look in your nose for signs of abnormal growths  in your nostrils (nasal polyps).  Tap over the affected sinus to check for signs of infection.  View the inside of your sinuses (endoscopy) using an imaging device that has a light attached (endoscope). If your health care provider suspects that you have chronic sinusitis, one or more of the following tests may be recommended:  Allergy tests.  Nasal culture. A sample of mucus is taken from your nose, sent to a lab, and screened for bacteria.  Nasal cytology. A sample of mucus is taken from your nose and examined by your health care provider to determine if your sinusitis is related to an allergy. TREATMENT  Most cases of acute sinusitis are related to a viral infection and will resolve on their own within 10 days. Sometimes medicines are prescribed to help relieve symptoms (pain medicine, decongestants, nasal steroid sprays, or saline sprays).  However, for sinusitis related to a bacterial infection, your health care provider will prescribe antibiotic medicines. These are medicines that will help kill the bacteria causing the infection.  Rarely, sinusitis is caused by a fungal infection. In theses cases, your health care provider will prescribe antifungal medicine. For some cases of chronic sinusitis, surgery is needed. Generally, these are cases in which sinusitis recurs more than 3 times per year, despite other treatments. HOME CARE INSTRUCTIONS   Drink plenty of water. Water helps thin the mucus so your sinuses can drain more easily.  Use a humidifier.  Inhale steam 3 to 4 times a day (for example, sit in the bathroom with the shower running).  Apply a warm, moist washcloth to your face 3 to 4 times a day, or as directed by your health care provider.  Use saline nasal sprays to help moisten and clean your sinuses.  Take medicines only as directed by your health care provider.  If you were prescribed either an antibiotic or antifungal medicine, finish it all even if you start to feel  better. SEEK IMMEDIATE MEDICAL CARE IF:  You have increasing pain or severe headaches.  You have nausea, vomiting, or drowsiness.  You have swelling around your face.  You have vision problems.  You have a stiff neck.  You have difficulty breathing. MAKE SURE YOU:   Understand these instructions.  Will watch your condition.  Will get help right away if you are not doing well or get worse. Document Released: 03/19/2005 Document Revised: 08/03/2013 Document Reviewed: 04/03/2011 ExitCare Patient Information 2015 [employer]. This information is not intended to replace advice given to you by your health care provider. Make sure you discuss any questions you have with your health care provider.   

## 2016-01-31 NOTE — Assessment & Plan Note (Signed)
Symptoms and exam consistent with pansinusitis given most recent improvements cannot rule out viral infection. Treat conservatively with over-the-counter medications as needed for symptom relief and supportive care. Start Augmentin if symptoms worsen or do not improve with watchful waiting instructions provided. Patient in agreement.

## 2016-01-31 NOTE — Progress Notes (Signed)
Subjective:    Patient ID: Tasha Ortiz, female    DOB: December 21, 1955, 60 y.o.   MRN: [id number]  Chief Complaint  Patient presents with  . Sinus pressure    sinus headache and pressure, congestion, x4 days    HPI:  Tasha Ortiz is a 60 y.o. female who  has a past medical history of Acid reflux; Allergy; Breast cancer ([place]); Heartburn; Hypercholesteremia; Hypertension; and Hypertension. and presents today for an acute office visit.   This is a new problem. Associated symptoms of sinus headache, pressure and congestion have been going on for several days. Denies fevers. Modifying factors include a saline nasal sprays and tylenol which has not helped very much. Vinegar and water seemed to help a little as well. No recent antibiotics. Course of the symptoms are slightly improved since initial onset.   Allergies  Allergen Reactions  . Lisinopril Swelling      Outpatient Medications Prior to Visit  Medication Sig Dispense Refill  . famotidine (PEPCID) 20 MG tablet Take 1 tablet (20 mg total) by mouth 2 (two) times daily as needed for heartburn or indigestion. 180 tablet 3  . hydrochlorothiazide (HYDRODIURIL) 25 MG tablet TAKE 1 TABLET BY MOUTH EVERY DAY 90 tablet 3  . pravastatin (PRAVACHOL) 40 MG tablet Take 1 tablet (40 mg total) by mouth daily. 90 tablet 1  . tamoxifen (NOLVADEX) 20 MG tablet Take 1 tablet (20 mg total) by mouth daily. 90 tablet 3   No facility-administered medications prior to visit.       Review of Systems  Constitutional: Negative for chills and fever.  HENT: Positive for congestion and sore throat.   Respiratory: Negative for cough, chest tightness and shortness of breath.   Neurological: Positive for headaches.      Objective:    BP 132/88 (BP Location: Left Arm, Patient Position: Sitting, Cuff Size: Normal)   Pulse 80   Temp 98.1 F (36.7 C) (Oral)   Resp 16   Ht 5\' 4"  (1.626 m)   Wt 173 lb (78.5 kg)   SpO2 95%   BMI 29.70 kg/m    Nursing note and vital signs reviewed.  Physical Exam  Constitutional: She is oriented to person, place, and time. She appears well-developed and well-nourished. No distress.  HENT:  Right Ear: Hearing, tympanic membrane, external ear and ear canal normal.  Left Ear: Hearing, tympanic membrane, external ear and ear canal normal.  Nose: Right sinus exhibits maxillary sinus tenderness and frontal sinus tenderness. Left sinus exhibits maxillary sinus tenderness and frontal sinus tenderness.  Mouth/Throat: Uvula is midline, oropharynx is clear and moist and mucous membranes are normal.  Cardiovascular: Normal rate, regular rhythm, normal heart sounds and intact distal pulses.   Pulmonary/Chest: Effort normal and breath sounds normal.  Neurological: She is alert and oriented to person, place, and time.  Skin: Skin is warm and dry.  Psychiatric: She has a normal mood and affect. Her behavior is normal. Judgment and thought content normal.       Assessment & Plan:   Problem List Items Addressed This Visit      Respiratory   Pansinusitis - Primary    Symptoms and exam consistent with pansinusitis given most recent improvements cannot rule out viral infection. Treat conservatively with over-the-counter medications as needed for symptom relief and supportive care. Start Augmentin if symptoms worsen or do not improve with watchful waiting instructions provided. Patient in agreement.      Relevant Medications   amoxicillin-clavulanate (  AUGMENTIN) 875-125 MG tablet     Other   Primary cancer of upper outer quadrant of right female breast ([facility])   Relevant Medications   tamoxifen (NOLVADEX) 20 MG tablet   amoxicillin-clavulanate (AUGMENTIN) 875-125 MG tablet    Other Visit Diagnoses    Encounter for immunization       Relevant Orders   Flu Vaccine QUAD 36+ mos IM (Completed)       I am having Ms. Kabir start on amoxicillin-clavulanate. I am also having her maintain her famotidine,  hydrochlorothiazide, tamoxifen, and pravastatin.   Meds ordered this encounter  Medications  . tamoxifen (NOLVADEX) 20 MG tablet    Sig: Take 1 tablet (20 mg total) by mouth daily.    Dispense:  90 tablet    Refill:  3  . pravastatin (PRAVACHOL) 40 MG tablet    Sig: Take 1 tablet (40 mg total) by mouth daily.    Dispense:  90 tablet    Refill:  1  . amoxicillin-clavulanate (AUGMENTIN) 875-125 MG tablet    Sig: Take 1 tablet by mouth 2 (two) times daily.    Dispense:  20 tablet    Refill:  0    Order Specific Question:   Supervising Provider    Answer:   Pricilla Holm A [id number]     Follow-up: Return if symptoms worsen or fail to improve.  Mauricio Po, FNP

## 2016-03-28 ENCOUNTER — Encounter: Payer: Self-pay | Admitting: Hematology and Oncology

## 2016-04-06 ENCOUNTER — Other Ambulatory Visit: Payer: Self-pay | Admitting: Nurse Practitioner

## 2016-04-14 ENCOUNTER — Ambulatory Visit (INDEPENDENT_AMBULATORY_CARE_PROVIDER_SITE_OTHER): Payer: BLUE CROSS/BLUE SHIELD | Admitting: Family

## 2016-04-14 ENCOUNTER — Encounter: Payer: Self-pay | Admitting: Family

## 2016-04-14 VITALS — BP 134/80 | HR 77 | Temp 98.2°F | Ht 64.0 in | Wt 175.0 lb

## 2016-04-14 DIAGNOSIS — J209 Acute bronchitis, unspecified: Secondary | ICD-10-CM | POA: Diagnosis not present

## 2016-04-14 MED ORDER — BENZONATATE 100 MG PO CAPS: 100.0000 mg | ORAL_CAPSULE | Freq: Two times a day (BID) | ORAL | 0 refills | Status: DC | PRN

## 2016-04-14 NOTE — Progress Notes (Signed)
Subjective:    Patient ID: Tasha Ortiz, female    DOB: 1955-04-07, 61 y.o.   MRN: [id number]  CC: Tasha Ortiz is a 61 y.o. female who presents today for an acute visit.    HPI: CC: productive cough x 3 weeks, improving. Started mucinex, castor oil, cough syrup with some relief. Endorses nasal congestion.   No wheezing, SOB, fever, chills, sinus pressure, sore throat, chest pain.    H/o GERD. Takes pepcid with relief. Unsure of which foods  NO lung disease. Non smoker.         HISTORY:  Past Medical History:  Diagnosis Date  . Acid reflux   . Allergy    seasonal  . Breast cancer ([place])    right,no iv or blood on right sid, chemo and radiation 2014  . Heartburn   . Hypercholesteremia    taken off chol meds Dec 2012  . Hypertension   . Hypertension    Past Surgical History:  Procedure Laterality Date  . BREAST SURGERY Right 05/20/12   Rt br mastectomy  . CESAREAN SECTION  25 years ago  . colonoscopy    . COLONOSCOPY WITH PROPOFOL N/A 01/18/2015   Procedure: COLONOSCOPY WITH PROPOFOL;  Surgeon: Mauri Pole, MD;  Location: [facility];  Service: Endoscopy;  Laterality: N/A;  . MASTECTOMY Right   . MODIFIED MASTECTOMY Right 05/20/2012   Procedure: MODIFIED MASTECTOMY;  Surgeon: Edward Jolly, MD;  Location: [facility];  Service: General;  Laterality: Right;  . PORTACATH PLACEMENT Left 05/20/2012   Procedure: INSERTION PORT-A-CATH;  Surgeon: Edward Jolly, MD;  Location: [place];  Service: General;  Laterality: Left;  . TUBAL LIGATION     Family History  Problem Relation Age of Onset  . Hypertension Mother   . Cancer Sister     brain  . Cancer Cousin     breast  . Healthy Father   . Healthy Maternal Grandmother   . Healthy Maternal Grandfather   . Healthy Paternal Grandmother   . Healthy Paternal Grandfather   . Diabetes Daughter   . Colon cancer Neg Hx     Allergies: Lisinopril Current Outpatient Prescriptions on File Prior to Visit    Medication Sig Dispense Refill  . amoxicillin-clavulanate (AUGMENTIN) 875-125 MG tablet Take 1 tablet by mouth 2 (two) times daily. 20 tablet 0  . famotidine (PEPCID) 20 MG tablet Take 1 tablet (20 mg total) by mouth 2 (two) times daily as needed for heartburn or indigestion. 180 tablet 3  . hydrochlorothiazide (HYDRODIURIL) 25 MG tablet TAKE 1 TABLET BY MOUTH EVERY DAY 90 tablet 3  . pravastatin (PRAVACHOL) 40 MG tablet Take 1 tablet (40 mg total) by mouth daily. 90 tablet 1  . tamoxifen (NOLVADEX) 20 MG tablet Take 1 tablet (20 mg total) by mouth daily. 90 tablet 3  . [DISCONTINUED] Prochlorperazine Maleate (COMPAZINE PO) Take by mouth.     No current facility-administered medications on file prior to visit.     Social History  Substance Use Topics  . Smoking status: Never Smoker  . Smokeless tobacco: Former Systems developer    Types: Chew    Quit date: 11/18/1996  . Alcohol use No    Review of Systems  Constitutional: Negative for chills and fever.  HENT: Positive for sinus pressure. Negative for congestion, ear pain, facial swelling, sinus pain and sore throat.   Respiratory: Positive for cough. Negative for shortness of breath and wheezing.   Cardiovascular: Negative for chest pain  and palpitations.  Gastrointestinal: Negative for nausea and vomiting.      Objective:    BP 134/80   Pulse 77   Temp 98.2 F (36.8 C) (Oral)   Ht 5\' 4"  (1.626 m)   Wt 175 lb (79.4 kg)   SpO2 98%   BMI 30.04 kg/m    Physical Exam  Constitutional: She appears well-developed and well-nourished.  HENT:  Head: Normocephalic and atraumatic.  Right Ear: Hearing, tympanic membrane, external ear and ear canal normal. No drainage, swelling or tenderness. No foreign bodies. Tympanic membrane is not erythematous and not bulging. No middle ear effusion. No decreased hearing is noted.  Left Ear: Hearing, tympanic membrane, external ear and ear canal normal. No drainage, swelling or tenderness. No foreign bodies.  Tympanic membrane is not erythematous and not bulging.  No middle ear effusion. No decreased hearing is noted.  Nose: No rhinorrhea. Right sinus exhibits frontal sinus tenderness. Right sinus exhibits no maxillary sinus tenderness. Left sinus exhibits frontal sinus tenderness. Left sinus exhibits no maxillary sinus tenderness.  Mouth/Throat: Uvula is midline, oropharynx is clear and moist and mucous membranes are normal. No oropharyngeal exudate, posterior oropharyngeal edema, posterior oropharyngeal erythema or tonsillar abscesses.  Slight frontal tenderness  Eyes: Conjunctivae are normal.  Cardiovascular: Regular rhythm, normal heart sounds and normal pulses.   Pulmonary/Chest: Effort normal and breath sounds normal. She has no wheezes. She has no rhonchi. She has no rales.  Lymphadenopathy:       Head (right side): No submental, no submandibular, no tonsillar, no preauricular, no posterior auricular and no occipital adenopathy present.       Head (left side): No submental, no submandibular, no tonsillar, no preauricular, no posterior auricular and no occipital adenopathy present.    She has no cervical adenopathy.  Neurological: She is alert.  Skin: Skin is warm and dry.  Psychiatric: She has a normal mood and affect. Her speech is normal and behavior is normal. Thought content normal.  Vitals reviewed.      Assessment & Plan:   1. Acute bronchitis, unspecified organism Afebrile. Reassured as patient has improved. Sa02 98. We agreed to continue mucinex for sinus pressure and to start PRN tessalon. If she doesn't continue to improve, she will let me know.   - benzonatate (TESSALON) 100 MG capsule; Take 1 capsule (100 mg total) by mouth 2 (two) times daily as needed for cough.  Dispense: 20 capsule; Refill: 0    I am having Tasha Ortiz maintain her famotidine, hydrochlorothiazide, tamoxifen, pravastatin, and amoxicillin-clavulanate.   No orders of the defined types were placed in this  encounter.   Return precautions given.   Risks, benefits, and alternatives of the medications and treatment plan prescribed today were discussed, and patient expressed understanding.   Education regarding symptom management and diagnosis given to patient on AVS.  Continue to follow with Mauricio Po, FNP for routine health maintenance.   Tasha Ortiz and I agreed with plan.   Mable Paris, FNP

## 2016-04-14 NOTE — Patient Instructions (Signed)
So glad you are getting better.   Let me know if you do not continue to do so.   Increase intake of clear fluids. Congestion is best treated by hydration, when mucus is wetter, it is thinner, less sticky, and easier to expel from the body, either through coughing up drainage, or by blowing your nose.   Get plenty of rest.   Use saline nasal drops and blow your nose frequently. Run a humidifier at night and elevate the head of the bed. Vicks Vapor rub will help with congestion and cough. Steam showers and sinus massage for congestion.   Use Acetaminophen or Ibuprofen as needed for fever or pain. Avoid second hand smoke. Even the smallest exposure will worsen symptoms.   Over the counter medications you can try include Delsym for cough, a decongestant for congestion, and Mucinex or Robitussin as an expectorant. Be sure to just get the plain Mucinex or Robitussin that just has one medication (Guaifenesen). We don't recommend the combination products. Note, be sure to drink two glasses of water with each dose of Mucinex as the medication will not work well without adequate hydration.   You can also try a teaspoon of honey to see if this will help reduce cough. Throat lozenges can sometimes be beneficial as well.    This illness will typically last 7 - 10 days.   Please follow up with our clinic if you develop a fever greater than 101 F, symptoms worsen, or do not resolve in the next week.

## 2016-04-14 NOTE — Progress Notes (Signed)
Pre visit review using our clinic review tool, if applicable. No additional management support is needed unless otherwise documented below in the visit note. 

## 2016-04-16 ENCOUNTER — Ambulatory Visit: Payer: BLUE CROSS/BLUE SHIELD | Admitting: Family

## 2016-05-07 ENCOUNTER — Encounter: Payer: BLUE CROSS/BLUE SHIELD | Admitting: Family

## 2016-05-10 ENCOUNTER — Encounter: Payer: Self-pay | Admitting: Nurse Practitioner

## 2016-05-10 ENCOUNTER — Ambulatory Visit (INDEPENDENT_AMBULATORY_CARE_PROVIDER_SITE_OTHER): Payer: BLUE CROSS/BLUE SHIELD | Admitting: Nurse Practitioner

## 2016-05-10 VITALS — BP 136/82 | HR 79 | Temp 98.3°F | Ht 64.0 in | Wt 166.0 lb

## 2016-05-10 DIAGNOSIS — J309 Allergic rhinitis, unspecified: Secondary | ICD-10-CM

## 2016-05-10 DIAGNOSIS — J0140 Acute pansinusitis, unspecified: Secondary | ICD-10-CM

## 2016-05-10 DIAGNOSIS — J019 Acute sinusitis, unspecified: Secondary | ICD-10-CM | POA: Insufficient documentation

## 2016-05-10 MED ORDER — DM-GUAIFENESIN ER 30-600 MG PO TB12: 1.0000 | ORAL_TABLET | Freq: Two times a day (BID) | ORAL | 0 refills | Status: DC | PRN

## 2016-05-10 MED ORDER — FLUTICASONE PROPIONATE 50 MCG/ACT NA SUSP: 2.0000 | Freq: Every day | NASAL | 0 refills | Status: DC

## 2016-05-10 MED ORDER — AZITHROMYCIN 250 MG PO TABS: 250.0000 mg | ORAL_TABLET | Freq: Every day | ORAL | 0 refills | Status: DC

## 2016-05-10 MED ORDER — OXYMETAZOLINE HCL 0.05 % NA SOLN: 1.0000 | Freq: Two times a day (BID) | NASAL | 0 refills | Status: DC

## 2016-05-10 NOTE — Patient Instructions (Signed)
[facility] Instructions: Flonase and Afrin use: apply 1spray of afrin in each nare, wait 37mins, then apply 2sprays of flonase in each nare. Use both nasal spray consecutively x 3days, then flonase only for at least 14days.  Encourage adequate oral hydration.  Use over-the-counter  "cold" medicines  such as "Tylenol cold" , "Advil cold",  "Mucinex" or" Mucinex DM"  for cough and congestion.  Avoid decongestants if you have high blood pressure. Use" Delsym" or" Robitussin" cough syrup varietis for cough.  You can use plain "Tylenol" or "Advi"l for fever, chills and achyness.

## 2016-05-10 NOTE — Progress Notes (Signed)
Pre visit review using our clinic review tool, if applicable. No additional management support is needed unless otherwise documented below in the visit note. 

## 2016-05-10 NOTE — Progress Notes (Signed)
Subjective:  Patient ID: Tasha Ortiz, female    DOB: 12-Jul-1955  Age: 61 y.o. MRN: [id number]  CC: Sinus Problem (sinus headache,sore throat at time,bodyache going on since 1 wk. took mucus relieve,all day sinus cold. )   Sinusitis  This is a new problem. The current episode started 1 to 4 weeks ago. The problem has been waxing and waning since onset. Associated symptoms include chills, congestion, coughing, headaches, a hoarse voice, sinus pressure, a sore throat and swollen glands. Pertinent negatives include no shortness of breath. Past treatments include oral decongestants, acetaminophen and saline sprays. The treatment provided no relief.    Outpatient Medications Prior to Visit  Medication Sig Dispense Refill  . famotidine (PEPCID) 20 MG tablet Take 1 tablet (20 mg total) by mouth 2 (two) times daily as needed for heartburn or indigestion. 180 tablet 3  . hydrochlorothiazide (HYDRODIURIL) 25 MG tablet TAKE 1 TABLET BY MOUTH EVERY DAY 90 tablet 3  . pravastatin (PRAVACHOL) 40 MG tablet Take 1 tablet (40 mg total) by mouth daily. 90 tablet 1  . tamoxifen (NOLVADEX) 20 MG tablet Take 1 tablet (20 mg total) by mouth daily. 90 tablet 3  . amoxicillin-clavulanate (AUGMENTIN) 875-125 MG tablet Take 1 tablet by mouth 2 (two) times daily. (Patient not taking: Reported on 05/10/2016) 20 tablet 0  . benzonatate (TESSALON) 100 MG capsule Take 1 capsule (100 mg total) by mouth 2 (two) times daily as needed for cough. (Patient not taking: Reported on 05/10/2016) 20 capsule 0   No facility-administered medications prior to visit.     ROS See HPI  Objective:  BP 136/82   Pulse 79   Temp 98.3 F (36.8 C)   Ht 5\' 4"  (1.626 m)   Wt 166 lb (75.3 kg)   SpO2 98%   BMI 28.49 kg/m   BP Readings from Last 3 Encounters:  05/10/16 136/82  04/14/16 134/80  01/31/16 132/88    Wt Readings from Last 3 Encounters:  05/10/16 166 lb (75.3 kg)  04/14/16 175 lb (79.4 kg)  01/31/16 173 lb (78.5 kg)     Physical Exam  Constitutional: She is oriented to person, place, and time.  HENT:  Right Ear: Tympanic membrane, external ear and ear canal normal.  Left Ear: Tympanic membrane, external ear and ear canal normal.  Nose: Mucosal edema and rhinorrhea present. Right sinus exhibits maxillary sinus tenderness. Right sinus exhibits no frontal sinus tenderness. Left sinus exhibits maxillary sinus tenderness. Left sinus exhibits no frontal sinus tenderness.  Mouth/Throat: Uvula is midline. No trismus in the jaw. Posterior oropharyngeal erythema present. No oropharyngeal exudate.  Eyes: No scleral icterus.  Neck: Normal range of motion. Neck supple.  Cardiovascular: Normal rate and normal heart sounds.   Pulmonary/Chest: Effort normal and breath sounds normal.  Musculoskeletal: She exhibits no edema.  Lymphadenopathy:    She has cervical adenopathy.  Neurological: She is alert and oriented to person, place, and time.  Vitals reviewed.   Lab Results  Component Value Date   WBC 4.2 07/28/2015   HGB 14.6 07/28/2015   HCT 43.2 07/28/2015   PLT 195.0 07/28/2015   GLUCOSE 101 (H) 07/28/2015   CHOL 174 11/04/2014   TRIG 118.0 11/04/2014   HDL 45.30 11/04/2014   LDLDIRECT 111 (H) 03/10/2012   LDLCALC 105 (H) 11/04/2014   ALT 15 07/28/2015   AST 17 07/28/2015   NA 143 07/28/2015   K 3.6 07/28/2015   CL 105 07/28/2015   CREATININE 0.77 07/28/2015  BUN 13 07/28/2015   CO2 29 07/28/2015   TSH 5.11 (H) 07/28/2015   HGBA1C 6.2 (H) 04/27/2013    Mm Screening Breast Tomo Uni L  Result Date: 08/01/2015 CLINICAL DATA:  Screening. Malignant right mastectomy in 2014. EXAM: 2D DIGITAL SCREENING UNILATERAL LEFT MAMMOGRAM WITH CAD AND ADJUNCT TOMO COMPARISON:  Previous exam(s). ACR Breast Density Category c: The breast tissue is heterogeneously dense, which may obscure small masses. FINDINGS: The patient has had a right mastectomy. There are no findings suspicious for malignancy in the left breast.  Images were processed with CAD. IMPRESSION: No mammographic evidence of malignancy. A result letter of this screening mammogram will be mailed directly to the patient. RECOMMENDATION: Screening mammogram in one year.  (Code:[id number]) BI-RADS CATEGORY  1: Negative. Electronically Signed   By: Evangeline Dakin M.D.   On: 08/02/2015 09:59    Assessment & Plan:   Tasha Ortiz was seen today for sinus problem.  Diagnoses and all orders for this visit:  Acute non-recurrent pansinusitis -     azithromycin (ZITHROMAX Z-PAK) 250 MG tablet; Take 1 tablet (250 mg total) by mouth daily. Take 2tabs on first day, then 1tab once a day till complete -     dextromethorphan-guaiFENesin (MUCINEX DM) 30-600 MG 12hr tablet; Take 1 tablet by mouth 2 (two) times daily as needed for cough. -     fluticasone (FLONASE) 50 MCG/ACT nasal spray; Place 2 sprays into both nostrils daily. -     oxymetazoline (AFRIN NASAL SPRAY) 0.05 % nasal spray; Place 1 spray into both nostrils 2 (two) times daily. Use only for 3days, then stop  Allergic rhinitis, unspecified chronicity, unspecified seasonality, unspecified trigger -     azithromycin (ZITHROMAX Z-PAK) 250 MG tablet; Take 1 tablet (250 mg total) by mouth daily. Take 2tabs on first day, then 1tab once a day till complete -     dextromethorphan-guaiFENesin (MUCINEX DM) 30-600 MG 12hr tablet; Take 1 tablet by mouth 2 (two) times daily as needed for cough. -     fluticasone (FLONASE) 50 MCG/ACT nasal spray; Place 2 sprays into both nostrils daily. -     oxymetazoline (AFRIN NASAL SPRAY) 0.05 % nasal spray; Place 1 spray into both nostrils 2 (two) times daily. Use only for 3days, then stop   I have discontinued Ms. Dashner's amoxicillin-clavulanate and benzonatate. I am also having her start on azithromycin, dextromethorphan-guaiFENesin, fluticasone, and oxymetazoline. Additionally, I am having her maintain her famotidine, hydrochlorothiazide, tamoxifen, and pravastatin.  Meds  ordered this encounter  Medications  . azithromycin (ZITHROMAX Z-PAK) 250 MG tablet    Sig: Take 1 tablet (250 mg total) by mouth daily. Take 2tabs on first day, then 1tab once a day till complete    Dispense:  6 tablet    Refill:  0    Order Specific Question:   Supervising Provider    Answer:   Cassandria Anger [[id number]]  . dextromethorphan-guaiFENesin (MUCINEX DM) 30-600 MG 12hr tablet    Sig: Take 1 tablet by mouth 2 (two) times daily as needed for cough.    Dispense:  14 tablet    Refill:  0    Order Specific Question:   Supervising Provider    Answer:   Cassandria Anger [[id number]]  . fluticasone (FLONASE) 50 MCG/ACT nasal spray    Sig: Place 2 sprays into both nostrils daily.    Dispense:  16 g    Refill:  0    Order Specific Question:   Supervising Provider  Answer:   Cassandria Anger [[id number]]  . oxymetazoline (AFRIN NASAL SPRAY) 0.05 % nasal spray    Sig: Place 1 spray into both nostrils 2 (two) times daily. Use only for 3days, then stop    Dispense:  30 mL    Refill:  0    Order Specific Question:   Supervising Provider    Answer:   Cassandria Anger [[id number]]    Follow-up: Return if symptoms worsen or fail to improve.  Wilfred Lacy, NP

## 2016-05-23 ENCOUNTER — Other Ambulatory Visit: Payer: Self-pay | Admitting: Hematology and Oncology

## 2016-05-23 DIAGNOSIS — Z1231 Encounter for screening mammogram for malignant neoplasm of breast: Secondary | ICD-10-CM

## 2016-05-23 DIAGNOSIS — Z9011 Acquired absence of right breast and nipple: Secondary | ICD-10-CM

## 2016-07-03 ENCOUNTER — Other Ambulatory Visit: Payer: Self-pay

## 2016-07-03 DIAGNOSIS — J0140 Acute pansinusitis, unspecified: Secondary | ICD-10-CM

## 2016-07-03 DIAGNOSIS — J309 Allergic rhinitis, unspecified: Secondary | ICD-10-CM

## 2016-07-03 MED ORDER — FLUTICASONE PROPIONATE 50 MCG/ACT NA SUSP: 2.0000 | Freq: Every day | NASAL | 0 refills | Status: DC

## 2016-07-17 ENCOUNTER — Ambulatory Visit (INDEPENDENT_AMBULATORY_CARE_PROVIDER_SITE_OTHER): Payer: BLUE CROSS/BLUE SHIELD | Admitting: Family

## 2016-07-17 ENCOUNTER — Encounter: Payer: Self-pay | Admitting: Family

## 2016-07-17 VITALS — BP 138/90 | HR 65 | Temp 98.4°F | Resp 16 | Ht 64.0 in | Wt 164.8 lb

## 2016-07-17 DIAGNOSIS — I10 Essential (primary) hypertension: Secondary | ICD-10-CM | POA: Diagnosis not present

## 2016-07-17 MED ORDER — AMLODIPINE BESYLATE 5 MG PO TABS
5.0000 mg | ORAL_TABLET | Freq: Every day | ORAL | 0 refills | Status: DC
Start: 2016-07-17 — End: 2016-10-11

## 2016-07-17 NOTE — Patient Instructions (Signed)
Thank you for choosing [facility].  SUMMARY AND INSTRUCTIONS:  Continue to take the hydrochlorothiazide as prescribed.  Start amlodipine.  Check blood pressure at home and send numbers in 2 weeks.   Watch your sodium intake.   Medication:  Your prescription(s) have been submitted to your pharmacy or been printed and provided for you. Please take as directed and contact our office if you believe you are having problem(s) with the medication(s) or have any questions.  Follow up:  If your symptoms worsen or fail to improve, please contact our office for further instruction, or in case of emergency go directly to the emergency room at the closest medical facility.     DASH Eating Plan DASH stands for "Dietary Approaches to Stop Hypertension." The DASH eating plan is a healthy eating plan that has been shown to reduce high blood pressure (hypertension). It may also reduce your risk for type 2 diabetes, heart disease, and stroke. The DASH eating plan may also help with weight loss. What are tips for following this plan? General guidelines   Avoid eating more than 2,300 mg (milligrams) of salt (sodium) a day. If you have hypertension, you may need to reduce your sodium intake to 1,500 mg a day.  Limit alcohol intake to no more than 1 drink a day for nonpregnant women and 2 drinks a day for men. One drink equals 12 oz of beer, 5 oz of wine, or 1 oz of hard liquor.  Work with your health care provider to maintain a healthy body weight or to lose weight. Ask what an ideal weight is for you.  Get at least 30 minutes of exercise that causes your heart to beat faster (aerobic exercise) most days of the week. Activities may include walking, swimming, or biking.  Work with your health care provider or diet and nutrition specialist (dietitian) to adjust your eating plan to your individual calorie needs. Reading food labels   Check food labels for the amount of sodium per serving.  Choose foods with less than 5 percent of the Daily Value of sodium. Generally, foods with less than 300 mg of sodium per serving fit into this eating plan.  To find whole grains, look for the word "whole" as the first word in the ingredient list. Shopping   Buy products labeled as "low-sodium" or "no salt added."  Buy fresh foods. Avoid canned foods and premade or frozen meals. Cooking   Avoid adding salt when cooking. Use salt-free seasonings or herbs instead of table salt or sea salt. Check with your health care provider or pharmacist before using salt substitutes.  Do not fry foods. Cook foods using healthy methods such as baking, boiling, grilling, and broiling instead.  Cook with heart-healthy oils, such as olive, canola, soybean, or sunflower oil. Meal planning    Eat a balanced diet that includes:  5 or more servings of fruits and vegetables each day. At each meal, try to fill half of your plate with fruits and vegetables.  Up to 6-8 servings of whole grains each day.  Less than 6 oz of lean meat, poultry, or fish each day. A 3-oz serving of meat is about the same size as a deck of cards. One egg equals 1 oz.  2 servings of low-fat dairy each day.  A serving of nuts, seeds, or beans 5 times each week.  Heart-healthy fats. Healthy fats called Omega-3 fatty acids are found in foods such as flaxseeds and coldwater fish, like sardines, salmon,  and mackerel.  Limit how much you eat of the following:  Canned or prepackaged foods.  Food that is high in trans fat, such as fried foods.  Food that is high in saturated fat, such as fatty meat.  Sweets, desserts, sugary drinks, and other foods with added sugar.  Full-fat dairy products.  Do not salt foods before eating.  Try to eat at least 2 vegetarian meals each week.  Eat more home-cooked food and less restaurant, buffet, and fast food.  When eating at a restaurant, ask that your food be prepared with less salt or no  salt, if possible. What foods are recommended? The items listed may not be a complete list. Talk with your dietitian about what dietary choices are best for you. Grains  Whole-grain or whole-wheat bread. Whole-grain or whole-wheat pasta. Brown rice. [place]. Bulgur. Whole-grain and low-sodium cereals. Pita bread. Low-fat, low-sodium crackers. Whole-wheat flour tortillas. Vegetables  Fresh or frozen vegetables (raw, steamed, roasted, or grilled). Low-sodium or reduced-sodium tomato and vegetable juice. Low-sodium or reduced-sodium tomato sauce and tomato paste. Low-sodium or reduced-sodium canned vegetables. Fruits  All fresh, dried, or frozen fruit. Canned fruit in natural juice (without added sugar). Meat and other protein foods  Skinless chicken or [place]. Ground chicken or [place]. Pork with fat trimmed off. Fish and seafood. Egg whites. Dried beans, peas, or lentils. Unsalted nuts, nut butters, and seeds. Unsalted canned beans. Lean cuts of beef with fat trimmed off. Low-sodium, lean deli meat. Dairy  Low-fat (1%) or fat-free (skim) milk. Fat-free, low-fat, or reduced-fat cheeses. Nonfat, low-sodium ricotta or cottage cheese. Low-fat or nonfat yogurt. Low-fat, low-sodium cheese. Fats and oils  Soft margarine without trans fats. Vegetable oil. Low-fat, reduced-fat, or light mayonnaise and salad dressings (reduced-sodium). Canola, safflower, olive, soybean, and sunflower oils. Avocado. Seasoning and other foods  Herbs. Spices. Seasoning mixes without salt. Unsalted popcorn and pretzels. Fat-free sweets. What foods are not recommended? The items listed may not be a complete list. Talk with your dietitian about what dietary choices are best for you. Grains  Baked goods made with fat, such as croissants, muffins, or some breads. Dry pasta or rice meal packs. Vegetables  Creamed or fried vegetables. Vegetables in a cheese sauce. Regular canned vegetables (not low-sodium or reduced-sodium).  Regular canned tomato sauce and paste (not low-sodium or reduced-sodium). Regular tomato and vegetable juice (not low-sodium or reduced-sodium). Tasha Ortiz. Olives. Fruits  Canned fruit in a light or heavy syrup. Fried fruit. Fruit in cream or butter sauce. Meat and other protein foods  Fatty cuts of meat. Ribs. Fried meat. Tasha Ortiz. Sausage. Bologna and other processed lunch meats. Salami. Fatback. Hotdogs. Bratwurst. Salted nuts and seeds. Canned beans with added salt. Canned or smoked fish. Whole eggs or egg yolks. Chicken or [place] with skin. Dairy  Whole or 2% milk, cream, and half-and-half. Whole or full-fat cream cheese. Whole-fat or sweetened yogurt. Full-fat cheese. Nondairy creamers. Whipped toppings. Processed cheese and cheese spreads. Fats and oils  Butter. Stick margarine. Lard. Shortening. Ghee. Bacon fat. Tropical oils, such as coconut, palm kernel, or palm oil. Seasoning and other foods  Salted popcorn and pretzels. Onion salt, garlic salt, seasoned salt, table salt, and sea salt. Worcestershire sauce. Tartar sauce. Barbecue sauce. Teriyaki sauce. Soy sauce, including reduced-sodium. Steak sauce. Canned and packaged gravies. Fish sauce. Oyster sauce. Cocktail sauce. Horseradish that you find on the shelf. Ketchup. Mustard. Meat flavorings and tenderizers. Bouillon cubes. Hot sauce and Tabasco sauce. Premade or packaged marinades. Premade or packaged  taco seasonings. Relishes. Regular salad dressings. Where to find more information:  National Heart, Lung, and [place]: https://wilson-eaton.com/  American Heart Association: www.heart.org Summary  The DASH eating plan is a healthy eating plan that has been shown to reduce high blood pressure (hypertension). It may also reduce your risk for type 2 diabetes, heart disease, and stroke.  With the DASH eating plan, you should limit salt (sodium) intake to 2,300 mg a day. If you have hypertension, you may need to reduce your sodium intake to  1,500 mg a day.  When on the DASH eating plan, aim to eat more fresh fruits and vegetables, whole grains, lean proteins, low-fat dairy, and heart-healthy fats.  Work with your health care provider or diet and nutrition specialist (dietitian) to adjust your eating plan to your individual calorie needs. This information is not intended to replace advice given to you by your health care provider. Make sure you discuss any questions you have with your health care provider. Document Released: 03/08/2011 Document Revised: 03/12/2016 Document Reviewed: 03/12/2016 Elsevier Interactive Patient Education  2017 [employer].

## 2016-07-17 NOTE — Progress Notes (Signed)
Subjective:    Patient ID: Tasha Ortiz, female    DOB: Nov 20, 1955, 61 y.o.   MRN: [id number]  Chief Complaint  Patient presents with  . Hypertension    BP has been running high, does not know how long it has been reading high noticed it on sunday, was 180/103 and rechecked was 164/102    HPI:  Tasha Ortiz is a 61 y.o. female who  has a past medical history of Acid reflux; Allergy; Breast cancer ([place]); Heartburn; Hypercholesteremia; Hypertension; and Hypertension. and presents today for a follow up office visit.  1.) Hypertension - Currently maintained on hydrochlorothiazide. Reports taking the medication as prescribed and denies adverse side effects. Has noted that her blood pressures have been elevated for the past several days with readings as high as 180/103 and a recheck of 164/102. Denies worst headache of life with no symptoms of end organ damage. Notes that she had several high sodium meals recently.   BP Readings from Last 3 Encounters:  07/17/16 138/90  05/10/16 136/82  04/14/16 134/80     Allergies  Allergen Reactions  . Lisinopril Swelling      Outpatient Medications Prior to Visit  Medication Sig Dispense Refill  . famotidine (PEPCID) 20 MG tablet Take 1 tablet (20 mg total) by mouth 2 (two) times daily as needed for heartburn or indigestion. 180 tablet 3  . fluticasone (FLONASE) 50 MCG/ACT nasal spray Place 2 sprays into both nostrils daily. 16 g 0  . hydrochlorothiazide (HYDRODIURIL) 25 MG tablet TAKE 1 TABLET BY MOUTH EVERY DAY 90 tablet 3  . oxymetazoline (AFRIN NASAL SPRAY) 0.05 % nasal spray Place 1 spray into both nostrils 2 (two) times daily. Use only for 3days, then stop 30 mL 0  . pravastatin (PRAVACHOL) 40 MG tablet Take 1 tablet (40 mg total) by mouth daily. 90 tablet 1  . tamoxifen (NOLVADEX) 20 MG tablet Take 1 tablet (20 mg total) by mouth daily. 90 tablet 3  . azithromycin (ZITHROMAX Z-PAK) 250 MG tablet Take 1 tablet (250 mg total) by  mouth daily. Take 2tabs on first day, then 1tab once a day till complete 6 tablet 0  . dextromethorphan-guaiFENesin (MUCINEX DM) 30-600 MG 12hr tablet Take 1 tablet by mouth 2 (two) times daily as needed for cough. 14 tablet 0   No facility-administered medications prior to visit.      Review of Systems  Constitutional: Negative for chills and fever.  Eyes:       Negative for changes in vision  Respiratory: Negative for cough, chest tightness and wheezing.   Cardiovascular: Negative for chest pain, palpitations and leg swelling.  Neurological: Negative for dizziness, weakness and light-headedness.      Objective:    BP 138/90 (BP Location: Left Arm, Patient Position: Sitting, Cuff Size: Large)   Pulse 65   Temp 98.4 F (36.9 C) (Oral)   Resp 16   Ht 5\' 4"  (1.626 m)   Wt 164 lb 12.8 oz (74.8 kg)   SpO2 98%   BMI 28.29 kg/m  Nursing note and vital signs reviewed.  Physical Exam  Constitutional: She is oriented to person, place, and time. She appears well-developed and well-nourished. No distress.  Cardiovascular: Normal rate, regular rhythm, normal heart sounds and intact distal pulses.   Pulmonary/Chest: Effort normal and breath sounds normal.  Neurological: She is alert and oriented to person, place, and time.  Skin: Skin is warm and dry.  Psychiatric: She has a normal mood and  affect. Her behavior is normal. Judgment and thought content normal.       Assessment & Plan:   Problem List Items Addressed This Visit      Cardiovascular and Mediastinum   Essential hypertension, benign - Primary    Blood pressure appears with improved control since previous readings. Elevation in blood pressure most likely associated with increased sodium intake. Advised to watch sodium. Continue current dosage of hydrochlorothiazide. Start amlodipine. Denies worse headache of life with no symptoms of end organ damage noted on physical exam. Continue to monitor blood pressure at home. Follow-up  in 2 weeks with readings.      Relevant Medications   amLODipine (NORVASC) 5 MG tablet       I have discontinued Ms. Verne's azithromycin and dextromethorphan-guaiFENesin. I am also having her start on amLODipine. Additionally, I am having her maintain her famotidine, hydrochlorothiazide, tamoxifen, pravastatin, oxymetazoline, and fluticasone.   Meds ordered this encounter  Medications  . amLODipine (NORVASC) 5 MG tablet    Sig: Take 1 tablet (5 mg total) by mouth daily.    Dispense:  90 tablet    Refill:  0    Order Specific Question:   Supervising Provider    Answer:   Pricilla Holm A [[id number]]     Follow-up: Return if symptoms worsen or fail to improve.  Mauricio Po, FNP

## 2016-07-17 NOTE — Assessment & Plan Note (Signed)
Blood pressure appears with improved control since previous readings. Elevation in blood pressure most likely associated with increased sodium intake. Advised to watch sodium. Continue current dosage of hydrochlorothiazide. Start amlodipine. Denies worse headache of life with no symptoms of end organ damage noted on physical exam. Continue to monitor blood pressure at home. Follow-up in 2 weeks with readings.

## 2016-07-31 ENCOUNTER — Other Ambulatory Visit: Payer: Self-pay | Admitting: Family

## 2016-07-31 DIAGNOSIS — J0140 Acute pansinusitis, unspecified: Secondary | ICD-10-CM

## 2016-07-31 DIAGNOSIS — J309 Allergic rhinitis, unspecified: Secondary | ICD-10-CM

## 2016-08-06 ENCOUNTER — Ambulatory Visit
Admission: RE | Admit: 2016-08-06 | Discharge: 2016-08-06 | Disposition: A | Discharge status: Home / Self Care | Payer: BLUE CROSS/BLUE SHIELD | Source: Ambulatory Visit | Attending: Hematology and Oncology | Admitting: Hematology and Oncology

## 2016-08-06 DIAGNOSIS — Z1231 Encounter for screening mammogram for malignant neoplasm of breast: Secondary | ICD-10-CM

## 2016-08-06 DIAGNOSIS — Z9011 Acquired absence of right breast and nipple: Secondary | ICD-10-CM

## 2016-08-06 HISTORY — DX: Personal history of irradiation: Z92.3

## 2016-08-06 HISTORY — DX: Personal history of antineoplastic chemotherapy: Z92.21

## 2016-09-21 ENCOUNTER — Encounter: Payer: Self-pay | Admitting: Family Medicine

## 2016-09-21 ENCOUNTER — Ambulatory Visit (INDEPENDENT_AMBULATORY_CARE_PROVIDER_SITE_OTHER): Payer: BLUE CROSS/BLUE SHIELD | Admitting: Family Medicine

## 2016-09-21 VITALS — BP 110/80 | HR 79 | Temp 98.8°F | Wt 165.5 lb

## 2016-09-21 DIAGNOSIS — R51 Headache: Secondary | ICD-10-CM | POA: Diagnosis not present

## 2016-09-21 DIAGNOSIS — J0140 Acute pansinusitis, unspecified: Secondary | ICD-10-CM

## 2016-09-21 DIAGNOSIS — R519 Headache, unspecified: Secondary | ICD-10-CM

## 2016-09-21 MED ORDER — FLUTICASONE PROPIONATE 50 MCG/ACT NA SUSP: 2.0000 | Freq: Every day | NASAL | 3 refills | Status: DC

## 2016-09-21 MED ORDER — CEFUROXIME AXETIL 500 MG PO TABS: 500.0000 mg | ORAL_TABLET | Freq: Two times a day (BID) | ORAL | 0 refills | Status: DC

## 2016-09-21 NOTE — Patient Instructions (Signed)
Try some OTC plain Mucinex Stay well hydrated May continue with Flonase nasal spray.

## 2016-09-21 NOTE — Progress Notes (Signed)
Subjective:     Patient ID: Tasha Ortiz, female   DOB: Oct 29, 1955, 61 y.o.   MRN: [id number]  HPI Patient seen with about a 3 day history of what she describes as a "sinus headache". She has some frontal and maxillary pressure and occipital headache which has been typical of previous sinus infections. No cough. Mild sore throat. No fevers. Denies any active allergy symptoms. Increased malaise. Has used Flonase sporadically in the past. No sick contacts.  Past Medical History:  Diagnosis Date  . Acid reflux   . Allergy    seasonal  . Breast cancer ([place])    right,no iv or blood on right sid, chemo and radiation 2014  . Heartburn   . Hypercholesteremia    taken off chol meds Dec 2012  . Hypertension   . Hypertension   . Personal history of chemotherapy 05/2012   rt breast  . Personal history of radiation therapy 10/2012   rt breast   Past Surgical History:  Procedure Laterality Date  . BREAST BIOPSY Right 04/16/2012  . BREAST SURGERY Right 05/20/12   Rt br mastectomy  . CESAREAN SECTION  25 years ago  . colonoscopy    . COLONOSCOPY WITH PROPOFOL N/A 01/18/2015   Procedure: COLONOSCOPY WITH PROPOFOL;  Surgeon: Mauri Pole, MD;  Location: [facility];  Service: Endoscopy;  Laterality: N/A;  . MASTECTOMY Right 05/2012  . MODIFIED MASTECTOMY Right 05/20/2012   Procedure: MODIFIED MASTECTOMY;  Surgeon: Edward Jolly, MD;  Location: [place];  Service: General;  Laterality: Right;  . PORTACATH PLACEMENT Left 05/20/2012   Procedure: INSERTION PORT-A-CATH;  Surgeon: Edward Jolly, MD;  Location: [place];  Service: General;  Laterality: Left;  . TUBAL LIGATION      reports that she has never smoked. She quit smokeless tobacco use about 19 years ago. Her smokeless tobacco use included Chew. She reports that she does not drink alcohol or use drugs. family history includes Cancer in her cousin and sister; Diabetes in her daughter; Healthy in her father, maternal grandfather,  maternal grandmother, paternal grandfather, and paternal grandmother; Hypertension in her mother. Allergies  Allergen Reactions  . Lisinopril Swelling     Review of Systems  Constitutional: Positive for fatigue. Negative for chills and fever.  HENT: Positive for congestion, sinus pain and sinus pressure. Negative for facial swelling.   Respiratory: Negative for cough.   Neurological: Positive for headaches.       Objective:   Physical Exam  Constitutional: She appears well-developed and well-nourished.  HENT:  Right Ear: External ear normal.  Left Ear: External ear normal.  Nose: Nose normal.  Mouth/Throat: Oropharynx is clear and moist.  Neck: Neck supple.  Cardiovascular: Normal rate and regular rhythm.   Pulmonary/Chest: Effort normal and breath sounds normal. No respiratory distress. She has no wheezes. She has no rales.  Lymphadenopathy:    She has no cervical adenopathy.       Assessment:     Sinus headache. Nonfocal exam.    Plan:     -Stay well-hydrated -Consider over-the-counter plain Mucinex -Consider nasal irrigation with sterile saline -Continue over-the-counter Flonase -Prescription for Ceftin 500 mg twice a day for 10 days if she has any persistent or worsening symptoms. We have not recommended she start antibiotic at this time. We reviewed parameters for starting that.  Eulas Post MD [facility]

## 2016-10-11 ENCOUNTER — Other Ambulatory Visit: Payer: Self-pay | Admitting: Family

## 2016-10-18 ENCOUNTER — Encounter: Payer: Self-pay | Admitting: Internal Medicine

## 2016-10-18 ENCOUNTER — Ambulatory Visit (INDEPENDENT_AMBULATORY_CARE_PROVIDER_SITE_OTHER): Payer: BLUE CROSS/BLUE SHIELD | Admitting: Internal Medicine

## 2016-10-18 VITALS — BP 112/84 | HR 75 | Ht 64.0 in | Wt 165.0 lb

## 2016-10-18 DIAGNOSIS — I10 Essential (primary) hypertension: Secondary | ICD-10-CM

## 2016-10-18 DIAGNOSIS — J019 Acute sinusitis, unspecified: Secondary | ICD-10-CM

## 2016-10-18 MED ORDER — CEPHALEXIN 500 MG PO CAPS: 500.0000 mg | ORAL_CAPSULE | Freq: Three times a day (TID) | ORAL | 0 refills | Status: AC

## 2016-10-18 NOTE — Patient Instructions (Signed)
Please take all new medication as prescribed - the antibiotic  Please continue all other medications as before, and refills have been done if requested.  Please have the pharmacy call with any other refills you may need.  Please keep your appointments with your specialists as you may have planned  You are given the work note

## 2016-10-18 NOTE — Progress Notes (Signed)
Subjective:    Patient ID: Tasha Ortiz, female    DOB: 1955/06/27, 61 y.o.   MRN: [id number]  HPI   Here with 2-3 days acute onset fever, facial pain, pressure, headache, general weakness and malaise, and greenish d/c, with mild ST and cough, but pt denies chest pain, wheezing, increased sob or doe, orthopnea, PND, increased LE swelling, palpitations, dizziness or syncope. Had to leave work early, needs note for work Past Medical History:  Diagnosis Date  . Acid reflux   . Allergy    seasonal  . Breast cancer ([place])    right,no iv or blood on right sid, chemo and radiation 2014  . Heartburn   . Hypercholesteremia    taken off chol meds Dec 2012  . Hypertension   . Hypertension   . Personal history of chemotherapy 05/2012   rt breast  . Personal history of radiation therapy 10/2012   rt breast   Past Surgical History:  Procedure Laterality Date  . BREAST BIOPSY Right 04/16/2012  . BREAST SURGERY Right 05/20/12   Rt br mastectomy  . CESAREAN SECTION  25 years ago  . colonoscopy    . COLONOSCOPY WITH PROPOFOL N/A 01/18/2015   Procedure: COLONOSCOPY WITH PROPOFOL;  Surgeon: Mauri Pole, MD;  [facility]: [facility];  Service: Endoscopy;  Laterality: N/A;  . MASTECTOMY Right 05/2012  . MODIFIED MASTECTOMY Right 05/20/2012   Procedure: MODIFIED MASTECTOMY;  Surgeon: Edward Jolly, MD;  Location: [place];  Service: General;  Laterality: Right;  . PORTACATH PLACEMENT Left 05/20/2012   Procedure: INSERTION PORT-A-CATH;  Surgeon: Edward Jolly, MD;  Location: [place];  Service: General;  Laterality: Left;  . TUBAL LIGATION      reports that she has never smoked. She quit smokeless tobacco use about 19 years ago. Her smokeless tobacco use included Chew. She reports that she does not drink alcohol or use drugs. family history includes Cancer in her cousin and sister; Diabetes in her daughter; Healthy in her father, maternal grandfather, maternal grandmother, paternal  grandfather, and paternal grandmother; Hypertension in her mother. Allergies  Allergen Reactions  . Lisinopril Swelling   Current Outpatient Prescriptions on File Prior to Visit  Medication Sig Dispense Refill  . amLODipine (NORVASC) 5 MG tablet TAKE 1 TABLET BY MOUTH EVERY DAY 90 tablet 0  . famotidine (PEPCID) 20 MG tablet Take 1 tablet (20 mg total) by mouth 2 (two) times daily as needed for heartburn or indigestion. 180 tablet 3  . fluticasone (FLONASE) 50 MCG/ACT nasal spray Place 2 sprays into both nostrils daily. 16 g 3  . hydrochlorothiazide (HYDRODIURIL) 25 MG tablet TAKE 1 TABLET BY MOUTH EVERY DAY 90 tablet 3  . oxymetazoline (AFRIN NASAL SPRAY) 0.05 % nasal spray Place 1 spray into both nostrils 2 (two) times daily. Use only for 3days, then stop 30 mL 0  . pravastatin (PRAVACHOL) 40 MG tablet Take 1 tablet (40 mg total) by mouth daily. 90 tablet 1  . tamoxifen (NOLVADEX) 20 MG tablet Take 1 tablet (20 mg total) by mouth daily. 90 tablet 3  . [DISCONTINUED] Prochlorperazine Maleate (COMPAZINE PO) Take by mouth.     No current facility-administered medications on file prior to visit.    Review of Systems All otherwise neg per pt    Objective:   Physical Exam BP 112/84   Pulse 75   Ht 5\' 4"  (1.626 m)   Wt 165 lb (74.8 kg)   SpO2 98%   BMI 28.32  kg/m  VS noted, mild ill Constitutional: Pt appears in NAD HENT: Head: NCAT.  Right Ear: External ear normal.  Left Ear: External ear normal.  Eyes: . Pupils are equal, round, and reactive to light. Conjunctivae and EOM are normal Nose: without d/c or deformity \\Bilat  tm's with mild erythema.  Max sinus areas mild tender.  Pharynx with mild erythema, no exudate Neck: Neck supple. Gross normal ROM Cardiovascular: Normal rate and regular rhythm.   Pulmonary/Chest: Effort normal and breath sounds without rales or wheezing.  Neurological: Pt is alert. At baseline orientation, motor grossly intact Skin: Skin is warm. No rashes,  other new lesions, no LE edema Psychiatric: Pt behavior is normal without agitation  No other exam findings    Assessment & Plan:

## 2016-10-20 NOTE — Assessment & Plan Note (Signed)
Mild to mod, for antibx course,  to f/u any worsening symptoms or concerns 

## 2016-10-20 NOTE — Assessment & Plan Note (Signed)
stable overall by history and exam, recent data reviewed with pt, and pt to continue medical treatment as before,  to f/u any worsening symptoms or concerns BP Readings from Last 3 Encounters:  10/18/16 112/84  09/21/16 110/80  07/17/16 138/90

## 2016-10-29 ENCOUNTER — Other Ambulatory Visit: Payer: Self-pay | Admitting: Internal Medicine

## 2016-11-07 ENCOUNTER — Other Ambulatory Visit: Payer: Self-pay | Admitting: Family

## 2016-11-13 ENCOUNTER — Ambulatory Visit (INDEPENDENT_AMBULATORY_CARE_PROVIDER_SITE_OTHER): Payer: BLUE CROSS/BLUE SHIELD | Admitting: Internal Medicine

## 2016-11-13 ENCOUNTER — Encounter: Payer: Self-pay | Admitting: Internal Medicine

## 2016-11-13 VITALS — BP 124/86 | HR 77 | Ht 64.0 in | Wt 172.0 lb

## 2016-11-13 DIAGNOSIS — M542 Cervicalgia: Secondary | ICD-10-CM

## 2016-11-13 DIAGNOSIS — R42 Dizziness and giddiness: Secondary | ICD-10-CM | POA: Insufficient documentation

## 2016-11-13 DIAGNOSIS — I10 Essential (primary) hypertension: Secondary | ICD-10-CM

## 2016-11-13 MED ORDER — AMLODIPINE BESYLATE 10 MG PO TABS: 10.0000 mg | ORAL_TABLET | Freq: Every day | ORAL | 3 refills | Status: DC

## 2016-11-13 MED ORDER — CYCLOBENZAPRINE HCL 5 MG PO TABS: 5.0000 mg | ORAL_TABLET | Freq: Three times a day (TID) | ORAL | 1 refills | Status: DC | PRN

## 2016-11-13 MED ORDER — CETIRIZINE HCL 10 MG PO TABS: 10.0000 mg | ORAL_TABLET | Freq: Every day | ORAL | 11 refills | Status: DC

## 2016-11-13 MED ORDER — MECLIZINE HCL 12.5 MG PO TABS: 12.5000 mg | ORAL_TABLET | Freq: Three times a day (TID) | ORAL | 1 refills | Status: DC | PRN

## 2016-11-13 NOTE — Assessment & Plan Note (Signed)
Exam c/w peripheral vertigok for trial meclizine prn, and when better controll, nausea should improve

## 2016-11-13 NOTE — Progress Notes (Signed)
Subjective:    Patient ID: Tasha Ortiz, female    DOB: 02-25-1956, 61 y.o.   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  Increased oral fluids in the last 2 days has helped.  Also described Does have several wks ongoing nasal allergy symptoms with clearish congestion, itch and sneezing, without fever, pain, ST, cough, swelling or wheezing, but with ear fullness, popping noises, and vertigo like dizziness as well.  Is on chronic HCT 25 mg for BP. Pt denies chest pain, increased sob or doe, wheezing, orthopnea, PND, increased LE swelling, palpitations, dizziness or syncope.  Pt denies new neurological symptoms such as new headache, or facial or extremity weakness or numbness  Pt denies polydipsia, polyuria. Also c/o incidentally of post bilat lower cervical tenderness and pain as she works somewhat bent forward every day.  No radicular symptoms Past Medical History:  Diagnosis Date  . Acid reflux   . Allergy    seasonal  . Breast cancer ([place])    right,no iv or blood on right sid, chemo and radiation 2014  . Heartburn   . Hypercholesteremia    taken off chol meds Dec 2012  . Hypertension   . Hypertension   . Personal history of chemotherapy 05/2012   rt breast  . Personal history of radiation therapy 10/2012   rt breast   Past Surgical History:  Procedure Laterality Date  . BREAST BIOPSY Right 04/16/2012  . BREAST SURGERY Right 05/20/12   Rt br mastectomy  . CESAREAN SECTION  25 years ago  . colonoscopy    . COLONOSCOPY WITH PROPOFOL N/A 01/18/2015   Procedure: COLONOSCOPY WITH PROPOFOL;  Surgeon: Mauri Pole, MD;  [facility];  Service: Endoscopy;  Laterality: N/A;  . MASTECTOMY Right 05/2012  . MODIFIED MASTECTOMY Right 05/20/2012   Procedure: MODIFIED MASTECTOMY;  Surgeon: Edward Jolly, MD;  Location: [place];  Service: General;   Laterality: Right;  . PORTACATH PLACEMENT Left 05/20/2012   Procedure: INSERTION PORT-A-CATH;  Surgeon: Edward Jolly, MD;  Location: [place];  Service: General;  Laterality: Left;  . TUBAL LIGATION      reports that she has never smoked. She quit smokeless tobacco use about 20 years ago. Her smokeless tobacco use included Chew. She reports that she does not drink alcohol or use drugs. family history includes Cancer in her cousin and sister; Diabetes in her daughter; Healthy in her father, maternal grandfather, maternal grandmother, paternal grandfather, and paternal grandmother; Hypertension in her mother. Allergies  Allergen Reactions  . Lisinopril Swelling   Current Outpatient Prescriptions on File Prior to Visit  Medication Sig Dispense Refill  . famotidine (PEPCID) 20 MG tablet Take 1 tablet (20 mg total) by mouth 2 (two) times daily as needed for heartburn or indigestion. 180 tablet 3  . fluticasone (FLONASE) 50 MCG/ACT nasal spray Place 2 sprays into both nostrils daily. 16 g 3  . oxymetazoline (AFRIN NASAL SPRAY) 0.05 % nasal spray Place 1 spray into both nostrils 2 (two) times daily. Use only for 3days, then stop 30 mL 0  . pravastatin (PRAVACHOL) 40 MG tablet Take 1 tablet (40 mg total) by mouth daily. 90 tablet 1  . tamoxifen (NOLVADEX) 20 MG tablet Take 1 tablet (20 mg total) by mouth daily. 90 tablet 3  . [DISCONTINUED] Prochlorperazine Maleate (COMPAZINE PO) Take  by mouth.     No current facility-administered medications on file prior to visit.    Review of Systems  Constitutional: Negative for other unusual diaphoresis or sweats HENT: Negative for ear discharge or swelling Eyes: Negative for other worsening visual disturbances Respiratory: Negative for stridor or other swelling  Gastrointestinal: Negative for worsening distension or other blood Genitourinary: Negative for retention or other urinary change Musculoskeletal: Negative for other MSK pain or swelling Skin:  Negative for color change or other new lesions Neurological: Negative for worsening tremors and other numbness  Psychiatric/Behavioral: Negative for worsening agitation or other fatigue All other system neg per pt    Objective:   Physical Exam BP 124/86   Pulse 77   Ht 5\' 4"  (1.626 m)   Wt 172 lb (78 kg)   SpO2 98%   BMI 29.52 kg/m  VS noted, not ill appearing Constitutional: Pt appears in NAD HENT: Head: NCAT.  Right Ear: External ear normal.  Left Ear: External ear normal.  Eyes: . Pupils are equal, round, and reactive to light. Conjunctivae and EOM are normal Nose: without d/c or deformity Bilat tm's with mild erythema.  Max sinus areas none tender.  Pharynx with mild erythema, no exudate Neck: Neck supple. Gross normal ROM but has mild bilat paracervical tenderness without swelling, rash or midline tenderness  Cardiovascular: Normal rate and regular rhythm.   Pulmonary/Chest: Effort normal and breath sounds without rales or wheezing.  Abd:  Soft, NT, ND, + BS, no organomely Neurological: Pt is alert. At baseline orientation, motor grossly intact Skin: Skin is warm. No rashes, other new lesions, no LE edema Psychiatric: Pt behavior is normal without agitation  No other exam findings  Lab Results  Component Value Date   WBC 4.2 07/28/2015   HGB 14.6 07/28/2015   HCT 43.2 07/28/2015   PLT 195.0 07/28/2015   GLUCOSE 101 (H) 07/28/2015   CHOL 174 11/04/2014   TRIG 118.0 11/04/2014   HDL 45.30 11/04/2014   LDLDIRECT 111 (H) 03/10/2012   LDLCALC 105 (H) 11/04/2014   ALT 15 07/28/2015   AST 17 07/28/2015   NA 143 07/28/2015   K 3.6 07/28/2015   CL 105 07/28/2015   CREATININE 0.77 07/28/2015   BUN 13 07/28/2015   CO2 29 07/28/2015   TSH 5.11 (H) 07/28/2015   HGBA1C 6.2 (H) 04/27/2013       Assessment & Plan:

## 2016-11-13 NOTE — Assessment & Plan Note (Signed)
C/w msk strain, for flexeril prn,  to f/u any worsening symptoms or concerns

## 2016-11-13 NOTE — Patient Instructions (Addendum)
OK to stop the hydrochlorothiazide fluid pill  OK to increase the amlodipine to 10 mg (and you can take 2 of the 5 mg pills to use them up)  Please take all new medication as prescribed - the meclizine for pain, and the muscle relaxer for neck pain, as well as the zyrtec for allergies  Please continue all other medications as before, and refills have been done if requested.  Please have the pharmacy call with any other refills you may need.  Please keep your appointments with your specialists as you may have planned

## 2016-11-13 NOTE — Assessment & Plan Note (Signed)
Also to d/c the HCT as may be an element in her likely mild low volume symptoms during her work conditions; ok to increase the amlodipine to 10 qd, f/u 1 mo with PCP

## 2016-11-15 ENCOUNTER — Ambulatory Visit: Payer: BLUE CROSS/BLUE SHIELD | Admitting: Family

## 2016-11-19 ENCOUNTER — Emergency Department (HOSPITAL_COMMUNITY): Payer: BLUE CROSS/BLUE SHIELD

## 2016-11-19 ENCOUNTER — Inpatient Hospital Stay (HOSPITAL_COMMUNITY)
Admission: EM | Admit: 2016-11-19 | Discharge: 2016-11-24 | DRG: 025 | Disposition: A | Discharge status: Rehabilitation Facility | Payer: BLUE CROSS/BLUE SHIELD | Attending: Neurosurgery | Admitting: Neurosurgery

## 2016-11-19 ENCOUNTER — Encounter (HOSPITAL_COMMUNITY): Payer: Self-pay

## 2016-11-19 ENCOUNTER — Other Ambulatory Visit: Payer: Self-pay | Admitting: Family

## 2016-11-19 ENCOUNTER — Inpatient Hospital Stay (HOSPITAL_COMMUNITY): Payer: BLUE CROSS/BLUE SHIELD

## 2016-11-19 DIAGNOSIS — Z79810 Long term (current) use of selective estrogen receptor modulators (SERMs): Secondary | ICD-10-CM

## 2016-11-19 DIAGNOSIS — J309 Allergic rhinitis, unspecified: Secondary | ICD-10-CM | POA: Diagnosis present

## 2016-11-19 DIAGNOSIS — Z833 Family history of diabetes mellitus: Secondary | ICD-10-CM | POA: Diagnosis not present

## 2016-11-19 DIAGNOSIS — C7931 Secondary malignant neoplasm of brain: Principal | ICD-10-CM | POA: Diagnosis present

## 2016-11-19 DIAGNOSIS — G936 Cerebral edema: Secondary | ICD-10-CM | POA: Diagnosis present

## 2016-11-19 DIAGNOSIS — Z853 Personal history of malignant neoplasm of breast: Secondary | ICD-10-CM

## 2016-11-19 DIAGNOSIS — H8149 Vertigo of central origin, unspecified ear: Secondary | ICD-10-CM | POA: Diagnosis present

## 2016-11-19 DIAGNOSIS — H5500 Unspecified nystagmus: Secondary | ICD-10-CM | POA: Diagnosis not present

## 2016-11-19 DIAGNOSIS — Z9221 Personal history of antineoplastic chemotherapy: Secondary | ICD-10-CM

## 2016-11-19 DIAGNOSIS — Z452 Encounter for adjustment and management of vascular access device: Secondary | ICD-10-CM

## 2016-11-19 DIAGNOSIS — E7800 Pure hypercholesterolemia, unspecified: Secondary | ICD-10-CM

## 2016-11-19 DIAGNOSIS — D496 Neoplasm of unspecified behavior of brain: Secondary | ICD-10-CM | POA: Diagnosis not present

## 2016-11-19 DIAGNOSIS — R269 Unspecified abnormalities of gait and mobility: Secondary | ICD-10-CM | POA: Diagnosis not present

## 2016-11-19 DIAGNOSIS — J302 Other seasonal allergic rhinitis: Secondary | ICD-10-CM | POA: Diagnosis present

## 2016-11-19 DIAGNOSIS — C50411 Malignant neoplasm of upper-outer quadrant of right female breast: Secondary | ICD-10-CM | POA: Diagnosis not present

## 2016-11-19 DIAGNOSIS — Z87891 Personal history of nicotine dependence: Secondary | ICD-10-CM | POA: Diagnosis not present

## 2016-11-19 DIAGNOSIS — R7303 Prediabetes: Secondary | ICD-10-CM | POA: Diagnosis present

## 2016-11-19 DIAGNOSIS — Y92230 Patient room in hospital as the place of occurrence of the external cause: Secondary | ICD-10-CM | POA: Diagnosis not present

## 2016-11-19 DIAGNOSIS — Z923 Personal history of irradiation: Secondary | ICD-10-CM

## 2016-11-19 DIAGNOSIS — H81399 Other peripheral vertigo, unspecified ear: Secondary | ICD-10-CM | POA: Diagnosis present

## 2016-11-19 DIAGNOSIS — Z9011 Acquired absence of right breast and nipple: Secondary | ICD-10-CM | POA: Diagnosis not present

## 2016-11-19 DIAGNOSIS — R2689 Other abnormalities of gait and mobility: Secondary | ICD-10-CM | POA: Diagnosis not present

## 2016-11-19 DIAGNOSIS — Z808 Family history of malignant neoplasm of other organs or systems: Secondary | ICD-10-CM

## 2016-11-19 DIAGNOSIS — R42 Dizziness and giddiness: Secondary | ICD-10-CM | POA: Diagnosis not present

## 2016-11-19 DIAGNOSIS — K5900 Constipation, unspecified: Secondary | ICD-10-CM | POA: Diagnosis present

## 2016-11-19 DIAGNOSIS — Z888 Allergy status to other drugs, medicaments and biological substances status: Secondary | ICD-10-CM

## 2016-11-19 DIAGNOSIS — K760 Fatty (change of) liver, not elsewhere classified: Secondary | ICD-10-CM | POA: Diagnosis present

## 2016-11-19 DIAGNOSIS — T380X5A Adverse effect of glucocorticoids and synthetic analogues, initial encounter: Secondary | ICD-10-CM | POA: Diagnosis not present

## 2016-11-19 DIAGNOSIS — I10 Essential (primary) hypertension: Secondary | ICD-10-CM | POA: Diagnosis present

## 2016-11-19 DIAGNOSIS — D72829 Elevated white blood cell count, unspecified: Secondary | ICD-10-CM | POA: Diagnosis not present

## 2016-11-19 DIAGNOSIS — K219 Gastro-esophageal reflux disease without esophagitis: Secondary | ICD-10-CM | POA: Diagnosis present

## 2016-11-19 DIAGNOSIS — C801 Malignant (primary) neoplasm, unspecified: Secondary | ICD-10-CM | POA: Diagnosis not present

## 2016-11-19 DIAGNOSIS — E785 Hyperlipidemia, unspecified: Secondary | ICD-10-CM | POA: Diagnosis present

## 2016-11-19 DIAGNOSIS — Z9851 Tubal ligation status: Secondary | ICD-10-CM | POA: Diagnosis not present

## 2016-11-19 DIAGNOSIS — Z803 Family history of malignant neoplasm of breast: Secondary | ICD-10-CM | POA: Diagnosis not present

## 2016-11-19 DIAGNOSIS — R739 Hyperglycemia, unspecified: Secondary | ICD-10-CM | POA: Diagnosis not present

## 2016-11-19 DIAGNOSIS — G9389 Other specified disorders of brain: Secondary | ICD-10-CM | POA: Diagnosis present

## 2016-11-19 DIAGNOSIS — Z8249 Family history of ischemic heart disease and other diseases of the circulatory system: Secondary | ICD-10-CM

## 2016-11-19 DIAGNOSIS — K449 Diaphragmatic hernia without obstruction or gangrene: Secondary | ICD-10-CM | POA: Diagnosis present

## 2016-11-19 DIAGNOSIS — G939 Disorder of brain, unspecified: Secondary | ICD-10-CM | POA: Diagnosis not present

## 2016-11-19 DIAGNOSIS — I619 Nontraumatic intracerebral hemorrhage, unspecified: Secondary | ICD-10-CM | POA: Diagnosis not present

## 2016-11-19 DIAGNOSIS — C50919 Malignant neoplasm of unspecified site of unspecified female breast: Secondary | ICD-10-CM | POA: Diagnosis not present

## 2016-11-19 DIAGNOSIS — R270 Ataxia, unspecified: Secondary | ICD-10-CM | POA: Diagnosis not present

## 2016-11-19 DIAGNOSIS — R569 Unspecified convulsions: Secondary | ICD-10-CM | POA: Diagnosis not present

## 2016-11-19 LAB — COMPREHENSIVE METABOLIC PANEL
ALT: 20 U/L (ref 14–54)
AST: 25 U/L (ref 15–41)
Albumin: 3.9 g/dL (ref 3.5–5.0)
Alkaline Phosphatase: 43 U/L (ref 38–126)
Anion gap: 8 (ref 5–15)
BUN: 12 mg/dL (ref 6–20)
CO2: 26 mmol/L (ref 22–32)
Calcium: 9.2 mg/dL (ref 8.9–10.3)
Chloride: 108 mmol/L (ref 101–111)
Creatinine, Ser: 0.66 mg/dL (ref 0.44–1.00)
GFR calc Af Amer: >60 mL/min (ref >60)
GFR calc non Af Amer: >60 mL/min (ref >60)
Glucose, Bld: 111 mg/dL — ABNORMAL HIGH (ref 65–99)
Potassium: 3.9 mmol/L (ref 3.5–5.1)
Sodium: 142 mmol/L (ref 135–145)
Total Bilirubin: 0.4 mg/dL (ref 0.3–1.2)
Total Protein: 7.5 g/dL (ref 6.5–8.1)

## 2016-11-19 LAB — CBC WITH DIFFERENTIAL/PLATELET
Basophils Absolute: 0 10*3/uL (ref 0.0–0.1)
Basophils Relative: 0 %
Eosinophils Absolute: 0.1 10*3/uL (ref 0.0–0.7)
Eosinophils Relative: 2 %
HCT: 40.1 % (ref 36.0–46.0)
Hemoglobin: 13.8 g/dL (ref 12.0–15.0)
Lymphocytes Relative: 38 %
Lymphs Abs: 2.3 10*3/uL (ref 0.7–4.0)
MCH: 29.9 pg (ref 26.0–34.0)
MCHC: 34.4 g/dL (ref 30.0–36.0)
MCV: 86.8 fL (ref 78.0–100.0)
Monocytes Absolute: 0.4 10*3/uL (ref 0.1–1.0)
Monocytes Relative: 6 %
Neutro Abs: 3.2 10*3/uL (ref 1.7–7.7)
Neutrophils Relative %: 54 %
Platelets: 179 10*3/uL (ref 150–400)
RBC: 4.62 MIL/uL (ref 3.87–5.11)
RDW: 13.3 % (ref 11.5–15.5)
WBC: 6 10*3/uL (ref 4.0–10.5)

## 2016-11-19 MED ORDER — SODIUM CHLORIDE 0.9 % IV SOLN
INTRAVENOUS | Status: DC
  Administered 2016-11-19 – 2016-11-20 (×2): via INTRAVENOUS

## 2016-11-19 MED ORDER — ONDANSETRON HCL 4 MG/2ML IJ SOLN
4.0000 mg | Freq: Four times a day (QID) | INTRAMUSCULAR | Status: DC | PRN
  Administered 2016-11-21: 4 mg via INTRAVENOUS
  Filled 2016-11-19: qty 2

## 2016-11-19 MED ORDER — ACETAMINOPHEN 325 MG PO TABS: 650.0000 mg | ORAL_TABLET | Freq: Four times a day (QID) | ORAL | Status: DC | PRN

## 2016-11-19 MED ORDER — PRAVASTATIN SODIUM 40 MG PO TABS
40.0000 mg | ORAL_TABLET | Freq: Every day | ORAL | Status: DC
  Administered 2016-11-20 – 2016-11-24 (×4): 40 mg via ORAL
  Filled 2016-11-19 (×4): qty 1

## 2016-11-19 MED ORDER — DEXAMETHASONE SODIUM PHOSPHATE 4 MG/ML IJ SOLN
4.0000 mg | Freq: Three times a day (TID) | INTRAMUSCULAR | Status: DC
  Administered 2016-11-20 – 2016-11-22 (×6): 4 mg via INTRAVENOUS
  Filled 2016-11-19 (×6): qty 1

## 2016-11-19 MED ORDER — TAMOXIFEN CITRATE 10 MG PO TABS
20.0000 mg | ORAL_TABLET | Freq: Every day | ORAL | Status: DC
  Administered 2016-11-20 – 2016-11-24 (×4): 20 mg via ORAL
  Filled 2016-11-19 (×5): qty 2

## 2016-11-19 MED ORDER — ACETAMINOPHEN 650 MG RE SUPP: 650.0000 mg | Freq: Four times a day (QID) | RECTAL | Status: DC | PRN

## 2016-11-19 MED ORDER — LORATADINE 10 MG PO TABS
10.0000 mg | ORAL_TABLET | Freq: Every day | ORAL | Status: DC
  Administered 2016-11-20 – 2016-11-24 (×4): 10 mg via ORAL
  Filled 2016-11-19 (×4): qty 1

## 2016-11-19 MED ORDER — GADOBENATE DIMEGLUMINE 529 MG/ML IV SOLN
15.0000 mL | Freq: Once | INTRAVENOUS | Status: AC | PRN
  Administered 2016-11-19: 15 mL via INTRAVENOUS

## 2016-11-19 MED ORDER — DIAZEPAM 5 MG PO TABS
5.0000 mg | ORAL_TABLET | Freq: Once | ORAL | Status: AC
  Administered 2016-11-19: 5 mg via ORAL
  Filled 2016-11-19: qty 1

## 2016-11-19 MED ORDER — IOPAMIDOL (ISOVUE-300) INJECTION 61%
INTRAVENOUS | Status: AC
  Administered 2016-11-19: 100 mL
  Filled 2016-11-19: qty 100

## 2016-11-19 MED ORDER — AMLODIPINE BESYLATE 10 MG PO TABS
10.0000 mg | ORAL_TABLET | Freq: Every day | ORAL | Status: DC
  Administered 2016-11-20 – 2016-11-24 (×5): 10 mg via ORAL
  Filled 2016-11-19 (×2): qty 1
  Filled 2016-11-19: qty 2
  Filled 2016-11-19 (×2): qty 1

## 2016-11-19 MED ORDER — DEXAMETHASONE SODIUM PHOSPHATE 10 MG/ML IJ SOLN
10.0000 mg | Freq: Once | INTRAMUSCULAR | Status: AC
  Administered 2016-11-19: 10 mg via INTRAVENOUS
  Filled 2016-11-19: qty 1

## 2016-11-19 MED ORDER — ONDANSETRON HCL 4 MG PO TABS: 4.0000 mg | ORAL_TABLET | Freq: Four times a day (QID) | ORAL | Status: DC | PRN

## 2016-11-19 MED ORDER — MECLIZINE HCL 12.5 MG PO TABS
12.5000 mg | ORAL_TABLET | Freq: Three times a day (TID) | ORAL | Status: DC | PRN
  Filled 2016-11-19: qty 1

## 2016-11-19 MED ORDER — HYDROCODONE-ACETAMINOPHEN 5-325 MG PO TABS
1.0000 | ORAL_TABLET | ORAL | Status: DC | PRN
  Administered 2016-11-21: 1 via ORAL
  Filled 2016-11-19: qty 1

## 2016-11-19 NOTE — ED Notes (Addendum)
Pt states that she has been having sinus congestion with associated HA and dizziness x 2 weeks. Pt stated that she had seen her PCP who prescribed Meclazine, pt reports that it is effective for a short period of time. Pt does reports intermittent nausea but has not vomited. Pt is alert and oriented x 4 and is accompanied with daughter. Denies and blurred vision or visual changes.

## 2016-11-19 NOTE — ED Provider Notes (Signed)
Received care of patient from Dr. Zenia Resides at 5 PM. Briefly this is a 61 year old female who presents with vertigo. MRI is pending.  MRI done showing 29 mm mass within the inferior vermis with surrounding edema and local mass effect facing the fourth ventricle. Discussed with Dr. [name], who is seeing the patient. Patient transfer to [facility] for hospitalist admission, continued NSU evaluation, and malignancy workup. Suspect given her history of prior breast cancer this likely represents metastases. Discussed with patient. Transferred in stable condition.   Gareth Morgan, MD 11/22/16 [phone]

## 2016-11-19 NOTE — Consult Note (Signed)
Reason for Consult:cerebellar tumor Referring Physician: Dr. [name] is an 60 y.o. female.  HPI: the patient is a 61 year old black female who has a history of right breast cancer. She was treated with a mastectomy by Dr. Excell Seltzer. She had subsequent chemotherapy and region therapy. She has been on tamoxifen chronically. Her oncologist is Dr. Franklyn Lor. The patient has a history of chronic sinus headaches. She's had a worsening headache with some unsteady gait. She came to the ER this evening and was worked up with a brain MRI which demonstrated a cerebellar lesion. A neurosurgical consultation was requested.  Presently the patient is alert and pleasant. She complains of a mild headache. He has some nausea but no vomiting. She's had no seizures. She doesn't take any anticoagulants.  Past Medical History:  Diagnosis Date  . Acid reflux   . Allergy    seasonal  . Breast cancer ([place])    right,no iv or blood on right sid, chemo and radiation 2014  . Heartburn   . Hypercholesteremia    taken off chol meds Dec 2012  . Hypertension   . Hypertension   . Personal history of chemotherapy 05/2012   rt breast  . Personal history of radiation therapy 10/2012   rt breast    Past Surgical History:  Procedure Laterality Date  . BREAST BIOPSY Right 04/16/2012  . BREAST SURGERY Right 05/20/12   Rt br mastectomy  . CESAREAN SECTION  25 years ago  . colonoscopy    . COLONOSCOPY WITH PROPOFOL N/A 01/18/2015   Procedure: COLONOSCOPY WITH PROPOFOL;  Surgeon: Mauri Pole, MD;  Location: [facility];  Service: Endoscopy;  Laterality: N/A;  . MASTECTOMY Right 05/2012  . MODIFIED MASTECTOMY Right 05/20/2012   Procedure: MODIFIED MASTECTOMY;  Surgeon: Edward Jolly, MD;  Location: [facility];  Service: General;  Laterality: Right;  . PORTACATH PLACEMENT Left 05/20/2012   Procedure: INSERTION PORT-A-CATH;  Surgeon: Edward Jolly, MD;  Location: [facility];  Service: General;   Laterality: Left;  . TUBAL LIGATION      Family History  Problem Relation Age of Onset  . Hypertension Mother   . Cancer Sister        brain  . Cancer Cousin        breast  . Healthy Father   . Healthy Maternal Grandmother   . Healthy Maternal Grandfather   . Healthy Paternal Grandmother   . Healthy Paternal Grandfather   . Diabetes Daughter   . Colon cancer Neg Hx     Social History:  reports that she has never smoked. She quit smokeless tobacco use about 20 years ago. Her smokeless tobacco use included Chew. She reports that she does not drink alcohol or use drugs.  Allergies:  Allergies  Allergen Reactions  . Lisinopril Swelling    Medications:  I have reviewed the patient's current medications. Prior to Admission:  (Not in a hospital admission) Scheduled:  Continuous: . sodium chloride 20 mL/hr at 11/19/16 1232   PRN: Anti-infectives    None       Results for orders placed or performed during the hospital encounter of 11/19/16 (from the past 48 hour(s))  CBC with Differential/Platelet     Status: None   Collection Time: 11/19/16  1:08 PM  Result Value Ref Range   WBC 6.0 4.0 - 10.5 K/uL   RBC 4.62 3.87 - 5.11 MIL/uL   Hemoglobin 13.8 12.0 - 15.0 g/dL   HCT 40.1 36.0 -  46.0 %   MCV 86.8 78.0 - 100.0 fL   MCH 29.9 26.0 - 34.0 pg   MCHC 34.4 30.0 - 36.0 g/dL   RDW 13.3 11.5 - 15.5 %   Platelets 179 150 - 400 K/uL   Neutrophils Relative % 54 %   Neutro Abs 3.2 1.7 - 7.7 K/uL   Lymphocytes Relative 38 %   Lymphs Abs 2.3 0.7 - 4.0 K/uL   Monocytes Relative 6 %   Monocytes Absolute 0.4 0.1 - 1.0 K/uL   Eosinophils Relative 2 %   Eosinophils Absolute 0.1 0.0 - 0.7 K/uL   Basophils Relative 0 %   Basophils Absolute 0.0 0.0 - 0.1 K/uL  Comprehensive metabolic panel     Status: Abnormal   Collection Time: 11/19/16  1:08 PM  Result Value Ref Range   Sodium 142 135 - 145 mmol/L   Potassium 3.9 3.5 - 5.1 mmol/L   Chloride 108 101 - 111 mmol/L   CO2 26 22  - 32 mmol/L   Glucose, Bld 111 (H) 65 - 99 mg/dL   BUN 12 6 - 20 mg/dL   Creatinine, Ser 0.66 0.44 - 1.00 mg/dL   Calcium 9.2 8.9 - 10.3 mg/dL   Total Protein 7.5 6.5 - 8.1 g/dL   Albumin 3.9 3.5 - 5.0 g/dL   AST 25 15 - 41 U/L   ALT 20 14 - 54 U/L   Alkaline Phosphatase 43 38 - 126 U/L   Total Bilirubin 0.4 0.3 - 1.2 mg/dL   GFR calc non Af Amer >60 >60 mL/min   GFR calc Af Amer >60 >60 mL/min    Comment: (NOTE) The eGFR has been calculated using the CKD EPI equation. This calculation has not been validated in all clinical situations. eGFR's persistently <60 mL/min signify possible Chronic Kidney Disease.    Anion gap 8 5 - 15    Mr Jeri Cos Wo Contrast  Result Date: 11/19/2016 CLINICAL DATA:  61 y/o F; headache and dizziness for 2 weeks. Sinus congestion. History of right breast cancer post chemo and radiation. EXAM: MRI HEAD WITHOUT AND WITH CONTRAST TECHNIQUE: Multiplanar, multiecho pulse sequences of the brain and surrounding structures were obtained without and with intravenous contrast. CONTRAST:  15 cc MultiHance. COMPARISON:  03/27/2013 CT head FINDINGS: Brain: Enhancing mass centered within the inferior vermis with extension into right greater than left cerebellar hemispheres measuring 29 x 28 x 27 mm (AP x ML x CC series 14, image 14 and series 15, image 8). The there is surrounding T2 FLAIR hyperintense signal abnormality which extends throughout the vermis and right greater than left cerebellar hemispheres as well as into the right brachium pontis compatible with edema. Local mass effect partially effaces the fourth ventricle and right quadrigeminal plate cistern. No evidence for acute/early subacute infarct of the brain, acute intracranial hemorrhage, or additional enhancing lesion. No hydrocephalus at this time. Background of mild chronic microvascular ischemic changes and parenchymal volume loss of the brain. Vascular: Normal flow voids. Skull and upper cervical spine: Normal  marrow signal. Sinuses/Orbits: The mild ethmoid sinus mucosal thickening. No abnormal signal of mastoid air cells. Normal orbits. Other: None. IMPRESSION: 29 mm mass, likely metastasis, within the inferior vermis with surrounding edema and local mass effect partially effacing the fourth ventricle. No hydrocephalus at this time, but the patient is at high risk for developing obstructive hydrocephalus as mass effect progresses. These results were called by telephone at the time of interpretation on 11/19/2016 at 7:36 pm  to Dr. Billy Fischer, who verbally acknowledged these results. Electronically Signed   By: Kristine Garbe M.D.   On: 11/19/2016 19:43    ROS: As above Blood pressure 129/80, pulse 69, temperature 98.6 F (37 C), temperature source Oral, resp. rate 16, height 5' 4"  (0.165 m), weight 78 kg (172 lb), last menstrual period 08/16/2012, SpO2 100 %. Physical Exam   General: A alert and pleasant 61 year old black female in no apparent distress  HEENT: Normocephalic, atraumatic, pupils equal round reactive light, extraocular muscles are intact. Her pupils are equal.  Neck: Supple with a normal range of motion  Thorax: She's had a right mastectomy. She has a left Port-A-Cath in place  Abdomen soft and obese  Extremities: Unremarkable  Neurologic exam: The patient is alert and oriented 3. Glasgow Coma Scale 15. Cranial nerves II through XII were examined bilaterally and grossly normal. Agent and hearing are grossly normal bilaterally. Her motor strength is normal in about bicep, tricep, hand grip, quadriceps, gastrocnemius, and dorsiflexors. Cerebellar exam is intact to rapid alternate movements and finger-to-nose bilaterally. Sensory function is intact to light touch sensation in all tested dermatomes bilaterally.   I reviewed the patient's brain MRI performed was a long hospital today with without contrast. She has a cerebellar vermian lesion approximately 2.5 cm. Some mild  surrounding edema. There is no hydrocephalus. I don't seen any other lesions.  Assessment/Plan: Cerebellar tumor: I have discussed situation with the patient and her daughter. I recommend that she be admitted for static workup including CT of the chest abdomen and pelvis. We have discussed the various treatment options for this presumed breast cancer metastasis ncluding doing nothing, observation, biopsy,craniectomy for resection of the lesion. I recommended the latter.I have explained the surgery. We have discussed the risks of surgery including the risks of anesthesia, hemorrhage, infection, spinal fluid leak, incomplete tumor resection, recurrent tumor growth, medical risks such as pneumonia, stroke,death, etc. I have answered all their questions. I can tentatively do the surgery this Wednesday or Thursday depending on the metastatic workup. In the meantime we should put her on Decadron 4 mg every 8 hours I mentioned all their questions.  Tasha Ortiz D 11/19/2016, 9:01 PM
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
## 2016-11-19 NOTE — H&P (Signed)
Tasha Ortiz PRF:[id number] DOB: October 07, 1955 DOA: 11/19/2016     PCP: Golden Circle, FNP   Outpatient Specialists: Oncology Lindi Adie, GI at [facility] Patient coming from:    home Lives   With family    Chief Complaint: Vertigo ataxia headache  HPI: Tasha Ortiz is a 61 y.o. female with medical history significant of breast cancer, HTN, GERD, hypercholesteremia    Presented with 2 weeks history of progressively worsening vertigo ataxia occasional headaches at first it was thought to be secondary to sinusitis allergies she was evaluating her primary care provider was prescribed vertigo medications the setting improvement. She had consistent posterior nasal drainage and congestion meclizine does not seem to help. She did not have any diplopia or foot to 4 beat. No localized weakness in upper or lower extremity. Denies tinnitus no history of stroke. Reports good appetite. No weight loss. She reports last EGD/colonoscopy was in 2015 Due to recurrent abdominal pain. EGD showed small hiatal hernia and colonoscopy was normal.  She presented to emergency department today MRI scan of the brain found new brain mass possibly metastatic. She was admitted to [facility] for further evaluation with consult by neurosurgery   Regarding pertinent Chronic problems: History of breast cancer on the right status post mastectomy chemotherapy and radiation therapy in 2014 currently in remission. She is maintained on Tamoxifen with regular mammograms.    IN ER:  Temp (24hrs), Avg:98.4 F (36.9 C), Min:98.2 F (36.8 C), Max:98.6 F (37 C)      on arrival  ED Triage Vitals  Enc Vitals Group     BP 11/19/16 0847 (!) 151/89     Pulse Rate 11/19/16 0847 97     Resp 11/19/16 0847 16     Temp 11/19/16 0847 98.2 F (36.8 C)     Temp Source 11/19/16 0847 Oral     SpO2 11/19/16 0847 100 %     Weight 11/19/16 1205 172 lb (78 kg)     Height 11/19/16 1205 5\' 4"  (1.626 m)     Head Circumference --    Peak Flow --      Pain Score 11/19/16 0849 6     Pain Loc --      Pain Edu? --      Excl. in [place]? --   Latest RR 16 7000% HR 69 BP 129/80 WBC 6.0 hemoglobin 13.8 platelets 179 NA 142K3.9, CR 0.66 LFTs unremarkable  MRI of the brain showing 29 mm mass likely metastasis local mass effect partially effacing fourth ventricle no hydrocephalus  Following Medications were ordered in ER: Medications  0.9 %  sodium chloride infusion ( Intravenous New Bag/Given 11/19/16 1232)  diazepam (VALIUM) tablet 5 mg (5 mg Oral Given 11/19/16 1239)  gadobenate dimeglumine (MULTIHANCE) injection 15 mL (15 mLs Intravenous Contrast Given 11/19/16 1853)  dexamethasone (DECADRON) injection 10 mg (10 mg Intravenous Given 11/19/16 1955)     ER provider discussed case with:  Neurosurgery Dr. [name] recommend admission to [facility] patient may need operative intervention and this U date  Hospitalist was called for admission for new diagnosis of brain mass  Review of Systems:    Pertinent positives include: fatigue,nausea,  gait abnormality, vertigo sneezing, itching, nasal congestion, post nasal drip,   Constitutional:  No weight loss, night sweats, Fevers, chills,  weight loss  HEENT:  No headaches, Difficulty swallowing,Tooth/dental problems,Sore throat,  No ear ache,  Cardio-vascular:  No chest pain, Orthopnea, PND, anasarca, dizziness, palpitations.no Bilateral lower extremity  swelling  GI:  No heartburn, indigestion, abdominal pain, vomiting, diarrhea, change in bowel habits, loss of appetite, melena, blood in stool, hematemesis Resp:  no shortness of breath at rest. No dyspnea on exertion, No excess mucus, no productive cough, No non-productive cough, No coughing up of blood.No change in color of mucus.No wheezing. Skin:  no rash or lesions. No jaundice GU:  no dysuria, change in color of urine, no urgency or frequency. No straining to urinate.  No flank pain.  Musculoskeletal:  No joint pain or no  joint swelling. No decreased range of motion. No back pain.  Psych:  No change in mood or affect. No depression or anxiety. No memory loss.  Neuro: no localizing neurological complaints, no tingling, no weakness, no double vision, no, no slurred speech, no confusion  As per HPI otherwise 10 point review of systems negative.   Past Medical History: Past Medical History:  Diagnosis Date  . Acid reflux   . Allergy    seasonal  . Breast cancer ([place])    right,no iv or blood on right sid, chemo and radiation 2014  . Heartburn   . Hypercholesteremia    taken off chol meds Dec 2012  . Hypertension   . Hypertension   . Personal history of chemotherapy 05/2012   rt breast  . Personal history of radiation therapy 10/2012   rt breast   Past Surgical History:  Procedure Laterality Date  . BREAST BIOPSY Right 04/16/2012  . BREAST SURGERY Right 05/20/12   Rt br mastectomy  . CESAREAN SECTION  25 years ago  . colonoscopy    . COLONOSCOPY WITH PROPOFOL N/A 01/18/2015   Procedure: COLONOSCOPY WITH PROPOFOL;  Surgeon: Mauri Pole, MD;  Location: [facility];  Service: Endoscopy;  Laterality: N/A;  . MASTECTOMY Right 05/2012  . MODIFIED MASTECTOMY Right 05/20/2012   Procedure: MODIFIED MASTECTOMY;  Surgeon: Edward Jolly, MD;  Location: [place];  Service: General;  Laterality: Right;  . PORTACATH PLACEMENT Left 05/20/2012   Procedure: INSERTION PORT-A-CATH;  Surgeon: Edward Jolly, MD;  Location: [place];  Service: General;  Laterality: Left;  . TUBAL LIGATION       Social History:  Ambulatory independently     reports that she has never smoked. She quit smokeless tobacco use about 20 years ago. Her smokeless tobacco use included Chew. She reports that she does not drink alcohol or use drugs.  Allergies:   Allergies  Allergen Reactions  . Lisinopril Swelling    Family History:   Family History  Problem Relation Age of Onset  . Hypertension Mother   . Cancer Sister         brain  . Cancer Cousin        breast  . Healthy Father   . Healthy Maternal Grandmother   . Healthy Maternal Grandfather   . Healthy Paternal Grandmother   . Healthy Paternal Grandfather   . Diabetes Daughter   . Colon cancer Neg Hx     Medications: Prior to Admission medications   Medication Sig Start Date End Date Taking? Authorizing Provider  acetaminophen (TYLENOL) 325 MG tablet Take 650 mg by mouth every 6 (six) hours as needed for moderate pain.   Yes [provider]  amLODipine (NORVASC) 10 MG tablet Take 1 tablet (10 mg total) by mouth daily. 11/13/16  Yes Biagio Borg, MD  cetirizine (ZYRTEC) 10 MG tablet Take 1 tablet (10 mg total) by mouth daily. Patient taking differently: Take  10 mg by mouth daily as needed.  11/13/16 11/13/17 Yes Biagio Borg, MD  cyclobenzaprine (FLEXERIL) 5 MG tablet Take 1 tablet (5 mg total) by mouth 3 (three) times daily as needed for muscle spasms. 11/13/16  Yes Biagio Borg, MD  fluticasone [facility]) 50 MCG/ACT nasal spray Place 2 sprays into both nostrils daily. 09/21/16  Yes Burchette, Alinda Sierras, MD  meclizine (ANTIVERT) 12.5 MG tablet Take 1 tablet (12.5 mg total) by mouth 3 (three) times daily as needed for dizziness. 11/13/16 11/13/17 Yes Biagio Borg, MD  naproxen sodium (ANAPROX) 220 MG tablet Take 220 mg by mouth 2 (two) times daily as needed (pain).   Yes [provider]  tamoxifen (NOLVADEX) 20 MG tablet Take 1 tablet (20 mg total) by mouth daily. 01/31/16  Yes Golden Circle, FNP  famotidine (PEPCID) 20 MG tablet Take 1 tablet (20 mg total) by mouth 2 (two) times daily as needed for heartburn or indigestion. Patient not taking: Reported on 11/19/2016 02/23/15   Golden Circle, FNP  oxymetazoline (AFRIN NASAL SPRAY) 0.05 % nasal spray Place 1 spray into both nostrils 2 (two) times daily. Use only for 3days, then stop Patient taking differently: Place 1 spray into both nostrils 2 (two) times daily as needed for  congestion. Use only for 3days, then stop 05/10/16   Nche, Charlene Brooke, NP  pravastatin (PRAVACHOL) 40 MG tablet Take 1 tablet (40 mg total) by mouth daily. Annual appt is due w/labs must see provider for future refills 11/19/16   Golden Circle, FNP    Physical Exam: Patient Vitals for the past 24 hrs:  BP Temp Temp src Pulse Resp SpO2 Height Weight  11/19/16 1937 129/80 98.6 F (37 C) Oral 69 16 100 % - -  11/19/16 1711 125/88 98.5 F (36.9 C) Oral (!) 56 14 100 % - -  11/19/16 1405 (!) 143/92 98.3 F (36.8 C) Oral 91 16 93 % - -  11/19/16 1205 - - - - - - 5\' 4"  (1.626 m) 78 kg (172 lb)  11/19/16 1142 (!) 169/94 98.6 F (37 C) Oral 79 16 100 % - -  11/19/16 0847 (!) 151/89 98.2 F (36.8 C) Oral 97 16 100 % - -    1. General:  in No Acute distress 2. Psychological: Alert and  Oriented 3. Head/ENT:    Dry Mucous Membranes                          Head Non traumatic, neck supple                          Normal  Dentition 4. SKIN:  decreased Skin turgor,  Skin clean Dry and intact no rash 5. Heart: Regular rate and rhythm no  Murmur, Rub or gallop 6. Lungs:   no wheezes or crackles   7. Abdomen: Soft,  non-tender, Non distended 8. Lower extremities: no clubbing, cyanosis, or edema 9. Neurologically   strength 5 out of 5 in all 4 extremities cranial nerves II through XII intact 10. MSK: Normal range of motion   body mass index is 29.52 kg/m.  Labs on Admission:   Labs on Admission: I have personally reviewed following labs and imaging studies  CBC:  Recent Labs Lab 11/19/16 1308  WBC 6.0  NEUTROABS 3.2  HGB 13.8  HCT 40.1  MCV 86.8  PLT 956   Basic Metabolic  Panel:  Recent Labs Lab 11/19/16 1308  NA 142  K 3.9  CL 108  CO2 26  GLUCOSE 111*  BUN 12  CREATININE 0.66  CALCIUM 9.2   GFR: Estimated Creatinine Clearance: 76.5 mL/min (by C-G formula based on SCr of 0.66 mg/dL). Liver Function Tests:  Recent Labs Lab 11/19/16 1308  AST 25  ALT 20    ALKPHOS 43  BILITOT 0.4  PROT 7.5  ALBUMIN 3.9   No results for input(s): LIPASE, AMYLASE in the last 168 hours. No results for input(s): AMMONIA in the last 168 hours. Coagulation Profile: No results for input(s): INR, PROTIME in the last 168 hours. Cardiac Enzymes: No results for input(s): CKTOTAL, CKMB, CKMBINDEX, TROPONINI in the last 168 hours. BNP (last 3 results) No results for input(s): PROBNP in the last 8760 hours. HbA1C: No results for input(s): HGBA1C in the last 72 hours. CBG: No results for input(s): GLUCAP in the last 168 hours. Lipid Profile: No results for input(s): CHOL, HDL, LDLCALC, TRIG, CHOLHDL, LDLDIRECT in the last 72 hours. Thyroid Function Tests: No results for input(s): TSH, T4TOTAL, FREET4, T3FREE, THYROIDAB in the last 72 hours. Anemia Panel: No results for input(s): VITAMINB12, FOLATE, FERRITIN, TIBC, IRON, RETICCTPCT in the last 72 hours. Urine analysis:  Sepsis Labs: @LABRCNTIP (procalcitonin:4,lacticidven:4) )No results found for this or any previous visit (from the past 240 hour(s)).     UA  not ordered  Lab Results  Component Value Date   HGBA1C 6.2 (H) 04/27/2013    Estimated Creatinine Clearance: 76.5 mL/min (by C-G formula based on SCr of 0.66 mg/dL).  BNP (last 3 results) No results for input(s): PROBNP in the last 8760 hours.   ECG REPORT  Independently reviewed Rate:72  Rhythm: NSR ST&T Change: No acute ischemic changes   QTC 455  Filed Weights   11/19/16 1205  Weight: 78 kg (172 lb)     Cultures:    Component Value Date/Time   SDES URINE, CATHETERIZED 06/21/2013 2108   SPECREQUEST NONE 06/21/2013 2108   CULT YEAST Performed at [facility] 06/21/2013 2108   REPTSTATUS 06/23/2013 FINAL 06/21/2013 2108     Radiological Exams on Admission: Mr Jeri Cos YC Contrast  Result Date: 11/19/2016 CLINICAL DATA:  61 y/o F; headache and dizziness for 2 weeks. Sinus congestion. History of right breast cancer post  chemo and radiation. EXAM: MRI HEAD WITHOUT AND WITH CONTRAST TECHNIQUE: Multiplanar, multiecho pulse sequences of the brain and surrounding structures were obtained without and with intravenous contrast. CONTRAST:  15 cc MultiHance. COMPARISON:  03/27/2013 CT head FINDINGS: Brain: Enhancing mass centered within the inferior vermis with extension into right greater than left cerebellar hemispheres measuring 29 x 28 x 27 mm (AP x ML x CC series 14, image 14 and series 15, image 8). The there is surrounding T2 FLAIR hyperintense signal abnormality which extends throughout the vermis and right greater than left cerebellar hemispheres as well as into the right brachium pontis compatible with edema. Local mass effect partially effaces the fourth ventricle and right quadrigeminal plate cistern. No evidence for acute/early subacute infarct of the brain, acute intracranial hemorrhage, or additional enhancing lesion. No hydrocephalus at this time. Background of mild chronic microvascular ischemic changes and parenchymal volume loss of the brain. Vascular: Normal flow voids. Skull and upper cervical spine: Normal marrow signal. Sinuses/Orbits: The mild ethmoid sinus mucosal thickening. No abnormal signal of mastoid air cells. Normal orbits. Other: None. IMPRESSION: 29 mm mass, likely metastasis, within the inferior vermis with  surrounding edema and local mass effect partially effacing the fourth ventricle. No hydrocephalus at this time, but the patient is at high risk for developing obstructive hydrocephalus as mass effect progresses. These results were called by telephone at the time of interpretation on 11/19/2016 at 7:36 pm to Dr. Billy Fischer, who verbally acknowledged these results. Electronically Signed   By: Kristine Garbe M.D.   On: 11/19/2016 19:43    Chart has been reviewed    Assessment/Plan   61 y.o. female with medical history significant of breast cancer, HTN, GERD, hypercholesteremia Admitted  for  new diagnosis of brain mass  Present on Admission: . Brain mass admitted to [facility], dexamethasone 4 mg every 8 hours, appreciate neurosurgery consult, CT abdomen and pelvis for staging, if no evidence of diffuse disease plan to possibly operate Wednesday of Thursday as per neurosurgery . Essential hypertension, benign - stable continue home medications . Hyperlipidemia - stable continue home medications . Primary cancer of upper outer quadrant of right female breast ([place]) - we'll notify oncology patient has been admitted continue home medications   Other plan as per orders.  DVT prophylaxis:  SCD   Code Status:  FULL CODE   as per patient    Family Communication:   Family   at  Bedside  plan of care was discussed with   Daughters  Disposition Plan:    To home once workup is complete and patient is stable                     Consults called: Neurosurgery  Admission status:  inpatient    Level of care     medical floor           I have spent a total of 66 min on this admission   extra time was spent to discuss case with consultants  Rivaldo Hineman 11/19/2016, 9:33 PM    [facility]  Pager [phone]   after 2 AM please page floor coverage PA If 7AM-7PM, please contact the day team taking care of the patient  [email or web address]  Password [email or web address]

## 2016-11-19 NOTE — ED Notes (Signed)
Called report to Santiago Glad on [facility]

## 2016-11-19 NOTE — ED Triage Notes (Addendum)
Pt states that she has had a sinus infection but has continued to experience dizziness after taking meclizine prescribed by her PCP. Alert and oriented. Neuro intact.

## 2016-11-20 DIAGNOSIS — K5900 Constipation, unspecified: Secondary | ICD-10-CM

## 2016-11-20 DIAGNOSIS — J309 Allergic rhinitis, unspecified: Secondary | ICD-10-CM

## 2016-11-20 DIAGNOSIS — K760 Fatty (change of) liver, not elsewhere classified: Secondary | ICD-10-CM

## 2016-11-20 DIAGNOSIS — K219 Gastro-esophageal reflux disease without esophagitis: Secondary | ICD-10-CM

## 2016-11-20 MED ORDER — SODIUM CHLORIDE 0.9% FLUSH: 10.0000 mL | INTRAVENOUS | Status: DC | PRN

## 2016-11-20 MED ORDER — PANTOPRAZOLE SODIUM 40 MG PO TBEC
40.0000 mg | DELAYED_RELEASE_TABLET | Freq: Every day | ORAL | Status: DC
  Administered 2016-11-20 – 2016-11-24 (×5): 40 mg via ORAL
  Filled 2016-11-20 (×5): qty 1

## 2016-11-20 MED ORDER — SENNOSIDES-DOCUSATE SODIUM 8.6-50 MG PO TABS
1.0000 | ORAL_TABLET | Freq: Two times a day (BID) | ORAL | Status: DC
  Administered 2016-11-20 – 2016-11-24 (×8): 1 via ORAL
  Filled 2016-11-20 (×8): qty 1

## 2016-11-20 NOTE — Evaluation (Signed)
Occupational Therapy Evaluation Patient Details Name: Tasha Ortiz MRN: [id number] DOB: 1955/06/14 Today's Date: 11/20/2016    History of Present Illness Pt is a 61 y/o female who was admitted for worsening headache and decreased balance. MRI revealed cerebellar mass. Pt scheduled to have surgery either 8/22 or 8/23. PMH includes breast cancer s/p mastectomy, HTN, and L portacath placement.    Clinical Impression   PTA, pt was independent with ADL and functional mobility and working at [employer]. Pt currently requires overall supervision for safety with ADL and functional mobility. She does present with decreased B UE coordination with greatest deficits on the L impacting her safety with ADL and IADL tasks. Note plan for surgical intervention in the next few days and plan to assess functional skills post-surgically prior to proposing D/C recommendations. OT will continue to follow while admitted and update POC as appropriate.    Follow Up Recommendations  Other (comment) (TBD following surgical intervention)    Equipment Recommendations  Other (comment) (TBD following surgical intervention)    Recommendations for Other Services       Precautions / Restrictions Precautions Precautions: Fall Restrictions Weight Bearing Restrictions: No      Mobility Bed Mobility Overal bed mobility: Modified Independent             General bed mobility comments: No external assist required.   Transfers Overall transfer level: Needs assistance Equipment used: None Transfers: Sit to/from Stand Sit to Stand: Supervision         General transfer comment: Supervision for safety.     Balance Overall balance assessment: Needs assistance Sitting-balance support: No upper extremity supported;Feet supported Sitting balance-Leahy Scale: Good     Standing balance support: No upper extremity supported;During functional activity Standing balance-Leahy Scale: Fair                              ADL either performed or assessed with clinical judgement   ADL Overall ADL's : Needs assistance/impaired Eating/Feeding: Set up;Sitting   Grooming: Supervision/safety;Standing   Upper Body Bathing: Supervision/ safety;Sitting   Lower Body Bathing: Supervison/ safety;Sit to/from stand   Upper Body Dressing : Supervision/safety;Sitting   Lower Body Dressing: Sit to/from stand;Supervision/safety Lower Body Dressing Details (indicate cue type and reason): Able to reach down and adjust socks Toilet Transfer: Supervision/safety;Ambulation   Toileting- Clothing Manipulation and Hygiene: Supervision/safety;Sit to/from stand       Functional mobility during ADLs: Supervision/safety General ADL Comments: Supervision overall despite coordination deficits.      Vision Patient Visual Report: No change from baseline Vision Assessment?: No apparent visual deficits Additional Comments: Able to complete functional tasks without difficulty.      Perception     Praxis Praxis Praxis tested?: Within functional limits    Pertinent Vitals/Pain Pain Assessment: No/denies pain     Hand Dominance Right   Extremity/Trunk Assessment Upper Extremity Assessment Upper Extremity Assessment: RUE deficits/detail;LUE deficits/detail RUE Deficits / Details: Decreased coordination but more successful than L.  LUE Deficits / Details: Decreased coordination of LUE greater than R. Overshooting/undershooting and difficulty with rapid alternating movement.    Lower Extremity Assessment Lower Extremity Assessment: Defer to PT evaluation       Communication Communication Communication: No difficulties   Cognition Arousal/Alertness: Awake/alert Behavior During Therapy: WFL for tasks assessed/performed Overall Cognitive Status: Within Functional Limits for tasks assessed  General Comments       Exercises     Shoulder Instructions       Home Living Family/patient expects to be discharged to:: Private residence Living Arrangements: Children Available Help at Discharge: Family;Available PRN/intermittently Type of Home: Apartment Home Access: Stairs to enter Entrance Stairs-Number of Steps: 13 Entrance Stairs-Rails: Right Home Layout: One level     Bathroom Shower/Tub: Teacher, early years/pre: Standard     Home Equipment: Grab bars - tub/shower          Prior Functioning/Environment Level of Independence: Independent        Comments: ADls, IADLs, working        OT Problem List: Decreased activity tolerance;Impaired balance (sitting and/or standing);Decreased coordination      OT Treatment/Interventions: Self-care/ADL training;Therapeutic exercise;Energy conservation;DME and/or AE instruction;Therapeutic activities;Patient/family education;Balance training    OT Goals(Current goals can be found in the care plan section) Acute Rehab OT Goals Patient Stated Goal: to get better  OT Goal Formulation: With patient Time For Goal Achievement: 12/04/16 Potential to Achieve Goals: Good ADL Goals Pt Will Perform Upper Body Dressing: with modified independence;sitting Pt Will Perform Lower Body Dressing: with modified independence;sit to/from stand Pt Will Transfer to Toilet: with modified independence;ambulating;regular height toilet Pt Will Perform Toileting - Clothing Manipulation and hygiene: with modified independence;sit to/from stand Pt/caregiver will Perform Home Exercise Program: Both right and left upper extremity;Left upper extremity;With written HEP provided;Independently (Increased B UE coordination wtih focus on L)  OT Frequency: Min 2X/week   Barriers to D/C:            Co-evaluation              AM-PAC PT "6 Clicks" Daily Activity     Outcome Measure Help from another person eating meals?: A Little Help from another person taking care of personal grooming?: A  Little Help from another person toileting, which includes using toliet, bedpan, or urinal?: A Little Help from another person bathing (including washing, rinsing, drying)?: A Little Help from another person to put on and taking off regular upper body clothing?: A Little Help from another person to put on and taking off regular lower body clothing?: A Little 6 Click Score: 18   End of Session Equipment Utilized During Treatment: Gait belt;Rolling walker  Activity Tolerance: Patient tolerated treatment well Patient left: in bed;with call bell/phone within reach;with family/visitor present  OT Visit Diagnosis: Unsteadiness on feet (R26.81);Ataxia, unspecified (R27.0)                Time: 1425-1445 OT Time Calculation (min): 20 min Charges:  OT General Charges $OT Visit: 1 Procedure OT Evaluation $OT Eval Moderate Complexity: 1 Procedure G-Codes:     Norman Herrlich, MS OTR/L  Pager: [place] A Shirely Toren 11/20/2016, 4:31 PM

## 2016-11-20 NOTE — Evaluation (Signed)
Physical Therapy Evaluation Patient Details Name: Tasha Ortiz MRN: [id number] DOB: [birth year] Today's Date: 11/20/2016   History of Present Illness  Pt is a 61 y/o female who was admitted for worsening headache and decreased balance. MRI revealed cerebellar mass. Pt scheduled to have surgery either 8/22 or 8/23. PMH includes breast cancer s/p mastectomy, HTN, and L portacath placement.   Clinical Impression  Pt admitted secondary to problem above with deficits below. PTA, pt was independent with functional mobility and still working. Upon eval, pt presenting with decreased balance and required supervision to min guard during gait with dynamic balance tasks. Pt to have surgery either tomorrow or Thursday, so will need to do re-eval following surgery to determine appropriate d/c recommendations. Will continue to follow acutely to maximize functional mobility independence and safety.     Follow Up Recommendations Other (comment) (TBD following surgery )    Equipment Recommendations  Other (comment) (TBD following surgery )    Recommendations for Other Services       Precautions / Restrictions Precautions Precautions: Fall Restrictions Weight Bearing Restrictions: No      Mobility  Bed Mobility Overal bed mobility: Modified Independent             General bed mobility comments: No external assist required.   Transfers Overall transfer level: Needs assistance Equipment used: None Transfers: Sit to/from Stand Sit to Stand: Min guard         General transfer comment: Min guard for safety.   Ambulation/Gait Ambulation/Gait assistance: Min guard;Supervision Ambulation Distance (Feet): 250 Feet Assistive device: None Gait Pattern/deviations: Step-through pattern;Decreased stride length;Drifts right/left Gait velocity: Decreased Gait velocity interpretation: Below normal speed for age/gender General Gait Details: Slow, overall steady gait. Able to speed up on  command, however, prefers to walk at slower space. Performed dynamic balance tasks during ambulation (see DGI below). Slight imbalance noted with horizontal/vertical head turns, and required min guard for steadying assist. Will reasses balance following surgery   Stairs            Wheelchair Mobility    Modified Rankin (Stroke Patients Only)       Balance Overall balance assessment: Needs assistance Sitting-balance support: No upper extremity supported;Feet supported Sitting balance-Leahy Scale: Good     Standing balance support: No upper extremity supported;During functional activity Standing balance-Leahy Scale: Fair                   Standardized Balance Assessment Standardized Balance Assessment : Dynamic Gait Index   Dynamic Gait Index Level Surface: Normal Change in Gait Speed: Mild Impairment Gait with Horizontal Head Turns: Mild Impairment Gait with Vertical Head Turns: Mild Impairment Step Around Obstacles: Normal       Pertinent Vitals/Pain Pain Assessment: No/denies pain    Home Living Family/patient expects to be discharged to:: Private residence Living Arrangements: Children Available Help at Discharge: Family;Available PRN/intermittently Type of Home: Apartment Home Access: Stairs to enter Entrance Stairs-Rails: Right Entrance Stairs-Number of Steps: 13 Home Layout: One level Home Equipment: Grab bars - tub/shower      Prior Function Level of Independence: Independent               Hand Dominance   Dominant Hand: Right    Extremity/Trunk Assessment   Upper Extremity Assessment Upper Extremity Assessment: Defer to OT evaluation    Lower Extremity Assessment Lower Extremity Assessment: RLE deficits/detail;LLE deficits/detail RLE Deficits / Details: Slight uncoordinated movements with alternating foot tapping. No abnormalities noted with heel  to shin test. Strength WNL. LLE Deficits / Details: Slight uncoordinated movements  with alternating foot tapping. No abnormalities noted with heel to shin test. Strength WNL.    Cervical / Trunk Assessment Cervical / Trunk Assessment: Normal  Communication   Communication: No difficulties  Cognition Arousal/Alertness: Awake/alert Behavior During Therapy: WFL for tasks assessed/performed Overall Cognitive Status: Within Functional Limits for tasks assessed                                        General Comments General comments (skin integrity, edema, etc.): Pt reporting she feels more off balance than normal. Will need to reassess following surgery on Wednesday or Thursday.     Exercises     Assessment/Plan    PT Assessment Patient needs continued PT services  PT Problem List Decreased balance;Decreased mobility;Decreased knowledge of use of DME;Decreased coordination;Decreased knowledge of precautions       PT Treatment Interventions DME instruction;Gait training;Stair training;Functional mobility training;Therapeutic activities;Therapeutic exercise;Balance training;Neuromuscular re-education;Patient/family education    PT Goals (Current goals can be found in the Care Plan section)  Acute Rehab PT Goals Patient Stated Goal: to get better  PT Goal Formulation: With patient Time For Goal Achievement: 11/27/16 Potential to Achieve Goals: Good    Frequency Min 3X/week   Barriers to discharge        Co-evaluation               AM-PAC PT "6 Clicks" Daily Activity  Outcome Measure Difficulty turning over in bed (including adjusting bedclothes, sheets and blankets)?: None Difficulty moving from lying on back to sitting on the side of the bed? : None Difficulty sitting down on and standing up from a chair with arms (e.g., wheelchair, bedside commode, etc,.)?: Unable Help needed moving to and from a bed to chair (including a wheelchair)?: A Little Help needed walking in hospital room?: A Little Help needed climbing 3-5 steps with a  railing? : A Little 6 Click Score: 18    End of Session Equipment Utilized During Treatment: Gait belt Activity Tolerance: Patient tolerated treatment well Patient left: in chair;with call bell/phone within reach Nurse Communication: Mobility status PT Visit Diagnosis: Unsteadiness on feet (R26.81)    Time: 1638-4665 PT Time Calculation (min) (ACUTE ONLY): 24 min   Charges:   PT Evaluation $PT Eval Low Complexity: 1 Low PT Treatments $Gait Training: 8-22 mins   PT G Codes:        Leighton Ruff, PT, DPT  Acute Rehabilitation Services  Pager: [phone]   Rudean Hitt 11/20/2016, 12:50 PM

## 2016-11-20 NOTE — Progress Notes (Signed)
Patient ID: Tasha Ortiz, female   DOB: 08/01/1955, 61 y.o.   MRN: [id number] Subjective:  The patient is alert and pleasant. She is in no apparent distress.  Objective: Vital signs in last 24 hours: Temp:  [98 F (36.7 C)-98.6 F (37 C)] 98 F (36.7 C) (08/21 0414) Pulse Rate:  [56-97] 79 (08/21 0414) Resp:  [13-18] 18 (08/21 0414) BP: (106-169)/(69-94) 106/69 (08/21 0414) SpO2:  [93 %-100 %] 100 % (08/21 0414) Weight:  [78 kg (171 lb 14.4 oz)-78 kg (172 lb)] 78 kg (171 lb 14.4 oz) (08/20 2230)  Intake/Output from previous day: 08/20 0701 - 08/21 0700 In: 174.3 [I.V.:174.3] Out: -  Intake/Output this shift: No intake/output data recorded.  Physical exam the patient is alert and oriented. She is moving all 4 extremities well.  Lab Results:  Recent Labs  11/19/16 1308  WBC 6.0  HGB 13.8  HCT 40.1  PLT 179   BMET  Recent Labs  11/19/16 1308  NA 142  K 3.9  CL 108  CO2 26  GLUCOSE 111*  BUN 12  CREATININE 0.66  CALCIUM 9.2    Studies/Results: Ct Chest W Contrast  Result Date: 11/19/2016 CLINICAL DATA:  Sinus congestion assessed with headache and dizziness x2 weeks. Intermittent nausea without vomiting. EXAM: CT CHEST, ABDOMEN, AND PELVIS WITH CONTRAST TECHNIQUE: Multidetector CT imaging of the chest, abdomen and pelvis was performed following the standard protocol during bolus administration of intravenous contrast. CONTRAST:  173mL ISOVUE-300 IOPAMIDOL (ISOVUE-300) INJECTION 61% COMPARISON:  10/26/2013 and 03/2012 CT abdomen and pelvis FINDINGS: CT CHEST FINDINGS Cardiovascular: Normal size heart without pericardial effusion. No large central pulmonary embolus. Minimal aortic atherosclerosis at the arch without aneurysm or dissection. Unremarkable central pulmonary vasculature. No pulmonary embolus noted. Mediastinum/Nodes: No enlarged mediastinal, hilar, or axillary lymph nodes. Thyroid gland, trachea, and esophagus demonstrate no significant findings. Right  axillary lymph node dissection clips are noted. Small fat containing left axillary lymph nodes without pathologic enlargement. Lungs/Pleura: Minimal scarring at the right lung apex. Right middle lobe and both lower lobes subsegmental atelectasis and/or scarring. Small subpleural nodule in the right lower lobe measuring 5.2 mm in average, stable in appearance relative to 2014 consistent with a benign finding. Minimal pleural thickening along the right major fissure. No pneumonic consolidation or dominant mass. No effusion or pneumothorax. Musculoskeletal: Left anterior chest wall port catheter with tip in the mid SVC. Status post right mastectomy. CT ABDOMEN PELVIS FINDINGS Hepatobiliary: Hepatic steatosis without space-occupying mass. No biliary dilatation. Normal gallbladder. Pancreas: Normal Spleen: Normal Adrenals/Urinary Tract: Normal bilateral adrenal glands. Symmetric enhancement of both kidneys without focal mass. No obstructive uropathy. The bladder is unremarkable. Stomach/Bowel: Contracted appearing stomach. Normal small bowel rotation without obstruction or inflammation. And a moderate amount of fecal retention is seen within large bowel consistent with constipation. The appendix is normal. Vascular/Lymphatic: Aortic atherosclerosis. No enlarged abdominal or pelvic lymph nodes. Reproductive: Uterus and bilateral adnexa are unremarkable. Other: No abdominal wall hernia or abnormality. No abdominopelvic ascites. Musculoskeletal: No acute or significant osseous findings. IMPRESSION: 1. Post right mastectomy and axillary lymph node dissection change. 2. No evidence of metastatic disease. 3. Stable right lower lobe subpleural nodule dating back to 2014 consistent with benign finding. 4. Moderate colonic stool burden consistent constipation. No bowel inflammation or obstruction. 5. Mild hepatic steatosis. Electronically Signed   By: Ashley Royalty M.D.   On: 11/19/2016 21:52   Mr Jeri Cos TM Contrast  Result  Date: 11/19/2016 CLINICAL DATA:  61 y/o  F; headache and dizziness for 2 weeks. Sinus congestion. History of right breast cancer post chemo and radiation. EXAM: MRI HEAD WITHOUT AND WITH CONTRAST TECHNIQUE: Multiplanar, multiecho pulse sequences of the brain and surrounding structures were obtained without and with intravenous contrast. CONTRAST:  15 cc MultiHance. COMPARISON:  03/27/2013 CT head FINDINGS: Brain: Enhancing mass centered within the inferior vermis with extension into right greater than left cerebellar hemispheres measuring 29 x 28 x 27 mm (AP x ML x CC series 14, image 14 and series 15, image 8). The there is surrounding T2 FLAIR hyperintense signal abnormality which extends throughout the vermis and right greater than left cerebellar hemispheres as well as into the right brachium pontis compatible with edema. Local mass effect partially effaces the fourth ventricle and right quadrigeminal plate cistern. No evidence for acute/early subacute infarct of the brain, acute intracranial hemorrhage, or additional enhancing lesion. No hydrocephalus at this time. Background of mild chronic microvascular ischemic changes and parenchymal volume loss of the brain. Vascular: Normal flow voids. Skull and upper cervical spine: Normal marrow signal. Sinuses/Orbits: The mild ethmoid sinus mucosal thickening. No abnormal signal of mastoid air cells. Normal orbits. Other: None. IMPRESSION: 29 mm mass, likely metastasis, within the inferior vermis with surrounding edema and local mass effect partially effacing the fourth ventricle. No hydrocephalus at this time, but the patient is at high risk for developing obstructive hydrocephalus as mass effect progresses. These results were called by telephone at the time of interpretation on 11/19/2016 at 7:36 pm to Dr. Billy Fischer, who verbally acknowledged these results. Electronically Signed   By: Kristine Garbe M.D.   On: 11/19/2016 19:43   Ct Abdomen Pelvis W  Contrast  Result Date: 11/19/2016 CLINICAL DATA:  Sinus congestion assessed with headache and dizziness x2 weeks. Intermittent nausea without vomiting. EXAM: CT CHEST, ABDOMEN, AND PELVIS WITH CONTRAST TECHNIQUE: Multidetector CT imaging of the chest, abdomen and pelvis was performed following the standard protocol during bolus administration of intravenous contrast. CONTRAST:  123mL ISOVUE-300 IOPAMIDOL (ISOVUE-300) INJECTION 61% COMPARISON:  10/26/2013 and 03/2012 CT abdomen and pelvis FINDINGS: CT CHEST FINDINGS Cardiovascular: Normal size heart without pericardial effusion. No large central pulmonary embolus. Minimal aortic atherosclerosis at the arch without aneurysm or dissection. Unremarkable central pulmonary vasculature. No pulmonary embolus noted. Mediastinum/Nodes: No enlarged mediastinal, hilar, or axillary lymph nodes. Thyroid gland, trachea, and esophagus demonstrate no significant findings. Right axillary lymph node dissection clips are noted. Small fat containing left axillary lymph nodes without pathologic enlargement. Lungs/Pleura: Minimal scarring at the right lung apex. Right middle lobe and both lower lobes subsegmental atelectasis and/or scarring. Small subpleural nodule in the right lower lobe measuring 5.2 mm in average, stable in appearance relative to 2014 consistent with a benign finding. Minimal pleural thickening along the right major fissure. No pneumonic consolidation or dominant mass. No effusion or pneumothorax. Musculoskeletal: Left anterior chest wall port catheter with tip in the mid SVC. Status post right mastectomy. CT ABDOMEN PELVIS FINDINGS Hepatobiliary: Hepatic steatosis without space-occupying mass. No biliary dilatation. Normal gallbladder. Pancreas: Normal Spleen: Normal Adrenals/Urinary Tract: Normal bilateral adrenal glands. Symmetric enhancement of both kidneys without focal mass. No obstructive uropathy. The bladder is unremarkable. Stomach/Bowel: Contracted  appearing stomach. Normal small bowel rotation without obstruction or inflammation. And a moderate amount of fecal retention is seen within large bowel consistent with constipation. The appendix is normal. Vascular/Lymphatic: Aortic atherosclerosis. No enlarged abdominal or pelvic lymph nodes. Reproductive: Uterus and bilateral adnexa are unremarkable. Other: No abdominal wall hernia or  abnormality. No abdominopelvic ascites. Musculoskeletal: No acute or significant osseous findings. IMPRESSION: 1. Post right mastectomy and axillary lymph node dissection change. 2. No evidence of metastatic disease. 3. Stable right lower lobe subpleural nodule dating back to 2014 consistent with benign finding. 4. Moderate colonic stool burden consistent constipation. No bowel inflammation or obstruction. 5. Mild hepatic steatosis. Electronically Signed   By: Ashley Royalty M.D.   On: 11/19/2016 21:52    Assessment/Plan: Cerebellar tumor: The patient's metastatic workup is otherwise negative. I have answered all her questions regarding surgery. We will plan to do the surgery either tomorrow or Thursday.  LOS: 1 day     Malie Kashani D 11/20/2016, 7:37 AM

## 2016-11-20 NOTE — Care Management Note (Signed)
Case Management Note  Patient Details  Name: Tasha Ortiz MRN: [id number] Date of Birth: Jun 03, 1955  Subjective/Objective:  Pt in with brain mets. She is from home with family.                   Action/Plan: Plan is for OR tomorrow or Thursday. Awaiting PT evals post surgery. CM following for d/c needs.   Expected Discharge Date:                  Expected Discharge Plan:     In-House Referral:     Discharge planning Services     Post Acute Care Choice:    Choice offered to:     DME Arranged:    DME Agency:     HH Arranged:    HH Agency:     Status of Service:  In process, will continue to follow  If discussed at Long Length of Stay Meetings, dates discussed:    Additional Comments:  Pollie Friar, RN 11/20/2016, 10:14 AM

## 2016-11-20 NOTE — Progress Notes (Signed)
PROGRESS NOTE    DEVAEH AMADI  PTW:[id number] DOB: [birth year] DOA: 11/19/2016 PCP: Golden Circle, FNP   Brief Narrative: Tasha Ortiz is a 61 y.o. female with medical history significant of breast cancer, HTN, GERD, hypercholesteremia and other comorbids who presented with 2 weeks history of progressively worsening vertigo, ataxia, and occasional headaches. At first it was thought to be secondary to sinusitis allergies she was evaluating her primary care provider was prescribed vertigo medications the setting improvement. She had consistent posterior nasal drainage and congestion meclizine does not seem to help. She did not have any diplopia. No localized weakness in upper or lower extremity. Denies tinnitus no history of stroke. Reports good appetite. No weight loss. She reports last EGD/colonoscopy was in 2015 Due to recurrent abdominal pain. EGD showed small hiatal hernia and colonoscopy was normal. She presented to emergency department with the symptoms above and MRI scan of the brain found new brain mass possibly metastatic. She was admitted to [facility] for further evaluation with consult by Neurosurgery. Patient underwent Imaging of Chest, Abdomen and Pelvis and revealed no Metastatic Disease. Neurosurgery planning surgical intervention on either 11/21/16 or 11/22/16.    Assessment & Plan:   Active Problems:   Hyperlipidemia   Essential hypertension, benign   Allergic rhinitis   Acid reflux   Primary cancer of upper outer quadrant of right female breast (HCC)   Constipation   Vertigo   Brain mass   Fatty liver disease, nonalcoholic  Worsening Vertigo/Ataxia/Headaches 2/2 to Brain Mass -Admitted to Neuro Floor -MRI Brain showed 29 mm mass, likely metastasis, within the inferior vermis with surrounding edema and local mass effect partially effacing the fourth ventricle. No hydrocephalus at this time, but the patient is at high risk for developing obstructive hydrocephalus  as mass effect progresses. -Given IV Dexamethasone 10 mg 1x. C/w Dexamethasone IV 4 mg q8h; Started Protonix 40 mg po Daily for GI Prophylaxis -C/w Meclizine 12.5 mg po TIDprn Dizziness -Given 5 mg po Diazepam once  -Neurosurgery Consulted and planning on Surgery in AM or Thursday -CT Chest/Abd/Pelvis reveal no Metastatic Disease  Essential HTN -C/w Amlodipine 10 mg po Daily  HLD -Check FLP -C/w Pravastatin 40 mg po Daily  GERD/GI Prophylaxis  -Started po Pantoprazole 40 mg po Daily for GI  Fatty Liver Disease -Monitor LFT's; AST was 25 and ALT was 20 -Outpatient U/S with PCP  -Found on CT Scan  Seasonal Allergies/Sinusitis/Allergic Rhinitis  -C/w Loratadine 10 mg po Daily  Hx of Breast Cancer s/p Right Mastectomy/Chemothearpy/XRT in 2014 -Currently in Remission -CT Chest showed Post right mastectomy and axillary lymph node dissection change and no evidence of metastatic disease -C/w Tamoxifen 20 mg po Daily  -C/w Mammograms as regularly scheduled -Continue to follow up with Outpatient Oncology Dr. Nicholas Lose  Constipation -Patient was found to have Moderate colonic stool burden consistent constipation. No bowel inflammation or obstruction. -Started Senna-Docusate 1 tab po BID  DVT prophylaxis: SCDs Code Status: FULL CODE Family Communication: Discussed with Daughters at bedside Disposition Plan: Remain Inpatient for possible Surgery  Consultants:   Neurosurgery Dr. [name]  Procedures: Neurosurgical Intervention either on 8/22 or 8/23   Antimicrobials:  Anti-infectives    None     Subjective: Seen and examined at bedside and was doing well. Stated she had some trouble walking and mild dizziness. No nausea or vomiting. No CP or Shortness of Breath.   Objective: Vitals:   11/19/16 2230 11/20/16 0126 11/20/16 0414 11/20/16 0938  BP:  133/84 106/69 121/77  Pulse: 70 74 79 72  Resp: 16 16 18 20   Temp: 98.5 F (36.9 C) 98.1 F (36.7 C) 98 F (36.7 C) 98.7  F (37.1 C)  TempSrc: Oral Oral Oral Oral  SpO2:  98% 100% 99%  Weight: 78 kg (171 lb 14.4 oz)     Height: 5\' 4"  (1.626 m)       Intake/Output Summary (Last 24 hours) at 11/20/16 1239 Last data filed at 11/19/16 2115  Gross per 24 hour  Intake           174.33 ml  Output                0 ml  Net           174.33 ml   Filed Weights   11/19/16 1205 11/19/16 2230  Weight: 78 kg (172 lb) 78 kg (171 lb 14.4 oz)   Examination: Physical Exam:  Constitutional: WN/WD obese AAF in NAD and appears calm and comfortable Eyes: Lids and conjunctivae normal, sclerae anicteric  ENMT: External Ears, Nose appear normal. Grossly normal hearing. Mucous membranes are moist.   Neck: Appears normal, supple, no cervical masses, normal ROM, no appreciable thyromegaly, no JVD Respiratory: Clear to auscultation bilaterally, no wheezing, rales, rhonchi or crackles. Normal respiratory effort and patient is not tachypenic. No accessory muscle use.  Cardiovascular: RRR, no murmurs / rubs / gallops. S1 and S2 auscultated. No extremity edema. Abdomen: Soft, non-tender, non-distended. No masses palpated. No appreciable hepatosplenomegaly. Bowel sounds positive x4.  GU: Deferred. Musculoskeletal: No clubbing / cyanosis of digits/nails. No joint deformity upper and lower extremities. Good ROM, no contractures.  Skin: No rashes, lesions, ulcers on a limited skin eval. No induration; Warm and dry.  Neurologic: CN 2-12 grossly intact with no focal deficits. Sensation intact in all 4 Extremities. Romberg sign cerebellar reflexes not assessed.  Psychiatric: Normal judgment and insight. Alert and oriented x 3. Normal mood and appropriate affect.   Data Reviewed: I have personally reviewed following labs and imaging studies  CBC:  Recent Labs Lab 11/19/16 1308  WBC 6.0  NEUTROABS 3.2  HGB 13.8  HCT 40.1  MCV 86.8  PLT 563   Basic Metabolic Panel:  Recent Labs Lab 11/19/16 1308  NA 142  K 3.9  CL 108    CO2 26  GLUCOSE 111*  BUN 12  CREATININE 0.66  CALCIUM 9.2   GFR: Estimated Creatinine Clearance: 76.5 mL/min (by C-G formula based on SCr of 0.66 mg/dL). Liver Function Tests:  Recent Labs Lab 11/19/16 1308  AST 25  ALT 20  ALKPHOS 43  BILITOT 0.4  PROT 7.5  ALBUMIN 3.9   No results for input(s): LIPASE, AMYLASE in the last 168 hours. No results for input(s): AMMONIA in the last 168 hours. Coagulation Profile: No results for input(s): INR, PROTIME in the last 168 hours. Cardiac Enzymes: No results for input(s): CKTOTAL, CKMB, CKMBINDEX, TROPONINI in the last 168 hours. BNP (last 3 results) No results for input(s): PROBNP in the last 8760 hours. HbA1C: No results for input(s): HGBA1C in the last 72 hours. CBG: No results for input(s): GLUCAP in the last 168 hours. Lipid Profile: No results for input(s): CHOL, HDL, LDLCALC, TRIG, CHOLHDL, LDLDIRECT in the last 72 hours. Thyroid Function Tests: No results for input(s): TSH, T4TOTAL, FREET4, T3FREE, THYROIDAB in the last 72 hours. Anemia Panel: No results for input(s): VITAMINB12, FOLATE, FERRITIN, TIBC, IRON, RETICCTPCT in the last 72 hours. Sepsis Labs: No  results for input(s): PROCALCITON, LATICACIDVEN in the last 168 hours.  No results found for this or any previous visit (from the past 240 hour(s)).   Radiology Studies: Ct Chest W Contrast  Result Date: 11/19/2016 CLINICAL DATA:  Sinus congestion assessed with headache and dizziness x2 weeks. Intermittent nausea without vomiting. EXAM: CT CHEST, ABDOMEN, AND PELVIS WITH CONTRAST TECHNIQUE: Multidetector CT imaging of the chest, abdomen and pelvis was performed following the standard protocol during bolus administration of intravenous contrast. CONTRAST:  18mL ISOVUE-300 IOPAMIDOL (ISOVUE-300) INJECTION 61% COMPARISON:  10/26/2013 and 03/2012 CT abdomen and pelvis FINDINGS: CT CHEST FINDINGS Cardiovascular: Normal size heart without pericardial effusion. No large  central pulmonary embolus. Minimal aortic atherosclerosis at the arch without aneurysm or dissection. Unremarkable central pulmonary vasculature. No pulmonary embolus noted. Mediastinum/Nodes: No enlarged mediastinal, hilar, or axillary lymph nodes. Thyroid gland, trachea, and esophagus demonstrate no significant findings. Right axillary lymph node dissection clips are noted. Small fat containing left axillary lymph nodes without pathologic enlargement. Lungs/Pleura: Minimal scarring at the right lung apex. Right middle lobe and both lower lobes subsegmental atelectasis and/or scarring. Small subpleural nodule in the right lower lobe measuring 5.2 mm in average, stable in appearance relative to 2014 consistent with a benign finding. Minimal pleural thickening along the right major fissure. No pneumonic consolidation or dominant mass. No effusion or pneumothorax. Musculoskeletal: Left anterior chest wall port catheter with tip in the mid SVC. Status post right mastectomy. CT ABDOMEN PELVIS FINDINGS Hepatobiliary: Hepatic steatosis without space-occupying mass. No biliary dilatation. Normal gallbladder. Pancreas: Normal Spleen: Normal Adrenals/Urinary Tract: Normal bilateral adrenal glands. Symmetric enhancement of both kidneys without focal mass. No obstructive uropathy. The bladder is unremarkable. Stomach/Bowel: Contracted appearing stomach. Normal small bowel rotation without obstruction or inflammation. And a moderate amount of fecal retention is seen within large bowel consistent with constipation. The appendix is normal. Vascular/Lymphatic: Aortic atherosclerosis. No enlarged abdominal or pelvic lymph nodes. Reproductive: Uterus and bilateral adnexa are unremarkable. Other: No abdominal wall hernia or abnormality. No abdominopelvic ascites. Musculoskeletal: No acute or significant osseous findings. IMPRESSION: 1. Post right mastectomy and axillary lymph node dissection change. 2. No evidence of metastatic  disease. 3. Stable right lower lobe subpleural nodule dating back to 2014 consistent with benign finding. 4. Moderate colonic stool burden consistent constipation. No bowel inflammation or obstruction. 5. Mild hepatic steatosis. Electronically Signed   By: Ashley Royalty M.D.   On: 11/19/2016 21:52   Mr Jeri Cos TW Contrast  Result Date: 11/19/2016 CLINICAL DATA:  61 y/o F; headache and dizziness for 2 weeks. Sinus congestion. History of right breast cancer post chemo and radiation. EXAM: MRI HEAD WITHOUT AND WITH CONTRAST TECHNIQUE: Multiplanar, multiecho pulse sequences of the brain and surrounding structures were obtained without and with intravenous contrast. CONTRAST:  15 cc MultiHance. COMPARISON:  03/27/2013 CT head FINDINGS: Brain: Enhancing mass centered within the inferior vermis with extension into right greater than left cerebellar hemispheres measuring 29 x 28 x 27 mm (AP x ML x CC series 14, image 14 and series 15, image 8). The there is surrounding T2 FLAIR hyperintense signal abnormality which extends throughout the vermis and right greater than left cerebellar hemispheres as well as into the right brachium pontis compatible with edema. Local mass effect partially effaces the fourth ventricle and right quadrigeminal plate cistern. No evidence for acute/early subacute infarct of the brain, acute intracranial hemorrhage, or additional enhancing lesion. No hydrocephalus at this time. Background of mild chronic microvascular ischemic changes and parenchymal  volume loss of the brain. Vascular: Normal flow voids. Skull and upper cervical spine: Normal marrow signal. Sinuses/Orbits: The mild ethmoid sinus mucosal thickening. No abnormal signal of mastoid air cells. Normal orbits. Other: None. IMPRESSION: 29 mm mass, likely metastasis, within the inferior vermis with surrounding edema and local mass effect partially effacing the fourth ventricle. No hydrocephalus at this time, but the patient is at high risk  for developing obstructive hydrocephalus as mass effect progresses. These results were called by telephone at the time of interpretation on 11/19/2016 at 7:36 pm to Dr. Billy Fischer, who verbally acknowledged these results. Electronically Signed   By: Kristine Garbe M.D.   On: 11/19/2016 19:43   Ct Abdomen Pelvis W Contrast  Result Date: 11/19/2016 CLINICAL DATA:  Sinus congestion assessed with headache and dizziness x2 weeks. Intermittent nausea without vomiting. EXAM: CT CHEST, ABDOMEN, AND PELVIS WITH CONTRAST TECHNIQUE: Multidetector CT imaging of the chest, abdomen and pelvis was performed following the standard protocol during bolus administration of intravenous contrast. CONTRAST:  158mL ISOVUE-300 IOPAMIDOL (ISOVUE-300) INJECTION 61% COMPARISON:  10/26/2013 and 03/2012 CT abdomen and pelvis FINDINGS: CT CHEST FINDINGS Cardiovascular: Normal size heart without pericardial effusion. No large central pulmonary embolus. Minimal aortic atherosclerosis at the arch without aneurysm or dissection. Unremarkable central pulmonary vasculature. No pulmonary embolus noted. Mediastinum/Nodes: No enlarged mediastinal, hilar, or axillary lymph nodes. Thyroid gland, trachea, and esophagus demonstrate no significant findings. Right axillary lymph node dissection clips are noted. Small fat containing left axillary lymph nodes without pathologic enlargement. Lungs/Pleura: Minimal scarring at the right lung apex. Right middle lobe and both lower lobes subsegmental atelectasis and/or scarring. Small subpleural nodule in the right lower lobe measuring 5.2 mm in average, stable in appearance relative to 2014 consistent with a benign finding. Minimal pleural thickening along the right major fissure. No pneumonic consolidation or dominant mass. No effusion or pneumothorax. Musculoskeletal: Left anterior chest wall port catheter with tip in the mid SVC. Status post right mastectomy. CT ABDOMEN PELVIS FINDINGS Hepatobiliary:  Hepatic steatosis without space-occupying mass. No biliary dilatation. Normal gallbladder. Pancreas: Normal Spleen: Normal Adrenals/Urinary Tract: Normal bilateral adrenal glands. Symmetric enhancement of both kidneys without focal mass. No obstructive uropathy. The bladder is unremarkable. Stomach/Bowel: Contracted appearing stomach. Normal small bowel rotation without obstruction or inflammation. And a moderate amount of fecal retention is seen within large bowel consistent with constipation. The appendix is normal. Vascular/Lymphatic: Aortic atherosclerosis. No enlarged abdominal or pelvic lymph nodes. Reproductive: Uterus and bilateral adnexa are unremarkable. Other: No abdominal wall hernia or abnormality. No abdominopelvic ascites. Musculoskeletal: No acute or significant osseous findings. IMPRESSION: 1. Post right mastectomy and axillary lymph node dissection change. 2. No evidence of metastatic disease. 3. Stable right lower lobe subpleural nodule dating back to 2014 consistent with benign finding. 4. Moderate colonic stool burden consistent constipation. No bowel inflammation or obstruction. 5. Mild hepatic steatosis. Electronically Signed   By: Ashley Royalty M.D.   On: 11/19/2016 21:52   Scheduled Meds: . amLODipine  10 mg Oral Daily  . dexamethasone  4 mg Intravenous Q8H  . loratadine  10 mg Oral Daily  . pantoprazole  40 mg Oral Daily  . pravastatin  40 mg Oral Daily  . senna-docusate  1 tablet Oral BID  . tamoxifen  20 mg Oral Daily   Continuous Infusions: . sodium chloride 1,000 mL (11/19/16 2115)    LOS: 1 day   Kerney Elbe, DO [facility] Pager [phone]  If 7PM-7AM, please contact night-coverage [email or web address] Password  [email or web address] 11/20/2016, 12:39 PM

## 2016-11-21 ENCOUNTER — Inpatient Hospital Stay (HOSPITAL_COMMUNITY): Payer: BLUE CROSS/BLUE SHIELD | Admitting: Anesthesiology

## 2016-11-21 ENCOUNTER — Inpatient Hospital Stay (HOSPITAL_COMMUNITY): Payer: BLUE CROSS/BLUE SHIELD

## 2016-11-21 ENCOUNTER — Encounter (HOSPITAL_COMMUNITY): Payer: Self-pay | Admitting: *Deleted

## 2016-11-21 ENCOUNTER — Encounter (HOSPITAL_COMMUNITY)
Admission: EM | Disposition: A | Discharge status: Rehabilitation Facility | Payer: Self-pay | Source: Home / Self Care | Attending: Neurosurgery

## 2016-11-21 DIAGNOSIS — C801 Malignant (primary) neoplasm, unspecified: Secondary | ICD-10-CM

## 2016-11-21 DIAGNOSIS — C7931 Secondary malignant neoplasm of brain: Principal | ICD-10-CM

## 2016-11-21 HISTORY — PX: SUBOCCIPITAL CRANIECTOMY CERVICAL LAMINECTOMY: SHX5404

## 2016-11-21 LAB — CBC WITH DIFFERENTIAL/PLATELET
Basophils Absolute: 0 10*3/uL (ref 0.0–0.1)
Basophils Relative: 0 %
Eosinophils Absolute: 0 10*3/uL (ref 0.0–0.7)
Eosinophils Relative: 0 %
HCT: 39.3 % (ref 36.0–46.0)
Hemoglobin: 13.2 g/dL (ref 12.0–15.0)
Lymphocytes Relative: 6 %
Lymphs Abs: 1.2 10*3/uL (ref 0.7–4.0)
MCH: 29.5 pg (ref 26.0–34.0)
MCHC: 33.6 g/dL (ref 30.0–36.0)
MCV: 87.9 fL (ref 78.0–100.0)
Monocytes Absolute: 0.4 10*3/uL (ref 0.1–1.0)
Monocytes Relative: 2 %
Neutro Abs: 16.9 10*3/uL — ABNORMAL HIGH (ref 1.7–7.7)
Neutrophils Relative %: 92 %
Platelets: 229 10*3/uL (ref 150–400)
RBC: 4.47 MIL/uL (ref 3.87–5.11)
RDW: 13.6 % (ref 11.5–15.5)
WBC: 18.5 10*3/uL — ABNORMAL HIGH (ref 4.0–10.5)

## 2016-11-21 LAB — COMPREHENSIVE METABOLIC PANEL
ALT: 22 U/L (ref 14–54)
AST: 25 U/L (ref 15–41)
Albumin: 3.8 g/dL (ref 3.5–5.0)
Alkaline Phosphatase: 50 U/L (ref 38–126)
Anion gap: 10 (ref 5–15)
BUN: 17 mg/dL (ref 6–20)
CO2: 21 mmol/L — ABNORMAL LOW (ref 22–32)
Calcium: 9.6 mg/dL (ref 8.9–10.3)
Chloride: 110 mmol/L (ref 101–111)
Creatinine, Ser: 0.91 mg/dL (ref 0.44–1.00)
GFR calc Af Amer: >60 mL/min (ref >60)
GFR calc non Af Amer: >60 mL/min (ref >60)
Glucose, Bld: 243 mg/dL — ABNORMAL HIGH (ref 65–99)
Potassium: 4.4 mmol/L (ref 3.5–5.1)
Sodium: 141 mmol/L (ref 135–145)
Total Bilirubin: 0.3 mg/dL (ref 0.3–1.2)
Total Protein: 7.2 g/dL (ref 6.5–8.1)

## 2016-11-21 LAB — SURGICAL PCR SCREEN
MRSA, PCR: POSITIVE — AB
Staphylococcus aureus: POSITIVE — AB

## 2016-11-21 LAB — ABO/RH: ABO/RH(D): B POS

## 2016-11-21 LAB — PHOSPHORUS: Phosphorus: 2.9 mg/dL (ref 2.5–4.6)

## 2016-11-21 LAB — LIPID PANEL
Cholesterol: 179 mg/dL (ref 0–200)
HDL: 51 mg/dL (ref >40)
LDL Cholesterol: 119 mg/dL — ABNORMAL HIGH (ref 0–99)
Total CHOL/HDL Ratio: 3.5 RATIO
Triglycerides: 44 mg/dL (ref <150)
VLDL: 9 mg/dL (ref 0–40)

## 2016-11-21 LAB — TSH: TSH: 2.031 u[IU]/mL (ref 0.350–4.500)

## 2016-11-21 LAB — MAGNESIUM: Magnesium: 2.3 mg/dL (ref 1.7–2.4)

## 2016-11-21 LAB — HIV ANTIBODY (ROUTINE TESTING W REFLEX): HIV Screen 4th Generation wRfx: NONREACTIVE

## 2016-11-21 LAB — PREPARE RBC (CROSSMATCH)

## 2016-11-21 SURGERY — SUBOCCIPITAL CRANIECTOMY CERVICAL LAMINECTOMY/DURAPLASTY
Anesthesia: General | Site: Head

## 2016-11-21 MED ORDER — BACITRACIN ZINC 500 UNIT/GM EX OINT
TOPICAL_OINTMENT | CUTANEOUS | Status: DC | PRN
  Administered 2016-11-21: 1 via TOPICAL

## 2016-11-21 MED ORDER — LIDOCAINE HCL (CARDIAC) 20 MG/ML IV SOLN
INTRAVENOUS | Status: DC | PRN
  Administered 2016-11-21: 100 mg via INTRAVENOUS

## 2016-11-21 MED ORDER — DOCUSATE SODIUM 100 MG PO CAPS
100.0000 mg | ORAL_CAPSULE | Freq: Two times a day (BID) | ORAL | Status: DC
  Administered 2016-11-21 – 2016-11-24 (×6): 100 mg via ORAL
  Filled 2016-11-21 (×6): qty 1

## 2016-11-21 MED ORDER — 0.9 % SODIUM CHLORIDE (POUR BTL) OPTIME
TOPICAL | Status: DC | PRN
  Administered 2016-11-21 (×3): 1000 mL

## 2016-11-21 MED ORDER — MIDAZOLAM HCL 5 MG/5ML IJ SOLN
INTRAMUSCULAR | Status: DC | PRN
  Administered 2016-11-21: 1 mg via INTRAVENOUS

## 2016-11-21 MED ORDER — SODIUM CHLORIDE 0.9 % IV SOLN: Freq: Once | INTRAVENOUS | Status: DC

## 2016-11-21 MED ORDER — CHLORHEXIDINE GLUCONATE CLOTH 2 % EX PADS
6.0000 | MEDICATED_PAD | Freq: Every day | CUTANEOUS | Status: DC
  Administered 2016-11-21 – 2016-11-24 (×4): 6 via TOPICAL

## 2016-11-21 MED ORDER — ALBUMIN HUMAN 5 % IV SOLN
INTRAVENOUS | Status: DC | PRN
  Administered 2016-11-21: 15:00:00 via INTRAVENOUS

## 2016-11-21 MED ORDER — ONDANSETRON HCL 4 MG/2ML IJ SOLN
4.0000 mg | INTRAMUSCULAR | Status: DC | PRN
  Administered 2016-11-21 – 2016-11-22 (×3): 4 mg via INTRAVENOUS
  Filled 2016-11-21 (×3): qty 2

## 2016-11-21 MED ORDER — ALTEPLASE 2 MG IJ SOLR
2.0000 mg | Freq: Once | INTRAMUSCULAR | Status: AC
  Administered 2016-11-21: 2 mg
  Filled 2016-11-21: qty 2

## 2016-11-21 MED ORDER — HYDROMORPHONE HCL 1 MG/ML IJ SOLN: 0.2500 mg | INTRAMUSCULAR | Status: DC | PRN

## 2016-11-21 MED ORDER — FENTANYL CITRATE (PF) 100 MCG/2ML IJ SOLN
INTRAMUSCULAR | Status: DC | PRN
  Administered 2016-11-21 (×4): 50 ug via INTRAVENOUS

## 2016-11-21 MED ORDER — ROCURONIUM BROMIDE 100 MG/10ML IV SOLN
INTRAVENOUS | Status: DC | PRN
  Administered 2016-11-21: 60 mg via INTRAVENOUS
  Administered 2016-11-21 (×2): 10 mg via INTRAVENOUS

## 2016-11-21 MED ORDER — MIDAZOLAM HCL 2 MG/2ML IJ SOLN
INTRAMUSCULAR | Status: AC
  Filled 2016-11-21: qty 2

## 2016-11-21 MED ORDER — THROMBIN 5000 UNITS EX SOLR
CUTANEOUS | Status: AC
  Filled 2016-11-21: qty 5000

## 2016-11-21 MED ORDER — MORPHINE SULFATE (PF) 4 MG/ML IV SOLN
1.0000 mg | INTRAVENOUS | Status: DC | PRN
  Administered 2016-11-22 (×2): 2 mg via INTRAVENOUS
  Filled 2016-11-21 (×2): qty 1

## 2016-11-21 MED ORDER — SODIUM CHLORIDE 0.9 % IV SOLN
INTRAVENOUS | Status: DC | PRN
  Administered 2016-11-21: 13:00:00 via INTRAVENOUS

## 2016-11-21 MED ORDER — SUGAMMADEX SODIUM 200 MG/2ML IV SOLN
INTRAVENOUS | Status: AC
  Filled 2016-11-21: qty 2

## 2016-11-21 MED ORDER — DEXAMETHASONE 4 MG PO TABS: 4.0000 mg | ORAL_TABLET | Freq: Four times a day (QID) | ORAL | Status: DC

## 2016-11-21 MED ORDER — CEFAZOLIN SODIUM-DEXTROSE 2-4 GM/100ML-% IV SOLN
2.0000 g | Freq: Three times a day (TID) | INTRAVENOUS | Status: AC
  Administered 2016-11-22 (×2): 2 g via INTRAVENOUS
  Filled 2016-11-21 (×2): qty 100

## 2016-11-21 MED ORDER — MUPIROCIN 2 % EX OINT
1.0000 "application " | TOPICAL_OINTMENT | Freq: Two times a day (BID) | CUTANEOUS | Status: DC
  Administered 2016-11-21 – 2016-11-24 (×7): 1 via NASAL
  Filled 2016-11-21 (×2): qty 22

## 2016-11-21 MED ORDER — PANTOPRAZOLE SODIUM 40 MG IV SOLR: 40.0000 mg | Freq: Every day | INTRAVENOUS | Status: DC

## 2016-11-21 MED ORDER — HYDROCODONE-ACETAMINOPHEN 5-325 MG PO TABS
1.0000 | ORAL_TABLET | ORAL | Status: DC | PRN
  Administered 2016-11-22 (×3): 1 via ORAL
  Filled 2016-11-21 (×3): qty 1

## 2016-11-21 MED ORDER — POTASSIUM CHLORIDE IN NACL 20-0.9 MEQ/L-% IV SOLN
INTRAVENOUS | Status: DC
  Administered 2016-11-21 – 2016-11-23 (×4): via INTRAVENOUS
  Administered 2016-11-24: 1000 mL via INTRAVENOUS
  Filled 2016-11-21 (×7): qty 1000

## 2016-11-21 MED ORDER — DEXAMETHASONE SODIUM PHOSPHATE 10 MG/ML IJ SOLN
INTRAMUSCULAR | Status: DC | PRN
  Administered 2016-11-21: 10 mg via INTRAVENOUS

## 2016-11-21 MED ORDER — CEFAZOLIN SODIUM 1 G IJ SOLR
INTRAMUSCULAR | Status: AC
  Filled 2016-11-21: qty 20

## 2016-11-21 MED ORDER — ONDANSETRON HCL 4 MG/2ML IJ SOLN
INTRAMUSCULAR | Status: AC
  Filled 2016-11-21: qty 2

## 2016-11-21 MED ORDER — PROMETHAZINE HCL 25 MG/ML IJ SOLN
6.2500 mg | Freq: Once | INTRAMUSCULAR | Status: AC
  Administered 2016-11-21: 6.25 mg via INTRAVENOUS

## 2016-11-21 MED ORDER — THROMBIN 20000 UNITS EX SOLR
CUTANEOUS | Status: DC | PRN
  Administered 2016-11-21: 20 mL via TOPICAL

## 2016-11-21 MED ORDER — ACETAMINOPHEN 325 MG PO TABS: 650.0000 mg | ORAL_TABLET | ORAL | Status: DC | PRN

## 2016-11-21 MED ORDER — PROPOFOL 10 MG/ML IV BOLUS
INTRAVENOUS | Status: AC
  Filled 2016-11-21: qty 20

## 2016-11-21 MED ORDER — DEXAMETHASONE 4 MG PO TABS: 4.0000 mg | ORAL_TABLET | Freq: Three times a day (TID) | ORAL | Status: DC

## 2016-11-21 MED ORDER — LIDOCAINE 2% (20 MG/ML) 5 ML SYRINGE
INTRAMUSCULAR | Status: AC
  Filled 2016-11-21: qty 5

## 2016-11-21 MED ORDER — THROMBIN 20000 UNITS EX SOLR
CUTANEOUS | Status: AC
  Filled 2016-11-21: qty 20000

## 2016-11-21 MED ORDER — METOCLOPRAMIDE HCL 5 MG/ML IJ SOLN
10.0000 mg | Freq: Once | INTRAMUSCULAR | Status: AC
  Administered 2016-11-21: 10 mg via INTRAVENOUS

## 2016-11-21 MED ORDER — ACETAMINOPHEN 650 MG RE SUPP: 650.0000 mg | RECTAL | Status: DC | PRN

## 2016-11-21 MED ORDER — SUCCINYLCHOLINE CHLORIDE 200 MG/10ML IV SOSY
PREFILLED_SYRINGE | INTRAVENOUS | Status: AC
  Filled 2016-11-21: qty 10

## 2016-11-21 MED ORDER — PROMETHAZINE HCL 25 MG/ML IJ SOLN
INTRAMUSCULAR | Status: AC
  Filled 2016-11-21: qty 1

## 2016-11-21 MED ORDER — SUCCINYLCHOLINE CHLORIDE 20 MG/ML IJ SOLN
INTRAMUSCULAR | Status: DC | PRN
  Administered 2016-11-21: 120 mg via INTRAVENOUS

## 2016-11-21 MED ORDER — DEXAMETHASONE 6 MG PO TABS
6.0000 mg | ORAL_TABLET | Freq: Four times a day (QID) | ORAL | Status: DC
  Administered 2016-11-21 – 2016-11-22 (×3): 6 mg via ORAL
  Filled 2016-11-21 (×4): qty 1

## 2016-11-21 MED ORDER — MICROFIBRILLAR COLL HEMOSTAT EX PADS
MEDICATED_PAD | CUTANEOUS | Status: DC | PRN
  Administered 2016-11-21: 1 via TOPICAL

## 2016-11-21 MED ORDER — CEFAZOLIN SODIUM-DEXTROSE 2-3 GM-% IV SOLR
2.0000 g | Freq: Once | INTRAVENOUS | Status: AC
  Administered 2016-11-21 (×2): 2 g via INTRAVENOUS

## 2016-11-21 MED ORDER — SODIUM CHLORIDE 0.9 % IV SOLN
INTRAVENOUS | Status: DC | PRN
  Administered 2016-11-21 (×2): via INTRAVENOUS

## 2016-11-21 MED ORDER — ONDANSETRON HCL 4 MG/2ML IJ SOLN
INTRAMUSCULAR | Status: DC | PRN
  Administered 2016-11-21: 4 mg via INTRAVENOUS

## 2016-11-21 MED ORDER — BUPIVACAINE-EPINEPHRINE (PF) 0.5% -1:200000 IJ SOLN
INTRAMUSCULAR | Status: AC
  Filled 2016-11-21: qty 30

## 2016-11-21 MED ORDER — METOCLOPRAMIDE HCL 5 MG/ML IJ SOLN
INTRAMUSCULAR | Status: AC
  Administered 2016-11-21: 10 mg via INTRAVENOUS
  Filled 2016-11-21: qty 2

## 2016-11-21 MED ORDER — GELATIN ABSORBABLE MT POWD
OROMUCOSAL | Status: DC | PRN
  Administered 2016-11-21: 5 mL via TOPICAL

## 2016-11-21 MED ORDER — CEFAZOLIN SODIUM-DEXTROSE 2-4 GM/100ML-% IV SOLN
INTRAVENOUS | Status: AC
  Filled 2016-11-21: qty 100

## 2016-11-21 MED ORDER — ROCURONIUM BROMIDE 10 MG/ML (PF) SYRINGE
PREFILLED_SYRINGE | INTRAVENOUS | Status: AC
  Filled 2016-11-21: qty 5

## 2016-11-21 MED ORDER — FENTANYL CITRATE (PF) 100 MCG/2ML IJ SOLN
INTRAMUSCULAR | Status: AC
  Filled 2016-11-21: qty 2

## 2016-11-21 MED ORDER — FENTANYL CITRATE (PF) 250 MCG/5ML IJ SOLN
INTRAMUSCULAR | Status: AC
  Filled 2016-11-21: qty 5

## 2016-11-21 MED ORDER — ONDANSETRON HCL 4 MG/2ML IJ SOLN: INTRAMUSCULAR | Status: DC | PRN

## 2016-11-21 MED ORDER — SODIUM CHLORIDE 0.9 % IR SOLN
Status: DC | PRN
  Administered 2016-11-21: 500 mL

## 2016-11-21 MED ORDER — ONDANSETRON HCL 4 MG PO TABS: 4.0000 mg | ORAL_TABLET | ORAL | Status: DC | PRN

## 2016-11-21 MED ORDER — PROPOFOL 10 MG/ML IV BOLUS
INTRAVENOUS | Status: DC | PRN
  Administered 2016-11-21: 150 mg via INTRAVENOUS
  Administered 2016-11-21: 50 mg via INTRAVENOUS

## 2016-11-21 MED ORDER — BUPIVACAINE-EPINEPHRINE 0.5% -1:200000 IJ SOLN
INTRAMUSCULAR | Status: DC | PRN
  Administered 2016-11-21: 9 mL

## 2016-11-21 MED ORDER — HEPARIN SOD (PORK) LOCK FLUSH 100 UNIT/ML IV SOLN
500.0000 [IU] | INTRAVENOUS | Status: AC | PRN
  Administered 2016-11-21: 500 [IU]

## 2016-11-21 MED ORDER — DEXAMETHASONE SODIUM PHOSPHATE 10 MG/ML IJ SOLN
INTRAMUSCULAR | Status: AC
  Filled 2016-11-21: qty 1

## 2016-11-21 MED ORDER — SUGAMMADEX SODIUM 200 MG/2ML IV SOLN
INTRAVENOUS | Status: DC | PRN
  Administered 2016-11-21: 200 mg via INTRAVENOUS
  Administered 2016-11-21: 50 mg via INTRAVENOUS

## 2016-11-21 MED ORDER — PROMETHAZINE HCL 25 MG PO TABS: 12.5000 mg | ORAL_TABLET | ORAL | Status: DC | PRN

## 2016-11-21 MED ORDER — BACITRACIN ZINC 500 UNIT/GM EX OINT
TOPICAL_OINTMENT | CUTANEOUS | Status: AC
  Filled 2016-11-21: qty 28.35

## 2016-11-21 MED ORDER — LABETALOL HCL 5 MG/ML IV SOLN
10.0000 mg | INTRAVENOUS | Status: DC | PRN
  Administered 2016-11-22 (×2): 20 mg via INTRAVENOUS
  Filled 2016-11-21 (×2): qty 4

## 2016-11-21 SURGICAL SUPPLY — 74 items
BAG DECANTER FOR FLEXI CONT (MISCELLANEOUS) ×3 IMPLANT
BENZOIN TINCTURE PRP APPL 2/3 (GAUZE/BANDAGES/DRESSINGS) IMPLANT
BLADE 15 SAFETY STRL DISP (BLADE) ×3 IMPLANT
BLADE CLIPPER SPEC (BLADE) ×6 IMPLANT
BLADE ULTRA TIP 2M (BLADE) IMPLANT
BUR MATCHSTICK NEURO 3.0 LAGG (BURR) ×3 IMPLANT
BUR PRECISION FLUTE 6.0 (BURR) ×3 IMPLANT
CANISTER SUCT 3000ML PPV (MISCELLANEOUS) ×3 IMPLANT
CARTRIDGE OIL MAESTRO DRILL (MISCELLANEOUS) ×1 IMPLANT
CLIP VESOCCLUDE MED 6/CT (CLIP) IMPLANT
CONT SPEC 4OZ CLIKSEAL STRL BL (MISCELLANEOUS) ×3 IMPLANT
COVER MAYO STAND STRL (DRAPES) ×3 IMPLANT
DIFFUSER DRILL AIR PNEUMATIC (MISCELLANEOUS) ×3 IMPLANT
DRAPE HALF SHEET 40X57 (DRAPES) ×3 IMPLANT
DRAPE LAPAROTOMY 100X72 PEDS (DRAPES) ×3 IMPLANT
DRAPE MICROSCOPE LEICA (MISCELLANEOUS) ×3 IMPLANT
DRAPE SURG 17X23 STRL (DRAPES) ×6 IMPLANT
DRAPE WARM FLUID 44X44 (DRAPE) ×3 IMPLANT
ELECT BLADE 4.0 EZ CLEAN MEGAD (MISCELLANEOUS) ×3
ELECT REM PT RETURN 9FT ADLT (ELECTROSURGICAL) ×3
ELECTRODE BLDE 4.0 EZ CLN MEGD (MISCELLANEOUS) ×1 IMPLANT
ELECTRODE REM PT RTRN 9FT ADLT (ELECTROSURGICAL) ×1 IMPLANT
EVACUATOR 1/8 PVC DRAIN (DRAIN) IMPLANT
EVACUATOR SILICONE 100CC (DRAIN) IMPLANT
FORCEPS BIPOLAR SPETZLER 8 1.0 (NEUROSURGERY SUPPLIES) ×3 IMPLANT
GAUZE SPONGE 4X4 12PLY STRL (GAUZE/BANDAGES/DRESSINGS) ×3 IMPLANT
GAUZE SPONGE 4X4 16PLY XRAY LF (GAUZE/BANDAGES/DRESSINGS) IMPLANT
GLOVE BIO SURGEON STRL SZ8 (GLOVE) ×9 IMPLANT
GLOVE BIO SURGEON STRL SZ8.5 (GLOVE) ×3 IMPLANT
GLOVE BIOGEL PI IND STRL 7.0 (GLOVE) ×2 IMPLANT
GLOVE BIOGEL PI INDICATOR 7.0 (GLOVE) ×4
GLOVE ECLIPSE 6.5 STRL STRAW (GLOVE) ×3 IMPLANT
GLOVE EXAM NITRILE LRG STRL (GLOVE) IMPLANT
GLOVE EXAM NITRILE XL STR (GLOVE) IMPLANT
GLOVE EXAM NITRILE XS STR PU (GLOVE) IMPLANT
GLOVE INDICATOR 8.5 STRL (GLOVE) ×3 IMPLANT
GOWN STRL REUS W/ TWL LRG LVL3 (GOWN DISPOSABLE) ×1 IMPLANT
GOWN STRL REUS W/ TWL XL LVL3 (GOWN DISPOSABLE) ×2 IMPLANT
GOWN STRL REUS W/TWL LRG LVL3 (GOWN DISPOSABLE) ×2
GOWN STRL REUS W/TWL XL LVL3 (GOWN DISPOSABLE) ×4
GRAFT DURAGEN MATRIX 2WX2L ×3 IMPLANT
HEMOSTAT POWDER KIT SURGIFOAM (HEMOSTASIS) ×3 IMPLANT
KIT BASIN OR (CUSTOM PROCEDURE TRAY) ×3 IMPLANT
KIT ROOM TURNOVER OR (KITS) ×3 IMPLANT
MARKER SKIN DUAL TIP RULER LAB (MISCELLANEOUS) ×3 IMPLANT
NEEDLE HYPO 22GX1.5 SAFETY (NEEDLE) ×3 IMPLANT
NS IRRIG 1000ML POUR BTL (IV SOLUTION) ×3 IMPLANT
OIL CARTRIDGE MAESTRO DRILL (MISCELLANEOUS) ×3
PACK CRANIOTOMY (CUSTOM PROCEDURE TRAY) ×3 IMPLANT
PAD ARMBOARD 7.5X6 YLW CONV (MISCELLANEOUS) ×15 IMPLANT
PATTIES SURGICAL .5 X3 (DISPOSABLE) ×3 IMPLANT
PATTIES SURGICAL 1/4 X 3 (GAUZE/BANDAGES/DRESSINGS) IMPLANT
PIN MAYFIELD SKULL DISP (PIN) ×3 IMPLANT
RUBBERBAND STERILE (MISCELLANEOUS) ×6 IMPLANT
SEALANT ADHERUS EXTEND TIP (MISCELLANEOUS) IMPLANT
SET TUBING W/EXT DISP (INSTRUMENTS) ×3 IMPLANT
SPONGE LAP 4X18 X RAY DECT (DISPOSABLE) IMPLANT
SPONGE SURGIFOAM ABS GEL 100C (HEMOSTASIS) ×3 IMPLANT
STAPLER SKIN PROX WIDE 3.9 (STAPLE) ×3 IMPLANT
SUT ETHILON 2 0 FS 18 (SUTURE) IMPLANT
SUT ETHILON 2 0 PSLX (SUTURE) IMPLANT
SUT ETHILON 3 0 FSL (SUTURE) IMPLANT
SUT NURALON 4 0 TR CR/8 (SUTURE) ×6 IMPLANT
SUT PROLENE 6 0 BV (SUTURE) IMPLANT
SUT VIC AB 1 CT1 18XBRD ANBCTR (SUTURE) ×1 IMPLANT
SUT VIC AB 1 CT1 8-18 (SUTURE) ×2
SUT VIC AB 2-0 CP2 18 (SUTURE) ×3 IMPLANT
SUT VIC AB 3-0 SH 8-18 (SUTURE) ×3 IMPLANT
TIP STRAIGHT 25KHZ (INSTRUMENTS) ×3 IMPLANT
TOWEL GREEN STERILE (TOWEL DISPOSABLE) ×3 IMPLANT
TOWEL GREEN STERILE FF (TOWEL DISPOSABLE) IMPLANT
TRAY FOLEY W/METER SILVER 16FR (SET/KITS/TRAYS/PACK) ×3 IMPLANT
UNDERPAD 30X30 (UNDERPADS AND DIAPERS) IMPLANT
WATER STERILE IRR 1000ML POUR (IV SOLUTION) ×3 IMPLANT

## 2016-11-21 NOTE — Anesthesia Procedure Notes (Signed)
Procedure Name: Intubation Date/Time: 11/21/2016 12:20 PM Performed by: Jenne Campus Pre-anesthesia Checklist: Patient identified, Emergency Drugs available, Suction available and Patient being monitored Patient Re-evaluated:Patient Re-evaluated prior to induction Oxygen Delivery Method: Circle System Utilized Preoxygenation: Pre-oxygenation with 100% oxygen Induction Type: IV induction Ventilation: Mask ventilation without difficulty Laryngoscope Size: Miller and 2 Grade View: Grade I Tube type: Subglottic suction tube Tube size: 7.5 mm Number of attempts: 1 Airway Equipment and Method: Stylet and Oral airway Placement Confirmation: ETT inserted through vocal cords under direct vision,  positive ETCO2 and breath sounds checked- equal and bilateral Tube secured with: Tape Dental Injury: Teeth and Oropharynx as per pre-operative assessment

## 2016-11-21 NOTE — Progress Notes (Signed)
PROGRESS NOTE    Tasha Ortiz  KYH:[id number] DOB: 12/22/1955 DOA: 11/19/2016 PCP: [place], FNP (Confirm with patient/family/NH records and if not entered, this HAS to be entered at [facility] point of entry. "No PCP" if truly none.)   Brief Narrative: (Start on day 1 of progress note - keep it brief and live) 61 yo F with hx of breast cancer, HTN, GERD, HLD with 2 weeks of progressive vertigo, ataxia, and headaches.  She presented to the ED with MRI findings concerning for new brain mass.  To OR with neurosurgery today.     Assessment & Plan:   Active Problems:   Hyperlipidemia   Essential hypertension, benign   Allergic rhinitis   Acid reflux   Primary cancer of upper outer quadrant of right female breast (HCC)   Constipation   Vertigo   Brain mass   Fatty liver disease, nonalcoholic   Metastasis to brain of unknown origin ([place])   Worsening Vertigo/Ataxia/Headaches 2/2 to Brain Mass -To OR with neuro today -MRI Brain showed 29 mm mass, likely metastasis, within the inferior vermis with surrounding edema and local mass effect partially effacing the fourth ventricle. No hydrocephalus at this time, but the patient is at high risk for developing obstructive hydrocephalus as mass effect progresses. -Given IV Dexamethasone 10 mg 1x. C/w Dexamethasone IV 4 mg q8h; Started Protonix 40 mg po Daily for GI Prophylaxis -C/w Meclizine 12.5 mg po TIDprn Dizziness -Given 5 mg po Diazepam once  -Neurosurgery Consulted and planning on Surgery in AM or Thursday -CT Chest/Abd/Pelvis reveal no Metastatic Disease  Essential HTN -C/w Amlodipine 10 mg po Daily  HLD -Check FLP (LDL 119) -C/w Pravastatin 40 mg po Daily  GERD/GI Prophylaxis  -Started po Pantoprazole 40 mg po Daily for GI  Elevated WBC count  - likely due to dexamethasone  Fatty Liver Disease -Monitor LFT's; AST was 25 and ALT was 20 -Outpatient U/S with PCP  -Found on CT Scan  Seasonal  Allergies/Sinusitis/Allergic Rhinitis  -C/w Loratadine 10 mg po Daily  Hx of Breast Cancer s/p Right Mastectomy/Chemothearpy/XRT in 2014 -Currently in Remission -CT Chest showed Post right mastectomy and axillary lymph node dissection change and no evidence of metastatic disease -C/w Tamoxifen 20 mg po Daily  -C/w Mammograms as regularly scheduled -Continue to follow up with Outpatient Oncology Dr. Nicholas Lose  Constipation -Patient was found to have Moderate colonic stool burden consistent constipation. No bowel inflammation or obstruction. -Started Senna-Docusate 1 tab po BID   DVT prophylaxis: scd Code Status:full  Family Communication: none Disposition Plan: pending neurosurgery   Consultants:   neurosurgery  Procedures: (Don't include imaging studies which can be auto populated. Include things that cannot be auto populated i.e. Echo, Carotid and venous dopplers, Foley, Bipap, HD, tubes/drains, wound vac, central lines etc) Suboccipital craniectomy for gross total resection of cerebellar tumor using microdissection  Antimicrobials: (specify start and planned stop date. Auto populated tables are space occupying and do not give end dates)  Ancef for surgical ppx    Subjective: Sleepy, post op.  A&Ox3.    Objective: Vitals:   11/21/16 1745 11/21/16 1814 11/21/16 1830 11/21/16 1900  BP: (!) 149/85 (!) 142/63  (!) 155/84  Pulse: 90 89 89 95  Resp: 17 16 18 19   Temp:  98.1 F (36.7 C)    TempSrc:  Oral    SpO2: 100% 100% 100% 100%  Weight:      Height:        Intake/Output Summary (  Last 24 hours) at 11/21/16 2104 Last data filed at 11/21/16 1835  Gross per 24 hour  Intake          2359.33 ml  Output             1025 ml  Net          1334.33 ml   Filed Weights   11/19/16 1205 11/19/16 2230 11/21/16 1048  Weight: 78 kg (172 lb) 78 kg (171 lb 14.4 oz) 77.6 kg (171 lb)    Examination:  General exam: Appears calm and comfortable  Respiratory system: Clear  to auscultation. Respiratory effort normal. Cardiovascular system: S1 & S2 heard, RRR. No JVD, murmurs, rubs, gallops or clicks. No pedal edema. Gastrointestinal system: Abdomen is nondistended, soft and nontender. No organomegaly or masses felt. Normal bowel sounds heard. Central nervous system: Alert and oriented. No focal neurological deficits. Extremities: moving all extremities Skin: No rashes, lesions or ulcers - bandage over surgical site Psychiatry: sleepy    Data Reviewed: I have personally reviewed following labs and imaging studies  CBC:  Recent Labs Lab 11/19/16 1308 11/21/16 0539  WBC 6.0 18.5*  NEUTROABS 3.2 16.9*  HGB 13.8 13.2  HCT 40.1 39.3  MCV 86.8 87.9  PLT 179 751   Basic Metabolic Panel:  Recent Labs Lab 11/19/16 1308 11/21/16 0539  NA 142 141  K 3.9 4.4  CL 108 110  CO2 26 21*  GLUCOSE 111* 243*  BUN 12 17  CREATININE 0.66 0.91  CALCIUM 9.2 9.6  MG  --  2.3  PHOS  --  2.9   GFR: Estimated Creatinine Clearance: 67.1 mL/min (by C-G formula based on SCr of 0.91 mg/dL). Liver Function Tests:  Recent Labs Lab 11/19/16 1308 11/21/16 0539  AST 25 25  ALT 20 22  ALKPHOS 43 50  BILITOT 0.4 0.3  PROT 7.5 7.2  ALBUMIN 3.9 3.8   No results for input(s): LIPASE, AMYLASE in the last 168 hours. No results for input(s): AMMONIA in the last 168 hours. Coagulation Profile: No results for input(s): INR, PROTIME in the last 168 hours. Cardiac Enzymes: No results for input(s): CKTOTAL, CKMB, CKMBINDEX, TROPONINI in the last 168 hours. BNP (last 3 results) No results for input(s): PROBNP in the last 8760 hours. HbA1C: No results for input(s): HGBA1C in the last 72 hours. CBG: No results for input(s): GLUCAP in the last 168 hours. Lipid Profile:  Recent Labs  11/21/16 0539  CHOL 179  HDL 51  LDLCALC 119*  TRIG 44  CHOLHDL 3.5   Thyroid Function Tests:  Recent Labs  11/21/16 0539  TSH 2.031   Anemia Panel: No results for input(s):  VITAMINB12, FOLATE, FERRITIN, TIBC, IRON, RETICCTPCT in the last 72 hours. Sepsis Labs: No results for input(s): PROCALCITON, LATICACIDVEN in the last 168 hours.  Recent Results (from the past 240 hour(s))  Surgical pcr screen     Status: Abnormal   Collection Time: 11/21/16  1:35 AM  Result Value Ref Range Status   MRSA, PCR POSITIVE (A) NEGATIVE Final    Comment: RESULT CALLED TO, READ BACK BY AND VERIFIED WITH: Wilfred Lacy RN [phone] 11/21/16 [place],L    Staphylococcus aureus POSITIVE (A) NEGATIVE Final    Comment:        The Xpert SA Assay (FDA approved for NASAL specimens in patients over 68 years of age), is one component of a comprehensive surveillance program.  Test performance has been validated by [facility] for patients greater than or  equal to 33 year old. It is not intended to diagnose infection nor to guide or monitor treatment.          Radiology Studies: Ct Chest W Contrast  Result Date: 11/19/2016 CLINICAL DATA:  Sinus congestion assessed with headache and dizziness x2 weeks. Intermittent nausea without vomiting. EXAM: CT CHEST, ABDOMEN, AND PELVIS WITH CONTRAST TECHNIQUE: Multidetector CT imaging of the chest, abdomen and pelvis was performed following the standard protocol during bolus administration of intravenous contrast. CONTRAST:  18mL ISOVUE-300 IOPAMIDOL (ISOVUE-300) INJECTION 61% COMPARISON:  10/26/2013 and 03/2012 CT abdomen and pelvis FINDINGS: CT CHEST FINDINGS Cardiovascular: Normal size heart without pericardial effusion. No large central pulmonary embolus. Minimal aortic atherosclerosis at the arch without aneurysm or dissection. Unremarkable central pulmonary vasculature. No pulmonary embolus noted. Mediastinum/Nodes: No enlarged mediastinal, hilar, or axillary lymph nodes. Thyroid gland, trachea, and esophagus demonstrate no significant findings. Right axillary lymph node dissection clips are noted. Small fat containing left axillary lymph  nodes without pathologic enlargement. Lungs/Pleura: Minimal scarring at the right lung apex. Right middle lobe and both lower lobes subsegmental atelectasis and/or scarring. Small subpleural nodule in the right lower lobe measuring 5.2 mm in average, stable in appearance relative to 2014 consistent with a benign finding. Minimal pleural thickening along the right major fissure. No pneumonic consolidation or dominant mass. No effusion or pneumothorax. Musculoskeletal: Left anterior chest wall port catheter with tip in the mid SVC. Status post right mastectomy. CT ABDOMEN PELVIS FINDINGS Hepatobiliary: Hepatic steatosis without space-occupying mass. No biliary dilatation. Normal gallbladder. Pancreas: Normal Spleen: Normal Adrenals/Urinary Tract: Normal bilateral adrenal glands. Symmetric enhancement of both kidneys without focal mass. No obstructive uropathy. The bladder is unremarkable. Stomach/Bowel: Contracted appearing stomach. Normal small bowel rotation without obstruction or inflammation. And a moderate amount of fecal retention is seen within large bowel consistent with constipation. The appendix is normal. Vascular/Lymphatic: Aortic atherosclerosis. No enlarged abdominal or pelvic lymph nodes. Reproductive: Uterus and bilateral adnexa are unremarkable. Other: No abdominal wall hernia or abnormality. No abdominopelvic ascites. Musculoskeletal: No acute or significant osseous findings. IMPRESSION: 1. Post right mastectomy and axillary lymph node dissection change. 2. No evidence of metastatic disease. 3. Stable right lower lobe subpleural nodule dating back to 2014 consistent with benign finding. 4. Moderate colonic stool burden consistent constipation. No bowel inflammation or obstruction. 5. Mild hepatic steatosis. Electronically Signed   By: Ashley Royalty M.D.   On: 11/19/2016 21:52   Ct Abdomen Pelvis W Contrast  Result Date: 11/19/2016 CLINICAL DATA:  Sinus congestion assessed with headache and  dizziness x2 weeks. Intermittent nausea without vomiting. EXAM: CT CHEST, ABDOMEN, AND PELVIS WITH CONTRAST TECHNIQUE: Multidetector CT imaging of the chest, abdomen and pelvis was performed following the standard protocol during bolus administration of intravenous contrast. CONTRAST:  130mL ISOVUE-300 IOPAMIDOL (ISOVUE-300) INJECTION 61% COMPARISON:  10/26/2013 and 03/2012 CT abdomen and pelvis FINDINGS: CT CHEST FINDINGS Cardiovascular: Normal size heart without pericardial effusion. No large central pulmonary embolus. Minimal aortic atherosclerosis at the arch without aneurysm or dissection. Unremarkable central pulmonary vasculature. No pulmonary embolus noted. Mediastinum/Nodes: No enlarged mediastinal, hilar, or axillary lymph nodes. Thyroid gland, trachea, and esophagus demonstrate no significant findings. Right axillary lymph node dissection clips are noted. Small fat containing left axillary lymph nodes without pathologic enlargement. Lungs/Pleura: Minimal scarring at the right lung apex. Right middle lobe and both lower lobes subsegmental atelectasis and/or scarring. Small subpleural nodule in the right lower lobe measuring 5.2 mm in average, stable in appearance relative to 2014 consistent  with a benign finding. Minimal pleural thickening along the right major fissure. No pneumonic consolidation or dominant mass. No effusion or pneumothorax. Musculoskeletal: Left anterior chest wall port catheter with tip in the mid SVC. Status post right mastectomy. CT ABDOMEN PELVIS FINDINGS Hepatobiliary: Hepatic steatosis without space-occupying mass. No biliary dilatation. Normal gallbladder. Pancreas: Normal Spleen: Normal Adrenals/Urinary Tract: Normal bilateral adrenal glands. Symmetric enhancement of both kidneys without focal mass. No obstructive uropathy. The bladder is unremarkable. Stomach/Bowel: Contracted appearing stomach. Normal small bowel rotation without obstruction or inflammation. And a moderate  amount of fecal retention is seen within large bowel consistent with constipation. The appendix is normal. Vascular/Lymphatic: Aortic atherosclerosis. No enlarged abdominal or pelvic lymph nodes. Reproductive: Uterus and bilateral adnexa are unremarkable. Other: No abdominal wall hernia or abnormality. No abdominopelvic ascites. Musculoskeletal: No acute or significant osseous findings. IMPRESSION: 1. Post right mastectomy and axillary lymph node dissection change. 2. No evidence of metastatic disease. 3. Stable right lower lobe subpleural nodule dating back to 2014 consistent with benign finding. 4. Moderate colonic stool burden consistent constipation. No bowel inflammation or obstruction. 5. Mild hepatic steatosis. Electronically Signed   By: Ashley Royalty M.D.   On: 11/19/2016 21:52   Dg Chest Port 1 View  Result Date: 11/21/2016 CLINICAL DATA:  Central line placement EXAM: PORTABLE CHEST 1 VIEW COMPARISON:  03/28/2013 CXR and 11/19/2016 CT FINDINGS: The heart size and mediastinal contours are within normal limits. Aortic atherosclerosis at the arch without aneurysm. Right IJ central line catheter tip is seen at the cavoatrial junction. No pneumothorax. Port catheter tip is seen in the proximal to mid SVC. Both lungs are clear. The visualized skeletal structures are unremarkable. Right axillary lymph node dissection clips are noted. IMPRESSION: 1. Central venous line catheter tip at the cavoatrial junction without pneumothorax. 2. Clear lungs. 3. Aortic atherosclerosis. Electronically Signed   By: Ashley Royalty M.D.   On: 11/21/2016 17:56        Scheduled Meds: . amLODipine  10 mg Oral Daily  . Chlorhexidine Gluconate Cloth  6 each Topical Q0600  . dexamethasone  4 mg Intravenous Q8H  . dexamethasone  6 mg Oral Q6H   Followed by  . [START ON 11/22/2016] dexamethasone  4 mg Oral Q6H   Followed by  . [START ON 11/23/2016] dexamethasone  4 mg Oral Q8H  . docusate sodium  100 mg Oral BID  . fentaNYL       . loratadine  10 mg Oral Daily  . midazolam      . mupirocin ointment  1 application Nasal BID  . pantoprazole  40 mg Oral Daily  . pravastatin  40 mg Oral Daily  . promethazine      . senna-docusate  1 tablet Oral BID  . tamoxifen  20 mg Oral Daily   Continuous Infusions: . sodium chloride 20 mL/hr at 11/20/16 1750  . sodium chloride    . 0.9 % NaCl with KCl 20 mEq / L    . ceFAZolin    . [START ON 11/22/2016]  ceFAZolin (ANCEF) IV       LOS: 2 days    Time spent: 35 min    A Melven Sartorius, MD [facility] Pager [phone]  If 7PM-7AM, please contact night-coverage [email or web address] Password [facility] 11/21/2016, 9:04 PM

## 2016-11-21 NOTE — Progress Notes (Signed)
Subjective:  The patient is alert and pleasant.  Objective: Vital signs in last 24 hours: Temp:  [98 F (36.7 C)-98.5 F (36.9 C)] 98.1 F (36.7 C) (08/22 0730) Pulse Rate:  [80-90] 88 (08/22 0730) Resp:  [17-20] 17 (08/22 0730) BP: (108-141)/(62-88) 141/88 (08/22 0730) SpO2:  [97 %-100 %] 100 % (08/22 0730) Weight:  [77.6 kg (171 lb)] 77.6 kg (171 lb) (08/22 1048)  Intake/Output from previous day: 08/21 0701 - 08/22 0700 In: 604.3 [I.V.:604.3] Out: -  Intake/Output this shift: No intake/output data recorded.  Physical exam the patient is alert and oriented. She is moving all 4 extremities.  Lab Results:  Recent Labs  11/19/16 1308 11/21/16 0539  WBC 6.0 18.5*  HGB 13.8 13.2  HCT 40.1 39.3  PLT 179 229   BMET  Recent Labs  11/19/16 1308 11/21/16 0539  NA 142 141  K 3.9 4.4  CL 108 110  CO2 26 21*  GLUCOSE 111* 243*  BUN 12 17  CREATININE 0.66 0.91  CALCIUM 9.2 9.6    Studies/Results: Ct Chest W Contrast  Result Date: 11/19/2016 CLINICAL DATA:  Sinus congestion assessed with headache and dizziness x2 weeks. Intermittent nausea without vomiting. EXAM: CT CHEST, ABDOMEN, AND PELVIS WITH CONTRAST TECHNIQUE: Multidetector CT imaging of the chest, abdomen and pelvis was performed following the standard protocol during bolus administration of intravenous contrast. CONTRAST:  11mL ISOVUE-300 IOPAMIDOL (ISOVUE-300) INJECTION 61% COMPARISON:  10/26/2013 and 03/2012 CT abdomen and pelvis FINDINGS: CT CHEST FINDINGS Cardiovascular: Normal size heart without pericardial effusion. No large central pulmonary embolus. Minimal aortic atherosclerosis at the arch without aneurysm or dissection. Unremarkable central pulmonary vasculature. No pulmonary embolus noted. Mediastinum/Nodes: No enlarged mediastinal, hilar, or axillary lymph nodes. Thyroid gland, trachea, and esophagus demonstrate no significant findings. Right axillary lymph node dissection clips are noted. Small fat  containing left axillary lymph nodes without pathologic enlargement. Lungs/Pleura: Minimal scarring at the right lung apex. Right middle lobe and both lower lobes subsegmental atelectasis and/or scarring. Small subpleural nodule in the right lower lobe measuring 5.2 mm in average, stable in appearance relative to 2014 consistent with a benign finding. Minimal pleural thickening along the right major fissure. No pneumonic consolidation or dominant mass. No effusion or pneumothorax. Musculoskeletal: Left anterior chest wall port catheter with tip in the mid SVC. Status post right mastectomy. CT ABDOMEN PELVIS FINDINGS Hepatobiliary: Hepatic steatosis without space-occupying mass. No biliary dilatation. Normal gallbladder. Pancreas: Normal Spleen: Normal Adrenals/Urinary Tract: Normal bilateral adrenal glands. Symmetric enhancement of both kidneys without focal mass. No obstructive uropathy. The bladder is unremarkable. Stomach/Bowel: Contracted appearing stomach. Normal small bowel rotation without obstruction or inflammation. And a moderate amount of fecal retention is seen within large bowel consistent with constipation. The appendix is normal. Vascular/Lymphatic: Aortic atherosclerosis. No enlarged abdominal or pelvic lymph nodes. Reproductive: Uterus and bilateral adnexa are unremarkable. Other: No abdominal wall hernia or abnormality. No abdominopelvic ascites. Musculoskeletal: No acute or significant osseous findings. IMPRESSION: 1. Post right mastectomy and axillary lymph node dissection change. 2. No evidence of metastatic disease. 3. Stable right lower lobe subpleural nodule dating back to 2014 consistent with benign finding. 4. Moderate colonic stool burden consistent constipation. No bowel inflammation or obstruction. 5. Mild hepatic steatosis. Electronically Signed   By: Ashley Royalty M.D.   On: 11/19/2016 21:52   Mr Jeri Cos NO Contrast  Result Date: 11/19/2016 CLINICAL DATA:  61 y/o F; headache and  dizziness for 2 weeks. Sinus congestion. History of right breast cancer  post chemo and radiation. EXAM: MRI HEAD WITHOUT AND WITH CONTRAST TECHNIQUE: Multiplanar, multiecho pulse sequences of the brain and surrounding structures were obtained without and with intravenous contrast. CONTRAST:  15 cc MultiHance. COMPARISON:  03/27/2013 CT head FINDINGS: Brain: Enhancing mass centered within the inferior vermis with extension into right greater than left cerebellar hemispheres measuring 29 x 28 x 27 mm (AP x ML x CC series 14, image 14 and series 15, image 8). The there is surrounding T2 FLAIR hyperintense signal abnormality which extends throughout the vermis and right greater than left cerebellar hemispheres as well as into the right brachium pontis compatible with edema. Local mass effect partially effaces the fourth ventricle and right quadrigeminal plate cistern. No evidence for acute/early subacute infarct of the brain, acute intracranial hemorrhage, or additional enhancing lesion. No hydrocephalus at this time. Background of mild chronic microvascular ischemic changes and parenchymal volume loss of the brain. Vascular: Normal flow voids. Skull and upper cervical spine: Normal marrow signal. Sinuses/Orbits: The mild ethmoid sinus mucosal thickening. No abnormal signal of mastoid air cells. Normal orbits. Other: None. IMPRESSION: 29 mm mass, likely metastasis, within the inferior vermis with surrounding edema and local mass effect partially effacing the fourth ventricle. No hydrocephalus at this time, but the patient is at high risk for developing obstructive hydrocephalus as mass effect progresses. These results were called by telephone at the time of interpretation on 11/19/2016 at 7:36 pm to Dr. Billy Fischer, who verbally acknowledged these results. Electronically Signed   By: Kristine Garbe M.D.   On: 11/19/2016 19:43   Ct Abdomen Pelvis W Contrast  Result Date: 11/19/2016 CLINICAL DATA:  Sinus  congestion assessed with headache and dizziness x2 weeks. Intermittent nausea without vomiting. EXAM: CT CHEST, ABDOMEN, AND PELVIS WITH CONTRAST TECHNIQUE: Multidetector CT imaging of the chest, abdomen and pelvis was performed following the standard protocol during bolus administration of intravenous contrast. CONTRAST:  148mL ISOVUE-300 IOPAMIDOL (ISOVUE-300) INJECTION 61% COMPARISON:  10/26/2013 and 03/2012 CT abdomen and pelvis FINDINGS: CT CHEST FINDINGS Cardiovascular: Normal size heart without pericardial effusion. No large central pulmonary embolus. Minimal aortic atherosclerosis at the arch without aneurysm or dissection. Unremarkable central pulmonary vasculature. No pulmonary embolus noted. Mediastinum/Nodes: No enlarged mediastinal, hilar, or axillary lymph nodes. Thyroid gland, trachea, and esophagus demonstrate no significant findings. Right axillary lymph node dissection clips are noted. Small fat containing left axillary lymph nodes without pathologic enlargement. Lungs/Pleura: Minimal scarring at the right lung apex. Right middle lobe and both lower lobes subsegmental atelectasis and/or scarring. Small subpleural nodule in the right lower lobe measuring 5.2 mm in average, stable in appearance relative to 2014 consistent with a benign finding. Minimal pleural thickening along the right major fissure. No pneumonic consolidation or dominant mass. No effusion or pneumothorax. Musculoskeletal: Left anterior chest wall port catheter with tip in the mid SVC. Status post right mastectomy. CT ABDOMEN PELVIS FINDINGS Hepatobiliary: Hepatic steatosis without space-occupying mass. No biliary dilatation. Normal gallbladder. Pancreas: Normal Spleen: Normal Adrenals/Urinary Tract: Normal bilateral adrenal glands. Symmetric enhancement of both kidneys without focal mass. No obstructive uropathy. The bladder is unremarkable. Stomach/Bowel: Contracted appearing stomach. Normal small bowel rotation without  obstruction or inflammation. And a moderate amount of fecal retention is seen within large bowel consistent with constipation. The appendix is normal. Vascular/Lymphatic: Aortic atherosclerosis. No enlarged abdominal or pelvic lymph nodes. Reproductive: Uterus and bilateral adnexa are unremarkable. Other: No abdominal wall hernia or abnormality. No abdominopelvic ascites. Musculoskeletal: No acute or significant osseous findings. IMPRESSION: 1. Post  right mastectomy and axillary lymph node dissection change. 2. No evidence of metastatic disease. 3. Stable right lower lobe subpleural nodule dating back to 2014 consistent with benign finding. 4. Moderate colonic stool burden consistent constipation. No bowel inflammation or obstruction. 5. Mild hepatic steatosis. Electronically Signed   By: Ashley Royalty M.D.   On: 11/19/2016 21:52    Assessment/Plan: Cerebellar tumor: I have answered all the patient's questions regarding surgery. She wants to proceed.  LOS: 2 days     Mabell Esguerra D 11/21/2016, 11:39 AM

## 2016-11-21 NOTE — Anesthesia Procedure Notes (Signed)
Arterial Line Insertion Start/End8/22/2018 11:25 AM, 11/21/2016 11:40 AM Performed by: Carney Living, CRNA  Preanesthetic checklist: patient identified, IV checked, risks and benefits discussed, monitors and equipment checked and timeout performed Lidocaine 1% used for infiltration Left, radial was placed Catheter size: 20 G  Attempts: 1 Procedure performed without using ultrasound guided technique. Following insertion, dressing applied and Biopatch. Post procedure assessment: normal and unchanged  Patient tolerated the procedure well with no immediate complications.

## 2016-11-21 NOTE — Progress Notes (Signed)
eLink Physician-Brief Progress Note Patient Name: Tasha Ortiz DOB: 1955/07/13 MRN: [id number]   Date of Service  11/21/2016  HPI/Events of Note  61 year old black female who has a history of breast cancer. She presented to the ER with headaches and ataxia. A brain MRI was obtained which demonstrated a cerebellar lesion.   Preoperative diagnosis: Cerebellar metastasis  Postop diagnosis: Same; limited diagnosis is metastatic carcinoma  Procedure: Suboccipital craniectomy for gross total resection of cerebellar tumor using microdissection  Post op-no resp distress, off vent  eICU Interventions  No new orders     Intervention Category Evaluation Type: New Patient Evaluation  Yajaira Doffing 11/21/2016, 6:19 PM

## 2016-11-21 NOTE — Transfer of Care (Signed)
Immediate Anesthesia Transfer of Care Note  Patient: Tasha Ortiz  Procedure(s) Performed: Procedure(s): SUBOCCIPITAL CRANIECTOMY RESECTION TUMOR (N/A)  Patient Location: PACU  Anesthesia Type:General  Level of Consciousness: awake, oriented and patient cooperative  Airway & Oxygen Therapy: Patient Spontanous Breathing and Patient connected to face mask oxygen  Post-op Assessment: Report given to RN and Post -op Vital signs reviewed and stable  Post vital signs: Reviewed  Last Vitals:  Vitals:   11/21/16 0613 11/21/16 0730  BP: 133/80 (!) 141/88  Pulse: 80 88  Resp: 18 17  Temp: 36.7 C 36.7 C  SpO2: 98% 100%    Last Pain:  Vitals:   11/21/16 0730  TempSrc: Oral  PainSc:          Complications: No apparent anesthesia complications

## 2016-11-21 NOTE — Anesthesia Preprocedure Evaluation (Addendum)
Anesthesia Evaluation  Patient identified by MRN, date of birth, ID band Patient awake    Reviewed: Allergy & Precautions, NPO status   Airway Mallampati: II  TM Distance: >3 FB     Dental  (+) Missing, Dental Advisory Given, Poor Dentition   Pulmonary neg pulmonary ROS,    breath sounds clear to auscultation       Cardiovascular hypertension, Pt. on medications  Rhythm:Regular Rate:Normal     Neuro/Psych History noted. CG negative psych ROS   GI/Hepatic Neg liver ROS, GERD  Medicated and Controlled,  Endo/Other  negative endocrine ROS  Renal/GU negative Renal ROS     Musculoskeletal   Abdominal   Peds  Hematology   Anesthesia Other Findings   Reproductive/Obstetrics                            Anesthesia Physical Anesthesia Plan  ASA: III  Anesthesia Plan: General   Post-op Pain Management:    Induction: Intravenous  PONV Risk Score and Plan:   Airway Management Planned: Oral ETT  Additional Equipment: Arterial line and CVP  Intra-op Plan:   Post-operative Plan: Possible Post-op intubation/ventilation  Informed Consent: I have reviewed the patients History and Physical, chart, labs and discussed the procedure including the risks, benefits and alternatives for the proposed anesthesia with the patient or authorized representative who has indicated his/her understanding and acceptance.   Dental advisory given  Plan Discussed with: CRNA and Anesthesiologist  Anesthesia Plan Comments:         Anesthesia Quick Evaluation

## 2016-11-21 NOTE — Anesthesia Procedure Notes (Signed)
Central Venous Catheter Insertion Performed by: Belinda Block Start/End8/22/2018 11:30 AM, 11/21/2016 11:45 AM Preanesthetic checklist: patient identified, IV checked, site marked, risks and benefits discussed, surgical consent, monitors and equipment checked, pre-op evaluation and timeout performed Position: Trendelenburg Hand hygiene performed , maximum sterile barriers used  and Seldinger technique used Central line was placed.Double lumen Procedure performed using ultrasound guided technique. Ultrasound Notes:anatomy identified Attempts: 1 Following insertion, line sutured. Post procedure assessment: blood return through all ports  Patient tolerated the procedure well with no immediate complications.

## 2016-11-21 NOTE — Progress Notes (Signed)
Patient ID: [name], female   DOB: [birth year], 61 y.o.   MRN: [id number] Subjective:  The patient is alert and pleasant. She is in no apparent distress.  Objective: Vital signs in last 24 hours: Temp:  [97.3 F (36.3 C)-98.2 F (36.8 C)] 98.2 F (36.8 C) (08/22 2000) Pulse Rate:  [80-102] 96 (08/22 2200) Resp:  [13-22] 22 (08/22 2200) BP: (124-155)/(63-88) 146/87 (08/22 2200) SpO2:  [97 %-100 %] 97 % (08/22 2200) Arterial Line BP: (124-178)/(61-85) 150/69 (08/22 2200) Weight:  [77.6 kg (171 lb)] 77.6 kg (171 lb) (08/22 1048)  Intake/Output from previous day: 08/21 0701 - 08/22 0700 In: 604.3 [I.V.:604.3] Out: -  Intake/Output this shift: Total I/O In: 30 [I.V.:30] Out: 850 [Urine:850]  Physical exam the patient is alert and oriented. She is moving all 4 extremities well. Her pupils are equal. She has a bit of nystagmus and left lateral gaze preference.  Lab Results:  Recent Labs  11/19/16 1308 11/21/16 0539  WBC 6.0 18.5*  HGB 13.8 13.2  HCT 40.1 39.3  PLT 179 229   BMET  Recent Labs  11/19/16 1308 11/21/16 0539  NA 142 141  K 3.9 4.4  CL 108 110  CO2 26 21*  GLUCOSE 111* 243*  BUN 12 17  CREATININE 0.66 0.91  CALCIUM 9.2 9.6    Studies/Results: Dg Chest Port 1 View  Result Date: 11/21/2016 CLINICAL DATA:  Central line placement EXAM: PORTABLE CHEST 1 VIEW COMPARISON:  03/28/2013 CXR and 11/19/2016 CT FINDINGS: The heart size and mediastinal contours are within normal limits. Aortic atherosclerosis at the arch without aneurysm. Right IJ central line catheter tip is seen at the cavoatrial junction. No pneumothorax. Port catheter tip is seen in the proximal to mid SVC. Both lungs are clear. The visualized skeletal structures are unremarkable. Right axillary lymph node dissection clips are noted. IMPRESSION: 1. Central venous line catheter tip at the cavoatrial junction without pneumothorax. 2. Clear lungs. 3. Aortic atherosclerosis. Electronically  Signed   By: Ashley Royalty M.D.   On: 11/21/2016 17:56    Assessment/Plan: The patient is doing well. We will continue with ICU observation. We will plan to repeat her MRI in the next day or 2.  LOS: 2 days     Tasha Ortiz D 11/21/2016, 10:50 PM

## 2016-11-21 NOTE — Op Note (Signed)
Brief history: The patient is a 61 year old black female who has a history of breast cancer. She presented to the ER with headaches and ataxia. A brain MRI was obtained which demonstrated a cerebellar lesion. CT of the chest abdomen and pelvis was negative for other metastatic disease. I discussed the situation with the patient and her family. We discussed the various treatment options including a suboccipital craniectomy for resection of the lesion. I described the surgery to them. We discussed the risks, benefits, alternatives, and likelihood of achieving her goals with surgery. I have answered all the patient's questions. She has decided to proceed with surgery.  Preoperative diagnosis: Cerebellar metastasis  Postop diagnosis: Same; limited diagnosis is metastatic carcinoma  Procedure: Suboccipital craniectomy for gross total resection of cerebellar tumor using microdissection  Surgeon: Dr. [name]  Assistant: Dr. Kary Kos  Anesthesia: Gen. orotracheal  Estimated blood loss: 150 mL  Specimens: Tumor  Drains: None  Complications: None  Description of procedure: The patient was brought to the operating room by the anesthesia team. General endotracheal anesthesia was induced. I applied the [place] 3 point headrest and the patient's calvarium. The patient was then turned to the prone position on the chest rolls. The patient suboccipital region was then shaved with clippers in this region as well as the posterior neck and upper thorax was prepared with Betadine scrub and Betadine solution. Sterile drapes were applied. I then injected the area to be incised with Marcaine with epinephrine solution. I used a scalpel to make a midline incision in the suboccipital and upper cervical region. I used a cerebellar retractor for exposure. I used electrocautery to incise the cervical fascia and expose it suboccipital bone. I then used a high-speed drill and Kerrison punches to perform a suboccipital  craniectomy. I then incised the underlying dura in a Y-shaped fashion and tacked the dural edges up with 4-0 Nurolon suture. The underlying cerebellum was swollen. I then inserted the Applebaum retractor. We then brought the operative microscope into the field and under its medication and illumination completed the microdissection. I used bipolar electrocautery and suction to dissected between the cerebellar hemispheres. I then performed a small corticotomy in the cerebellar vermis. I dissected a bit deeper and we quickly encountered the tumor. The tumor was quite friable brought it and vascular. We sent off biopsy specimens to the pathologist and it came back consistent with metastatic carcinoma. I then used the Sonapet to internally debulk the tumor to gain some room. Carefully dissected around the periphery of the tumor between the cerebellum and the tumor coagulating the superficial tumor vessels with bipolar cautery. I dissected caudal to the tumor and expose the cisterna magna which released spinal fluid given [place] much more room to work with. I continued to internally debulk the tumor with the ultrasonic aspirator. Using suction and bipolar electric cautery we were able to circumscribe the tumor and coagulated the last feeding vessels. We then removed the remainder of the shelled out tumor with the biopsy forceps. We could see the floor of the fourth ventricle. The tumor appeared to invade the right cerebellar peduncle a bit. We obtained a gross total resection, i.e. I could not see any more tumor. We obtained an stasis using bipolar cautery, Gelfoam, and Surgicel. I irrigated the tumor resection cavity out copiously with saline and bacitracin solution until the irrigant came back crystal clear. I lined the tumor resection cavity with Surgicel. I then reapproximated the patient's dura with interrupted 4-0 Nurolon suture. It was  not a watertight closure. I laid Duragen over the exposed dura. We then removed the  retractor and reapproximated patient's cervical thoracic fascia with interrupted #1 Vicryl suture. I reapproximated the subcutaneous tissue with interrupted 2-0 Vicryl suture. I reapproximated the skin with a running 2-0 nylon suture. The wound was then coated with bacitracin ointment. A sterile dressing was applied. The drapes were removed. The patient was then returned to the supine position. I then removed the Mayfield 3 point head rest from the patient's calvarium. I reported all sponge, instrument, and it'll counts were correct at the end of this case.

## 2016-11-21 NOTE — Anesthesia Postprocedure Evaluation (Signed)
Anesthesia Post Note  Patient: Tasha Ortiz  Procedure(s) Performed: Procedure(s) (LRB): SUBOCCIPITAL CRANIECTOMY RESECTION TUMOR (N/A)     Patient location during evaluation: PACU Anesthesia Type: General Level of consciousness: awake Pain management: pain level controlled Vital Signs Assessment: post-procedure vital signs reviewed and stable Respiratory status: spontaneous breathing Cardiovascular status: stable Anesthetic complications: no    Last Vitals:  Vitals:   11/21/16 1700 11/21/16 1715  BP: (!) 141/70   Pulse: 93 88  Resp: (!) 22 16  Temp:    SpO2: 100% 100%    Last Pain:  Vitals:   11/21/16 1715  TempSrc:   PainSc: Asleep    LLE Motor Response: Purposeful movement;Responds to commands (11/21/16 1715) LLE Sensation: Full sensation (11/21/16 1715) RLE Motor Response: Purposeful movement;Responds to commands (11/21/16 1715) RLE Sensation: Full sensation (11/21/16 1715)      [name]

## 2016-11-22 ENCOUNTER — Encounter (HOSPITAL_COMMUNITY): Payer: Self-pay | Admitting: Neurosurgery

## 2016-11-22 ENCOUNTER — Inpatient Hospital Stay (HOSPITAL_COMMUNITY): Payer: BLUE CROSS/BLUE SHIELD

## 2016-11-22 DIAGNOSIS — R270 Ataxia, unspecified: Secondary | ICD-10-CM

## 2016-11-22 DIAGNOSIS — R269 Unspecified abnormalities of gait and mobility: Secondary | ICD-10-CM

## 2016-11-22 DIAGNOSIS — I10 Essential (primary) hypertension: Secondary | ICD-10-CM

## 2016-11-22 DIAGNOSIS — G939 Disorder of brain, unspecified: Secondary | ICD-10-CM

## 2016-11-22 LAB — GLUCOSE, CAPILLARY
Glucose-Capillary: 135 mg/dL — ABNORMAL HIGH (ref 65–99)
Glucose-Capillary: 157 mg/dL — ABNORMAL HIGH (ref 65–99)
Glucose-Capillary: 174 mg/dL — ABNORMAL HIGH (ref 65–99)
Glucose-Capillary: 252 mg/dL — ABNORMAL HIGH (ref 65–99)
Glucose-Capillary: 368 mg/dL — ABNORMAL HIGH (ref 65–99)

## 2016-11-22 LAB — HEMOGLOBIN A1C
Hgb A1c MFr Bld: 6.3 % — ABNORMAL HIGH (ref 4.8–5.6)
Mean Plasma Glucose: 134.11 mg/dL

## 2016-11-22 MED ORDER — DEXAMETHASONE 4 MG PO TABS
4.0000 mg | ORAL_TABLET | Freq: Three times a day (TID) | ORAL | Status: DC
  Administered 2016-11-23 – 2016-11-24 (×3): 4 mg via ORAL
  Filled 2016-11-22 (×3): qty 1

## 2016-11-22 MED ORDER — GADOBENATE DIMEGLUMINE 529 MG/ML IV SOLN
15.0000 mL | Freq: Once | INTRAVENOUS | Status: AC | PRN
  Administered 2016-11-22: 15 mL via INTRAVENOUS

## 2016-11-22 MED ORDER — INSULIN ASPART 100 UNIT/ML ~~LOC~~ SOLN
0.0000 [IU] | SUBCUTANEOUS | Status: DC
  Administered 2016-11-22: 3 [IU] via SUBCUTANEOUS
  Administered 2016-11-22 (×2): 4 [IU] via SUBCUTANEOUS
  Administered 2016-11-22: 20 [IU] via SUBCUTANEOUS
  Administered 2016-11-23: 3 [IU] via SUBCUTANEOUS
  Administered 2016-11-23 (×4): 4 [IU] via SUBCUTANEOUS
  Administered 2016-11-24 (×3): 3 [IU] via SUBCUTANEOUS
  Administered 2016-11-24: 4 [IU] via SUBCUTANEOUS

## 2016-11-22 MED ORDER — DEXAMETHASONE 4 MG PO TABS
4.0000 mg | ORAL_TABLET | Freq: Four times a day (QID) | ORAL | Status: AC
  Administered 2016-11-22 – 2016-11-23 (×4): 4 mg via ORAL
  Filled 2016-11-22 (×4): qty 1

## 2016-11-22 MED ORDER — DEXAMETHASONE 4 MG PO TABS: 4.0000 mg | ORAL_TABLET | Freq: Three times a day (TID) | ORAL | Status: DC

## 2016-11-22 MED ORDER — DEXAMETHASONE 4 MG PO TABS: 4.0000 mg | ORAL_TABLET | Freq: Four times a day (QID) | ORAL | Status: DC

## 2016-11-22 NOTE — Progress Notes (Signed)
PROGRESS NOTE    Tasha Ortiz  GGE:[id number] DOB: [birth year] DOA: 11/19/2016 PCP: [facility], FNP   Brief Narrative:  61 yo F with hx of breast cancer, HTN, GERD, HLD with 2 weeks of progressive vertigo, ataxia, and headaches.  She presented to the ED with MRI findings concerning for new brain mass.  Now s/p resection of cerebellar tumor.  Awaiting postoperative MRI.  Pathology pending.    Assessment & Plan:   Active Problems:   Hyperlipidemia   Essential hypertension, benign   Allergic rhinitis   Acid reflux   Primary cancer of upper outer quadrant of right female breast (HCC)   Constipation   Vertigo   Brain mass   Fatty liver disease, nonalcoholic   Metastasis to brain of unknown origin ([place])   Worsening Vertigo/Ataxia/Headaches 2/2 to Brain Mass  - s/p OR with neurosurgery yesterday [ ]  Postop MRI pending [ ]  pathology pending -MRI Brain showed "29 mm mass, likely metastasis, within the inferior vermis with surrounding edema and local mass effect partially effacing the fourth ventricle. No hydrocephalus at this time, but the patient is at high risk for developing obstructive hydrocephalus as mass effect progresses." -Given IV Dexamethasone 10 mg 1x. C/w Dexamethasone IV 4 mg q8h; Started Protonix 40 mg po Daily for GI Prophylaxis -C/w Meclizine 12.5 mg po TIDprn Dizziness -Given 5 mg po Diazepam once  -CT Chest/Abd/Pelvis reveal no Metastatic Disease  Essential HTN -C/w Amlodipine 10 mg po Daily  Prediabetes: BG likely worsened given DM - will need education for diet/risk of developing diabetes - she's on SSI  HLD -Check FLP (LDL 119) -C/w Pravastatin 40 mg po Daily  GERD/GI Prophylaxis  -Started po Pantoprazole 40 mg po Daily for GI  Elevated WBC count  - likely due to dexamethasone  Fatty Liver Disease -Monitor LFT's; AST was 25 and ALT was 20 -Outpatient U/S with PCP  -Found on CT Scan  Seasonal Allergies/Sinusitis/Allergic Rhinitis    -C/w Loratadine 10 mg po Daily  Hx of Breast Cancer s/p Right Mastectomy/Chemothearpy/XRT in 2014 -Currently in Remission -CT Chest showed Post right mastectomy and axillary lymph node dissection change and no evidence of metastatic disease -C/w Tamoxifen 20 mg po Daily  -C/w Mammograms as regularly scheduled -Continue to follow up with Outpatient Oncology Dr. Nicholas Lose  Constipation -Patient was found to have Moderate colonic stool burden consistent constipation. No bowel inflammation or obstruction. -Started Senna-Docusate 1 tab po BID   DVT prophylaxis: scd Code Status:full  Family Communication: none Disposition Plan: pending neurosurgery   Consultants:   neurosurgery  Procedures: (Don't include imaging studies which can be auto populated. Include things that cannot be auto populated i.e. Echo, Carotid and venous dopplers, Foley, Bipap, HD, tubes/drains, wound vac, central lines etc) Suboccipital craniectomy for gross total resection of cerebellar tumor using microdissection  Antimicrobials: (specify start and planned stop date. Auto populated tables are space occupying and do not give end dates)  Ancef for surgical ppx    Subjective: Some post operative pain and nausea, but otherwise without complaints.  No f/c.  A&Ox3.    Objective: Vitals:   11/22/16 1100 11/22/16 1200 11/22/16 1300 11/22/16 1400  BP: 120/86 (!) 147/78 140/70 138/83  Pulse: 81 80 82 81  Resp: 16 18 16 20   Temp:      TempSrc:      SpO2: 97% 97% 97% 99%  Weight:      Height:        Intake/Output Summary (Last  24 hours) at 11/22/16 1523 Last data filed at 11/22/16 1300  Gross per 24 hour  Intake          2076.25 ml  Output             3900 ml  Net         -1823.75 ml   Filed Weights   11/19/16 1205 11/19/16 2230 11/21/16 1048  Weight: 78 kg (172 lb) 78 kg (171 lb 14.4 oz) 77.6 kg (171 lb)    Examination:  General exam: NAD  Respiratory system: CTAB, unlabored Cardiovascular  system: normal s1, s2, no mgr Gastrointestinal system: s/nt/nd Central nervous system: Alert and oriented. No focal neurological deficits. Extremities: moving all estremities Skin: dressing over surgical site Psychiatry: sleepy    Data Reviewed: I have personally reviewed following labs and imaging studies  CBC:  Recent Labs Lab 11/19/16 1308 11/21/16 0539  WBC 6.0 18.5*  NEUTROABS 3.2 16.9*  HGB 13.8 13.2  HCT 40.1 39.3  MCV 86.8 87.9  PLT 179 409   Basic Metabolic Panel:  Recent Labs Lab 11/19/16 1308 11/21/16 0539  NA 142 141  K 3.9 4.4  CL 108 110  CO2 26 21*  GLUCOSE 111* 243*  BUN 12 17  CREATININE 0.66 0.91  CALCIUM 9.2 9.6  MG  --  2.3  PHOS  --  2.9   GFR: Estimated Creatinine Clearance: 67.1 mL/min (by C-G formula based on SCr of 0.91 mg/dL). Liver Function Tests:  Recent Labs Lab 11/19/16 1308 11/21/16 0539  AST 25 25  ALT 20 22  ALKPHOS 43 50  BILITOT 0.4 0.3  PROT 7.5 7.2  ALBUMIN 3.9 3.8   No results for input(s): LIPASE, AMYLASE in the last 168 hours. No results for input(s): AMMONIA in the last 168 hours. Coagulation Profile: No results for input(s): INR, PROTIME in the last 168 hours. Cardiac Enzymes: No results for input(s): CKTOTAL, CKMB, CKMBINDEX, TROPONINI in the last 168 hours. BNP (last 3 results) No results for input(s): PROBNP in the last 8760 hours. HbA1C:  Recent Labs  11/22/16 1242  HGBA1C 6.3*   CBG:  Recent Labs Lab 11/22/16 0820 11/22/16 1141  GLUCAP 252* 368*   Lipid Profile:  Recent Labs  11/21/16 0539  CHOL 179  HDL 51  LDLCALC 119*  TRIG 44  CHOLHDL 3.5   Thyroid Function Tests:  Recent Labs  11/21/16 0539  TSH 2.031   Anemia Panel: No results for input(s): VITAMINB12, FOLATE, FERRITIN, TIBC, IRON, RETICCTPCT in the last 72 hours. Sepsis Labs: No results for input(s): PROCALCITON, LATICACIDVEN in the last 168 hours.  Recent Results (from the past 240 hour(s))  Surgical pcr screen      Status: Abnormal   Collection Time: 11/21/16  1:35 AM  Result Value Ref Range Status   MRSA, PCR POSITIVE (A) NEGATIVE Final    Comment: RESULT CALLED TO, READ BACK BY AND VERIFIED WITH: Wilfred Lacy RN [phone] 11/21/16 Laughter,L    Staphylococcus aureus POSITIVE (A) NEGATIVE Final    Comment:        The Xpert SA Assay (FDA approved for NASAL specimens in patients over 59 years of age), is one component of a comprehensive surveillance program.  Test performance has been validated by [facility] for patients greater than or equal to 66 year old. It is not intended to diagnose infection nor to guide or monitor treatment.          Radiology Studies: Dg Chest [facility]  Result Date: 11/21/2016 CLINICAL DATA:  Central line placement EXAM: PORTABLE CHEST 1 VIEW COMPARISON:  03/28/2013 CXR and 11/19/2016 CT FINDINGS: The heart size and mediastinal contours are within normal limits. Aortic atherosclerosis at the arch without aneurysm. Right IJ central line catheter tip is seen at the cavoatrial junction. No pneumothorax. Port catheter tip is seen in the proximal to mid SVC. Both lungs are clear. The visualized skeletal structures are unremarkable. Right axillary lymph node dissection clips are noted. IMPRESSION: 1. Central venous line catheter tip at the cavoatrial junction without pneumothorax. 2. Clear lungs. 3. Aortic atherosclerosis. Electronically Signed   By: Ashley Royalty M.D.   On: 11/21/2016 17:56        Scheduled Meds: . amLODipine  10 mg Oral Daily  . Chlorhexidine Gluconate Cloth  6 each Topical Q0600  . dexamethasone  4 mg Oral Q6H   Followed by  . [START ON 11/23/2016] dexamethasone  4 mg Oral Q8H  . docusate sodium  100 mg Oral BID  . insulin aspart  0-20 Units Subcutaneous Q4H  . loratadine  10 mg Oral Daily  . mupirocin ointment  1 application Nasal BID  . pantoprazole  40 mg Oral Daily  . pravastatin  40 mg Oral Daily  . senna-docusate  1 tablet Oral BID   . tamoxifen  20 mg Oral Daily   Continuous Infusions: . sodium chloride 20 mL/hr at 11/20/16 1750  . sodium chloride    . 0.9 % NaCl with KCl 20 mEq / L 75 mL/hr at 11/22/16 1249     LOS: 3 days    Time spent: 35 min    A Melven Sartorius, MD [facility] Pager [phone]  If 7PM-7AM, please contact night-coverage [email or web address] Password [facility] 11/22/2016, 3:23 PM

## 2016-11-22 NOTE — Progress Notes (Signed)
Occupational Therapy Re-Evaluation Patient Details Name: Tasha Ortiz MRN: [id number] DOB: 1955-10-30 Today's Date: 11/22/2016    History of Present Illness Pt is a 61 y/o female who was admitted for worsening headache and decreased balance. MRI revealed cerebellar mass. Pt underwent tumor suboccipital craniotomy for tumor resection 8/22. PMH includes breast cancer s/p mastectomy, HTN, and L portacath placement.    Clinical Impression   Pt demonstrates significant decline in functional status as compared to pre-surgery. Pt with R bias in sitting and standing, trunkal ataxia, nystagmus causing blurry vision and impaired balance. 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 Pt motivated to return to PLOF. Supportive family/friends. Will reassess goals next session.     Follow Up Recommendations  CIR;Supervision/Assistance - 24 hour    Equipment Recommendations  3 in 1 bedside commode;Other (comment) (will further assess)    Recommendations for Other Services Rehab consult     Precautions / Restrictions Precautions Precautions: Fall      Mobility Bed Mobility Overal bed mobility: Needs Assistance Bed Mobility: Supine to Sit     Supine to sit: Min assist     General bed mobility comments: A due to being unsteady and significant R bias  Transfers Overall transfer level: Needs assistance   Transfers: Sit to/from Stand Sit to Stand: Mod assist         General transfer comment: poorly controlled sit - stand    Balance     Sitting balance-Leahy Scale: Fair     Standing balance support: No upper extremity supported (leaning R) Standing balance-Leahy Scale: Poor (Required BUE support)                             ADL either performed or assessed with clinical judgement   ADL       Grooming: Min guard;Sitting (pt required A at times  for midline orientation due to R bias)   Upper Body Bathing: Minimal assistance (limited by pain)   Lower Body Bathing: Minimal assistance;Sit to/from stand   Upper Body Dressing : Minimal assistance   Lower Body Dressing: Minimal assistance;Sit to/from stand   Toilet Transfer: Minimal assistance;Stand-pivot   Toileting- Clothing Manipulation and Hygiene: Minimal assistance               Vision   Vision Assessment?: Vision impaired- to be further tested in functional context;Yes Alignment/Gaze Preference: Gaze left (L gaze preference) Tracking/Visual Pursuits: Decreased smoothness of horizontal tracking;Decreased smoothness of vertical tracking Additional Comments:  horizontal nystagmus noted     Perception     Praxis      Pertinent Vitals/Pain Pain Assessment: Faces Faces Pain Scale: Hurts whole lot Pain Location: headache Pain Descriptors / Indicators: Aching;Grimacing;Discomfort Pain Intervention(s): Limited activity within patient's tolerance;Premedicated before session     Hand Dominance     Extremity/Trunk Assessment  Geneal weakness. Will further assess.           Communication     Cognition Arousal/Alertness: Awake/alert Behavior During Therapy: WFL for tasks assessed/performed Overall Cognitive Status: No family/caregiver present to determine baseline cognitive functioning                                 General Comments: Decreaesd awareness of deficits   General Comments  Exercises     Shoulder Instructions      Home Living                                          Prior Functioning/Environment                   OT Problem List:        OT Treatment/Interventions:      OT Goals(Current goals can be found in the care plan section) Acute Rehab OT Goals - will reassess next session Patient Stated Goal: to get better  OT Goal Formulation: With patient Time For Goal Achievement:  12/04/16 Potential to Achieve Goals: Good ADL Goals Pt Will Perform Upper Body Dressing: with modified independence;sitting Pt Will Perform Lower Body Dressing: with modified independence;sit to/from stand Pt Will Transfer to Toilet: with modified independence;ambulating;regular height toilet Pt Will Perform Toileting - Clothing Manipulation and hygiene: with modified independence;sit to/from stand Pt/caregiver will Perform Home Exercise Program: Both right and left upper extremity;Left upper extremity;With written HEP provided;Independently  OT Frequency: Min 2X/week   Barriers to D/C:            Co-evaluation              AM-PAC PT "6 Clicks" Daily Activity     Outcome Measure Help from another person eating meals?: A Little Help from another person taking care of personal grooming?: A Little Help from another person toileting, which includes using toliet, bedpan, or urinal?: A Lot Help from another person bathing (including washing, rinsing, drying)?: A Lot Help from another person to put on and taking off regular upper body clothing?: A Little Help from another person to put on and taking off regular lower body clothing?: A Lot 6 Click Score: 15   End of Session Equipment Utilized During Treatment: Gait belt Nurse Communication: Mobility status  Activity Tolerance: Patient tolerated treatment well Patient left: in bed;with call bell/phone within reach;with bed alarm set  OT Visit Diagnosis: Unsteadiness on feet (R26.81);Muscle weakness (generalized) (M62.81);Ataxia, unspecified (R27.0);Low vision, both eyes (H54.2);Dizziness and giddiness (R42)                Time: 9373-4287 OT Time Calculation (min): 22 min Charges:  OT General Charges $OT Visit: 1 Procedure OT Evaluation $OT Re-eval: 1 Procedure G-Codes:     Angelina Neece, OT/L  [phone] 11/22/2016  Jowell Bossi,HILLARY 11/22/2016, 2:14 PM

## 2016-11-22 NOTE — Consult Note (Signed)
Physical Medicine and Rehabilitation Consult Reason for Consult: Decreased functional mobility Referring Physician: Dr. [name]   HPI: Tasha Ortiz is a 61 y.o. right handed female with history of hypertension, right breast cancer treated with mastectomy subsequent chemotherapy,  tobacco abuse.  Patient lives with daughter. Independent prior to admission and daughter works during the day. Presented 11/19/2016 with progressive vertigo ataxia occasional headaches. MRI obtained showed cerebellar lesion. CT chest abdomen pelvis negative for any metastatic disease. Underwent suboccipital craniectomy for gross total resection of cerebellar tumor 11/21/2016 per Dr. [name]. Postoperative MRI of the brain pending. Decadron protocol as indicated. Postoperative occupational therapy evaluation completed 11/22/2016 with recommendations of physical medicine rehabilitation consult.   Patient is dizzy when she looks around, but does not have dizziness, laying in bed, no nausea or vomiting but does have poor appetite.   Review of Systems  Constitutional: Negative for chills and fever.  HENT: Negative for hearing loss.   Eyes: Negative for blurred vision and double vision.  Respiratory: Negative for cough and shortness of breath.   Cardiovascular: Negative for chest pain, palpitations and leg swelling.  Gastrointestinal: Positive for constipation. Negative for nausea and vomiting.       GERD  Genitourinary: Negative for dysuria, flank pain and hematuria.  Musculoskeletal: Positive for myalgias.  Skin: Negative for rash.  Neurological: Positive for dizziness, weakness and headaches. Negative for seizures.  All other systems reviewed and are negative.  Past Medical History:  Diagnosis Date  . Acid reflux   . Allergy    seasonal  . Breast cancer ([place])    right,no iv or blood on right sid, chemo and radiation 2014  . Heartburn   . Hypercholesteremia    taken off chol meds Dec 2012  .  Hypertension   . Hypertension   . Personal history of chemotherapy 05/2012   rt breast  . Personal history of radiation therapy 10/2012   rt breast   Past Surgical History:  Procedure Laterality Date  . BREAST BIOPSY Right 04/16/2012  . BREAST SURGERY Right 05/20/12   Rt br mastectomy  . CESAREAN SECTION  25 years ago  . colonoscopy    . COLONOSCOPY WITH PROPOFOL N/A 01/18/2015   Procedure: COLONOSCOPY WITH PROPOFOL;  Surgeon: Mauri Pole, MD;  Location: [facility];  Service: Endoscopy;  Laterality: N/A;  . MASTECTOMY Right 05/2012  . MODIFIED MASTECTOMY Right 05/20/2012   Procedure: MODIFIED MASTECTOMY;  Surgeon: Edward Jolly, MD;  Location: [facility];  Service: General;  Laterality: Right;  . PORTACATH PLACEMENT Left 05/20/2012   Procedure: INSERTION PORT-A-CATH;  Surgeon: Edward Jolly, MD;  Location: [place];  Service: General;  Laterality: Left;  . SUBOCCIPITAL CRANIECTOMY CERVICAL LAMINECTOMY N/A 11/21/2016   Procedure: SUBOCCIPITAL CRANIECTOMY RESECTION TUMOR;  Surgeon: Newman Pies, MD;  Location: [place];  Service: Neurosurgery;  Laterality: N/A;  . TUBAL LIGATION     Family History  Problem Relation Age of Onset  . Hypertension Mother   . Cancer Sister        brain  . Cancer Cousin        breast  . Healthy Father   . Healthy Maternal Grandmother   . Healthy Maternal Grandfather   . Healthy Paternal Grandmother   . Healthy Paternal Grandfather   . Diabetes Daughter   . Colon cancer Neg Hx    Social History:  reports that she has never smoked. She quit smokeless tobacco use about 20 years ago. Her  smokeless tobacco use included Chew. She reports that she does not drink alcohol or use drugs. Allergies:  Allergies  Allergen Reactions  . Lisinopril Swelling   Medications Prior to Admission  Medication Sig Dispense Refill  . acetaminophen (TYLENOL) 325 MG tablet Take 650 mg by mouth every 6 (six) hours as needed for moderate pain.    Marland Kitchen amLODipine  (NORVASC) 10 MG tablet Take 1 tablet (10 mg total) by mouth daily. 90 tablet 3  . cetirizine (ZYRTEC) 10 MG tablet Take 1 tablet (10 mg total) by mouth daily. (Patient taking differently: Take 10 mg by mouth daily as needed. ) 30 tablet 11  . cyclobenzaprine (FLEXERIL) 5 MG tablet Take 1 tablet (5 mg total) by mouth 3 (three) times daily as needed for muscle spasms. 30 tablet 1  . fluticasone (FLONASE) 50 MCG/ACT nasal spray Place 2 sprays into both nostrils daily. 16 g 3  . meclizine (ANTIVERT) 12.5 MG tablet Take 1 tablet (12.5 mg total) by mouth 3 (three) times daily as needed for dizziness. 30 tablet 1  . naproxen sodium (ANAPROX) 220 MG tablet Take 220 mg by mouth 2 (two) times daily as needed (pain).    . tamoxifen (NOLVADEX) 20 MG tablet Take 1 tablet (20 mg total) by mouth daily. 90 tablet 3  . famotidine (PEPCID) 20 MG tablet Take 1 tablet (20 mg total) by mouth 2 (two) times daily as needed for heartburn or indigestion. (Patient not taking: Reported on 11/19/2016) 180 tablet 3  . oxymetazoline (AFRIN NASAL SPRAY) 0.05 % nasal spray Place 1 spray into both nostrils 2 (two) times daily. Use only for 3days, then stop (Patient taking differently: Place 1 spray into both nostrils 2 (two) times daily as needed for congestion. Use only for 3days, then stop) 30 mL 0  . pravastatin (PRAVACHOL) 40 MG tablet Take 1 tablet (40 mg total) by mouth daily. Annual appt is due w/labs must see provider for future refills 30 tablet 0    Home: Home Living Family/patient expects to be discharged to:: Private residence Living Arrangements: Children Available Help at Discharge: Family, Available PRN/intermittently Type of Home: Apartment Home Access: Stairs to enter Technical brewer of Steps: 13 Entrance Stairs-Rails: Right Home Layout: One level Bathroom Shower/Tub: Chiropodist: Standard Home Equipment: Grab bars - tub/shower  Functional History: Prior Function Level of  Independence: Independent Comments: ADls, IADLs, working Functional Status:  Mobility: Bed Mobility Overal bed mobility: Needs Assistance Bed Mobility: Supine to Sit Supine to sit: Min assist General bed mobility comments: A due to being unsteady and significant R bias Transfers Overall transfer level: Needs assistance Equipment used: None Transfers: Sit to/from Stand Sit to Stand: Mod assist General transfer comment: poorly controlled sit - stand Ambulation/Gait Ambulation/Gait assistance: Min guard, Supervision Ambulation Distance (Feet): 250 Feet Assistive device: None Gait Pattern/deviations: Step-through pattern, Decreased stride length, Drifts right/left General Gait Details: Slow, overall steady gait. Able to speed up on command, however, prefers to walk at slower space. Performed dynamic balance tasks during ambulation (see DGI below). Slight imbalance noted with horizontal/vertical head turns, and required min guard for steadying assist. Will reasses balance following surgery  Gait velocity: Decreased Gait velocity interpretation: Below normal speed for age/gender    ADL: ADL Overall ADL's : Needs assistance/impaired Eating/Feeding: Set up, Sitting Grooming: Min guard, Sitting (pt required A at times for midline orientation due to R bias) Upper Body Bathing: Minimal assistance (limited by pain) Lower Body Bathing: Minimal assistance, Sit to/from stand  Upper Body Dressing : Minimal assistance Lower Body Dressing: Minimal assistance, Sit to/from stand Lower Body Dressing Details (indicate cue type and reason): Able to reach down and adjust socks Toilet Transfer: Minimal assistance, Stand-pivot Toileting- Clothing Manipulation and Hygiene: Minimal assistance Functional mobility during ADLs: Supervision/safety General ADL Comments: Supervision overall despite coordination deficits.   Cognition: Cognition Overall Cognitive Status: No family/caregiver present to determine  baseline cognitive functioning Orientation Level: Oriented to person, Oriented to place, Oriented to situation, Disoriented to time Cognition Arousal/Alertness: Awake/alert Behavior During Therapy: WFL for tasks assessed/performed Overall Cognitive Status: No family/caregiver present to determine baseline cognitive functioning General Comments: Decreaesd awareness of deficits  Blood pressure 140/70, pulse 82, temperature 98.2 F (36.8 C), temperature source Oral, resp. rate 16, height 5\' 4"  (1.626 m), weight 77.6 kg (171 lb), last menstrual period 08/16/2012, SpO2 97 %. Physical Exam  Constitutional: She appears well-developed.  Eyes:  Pupils round and reactive to light however she chooses to keep her right eye closed  Neck: Normal range of motion. Neck supple. No thyromegaly present.  Cardiovascular: Normal rate, regular rhythm and normal heart sounds.   Respiratory: Effort normal and breath sounds normal. No respiratory distress.  GI: Soft. Bowel sounds are normal. She exhibits no distension.  Neurological: She is alert.  Oriented to person place and date of birth. Follows simple commands. Mood is flat but appropriate. She makes good eye contact with examiner.  Skin:  Craniotomy site clean and dry  Moderate dysmetria, right finger-nose-finger, mild dysmetria, right heel-to-shin. No evidence of dysmetria, left finger-nose to finger or left shoulder shin. Motor strength is 5/5 bilateral deltoid, biceps, triceps, grip, hip flexor, knee extensor, ankle dorsal flexor. Sensation intact to light touch bilateral upper and lower limb as well as facial region. Visual fields are intact, she does have evidence of nystagmus, mainly toward the right greater than total left horizontal.  Results for orders placed or performed during the hospital encounter of 11/19/16 (from the past 24 hour(s))  Prepare RBC     Status: None   Collection Time: 11/21/16  4:30 PM  Result Value Ref Range   Order  Confirmation ORDER PROCESSED BY BLOOD BANK   Glucose, capillary     Status: Abnormal   Collection Time: 11/22/16  8:20 AM  Result Value Ref Range   Glucose-Capillary 252 (H) 65 - 99 mg/dL   Comment 1 Notify RN   Glucose, capillary     Status: Abnormal   Collection Time: 11/22/16 11:41 AM  Result Value Ref Range   Glucose-Capillary 368 (H) 65 - 99 mg/dL   Comment 1 Notify RN   Hemoglobin A1c     Status: Abnormal   Collection Time: 11/22/16 12:42 PM  Result Value Ref Range   Hgb A1c MFr Bld 6.3 (H) 4.8 - 5.6 %   Mean Plasma Glucose 134.11 mg/dL   Dg Chest Port 1 View  Result Date: 11/21/2016 CLINICAL DATA:  Central line placement EXAM: PORTABLE CHEST 1 VIEW COMPARISON:  03/28/2013 CXR and 11/19/2016 CT FINDINGS: The heart size and mediastinal contours are within normal limits. Aortic atherosclerosis at the arch without aneurysm. Right IJ central line catheter tip is seen at the cavoatrial junction. No pneumothorax. Port catheter tip is seen in the proximal to mid SVC. Both lungs are clear. The visualized skeletal structures are unremarkable. Right axillary lymph node dissection clips are noted. IMPRESSION: 1. Central venous line catheter tip at the cavoatrial junction without pneumothorax. 2. Clear lungs. 3. Aortic atherosclerosis. Electronically Signed  By: Ashley Royalty M.D.   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 Does the need for close, 24 hr/day medical supervision in concert with the patient's rehab needs make it unreasonable for this patient to be served in a less intensive setting? Yes 2. Co-Morbidities requiring supervision/potential complications: Diabetes mellitus, poorly controlled, postoperative pain 3. Due to bladder management, bowel management, safety, skin/wound care, disease  management, medication administration, pain management and patient education, does the patient require 24 hr/day rehab nursing? Yes 4. Does the patient require coordinated care of a physician, rehab nurse, PT (1-2 hrs/day, 5 days/week) and OT (1-2 hrs/day, 5 days/week) to address physical and functional deficits in the context of the above medical diagnosis(es)? Yes Addressing deficits in the following areas: balance, endurance, locomotion, strength, transferring, bowel/bladder control, bathing, dressing, feeding, grooming, toileting and psychosocial support 5. Can the patient actively participate in an intensive therapy program of at least 3 hrs of therapy per day at least 5 days per week? Yes 6. The potential for patient to make measurable gains while on inpatient rehab is excellent 7. Anticipated functional outcomes upon discharge from inpatient rehab are modified independent and supervision  with PT, modified independent and supervision with OT, n/a with SLP. 8. Estimated rehab length of stay to reach the above functional goals is: 7-10d 9. Anticipated D/C setting: Home 10. Anticipated post D/C treatments: [facility] therapy 11. Overall Rehab/Functional Prognosis: excellent  RECOMMENDATIONS: This patient's condition is appropriate for continued rehabilitative care in the following setting: CIR Patient has agreed to participate in recommended program. Yes Note that insurance prior authorization may be required for reimbursement for recommended care.  Comment:   Charlett Blake M.D. [facility] FAAPM&R (Sports Med, Neuromuscular Med) Diplomate Am Board of Electrodiagnostic Med  Cathlyn Parsons., PA-C 11/22/2016

## 2016-11-22 NOTE — Progress Notes (Signed)
Physical Therapy Treatment/Re-evaluation Patient Details Name: Tasha Ortiz MRN: [id number] DOB: [birth year] Today's Date: 11/22/2016    History of Present Illness Pt is a 61 y/o female who was admitted for worsening headache and decreased balance. MRI revealed cerebellar mass. Pt underwent tumor resection 8/22. PMH includes breast cancer s/p mastectomy, HTN, and L portacath placement.     PT Comments    Pt now s/p tumor resection and presents with increased balance deficits and an overall decline in functional status compared to PT evaluation. Noted R beating nystagmus throughout session with a significant R lateral lean in sitting and standing. Pt reports dizziness throughout session and was attempting to control the dizziness by keeping L eye closed during gait training. Discussed recommendation for increased rehab at d/c and pt/family agreeable. Initiated CIR pre-admission screen this date. Will continue to follow and progress as able per POC.   Follow Up Recommendations  CIR;Supervision/Assistance - 24 hour     Equipment Recommendations  Rolling walker with 5" wheels    Recommendations for Other Services       Precautions / Restrictions Precautions Precautions: Fall Restrictions Weight Bearing Restrictions: No    Mobility  Bed Mobility Overal bed mobility: Needs Assistance Bed Mobility: Supine to Sit     Supine to sit: Min assist     General bed mobility comments: A due to being unsteady and significant R bias  Transfers Overall transfer level: Needs assistance Equipment used: None Transfers: Sit to/from Stand Sit to Stand: Mod assist         General transfer comment: poorly controlled sit - stand with significant anterior lean.   Ambulation/Gait Ambulation/Gait assistance: Min assist;+2 physical assistance;+2 safety/equipment Ambulation Distance (Feet): 7 Feet Assistive device: Rolling walker (2 wheeled) Gait Pattern/deviations: Step-through  pattern;Decreased stride length;Drifts right/left Gait velocity: Decreased Gait velocity interpretation: Below normal speed for age/gender General Gait Details: Slow and unsteady gait. Noted significant R lateral lean with standing mobility and with 3 LOB. Pt keeping L eye closed throughout gait training 2 reported dizziness. States there is no double vision but she feels better with one eye closed.    Stairs            Wheelchair Mobility    Modified Rankin (Stroke Patients Only)       Balance Overall balance assessment: Needs assistance Sitting-balance support: No upper extremity supported;Feet supported Sitting balance-Leahy Scale: Fair     Standing balance support: No upper extremity supported (leaning R) Standing balance-Leahy Scale: Poor (Required BUE support)                              Cognition Arousal/Alertness: Awake/alert Behavior During Therapy: WFL for tasks assessed/performed Overall Cognitive Status: Impaired/Different from baseline Area of Impairment: Attention;Memory;Following commands;Safety/judgement;Problem solving                   Current Attention Level: Selective Memory: Decreased short-term memory;Decreased recall of precautions Following Commands: Follows one step commands consistently;Follows one step commands with increased time Safety/Judgement: Decreased awareness of safety;Decreased awareness of deficits   Problem Solving: Slow processing;Decreased initiation;Difficulty sequencing;Requires verbal cues General Comments: Decreaesd awareness of deficits      Exercises      General Comments        Pertinent Vitals/Pain Pain Assessment: Faces Faces Pain Scale: Hurts whole lot Pain Location: headache Pain Descriptors / Indicators: Aching;Grimacing;Discomfort Pain Intervention(s): Limited activity within patient's tolerance;Monitored during session;Repositioned    Home  Living                       Prior Function            PT Goals (current goals can now be found in the care plan section) Acute Rehab PT Goals Patient Stated Goal: to get better  PT Goal Formulation: With patient Time For Goal Achievement: 11/27/16 Potential to Achieve Goals: Good Progress towards PT goals: Progressing toward goals    Frequency    Min 3X/week      PT Plan Current plan remains appropriate    Co-evaluation              AM-PAC PT "6 Clicks" Daily Activity  Outcome Measure  Difficulty turning over in bed (including adjusting bedclothes, sheets and blankets)?: None Difficulty moving from lying on back to sitting on the side of the bed? : None Difficulty sitting down on and standing up from a chair with arms (e.g., wheelchair, bedside commode, etc,.)?: Unable Help needed moving to and from a bed to chair (including a wheelchair)?: A Little Help needed walking in hospital room?: A Little Help needed climbing 3-5 steps with a railing? : A Little 6 Click Score: 18    End of Session Equipment Utilized During Treatment: Gait belt Activity Tolerance: Patient tolerated treatment well Patient left: in chair;with call bell/phone within reach Nurse Communication: Mobility status PT Visit Diagnosis: Unsteadiness on feet (R26.81)     Time: 8638-1771 PT Time Calculation (min) (ACUTE ONLY): 25 min  Charges:  $Gait Training: 8-22 mins                    G Codes:       Rolinda Roan, PT, DPT Acute Rehabilitation Services Pager: [phone] 11/22/2016, 3:21 PM

## 2016-11-22 NOTE — Progress Notes (Signed)
Rehab Admissions Coordinator Note:  Patient was screened by Cleatrice Burke for appropriateness for an Inpatient Acute Rehab Consult per OT recommendation.  At this time, we are recommending Inpatient Rehab consult.  Cleatrice Burke 11/22/2016, 2:32 PM  I can be reached at [phone].

## 2016-11-22 NOTE — Progress Notes (Signed)
Patient ID: Tasha Ortiz, female   DOB: February 18, 1956, 61 y.o.   MRN: [id number] Subjective:  The patient is alert and pleasant. She is in no apparent distress. Her husband is at the bedside.  Objective: Vital signs in last 24 hours: Temp:  [97.3 F (36.3 C)-98.2 F (36.8 C)] 98.2 F (36.8 C) (08/22 2000) Pulse Rate:  [84-102] 86 (08/23 0800) Resp:  [13-23] 21 (08/23 0800) BP: (133-155)/(63-87) 139/79 (08/23 0800) SpO2:  [95 %-100 %] 96 % (08/23 0800) Arterial Line BP: (124-179)/(61-85) 163/79 (08/23 0800) Weight:  [77.6 kg (171 lb)] 77.6 kg (171 lb) (08/22 1048)  Intake/Output from previous day: 08/22 0701 - 08/23 0700 In: 2851.3 [I.V.:2501.3; IV Piggyback:350] Out: 6606 [Urine:3225; Blood:350] Intake/Output this shift: Total I/O In: 150 [I.V.:150] Out: -   Physical exam the patient is alert and oriented. Her pupils are equal. She still has nystagmus but has improved. She has a left lateral gaze preference. She is moving all 4 extremities well. Her dressing is clean and dry.  Lab Results:  Recent Labs  11/19/16 1308 11/21/16 0539  WBC 6.0 18.5*  HGB 13.8 13.2  HCT 40.1 39.3  PLT 179 229   BMET  Recent Labs  11/19/16 1308 11/21/16 0539  NA 142 141  K 3.9 4.4  CL 108 110  CO2 26 21*  GLUCOSE 111* 243*  BUN 12 17  CREATININE 0.66 0.91  CALCIUM 9.2 9.6    Studies/Results: Dg Chest Port 1 View  Result Date: 11/21/2016 CLINICAL DATA:  Central line placement EXAM: PORTABLE CHEST 1 VIEW COMPARISON:  03/28/2013 CXR and 11/19/2016 CT FINDINGS: The heart size and mediastinal contours are within normal limits. Aortic atherosclerosis at the arch without aneurysm. Right IJ central line catheter tip is seen at the cavoatrial junction. No pneumothorax. Port catheter tip is seen in the proximal to mid SVC. Both lungs are clear. The visualized skeletal structures are unremarkable. Right axillary lymph node dissection clips are noted. IMPRESSION: 1. Central venous line  catheter tip at the cavoatrial junction without pneumothorax. 2. Clear lungs. 3. Aortic atherosclerosis. Electronically Signed   By: Ashley Royalty M.D.   On: 11/21/2016 17:56    Assessment/Plan: Postop day #1: The patient is doing well. We are awaiting a postoperative MRI to confirm gross total resection of the tumor. We will mobilize her. She may be able to leave the unit later today. I have answered all their questions.  LOS: 3 days     Zakhari Fogel D 11/22/2016, 8:27 AM

## 2016-11-23 DIAGNOSIS — I619 Nontraumatic intracerebral hemorrhage, unspecified: Secondary | ICD-10-CM

## 2016-11-23 LAB — BPAM RBC
Blood Product Expiration Date: 201809172359
Blood Product Expiration Date: 201809182359
Blood Product Expiration Date: 201809182359
Blood Product Expiration Date: 201809202359
Blood Product Expiration Date: 201809202359
Blood Product Expiration Date: 201809212359
Blood Product Expiration Date: 201809212359
Blood Product Expiration Date: 201809212359
ISSUE DATE / TIME: 201808221152
ISSUE DATE / TIME: 201808221152
ISSUE DATE / TIME: 201808221152
ISSUE DATE / TIME: 201808221152
Unit Type and Rh: 7300
Unit Type and Rh: 7300
Unit Type and Rh: 7300
Unit Type and Rh: 7300
Unit Type and Rh: 7300
Unit Type and Rh: 7300
Unit Type and Rh: 7300
Unit Type and Rh: 7300

## 2016-11-23 LAB — TYPE AND SCREEN
ABO/RH(D): B POS
Antibody Screen: NEGATIVE
Unit division: 0
Unit division: 0
Unit division: 0
Unit division: 0
Unit division: 0
Unit division: 0
Unit division: 0
Unit division: 0

## 2016-11-23 LAB — GLUCOSE, CAPILLARY
Glucose-Capillary: 133 mg/dL — ABNORMAL HIGH (ref 65–99)
Glucose-Capillary: 144 mg/dL — ABNORMAL HIGH (ref 65–99)
Glucose-Capillary: 152 mg/dL — ABNORMAL HIGH (ref 65–99)
Glucose-Capillary: 153 mg/dL — ABNORMAL HIGH (ref 65–99)
Glucose-Capillary: 170 mg/dL — ABNORMAL HIGH (ref 65–99)
Glucose-Capillary: 183 mg/dL — ABNORMAL HIGH (ref 65–99)

## 2016-11-23 NOTE — PMR Pre-admission (Signed)
PMR Admission Coordinator Pre-Admission Assessment  Patient: Tasha Ortiz is an 61 y.o., female MRN: [id number] DOB: Nov 07, 1955 Height: 5\' 4"  (162.6 cm) Weight: 77.6 kg (171 lb)              Insurance Information HMO:     PPO: yes     PCP:      IPA:      80/20:      OTHER:  PRIMARY: [employer] [place] group      Policy#: [id number]      Subscriber: pt CM Name: Tasha Ortiz      Phone#: [phone]     Fax#: [phone] Pre-Cert#: [id number] auth for 11/24/16 until 12/07/16 when updates are due     Employer: [employer] Benefits:  Phone #: [phone]     Name: 11/23/16 Eff. Date: 04/02/16     Deduct: $1000      Out of Pocket Max: $5500      Life Max: none CIR: 70%      SNF: 70% 120 days Outpatient: $65 co pay per visit     Co-Pay: visits per medical neccesity Home Health: 70%      Co-Pay: 100 visits combined DME: 70%     Co-Pay: 30% Providers: in network  SECONDARY: none        Medicaid Application Date:       Case Manager:  Disability Application Date:       Case Worker:   Emergency Contact Information Contact Information    Name Relation Home Work Tasha Ortiz [phone]     Tasha Ortiz, Tasha Ortiz Ortiz [phone]       Current Medical History  Patient Admitting Diagnosis: status post suboccipital craniectomy and resection for right cerebellar mass  History of Present Illness:     : HPI: Tasha Poppe Mitchellis a 61 y.o.right handed femalewith history of hypertension, right breast cancer treated with mastectomy subsequent chemotherapy And maintained on tamoxifen, tobacco abuse. Presented 11/19/2016 with progressive vertigo ataxia occasional headaches. MRI obtained showed cerebellar lesion. CT chest abdomen pelvis negative for any metastatic disease. Underwent suboccipital craniectomy for gross total resection of cerebellar tumor 11/21/2016 per Dr. [name]. Postoperative MRI of the brain Showed postoperative changes after resection minimal residual  tumor as well as moderate sized acute infarct involving the inferior and medial cerebellar hemispheres. Decadron protocol as indicated. MRSA PCR screen positive maintained on contact precautions.  Total: 2 NIHSS  Past Medical History  Past Medical History:  Diagnosis Date  . Acid reflux   . Allergy    seasonal  . Breast cancer ([place])    right,no iv or blood on right sid, chemo and radiation 2014  . Heartburn   . Hypercholesteremia    taken off chol meds Dec 2012  . Hypertension   . Hypertension   . Personal history of chemotherapy 05/2012   rt breast  . Personal history of radiation therapy 10/2012   rt breast    Family History  family history includes Cancer in her cousin and sister; Diabetes in her Ortiz; Healthy in her father, maternal grandfather, maternal grandmother, paternal grandfather, and paternal grandmother; Hypertension in her mother.  Prior Rehab/Hospitalizations:  Has the patient had major surgery during 100 days prior to admission? No  Current Medications   Current Facility-Administered Medications:  .  0.9 %  sodium chloride infusion, , Intravenous, Continuous, Lacretia Leigh, MD, Last Rate: 20 mL/hr at 11/20/16 1750 .  0.9 %  sodium chloride infusion, , Intravenous, Once, Belinda Block, MD .  0.9 % NaCl with KCl 20 mEq/ L  infusion, , Intravenous, Continuous, Newman Pies, MD, Last Rate: 75 mL/hr at 11/23/16 0409 .  acetaminophen (TYLENOL) tablet 650 mg, 650 mg, Oral, Q4H PRN **OR** acetaminophen (TYLENOL) suppository 650 mg, 650 mg, Rectal, Q4H PRN, Newman Pies, MD .  amLODipine (NORVASC) tablet 10 mg, 10 mg, Oral, Daily, Doutova, Anastassia, MD, 10 mg at 11/23/16 0947 .  Chlorhexidine Gluconate Cloth 2 % PADS 6 each, 6 each, Topical, Q0600, Raiford Noble [place], [place], 6 each at 11/23/16 [phone] .  [COMPLETED] dexamethasone (DECADRON) tablet 4 mg, 4 mg, Oral, Q6H, 4 mg at 11/23/16 0604 **FOLLOWED BY** dexamethasone (DECADRON) tablet 4 mg, 4 mg, Oral,  Q8H, Newman Pies, MD .  docusate sodium (COLACE) capsule 100 mg, 100 mg, Oral, BID, Newman Pies, MD, 100 mg at 11/23/16 0947 .  HYDROcodone-acetaminophen (NORCO/VICODIN) 5-325 MG per tablet 1 tablet, 1 tablet, Oral, Q4H PRN, Newman Pies, MD, 1 tablet at 11/22/16 1812 .  insulin aspart (novoLOG) injection 0-20 Units, 0-20 Units, Subcutaneous, Q4H, Newman Pies, MD, 3 Units at 11/23/16 1257 .  labetalol (NORMODYNE,TRANDATE) injection 10-40 mg, 10-40 mg, Intravenous, Q10 min PRN, Newman Pies, MD, 20 mg at 11/22/16 0359 .  loratadine (CLARITIN) tablet 10 mg, 10 mg, Oral, Daily, Doutova, Anastassia, MD, 10 mg at 11/23/16 0947 .  meclizine (ANTIVERT) tablet 12.5 mg, 12.5 mg, Oral, TID PRN, Doutova, Anastassia, MD .  morphine 4 MG/ML injection 1-2 mg, 1-2 mg, Intravenous, Q2H PRN, Newman Pies, MD, 2 mg at 11/22/16 2058 .  mupirocin ointment (BACTROBAN) 2 % 1 application, 1 application, Nasal, BID, Raiford Noble [place], [place], 1 application at 43/32/95 [phone] .  ondansetron (ZOFRAN) tablet 4 mg, 4 mg, Oral, Q4H PRN **OR** ondansetron (ZOFRAN) injection 4 mg, 4 mg, Intravenous, Q4H PRN, Newman Pies, MD, 4 mg at 11/22/16 1026 .  pantoprazole (PROTONIX) EC tablet 40 mg, 40 mg, Oral, Daily, Raiford Noble [place], DO, 40 mg at 11/23/16 0947 .  pravastatin (PRAVACHOL) tablet 40 mg, 40 mg, Oral, Daily, Doutova, Anastassia, MD, 40 mg at 11/23/16 0947 .  promethazine (PHENERGAN) tablet 12.5-25 mg, 12.5-25 mg, Oral, Q4H PRN, Newman Pies, MD .  senna-docusate (Senokot-S) tablet 1 tablet, 1 tablet, Oral, BID, Raiford Noble [place], [place], 1 tablet at 11/23/16 [phone] .  sodium chloride flush (NS) 0.9 % injection 10-40 mL, 10-40 mL, Intracatheter, PRN, Sheikh, Omair Latif, DO .  tamoxifen (NOLVADEX) tablet 20 mg, 20 mg, Oral, Daily, Doutova, Anastassia, MD, 20 mg at 11/23/16 6301  Patients Current Diet: Diet regular Room service appropriate? Yes; Fluid consistency: Thin  Precautions /  Restrictions Precautions Precautions: Fall Restrictions Weight Bearing Restrictions: No   Has the patient had 2 or more falls or a fall with injury in the past year?No  Prior Activity Level Community (5-7x/wk): Independent and driving pta. works Estate manager/land agent at [employer] Devices/Equipment: [place]: Grab bars - tub/shower  Prior Device Use: Indicate devices/aids used by the patient prior to current illness, exacerbation or injury? None of the above  Prior Functional Level Prior Function Level of Independence: Independent Comments: ADls, IADLs, working  Self Care: Did the patient need help bathing, dressing, using the toilet or eating?  Independent  Indoor Mobility: Did the patient need assistance with walking from room to room (with or without device)? Independent  Stairs: Did the patient need assistance with internal or external stairs (with or without device)? Independent  Functional Cognition: Did the patient need help  planning regular tasks such as shopping or remembering to take medications? Independent  Current Functional Level Cognition  Overall Cognitive Status: Impaired/Different from baseline Current Attention Level: Selective Orientation Level: Oriented to person, Oriented to place, Oriented to situation, Disoriented to time Following Commands: Follows one step commands consistently, Follows one step commands with increased time Safety/Judgement: Decreased awareness of safety, Decreased awareness of deficits General Comments: Decreaesd awareness of deficits    Extremity Assessment (includes Sensation/Coordination)  Upper Extremity Assessment: RUE deficits/detail, LUE deficits/detail RUE Deficits / Details: Decreased coordination but more successful than L.  LUE Deficits / Details: Decreased coordination of LUE greater than R. Overshooting/undershooting and difficulty with rapid alternating  movement.   Lower Extremity Assessment: Defer to PT evaluation RLE Deficits / Details: Slight uncoordinated movements with alternating foot tapping. No abnormalities noted with heel to shin test. Strength WNL. LLE Deficits / Details: Slight uncoordinated movements with alternating foot tapping. No abnormalities noted with heel to shin test. Strength WNL.    ADLs  Overall ADL's : Needs assistance/impaired Eating/Feeding: Set up, Sitting Grooming: Min guard, Sitting (pt required A at times for midline orientation due to R bias) Upper Body Bathing: Minimal assistance (limited by pain) Lower Body Bathing: Minimal assistance, Sit to/from stand Upper Body Dressing : Minimal assistance Lower Body Dressing: Minimal assistance, Sit to/from stand Lower Body Dressing Details (indicate cue type and reason): Able to reach down and adjust socks Toilet Transfer: Minimal assistance, Stand-pivot Toileting- Clothing Manipulation and Hygiene: Minimal assistance Functional mobility during ADLs: Supervision/safety General ADL Comments: Supervision overall despite coordination deficits.     Mobility  Overal bed mobility: Needs Assistance Bed Mobility: Supine to Sit Supine to sit: Min assist General bed mobility comments: A due to being unsteady and significant R bias    Transfers  Overall transfer level: Needs assistance Equipment used: None Transfers: Sit to/from Stand Sit to Stand: Mod assist General transfer comment: poorly controlled sit - stand with significant anterior lean.     Ambulation / Gait / Stairs / Wheelchair Mobility  Ambulation/Gait Ambulation/Gait assistance: Min assist, +2 physical assistance, +2 safety/equipment Ambulation Distance (Feet): 7 Feet Assistive device: Rolling walker (2 wheeled) Gait Pattern/deviations: Step-through pattern, Decreased stride length, Drifts right/left General Gait Details: Slow and unsteady gait. Noted significant R lateral lean with standing mobility  and with 3 LOB. Pt keeping L eye closed throughout gait training 2 reported dizziness. States there is no double vision but she feels better with one eye closed.  Gait velocity: Decreased Gait velocity interpretation: Below normal speed for age/gender    Posture / Balance Balance Overall balance assessment: Needs assistance Sitting-balance support: No upper extremity supported, Feet supported Sitting balance-Leahy Scale: Fair Standing balance support: No upper extremity supported (leaning R) Standing balance-Leahy Scale: Poor (Required BUE support) Standardized Balance Assessment Standardized Balance Assessment : Dynamic Gait Index Dynamic Gait Index Level Surface: Normal Change in Gait Speed: Mild Impairment Gait with Horizontal Head Turns: Mild Impairment Gait with Vertical Head Turns: Mild Impairment Step Around Obstacles: Normal    Special needs/care consideration BiPAP/CPAP  N/a CPM  N/a Continuous Drip IV  N/a Dialysis  N/a Life Vest  N/a Oxygen  N/a Special Bed  N/a Trach Size  N/a Wound Vac n/a Skin surgical incision Bowel mgmt: continent LBM  11/20/16 Bladder mgmt: external catheter Diabetic mgmt  N/a   Previous Home Environment Living Arrangements:  (lives with a Ortiz)  Lives With: Ortiz Available Help at Discharge: Family, Available 24 hours/day (Ortiz from [place] states  she can take FMLA to provide ) Type of Home: Apartment Home Layout: One level Home Access: Stairs to enter Entrance Stairs-Rails: Right Entrance Stairs-Number of Steps: 13 Bathroom Shower/Tub: Tub/shower unit, Industrial/product designer: Yes How Accessible: Accessible via walker Home Care Services: No Additional Comments: pt seperated for over 5 years; not legally seperated  Discharge Living Setting Plans for Discharge Living Setting: Apartment Type of Home at Discharge: Apartment Discharge Home Layout: One level Discharge Home Access: Stairs  to enter Entrance Stairs-Rails: Right Entrance Stairs-Number of Steps: 13 steps Discharge Bathroom Shower/Tub: Tub/shower unit, Curtain Discharge Bathroom Toilet: Standard Discharge Bathroom Accessibility: Yes How Accessible: Accessible via walker Does the patient have any problems obtaining your medications?: No  Social/Family/Support Systems Patient Roles: Parent, Other (Comment) (fulltime employee; seperated for over 5 years) Contact Information: Levada Dy, Passenger transport manager who is local and pt live with Anticipated Caregiver: daughters Anticipated Ambulance person Information: see above Ability/Limitations of Caregiver: daughters noth work, Ortiz, Pollyann Glen, from [place] states she can take FMLA if needed Caregiver Availability: 24/7 Discharge Plan Discussed with Primary Caregiver: Yes Is Caregiver In Agreement with Plan?: Yes Does Caregiver/Family have Issues with Lodging/Transportation while Pt is in Rehab?: No (daughters stay with pt 24/7 in hospital)  Goals/Additional Needs Patient/Family Goal for Rehab: Mod I to superivsion with PT and OT Expected length of stay: ELOS 7-10 days Pt/Family Agrees to Admission and willing to participate: Yes Program Orientation Provided & Reviewed with Pt/Caregiver Including Roles  & Responsibilities: Yes  Barriers to Discharge: Home environment access/layout (follow up needed with oncology for possible chemo or radiati)  Decrease burden of Care through IP rehab admission: n/a  Possible need for SNF placement upon discharge: not anticipated  Patient Condition: This patient's condition remains as documented in the consult dated 11/22/2016, in which the Rehabilitation Physician determined and documented that the patient's condition is appropriate for intensive rehabilitative care in an inpatient rehabilitation facility. Will admit to inpatient rehab on 11/24/16 when bed is available.  Preadmission Screen Completed By:  Cleatrice Burke, 11/23/2016  1:55 PM ______________________________________________________________________   Discussed status with Dr. Naaman Plummer on 11/23/2016 at  1500 and received telephone approval for admission 11/24/2016 when bed is available.   Admission Coordinator:  Cleatrice Burke, time 1500 Date 11/23/2016.

## 2016-11-23 NOTE — Progress Notes (Signed)
I have insurance approval to admit pt to inpt rehab Saturday. I contacted Dr. [name] by phone and he is in agreement to admit Saturday. [phone]

## 2016-11-23 NOTE — Progress Notes (Signed)
I will begin insurance authorization for a possible inpt rehab admit when pt medically ready for d/c. I will meet with pt today to begin discussions concerning a possible inpt rehab admit. [phone]

## 2016-11-23 NOTE — Progress Notes (Signed)
Patient ID: Tasha Ortiz, female   DOB: 04/30/1955, 61 y.o.   MRN: [id number] Subjective:  the patient is alert and pleasant. She looks better today.  Objective: Vital signs in last 24 hours: Temp:  [98.6 F (37 C)-99.3 F (37.4 C)] 98.6 F (37 C) (08/24 0400) Pulse Rate:  [75-86] 78 (08/24 0700) Resp:  [13-20] 16 (08/24 0700) BP: (120-164)/(70-88) 133/79 (08/24 0700) SpO2:  [94 %-100 %] 97 % (08/24 0700)  Intake/Output from previous day: 08/23 0701 - 08/24 0700 In: 1875 [I.V.:1875] Out: 1425 [Urine:1425] Intake/Output this shift: No intake/output data recorded.  Physical exam the patient is alert  And oriented. Her left lateral gaze preference has resolved. Her dressing has a small old blood stained. He is moving all 4 extremities well.  I have reviewed the patient's postoperative brain MRI. It demonstrates edema in the resection cavity There some small infarcts. There is a small area of enhancementin the superior right resection bed. There is no hydrocephalus.  Lab Results:  Recent Labs  11/21/16 0539  WBC 18.5*  HGB 13.2  HCT 39.3  PLT 229   BMET  Recent Labs  11/21/16 0539  NA 141  K 4.4  CL 110  CO2 21*  GLUCOSE 243*  BUN 17  CREATININE 0.91  CALCIUM 9.6    Studies/Results: Mr Jeri Cos Wo Contrast  Result Date: 11/22/2016 CLINICAL DATA:  Postop day 1 right cerebellar tumor resection. History of breast cancer. EXAM: MRI HEAD WITHOUT AND WITH CONTRAST TECHNIQUE: Multiplanar, multiecho pulse sequences of the brain and surrounding structures were obtained without and with intravenous contrast. CONTRAST:  86mL MULTIHANCE GADOBENATE DIMEGLUMINE 529 MG/ML IV SOLN COMPARISON:  11/19/2016 FINDINGS: Brain: Sequelae of interval suboccipital craniectomy and cerebellar mass resection are identified. Blood products are present in the resection cavity. Pneumocephalus is noted, including gas in the lateral and third ventricles. A 4.1 x 2.4 x 5.7 cm extra-axial fluid  collection is present at the craniectomy site extending into the posterior soft tissues of the upper neck. There is moderate volume restricted diffusion involving the medial aspects of both cerebellar hemispheres and a portion of the remaining cerebellar vermis consistent with acute infarct. Associated cytotoxic edema is noted. There is also residual vasogenic edema the most notable in the right cerebellar hemisphere and superior vermis extending into the right middle cerebellar peduncle. Edema and blood products result in persistent partial effacement of the fourth ventricle which has mildly increased, however there is no significant dilatation of the lateral or third ventricles to indicate obstructive hydrocephalus. There is a 2.5 x 1.1 cm focus of enhancement in the medial right cerebellum at the level of the middle cerebellar peduncle (series 11, image 14), with tumor present in this location on the preoperative study. There is leptomeningeal enhancement involving the inferior aspects of both cerebellar hemispheres, likely related to the acute infarcts as this was not present on the preoperative study. No supratentorial mass is present. Small foci of T2 hyperintensity scattered throughout the subcortical and deep cerebral white matter bilaterally are unchanged and nonspecific but compatible with chronic small vessel ischemic disease, mild-to-moderate for age. Vascular: Major intracranial vascular flow voids are preserved, with the right vertebral artery appearing dominant. Skull and upper cervical spine: Suboccipital craniectomy. No suspicious marrow lesion. Sinuses/Orbits: Unremarkable orbits. Paranasal sinuses and mastoid air cells are clear. Other: None. IMPRESSION: 1. Postoperative changes from interval cerebellar mass resection as above. 2.5 cm focus of enhancement in the medial right cerebellum concerning for residual tumor. 2. Moderate-sized  acute infarct involving the inferior and medial cerebellar  hemispheres. 3. Slightly increased effacement of the fourth ventricle without evidence of obstructive hydrocephalus. Electronically Signed   By: Logan Bores M.D.   On: 11/22/2016 22:47   Dg Chest Port 1 View  Result Date: 11/21/2016 CLINICAL DATA:  Central line placement EXAM: PORTABLE CHEST 1 VIEW COMPARISON:  03/28/2013 CXR and 11/19/2016 CT FINDINGS: The heart size and mediastinal contours are within normal limits. Aortic atherosclerosis at the arch without aneurysm. Right IJ central line catheter tip is seen at the cavoatrial junction. No pneumothorax. Port catheter tip is seen in the proximal to mid SVC. Both lungs are clear. The visualized skeletal structures are unremarkable. Right axillary lymph node dissection clips are noted. IMPRESSION: 1. Central venous line catheter tip at the cavoatrial junction without pneumothorax. 2. Clear lungs. 3. Aortic atherosclerosis. Electronically Signed   By: Ashley Royalty M.D.   On: 11/21/2016 17:56    Assessme  Postop day #2: The patient is improving clinically. We will continue to observe this enhancement with serial MRIs. I'll ask rehabilitation to see the patient.I have answered all her questions.  LOS: 4 days     Arieal Cuoco D 11/23/2016, 8:03 AM

## 2016-11-23 NOTE — Progress Notes (Signed)
I met with pt and a daughter from [place] to discuss a possible inpt rehab admit. They are both in agreement.. Family can provide 24/7 supervision at d/c if needed with FMLA. I await insurance decision for a possible inpt rehab admit Saturday if felt medically ready. I will follow up today. [phone]

## 2016-11-23 NOTE — Care Management Note (Signed)
Case Management Note  Patient Details  Name: Tasha Ortiz MRN: [id number] Date of Birth: 02/19/1956  Subjective/Objective:  Pt admitted on 11/19/16 with cerebellar mass s/p tumor resection on 11/21/16.  PTA, pt resides at home with adult children, and is independent.                    Action/Plan: PT/OT recommending CIR.  Plan admission to inpatient rehab unit on Saturday, 11/24/16.    Expected Discharge Date:    11/24/16              Expected Discharge Plan:  [place]  In-House Referral:     Discharge planning Services  CM Consult  Post Acute Care Choice:    Choice offered to:     DME Arranged:    DME Agency:     HH Arranged:    HH Agency:     Status of Service:  Completed, signed off  If discussed at [employer], dates discussed:    Additional Comments:  Reinaldo Raddle, RN, BSN  Trauma/Neuro ICU Case Manager [phone]

## 2016-11-23 NOTE — Progress Notes (Signed)
Received from 4N via bed, assisted with transfer into bed.  Denies pain, speech clear, family at bedside, reinforced isolation precautions.  Reviewed plan of care, safety precautions, bed alarm.

## 2016-11-23 NOTE — H&P (Signed)
Physical Medicine and Rehabilitation Admission H&P    Chief Complaint  Patient presents with  . Nasal Congestion  . Dizziness  : HPI: Tasha Ortiz is a 61 y.o. right handed female with history of hypertension, right breast cancer treated with mastectomy subsequent chemotherapyAnd maintained on tamoxifen,  tobacco abuse.  Patient lives with daughter. Independent prior to admission and daughter works during the day. Presented 11/19/2016 with progressive vertigo ataxia occasional headaches. MRI obtained showed cerebellar lesion. CT chest abdomen pelvis negative for any metastatic disease. Underwent suboccipital craniectomy for gross total resection of cerebellar tumor 11/21/2016 per Dr. [name]. Postoperative MRI of the brain Showed postoperative changes after resection minimal residual tumor as well as moderate sized acute infarct involving the inferior and medial cerebellar hemispheres. Decadron protocol as indicated. MRSA PCR screen positive maintained on contact precautions. Postoperative Physical and occupational therapy evaluations completed 11/22/2016 with recommendations of physical medicine rehabilitation consult. Patient was admitted for comprehensive rehabilitation program  Review of Systems  Constitutional: Negative for chills and fever.  HENT: Negative for hearing loss and tinnitus.   Eyes: Negative for blurred vision and double vision.  Cardiovascular: Negative for chest pain, palpitations and leg swelling.  Gastrointestinal: Positive for constipation. Negative for nausea and vomiting.  Genitourinary: Negative for dysuria, hematuria and urgency.  Skin: Negative for rash.  Neurological: Positive for dizziness, weakness and headaches. Negative for seizures.  All other systems reviewed and are negative.  Reviewed with patient 8/25, no changes other than constipation improved as are headaches  Past Medical History:  Diagnosis Date  . Acid reflux   . Allergy    seasonal    . Breast cancer ([facility])    right,no iv or blood on right sid, chemo and radiation 2014  . Heartburn   . Hypercholesteremia    taken off chol meds Dec 2012  . Hypertension   . Hypertension   . Personal history of chemotherapy 05/2012   rt breast  . Personal history of radiation therapy 10/2012   rt breast   Past Surgical History:  Procedure Laterality Date  . BREAST BIOPSY Right 04/16/2012  . BREAST SURGERY Right 05/20/12   Rt br mastectomy  . CESAREAN SECTION  25 years ago  . colonoscopy    . COLONOSCOPY WITH PROPOFOL N/A 01/18/2015   Procedure: COLONOSCOPY WITH PROPOFOL;  Surgeon: Mauri Pole, MD;  Location: [facility];  Service: Endoscopy;  Laterality: N/A;  . MASTECTOMY Right 05/2012  . MODIFIED MASTECTOMY Right 05/20/2012   Procedure: MODIFIED MASTECTOMY;  Surgeon: Edward Jolly, MD;  Location: [facility];  Service: General;  Laterality: Right;  . PORTACATH PLACEMENT Left 05/20/2012   Procedure: INSERTION PORT-A-CATH;  Surgeon: Edward Jolly, MD;  Location: [facility];  Service: General;  Laterality: Left;  . SUBOCCIPITAL CRANIECTOMY CERVICAL LAMINECTOMY N/A 11/21/2016   Procedure: SUBOCCIPITAL CRANIECTOMY RESECTION TUMOR;  Surgeon: Newman Pies, MD;  Location: [facility];  Service: Neurosurgery;  Laterality: N/A;  . TUBAL LIGATION     Family History  Problem Relation Age of Onset  . Hypertension Mother   . Cancer Sister        brain  . Cancer Cousin        breast  . Healthy Father   . Healthy Maternal Grandmother   . Healthy Maternal Grandfather   . Healthy Paternal Grandmother   . Healthy Paternal Grandfather   . Diabetes Daughter   . Colon cancer Neg Hx    Social History:  reports that she  has never smoked. She quit smokeless tobacco use about 20 years ago. Her smokeless tobacco use included Chew. She reports that she does not drink alcohol or use drugs. Allergies:  Allergies  Allergen Reactions  . Lisinopril Swelling   Medications Prior to Admission   Medication Sig Dispense Refill  . acetaminophen (TYLENOL) 325 MG tablet Take 650 mg by mouth every 6 (six) hours as needed for moderate pain.    Marland Kitchen amLODipine (NORVASC) 10 MG tablet Take 1 tablet (10 mg total) by mouth daily. 90 tablet 3  . cetirizine (ZYRTEC) 10 MG tablet Take 1 tablet (10 mg total) by mouth daily. (Patient taking differently: Take 10 mg by mouth daily as needed. ) 30 tablet 11  . cyclobenzaprine (FLEXERIL) 5 MG tablet Take 1 tablet (5 mg total) by mouth 3 (three) times daily as needed for muscle spasms. 30 tablet 1  . fluticasone (FLONASE) 50 MCG/ACT nasal spray Place 2 sprays into both nostrils daily. 16 g 3  . meclizine (ANTIVERT) 12.5 MG tablet Take 1 tablet (12.5 mg total) by mouth 3 (three) times daily as needed for dizziness. 30 tablet 1  . naproxen sodium (ANAPROX) 220 MG tablet Take 220 mg by mouth 2 (two) times daily as needed (pain).    . tamoxifen (NOLVADEX) 20 MG tablet Take 1 tablet (20 mg total) by mouth daily. 90 tablet 3  . famotidine (PEPCID) 20 MG tablet Take 1 tablet (20 mg total) by mouth 2 (two) times daily as needed for heartburn or indigestion. (Patient not taking: Reported on 11/19/2016) 180 tablet 3  . oxymetazoline (AFRIN NASAL SPRAY) 0.05 % nasal spray Place 1 spray into both nostrils 2 (two) times daily. Use only for 3days, then stop (Patient taking differently: Place 1 spray into both nostrils 2 (two) times daily as needed for congestion. Use only for 3days, then stop) 30 mL 0  . pravastatin (PRAVACHOL) 40 MG tablet Take 1 tablet (40 mg total) by mouth daily. Annual appt is due w/labs must see provider for future refills 30 tablet 0    Home: Home Living Family/patient expects to be discharged to:: Private residence Living Arrangements: Children Available Help at Discharge: Family, Available PRN/intermittently Type of Home: Apartment Home Access: Stairs to enter Technical brewer of Steps: 13 Entrance Stairs-Rails: Right Home Layout: One  level Bathroom Shower/Tub: Chiropodist: Standard Home Equipment: Grab bars - tub/shower   Functional History: Prior Function Level of Independence: Independent Comments: ADls, IADLs, working  Functional Status:  Mobility: Bed Mobility Overal bed mobility: Needs Assistance Bed Mobility: Supine to Sit Supine to sit: Min assist General bed mobility comments: A due to being unsteady and significant R bias Transfers Overall transfer level: Needs assistance Equipment used: None Transfers: Sit to/from Stand Sit to Stand: Mod assist General transfer comment: poorly controlled sit - stand with significant anterior lean.  Ambulation/Gait Ambulation/Gait assistance: Min assist, +2 physical assistance, +2 safety/equipment Ambulation Distance (Feet): 7 Feet Assistive device: Rolling walker (2 wheeled) Gait Pattern/deviations: Step-through pattern, Decreased stride length, Drifts right/left General Gait Details: Slow and unsteady gait. Noted significant R lateral lean with standing mobility and with 3 LOB. Pt keeping L eye closed throughout gait training 2 reported dizziness. States there is no double vision but she feels better with one eye closed.  Gait velocity: Decreased Gait velocity interpretation: Below normal speed for age/gender    ADL: ADL Overall ADL's : Needs assistance/impaired Eating/Feeding: Set up, Sitting Grooming: Min guard, Sitting (pt required A at times  for midline orientation due to R bias) Upper Body Bathing: Minimal assistance (limited by pain) Lower Body Bathing: Minimal assistance, Sit to/from stand Upper Body Dressing : Minimal assistance Lower Body Dressing: Minimal assistance, Sit to/from stand Lower Body Dressing Details (indicate cue type and reason): Able to reach down and adjust socks Toilet Transfer: Minimal assistance, Stand-pivot Toileting- Clothing Manipulation and Hygiene: Minimal assistance Functional mobility during ADLs:  Supervision/safety General ADL Comments: Supervision overall despite coordination deficits.   Cognition: Cognition Overall Cognitive Status: Impaired/Different from baseline Orientation Level: Oriented to person, Oriented to place, Oriented to situation, Disoriented to time Cognition Arousal/Alertness: Awake/alert Behavior During Therapy: WFL for tasks assessed/performed Overall Cognitive Status: Impaired/Different from baseline Area of Impairment: Attention, Memory, Following commands, Safety/judgement, Problem solving Current Attention Level: Selective Memory: Decreased short-term memory, Decreased recall of precautions Following Commands: Follows one step commands consistently, Follows one step commands with increased time Safety/Judgement: Decreased awareness of safety, Decreased awareness of deficits Problem Solving: Slow processing, Decreased initiation, Difficulty sequencing, Requires verbal cues General Comments: Decreaesd awareness of deficits  Physical Exam: Blood pressure 136/86, pulse 81, temperature 98.6 F (37 C), temperature source Oral, resp. rate 17, height 5\' 4"  (1.626 m), weight 77.6 kg (171 lb), last menstrual period 08/16/2012, SpO2 97 %. Physical Exam  Vitals reviewed. Eyes: EOM are normal.  Pupils reactive to light  Neck: Normal range of motion. Neck supple. No thyromegaly present.  Cardiovascular: Normal rate, regular rhythm and normal heart sounds.   Respiratory: Effort normal and breath sounds normal. No respiratory distress.  GI: Soft. Bowel sounds are normal. She exhibits no distension.  Skin Craniotomy site clean and dry Neurological: She is alert.  Oriented to person place and date of birth. Follows simple commands. Mood is flat but appropriate. She makes good eye contact with examiner Moderate dysmetria, right finger-nose-finger, mild dysmetria, right heel-to-shin. No evidence of dysmetria, left finger-nose to finger or left shoulder shin. Motor  strength is 5/5 bilateral deltoid, biceps, triceps, grip, hip flexor, knee extensor, ankle dorsal flexor. Sensation intact to light touch bilateral upper and lower limb as well as facial region. Visual fields are intact, she does have evidence of nystagmus, mainly toward the right greater than total left horizontal  bhs exam on 8/25- nad Chest cta- cv- reg rate abd- soft, nt Ext- no edema     Results for orders placed or performed during the hospital encounter of 11/19/16 (from the past 48 hour(s))  Type and screen All Cardiac and thoracic surgeries, spinal fusions, myomectomies, craniotomies, colon & liver resections, total joint revisions, same day c-section with placenta previa or accreta.     Status: None (Preliminary result)   Collection Time: 11/21/16 10:00 AM  Result Value Ref Range   ABO/RH(D) B POS    Antibody Screen NEG    Sample Expiration 11/24/2016    Unit Number [id number]    Blood Component Type RED CELLS,LR    Unit division 00    Status of Unit REL FROM [facility]    Transfusion Status OK TO TRANSFUSE    Crossmatch Result Compatible    Unit Number [id number]    Blood Component Type RED CELLS,LR    Unit division 00    Status of Unit REL FROM [facility]    Transfusion Status OK TO TRANSFUSE    Crossmatch Result Compatible    Unit Number [id number]    Blood Component Type RED CELLS,LR    Unit division 00    Status of Unit ALLOCATED  Transfusion Status OK TO TRANSFUSE    Crossmatch Result Compatible    Unit Number [id number]    Blood Component Type RED CELLS,LR    Unit division 00    Status of Unit ALLOCATED    Transfusion Status OK TO TRANSFUSE    Crossmatch Result Compatible    Unit Number [id number]    Blood Component Type RED CELLS,LR    Unit division 00    Status of Unit REL FROM [facility]    Transfusion Status OK TO TRANSFUSE    Crossmatch Result Compatible    Unit Number [id number]    Blood Component Type RED CELLS,LR    Unit division 00      Status of Unit REL FROM [facility]    Transfusion Status OK TO TRANSFUSE    Crossmatch Result Compatible    Unit Number [id number]    Blood Component Type RED CELLS,LR    Unit division 00    Status of Unit REL FROM [facility]    Transfusion Status OK TO TRANSFUSE    Crossmatch Result Compatible    Unit Number [id number]    Blood Component Type RED CELLS,LR    Unit division 00    Status of Unit REL FROM [facility]    Transfusion Status OK TO TRANSFUSE    Crossmatch Result Compatible   ABO/Rh     Status: None   Collection Time: 11/21/16 10:00 AM  Result Value Ref Range   ABO/RH(D) B POS   Prepare RBC (crossmatch)     Status: None   Collection Time: 11/21/16 10:00 AM  Result Value Ref Range   Order Confirmation ORDER PROCESSED BY BLOOD BANK   Prepare RBC     Status: None   Collection Time: 11/21/16  4:30 PM  Result Value Ref Range   Order Confirmation ORDER PROCESSED BY BLOOD BANK   Glucose, capillary     Status: Abnormal   Collection Time: 11/22/16  8:20 AM  Result Value Ref Range   Glucose-Capillary 252 (H) 65 - 99 mg/dL   Comment 1 Notify RN   Glucose, capillary     Status: Abnormal   Collection Time: 11/22/16 11:41 AM  Result Value Ref Range   Glucose-Capillary 368 (H) 65 - 99 mg/dL   Comment 1 Notify RN   Hemoglobin A1c     Status: Abnormal   Collection Time: 11/22/16 12:42 PM  Result Value Ref Range   Hgb A1c MFr Bld 6.3 (H) 4.8 - 5.6 %    Comment: (NOTE) Pre diabetes:          5.7%-6.4% Diabetes:              >6.4% Glycemic control for   <7.0% adults with diabetes    Mean Plasma Glucose 134.11 mg/dL  Glucose, capillary     Status: Abnormal   Collection Time: 11/22/16  3:27 PM  Result Value Ref Range   Glucose-Capillary 174 (H) 65 - 99 mg/dL   Comment 1 Notify RN   Glucose, capillary     Status: Abnormal   Collection Time: 11/22/16  7:41 PM  Result Value Ref Range   Glucose-Capillary 135 (H) 65 - 99 mg/dL   Comment 1 Notify RN    Comment 2 Document in Chart    Glucose, capillary     Status: Abnormal   Collection Time: 11/22/16 11:31 PM  Result Value Ref Range   Glucose-Capillary 157 (H) 65 - 99 mg/dL   Comment 1 Notify RN    Comment 2  Document in Chart   Glucose, capillary     Status: Abnormal   Collection Time: 11/23/16  3:20 AM  Result Value Ref Range   Glucose-Capillary 183 (H) 65 - 99 mg/dL   Comment 1 Notify RN    Comment 2 Document in Chart    Mr Jeri Cos Wo Contrast  Result Date: 11/22/2016 CLINICAL DATA:  Postop day 1 right cerebellar tumor resection. History of breast cancer. EXAM: MRI HEAD WITHOUT AND WITH CONTRAST TECHNIQUE: Multiplanar, multiecho pulse sequences of the brain and surrounding structures were obtained without and with intravenous contrast. CONTRAST:  36mL MULTIHANCE GADOBENATE DIMEGLUMINE 529 MG/ML IV SOLN COMPARISON:  11/19/2016 FINDINGS: Brain: Sequelae of interval suboccipital craniectomy and cerebellar mass resection are identified. Blood products are present in the resection cavity. Pneumocephalus is noted, including gas in the lateral and third ventricles. A 4.1 x 2.4 x 5.7 cm extra-axial fluid collection is present at the craniectomy site extending into the posterior soft tissues of the upper neck. There is moderate volume restricted diffusion involving the medial aspects of both cerebellar hemispheres and a portion of the remaining cerebellar vermis consistent with acute infarct. Associated cytotoxic edema is noted. There is also residual vasogenic edema the most notable in the right cerebellar hemisphere and superior vermis extending into the right middle cerebellar peduncle. Edema and blood products result in persistent partial effacement of the fourth ventricle which has mildly increased, however there is no significant dilatation of the lateral or third ventricles to indicate obstructive hydrocephalus. There is a 2.5 x 1.1 cm focus of enhancement in the medial right cerebellum at the level of the middle cerebellar  peduncle (series 11, image 14), with tumor present in this location on the preoperative study. There is leptomeningeal enhancement involving the inferior aspects of both cerebellar hemispheres, likely related to the acute infarcts as this was not present on the preoperative study. No supratentorial mass is present. Small foci of T2 hyperintensity scattered throughout the subcortical and deep cerebral white matter bilaterally are unchanged and nonspecific but compatible with chronic small vessel ischemic disease, mild-to-moderate for age. Vascular: Major intracranial vascular flow voids are preserved, with the right vertebral artery appearing dominant. Skull and upper cervical spine: Suboccipital craniectomy. No suspicious marrow lesion. Sinuses/Orbits: Unremarkable orbits. Paranasal sinuses and mastoid air cells are clear. Other: None. IMPRESSION: 1. Postoperative changes from interval cerebellar mass resection as above. 2.5 cm focus of enhancement in the medial right cerebellum concerning for residual tumor. 2. Moderate-sized acute infarct involving the inferior and medial cerebellar hemispheres. 3. Slightly increased effacement of the fourth ventricle without evidence of obstructive hydrocephalus. Electronically Signed   By: Logan Bores M.D.   On: 11/22/2016 22:47   Dg Chest Port 1 View  Result Date: 11/21/2016 CLINICAL DATA:  Central line placement EXAM: PORTABLE CHEST 1 VIEW COMPARISON:  03/28/2013 CXR and 11/19/2016 CT FINDINGS: The heart size and mediastinal contours are within normal limits. Aortic atherosclerosis at the arch without aneurysm. Right IJ central line catheter tip is seen at the cavoatrial junction. No pneumothorax. Port catheter tip is seen in the proximal to mid SVC. Both lungs are clear. The visualized skeletal structures are unremarkable. Right axillary lymph node dissection clips are noted. IMPRESSION: 1. Central venous line catheter tip at the cavoatrial junction without pneumothorax.  2. Clear lungs. 3. Aortic atherosclerosis. Electronically Signed   By: Ashley Royalty M.D.   On: 11/21/2016 17:56       Medical Problem List and Plan: 1.  Right side  weakness with gait disorder secondary to right cerebellar mass status post craniotomy 11/21/2016 2.  DVT Prophylaxis/Anticoagulation: SCDs. Monitor for any signs of DVT 3. Pain Management: Hydrocodone as needed 4. Mood: Provide emotional support 5. Neuropsych: This patient is capable of making decisions on her own behalf. 6. Skin/Wound Care: Routine skin checks 7. Fluids/Electrolytes/Nutrition: Routine I&O with follow-up chemistries 8. Hypertension. Norvasc 10 mg daily 9. History of right breast cancer with mastectomy as well as chemotherapy. Continue tamoxifen daily 10. Hyperlipidemia. Pravachol 11. MRSA PCR screening positive. Contact precautions        ANGIULLI,DANIEL J., PA-C 11/23/2016

## 2016-11-24 ENCOUNTER — Inpatient Hospital Stay (HOSPITAL_COMMUNITY)
Admission: RE | Admit: 2016-11-24 | Discharge: 2016-12-05 | DRG: 092 | Disposition: A | Discharge status: Home Health | Payer: BLUE CROSS/BLUE SHIELD | Source: Intra-hospital | Attending: Physical Medicine & Rehabilitation | Admitting: Physical Medicine & Rehabilitation

## 2016-11-24 ENCOUNTER — Encounter (HOSPITAL_COMMUNITY): Payer: Self-pay | Admitting: *Deleted

## 2016-11-24 DIAGNOSIS — T380X5A Adverse effect of glucocorticoids and synthetic analogues, initial encounter: Secondary | ICD-10-CM

## 2016-11-24 DIAGNOSIS — Z22322 Carrier or suspected carrier of Methicillin resistant Staphylococcus aureus: Secondary | ICD-10-CM | POA: Diagnosis not present

## 2016-11-24 DIAGNOSIS — Z833 Family history of diabetes mellitus: Secondary | ICD-10-CM | POA: Diagnosis not present

## 2016-11-24 DIAGNOSIS — K219 Gastro-esophageal reflux disease without esophagitis: Secondary | ICD-10-CM | POA: Diagnosis present

## 2016-11-24 DIAGNOSIS — Z808 Family history of malignant neoplasm of other organs or systems: Secondary | ICD-10-CM

## 2016-11-24 DIAGNOSIS — R569 Unspecified convulsions: Secondary | ICD-10-CM | POA: Diagnosis not present

## 2016-11-24 DIAGNOSIS — Z79810 Long term (current) use of selective estrogen receptor modulators (SERMs): Secondary | ICD-10-CM | POA: Diagnosis not present

## 2016-11-24 DIAGNOSIS — Z8249 Family history of ischemic heart disease and other diseases of the circulatory system: Secondary | ICD-10-CM | POA: Diagnosis not present

## 2016-11-24 DIAGNOSIS — R2689 Other abnormalities of gait and mobility: Secondary | ICD-10-CM | POA: Diagnosis present

## 2016-11-24 DIAGNOSIS — D496 Neoplasm of unspecified behavior of brain: Secondary | ICD-10-CM | POA: Diagnosis not present

## 2016-11-24 DIAGNOSIS — R739 Hyperglycemia, unspecified: Secondary | ICD-10-CM | POA: Diagnosis present

## 2016-11-24 DIAGNOSIS — Z923 Personal history of irradiation: Secondary | ICD-10-CM

## 2016-11-24 DIAGNOSIS — Z9221 Personal history of antineoplastic chemotherapy: Secondary | ICD-10-CM

## 2016-11-24 DIAGNOSIS — Z9011 Acquired absence of right breast and nipple: Secondary | ICD-10-CM

## 2016-11-24 DIAGNOSIS — C50411 Malignant neoplasm of upper-outer quadrant of right female breast: Secondary | ICD-10-CM | POA: Diagnosis not present

## 2016-11-24 DIAGNOSIS — C7931 Secondary malignant neoplasm of brain: Secondary | ICD-10-CM | POA: Diagnosis present

## 2016-11-24 DIAGNOSIS — Z853 Personal history of malignant neoplasm of breast: Secondary | ICD-10-CM | POA: Diagnosis not present

## 2016-11-24 DIAGNOSIS — R55 Syncope and collapse: Secondary | ICD-10-CM | POA: Diagnosis not present

## 2016-11-24 DIAGNOSIS — Z79899 Other long term (current) drug therapy: Secondary | ICD-10-CM

## 2016-11-24 DIAGNOSIS — R110 Nausea: Secondary | ICD-10-CM | POA: Diagnosis present

## 2016-11-24 DIAGNOSIS — Z803 Family history of malignant neoplasm of breast: Secondary | ICD-10-CM

## 2016-11-24 DIAGNOSIS — E785 Hyperlipidemia, unspecified: Secondary | ICD-10-CM | POA: Diagnosis present

## 2016-11-24 DIAGNOSIS — I10 Essential (primary) hypertension: Secondary | ICD-10-CM | POA: Diagnosis present

## 2016-11-24 DIAGNOSIS — Z87891 Personal history of nicotine dependence: Secondary | ICD-10-CM | POA: Diagnosis not present

## 2016-11-24 DIAGNOSIS — H532 Diplopia: Secondary | ICD-10-CM | POA: Diagnosis present

## 2016-11-24 DIAGNOSIS — R531 Weakness: Secondary | ICD-10-CM | POA: Diagnosis present

## 2016-11-24 DIAGNOSIS — R509 Fever, unspecified: Secondary | ICD-10-CM | POA: Diagnosis not present

## 2016-11-24 DIAGNOSIS — Z48811 Encounter for surgical aftercare following surgery on the nervous system: Secondary | ICD-10-CM

## 2016-11-24 DIAGNOSIS — C50919 Malignant neoplasm of unspecified site of unspecified female breast: Secondary | ICD-10-CM | POA: Diagnosis not present

## 2016-11-24 LAB — GLUCOSE, CAPILLARY
Glucose-Capillary: 143 mg/dL — ABNORMAL HIGH (ref 65–99)
Glucose-Capillary: 148 mg/dL — ABNORMAL HIGH (ref 65–99)
Glucose-Capillary: 157 mg/dL — ABNORMAL HIGH (ref 65–99)
Glucose-Capillary: 148 mg/dL — ABNORMAL HIGH (ref 65–99)
Glucose-Capillary: 226 mg/dL — ABNORMAL HIGH (ref 65–99)

## 2016-11-24 MED ORDER — AMLODIPINE BESYLATE 10 MG PO TABS
10.0000 mg | ORAL_TABLET | Freq: Every day | ORAL | Status: DC
  Administered 2016-11-25 – 2016-12-05 (×10): 10 mg via ORAL
  Filled 2016-11-24 (×11): qty 1

## 2016-11-24 MED ORDER — SENNOSIDES-DOCUSATE SODIUM 8.6-50 MG PO TABS
1.0000 | ORAL_TABLET | Freq: Two times a day (BID) | ORAL | Status: DC
  Administered 2016-11-24 – 2016-12-05 (×21): 1 via ORAL
  Filled 2016-11-24 (×22): qty 1

## 2016-11-24 MED ORDER — TAMOXIFEN CITRATE 10 MG PO TABS
20.0000 mg | ORAL_TABLET | Freq: Every day | ORAL | Status: DC
  Administered 2016-11-25 – 2016-12-05 (×11): 20 mg via ORAL
  Filled 2016-11-24 (×11): qty 2

## 2016-11-24 MED ORDER — PANTOPRAZOLE SODIUM 40 MG PO TBEC
40.0000 mg | DELAYED_RELEASE_TABLET | Freq: Every day | ORAL | Status: DC
  Administered 2016-11-25 – 2016-12-05 (×11): 40 mg via ORAL
  Filled 2016-11-24 (×11): qty 1

## 2016-11-24 MED ORDER — HYDROCODONE-ACETAMINOPHEN 5-325 MG PO TABS: 1.0000 | ORAL_TABLET | ORAL | 0 refills | Status: DC | PRN

## 2016-11-24 MED ORDER — PRAVASTATIN SODIUM 40 MG PO TABS
40.0000 mg | ORAL_TABLET | Freq: Every day | ORAL | Status: DC
  Administered 2016-11-25 – 2016-12-05 (×11): 40 mg via ORAL
  Filled 2016-11-24 (×11): qty 1

## 2016-11-24 MED ORDER — INSULIN ASPART 100 UNIT/ML ~~LOC~~ SOLN
0.0000 [IU] | SUBCUTANEOUS | Status: DC
  Administered 2016-11-24: 7 [IU] via SUBCUTANEOUS
  Administered 2016-11-24 – 2016-11-25 (×4): 3 [IU] via SUBCUTANEOUS

## 2016-11-24 MED ORDER — MECLIZINE HCL 25 MG PO TABS: 12.5000 mg | ORAL_TABLET | Freq: Three times a day (TID) | ORAL | Status: DC | PRN

## 2016-11-24 MED ORDER — ONDANSETRON HCL 4 MG/2ML IJ SOLN: 4.0000 mg | Freq: Four times a day (QID) | INTRAMUSCULAR | Status: DC | PRN

## 2016-11-24 MED ORDER — ACETAMINOPHEN 325 MG PO TABS
650.0000 mg | ORAL_TABLET | ORAL | Status: DC | PRN
  Administered 2016-11-24 – 2016-12-02 (×2): 650 mg via ORAL
  Filled 2016-11-24 (×2): qty 2

## 2016-11-24 MED ORDER — METHYLPREDNISOLONE 4 MG PO TBPK: ORAL_TABLET | ORAL | 0 refills | Status: DC

## 2016-11-24 MED ORDER — DOCUSATE SODIUM 100 MG PO CAPS
100.0000 mg | ORAL_CAPSULE | Freq: Two times a day (BID) | ORAL | Status: DC
  Administered 2016-11-24 – 2016-12-05 (×21): 100 mg via ORAL
  Filled 2016-11-24 (×22): qty 1

## 2016-11-24 MED ORDER — SORBITOL 70 % SOLN
30.0000 mL | Freq: Every day | Status: DC | PRN
  Administered 2016-11-24: 30 mL via ORAL
  Filled 2016-11-24 (×2): qty 30

## 2016-11-24 MED ORDER — DEXAMETHASONE 4 MG PO TABS
4.0000 mg | ORAL_TABLET | Freq: Three times a day (TID) | ORAL | Status: DC
  Administered 2016-11-24 – 2016-11-30 (×17): 4 mg via ORAL
  Filled 2016-11-24 (×17): qty 1

## 2016-11-24 MED ORDER — HYDROCODONE-ACETAMINOPHEN 5-325 MG PO TABS
1.0000 | ORAL_TABLET | ORAL | Status: DC | PRN
  Administered 2016-12-02 – 2016-12-04 (×3): 1 via ORAL
  Filled 2016-11-24 (×4): qty 1

## 2016-11-24 MED ORDER — LORATADINE 10 MG PO TABS
10.0000 mg | ORAL_TABLET | Freq: Every day | ORAL | Status: DC
  Administered 2016-11-25 – 2016-12-05 (×11): 10 mg via ORAL
  Filled 2016-11-24 (×11): qty 1

## 2016-11-24 MED ORDER — ACETAMINOPHEN 650 MG RE SUPP: 650.0000 mg | RECTAL | Status: DC | PRN

## 2016-11-24 MED ORDER — MUPIROCIN 2 % EX OINT
1.0000 | TOPICAL_OINTMENT | Freq: Two times a day (BID) | CUTANEOUS | Status: AC
Start: 2016-11-24 — End: 2016-11-26
  Administered 2016-11-24 – 2016-11-26 (×5): 1 via NASAL
  Filled 2016-11-24: qty 22

## 2016-11-24 MED ORDER — ENSURE ENLIVE PO LIQD
237.0000 mL | Freq: Two times a day (BID) | ORAL | Status: DC
  Administered 2016-11-24 – 2016-12-05 (×11): 237 mL via ORAL

## 2016-11-24 MED ORDER — ONDANSETRON HCL 4 MG PO TABS: 4.0000 mg | ORAL_TABLET | Freq: Four times a day (QID) | ORAL | Status: DC | PRN

## 2016-11-24 NOTE — Progress Notes (Signed)
Report called to Rehab RN before patient had her lunch, patient ready for transport; family at bedside; all belongings returned.

## 2016-11-24 NOTE — Plan of Care (Signed)
Problem: RH BOWEL ELIMINATION Goal: RH STG MANAGE BOWEL W/MEDICATION W/ASSISTANCE STG Manage Bowel with Medication min with Assistance.  Outcome: Not Progressing Per report colace and laxative given prior to transport to rehab

## 2016-11-24 NOTE — Progress Notes (Signed)
Cristina Gong, RN Rehab Admission Coordinator Signed Physical Medicine and Rehabilitation  PMR Pre-admission Date of Service: 11/23/2016 1:54 PM  Related encounter: ED to Hosp-Admission (Discharged) from 11/19/2016 in [place]       [] Hide copied text PMR Admission Coordinator Pre-Admission Assessment  Patient: Tasha Ortiz is an 61 y.o., female MRN: [id number] DOB: 04-28-1955 Height: 5\' 4"  (162.6 cm) Weight: 77.6 kg (171 lb)                                                                                                                                                  Insurance Information HMO:     PPO: yes     PCP:      IPA:      80/20:      OTHER:  PRIMARY: [employer] [place] group      Policy#: [id number]      Subscriber: pt CM Name: Larene Beach      Phone#: [phone]     Fax#: [phone] Pre-Cert#: [id number] auth for 11/24/16 until 12/07/16 when updates are due     Employer: [employer] Benefits:  Phone #: [phone]     Name: 11/23/16 Eff. Date: 04/02/16     Deduct: $1000      Out of Pocket Max: $5500      Life Max: none CIR: 70%      SNF: 70% 120 days Outpatient: $65 co pay per visit     Co-Pay: visits per medical neccesity Home Health: 70%      Co-Pay: 100 visits combined DME: 70%     Co-Pay: 30% Providers: in network  SECONDARY: none        Medicaid Application Date:       Case Manager:  Disability Application Date:       Case Worker:   Emergency Contact Information        Contact Information    Name Relation Home Work [place] Daughter [phone]     Darleny, Sem Daughter [phone]       Current Medical History  Patient Admitting Diagnosis: status post suboccipital craniectomy and resection for right cerebellar mass  History of Present Illness:     : HPI: Tasha Sepulveda Mitchellis a 61 y.o.right handed femalewith history of hypertension, right breast cancer treated with  mastectomy subsequent chemotherapy And maintained on tamoxifen, tobacco abuse. Presented 11/19/2016 with progressive vertigo ataxia occasional headaches. MRI obtained showed cerebellar lesion. CT chest abdomen pelvis negative for any metastatic disease. Underwent suboccipital craniectomy for gross total resection of cerebellar tumor 11/21/2016 per Dr. [name]. Postoperative MRI of the brain Showed postoperative changes after resection minimal residual tumor as well as moderate sized acute infarct involving the inferior and medial cerebellar hemispheres. Decadron protocol as indicated. MRSA PCR screen positive maintained on contact precautions.  Total: 2 NIHSS  Past Medical History      Past Medical History:  Diagnosis Date  . Acid reflux   . Allergy    seasonal  . Breast cancer ([place])    right,no iv or blood on right sid, chemo and radiation 2014  . Heartburn   . Hypercholesteremia    taken off chol meds Dec 2012  . Hypertension   . Hypertension   . Personal history of chemotherapy 05/2012   rt breast  . Personal history of radiation therapy 10/2012   rt breast    Family History  family history includes Cancer in her cousin and sister; Diabetes in her daughter; Healthy in her father, maternal grandfather, maternal grandmother, paternal grandfather, and paternal grandmother; Hypertension in her mother.  Prior Rehab/Hospitalizations:  Has the patient had major surgery during 100 days prior to admission? No  Current Medications   Current Facility-Administered Medications:  .  0.9 %  sodium chloride infusion, , Intravenous, Continuous, Lacretia Leigh, MD, Last Rate: 20 mL/hr at 11/20/16 1750 .  0.9 %  sodium chloride infusion, , Intravenous, Once, Belinda Block, MD .  0.9 % NaCl with KCl 20 mEq/ L  infusion, , Intravenous, Continuous, Newman Pies, MD, Last Rate: 75 mL/hr at 11/23/16 0409 .  acetaminophen (TYLENOL) tablet 650 mg, 650 mg, Oral, Q4H PRN **OR**  acetaminophen (TYLENOL) suppository 650 mg, 650 mg, Rectal, Q4H PRN, Newman Pies, MD .  amLODipine (NORVASC) tablet 10 mg, 10 mg, Oral, Daily, Doutova, Anastassia, MD, 10 mg at 11/23/16 0947 .  Chlorhexidine Gluconate Cloth 2 % PADS 6 each, 6 each, Topical, Q0600, Raiford Noble [place], [place], 6 each at 11/23/16 [phone] .  [COMPLETED] dexamethasone (DECADRON) tablet 4 mg, 4 mg, Oral, Q6H, 4 mg at 11/23/16 0604 **FOLLOWED BY** dexamethasone (DECADRON) tablet 4 mg, 4 mg, Oral, Q8H, Newman Pies, MD .  docusate sodium (COLACE) capsule 100 mg, 100 mg, Oral, BID, Newman Pies, MD, 100 mg at 11/23/16 0947 .  HYDROcodone-acetaminophen (NORCO/VICODIN) 5-325 MG per tablet 1 tablet, 1 tablet, Oral, Q4H PRN, Newman Pies, MD, 1 tablet at 11/22/16 1812 .  insulin aspart (novoLOG) injection 0-20 Units, 0-20 Units, Subcutaneous, Q4H, Newman Pies, MD, 3 Units at 11/23/16 1257 .  labetalol (NORMODYNE,TRANDATE) injection 10-40 mg, 10-40 mg, Intravenous, Q10 min PRN, Newman Pies, MD, 20 mg at 11/22/16 0359 .  loratadine (CLARITIN) tablet 10 mg, 10 mg, Oral, Daily, Doutova, Anastassia, MD, 10 mg at 11/23/16 0947 .  meclizine (ANTIVERT) tablet 12.5 mg, 12.5 mg, Oral, TID PRN, Doutova, Anastassia, MD .  morphine 4 MG/ML injection 1-2 mg, 1-2 mg, Intravenous, Q2H PRN, Newman Pies, MD, 2 mg at 11/22/16 2058 .  mupirocin ointment (BACTROBAN) 2 % 1 application, 1 application, Nasal, BID, Raiford Noble [place], [place], 1 application at 89/38/10 [phone] .  ondansetron (ZOFRAN) tablet 4 mg, 4 mg, Oral, Q4H PRN **OR** ondansetron (ZOFRAN) injection 4 mg, 4 mg, Intravenous, Q4H PRN, Newman Pies, MD, 4 mg at 11/22/16 1026 .  pantoprazole (PROTONIX) EC tablet 40 mg, 40 mg, Oral, Daily, Raiford Noble [place], DO, 40 mg at 11/23/16 0947 .  pravastatin (PRAVACHOL) tablet 40 mg, 40 mg, Oral, Daily, Doutova, Anastassia, MD, 40 mg at 11/23/16 0947 .  promethazine (PHENERGAN) tablet 12.5-25 mg, 12.5-25 mg, Oral, Q4H PRN,  Newman Pies, MD .  senna-docusate (Senokot-S) tablet 1 tablet, 1 tablet, Oral, BID, Raiford Noble [place], [place], 1 tablet at 11/23/16 [phone] .  sodium chloride flush (NS) 0.9 % injection 10-40 mL, 10-40 mL, Intracatheter, PRN, Alfredia Ferguson, Omair Latif,  DO .  tamoxifen (NOLVADEX) tablet 20 mg, 20 mg, Oral, Daily, Doutova, Anastassia, MD, 20 mg at 11/23/16 0947  Patients Current Diet: Diet regular Room service appropriate? Yes; Fluid consistency: Thin  Precautions / Restrictions Precautions Precautions: Fall Restrictions Weight Bearing Restrictions: No   Has the patient had 2 or more falls or a fall with injury in the past year?No  Prior Activity Level Community (5-7x/wk): Independent and driving pta. works Estate manager/land agent at [employer] Devices/Equipment: Lapel: Grab bars - tub/shower  Prior Device Use: Indicate devices/aids used by the patient prior to current illness, exacerbation or injury? None of the above  Prior Functional Level Prior Function Level of Independence: Independent Comments: ADls, IADLs, working  Self Care: Did the patient need help bathing, dressing, using the toilet or eating?  Independent  Indoor Mobility: Did the patient need assistance with walking from room to room (with or without device)? Independent  Stairs: Did the patient need assistance with internal or external stairs (with or without device)? Independent  Functional Cognition: Did the patient need help planning regular tasks such as shopping or remembering to take medications? Independent  Current Functional Level Cognition  Overall Cognitive Status: Impaired/Different from baseline Current Attention Level: Selective Orientation Level: Oriented to person, Oriented to place, Oriented to situation, Disoriented to time Following Commands: Follows one step commands consistently, Follows one step commands with increased  time Safety/Judgement: Decreased awareness of safety, Decreased awareness of deficits General Comments: Decreaesd awareness of deficits    Extremity Assessment (includes Sensation/Coordination)  Upper Extremity Assessment: RUE deficits/detail, LUE deficits/detail RUE Deficits / Details: Decreased coordination but more successful than L.  LUE Deficits / Details: Decreased coordination of LUE greater than R. Overshooting/undershooting and difficulty with rapid alternating movement.   Lower Extremity Assessment: Defer to PT evaluation RLE Deficits / Details: Slight uncoordinated movements with alternating foot tapping. No abnormalities noted with heel to shin test. Strength WNL. LLE Deficits / Details: Slight uncoordinated movements with alternating foot tapping. No abnormalities noted with heel to shin test. Strength WNL.    ADLs  Overall ADL's : Needs assistance/impaired Eating/Feeding: Set up, Sitting Grooming: Min guard, Sitting (pt required A at times for midline orientation due to R bias) Upper Body Bathing: Minimal assistance (limited by pain) Lower Body Bathing: Minimal assistance, Sit to/from stand Upper Body Dressing : Minimal assistance Lower Body Dressing: Minimal assistance, Sit to/from stand Lower Body Dressing Details (indicate cue type and reason): Able to reach down and adjust socks Toilet Transfer: Minimal assistance, Stand-pivot Toileting- Clothing Manipulation and Hygiene: Minimal assistance Functional mobility during ADLs: Supervision/safety General ADL Comments: Supervision overall despite coordination deficits.     Mobility  Overal bed mobility: Needs Assistance Bed Mobility: Supine to Sit Supine to sit: Min assist General bed mobility comments: A due to being unsteady and significant R bias    Transfers  Overall transfer level: Needs assistance Equipment used: None Transfers: Sit to/from Stand Sit to Stand: Mod assist General transfer comment: poorly  controlled sit - stand with significant anterior lean.     Ambulation / Gait / Stairs / Wheelchair Mobility  Ambulation/Gait Ambulation/Gait assistance: Min assist, +2 physical assistance, +2 safety/equipment Ambulation Distance (Feet): 7 Feet Assistive device: Rolling walker (2 wheeled) Gait Pattern/deviations: Step-through pattern, Decreased stride length, Drifts right/left General Gait Details: Slow and unsteady gait. Noted significant R lateral lean with standing mobility and with 3 LOB. Pt keeping L eye closed throughout gait training 2  reported dizziness. States there is no double vision but she feels better with one eye closed.  Gait velocity: Decreased Gait velocity interpretation: Below normal speed for age/gender    Posture / Balance Balance Overall balance assessment: Needs assistance Sitting-balance support: No upper extremity supported, Feet supported Sitting balance-Leahy Scale: Fair Standing balance support: No upper extremity supported (leaning R) Standing balance-Leahy Scale: Poor (Required BUE support) Standardized Balance Assessment Standardized Balance Assessment : Dynamic Gait Index Dynamic Gait Index Level Surface: Normal Change in Gait Speed: Mild Impairment Gait with Horizontal Head Turns: Mild Impairment Gait with Vertical Head Turns: Mild Impairment Step Around Obstacles: Normal    Special needs/care consideration BiPAP/CPAP  N/a CPM  N/a Continuous Drip IV  N/a Dialysis  N/a Life Vest  N/a Oxygen  N/a Special Bed  N/a Trach Size  N/a Wound Vac n/a Skin surgical incision Bowel mgmt: continent LBM  11/20/16 Bladder mgmt: external catheter Diabetic mgmt  N/a   Previous Home Environment Living Arrangements:  (lives with a daughter)  Lives With: Daughter Available Help at Discharge: Family, Available 24 hours/day (daughter from [place] states she can take FMLA to provide ) Type of Home: Apartment Home Layout: One level Home Access: Stairs to  enter Entrance Stairs-Rails: Right Entrance Stairs-Number of Steps: 13 Bathroom Shower/Tub: Public librarian, Architectural technologist: Standard Bathroom Accessibility: Yes How Accessible: Accessible via walker Home Care Services: No Additional Comments: pt seperated for over 5 years; not legally seperated  Discharge Living Setting Plans for Discharge Living Setting: Apartment Type of Home at Discharge: Apartment Discharge Home Layout: One level Discharge Home Access: Stairs to enter Entrance Stairs-Rails: Right Entrance Stairs-Number of Steps: 13 steps Discharge Bathroom Shower/Tub: Tub/shower unit, Curtain Discharge Bathroom Toilet: Standard Discharge Bathroom Accessibility: Yes How Accessible: Accessible via walker Does the patient have any problems obtaining your medications?: No  Social/Family/Support Systems Patient Roles: Parent, Other (Comment) (fulltime employee; seperated for over 5 years) Contact Information: Levada Dy, Passenger transport manager who is local and pt live with Anticipated Caregiver: daughters Anticipated Ambulance person Information: see above Ability/Limitations of Caregiver: daughters noth work, daughter, Pollyann Glen, from [place] states she can take FMLA if needed Caregiver Availability: 24/7 Discharge Plan Discussed with Primary Caregiver: Yes Is Caregiver In Agreement with Plan?: Yes Does Caregiver/Family have Issues with Lodging/Transportation while Pt is in Rehab?: No (daughters stay with pt 24/7 in hospital)  Goals/Additional Needs Patient/Family Goal for Rehab: Mod I to superivsion with PT and OT Expected length of stay: ELOS 7-10 days Pt/Family Agrees to Admission and willing to participate: Yes Program Orientation Provided & Reviewed with Pt/Caregiver Including Roles  & Responsibilities: Yes  Barriers to Discharge: Home environment access/layout (follow up needed with oncology for possible chemo or radiati)  Decrease burden of Care through IP rehab  admission: n/a  Possible need for SNF placement upon discharge: not anticipated  Patient Condition: This patient's condition remains as documented in the consult dated 11/22/2016, in which the Rehabilitation Physician determined and documented that the patient's condition is appropriate for intensive rehabilitative care in an inpatient rehabilitation facility. Will admit to inpatient rehab on 11/24/16 when bed is available.  Preadmission Screen Completed By:  Cleatrice Burke, 11/23/2016 1:55 PM ______________________________________________________________________   Discussed status with Dr. Naaman Plummer on 11/23/2016 at  1500 and received telephone approval for admission 11/24/2016 when bed is available.   Admission Coordinator:  Cleatrice Burke, time 1500 Date 11/23/2016.       Cosigned by: Meredith Staggers, MD at 11/23/2016 3:28 PM  Revision  History

## 2016-11-24 NOTE — Discharge Summary (Signed)
Physician Discharge Summary  Patient ID: Tasha Ortiz MRN: [id number] DOB/AGE: 05-17-1955 61 y.o.  Admit date: 11/19/2016 Discharge date: 11/24/2016  Admission Diagnoses:  Brain mass  Discharge Diagnoses:  Same Active Problems:   Hyperlipidemia   Essential hypertension, benign   Allergic rhinitis   Acid reflux   Primary cancer of upper outer quadrant of right female breast (HCC)   Constipation   Vertigo   Brain mass   Fatty liver disease, nonalcoholic   Metastasis to brain of unknown origin [facility])  Discharged Condition: Stable  Hospital Course:  Tasha Ortiz is a 61 y.o. female who was admitted for the below procedure. There were no post operative complications. At time of discharge, pain was well controlled, ambulating with Pt/OT, tolerating po, voiding normal. Ready for discharge. PT/OT rec CIR. Agree with this. She has insurance approval and bed available today.  Treatments: Surgery - Suboccipital craniectomy for gross total resection of cerebellar tumor using microdissection  Discharge Exam: Blood pressure 133/81, pulse 81, temperature 99 F (37.2 C), temperature source Oral, resp. rate 20, height 5\' 4"  (1.626 m), weight 75.8 kg (167 lb 3.2 oz), last menstrual period 08/16/2012, SpO2 99 %. Awake, alert, oriented Speech fluent, appropriate CN grossly intact MAEW with good strength Wound c/d/i  Disposition: CIR  Discharge Instructions    Call MD for:  difficulty breathing, headache or visual disturbances    Complete by:  As directed    Call MD for:  persistant dizziness or light-headedness    Complete by:  As directed    Call MD for:  redness, tenderness, or signs of infection (pain, swelling, redness, odor or green/yellow discharge around incision site)    Complete by:  As directed    Call MD for:  severe uncontrolled pain    Complete by:  As directed    Call MD for:  temperature >100.4    Complete by:  As directed    Diet general    Complete by:  As  directed    Driving Restrictions    Complete by:  As directed    Do not drive until given clearance.   Increase activity slowly    Complete by:  As directed    Lifting restrictions    Complete by:  As directed    Do not lift anything >10lbs. Avoid bending and twisting in awkward positions. Avoid bending at the back.     Allergies as of 11/24/2016      Reactions   Lisinopril Swelling      Medication List    TAKE these medications   acetaminophen 325 MG tablet Commonly known as:  TYLENOL Take 650 mg by mouth every 6 (six) hours as needed for moderate pain.   amLODipine 10 MG tablet Commonly known as:  NORVASC Take 1 tablet (10 mg total) by mouth daily.   cetirizine 10 MG tablet Commonly known as:  ZYRTEC Take 1 tablet (10 mg total) by mouth daily. What changed:  when to take this  reasons to take this   cyclobenzaprine 5 MG tablet Commonly known as:  FLEXERIL Take 1 tablet (5 mg total) by mouth 3 (three) times daily as needed for muscle spasms.   famotidine 20 MG tablet Commonly known as:  PEPCID Take 1 tablet (20 mg total) by mouth 2 (two) times daily as needed for heartburn or indigestion.   fluticasone 50 MCG/ACT nasal spray Commonly known as:  FLONASE Place 2 sprays into both nostrils daily.   HYDROcodone-acetaminophen 5-325  MG tablet Commonly known as:  NORCO/VICODIN Take 1 tablet by mouth every 4 (four) hours as needed for moderate pain.   meclizine 12.5 MG tablet Commonly known as:  ANTIVERT Take 1 tablet (12.5 mg total) by mouth 3 (three) times daily as needed for dizziness.   methylPREDNISolone 4 MG Tbpk tablet Commonly known as:  MEDROL Take according to package inserts   naproxen sodium 220 MG tablet Commonly known as:  ANAPROX Take 220 mg by mouth 2 (two) times daily as needed (pain).   oxymetazoline 0.05 % nasal spray Commonly known as:  AFRIN NASAL SPRAY Place 1 spray into both nostrils 2 (two) times daily. Use only for 3days, then  stop What changed:  when to take this  reasons to take this  additional instructions   pravastatin 40 MG tablet Commonly known as:  PRAVACHOL Take 1 tablet (40 mg total) by mouth daily. Annual appt is due w/labs must see provider for future refills What changed:  additional instructions   tamoxifen 20 MG tablet Commonly known as:  NOLVADEX Take 1 tablet (20 mg total) by mouth daily.            Discharge Care Instructions        Start     Ordered   11/24/16 0000  HYDROcodone-acetaminophen (NORCO/VICODIN) 5-325 MG tablet  Every 4 hours PRN     11/24/16 0739   11/24/16 0000  methylPREDNISolone (MEDROL) 4 MG TBPK tablet     11/24/16 0739   11/24/16 0000  Increase activity slowly     11/24/16 0739   11/24/16 0000  Driving Restrictions    Comments:  Do not drive until given clearance.   11/24/16 0739   11/24/16 0000  Lifting restrictions    Comments:  Do not lift anything >10lbs. Avoid bending and twisting in awkward positions. Avoid bending at the back.   11/24/16 0739   11/24/16 0000  Diet general     11/24/16 0739   11/24/16 0000  Call MD for:  temperature >100.4     11/24/16 0739   11/24/16 0000  Call MD for:  severe uncontrolled pain     11/24/16 0739   11/24/16 0000  Call MD for:  redness, tenderness, or signs of infection (pain, swelling, redness, odor or green/yellow discharge around incision site)     11/24/16 0739   11/24/16 0000  Call MD for:  difficulty breathing, headache or visual disturbances     11/24/16 0739   11/24/16 0000  Call MD for:  persistant dizziness or light-headedness     11/24/16 0739       Signed: Alyson Ingles 11/24/2016, 7:39 AM

## 2016-11-24 NOTE — Progress Notes (Signed)
11/24/16 1330 nursing To unit rehab per wheelchair accompanied by NT and daughters alert patient s/p crani; Surgical incision site clean dry and intact. Dressing changed. No skin issues noted. Fall prevention sheet signed by daughter and patient understood.

## 2016-11-24 NOTE — Progress Notes (Signed)
Neurosurgery Progress Note  No issues overnight.  Feels well this morning Denies any pain  EXAM:  BP 133/81 (BP Location: Right Arm)   Pulse 81   Temp 99 F (37.2 C) (Oral)   Resp 20   Ht 5\' 4"  (1.626 m)   Wt 75.8 kg (167 lb 3.2 oz)   LMP 08/16/2012   SpO2 99%   BMI 28.70 kg/m   Awake, alert, oriented  Speech fluent, appropriate  CN grossly intact  MAEW with good strength  Plan Progressing nicely. Ok for CIR at this point  Discharge signed

## 2016-11-24 NOTE — Progress Notes (Signed)
[name], [name], MD Physician Signed Physical Medicine and Rehabilitation  Consult Note Date of Service: 11/22/2016 2:34 PM  Related encounter: ED to Hosp-Admission (Discharged) from 11/19/2016 in [facility] Collapse All   [] Hide copied text [] Hover for attribution information      Physical Medicine and Rehabilitation Consult Reason for Consult: Decreased functional mobility Referring Physician: Dr. [name]   HPI: Tasha Ortiz is a 61 y.o. right handed female with history of hypertension, right breast cancer treated with mastectomy subsequent chemotherapy,  tobacco abuse.  Patient lives with daughter. Independent prior to admission and daughter works during the day. Presented 11/19/2016 with progressive vertigo ataxia occasional headaches. MRI obtained showed cerebellar lesion. CT chest abdomen pelvis negative for any metastatic disease. Underwent suboccipital craniectomy for gross total resection of cerebellar tumor 11/21/2016 per Dr. [name]. Postoperative MRI of the brain pending. Decadron protocol as indicated. Postoperative occupational therapy evaluation completed 11/22/2016 with recommendations of physical medicine rehabilitation consult.   Patient is dizzy when she looks around, but does not have dizziness, laying in bed, no nausea or vomiting but does have poor appetite.   Review of Systems  Constitutional: Negative for chills and fever.  HENT: Negative for hearing loss.   Eyes: Negative for blurred vision and double vision.  Respiratory: Negative for cough and shortness of breath.   Cardiovascular: Negative for chest pain, palpitations and leg swelling.  Gastrointestinal: Positive for constipation. Negative for nausea and vomiting.       GERD  Genitourinary: Negative for dysuria, flank pain and hematuria.  Musculoskeletal: Positive for myalgias.  Skin: Negative for rash.  Neurological: Positive for  dizziness, weakness and headaches. Negative for seizures.  All other systems reviewed and are negative.      Past Medical History:  Diagnosis Date  . Acid reflux   . Allergy    seasonal  . Breast cancer ([place])    right,no iv or blood on right sid, chemo and radiation 2014  . Heartburn   . Hypercholesteremia    taken off chol meds Dec 2012  . Hypertension   . Hypertension   . Personal history of chemotherapy 05/2012   rt breast  . Personal history of radiation therapy 10/2012   rt breast        Past Surgical History:  Procedure Laterality Date  . BREAST BIOPSY Right 04/16/2012  . BREAST SURGERY Right 05/20/12   Rt br mastectomy  . CESAREAN SECTION  25 years ago  . colonoscopy    . COLONOSCOPY WITH PROPOFOL N/A 01/18/2015   Procedure: COLONOSCOPY WITH PROPOFOL;  Surgeon: Mauri Pole, MD;  Location: [facility];  Service: Endoscopy;  Laterality: N/A;  . MASTECTOMY Right 05/2012  . MODIFIED MASTECTOMY Right 05/20/2012   Procedure: MODIFIED MASTECTOMY;  Surgeon: Edward Jolly, MD;  Location: [facility];  Service: General;  Laterality: Right;  . PORTACATH PLACEMENT Left 05/20/2012   Procedure: INSERTION PORT-A-CATH;  Surgeon: Edward Jolly, MD;  Location: [facility];  Service: General;  Laterality: Left;  . SUBOCCIPITAL CRANIECTOMY CERVICAL LAMINECTOMY N/A 11/21/2016   Procedure: SUBOCCIPITAL CRANIECTOMY RESECTION TUMOR;  Surgeon: Newman Pies, MD;  Location: [facility];  Service: Neurosurgery;  Laterality: N/A;  . TUBAL LIGATION          Family History  Problem Relation Age of Onset  . Hypertension Mother   . Cancer Sister        brain  . Cancer Cousin  breast  . Healthy Father   . Healthy Maternal Grandmother   . Healthy Maternal Grandfather   . Healthy Paternal Grandmother   . Healthy Paternal Grandfather   . Diabetes Daughter   . Colon cancer Neg Hx    Social History:  reports that she has never smoked. She quit  smokeless tobacco use about 20 years ago. Her smokeless tobacco use included Chew. She reports that she does not drink alcohol or use drugs. Allergies:      Allergies  Allergen Reactions  . Lisinopril Swelling         Medications Prior to Admission  Medication Sig Dispense Refill  . acetaminophen (TYLENOL) 325 MG tablet Take 650 mg by mouth every 6 (six) hours as needed for moderate pain.    Marland Kitchen amLODipine (NORVASC) 10 MG tablet Take 1 tablet (10 mg total) by mouth daily. 90 tablet 3  . cetirizine (ZYRTEC) 10 MG tablet Take 1 tablet (10 mg total) by mouth daily. (Patient taking differently: Take 10 mg by mouth daily as needed. ) 30 tablet 11  . cyclobenzaprine (FLEXERIL) 5 MG tablet Take 1 tablet (5 mg total) by mouth 3 (three) times daily as needed for muscle spasms. 30 tablet 1  . fluticasone (FLONASE) 50 MCG/ACT nasal spray Place 2 sprays into both nostrils daily. 16 g 3  . meclizine (ANTIVERT) 12.5 MG tablet Take 1 tablet (12.5 mg total) by mouth 3 (three) times daily as needed for dizziness. 30 tablet 1  . naproxen sodium (ANAPROX) 220 MG tablet Take 220 mg by mouth 2 (two) times daily as needed (pain).    . tamoxifen (NOLVADEX) 20 MG tablet Take 1 tablet (20 mg total) by mouth daily. 90 tablet 3  . famotidine (PEPCID) 20 MG tablet Take 1 tablet (20 mg total) by mouth 2 (two) times daily as needed for heartburn or indigestion. (Patient not taking: Reported on 11/19/2016) 180 tablet 3  . oxymetazoline (AFRIN NASAL SPRAY) 0.05 % nasal spray Place 1 spray into both nostrils 2 (two) times daily. Use only for 3days, then stop (Patient taking differently: Place 1 spray into both nostrils 2 (two) times daily as needed for congestion. Use only for 3days, then stop) 30 mL 0  . pravastatin (PRAVACHOL) 40 MG tablet Take 1 tablet (40 mg total) by mouth daily. Annual appt is due w/labs must see provider for future refills 30 tablet 0    Home: Home Living Family/patient expects to be discharged  to:: Private residence Living Arrangements: Children Available Help at Discharge: Family, Available PRN/intermittently Type of Home: Apartment Home Access: Stairs to enter Technical brewer of Steps: 13 Entrance Stairs-Rails: Right Home Layout: One level Bathroom Shower/Tub: Chiropodist: Standard Home Equipment: Grab bars - tub/shower  Functional History: Prior Function Level of Independence: Independent Comments: ADls, IADLs, working Functional Status:  Mobility: Bed Mobility Overal bed mobility: Needs Assistance Bed Mobility: Supine to Sit Supine to sit: Min assist General bed mobility comments: A due to being unsteady and significant R bias Transfers Overall transfer level: Needs assistance Equipment used: None Transfers: Sit to/from Stand Sit to Stand: Mod assist General transfer comment: poorly controlled sit - stand Ambulation/Gait Ambulation/Gait assistance: Min guard, Supervision Ambulation Distance (Feet): 250 Feet Assistive device: None Gait Pattern/deviations: Step-through pattern, Decreased stride length, Drifts right/left General Gait Details: Slow, overall steady gait. Able to speed up on command, however, prefers to walk at slower space. Performed dynamic balance tasks during ambulation (see DGI below). Slight imbalance noted with  horizontal/vertical head turns, and required min guard for steadying assist. Will reasses balance following surgery  Gait velocity: Decreased Gait velocity interpretation: Below normal speed for age/gender  ADL: ADL Overall ADL's : Needs assistance/impaired Eating/Feeding: Set up, Sitting Grooming: Min guard, Sitting (pt required A at times for midline orientation due to R bias) Upper Body Bathing: Minimal assistance (limited by pain) Lower Body Bathing: Minimal assistance, Sit to/from stand Upper Body Dressing : Minimal assistance Lower Body Dressing: Minimal assistance, Sit to/from stand Lower Body  Dressing Details (indicate cue type and reason): Able to reach down and adjust socks Toilet Transfer: Minimal assistance, Stand-pivot Toileting- Clothing Manipulation and Hygiene: Minimal assistance Functional mobility during ADLs: Supervision/safety General ADL Comments: Supervision overall despite coordination deficits.   Cognition: Cognition Overall Cognitive Status: No family/caregiver present to determine baseline cognitive functioning Orientation Level: Oriented to person, Oriented to place, Oriented to situation, Disoriented to time Cognition Arousal/Alertness: Awake/alert Behavior During Therapy: WFL for tasks assessed/performed Overall Cognitive Status: No family/caregiver present to determine baseline cognitive functioning General Comments: Decreaesd awareness of deficits  Blood pressure 140/70, pulse 82, temperature 98.2 F (36.8 C), temperature source Oral, resp. rate 16, height 5\' 4"  (1.626 m), weight 77.6 kg (171 lb), last menstrual period 08/16/2012, SpO2 97 %. Physical Exam  Constitutional: She appears well-developed.  Eyes:  Pupils round and reactive to light however she chooses to keep her right eye closed  Neck: Normal range of motion. Neck supple. No thyromegaly present.  Cardiovascular: Normal rate, regular rhythm and normal heart sounds.   Respiratory: Effort normal and breath sounds normal. No respiratory distress.  GI: Soft. Bowel sounds are normal. She exhibits no distension.  Neurological: She is alert.  Oriented to person place and date of birth. Follows simple commands. Mood is flat but appropriate. She makes good eye contact with examiner.  Skin:  Craniotomy site clean and dry  Moderate dysmetria, right finger-nose-finger, mild dysmetria, right heel-to-shin. No evidence of dysmetria, left finger-nose to finger or left shoulder shin. Motor strength is 5/5 bilateral deltoid, biceps, triceps, grip, hip flexor, knee extensor, ankle dorsal flexor. Sensation  intact to light touch bilateral upper and lower limb as well as facial region. Visual fields are intact, she does have evidence of nystagmus, mainly toward the right greater than total left horizontal.  Lab Results Last 24 Hours       Results for orders placed or performed during the hospital encounter of 11/19/16 (from the past 24 hour(s))  Prepare RBC     Status: None   Collection Time: 11/21/16  4:30 PM  Result Value Ref Range   Order Confirmation ORDER PROCESSED BY BLOOD BANK   Glucose, capillary     Status: Abnormal   Collection Time: 11/22/16  8:20 AM  Result Value Ref Range   Glucose-Capillary 252 (H) 65 - 99 mg/dL   Comment 1 Notify RN   Glucose, capillary     Status: Abnormal   Collection Time: 11/22/16 11:41 AM  Result Value Ref Range   Glucose-Capillary 368 (H) 65 - 99 mg/dL   Comment 1 Notify RN   Hemoglobin A1c     Status: Abnormal   Collection Time: 11/22/16 12:42 PM  Result Value Ref Range   Hgb A1c MFr Bld 6.3 (H) 4.8 - 5.6 %   Mean Plasma Glucose 134.11 mg/dL      Imaging Results (Last 48 hours)  Dg Chest Port 1 View  Result Date: 11/21/2016 CLINICAL DATA:  Central line placement EXAM: PORTABLE CHEST  1 VIEW COMPARISON:  03/28/2013 CXR and 11/19/2016 CT FINDINGS: The heart size and mediastinal contours are within normal limits. Aortic atherosclerosis at the arch without aneurysm. Right IJ central line catheter tip is seen at the cavoatrial junction. No pneumothorax. Port catheter tip is seen in the proximal to mid SVC. Both lungs are clear. The visualized skeletal structures are unremarkable. Right axillary lymph node dissection clips are noted. IMPRESSION: 1. Central venous line catheter tip at the cavoatrial junction without pneumothorax. 2. Clear lungs. 3. Aortic atherosclerosis. Electronically Signed   By: Ashley Royalty M.D.   On: 11/21/2016 17:56     Assessment/Plan: Diagnosis: Right cerebellar mass, probably metastatic, with history of breast  cancer, status post suboccipital craniotomy and resection with residual right sided dysmetria and gait disorder 1. Does the need for close, 24 hr/day medical supervision in concert with the patient's rehab needs make it unreasonable for this patient to be served in a less intensive setting? Yes 2. Co-Morbidities requiring supervision/potential complications: Diabetes mellitus, poorly controlled, postoperative pain 3. Due to bladder management, bowel management, safety, skin/wound care, disease management, medication administration, pain management and patient education, does the patient require 24 hr/day rehab nursing? Yes 4. Does the patient require coordinated care of a physician, rehab nurse, PT (1-2 hrs/day, 5 days/week) and OT (1-2 hrs/day, 5 days/week) to address physical and functional deficits in the context of the above medical diagnosis(es)? Yes Addressing deficits in the following areas: balance, endurance, locomotion, strength, transferring, bowel/bladder control, bathing, dressing, feeding, grooming, toileting and psychosocial support 5. Can the patient actively participate in an intensive therapy program of at least 3 hrs of therapy per day at least 5 days per week? Yes 6. The potential for patient to make measurable gains while on inpatient rehab is excellent 7. Anticipated functional outcomes upon discharge from inpatient rehab are modified independent and supervision  with PT, modified independent and supervision with OT, n/a with SLP. 8. Estimated rehab length of stay to reach the above functional goals is: 7-10d 9. Anticipated D/C setting: Home 10. Anticipated post D/C treatments: [place] therapy 11. Overall Rehab/Functional Prognosis: excellent  RECOMMENDATIONS: This patient's condition is appropriate for continued rehabilitative care in the following setting: CIR Patient has agreed to participate in recommended program. Yes Note that insurance prior authorization may be required for  reimbursement for recommended care.  Comment:   Charlett Blake M.D. [facility] FAAPM&R (Sports Med, Neuromuscular Med) Diplomate Am Board of Electrodiagnostic Med  Cathlyn Parsons., PA-C 11/22/2016    Revision History                        Routing History

## 2016-11-25 ENCOUNTER — Inpatient Hospital Stay (HOSPITAL_COMMUNITY): Payer: BLUE CROSS/BLUE SHIELD | Admitting: Occupational Therapy

## 2016-11-25 ENCOUNTER — Inpatient Hospital Stay (HOSPITAL_COMMUNITY): Payer: BLUE CROSS/BLUE SHIELD

## 2016-11-25 DIAGNOSIS — C50919 Malignant neoplasm of unspecified site of unspecified female breast: Secondary | ICD-10-CM

## 2016-11-25 DIAGNOSIS — C7931 Secondary malignant neoplasm of brain: Secondary | ICD-10-CM

## 2016-11-25 DIAGNOSIS — D496 Neoplasm of unspecified behavior of brain: Secondary | ICD-10-CM

## 2016-11-25 LAB — GLUCOSE, CAPILLARY
Glucose-Capillary: 130 mg/dL — ABNORMAL HIGH (ref 65–99)
Glucose-Capillary: 133 mg/dL — ABNORMAL HIGH (ref 65–99)
Glucose-Capillary: 139 mg/dL — ABNORMAL HIGH (ref 65–99)
Glucose-Capillary: 141 mg/dL — ABNORMAL HIGH (ref 65–99)
Glucose-Capillary: 146 mg/dL — ABNORMAL HIGH (ref 65–99)
Glucose-Capillary: 204 mg/dL — ABNORMAL HIGH (ref 65–99)

## 2016-11-25 MED ORDER — INSULIN ASPART 100 UNIT/ML ~~LOC~~ SOLN
0.0000 [IU] | Freq: Every day | SUBCUTANEOUS | Status: DC
  Administered 2016-11-25: 2 [IU] via SUBCUTANEOUS
  Administered 2016-11-26: 3 [IU] via SUBCUTANEOUS
  Administered 2016-11-27: 2 [IU] via SUBCUTANEOUS
  Administered 2016-11-28: 3 [IU] via SUBCUTANEOUS
  Administered 2016-11-29: 2 [IU] via SUBCUTANEOUS
  Administered 2016-11-30: 3 [IU] via SUBCUTANEOUS
  Administered 2016-12-01: 2 [IU] via SUBCUTANEOUS

## 2016-11-25 MED ORDER — INSULIN ASPART 100 UNIT/ML ~~LOC~~ SOLN
0.0000 [IU] | Freq: Three times a day (TID) | SUBCUTANEOUS | Status: DC
  Administered 2016-11-25 (×2): 3 [IU] via SUBCUTANEOUS
  Administered 2016-11-26 (×2): 4 [IU] via SUBCUTANEOUS
  Administered 2016-11-26: 3 [IU] via SUBCUTANEOUS
  Administered 2016-11-27: 7 [IU] via SUBCUTANEOUS
  Administered 2016-11-27 (×2): 4 [IU] via SUBCUTANEOUS
  Administered 2016-11-28: 7 [IU] via SUBCUTANEOUS
  Administered 2016-11-28: 4 [IU] via SUBCUTANEOUS
  Administered 2016-11-28: 7 [IU] via SUBCUTANEOUS
  Administered 2016-11-29: 3 [IU] via SUBCUTANEOUS
  Administered 2016-11-29 – 2016-11-30 (×4): 4 [IU] via SUBCUTANEOUS
  Administered 2016-11-30: 7 [IU] via SUBCUTANEOUS
  Administered 2016-12-01 (×2): 4 [IU] via SUBCUTANEOUS
  Administered 2016-12-02: 3 [IU] via SUBCUTANEOUS
  Administered 2016-12-02: 7 [IU] via SUBCUTANEOUS
  Administered 2016-12-02: 4 [IU] via SUBCUTANEOUS
  Administered 2016-12-03: 3 [IU] via SUBCUTANEOUS
  Administered 2016-12-03 (×2): 4 [IU] via SUBCUTANEOUS
  Administered 2016-12-04: 7 [IU] via SUBCUTANEOUS
  Administered 2016-12-04: 4 [IU] via SUBCUTANEOUS
  Administered 2016-12-04: 3 [IU] via SUBCUTANEOUS
  Administered 2016-12-05: 4 [IU] via SUBCUTANEOUS
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
## 2016-11-25 NOTE — Progress Notes (Signed)
SUBJ: no complaints, feels well. She is eating  OBJ: BP 111/73 (BP Location: Left Arm)   Pulse 80   Temp 99.5 F (37.5 C) (Oral)   Resp 17   Wt 167 lb 1.7 oz (75.8 kg)   LMP 08/16/2012   SpO2 99%   BMI 28.68 kg/m   NAD<  chest CTA HEENT, AT, [place]- supple without JVD ABD- overweight, + BS CV- reg rate  A/P: Medical Problem List and Plan: 1.  Right side weakness with gait disorder secondary to right cerebellar mass status post craniotomy 11/21/2016 2.  DVT Prophylaxis/Anticoagulation: SCDs. Monitor for any signs of DVT 3. Pain Management: Hydrocodone as needed 4. Mood: Provide emotional support 5. Neuropsych: This patient is capable of making decisions on her own behalf. 6. Skin/Wound Care: Routine skin checks 7. Fluids/Electrolytes/Nutrition: Routine I&O with follow-up chemistries 8. Hypertension. Norvasc 10 mg daily 9. History of right breast cancer with mastectomy as well as chemotherapy. Continue tamoxifen daily 10. Hyperlipidemia. Pravachol 11. MRSA PCR screening positive. Contact precautions 12. Metastatic Breast CA- new diagnosis with mets to brain- will need oncology consult (have sent note to dr. Lindi Adie)

## 2016-11-26 ENCOUNTER — Inpatient Hospital Stay (HOSPITAL_COMMUNITY): Payer: BLUE CROSS/BLUE SHIELD | Admitting: Physical Therapy

## 2016-11-26 ENCOUNTER — Inpatient Hospital Stay (HOSPITAL_COMMUNITY): Payer: BLUE CROSS/BLUE SHIELD | Admitting: Occupational Therapy

## 2016-11-26 ENCOUNTER — Inpatient Hospital Stay (HOSPITAL_COMMUNITY): Payer: BLUE CROSS/BLUE SHIELD

## 2016-11-26 DIAGNOSIS — I10 Essential (primary) hypertension: Secondary | ICD-10-CM

## 2016-11-26 LAB — COMPREHENSIVE METABOLIC PANEL
ALT: 16 U/L (ref 14–54)
AST: 18 U/L (ref 15–41)
Albumin: 2.9 g/dL — ABNORMAL LOW (ref 3.5–5.0)
Alkaline Phosphatase: 38 U/L (ref 38–126)
Anion gap: 8 (ref 5–15)
BUN: 17 mg/dL (ref 6–20)
CO2: 27 mmol/L (ref 22–32)
Calcium: 9.2 mg/dL (ref 8.9–10.3)
Chloride: 104 mmol/L (ref 101–111)
Creatinine, Ser: 0.83 mg/dL (ref 0.44–1.00)
GFR calc Af Amer: >60 mL/min (ref >60)
GFR calc non Af Amer: >60 mL/min (ref >60)
Glucose, Bld: 169 mg/dL — ABNORMAL HIGH (ref 65–99)
Potassium: 3.8 mmol/L (ref 3.5–5.1)
Sodium: 139 mmol/L (ref 135–145)
Total Bilirubin: 0.6 mg/dL (ref 0.3–1.2)
Total Protein: 6.3 g/dL — ABNORMAL LOW (ref 6.5–8.1)

## 2016-11-26 LAB — CBC WITH DIFFERENTIAL/PLATELET
Basophils Absolute: 0 10*3/uL (ref 0.0–0.1)
Basophils Relative: 0 %
Eosinophils Absolute: 0 10*3/uL (ref 0.0–0.7)
Eosinophils Relative: 0 %
HCT: 39 % (ref 36.0–46.0)
Hemoglobin: 12.9 g/dL (ref 12.0–15.0)
Lymphocytes Relative: 24 %
Lymphs Abs: 2.2 10*3/uL (ref 0.7–4.0)
MCH: 29.1 pg (ref 26.0–34.0)
MCHC: 33.1 g/dL (ref 30.0–36.0)
MCV: 88 fL (ref 78.0–100.0)
Monocytes Absolute: 0.8 10*3/uL (ref 0.1–1.0)
Monocytes Relative: 8 %
Neutro Abs: 6.2 10*3/uL (ref 1.7–7.7)
Neutrophils Relative %: 68 %
Platelets: 218 10*3/uL (ref 150–400)
RBC: 4.43 MIL/uL (ref 3.87–5.11)
RDW: 12.8 % (ref 11.5–15.5)
WBC: 9.2 10*3/uL (ref 4.0–10.5)

## 2016-11-26 LAB — GLUCOSE, CAPILLARY
Glucose-Capillary: 148 mg/dL — ABNORMAL HIGH (ref 65–99)
Glucose-Capillary: 197 mg/dL — ABNORMAL HIGH (ref 65–99)
Glucose-Capillary: 167 mg/dL — ABNORMAL HIGH (ref 65–99)
Glucose-Capillary: 295 mg/dL — ABNORMAL HIGH (ref 65–99)

## 2016-11-26 NOTE — Progress Notes (Signed)
Occupational Therapy Session Note  Patient Details  Name: Tasha Ortiz MRN: [id number] Date of Birth: [birth year]  Today's Date: 11/26/2016 OT Individual Time: 1500-1530 OT Individual Time Calculation (min): 30 min  and Today's Date: 11/26/2016 OT Missed Time: 30 Minutes Missed Time Reason: Patient fatigue   Short Term Goals: Week 1:  OT Short Term Goal 1 (Week 1): Pt will complete toilet transfers with Min A OT Short Term Goal 2 (Week 1): Pt will complete sit<stand during dressing with Min A and LRAD OT Short Term Goal 3 (Week 1): Pt will complete 2 grooming tasks in standing to improve balance   Skilled Therapeutic Interventions/Progress Updates:   Upon entering the room, pt sleeping soundly in room. Pt declined OT intervention initially secondary to fatigue from prior therapy session. OT and pt continued to discuss discharge plans and home recommendations. OT also providing education regarding energy conservation for self care tasks. Pt having difficulty remaining awake and continued to declined OOB tasks this session. Pt left to rest in bed with call bell and all needed items within reach. Bed alarm activated.   Therapy Documentation Precautions:  Precautions Precautions: Fall Precaution Comments: Ataxic Restrictions Weight Bearing Restrictions: No General: General OT Amount of Missed Time: 30 Minutes Vital Signs: Therapy Vitals Temp: 99.4 F (37.4 C) Temp Source: Oral Pulse Rate: 86 Resp: 18 BP: 110/66 Patient Position (if appropriate): Sitting Pain:   ADL: ADL ADL Comments: Please see functional navigator for ADL status Vision   Perception    Praxis   Exercises:   Other Treatments:    See Function Navigator for Current Functional Status.   Therapy/Group: Individual Therapy  Gypsy Decant 11/26/2016, 3:56 PM

## 2016-11-26 NOTE — Progress Notes (Signed)
Physical Therapy Session Note  Patient Details  Name: Tasha Ortiz MRN: [id number] Date of Birth: 1955/05/01  Today's Date: 11/26/2016 PT Individual Time: 5638-9373 PT Individual Time Calculation (min): 69 min   Short Term Goals: Week 1:  PT Short Term Goal 1 (Week 1): STG= LTG due to short LOS  Skilled Therapeutic Interventions/Progress Updates:  Pt received in bed & agreeable to tx, denying c/o pain. Pt transfers supine>sitting EOB with mod I & hospital bed features. Pt completes stand pivot bed>w/c with RW & steady assist. Transported pt to gym via w/c total assist for time management and energy conservation. Gait x 120 ft with RW & steady assist. Pt performed standing cone taps with mod assist with 1 UE then no UE support with task focusing on coordination and balance. Pt with impaired coordination RLE>LLE. Pt negotiated 4 steps + 4 steps with R rail and steady assist with pt self selecting gait pattern; pt requires rest breaks during each trial 2/2 fatigue. Pt utilized cybex kinetron in sitting then standing with BUE support with task focusing on weight shifting, NMR, and balance. At end of session pt returned to bed via squat pivot with supervision. Pt left in bed with alarm set & all needs within reach. Educated pt on recommendation for 24 hr supervision upon d/c with her reporting her daughters and sister (from [place]) plan to assist with this; also educated pt on recovery following surgery.   Pt reports blurry vision when looking down but reports it clears up when looking straight ahead.   Therapy Documentation Precautions:  Precautions Precautions: Fall Precaution Comments: Ataxic Restrictions Weight Bearing Restrictions: No   See Function Navigator for Current Functional Status.   Therapy/Group: Individual Therapy  Waunita Schooner 11/26/2016, 3:00 PM

## 2016-11-26 NOTE — Care Management Note (Signed)
[facility] Individual Statement of Services  Patient Name:  Tasha Ortiz  Date:  11/26/2016  Welcome to the [facility].  Our goal is to provide you with an individualized program based on your diagnosis and situation, designed to meet your specific needs.  With this comprehensive rehabilitation program, you will be expected to participate in at least 3 hours of rehabilitation therapies Monday-Friday, with modified therapy programming on the weekends.  Your rehabilitation program will include the following services:  Physical Therapy (PT), Occupational Therapy (OT), Speech Therapy (ST), 24 hour per day rehabilitation nursing, Therapeutic Recreaction (TR), Neuropsychology, Case Management (Social Worker), Rehabilitation Medicine, Nutrition Services and Pharmacy Services  Weekly team conferences will be held on Tuesday to discuss your progress.  Your Social Worker will talk with you frequently to get your input and to update you on team discussions.  Team conferences with you and your family in attendance may also be held.  Expected length of stay: 7-10 days  Overall anticipated outcome: supervision with set up  Depending on your progress and recovery, your program may change. Your Social Worker will coordinate services and will keep you informed of any changes. Your Social Worker's name and contact numbers are listed  below.  The following services may also be recommended but are not provided by the [facility] will be made to provide these services after discharge if needed.  Arrangements include referral to agencies that provide these services.  Your insurance has been verified to be:  [place] Your primary doctor is:  Terri Piedra  Pertinent information will be shared with your doctor and  your insurance company.  Social Worker:  Ovidio Kin, [place] or ([phone]  Information discussed with and copy given to patient by: Elease Hashimoto, 11/26/2016, 10:55 AM

## 2016-11-26 NOTE — H&P (Signed)
Physical Medicine and Rehabilitation Admission H&P       Chief Complaint  Patient presents with  . Nasal Congestion  . Dizziness  : HPI: Tasha Tello Mitchellis a 61 y.o.right handed femalewith history of hypertension, right breast cancer treated with mastectomy subsequent chemotherapyAnd maintained on tamoxifen, tobacco abuse. Patient lives with daughter. Independent prior to admissionand daughter works during the day. Presented 11/19/2016 with progressive vertigo ataxia occasional headaches. MRI obtained showed cerebellar lesion. CT chest abdomen pelvis negative for any metastatic disease. Underwent suboccipital craniectomy for gross total resection of cerebellar tumor 11/21/2016 per Dr. [name]. Postoperative MRI of the brain Showed postoperative changes after resection minimal residual tumor as well as moderate sized acute infarct involving the inferior and medial cerebellar hemispheres. Decadron protocol as indicated. MRSA PCR screen positive maintained on contact precautions. Postoperative Physical and occupational therapy evaluations completed 11/22/2016 with recommendations of physical medicine rehabilitation consult. Patient was admitted for comprehensive rehabilitation program  Review of Systems  Constitutional: Negative for chills and fever.  HENT: Negative for hearing loss and tinnitus.   Eyes: Negative for blurred vision and double vision.  Cardiovascular: Negative for chest pain, palpitations and leg swelling.  Gastrointestinal: Positive for constipation. Negative for nausea and vomiting.  Genitourinary: Negative for dysuria, hematuria and urgency.  Skin: Negative for rash.  Neurological: Positive for dizziness, weakness and headaches. Negative for seizures.  All other systems reviewed and are negative.  Reviewed with patient 8/25, no changes other than constipation improved as are headaches      Past Medical History:  Diagnosis Date  . Acid reflux   .  Allergy    seasonal  . Breast cancer ([place])    right,no iv or blood on right sid, chemo and radiation 2014  . Heartburn   . Hypercholesteremia    taken off chol meds Dec 2012  . Hypertension   . Hypertension   . Personal history of chemotherapy 05/2012   rt breast  . Personal history of radiation therapy 10/2012   rt breast        Past Surgical History:  Procedure Laterality Date  . BREAST BIOPSY Right 04/16/2012  . BREAST SURGERY Right 05/20/12   Rt br mastectomy  . CESAREAN SECTION  25 years ago  . colonoscopy    . COLONOSCOPY WITH PROPOFOL N/A 01/18/2015   Procedure: COLONOSCOPY WITH PROPOFOL;  Surgeon: Mauri Pole, MD;  Location: [facility];  Service: Endoscopy;  Laterality: N/A;  . MASTECTOMY Right 05/2012  . MODIFIED MASTECTOMY Right 05/20/2012   Procedure: MODIFIED MASTECTOMY;  Surgeon: Edward Jolly, MD;  Location: [place];  Service: General;  Laterality: Right;  . PORTACATH PLACEMENT Left 05/20/2012   Procedure: INSERTION PORT-A-CATH;  Surgeon: Edward Jolly, MD;  Location: [place];  Service: General;  Laterality: Left;  . SUBOCCIPITAL CRANIECTOMY CERVICAL LAMINECTOMY N/A 11/21/2016   Procedure: SUBOCCIPITAL CRANIECTOMY RESECTION TUMOR;  Surgeon: Newman Pies, MD;  Location: [facility];  Service: Neurosurgery;  Laterality: N/A;  . TUBAL LIGATION          Family History  Problem Relation Age of Onset  . Hypertension Mother   . Cancer Sister        brain  . Cancer Cousin        breast  . Healthy Father   . Healthy Maternal Grandmother   . Healthy Maternal Grandfather   . Healthy Paternal Grandmother   . Healthy Paternal Grandfather   . Diabetes Daughter   . Colon cancer Neg  Hx    Social History:  reports that she has never smoked. She quit smokeless tobacco use about 20 years ago. Her smokeless tobacco use included Chew. She reports that she does not drink alcohol or use drugs. Allergies:      Allergies    Allergen Reactions  . Lisinopril Swelling         Medications Prior to Admission  Medication Sig Dispense Refill  . acetaminophen (TYLENOL) 325 MG tablet Take 650 mg by mouth every 6 (six) hours as needed for moderate pain.    Marland Kitchen amLODipine (NORVASC) 10 MG tablet Take 1 tablet (10 mg total) by mouth daily. 90 tablet 3  . cetirizine (ZYRTEC) 10 MG tablet Take 1 tablet (10 mg total) by mouth daily. (Patient taking differently: Take 10 mg by mouth daily as needed. ) 30 tablet 11  . cyclobenzaprine (FLEXERIL) 5 MG tablet Take 1 tablet (5 mg total) by mouth 3 (three) times daily as needed for muscle spasms. 30 tablet 1  . fluticasone (FLONASE) 50 MCG/ACT nasal spray Place 2 sprays into both nostrils daily. 16 g 3  . meclizine (ANTIVERT) 12.5 MG tablet Take 1 tablet (12.5 mg total) by mouth 3 (three) times daily as needed for dizziness. 30 tablet 1  . naproxen sodium (ANAPROX) 220 MG tablet Take 220 mg by mouth 2 (two) times daily as needed (pain).    . tamoxifen (NOLVADEX) 20 MG tablet Take 1 tablet (20 mg total) by mouth daily. 90 tablet 3  . famotidine (PEPCID) 20 MG tablet Take 1 tablet (20 mg total) by mouth 2 (two) times daily as needed for heartburn or indigestion. (Patient not taking: Reported on 11/19/2016) 180 tablet 3  . oxymetazoline (AFRIN NASAL SPRAY) 0.05 % nasal spray Place 1 spray into both nostrils 2 (two) times daily. Use only for 3days, then stop (Patient taking differently: Place 1 spray into both nostrils 2 (two) times daily as needed for congestion. Use only for 3days, then stop) 30 mL 0  . pravastatin (PRAVACHOL) 40 MG tablet Take 1 tablet (40 mg total) by mouth daily. Annual appt is due w/labs must see provider for future refills 30 tablet 0    Home: Home Living Family/patient expects to be discharged to:: Private residence Living Arrangements: Children Available Help at Discharge: Family, Available PRN/intermittently Type of Home: Apartment Home Access: Stairs to  enter Technical brewer of Steps: 13 Entrance Stairs-Rails: Right Home Layout: One level Bathroom Shower/Tub: Chiropodist: Standard Home Equipment: Grab bars - tub/shower   Functional History: Prior Function Level of Independence: Independent Comments: ADls, IADLs, working  Functional Status:  Mobility: Bed Mobility Overal bed mobility: Needs Assistance Bed Mobility: Supine to Sit Supine to sit: Min assist General bed mobility comments: A due to being unsteady and significant R bias Transfers Overall transfer level: Needs assistance Equipment used: None Transfers: Sit to/from Stand Sit to Stand: Mod assist General transfer comment: poorly controlled sit - stand with significant anterior lean.  Ambulation/Gait Ambulation/Gait assistance: Min assist, +2 physical assistance, +2 safety/equipment Ambulation Distance (Feet): 7 Feet Assistive device: Rolling walker (2 wheeled) Gait Pattern/deviations: Step-through pattern, Decreased stride length, Drifts right/left General Gait Details: Slow and unsteady gait. Noted significant R lateral lean with standing mobility and with 3 LOB. Pt keeping L eye closed throughout gait training 2 reported dizziness. States there is no double vision but she feels better with one eye closed.  Gait velocity: Decreased Gait velocity interpretation: Below normal speed for age/gender  ADL:  ADL Overall ADL's : Needs assistance/impaired Eating/Feeding: Set up, Sitting Grooming: Min guard, Sitting (pt required A at times for midline orientation due to R bias) Upper Body Bathing: Minimal assistance (limited by pain) Lower Body Bathing: Minimal assistance, Sit to/from stand Upper Body Dressing : Minimal assistance Lower Body Dressing: Minimal assistance, Sit to/from stand Lower Body Dressing Details (indicate cue type and reason): Able to reach down and adjust socks Toilet Transfer: Minimal assistance, Stand-pivot Toileting-  Clothing Manipulation and Hygiene: Minimal assistance Functional mobility during ADLs: Supervision/safety General ADL Comments: Supervision overall despite coordination deficits.   Cognition: Cognition Overall Cognitive Status: Impaired/Different from baseline Orientation Level: Oriented to person, Oriented to place, Oriented to situation, Disoriented to time Cognition Arousal/Alertness: Awake/alert Behavior During Therapy: WFL for tasks assessed/performed Overall Cognitive Status: Impaired/Different from baseline Area of Impairment: Attention, Memory, Following commands, Safety/judgement, Problem solving Current Attention Level: Selective Memory: Decreased short-term memory, Decreased recall of precautions Following Commands: Follows one step commands consistently, Follows one step commands with increased time Safety/Judgement: Decreased awareness of safety, Decreased awareness of deficits Problem Solving: Slow processing, Decreased initiation, Difficulty sequencing, Requires verbal cues General Comments: Decreaesd awareness of deficits  Physical Exam: Blood pressure 136/86, pulse 81, temperature 98.6 F (37 C), temperature source Oral, resp. rate 17, height 5\' 4"  (1.626 m), weight 77.6 kg (171 lb), last menstrual period 08/16/2012, SpO2 97 %. Physical Exam  Vitals reviewed. Eyes: EOM are normal.  Pupils reactive to light  Neck: Normal range of motion. Neck supple. No thyromegaly present.  Cardiovascular: Normal rate, regular rhythm and normal heart sounds.   Respiratory: Effort normal and breath sounds normal. No respiratory distress.  GI: Soft. Bowel sounds are normal. She exhibits no distension.  Skin Craniotomy site clean and dry Neurological: She is alert.  Oriented to person place and date of birth. Follows simple commands. Mood is flat but appropriate. She makes good eye contact with examiner Moderate dysmetria, right finger-nose-finger, mild dysmetria, right  heel-to-shin. No evidence of dysmetria, left finger-nose to finger or left shoulder shin. Motor strength is 5/5 bilateral deltoid, biceps, triceps, grip, hip flexor, knee extensor, ankle dorsal flexor. Sensation intact to light touch bilateral upper and lower limb as well as facial region. Visual fields are intact, she does have evidence of nystagmus, mainly toward the right greater than total left horizontal  bhs exam on 8/25- nad Chest cta- cv- reg rate abd- soft, nt Ext- no edema     Lab Results Last 48 Hours        Results for orders placed or performed during the hospital encounter of 11/19/16 (from the past 48 hour(s))  Type and screen All Cardiac and thoracic surgeries, spinal fusions, myomectomies, craniotomies, colon & liver resections, total joint revisions, same day c-section with placenta previa or accreta.     Status: None (Preliminary result)   Collection Time: 11/21/16 10:00 AM  Result Value Ref Range   ABO/RH(D) B POS    Antibody Screen NEG    Sample Expiration 11/24/2016    Unit Number [id number]    Blood Component Type RED CELLS,LR    Unit division 00    Status of Unit REL FROM [facility]    Transfusion Status OK TO TRANSFUSE    Crossmatch Result Compatible    Unit Number [id number]    Blood Component Type RED CELLS,LR    Unit division 00    Status of Unit REL FROM [facility]    Transfusion Status OK TO TRANSFUSE    Crossmatch Result  Compatible    Unit Number [id number]    Blood Component Type RED CELLS,LR    Unit division 00    Status of Unit ALLOCATED    Transfusion Status OK TO TRANSFUSE    Crossmatch Result Compatible    Unit Number [id number]    Blood Component Type RED CELLS,LR    Unit division 00    Status of Unit ALLOCATED    Transfusion Status OK TO TRANSFUSE    Crossmatch Result Compatible    Unit Number [id number]    Blood Component Type RED CELLS,LR    Unit division 00     Status of Unit REL FROM [facility]    Transfusion Status OK TO TRANSFUSE    Crossmatch Result Compatible    Unit Number [id number]    Blood Component Type RED CELLS,LR    Unit division 00    Status of Unit REL FROM [facility]    Transfusion Status OK TO TRANSFUSE    Crossmatch Result Compatible    Unit Number [id number]    Blood Component Type RED CELLS,LR    Unit division 00    Status of Unit REL FROM [facility]    Transfusion Status OK TO TRANSFUSE    Crossmatch Result Compatible    Unit Number [id number]    Blood Component Type RED CELLS,LR    Unit division 00    Status of Unit REL FROM [facility]    Transfusion Status OK TO TRANSFUSE    Crossmatch Result Compatible   ABO/Rh     Status: None   Collection Time: 11/21/16 10:00 AM  Result Value Ref Range   ABO/RH(D) B POS   Prepare RBC (crossmatch)     Status: None   Collection Time: 11/21/16 10:00 AM  Result Value Ref Range   Order Confirmation ORDER PROCESSED BY BLOOD BANK   Prepare RBC     Status: None   Collection Time: 11/21/16  4:30 PM  Result Value Ref Range   Order Confirmation ORDER PROCESSED BY BLOOD BANK   Glucose, capillary     Status: Abnormal   Collection Time: 11/22/16  8:20 AM  Result Value Ref Range   Glucose-Capillary 252 (H) 65 - 99 mg/dL   Comment 1 Notify RN   Glucose, capillary     Status: Abnormal   Collection Time: 11/22/16 11:41 AM  Result Value Ref Range   Glucose-Capillary 368 (H) 65 - 99 mg/dL   Comment 1 Notify RN   Hemoglobin A1c     Status: Abnormal   Collection Time: 11/22/16 12:42 PM  Result Value Ref Range   Hgb A1c MFr Bld 6.3 (H) 4.8 - 5.6 %    Comment: (NOTE) Pre diabetes:          5.7%-6.4% Diabetes:              >6.4% Glycemic control for   <7.0% adults with diabetes    Mean Plasma Glucose 134.11 mg/dL  Glucose, capillary     Status: Abnormal   Collection Time: 11/22/16  3:27 PM  Result Value Ref Range    Glucose-Capillary 174 (H) 65 - 99 mg/dL   Comment 1 Notify RN   Glucose, capillary     Status: Abnormal   Collection Time: 11/22/16  7:41 PM  Result Value Ref Range   Glucose-Capillary 135 (H) 65 - 99 mg/dL   Comment 1 Notify RN    Comment 2 Document in Chart   Glucose, capillary     Status: Abnormal  Collection Time: 11/22/16 11:31 PM  Result Value Ref Range   Glucose-Capillary 157 (H) 65 - 99 mg/dL   Comment 1 Notify RN    Comment 2 Document in Chart   Glucose, capillary     Status: Abnormal   Collection Time: 11/23/16  3:20 AM  Result Value Ref Range   Glucose-Capillary 183 (H) 65 - 99 mg/dL   Comment 1 Notify RN    Comment 2 Document in Chart       Imaging Results (Last 48 hours)  Mr Jeri Cos Wo Contrast  Result Date: 11/22/2016 CLINICAL DATA:  Postop day 1 right cerebellar tumor resection. History of breast cancer. EXAM: MRI HEAD WITHOUT AND WITH CONTRAST TECHNIQUE: Multiplanar, multiecho pulse sequences of the brain and surrounding structures were obtained without and with intravenous contrast. CONTRAST:  66mL MULTIHANCE GADOBENATE DIMEGLUMINE 529 MG/ML IV SOLN COMPARISON:  11/19/2016 FINDINGS: Brain: Sequelae of interval suboccipital craniectomy and cerebellar mass resection are identified. Blood products are present in the resection cavity. Pneumocephalus is noted, including gas in the lateral and third ventricles. A 4.1 x 2.4 x 5.7 cm extra-axial fluid collection is present at the craniectomy site extending into the posterior soft tissues of the upper neck. There is moderate volume restricted diffusion involving the medial aspects of both cerebellar hemispheres and a portion of the remaining cerebellar vermis consistent with acute infarct. Associated cytotoxic edema is noted. There is also residual vasogenic edema the most notable in the right cerebellar hemisphere and superior vermis extending into the right middle cerebellar peduncle. Edema and blood  products result in persistent partial effacement of the fourth ventricle which has mildly increased, however there is no significant dilatation of the lateral or third ventricles to indicate obstructive hydrocephalus. There is a 2.5 x 1.1 cm focus of enhancement in the medial right cerebellum at the level of the middle cerebellar peduncle (series 11, image 14), with tumor present in this location on the preoperative study. There is leptomeningeal enhancement involving the inferior aspects of both cerebellar hemispheres, likely related to the acute infarcts as this was not present on the preoperative study. No supratentorial mass is present. Small foci of T2 hyperintensity scattered throughout the subcortical and deep cerebral white matter bilaterally are unchanged and nonspecific but compatible with chronic small vessel ischemic disease, mild-to-moderate for age. Vascular: Major intracranial vascular flow voids are preserved, with the right vertebral artery appearing dominant. Skull and upper cervical spine: Suboccipital craniectomy. No suspicious marrow lesion. Sinuses/Orbits: Unremarkable orbits. Paranasal sinuses and mastoid air cells are clear. Other: None. IMPRESSION: 1. Postoperative changes from interval cerebellar mass resection as above. 2.5 cm focus of enhancement in the medial right cerebellum concerning for residual tumor. 2. Moderate-sized acute infarct involving the inferior and medial cerebellar hemispheres. 3. Slightly increased effacement of the fourth ventricle without evidence of obstructive hydrocephalus. Electronically Signed   By: Logan Bores M.D.   On: 11/22/2016 22:47   Dg Chest Port 1 View  Result Date: 11/21/2016 CLINICAL DATA:  Central line placement EXAM: PORTABLE CHEST 1 VIEW COMPARISON:  03/28/2013 CXR and 11/19/2016 CT FINDINGS: The heart size and mediastinal contours are within normal limits. Aortic atherosclerosis at the arch without aneurysm. Right IJ central line catheter  tip is seen at the cavoatrial junction. No pneumothorax. Port catheter tip is seen in the proximal to mid SVC. Both lungs are clear. The visualized skeletal structures are unremarkable. Right axillary lymph node dissection clips are noted. IMPRESSION: 1. Central venous line catheter tip at the  cavoatrial junction without pneumothorax. 2. Clear lungs. 3. Aortic atherosclerosis. Electronically Signed   By: Ashley Royalty M.D.   On: 11/21/2016 17:56        Medical Problem List and Plan: 1.  Right side weakness with gait disorder secondary to right cerebellar mass status post craniotomy 11/21/2016 2.  DVT Prophylaxis/Anticoagulation: SCDs. Monitor for any signs of DVT 3. Pain Management: Hydrocodone as needed 4. Mood: Provide emotional support 5. Neuropsych: This patient is capable of making decisions on her own behalf. 6. Skin/Wound Care: Routine skin checks 7. Fluids/Electrolytes/Nutrition: Routine I&O with follow-up chemistries 8. Hypertension. Norvasc 10 mg daily 9. History of right breast cancer with mastectomy as well as chemotherapy. Continue tamoxifen daily 10. Hyperlipidemia. Pravachol 11. MRSA PCR screening positive. Contact precautions        ANGIULLI,DANIEL J., PA-C 11/23/2016

## 2016-11-26 NOTE — Progress Notes (Signed)
Initial Nutrition Assessment  DOCUMENTATION CODES:   Not applicable  INTERVENTION:  Continue Ensure Enlive po BID, each supplement provides 350 kcal and 20 grams of protein.  Encourage adequate PO intake.   NUTRITION DIAGNOSIS:   Increased nutrient needs related to chronic illness as evidenced by estimated needs.  GOAL:   Patient will meet greater than or equal to 90% of their needs  MONITOR:   PO intake, Supplement acceptance, Labs, Weight trends, Skin, I & O's  REASON FOR ASSESSMENT:   Malnutrition Screening Tool    ASSESSMENT:   61 y.o. right handed female with history of hypertension, right breast cancer treated with mastectomy subsequent chemotherapyAnd maintained on tamoxifen,  tobacco abuse. Presented 11/19/2016 with progressive vertigo ataxia occasional headaches. MRI obtained showed cerebellar lesion. suboccipital craniectomy for gross total resection of cerebellar tumor 11/21/2016 Postoperative MRI of the brain Showed postoperative changes after resection minimal residual tumor as well as moderate sized acute infarct involving the inferior and medial cerebellar hemispheres.   Pt reports appetite has been improving. Meal completion has been 25-50% with 50% at lunch today. Pt reports eating well PTA with no other difficulties. Weight has been stable. Pt currently has Ensure ordered and has been consuming them. RD to continue with current orders. Pt encouraged to eat her food at meals and to drink her supplements.   Unable to complete Nutrition-Focused physical exam at this time.   Labs and medications reviewed.   Diet Order:  Diet regular Room service appropriate? Yes; Fluid consistency: Thin  Skin:   (Incision on head)  Last BM:  8/26  Height:   Ht Readings from Last 1 Encounters:  11/23/16 5\' 4"  (1.626 m)    Weight:   Wt Readings from Last 1 Encounters:  11/24/16 167 lb 1.7 oz (75.8 kg)    Ideal Body Weight:  54.54 kg  BMI:  Body mass index is 28.68  kg/m.  Estimated Nutritional Needs:   Kcal:  1800-2000  Protein:  85-95 grams  Fluid:  1.8 - 2 L/day  EDUCATION NEEDS:   No education needs identified at this time  Corrin Parker, MS, RD, LDN Pager # [phone] After hours/ weekend pager # [phone]
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
## 2016-11-26 NOTE — Progress Notes (Signed)
Physical Therapy Session Note  Patient Details  Name: Tasha Ortiz MRN: [id number] Date of Birth: 1955-08-16  Today's Date: 11/26/2016 PT Individual Time: 0800-0900 PT Individual Time Calculation (min): 60 min   Short Term Goals: Week 1:  PT Short Term Goal 1 (Week 1): STG= LTG due to short LOS  Skilled Therapeutic Interventions/Progress Updates:    Pt supine in bed upon PT arrival, agreeable to therapy tx and denies pain. Pt transferred from supine to sitting EOB with supervision and sat EOB to don shoes with supervision. Pt ambulated x 15 ft to the bathroom using RW with min assist, doffed/donned pants and cleaned peri area with single UE support working on functional dynamic balance. Pt propelled w/c x 150 ft to the gym using B UEs and supervision. Session focused on dynamic balance activities and postural control. Pt ambulated in parallel bars 2 x 10 ft forwards and 2 x 10 ft backwards with intermittent UE support for balance, 2 x 10 ft forwards/2 x 10 ft backwards without UE support, and 2 x 10 ft forwards tandem stepping/ 2x10 ft backwards tandem stepping with intermittent UE support for balance, pt continues to lose balance to R requiring min to mod assist to correct.  PT sitting on theraball x 8 minutes working on postural control while performing seated marches, lateral pelvic tilits and pelvic circles with verbal cues and visual feedback using mirror to maintain midline and upright posture. Pt worked on dynamic standing balance on foam without UE support x3 minutes while raising alternating UEs, occasional LOB to R with min assist to correct. Pt left seated at end of session in w/c with needs in reach.   Therapy Documentation Precautions:  Precautions Precautions: Fall Precaution Comments: Ataxic Restrictions Weight Bearing Restrictions: No     See Function Navigator for Current Functional Status.   Therapy/Group: Individual Therapy  Netta Corrigan, PT, DPT 11/26/2016,  8:12 AM

## 2016-11-26 NOTE — Progress Notes (Signed)
[facility] PHYSICAL MEDICINE & REHABILITATION     PROGRESS NOTE    Subjective/Complaints: Had a reasonable night. Still some nausea but medication seems to keep it under control. Had a busy Sunday---ready for more today!  ROS: pt denies  vomiting, diarrhea, cough, shortness of breath or chest pain   Objective: Vital Signs: Blood pressure 112/71, pulse 68, temperature 98.7 F (37.1 C), temperature source Oral, resp. rate 17, weight 75.8 kg (167 lb 1.7 oz), last menstrual period 08/16/2012, SpO2 99 %. No results found.  Recent Labs  11/26/16 0800  WBC 9.2  HGB 12.9  HCT 39.0  PLT 218    Recent Labs  11/26/16 0800  NA 139  K 3.8  CL 104  GLUCOSE 169*  BUN 17  CREATININE 0.83  CALCIUM 9.2   CBG (last 3)   Recent Labs  11/25/16 2144 11/26/16 0640 11/26/16 1133  GLUCAP 204* 148* 197*    Wt Readings from Last 3 Encounters:  11/24/16 75.8 kg (167 lb 1.7 oz)  11/23/16 75.8 kg (167 lb 3.2 oz)  11/13/16 78 kg (172 lb)    Physical Exam:  Eyes: EOMare normal.  Pupils reactive to light Neck: Normal range of motion. Neck supple. No thyromegalypresent.  Cardiovascular:RRR without murmur. No JVD .  Respiratory: CTA Bilaterally without wheezes or rales. Normal effort   GI: Soft. Bowel sounds are normal. She exhibits no distension.  Skin Craniotomy site clean and dry with sutures Neurological: She is alert.  Oriented to person place and date of birth. Follows simple commands. Mood is flat but appropriate. Speech is clear. Reasonable insight and awareness Continued dysmetria, right finger-nose-finger, mild dysmetria, right heel-to-shin. No evidence of dysmetria, left finger-nose to finger or left shoulder shin. Motor strength is 5/5 bilateral deltoid, biceps, triceps, grip, hip flexor, knee extensor, ankle dorsal flexor. Sensation intact to light touch bilateral upper and lower limb as well as facial region. Right>left lateral nystagmus Psyc: pleasant and  appropriate  Assessment/Plan: 1. Functional deficits secondary to cerebellar tumor which require 3+ hours per day of interdisciplinary therapy in a comprehensive inpatient rehab setting. Physiatrist is providing close team supervision and 24 hour management of active medical problems listed below. Physiatrist and rehab team continue to assess barriers to discharge/monitor patient progress toward functional and medical goals.  Function:  Bathing Bathing position   Position: Shower  Bathing parts Body parts bathed by patient: Right arm, Chest, Left arm, Abdomen, Front perineal area, Buttocks, Right upper leg, Left upper leg, Right lower leg, Left lower leg Body parts bathed by helper: Back  Bathing assist Assist Level: Touching or steadying assistance(Pt > 75%)      Upper Body Dressing/Undressing Upper body dressing   What is the patient wearing?: Pull over shirt/dress     Pull over shirt/dress - Perfomed by patient: Thread/unthread right sleeve, Thread/unthread left sleeve, Put head through opening, Pull shirt over trunk          Upper body assist Assist Level: Supervision or verbal cues      Lower Body Dressing/Undressing Lower body dressing   What is the patient wearing?: Underwear, Pants, Non-skid slipper socks, Ted Hose Underwear - Performed by patient: Thread/unthread right underwear leg, Thread/unthread left underwear leg, Pull underwear up/down   Pants- Performed by patient: Thread/unthread right pants leg, Thread/unthread left pants leg, Pull pants up/down   Non-skid slipper socks- Performed by patient: Don/doff right sock, Don/doff left sock  TED Hose - Performed by helper: Don/doff right TED hose, Don/doff left TED hose  Lower body assist Assist for lower body dressing: Touching or steadying assistance (Pt > 75%)      Toileting Toileting Toileting activity did not occur: Refused Toileting steps completed by patient: Adjust clothing prior to  toileting, Performs perineal hygiene, Adjust clothing after toileting      Toileting assist Assist level:  (Mod A for balance)   Transfers Chair/bed transfer   Chair/bed transfer method: Stand pivot Chair/bed transfer assist level: Touching or steadying assistance (Pt > 75%) Chair/bed transfer assistive device: Walker, Air cabin crew     Max distance: 40 ft Assist level: Touching or steadying assistance (Pt > 75%)   Wheelchair   Type: Manual Max wheelchair distance: 150 ft Assist Level: Supervision or verbal cues  Cognition Comprehension Comprehension assist level: Follows basic conversation/direction with no assist  Expression Expression assist level: Expresses basic 75 - 89% of the time/requires cueing 10 - 24% of the time. Needs helper to occlude trach/needs to repeat words.  Social Interaction Social Interaction assist level: Interacts appropriately 90% of the time - Needs monitoring or encouragement for participation or interaction.  Problem Solving Problem solving assist level: Solves basic 50 - 74% of the time/requires cueing 25 - 49% of the time  Memory Memory assist level: Recognizes or recalls 50 - 74% of the time/requires cueing 25 - 49% of the time   Medical Problem List and Plan: 1. Right side weakness with gait disordersecondary to right cerebellar mass status post craniotomy 11/21/2016  -continue therapies  -vestibular symptom mgt as needed 2. DVT Prophylaxis/Anticoagulation: SCDs. Monitor for any signs of DVT 3. Pain Management: Hydrocodone as needed 4. Mood: Provide emotional support 5. Neuropsych: This patient iscapable of making decisions on herown behalf. 6. Skin/Wound Care: Routine skin checks 7. Fluids/Electrolytes/Nutrition: encourage PO  -I personally reviewed the patient's labs today.   8.Hypertension. Norvasc 10 mg daily  -bp controlled at present 9.History of right breast cancer with mastectomy as well as chemotherapy.  Continue tamoxifen daily 10.Hyperlipidemia. Pravachol 11.MRSA PCR screening positive. Contact precautions   LOS (Days) 2 A FACE TO FACE EVALUATION WAS PERFORMED  Meredith Staggers, MD 11/26/2016 12:59 PM

## 2016-11-27 ENCOUNTER — Inpatient Hospital Stay (HOSPITAL_COMMUNITY): Payer: BLUE CROSS/BLUE SHIELD | Admitting: Physical Therapy

## 2016-11-27 ENCOUNTER — Inpatient Hospital Stay (HOSPITAL_COMMUNITY): Payer: BLUE CROSS/BLUE SHIELD | Admitting: Occupational Therapy

## 2016-11-27 LAB — GLUCOSE, CAPILLARY
Glucose-Capillary: 160 mg/dL — ABNORMAL HIGH (ref 65–99)
Glucose-Capillary: 165 mg/dL — ABNORMAL HIGH (ref 65–99)
Glucose-Capillary: 207 mg/dL — ABNORMAL HIGH (ref 65–99)
Glucose-Capillary: 216 mg/dL — ABNORMAL HIGH (ref 65–99)
Glucose-Capillary: 235 mg/dL — ABNORMAL HIGH (ref 65–99)

## 2016-11-27 MED ORDER — GADOBENATE DIMEGLUMINE 529 MG/ML IV SOLN
15.0000 mL | Freq: Once | INTRAVENOUS | Status: AC | PRN
  Administered 2016-11-19: 15 mL via INTRAVENOUS

## 2016-11-27 NOTE — Progress Notes (Signed)
Physical Therapy Note  Patient Details  Name: Tasha Ortiz MRN: [id number] Date of Birth: December 27, 1955 Today's Date: 11/27/2016    Time: 930-956 26 minutes  1:1 No c/o pain.  Session focused on gait training with RW.  Pt performed gait in controlled environment 100' x 2 with min A, mod/max cuing for Rt attention and to avoid obstacles on Rt.  Gait with obstacle negotiation and side stepping with RW on carpet with mod cuing for motor planning, min A for balance due to LOB to Rt x 3 during session.   Emmalia Heyboer 11/27/2016, 9:56 AM

## 2016-11-27 NOTE — Progress Notes (Signed)
Physical Therapy Session Note  Patient Details  Name: Tasha Ortiz MRN: [id number] Date of Birth: 02-17-1956  Today's Date: 11/27/2016 PT Individual Time: 1303-1403 PT Individual Time Calculation (min): 60 min   Short Term Goals: Week 1:  PT Short Term Goal 1 (Week 1): STG= LTG due to short LOS  Skilled Therapeutic Interventions/Progress Updates:  Pt received in bed & agreeable to tx, denying c/o pain. Pt transferred supine<>sitting EOB with mod I & hospital bed features. Pt dons shoes with set up assistance. Pt ambulates within room and bathroom with RW & close supervision for gait. Pt requires supervision for toilet transfer to elevated seat and with continent void and BM on toilet; pt completed peri hygiene with supervision assist. Pt ambulates room<>gym with RW & supervision with continued impaired coordination RLE. In gym pt stood on compliant surface with wide and narrow base of support with eyes open and closed all without UE support and min assist for balance for balance retraining. Pt performed standing hip/knee flexion exercises and heel raises with UE support on RW and task focusing on BLE strengthening. Pt completed 5x sit<>stand transfers without UE support for BLE strengthening and cuing for technique. At end of session pt left in bed with alarm set & all needs within reach.  Therapy Documentation Precautions:  Precautions Precautions: Fall Precaution Comments: Ataxic Restrictions Weight Bearing Restrictions: No     See Function Navigator for Current Functional Status.   Therapy/Group: Individual Therapy  Waunita Schooner 11/27/2016, 3:44 PM
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
## 2016-11-27 NOTE — Progress Notes (Signed)
Social Work Patient ID: Tasha Ortiz, female   DOB: 08-20-1955, 61 y.o.   MRN: [id number]  Met with pt and daughter-[name] to inform team conference goals supervision level and target discharge 9/4. Both very pleased with her progress and feel this will work out well. Pt's sister is coming to stay for the week she comes home and then hopefully daughter will be there she is still working with her job on her leave. Discussed follow up and that if she needs to go to her apartment she is working on stairs. Will continue to work on discharge needs.

## 2016-11-27 NOTE — Progress Notes (Signed)
[place] PHYSICAL MEDICINE & REHABILITATION     PROGRESS NOTE    Subjective/Complaints: No new problems. Therapy went without any major issues. Still having some double vision and nausea. Minimal headache  ROS: pt denies   vomiting, diarrhea, cough, shortness of breath or chest pain   Objective: Vital Signs: Blood pressure 126/83, pulse 65, temperature 99.2 F (37.3 C), temperature source Oral, resp. rate 16, weight 75.8 kg (167 lb 1.7 oz), last menstrual period 08/16/2012, SpO2 97 %. No results found.  Recent Labs  11/26/16 0800  WBC 9.2  HGB 12.9  HCT 39.0  PLT 218    Recent Labs  11/26/16 0800  NA 139  K 3.8  CL 104  GLUCOSE 169*  BUN 17  CREATININE 0.83  CALCIUM 9.2   CBG (last 3)   Recent Labs  11/26/16 1950 11/27/16 0013 11/27/16 0638  GLUCAP 295* 216* 165*    Wt Readings from Last 3 Encounters:  11/24/16 75.8 kg (167 lb 1.7 oz)  11/23/16 75.8 kg (167 lb 3.2 oz)  11/13/16 78 kg (172 lb)    Physical Exam:  Eyes: EOMare normal.  Pupils reactive to light Neck: Normal range of motion. Neck supple. No thyromegalypresent.  Cardiovascular:RRR without murmur. No JVD .  Respiratory: CTA Bilaterally without wheezes or rales. Normal effort    GI: Soft. Bowel sounds are normal. She exhibits no distension.  Skin Craniotomy site clean and dry with sutures Neurological: She is alert.  Oriented to person place and date of birth--no changes. Reasonable insight and awareness. No obvious abnormalities in EOM but needs to squint to focus. Follows simple commands.  Ongoing dysmetria, right finger-nose-finger, mild dysmetria, right heel-to-shin. No evidence of dysmetria, left finger-nose to finger or left shoulder shin. Motor strength is 5/5 bilateral deltoid, biceps, triceps, grip, hip flexor, knee extensor, ankle dorsal flexor. Sensation intact to light touch bilateral upper and lower limb as well as facial region. Right>left lateral nystagmus Psyc: pleasant  and appropriate  Assessment/Plan: 1. Functional deficits secondary to cerebellar tumor which require 3+ hours per day of interdisciplinary therapy in a comprehensive inpatient rehab setting. Physiatrist is providing close team supervision and 24 hour management of active medical problems listed below. Physiatrist and rehab team continue to assess barriers to discharge/monitor patient progress toward functional and medical goals.  Function:  Bathing Bathing position   Position: Shower  Bathing parts Body parts bathed by patient: Right arm, Chest, Left arm, Abdomen, Front perineal area, Buttocks, Right upper leg, Left upper leg, Right lower leg, Left lower leg Body parts bathed by helper: Back  Bathing assist Assist Level: Touching or steadying assistance(Pt > 75%)      Upper Body Dressing/Undressing Upper body dressing   What is the patient wearing?: Pull over shirt/dress     Pull over shirt/dress - Perfomed by patient: Thread/unthread right sleeve, Thread/unthread left sleeve, Put head through opening, Pull shirt over trunk          Upper body assist Assist Level: Supervision or verbal cues      Lower Body Dressing/Undressing Lower body dressing   What is the patient wearing?: Underwear, Pants, Non-skid slipper socks, Ted Hose Underwear - Performed by patient: Thread/unthread right underwear leg, Thread/unthread left underwear leg, Pull underwear up/down   Pants- Performed by patient: Thread/unthread right pants leg, Thread/unthread left pants leg, Pull pants up/down   Non-skid slipper socks- Performed by patient: Don/doff right sock, Don/doff left sock  TED Hose - Performed by helper: Don/doff right TED hose, Don/doff left TED hose  Lower body assist Assist for lower body dressing: Touching or steadying assistance (Pt > 75%)      Toileting Toileting Toileting activity did not occur: Refused Toileting steps completed by patient: Adjust clothing prior  to toileting, Performs perineal hygiene, Adjust clothing after toileting      Toileting assist Assist level:  (Mod A for balance)   Transfers Chair/bed transfer   Chair/bed transfer method: Stand pivot Chair/bed transfer assist level: Touching or steadying assistance (Pt > 75%) Chair/bed transfer assistive device: Medical sales representative     Max distance: 120 ft Assist level: Touching or steadying assistance (Pt > 75%)   Wheelchair   Type: Manual Max wheelchair distance: 150 ft Assist Level: Supervision or verbal cues  Cognition Comprehension Comprehension assist level: Follows basic conversation/direction with no assist  Expression Expression assist level: Expresses basic 75 - 89% of the time/requires cueing 10 - 24% of the time. Needs helper to occlude trach/needs to repeat words.  Social Interaction Social Interaction assist level: Interacts appropriately 90% of the time - Needs monitoring or encouragement for participation or interaction.  Problem Solving Problem solving assist level: Solves basic 50 - 74% of the time/requires cueing 25 - 49% of the time  Memory Memory assist level: Recognizes or recalls 50 - 74% of the time/requires cueing 25 - 49% of the time   Medical Problem List and Plan: 1. Right side weakness with gait disordersecondary to right cerebellar mass status post craniotomy 11/21/2016  -continue therapies  -vestibular symptom mgt/acclimation   -team conference today 2. DVT Prophylaxis/Anticoagulation: SCDs. Monitor for any signs of DVT 3. Pain Management: Hydrocodone as needed 4. Mood: Provide emotional support 5. Neuropsych: This patient iscapable of making decisions on herown behalf. 6. Skin/Wound Care: surgical wound clean. Remove sutures soon 7. Fluids/Electrolytes/Nutrition: encourage PO  -intake reasonable.   8.Hypertension. Norvasc 10 mg daily  -bp controlled at present--no changes 9.History of right breast cancer with mastectomy  as well as chemotherapy. Continue tamoxifen daily 10.Hyperlipidemia. Pravachol 11.MRSA PCR screening positive. Contact precautions   LOS (Days) 3 A FACE TO FACE EVALUATION WAS PERFORMED  Alger Simons T, MD 11/27/2016 10:50 AM

## 2016-11-27 NOTE — Progress Notes (Signed)
Occupational Therapy Session Note  Patient Details  Name: Tasha Ortiz MRN: [id number] Date of Birth: May 23, 1955  Today's Date: 11/27/2016 OT Individual Time: 8756-4332 OT Individual Time Calculation (min): 75 min    Short Term Goals: Week 1:  OT Short Term Goal 1 (Week 1): Pt will complete toilet transfers with Min A OT Short Term Goal 2 (Week 1): Pt will complete sit<stand during dressing with Min A and LRAD OT Short Term Goal 3 (Week 1): Pt will complete 2 grooming tasks in standing to improve balance   Skilled Therapeutic Interventions/Progress Updates:    Upon entering the room, pt supine in bed with no c/o pain and agreeable to OT intervention. Pt ambulates with RW and min A to bathroom for toileting. Pt performed clothing management and hygiene with steady assistance for balance. Pt having successful BM. Pt removing clothing from seated position on toilet with cues from therapist for safety. Pt transferred onto TTB for bathing at shower level. Pt remained seated with mod cues for initiation and sequencing to wash all parts. Pt exiting the shower and dressing while seated on EOB. Pt needing rest break secondary to fatigue with functional tasks. OT continues to educate pt on energy conservation technique. Pt remained supine to rest with call bell and all needed items within reach upon exiting the room.   Therapy Documentation Precautions:  Precautions Precautions: Fall Precaution Comments: Ataxic Restrictions Weight Bearing Restrictions: No General:   Vital Signs:   Pain: Pain Assessment Pain Assessment: No/denies pain ADL: ADL ADL Comments: Please see functional navigator for ADL status Vision   Perception    Praxis   Exercises:   Other Treatments:    See Function Navigator for Current Functional Status.   Therapy/Group: Individual Therapy  Gypsy Decant 11/27/2016, 12:48 PM

## 2016-11-27 NOTE — IPOC Note (Signed)
Overall Plan of Care [facility]) Patient Details Name: Tasha Ortiz MRN: [id number] DOB: April 25, 1955  Admitting Diagnosis: Cerebellar Mass Resection  Hospital Problems: Principal Problem:   Cerebellar tumor ([place]) Active Problems:   Essential hypertension, benign   Primary cancer of upper outer quadrant of right female breast [facility])     Functional Problem List: Nursing Behavior, Bladder, Bowel, Edema, Endurance, Medication Management, Motor, Nutrition, Pain, Perception, Safety, Sensory, Skin Integrity  PT Balance, Behavior, Endurance, Motor, Nutrition, Pain, Perception, Safety, Sensory  OT Balance, Safety, Cognition, Vision, Endurance, Edema, Motor, Perception  SLP    TR         Basic ADL's: OT Grooming, Bathing, Dressing, Toileting     Advanced  ADL's: OT Simple Meal Preparation     Transfers: PT Bed Mobility, Bed to Chair, Car, [employer], Futures trader, Metallurgist: PT Ambulation, Emergency planning/management officer, Stairs     Additional Impairments: OT None  SLP        TR      Anticipated Outcomes Item Anticipated Outcome  Self Feeding No goal  Swallowing      Basic self-care  Presenter, broadcasting Transfers Supervision   Bowel/Bladder  mod I  Transfers  supervsion  Locomotion  supervision  Communication     Cognition     Pain  modI  Safety/Judgment  Mod I   Therapy Plan: PT Intensity: Minimum of 1-2 x/day ,45 to 90 minutes PT Frequency: 5 out of 7 days PT Duration Estimated Length of Stay: 7-10 days OT Intensity: Minimum of 1-2 x/day, 45 to 90 minutes OT Frequency: 5 out of 7 days OT Duration/Estimated Length of Stay: 10-12 days      Team Interventions: Nursing Interventions Patient/Family Education, Bladder Management, Bowel Management, Disease Management/Prevention, Pain Management, Medication Management, Skin Care/Wound Management, Cognitive Remediation/Compensation, Discharge Planning, Psychosocial  Support, Dysphagia/Aspiration Precaution Training  PT interventions Ambulation/gait training, Training and development officer, Cognitive remediation/compensation, Community reintegration, Discharge planning, Functional mobility training, Patient/family education, Therapeutic Exercise, Visual/perceptual remediation/compensation, UE/LE Coordination activities, Therapeutic Activities, DME/adaptive equipment instruction, Neuromuscular re-education, Psychosocial support, Stair training, UE/LE Strength taining/ROM, Wheelchair propulsion/positioning, Splinting/orthotics, Functional electrical stimulation  OT Interventions Balance/vestibular training, Discharge planning, Pain management, Self Care/advanced ADL retraining, Therapeutic Activities, UE/LE Coordination activities, Cognitive remediation/compensation, Disease mangement/prevention, Functional mobility training, Patient/family education, Therapeutic Exercise, Visual/perceptual remediation/compensation, DME/adaptive equipment instruction, Neuromuscular re-education, Psychosocial support, UE/LE Strength taining/ROM, Wheelchair propulsion/positioning  SLP Interventions    TR Interventions    SW/CM Interventions Discharge Planning, Psychosocial Support, Patient/Family Education   Barriers to Discharge MD  Medical stability and Home enviroment access/loayout  Nursing Medical stability    PT Inaccessible home environment, Decreased caregiver support 13 STE  OT Home environment access/layout 13 steps to apartment. Bathroom is not w/c or walker accessible  SLP      SW Decreased caregiver support At this point does nto have 24 hr care   Team Discharge Planning: Destination: PT-Home ,OT- Home , SLP-  Projected Follow-up: PT-Home health PT, OT-  Home health OT, SLP-  Projected Equipment Needs: PT-To be determined, OT- Tub/shower bench, 3 in 1 bedside comode, SLP-  Equipment Details: PT- , OT-  Patient/family involved in discharge planning: PT- Patient,   OT-Patient, Family member/caregiver, SLP-   MD ELOS: 8-12d Medical Rehab Prognosis:  Good Assessment:  61 y.o.right handed femalewith history of hypertension, right breast cancer treated with mastectomy subsequent chemotherapyAnd maintained on tamoxifen, tobacco abuse. Patient lives with daughter. Independent prior to admissionand  daughter works during the day. Presented 11/19/2016 with progressive vertigo ataxia occasional headaches. MRI obtained showed cerebellar lesion. CT chest abdomen pelvis negative for any metastatic disease. Underwent suboccipital craniectomy for gross total resection of cerebellar tumor 11/21/2016 per Dr. [name]. Postoperative MRI of the brain Showed postoperative changes after resection minimal residual tumor as well as moderate sized acute infarct involving the inferior and medial cerebellar hemispheres. Decadron protocol as indicated. MRSA PCR screen positive maintained on contact precautions   Now requiring 24/7 Rehab RN,MD, as well as CIR level PT, OT and SLP.  Treatment team will focus on ADLs and mobility with goals set at sup See Team Conference Notes for weekly updates to the plan of care

## 2016-11-27 NOTE — Progress Notes (Signed)
Occupational Therapy Note  Patient Details  Name: Tasha Ortiz MRN: [id number] Date of Birth: May 27, 1955  Today's Date: 11/27/2016 OT Individual Time: 0379-5583 OT Individual Time Calculation (min): 30 min   No c/o pain.  Pt received in bed holding her R eye closed.  Discussed option of eye patch to strengthen eyes for her diplopia. She stated that it has gradually improved and it occurs intermittently now.  Suggested her family bring in her reading glasses to place a patch over a lense that can be alternated.  Pt worked on dynamic balance and postural control in sitting and standing.  Good midline awareness and control today. In sitting she can wt shift without LOB.  Standing with close S for static and steadying A with dynamic. She worked on weight shifting side to side and was able to shift to R easily but hesitated to the L.  Worked on LLE knee flex/ext in standing and mini squats 10 x 2.  She completed stand pivot to arm chair with min A then completed sit >< stand with B hands on thighs  10 x2 with close S.  Transferred back to bed to rest. Pt participated well and stated she felt stronger. Bed alarm set and all needs met.      [place] 11/27/2016, 12:01 PM

## 2016-11-28 ENCOUNTER — Encounter (HOSPITAL_COMMUNITY): Payer: BLUE CROSS/BLUE SHIELD | Admitting: Psychology

## 2016-11-28 ENCOUNTER — Inpatient Hospital Stay (HOSPITAL_COMMUNITY): Payer: BLUE CROSS/BLUE SHIELD | Admitting: Physical Therapy

## 2016-11-28 ENCOUNTER — Inpatient Hospital Stay (HOSPITAL_COMMUNITY): Payer: BLUE CROSS/BLUE SHIELD | Admitting: Occupational Therapy

## 2016-11-28 LAB — GLUCOSE, CAPILLARY
Glucose-Capillary: 185 mg/dL — ABNORMAL HIGH (ref 65–99)
Glucose-Capillary: 204 mg/dL — ABNORMAL HIGH (ref 65–99)
Glucose-Capillary: 205 mg/dL — ABNORMAL HIGH (ref 65–99)
Glucose-Capillary: 266 mg/dL — ABNORMAL HIGH (ref 65–99)

## 2016-11-28 NOTE — Progress Notes (Addendum)
Physical Therapy Session Note  Patient Details  Name: Tasha Ortiz MRN: [id number] Date of Birth: 1955-07-20  Today's Date: 11/28/2016 PT Individual Time: 1003-1100 and 1351-1448 PT Individual Time Calculation (min): 57 min and 57 min   Short Term Goals: Week 1:  PT Short Term Goal 1 (Week 1): STG= LTG due to short LOS  Skilled Therapeutic Interventions/Progress Updates:  Treatment 1: Pt received in bed & agreeable to tx, denying c/o pain. Pt ambulates room<>gym with RW & supervision with decreased weight shifting LLE and decreased step length RLE. Gait x 50 ft x 3 trials without AD & min assist with therapist providing cuing for gait pattern and providing manual facilitiation for increased weight shifting L; pt appears to have difficulty weight shifting LLE to allow increased step length RLE. During session therapist continued to educated pt on diagnosis and recovery. Pt reports she is unsure of d/c plan (local apartment with full flight & 1 rail to enter, or apartment with daughter in [place] with level entry). Pt negotiated 16 steps with R rail to simulate local home environment with supervision overall; therapist provides max cuing for compensatory pattern. Pt engaged in standing cone taps and standing on one leg (alternating LE) with task focusing on standing balance, weight shifting, and coordination. Pt with improving ability to weight shift for cone taps after performing single leg stance exercises. Pt emotional over current level of function with therapist providing emotional support and encouragement regarding current abilities & progress. At end of session pt left in bed with alarm set & all needs within reach.  Treatment 2: Pt received in bed & agreeable to tx, denying c/o pain. Pt ambulates in room to bathroom without AD and min assist with therapist providing manual facilitation for weight shifting L and cuing for increased step length RLE. Pt ambulates room<>dayroom with RW &  supervision. Pt engaged in trampoline ball toss with normal and narrow BOS with steady assist for balance. Pt negotiated obstacle course requiring cuing for stepping over poles, weaving between cones, and laterally ambulating. Pt would benefit from continued practice with task prior to d/c as pt with difficulty avoiding obstacles. Pt engaged in zoom ball with supervision/steady assist for balance; task focused on endurance training. At end of session pt returned to bed & left with all needs within reach & alarm set.   Addendum: Pt performed toilet transfer, peri hygiene, and clothing management with supervision. Pt with continent void on toilet.    Therapy Documentation Precautions:  Precautions Precautions: Fall Precaution Comments: Ataxic Restrictions Weight Bearing Restrictions: No   See Function Navigator for Current Functional Status.   Therapy/Group: Individual Therapy  Waunita Schooner 11/28/2016, 4:11 PM

## 2016-11-28 NOTE — Progress Notes (Signed)
Occupational Therapy Session Note  Patient Details  Name: Tasha Ortiz MRN: [id number] Date of Birth: [birth year]  Today's Date: 11/28/2016 OT Individual Time: 0830-0930 OT Individual Time Calculation (min): 60 min    Short Term Goals: Week 1:  OT Short Term Goal 1 (Week 1): Pt will complete toilet transfers with Min A OT Short Term Goal 2 (Week 1): Pt will complete sit<stand during dressing with Min A and LRAD OT Short Term Goal 3 (Week 1): Pt will complete 2 grooming tasks in standing to improve balance   Skilled Therapeutic Interventions/Progress Updates:    pt seen this session for ADL retraining with a focus on balance and transfers. Pt received in bed requesting a shower. She used her RW to ambulate to drawers to pick out an outfit and then to bathroom to transfer onto tub bench with steadying A.   Once in the shower she was able to bathe with S. She ambulated back to edge of bed to dress and did so with S, demonstrating controlled sit to stand and standing while pulling her jumpsuit on.  Pt completed grooming and then ambulated to gym with Rw with steadying A.   In gym she worked on various activities for LE coordination and standing balance: Sit >< stand with hands on thighs 10x, then holding bar 10x, then reaching bar over head once standing leaning side to side and then sitting 10x Standing with light support as she completed stepping out laterally then together, step touch singles, step touch doubles. In sitting, postural exercises with dowel bar rows focusing on shoulder retraction. She participated extremely well demonstrating good activity tolerance. Pt then ambulated back to her room with walker and steadying A.  Pt resting in bed with alarm set and all needs met.     Therapy Documentation Precautions:  Precautions Precautions: Fall Precaution Comments: Ataxic Restrictions Weight Bearing Restrictions: (P) No    Vital Signs: Therapy Vitals Temp: 98.2 F (36.8  C) Temp Source: Oral Pulse Rate: 66 Resp: 18 BP: 112/76 Patient Position (if appropriate): Lying Oxygen Therapy SpO2: 98 % O2 Device: Not Delivered Pain: Pain Assessment Pain Assessment: No/denies pain ADL: ADL ADL Comments: Please see functional navigator for ADL status  See Function Navigator for Current Functional Status.   Therapy/Group: Individual Therapy  [place] 11/28/2016, 9:53 AM

## 2016-11-28 NOTE — Progress Notes (Signed)
Nutrition Follow-up  DOCUMENTATION CODES:   Not applicable  INTERVENTION:  Continue Ensure Enlive po BID, each supplement provides 350 kcal and 20 grams of protein.  Encourage adequate PO intake.   NUTRITION DIAGNOSIS:   Increased nutrient needs related to chronic illness as evidenced by estimated needs; ongoing  GOAL:   Patient will meet greater than or equal to 90% of their needs; progressing  MONITOR:   PO intake, Supplement acceptance, Labs, Weight trends, Skin, I & O's  REASON FOR ASSESSMENT:   Malnutrition Screening Tool    ASSESSMENT:   61 y.o. right handed female with history of hypertension, right breast cancer treated with mastectomy subsequent chemotherapyAnd maintained on tamoxifen,  tobacco abuse. Presented 11/19/2016 with progressive vertigo ataxia occasional headaches. MRI obtained showed cerebellar lesion. suboccipital craniectomy for gross total resection of cerebellar tumor 11/21/2016 Postoperative MRI of the brain Showed postoperative changes after resection minimal residual tumor as well as moderate sized acute infarct involving the inferior and medial cerebellar hemispheres.   Meal completion has been varied from 20-60% with 60% at breakfast this AM. Pt currently has Ensure ordered and has been consuming them. RD to continue with current orders to aid in adequate nutrition. Labs and medications reviewed.   Diet Order:  Diet regular Room service appropriate? Yes; Fluid consistency: Thin  Skin:   (Incision on head)  Last BM:  8/26  Height:   Ht Readings from Last 1 Encounters:  11/23/16 5\' 4"  (1.626 m)    Weight:   Wt Readings from Last 1 Encounters:  11/24/16 167 lb 1.7 oz (75.8 kg)    Ideal Body Weight:  54.54 kg  BMI:  Body mass index is 28.68 kg/m.  Estimated Nutritional Needs:   Kcal:  1800-2000  Protein:  85-95 grams  Fluid:  1.8 - 2 L/day  EDUCATION NEEDS:   No education needs identified at this time  Corrin Parker, MS,  RD, LDN Pager # [phone] After hours/ weekend pager # [phone]

## 2016-11-28 NOTE — Progress Notes (Signed)
Patient ID: LOVEAH LIKE, female   DOB: 06/13/1955, 61 y.o.   MRN: [id number] Subjective:  The patient is alert and pleasant. She looks and feels better. The plan is to discharge her next week.  Objective: Vital signs in last 24 hours: Temp:  [98.2 F (36.8 C)] 98.2 F (36.8 C) (08/29 0600) Pulse Rate:  [66-78] 66 (08/29 0600) Resp:  [18] 18 (08/29 0600) BP: (112-119)/(68-76) 112/76 (08/29 0600) SpO2:  [97 %-98 %] 98 % (08/29 0600)  Intake/Output from previous day: 08/28 0701 - 08/29 0700 In: 730 [P.O.:730] Out: -  Intake/Output this shift: Total I/O In: 280 [P.O.:280] Out: -   Physical exam the patient is alert and pleasant. She is moving all 4 extremities well. Cranial nerve exam is grossly normal. Her occipital cervical incision is healing well without signs of infection.  Lab Results:  Recent Labs  11/26/16 0800  WBC 9.2  HGB 12.9  HCT 39.0  PLT 218   BMET  Recent Labs  11/26/16 0800  NA 139  K 3.8  CL 104  CO2 27  GLUCOSE 169*  BUN 17  CREATININE 0.83  CALCIUM 9.2    Studies/Results: No results found.  Assessment/Plan: Status post suboccipital craniectomy for resection of cerebellar metastasis: The patient is doing well. I've instructed her to follow-up with me next week for suture removal and answered all her questions.  LOS: 4 days     Jonh Mcqueary D 11/28/2016, 3:47 PM

## 2016-11-28 NOTE — Consult Note (Signed)
Neuropsychological Consultation   Patient:   Tasha Ortiz   DOB:   11-Aug-1955  MR Number:  [id number]  Location:  [facility] [address] [id number] [place] [address] Dept: [address]: [phone]           Date of Service:   11/28/2016  Start Time:   2:40 PM End Time:   3:45 PM  Provider/Observer:  Ilean Skill, Psy.D.       Clinical Neuropsychologist       Billing Code/Service: [phone] 4 Units  Chief Complaint:    Tasha Ortiz is a 61 year old female who has a history of hypertension and right breast cancer treated with mastectomy and subsequent chemotherapy. She has been maintained on tamoxifen. The patient presented on 11/19/2016 with progressive vertigo, ataxia and occasional headaches. MRI obtained showed cerebellar lesion. The patient underwent suboccipital craniotomy for gross total resection of cerebellar tumor on 11/21/2016. Postoperative MRI showed postoperative changes it also included minimal residual tumor as well as a moderate sized acute infarct involving the inferior and medial cerebellar hemispheres.  Reason for Service:  FLORA PARKS was referred for neuropsychological consultation due to adjustment and coping issues following acute changes in balance and vertigo. With these changes a tumor was identified in her cerebellar region and she underwent neurosurgery for removal of tumor. At this point, pathology is not come back from the tissue. Below is the full history of present illness for the current admission.  HPI: Tasha Sorn Mitchellis a 61 y.o.right handed femalewith history of hypertension, right breast cancer treated with mastectomy subsequent chemotherapyAnd maintained on tamoxifen, tobacco abuse. Patient lives with daughter. Independent prior to admissionand daughter works during the day. Presented 11/19/2016 with progressive vertigo ataxia occasional  headaches. MRI obtained showed cerebellar lesion. CT chest abdomen pelvis negative for any metastatic disease. Underwent suboccipital craniectomy for gross total resection of cerebellar tumor 11/21/2016 per Dr. [name]. Postoperative MRI of the brain Showed postoperative changes after resection minimal residual tumor as well as moderate sized acute infarct involving the inferior and medial cerebellar hemispheres. Decadron protocol as indicated. MRSA PCR screen positive maintained on contact precautions. Postoperative Physical andoccupational therapy evaluationscompleted 11/22/2016 with recommendations of physical medicine rehabilitation consult. Patient was admitted for comprehensive rehabilitation program  Current Status:  The patient reports that initially she was rather thrown by the potential that she has developed metastatic brain cancer. The patient reports that she felt that the previous treatment for her breast cancer including mastectomy would leave her not vulnerable of any type of recurrence or metastasis. The patient reports that she is just now coping with the reality of what may be going on. However, we have not gotten pathology back and at this point there is no definitive answer about what the tumor is or was specifically. The patient reports that she initially had a lot of anxiety and fear responses but she is been stabilizing and denies any current significant anxiety or depressive symptoms.   Behavioral Observation: Tasha Ortiz  presents as a 61 y.o.-year-old Right African American Female who appeared her stated age. her dress was Appropriate and she was Well Groomed and her manners were Appropriate to the situation.  her participation was indicative of Appropriate and Attentive behaviors.  There were physical disabilities noted related to right side weakness primarily described by patient in her hand.  .  she displayed an appropriate level of cooperation and motivation.  Interactions:    Active Appropriate and Attentive  Attention:   within normal limits and attention span and concentration were age appropriate  Memory:   within normal limits; recent and remote memory intact  Visuo-spatial:  abnormal.  While not assessed directly, the patient does describe symptoms consistent with right side visual neglect.  Speech (Volume):  normal  Speech:   normal; normal  Thought Process:  Coherent and Relevant  Though Content:  WNL; not suicidal  Orientation:   person, place, time/date and situation  Judgment:   Good  Planning:   Good  Affect:    Anxious  Mood:    NA  Insight:   Good  Intelligence:   normal  Medical History:   Past Medical History:  Diagnosis Date  . Acid reflux   . Allergy    seasonal  . Breast cancer ([place])    right,no iv or blood on right sid, chemo and radiation 2014  . Heartburn   . Hypercholesteremia    taken off chol meds Dec 2012  . Hypertension   . Hypertension   . Personal history of chemotherapy 05/2012   rt breast  . Personal history of radiation therapy 10/2012   rt breast        Family Med/Psych History:  Family History  Problem Relation Age of Onset  . Hypertension Mother   . Cancer Sister        brain  . Cancer Cousin        breast  . Healthy Father   . Healthy Maternal Grandmother   . Healthy Maternal Grandfather   . Healthy Paternal Grandmother   . Healthy Paternal Grandfather   . Diabetes Daughter   . Colon cancer Neg Hx     Risk of Suicide/Violence: low patient denies any suicidal or homicidal ideation.  Impression/DX:  Tasha Ortiz is a 61 year old female who has a history of hypertension and right breast cancer treated with mastectomy and subsequent chemotherapy. She has been maintained on tamoxifen. The patient presented on 11/19/2016 with progressive vertigo, ataxia and occasional headaches. MRI obtained showed cerebellar lesion. The patient underwent suboccipital craniotomy  for gross total resection of cerebellar tumor on 11/21/2016. Postoperative MRI showed postoperative changes it also included minimal residual tumor as well as a moderate sized acute infarct involving the inferior and medial cerebellar hemispheres.  The patient reports that initially she was rather thrown by the potential that she has developed metastatic brain cancer. The patient reports that she felt that the previous treatment for her breast cancer including mastectomy would leave her not vulnerable of any type of recurrence or metastasis. The patient reports that she is just now coping with the reality of what may be going on. However, we have not gotten pathology back and at this point there is no definitive answer about what the tumor is or was specifically. The patient reports that she initially had a lot of anxiety and fear responses but she is been stabilizing and denies any current significant anxiety or depressive symptoms.   Diagnosis:    Cerebellar tumor ([place]) - Plan: Ambulatory referral to Physical Medicine Rehab         Electronically Signed   _______________________ Ilean Skill, Psy.D.

## 2016-11-28 NOTE — Progress Notes (Signed)
Physical Therapy Session Note  Patient Details  Name: SHANIQUE ASLINGER MRN: [id number] Date of Birth: 1955/12/06  Today's Date: 11/28/2016 PT Individual Time: 0800-0830 PT Individual Time Calculation (min): 30 min   Short Term Goals: Week 1:  PT Short Term Goal 1 (Week 1): STG= LTG due to short LOS  Skilled Therapeutic Interventions/Progress Updates: Pt presented in bed completing breakfast agreeable to therapy. Performed bed mobility mod I with use of features and ambulated to BR with supervision with RW and toilet transfers. Returned to sink after toileting and performed hand hygiene in standing with supervision. Pt ambulated to [facility] gym and performed Dynavision Mode B in standing 60 sec bouts x 4 including decreasing BOS and single LE on 6in step. Pt returned to room and returned to bed to await next session with call bell within reach and needs met.      Therapy Documentation Precautions:  Precautions Precautions: Fall Precaution Comments: Ataxic Restrictions Weight Bearing Restrictions: (P) No General:   Vital Signs:    See Function Navigator for Current Functional Status.   Therapy/Group: Individual Therapy  Roland Lipke  Tavia Stave, PTA  11/28/2016, 10:24 AM

## 2016-11-28 NOTE — Progress Notes (Signed)
[facility] PHYSICAL MEDICINE & REHABILITATION     PROGRESS NOTE    Subjective/Complaints: Up with therapy. Reasonable night.   ROS: pt denies nausea, vomiting, diarrhea, cough, shortness of breath or chest pain   Objective: Vital Signs: Blood pressure 112/76, pulse 66, temperature 98.2 F (36.8 C), temperature source Oral, resp. rate 18, weight 75.8 kg (167 lb 1.7 oz), last menstrual period 08/16/2012, SpO2 98 %. No results found.  Recent Labs  11/26/16 0800  WBC 9.2  HGB 12.9  HCT 39.0  PLT 218    Recent Labs  11/26/16 0800  NA 139  K 3.8  CL 104  GLUCOSE 169*  BUN 17  CREATININE 0.83  CALCIUM 9.2   CBG (last 3)   Recent Labs  11/27/16 1714 11/27/16 2112 11/28/16 0649  GLUCAP 207* 235* 185*    Wt Readings from Last 3 Encounters:  11/24/16 75.8 kg (167 lb 1.7 oz)  11/23/16 75.8 kg (167 lb 3.2 oz)  11/13/16 78 kg (172 lb)    Physical Exam:  Eyes: EOMare normal.  Pupils reactive to light Neck: Normal range of motion. Neck supple. No thyromegalypresent.  Cardiovascular:RRR without murmur. No JVD  .  Respiratory: CTA Bilaterally without wheezes or rales. Normal effort    GI: Soft. Bowel sounds are normal. She exhibits no distension.  Skin Craniotomy site clean and dry with sutures Neurological: She is alert.  Oriented to person place and date of birth--no changes. Continued dysmetria, diplopia. No evidence of dysmetria, left finger-nose to finger or left shoulder shin. Motor strength is 5/5 bilateral deltoid, biceps, triceps, grip, hip flexor, knee extensor, ankle dorsal flexor. Sensation intact to light touch bilateral upper and lower limb as well as facial region. Right>left lateral nystagmus Psyc: pleasant and appropriate  Assessment/Plan: 1. Functional deficits secondary to cerebellar tumor which require 3+ hours per day of interdisciplinary therapy in a comprehensive inpatient rehab setting. Physiatrist is providing close team supervision  and 24 hour management of active medical problems listed below. Physiatrist and rehab team continue to assess barriers to discharge/monitor patient progress toward functional and medical goals.  Function:  Bathing Bathing position   Position: Shower  Bathing parts Body parts bathed by patient: Right arm, Chest, Left arm, Abdomen, Front perineal area, Buttocks, Right upper leg, Left upper leg, Right lower leg, Left lower leg Body parts bathed by helper: Back  Bathing assist Assist Level: Touching or steadying assistance(Pt > 75%)      Upper Body Dressing/Undressing Upper body dressing   What is the patient wearing?: Pull over shirt/dress     Pull over shirt/dress - Perfomed by patient: Thread/unthread right sleeve, Thread/unthread left sleeve, Put head through opening, Pull shirt over trunk          Upper body assist Assist Level: Supervision or verbal cues      Lower Body Dressing/Undressing Lower body dressing   What is the patient wearing?: Underwear, Pants, [employer], Shoes Underwear - Performed by patient: Thread/unthread right underwear leg, Thread/unthread left underwear leg, Pull underwear up/down   Pants- Performed by patient: Thread/unthread right pants leg, Thread/unthread left pants leg, Pull pants up/down   Non-skid slipper socks- Performed by patient: Don/doff right sock, Don/doff left sock       Shoes - Performed by patient: Don/doff right shoe, Don/doff left shoe       TED Hose - Performed by patient: Don/doff right TED hose, Don/doff left TED hose TED Hose - Performed by helper: Don/doff right TED hose, Don/doff  left TED hose  Lower body assist Assist for lower body dressing: Touching or steadying assistance (Pt > 75%)      Toileting Toileting   Toileting steps completed by patient: Adjust clothing prior to toileting, Performs perineal hygiene, Adjust clothing after toileting Toileting steps completed by helper: Adjust clothing prior to toileting,  Performs perineal hygiene, Adjust clothing after toileting (per Lawson Fiscal, NT) Toileting Assistive Devices:  (RW)  Toileting assist Assist level: Supervision or verbal cues   Transfers Chair/bed transfer   Chair/bed transfer method: Stand pivot Chair/bed transfer assist level: Touching or steadying assistance (Pt > 75%) Chair/bed transfer assistive device: Medical sales representative     Max distance: 150 ft  Assist level: Supervision or verbal cues   Wheelchair   Type: Manual Max wheelchair distance: 150 ft Assist Level: Supervision or verbal cues  Cognition Comprehension Comprehension assist level: Follows basic conversation/direction with no assist  Expression Expression assist level: Expresses basic 75 - 89% of the time/requires cueing 10 - 24% of the time. Needs helper to occlude trach/needs to repeat words.  Social Interaction Social Interaction assist level: Interacts appropriately 90% of the time - Needs monitoring or encouragement for participation or interaction.  Problem Solving Problem solving assist level: Solves basic 75 - 89% of the time/requires cueing 10 - 24% of the time  Memory Memory assist level: Recognizes or recalls 50 - 74% of the time/requires cueing 25 - 49% of the time   Medical Problem List and Plan: 1. Right side weakness with gait disordersecondary to right cerebellar mass status post craniotomy 11/21/2016  -PT, OT, SLP  -continue vestibular symptom mgt/acclimation     2. DVT Prophylaxis/Anticoagulation: SCDs. Monitor for any signs of DVT 3. Pain Management: Hydrocodone as needed 4. Mood: Provide emotional support 5. Neuropsych: This patient iscapable of making decisions on herown behalf. 6. Skin/Wound Care: surgical wound clean. Remove sutures soon 7. Fluids/Electrolytes/Nutrition: encourage PO  -intake reasonable.   8.Hypertension. Norvasc 10 mg daily  -bp controlled at present--no changes 9.History of right breast cancer with  mastectomy as well as chemotherapy. Continue tamoxifen daily 10.Hyperlipidemia. Pravachol 11.MRSA PCR screening positive. Contact precautions   LOS (Days) 4 A FACE TO FACE EVALUATION WAS PERFORMED  Meredith Staggers, MD 11/28/2016 9:11 AM

## 2016-11-29 ENCOUNTER — Inpatient Hospital Stay (HOSPITAL_COMMUNITY): Payer: BLUE CROSS/BLUE SHIELD | Admitting: Occupational Therapy

## 2016-11-29 ENCOUNTER — Ambulatory Visit: Payer: BLUE CROSS/BLUE SHIELD | Admitting: Hematology and Oncology

## 2016-11-29 ENCOUNTER — Inpatient Hospital Stay (HOSPITAL_COMMUNITY): Payer: BLUE CROSS/BLUE SHIELD | Admitting: Physical Therapy

## 2016-11-29 LAB — GLUCOSE, CAPILLARY
Glucose-Capillary: 195 mg/dL — ABNORMAL HIGH (ref 65–99)
Glucose-Capillary: 138 mg/dL — ABNORMAL HIGH (ref 65–99)
Glucose-Capillary: 158 mg/dL — ABNORMAL HIGH (ref 65–99)
Glucose-Capillary: 228 mg/dL — ABNORMAL HIGH (ref 65–99)

## 2016-11-29 NOTE — Assessment & Plan Note (Deleted)
Metastatic breast cancer with brain metastases: Cerebellar lesion 2.9 cm in size status post gross total resection and Dr. [name] on 11/21/2016  (Right breast invasive ductal carcinoma multifocal disease ER/PR positive HER-2 negative T2, N1, M0 stage IIB status post mastectomy, adjuvant chemotherapy and radiation now on antiestrogen therapy with tamoxifen since October 2014.)  Tamoxifen toxicities:Patient is tolerating tamoxifen extremely well without any major problems or concerns.  1. Hot flashes are manageable 2. Muscle aches and pains especially in the knees and lower extremities. Patient lost weight and since then her symptoms have improved markedly. She quit eating beef and pork. She is planning to substitute all the sweets with fruits.  CT chest abdomen pelvis: No evidence of other metastatic disease  Treatment plan: Radiation therapy followed by close surveillance with aortic scans. Since all gross disease been resected, I did not recommend systemic therapies other than switching her from tamoxifen to letrozole.

## 2016-11-29 NOTE — Progress Notes (Signed)
Occupational Therapy Session Note  Patient Details  Name: Tasha Ortiz MRN: [id number] Date of Birth: 1955-04-28  Today's Date: 11/29/2016 OT Individual Time: 0703-0800 and 1131-1200 OT Individual Time Calculation (min): 57 min and 29 min   Short Term Goals: Week 1:  OT Short Term Goal 1 (Week 1): Pt will complete toilet transfers with Min A OT Short Term Goal 2 (Week 1): Pt will complete sit<stand during dressing with Min A and LRAD OT Short Term Goal 3 (Week 1): Pt will complete 2 grooming tasks in standing to improve balance    Skilled Therapeutic Interventions/Progress Updates:    Session 1: Upon entering the room, pt supine in bed with no c/o pain this session. Pt agreeable to OT intervention. Pt ambulates to dresser to obtain clothing items with close supervision for balance and min cues for proper technique. Pt ambulating to bathroom for toileting with overall supervision for clothing management, hygiene, and transfer. Pt standing at sink to wash buttocks and peri area with supervision/set up before sitting on EOB to don clothing items. Once dressed, pt ambulating on carpeted surface with RW and close supervision. Pt engaged in 2 sets of 10 stands without use of UEs and controlled sit with supervision and min verbal cues for technique. Pt returning to room at end of session with call bell and all needed items within reach upon exiting the room.   Session 2: Upon entering the room, pt supine in bed awaiting therapist arrival. Pt with no c/o pain this session. Pt ambulating with RW to bathroom for toileting at overall supervision level. Pt standing at sink and washing hands with supervision before exiting the room. Pt ambulates to BI gym for dynavision activity. Pt standing unsupported with lights off and pt reaching across midline and in all planes being able to hit 123 targets in 3 minutes. Reaction time of 1.46 seconds. Pt with no LOB and performed task at supervision level. Pt reports  no dizziness or double vision during task. Pt taking seated rest break and then ambulating back to room without RW and min A for safety with balance. Pt seated on EOB with call bell and all needed items within reach upon exiting the room.   Therapy Documentation Precautions:  Precautions Precautions: Fall Precaution Comments: Ataxic Restrictions Weight Bearing Restrictions: No General:   Vital Signs:   Pain:   ADL: ADL ADL Comments: Please see functional navigator for ADL status Vision   Perception    Praxis   Exercises:   Other Treatments:    See Function Navigator for Current Functional Status.   Therapy/Group: Individual Therapy  Gypsy Decant 11/29/2016, 12:45 PM

## 2016-11-29 NOTE — Progress Notes (Signed)
Physical Therapy Session Note  Patient Details  Name: Tasha Ortiz MRN: [id number] Date of Birth: October 14, 1955  Today's Date: 11/29/2016 PT Individual Time: [phone] and [phone] PT Individual Time Calculation (min): 61 min and 45 min   Short Term Goals: Week 1:  PT Short Term Goal 1 (Week 1): STG= LTG due to short LOS  Skilled Therapeutic Interventions/Progress Updates:  Treatment 1: Pt received in room & agreeable to tx, denying c/o pain. Pt reports her daughter in [place] to move from 2nd floor apartment to first floor apartment to eliminate need for pt to negotiate full flight of stairs at home. Pt ambulated unit<>outside with RW & supervision; pt negotiated uneven terrain (brick, uneven sidewalk, inclines/declines) with cuing for safety and close supervision to simulate community mobility. Back on unit pt utilized nu-step on level 5 x 12 minutes with 4-5 rest breaks 2/2 fatigue; pt utilized all 4 extremities with task focusing on coordination of reciprocal movements and endurance training. Educated pt on energy conservation techniques during activity and when out in community. Provided pt with, and reviewed, energy conservation handout. At end of session pt left sitting on EOB with all needs within reach & alarm set.   Treatment 2: Pt received in room & agreeable to tx, denying c/o pain. Pt ambulated within room & bathroom with RW & supervision. Pt with continent void on toilet and performed peri hygiene and clothing management without assistance. Pt ambulates room>ortho gym with RW & distant supervision. Pt completed floor transfer x 2 with instructional cuing for technique. Therapist educated pt on scenarios in which she should call EMS after a fall & pt able to verbalize these back to therapist. Gait training with [facility] with therapist providing steady<>min assist for balance. Pt also requires cuing to "walk normal" instead of focusing on gait pattern with AD and does better with  this. Discussed transition from RW to [facility] prior to d/c & pt agreeable. At end of session pt left sitting in recliner in room with all needs within reach.    Therapy Documentation Precautions:  Precautions Precautions: Fall Precaution Comments: Ataxic Restrictions Weight Bearing Restrictions: No   See Function Navigator for Current Functional Status.   Therapy/Group: Individual Therapy  Waunita Schooner 11/29/2016, 3:11 PM

## 2016-11-29 NOTE — Progress Notes (Signed)
[place] PHYSICAL MEDICINE & REHABILITATION     PROGRESS NOTE    Subjective/Complaints: Feeling well. Reports less dizziness and headache.   ROS: pt denies nausea, vomiting, diarrhea, cough, shortness of breath or chest pain   Objective: Vital Signs: Blood pressure 133/75, pulse 61, temperature 98.8 F (37.1 C), temperature source Oral, resp. rate 16, weight 75.8 kg (167 lb 1.7 oz), last menstrual period 08/16/2012, SpO2 100 %. No results found. No results for input(s): WBC, HGB, HCT, PLT in the last 72 hours. No results for input(s): NA, K, CL, GLUCOSE, BUN, CREATININE, CALCIUM in the last 72 hours.  Invalid input(s): CO CBG (last 3)   Recent Labs  11/28/16 1712 11/28/16 2054 11/29/16 0647  GLUCAP 205* 266* 195*    Wt Readings from Last 3 Encounters:  11/24/16 75.8 kg (167 lb 1.7 oz)  11/23/16 75.8 kg (167 lb 3.2 oz)  11/13/16 78 kg (172 lb)    Physical Exam:  Eyes: EOMare normal.  Pupils reactive to light Neck: Normal range of motion. Neck supple. No thyromegalypresent.  Cardiovascular:. RRR without murmur. No JVD  .  Respiratory: CTA Bilaterally without wheezes or rales. Normal effort     GI: Soft. Bowel sounds are normal. She exhibits no distension.  Skin Craniotomy site clean and dry with sutures Neurological: She is alert.  Oriented to person place and date of birth--no changes. Continued dysmetria, diplopia. No evidence of dysmetria, left finger-nose to finger or left shoulder shin. Motor strength is 5/5 bilateral deltoid, biceps, triceps, grip, hip flexor, knee extensor, ankle dorsal flexor. Sensation intact to light touch bilateral upper and lower limb as well as facial region. Right>left lateral nystagmus--present Psyc: pleasant and appropriate  Assessment/Plan: 1. Functional deficits secondary to cerebellar tumor which require 3+ hours per day of interdisciplinary therapy in a comprehensive inpatient rehab setting. Physiatrist is providing close  team supervision and 24 hour management of active medical problems listed below. Physiatrist and rehab team continue to assess barriers to discharge/monitor patient progress toward functional and medical goals.  Function:  Bathing Bathing position   Position: Shower  Bathing parts Body parts bathed by patient: Right arm, Chest, Left arm, Abdomen, Front perineal area, Buttocks, Right upper leg, Left upper leg, Right lower leg, Left lower leg Body parts bathed by helper: Back  Bathing assist Assist Level: Supervision or verbal cues      Upper Body Dressing/Undressing Upper body dressing   What is the patient wearing?: Pull over shirt/dress (pull up shirt - jumpsuit)     Pull over shirt/dress - Perfomed by patient: Thread/unthread right sleeve, Thread/unthread left sleeve, Put head through opening, Pull shirt over trunk          Upper body assist Assist Level: Supervision or verbal cues      Lower Body Dressing/Undressing Lower body dressing   What is the patient wearing?: Underwear, Pants, Shoes Underwear - Performed by patient: Thread/unthread right underwear leg, Thread/unthread left underwear leg, Pull underwear up/down   Pants- Performed by patient: Thread/unthread right pants leg, Thread/unthread left pants leg, Pull pants up/down   Non-skid slipper socks- Performed by patient: Don/doff right sock, Don/doff left sock       Shoes - Performed by patient: Don/doff right shoe, Don/doff left shoe       TED Hose - Performed by patient: Don/doff right TED hose, Don/doff left TED hose TED Hose - Performed by helper: Don/doff right TED hose, Don/doff left TED hose  Lower body assist Assist for lower body  dressing: Supervision or verbal cues      Toileting Toileting   Toileting steps completed by patient: Adjust clothing prior to toileting, Performs perineal hygiene, Adjust clothing after toileting Toileting steps completed by helper: Adjust clothing prior to toileting,  Performs perineal hygiene, Adjust clothing after toileting (per Lawson Fiscal, NT) Toileting Assistive Devices:  (RW)  Toileting assist Assist level: Supervision or verbal cues   Transfers Chair/bed transfer   Chair/bed transfer method: Ambulatory Chair/bed transfer assist level: Supervision or verbal cues Chair/bed transfer assistive device: Medical sales representative     Max distance: 125 ft  Assist level: Supervision or verbal cues   Wheelchair   Type: Manual Max wheelchair distance: 150 ft Assist Level: Supervision or verbal cues  Cognition Comprehension Comprehension assist level: Follows basic conversation/direction with no assist  Expression Expression assist level: Expresses basic 75 - 89% of the time/requires cueing 10 - 24% of the time. Needs helper to occlude trach/needs to repeat words.  Social Interaction Social Interaction assist level: Interacts appropriately 90% of the time - Needs monitoring or encouragement for participation or interaction.  Problem Solving Problem solving assist level: Solves basic 75 - 89% of the time/requires cueing 10 - 24% of the time  Memory Memory assist level: Recognizes or recalls 50 - 74% of the time/requires cueing 25 - 49% of the time   Medical Problem List and Plan: 1. Right side weakness with gait disordersecondary to right cerebellar mass status post craniotomy 11/21/2016  -PT, OT, SLP  -some improvement in vestibular symptoms     2. DVT Prophylaxis/Anticoagulation: SCDs. Monitor for any signs of DVT 3. Pain Management: Hydrocodone as needed 4. Mood: Provide emotional support 5. Neuropsych: This patient iscapable of making decisions on herown behalf. 6. Skin/Wound Care: surgical wound clean/dry. Remove sutures tomorrow 7. Fluids/Electrolytes/Nutrition: encourage PO  -intake reasonable.   8.Hypertension. Norvasc 10 mg daily  -bp controlled at present--no changes 9.History of right breast cancer with mastectomy as  well as chemotherapy. Continue tamoxifen daily 10.Hyperlipidemia. Pravachol 11.MRSA PCR screening positive. Contact precautions   LOS (Days) 5 A FACE TO FACE EVALUATION WAS PERFORMED  Meredith Staggers, MD 11/29/2016 9:17 AM

## 2016-11-30 ENCOUNTER — Inpatient Hospital Stay (HOSPITAL_COMMUNITY): Payer: BLUE CROSS/BLUE SHIELD | Admitting: Physical Therapy

## 2016-11-30 ENCOUNTER — Inpatient Hospital Stay (HOSPITAL_COMMUNITY): Payer: BLUE CROSS/BLUE SHIELD | Admitting: Occupational Therapy

## 2016-11-30 LAB — GLUCOSE, CAPILLARY
Glucose-Capillary: 225 mg/dL — ABNORMAL HIGH (ref 65–99)
Glucose-Capillary: 166 mg/dL — ABNORMAL HIGH (ref 65–99)
Glucose-Capillary: 179 mg/dL — ABNORMAL HIGH (ref 65–99)
Glucose-Capillary: 238 mg/dL — ABNORMAL HIGH (ref 65–99)

## 2016-11-30 MED ORDER — DEXAMETHASONE 2 MG PO TABS
2.0000 mg | ORAL_TABLET | Freq: Three times a day (TID) | ORAL | Status: DC
  Administered 2016-11-30 – 2016-12-03 (×9): 2 mg via ORAL
  Filled 2016-11-30 (×9): qty 1

## 2016-11-30 NOTE — Progress Notes (Signed)
Occupational Therapy Session Note  Patient Details  Name: Tasha Ortiz MRN: [id number] Date of Birth: 02-07-1956  Today's Date: 11/30/2016 OT Individual Time: [phone] and [id number] OT Individual Time Calculation (min): 57 min and 50 min   Short Term Goals: Week 1:  OT Short Term Goal 1 (Week 1): Pt will complete toilet transfers with Min A OT Short Term Goal 2 (Week 1): Pt will complete sit<stand during dressing with Min A and LRAD OT Short Term Goal 3 (Week 1): Pt will complete 2 grooming tasks in standing to improve balance   Skilled Therapeutic Interventions/Progress Updates:    Tx focus on balance, functional ambulation with device, problem solving, and endurance during self care tasks.   Pt greeted supine in bed, agreeable to tx. Pt completed supine<sit with HOB flat/no bedrails (per home environment) with supervision. Pt then ambulated with SPC to retrieve clothing items and placed them on bedside chair (with Min A for balance). Pt then ambulated to TTB, required cue to don shower cap prior to bathing. She opted to sit during shower, utilizing lateral leans for pericare. Pt utilizing standing figure 4 for donning gripper socks with Min A, then ambulated with SPC to EOB. Steady assist for UB/LB dressing tasks. Pt donning Teds with instruction and extra time. She then engaged in bedmaking, ambulating around bed with and without SPC with Min A. Dynamic standing/coordination challenges with applying fitted sheet and donning pillow cases in standing. At end of tx pt was left in recliner with all needs within reach.   Pt requiring steady assist for toilet transfers/tasks x2 this session (while voiding B&B).  2nd Session 1:1 tx (50 min) Tx focus on dynamic balance, functional ambulation with device, and coordination during meaningful IADL engagement.   Pt greeted in recliner, ready for tx. She ambulated with [employer] and Min A to therapy apartment to retrieve vacuum cleaner. Pt vacuuming 3  therapy rooms, bending outside of base of support without device and Min A. Discussed importance of continued participation in IADL tasks at home (with daughters present/assisting). OT assist provided for setup, furniture mgt, and chord mgt. Min cues for safety. Pt wrapping up vacuum chord while seated for UE coordination challenges. At end of tx pt ambulated back to room with 1 questioning cue for pathfinding. Pt was left in recliner with all needs within reach.   Therapy Documentation Precautions:  Precautions Precautions: Fall Precaution Comments: Ataxic Restrictions Weight Bearing Restrictions: No Pain: No c/o pain during tx    ADL: ADL ADL Comments: Please see functional navigator for ADL status     See Function Navigator for Current Functional Status.   Therapy/Group: Individual Therapy  Eulanda Dorion A Marvalene Barrett 11/30/2016, 7:38 AM

## 2016-11-30 NOTE — Progress Notes (Signed)
[facility] PHYSICAL MEDICINE & REHABILITATION     PROGRESS NOTE    Subjective/Complaints: No new complaints. Feels well. Up at sink cleaning up. Denies headache  ROS: pt denies nausea, vomiting, diarrhea, cough, shortness of breath or chest pain   Objective: Vital Signs: Blood pressure (!) 100/58, pulse 83, temperature 98.1 F (36.7 C), temperature source Oral, resp. rate 17, weight 75.8 kg (167 lb 1.7 oz), last menstrual period 08/16/2012, SpO2 100 %. No results found. No results for input(s): WBC, HGB, HCT, PLT in the last 72 hours. No results for input(s): NA, K, CL, GLUCOSE, BUN, CREATININE, CALCIUM in the last 72 hours.  Invalid input(s): CO CBG (last 3)   Recent Labs  11/29/16 1714 11/29/16 2119 11/30/16 0650  GLUCAP 158* 228* 225*    Wt Readings from Last 3 Encounters:  11/24/16 75.8 kg (167 lb 1.7 oz)  11/23/16 75.8 kg (167 lb 3.2 oz)  11/13/16 78 kg (172 lb)    Physical Exam:  Eyes: EOMare normal.  Pupils reactive to light Neck: Normal range of motion. Neck supple. No thyromegalypresent.  Cardiovascular:.RRR without murmur. No JVD  .  Respiratory: CTA Bilaterally without wheezes or rales. Normal effort      GI: Soft. Bowel sounds are normal. She exhibits no distension.  Skin Craniotomy site clean and dry with sutures in place Neurological: She is alert.  Oriented to person place and date of birth--no changes. Improved balance. Romberg mildly positive. Motor strength is 5/5 bilateral deltoid, biceps, triceps, grip, hip flexor, knee extensor, ankle dorsal flexor--stable Sensation intact to light touch bilateral upper and lower limb as well as facial region. Psyc: pleasant and appropriate  Assessment/Plan: 1. Functional deficits secondary to cerebellar tumor which require 3+ hours per day of interdisciplinary therapy in a comprehensive inpatient rehab setting. Physiatrist is providing close team supervision and 24 hour management of active medical  problems listed below. Physiatrist and rehab team continue to assess barriers to discharge/monitor patient progress toward functional and medical goals.  Function:  Bathing Bathing position   Position: Shower  Bathing parts Body parts bathed by patient: Right arm, Chest, Left arm, Abdomen, Front perineal area, Buttocks, Right upper leg, Left upper leg, Right lower leg, Left lower leg Body parts bathed by helper: Back  Bathing assist Assist Level: Supervision or verbal cues      Upper Body Dressing/Undressing Upper body dressing   What is the patient wearing?: Pull over shirt/dress     Pull over shirt/dress - Perfomed by patient: Thread/unthread right sleeve, Thread/unthread left sleeve, Put head through opening, Pull shirt over trunk          Upper body assist Assist Level: Supervision or verbal cues      Lower Body Dressing/Undressing Lower body dressing   What is the patient wearing?: Underwear, Pants, Shoes Underwear - Performed by patient: Thread/unthread right underwear leg, Thread/unthread left underwear leg, Pull underwear up/down   Pants- Performed by patient: Thread/unthread right pants leg, Thread/unthread left pants leg, Pull pants up/down   Non-skid slipper socks- Performed by patient: Don/doff right sock, Don/doff left sock       Shoes - Performed by patient: Don/doff right shoe, Don/doff left shoe       TED Hose - Performed by patient: Don/doff right TED hose, Don/doff left TED hose TED Hose - Performed by helper: Don/doff right TED hose, Don/doff left TED hose  Lower body assist Assist for lower body dressing: Supervision or verbal cues      Toileting  Toileting   Toileting steps completed by patient: Adjust clothing prior to toileting, Performs perineal hygiene, Adjust clothing after toileting Toileting steps completed by helper: Adjust clothing prior to toileting, Performs perineal hygiene, Adjust clothing after toileting (per Lawson Fiscal,  NT) Toileting Assistive Devices:  (RW)  Toileting assist Assist level: Supervision or verbal cues   Transfers Chair/bed transfer   Chair/bed transfer method: Ambulatory Chair/bed transfer assist level: Supervision or verbal cues Chair/bed transfer assistive device: Medical sales representative     Max distance: 150 ft Assist level: Touching or steadying assistance (Pt > 75%)   Wheelchair   Type: Manual Max wheelchair distance: 150 ft Assist Level: Supervision or verbal cues  Cognition Comprehension Comprehension assist level: Follows basic conversation/direction with no assist  Expression Expression assist level: Expresses complex 90% of the time/cues < 10% of the time  Social Interaction Social Interaction assist level: Interacts appropriately with others with medication or extra time (anti-anxiety, antidepressant).  Problem Solving Problem solving assist level: Solves basic 75 - 89% of the time/requires cueing 10 - 24% of the time  Memory Memory assist level: Recognizes or recalls 50 - 74% of the time/requires cueing 25 - 49% of the time   Medical Problem List and Plan: 1. Right side weakness with gait disordersecondary to right cerebellar mass status post craniotomy 11/21/2016  -PT, OT, SLP  -continued improvement in blanace and coordination     2. DVT Prophylaxis/Anticoagulation: SCDs. Monitor for any signs of DVT 3. Pain Management: Hydrocodone as needed 4. Mood: Provide emotional support 5. Neuropsych: This patient iscapable of making decisions on herown behalf. 6. Skin/Wound Care: surgical wound clean/dry. Remove sutures tomorrow 7. Fluids/Electrolytes/Nutrition: encourage PO  -intake reasonable.   8.Hypertension. Norvasc 10 mg daily  -bp controlled at present--no changes 9.History of right breast cancer with mastectomy as well as chemotherapy. Continue tamoxifen daily 10.Hyperlipidemia. Pravachol 11.MRSA PCR screening positive. Contact precautions 12.  Hyperglycemia:  -SSI  -weaning decadron. Down to 2mg  q8 beginning today  LOS (Days) 6 A FACE TO FACE EVALUATION WAS PERFORMED  Meredith Staggers, MD 11/30/2016 9:41 AM

## 2016-11-30 NOTE — Progress Notes (Signed)
Physical Therapy Session Note  Patient Details  Name: Tasha Ortiz MRN: [id number] Date of Birth: [birth year]  Today's Date: 11/30/2016 PT Individual Time: 1011-1104 and 1302-1350 PT Individual Time Calculation (min): 53 min and 48 min   Short Term Goals: Week 1:  PT Short Term Goal 1 (Week 1): STG= LTG due to short LOS  Skilled Therapeutic Interventions/Progress Updates:  Treatment 1: Pt received in room & agreeable to tx. No c/o pain reported. Gait training throughout unit with [facility] & steady assist with continued cuing to "walk normal" to progress to more normalized gait pattern. Pt does require instructional cuing to use cane in LUE. Balance, weight shifting, and NMR focused on with pt performing single leg stance and standing cone taps with heavy min assist progressing to steady assist for standing balance. Pt with improving coordination on this date as compared to previous sessions. In hallway pt completed lateral walking and backwards walking without UE support and steady assist for balance with task focusing on dynamic balance, weight shifting, and high level gait. At end of session pt left sitting in recliner in room with all needs within reach.  Treatment 2: Pt received in room & agreeable to tx, no c/o pain reported. Gait training throughout unit with [facility] with pt continuing to require cuing for more normalized gait pattern. Educated pt on need to always hold [place] in LUE and rationale behind this; pt voiced understanding. Pt negotiated obstacle course with steady assist fade to close supervision without cuing for stepping over poles & weaving between cones. Pt utilized Biodex Limits of Stability with BUE>1UE>no UE support with instructional cuing and verbal cuing for technique; task focused on standing balance & weight shifting. Pt used bathroom in room with supervision for transfers, peri care, and clothing management; pt with continent void. At end of session pt left sitting in recliner in  room with all needs within reach.    Therapy Documentation Precautions:  Precautions Precautions: Fall Precaution Comments: Ataxic Restrictions Weight Bearing Restrictions: No   See Function Navigator for Current Functional Status.   Therapy/Group: Individual Therapy  Waunita Schooner 11/30/2016, 2:08 PM

## 2016-11-30 NOTE — Progress Notes (Signed)
Social Work Patient ID: Tasha Ortiz, female   DOB: 04/12/1955, 61 y.o.   MRN: [id number]  Met with daughter's and pt to discuss discharge plan. Daughter has FMLA forms completed by [employer]  She will take a leave and her Elenor Legato will be here this weekend to stay with pt for the first week. Discussed follow up therapies, pt's OP co-pays are $65.00 will need to do home health. Pt feels she is getting to The point she will be mod/i soon. Daughter's very pleased with her progress this week.

## 2016-12-01 ENCOUNTER — Inpatient Hospital Stay (HOSPITAL_COMMUNITY): Payer: BLUE CROSS/BLUE SHIELD | Admitting: Physical Therapy

## 2016-12-01 ENCOUNTER — Inpatient Hospital Stay (HOSPITAL_COMMUNITY): Payer: BLUE CROSS/BLUE SHIELD | Admitting: Occupational Therapy

## 2016-12-01 DIAGNOSIS — T380X5A Adverse effect of glucocorticoids and synthetic analogues, initial encounter: Secondary | ICD-10-CM

## 2016-12-01 DIAGNOSIS — C50411 Malignant neoplasm of upper-outer quadrant of right female breast: Secondary | ICD-10-CM

## 2016-12-01 DIAGNOSIS — C50919 Malignant neoplasm of unspecified site of unspecified female breast: Secondary | ICD-10-CM

## 2016-12-01 DIAGNOSIS — R739 Hyperglycemia, unspecified: Secondary | ICD-10-CM

## 2016-12-01 LAB — GLUCOSE, CAPILLARY
Glucose-Capillary: 194 mg/dL — ABNORMAL HIGH (ref 65–99)
Glucose-Capillary: 107 mg/dL — ABNORMAL HIGH (ref 65–99)
Glucose-Capillary: 166 mg/dL — ABNORMAL HIGH (ref 65–99)
Glucose-Capillary: 208 mg/dL — ABNORMAL HIGH (ref 65–99)

## 2016-12-01 NOTE — Addendum Note (Signed)
Addendum  created 12/01/16 0205 by Belinda Block, MD   Image imported

## 2016-12-01 NOTE — Progress Notes (Addendum)
Physical Therapy Session Note  Patient Details  Name: Tasha Ortiz MRN: [id number] Date of Birth: 1955-04-03  Today's Date: 12/01/2016 PT Individual Time: 8185-6314 and 1401-1455 PT Individual Time Calculation (min): 81 min and 54 min   Short Term Goals: Week 1:  PT Short Term Goal 1 (Week 1): STG= LTG due to short LOS  Skilled Therapeutic Interventions/Progress Updates:  Treatment 1: Pt received in bed & agreeable to tx, denying c/o pain. Pt transferred out of bed with mod I. Pt ambulates throughout room and bathroom with [facility] & supervision. Pt completes toilet transfer with mod I on elevated seat over toilet, and performs peri hygiene and clothing management without assistance. Pt ambulates throughout unit with [facility] & close supervision with less cuing for gait pattern on this date. Pt utilized Biodex Limits of Stability and ball toss game with dynamic base and 1 UE support progressing to no UE support with task focusing on weight shifting and postural control. Pt with accuracy ranges of 15-23% when completing task without UE support and will benefit from further practice. Pt negotiated 16 steps (6") with R rail and supervision to simulate home set up as pt reports her & her daughter no longer plan to move to a first floor apartment and will have to negotiate 13 steps to 2nd floor. Pt utilized cybex kinetron in standing with BUE > 1 UE > no UE support but min assist with task focusing on weight shifting L<>R, NMR, balance, and strengthening. Pt requires rest breaks frequently 2/2 fatigue. Pt utilized nu-step on level 4 x 8 minutes with all 4 extremities with task focusing on coordination of reciprocal movements and endurance training. Pt requires max cuing to pace herself.  Pt reports her daughter will be here this afternoon for PM therapy session. At end of session pt left sitting in recliner in room with all needs within reach.    Treatment 2: Pt received in room with daughter Tasha Ortiz (who lives in  [place]) present for caregiver education. Pt denied c/o pain. Pt ambulates throughout room, bathroom, and unit with [facility] and supervision. Pt is mod I for toilet transfers to elevated seat over commode & with continent void during session. Educated Tasha Ortiz on pt's need for 24 hr supervision, inability to drive until cleared by MD, DME recommendations of SPC & tub transfer bench, HHPT, and home modifications to increase safety (secure or remove throw rugs). Pt completed car transfer at low sedan simulated height, transfer from low, compliant couch, and tub transfer bench with supervision overall and instructional cuing for technique. Pt negotiated steps with R rail with pt & daughter return demonstrating proper positioning. Tasha Ortiz reports she feels comfortable educating pt's other daughter, Tasha Ortiz, whom pt lives with, on all caregiver training. Therapist answered all questions pt & daughter had. At end of session pt left in bed in room with alarm set & daughter present to supervise.   Addendum: Educated pt & daughter on floor transfers and scenarios in which she should call EMS; pt able to verbalize some scenarios without assistance.     Therapy Documentation Precautions:  Precautions Precautions: Fall Precaution Comments: Ataxic Restrictions Weight Bearing Restrictions: No   See Function Navigator for Current Functional Status.   Therapy/Group: Individual Therapy  Waunita Schooner 12/01/2016, 3:06 PM

## 2016-12-01 NOTE — Progress Notes (Signed)
[facility]IC[facility] & REHABILITATION     PROGRESS NOTE    Subjective/Complaints: Patient seen lying in bed this morning. She slept well overnight. She has questions about the results of your pathology report.  ROS: Denies nausea, vomiting, diarrhea, shortness of breath or chest pain   Objective: Vital Signs: Blood pressure 134/70, pulse 61, temperature 98.5 F (36.9 C), resp. rate 18, weight 75.8 kg (167 lb 1.7 oz), last menstrual period 08/16/2012, SpO2 99 %. No results found. No results for input(s): WBC, HGB, HCT, PLT in the last 72 hours. No results for input(s): NA, K, CL, GLUCOSE, BUN, CREATININE, CALCIUM in the last 72 hours.  Invalid input(s): CO CBG (last 3)   Recent Labs  11/30/16 1706 11/30/16 2159 12/01/16 0650  GLUCAP 179* 238* 194*    Wt Readings from Last 3 Encounters:  11/24/16 75.8 kg (167 lb 1.7 oz)  11/23/16 75.8 kg (167 lb 3.2 oz)  11/13/16 78 kg (172 lb)    Physical Exam:  Gen: NAD. Vital signs reviewed. Eyes: EOMare normal. No discharge.  Cardiovascular:.RRR. No JVD  .  Respiratory: CTA Bilaterally. Normal effort      GI: Bowel sounds are normal. She exhibits no distension.  Skin Craniotomy site clean and dry and intact Neurological: She is alert.  Motor: 5/5 throughout Psyc: pleasant and appropriate  Assessment/Plan: 1. Functional deficits secondary to cerebellar tumor which require 3+ hours per day of interdisciplinary therapy in a comprehensive inpatient rehab setting. Physiatrist is providing close team supervision and 24 hour management of active medical problems listed below. Physiatrist and rehab team continue to assess barriers to discharge/monitor patient progress toward functional and medical goals.  Function:  Bathing Bathing position   Position: Shower  Bathing parts Body parts bathed by patient: Right arm, Chest, Left arm, Abdomen, Front perineal area, Buttocks, Right upper leg, Left upper leg, Right lower leg,  Left lower leg Body parts bathed by helper: Back  Bathing assist Assist Level: Supervision or verbal cues      Upper Body Dressing/Undressing Upper body dressing   What is the patient wearing?: Pull over shirt/dress     Pull over shirt/dress - Perfomed by patient: Thread/unthread right sleeve, Thread/unthread left sleeve, Put head through opening, Pull shirt over trunk          Upper body assist Assist Level: Supervision or verbal cues      Lower Body Dressing/Undressing Lower body dressing   What is the patient wearing?: Underwear, Pants, Shoes, [employer]  - Performed by patient: Thread/unthread right underwear leg, Thread/unthread left underwear leg, Pull underwear up/down   Pants- Performed by patient: Thread/unthread right pants leg, Thread/unthread left pants leg, Pull pants up/down   Non-skid slipper socks- Performed by patient: Don/doff right sock, Don/doff left sock       Shoes - Performed by patient: Don/doff right shoe, Don/doff left shoe       TED Hose - Performed by patient: Don/doff right TED hose, Don/doff left TED hose TED Hose - Performed by helper: Don/doff right TED hose, Don/doff left TED hose  Lower body assist Assist for lower body dressing: Touching or steadying assistance (Pt > 75%)      Toileting Toileting   Toileting steps completed by patient: Adjust clothing prior to toileting, Performs perineal hygiene, Adjust clothing after toileting Toileting steps completed by helper: Adjust clothing prior to toileting, Performs perineal hygiene, Adjust clothing after toileting (per Tasha Ortiz, NT) Toileting Assistive Devices:  (RW)  Toileting assist Assist  level: Supervision or verbal cues   Transfers Chair/bed transfer   Chair/bed transfer method: Ambulatory Chair/bed transfer assist level: Supervision or verbal cues Chair/bed transfer assistive device: Medical sales representative     Max distance: >150 ft  Assist level:  Supervision or verbal cues   Wheelchair   Type: Manual Max wheelchair distance: 150 ft Assist Level: Supervision or verbal cues  Cognition Comprehension Comprehension assist level: Follows basic conversation/direction with no assist  Expression Expression assist level: Expresses complex 90% of the time/cues < 10% of the time  Social Interaction Social Interaction assist level: Interacts appropriately with others with medication or extra time (anti-anxiety, antidepressant).  Problem Solving Problem solving assist level: Solves basic 75 - 89% of the time/requires cueing 10 - 24% of the time  Memory Memory assist level: Recognizes or recalls 50 - 74% of the time/requires cueing 25 - 49% of the time   Medical Problem List and Plan: 1. Right side weakness with gait disordersecondary to right cerebellar mass and cerebellar infarct status post craniotomy 11/21/2016  -Continue CIR  Notes reviewed and images reviewed  Pathology showing metastatic ductal CA of breast 2. DVT Prophylaxis/Anticoagulation: SCDs. Monitor for any signs of DVT 3. Pain Management: Hydrocodone as needed 4. Mood: Provide emotional support 5. Neuropsych: This patient iscapable of making decisions on herown behalf. 6. Skin/Wound Care: surgical wound clean/dry.  7. Fluids/Electrolytes/Nutrition: encourage PO  -intake reasonable.   8.Hypertension. Norvasc 10 mg daily  Controlled 9/1 9.History of right breast cancer with mastectomy as well as chemotherapy. Continue tamoxifen daily 10.Hyperlipidemia. Pravachol 11.MRSA PCR screening positive. Contact precautions 12. Steroid induced Hyperglycemia:  -SSI  -weaning decadron. Down to 2mg  q8   LOS (Days) 7 A FACE TO FACE EVALUATION WAS PERFORMED  Tasha Ortiz Tasha Phenix, MD 12/01/2016 9:49 AM

## 2016-12-02 ENCOUNTER — Inpatient Hospital Stay (HOSPITAL_COMMUNITY): Payer: BLUE CROSS/BLUE SHIELD

## 2016-12-02 LAB — GLUCOSE, CAPILLARY
Glucose-Capillary: 136 mg/dL — ABNORMAL HIGH (ref 65–99)
Glucose-Capillary: 151 mg/dL — ABNORMAL HIGH (ref 65–99)
Glucose-Capillary: 242 mg/dL — ABNORMAL HIGH (ref 65–99)
Glucose-Capillary: 186 mg/dL — ABNORMAL HIGH (ref 65–99)

## 2016-12-02 NOTE — Progress Notes (Signed)
[facility] PH[facility] MEDICINE & REHABILITATION     PROGRESS NOTE    Subjective/Complaints: Patient seen lying in bed this morning. She states she slept well overnight. She does not have any complaints this morning.  ROS: Denies nausea, vomiting, diarrhea, shortness of breath or chest pain   Objective: Vital Signs: Blood pressure (!) 152/84, pulse 68, temperature 99.3 F (37.4 C), temperature source Oral, resp. rate 18, weight 75.8 kg (167 lb 1.7 oz), last menstrual period 08/16/2012, SpO2 99 %. No results found. No results for input(s): WBC, HGB, HCT, PLT in the last 72 hours. No results for input(s): NA, K, CL, GLUCOSE, BUN, CREATININE, CALCIUM in the last 72 hours.  Invalid input(s): CO CBG (last 3)   Recent Labs  12/01/16 1645 12/01/16 2047 12/02/16 0656  GLUCAP 166* 208* 136*    Wt Readings from Last 3 Encounters:  11/24/16 75.8 kg (167 lb 1.7 oz)  11/23/16 75.8 kg (167 lb 3.2 oz)  11/13/16 78 kg (172 lb)    Physical Exam:  Gen: NAD. Vital signs reviewed. Eyes: EOMare normal. No discharge.  Cardiovascular: RRR. No JVD  .  Respiratory: CTA Bilaterally. Normal effort      GI: Bowel sounds are normal. She exhibits no distension.  Skin Craniotomy site clean and dry and intact Neurological: She is alert.  Motor: 5/5 throughout (unchanged) Psyc: pleasant and appropriate  Assessment/Plan: 1. Functional deficits secondary to cerebellar tumor which require 3+ hours per day of interdisciplinary therapy in a comprehensive inpatient rehab setting. Physiatrist is providing close team supervision and 24 hour management of active medical problems listed below. Physiatrist and rehab team continue to assess barriers to discharge/monitor patient progress toward functional and medical goals.  Function:  Bathing Bathing position   Position: Shower  Bathing parts Body parts bathed by patient: Right arm, Chest, Left arm, Abdomen, Front perineal area, Buttocks, Right upper leg,  Left upper leg, Right lower leg, Left lower leg Body parts bathed by helper: Back  Bathing assist Assist Level: Supervision or verbal cues      Upper Body Dressing/Undressing Upper body dressing   What is the patient wearing?: Pull over shirt/dress     Pull over shirt/dress - Perfomed by patient: Thread/unthread right sleeve, Thread/unthread left sleeve, Put head through opening, Pull shirt over trunk          Upper body assist Assist Level: Supervision or verbal cues      Lower Body Dressing/Undressing Lower body dressing   What is the patient wearing?: Underwear, Pants, Shoes, [employer]  - Performed by patient: Thread/unthread right underwear leg, Thread/unthread left underwear leg, Pull underwear up/down   Pants- Performed by patient: Thread/unthread right pants leg, Thread/unthread left pants leg, Pull pants up/down   Non-skid slipper socks- Performed by patient: Don/doff right sock, Don/doff left sock       Shoes - Performed by patient: Don/doff right shoe, Don/doff left shoe       TED Hose - Performed by patient: Don/doff right TED hose, Don/doff left TED hose TED Hose - Performed by helper: Don/doff right TED hose, Don/doff left TED hose  Lower body assist Assist for lower body dressing: Touching or steadying assistance (Pt > 75%)      Toileting Toileting   Toileting steps completed by patient: Adjust clothing prior to toileting, Performs perineal hygiene, Adjust clothing after toileting Toileting steps completed by helper: Adjust clothing prior to toileting, Performs perineal hygiene, Adjust clothing after toileting (per Lawson Fiscal, NT) Toileting Assistive Devices:  (  RW)  Toileting assist Assist level: Supervision or verbal cues   Transfers Chair/bed transfer   Chair/bed transfer method: Ambulatory Chair/bed transfer assist level: Supervision or verbal cues Chair/bed transfer assistive device: Medical sales representative     Max distance:  150 ft Assist level: Supervision or verbal cues   Wheelchair   Type: Manual Max wheelchair distance: 150 ft Assist Level: Supervision or verbal cues  Cognition Comprehension Comprehension assist level: Follows basic conversation/direction with no assist  Expression Expression assist level: Expresses complex 90% of the time/cues < 10% of the time  Social Interaction Social Interaction assist level: Interacts appropriately with others with medication or extra time (anti-anxiety, antidepressant).  Problem Solving Problem solving assist level: Solves basic 75 - 89% of the time/requires cueing 10 - 24% of the time  Memory Memory assist level: Recognizes or recalls 50 - 74% of the time/requires cueing 25 - 49% of the time   Medical Problem List and Plan: 1. Right side weakness with gait disordersecondary to right cerebellar mass and cerebellar infarct status post craniotomy 11/21/2016  -Continue CIR  Pathology showing metastatic ductal CA of breast 2. DVT Prophylaxis/Anticoagulation: SCDs. Monitor for any signs of DVT 3. Pain Management: Hydrocodone as needed 4. Mood: Provide emotional support 5. Neuropsych: This patient iscapable of making decisions on herown behalf. 6. Skin/Wound Care: surgical wound clean/dry.  7. Fluids/Electrolytes/Nutrition: encourage PO  -intake reasonable.   8.Hypertension. Norvasc 10 mg daily  Elevated this a.m., otherwise controlled 9.History of right breast cancer with mastectomy as well as chemotherapy. Continue tamoxifen daily 10.Hyperlipidemia. Pravachol 11.MRSA PCR screening positive. Contact precautions 12. Steroid induced Hyperglycemia:  -SSI  -weaning decadron. Down to 2mg  q8   Appears to be improving overall  LOS (Days) 8 A FACE TO FACE EVALUATION WAS PERFORMED  Ankit Lorie Phenix, MD 12/02/2016 8:03 AM

## 2016-12-02 NOTE — Progress Notes (Signed)
Occupational Therapy Session Note  Patient Details  Name: Tasha Ortiz MRN: [id number] Date of Birth: 08/20/1955  Today's Date: 12/02/2016 OT Individual Time: 1015-1112 OT Individual Time Calculation (min): 57 min    Short Term Goals: Week 1:  OT Short Term Goal 1 (Week 1): Pt will complete toilet transfers with Min A OT Short Term Goal 2 (Week 1): Pt will complete sit<stand during dressing with Min A and LRAD OT Short Term Goal 3 (Week 1): Pt will complete 2 grooming tasks in standing to improve balance   Skilled Therapeutic Interventions/Progress Updates:    1;1. Pt requesting to bathe/dress this session at shower level. Pt gathers clothes while using SPC with CGA for balance and ambulates into bathroom. Pt does not require VC to don shower cap. Pt bathes at sit to stand level with supervision using grab bar to steady herself while standing to wash buttocks. Pt requires VC for safety awareness to not stand on soapy wash cloth on floow when drying off. Pt dresses with supervision and no LOB. Pt completes simulated laundry activity loading laundry into/out of washer/dryer with supervision and folding laundry. Pt able to carry laundry basket and ambulate with CGA with VC to increase stride length as pt has tendency to shuffle feet. Discussed importance of having supervision/assistance with laundry tasks as pt has to walk down flight of stairs to reach laundry room. Discussed eventual adaptations to carrying laundry basket to allow pt to have hands free to use railing. Pt requesting to learn ways to strengthen RUE in sair time. Educated pt on importance of incooproation in everyday tasks and went over pink medium soft theraputty HEP with picture handout. Pt able to demonstrate exercises with min VC for technique. Exited session with pt seated in recliner with call light in reach and all needs met.  Therapy Documentation Precautions:  Precautions Precautions: Fall Precaution Comments:  Ataxic Restrictions Weight Bearing Restrictions: No  See Function Navigator for Current Functional Status.   Therapy/Group: Individual Therapy   M  12/02/2016, 12:05 PM 

## 2016-12-03 ENCOUNTER — Inpatient Hospital Stay (HOSPITAL_COMMUNITY): Payer: BLUE CROSS/BLUE SHIELD | Admitting: Occupational Therapy

## 2016-12-03 ENCOUNTER — Inpatient Hospital Stay (HOSPITAL_COMMUNITY): Payer: BLUE CROSS/BLUE SHIELD | Admitting: Physical Therapy

## 2016-12-03 ENCOUNTER — Inpatient Hospital Stay (HOSPITAL_COMMUNITY): Payer: BLUE CROSS/BLUE SHIELD

## 2016-12-03 LAB — CBC WITH DIFFERENTIAL/PLATELET
Basophils Absolute: 0 10*3/uL (ref 0.0–0.1)
Basophils Relative: 0 %
Eosinophils Absolute: 0 10*3/uL (ref 0.0–0.7)
Eosinophils Relative: 0 %
HCT: 34.8 % — ABNORMAL LOW (ref 36.0–46.0)
Hemoglobin: 11.5 g/dL — ABNORMAL LOW (ref 12.0–15.0)
Lymphocytes Relative: 17 %
Lymphs Abs: 1.8 10*3/uL (ref 0.7–4.0)
MCH: 28.9 pg (ref 26.0–34.0)
MCHC: 33 g/dL (ref 30.0–36.0)
MCV: 87.4 fL (ref 78.0–100.0)
Monocytes Absolute: 1.1 10*3/uL — ABNORMAL HIGH (ref 0.1–1.0)
Monocytes Relative: 10 %
Neutro Abs: 8 10*3/uL — ABNORMAL HIGH (ref 1.7–7.7)
Neutrophils Relative %: 73 %
Platelets: 222 10*3/uL (ref 150–400)
RBC: 3.98 MIL/uL (ref 3.87–5.11)
RDW: 12.6 % (ref 11.5–15.5)
WBC: 10.8 10*3/uL — ABNORMAL HIGH (ref 4.0–10.5)

## 2016-12-03 LAB — BASIC METABOLIC PANEL
Anion gap: 9 (ref 5–15)
BUN: 13 mg/dL (ref 6–20)
CO2: 25 mmol/L (ref 22–32)
Calcium: 8.7 mg/dL — ABNORMAL LOW (ref 8.9–10.3)
Chloride: 100 mmol/L — ABNORMAL LOW (ref 101–111)
Creatinine, Ser: 0.94 mg/dL (ref 0.44–1.00)
GFR calc Af Amer: >60 mL/min (ref >60)
GFR calc non Af Amer: >60 mL/min (ref >60)
Glucose, Bld: 196 mg/dL — ABNORMAL HIGH (ref 65–99)
Potassium: 3.7 mmol/L (ref 3.5–5.1)
Sodium: 134 mmol/L — ABNORMAL LOW (ref 135–145)

## 2016-12-03 LAB — URINALYSIS, COMPLETE (UACMP) WITH MICROSCOPIC
Bilirubin Urine: NEGATIVE
Glucose, UA: 50 mg/dL — AB
Hgb urine dipstick: NEGATIVE
Ketones, ur: NEGATIVE mg/dL
Nitrite: NEGATIVE
Protein, ur: NEGATIVE mg/dL
RBC / HPF: NONE SEEN RBC/hpf (ref 0–5)
Specific Gravity, Urine: 1.018 (ref 1.005–1.030)
pH: 7 (ref 5.0–8.0)

## 2016-12-03 LAB — GLUCOSE, CAPILLARY
Glucose-Capillary: 130 mg/dL — ABNORMAL HIGH (ref 65–99)
Glucose-Capillary: 153 mg/dL — ABNORMAL HIGH (ref 65–99)
Glucose-Capillary: 183 mg/dL — ABNORMAL HIGH (ref 65–99)
Glucose-Capillary: 182 mg/dL — ABNORMAL HIGH (ref 65–99)

## 2016-12-03 MED ORDER — LEVETIRACETAM 500 MG/5ML IV SOLN
1000.0000 mg | Freq: Once | INTRAVENOUS | Status: AC
  Administered 2016-12-03: 1000 mg via INTRAVENOUS
  Filled 2016-12-03: qty 10

## 2016-12-03 MED ORDER — SODIUM CHLORIDE 0.9% FLUSH
10.0000 mL | INTRAVENOUS | Status: DC | PRN
  Administered 2016-12-04 – 2016-12-05 (×3): 10 mL
  Filled 2016-12-03 (×3): qty 40

## 2016-12-03 MED ORDER — SODIUM CHLORIDE 0.9 % IV SOLN
INTRAVENOUS | Status: DC
  Administered 2016-12-03: 21:00:00 via INTRAVENOUS

## 2016-12-03 MED ORDER — DEXAMETHASONE 2 MG PO TABS
2.0000 mg | ORAL_TABLET | Freq: Two times a day (BID) | ORAL | Status: DC
  Administered 2016-12-03 – 2016-12-05 (×4): 2 mg via ORAL
  Filled 2016-12-03 (×4): qty 1

## 2016-12-03 MED ORDER — LEVETIRACETAM 500 MG PO TABS
500.0000 mg | ORAL_TABLET | Freq: Two times a day (BID) | ORAL | Status: DC
Start: 2016-12-04 — End: 2016-12-05
  Administered 2016-12-04 – 2016-12-05 (×3): 500 mg via ORAL
  Filled 2016-12-03 (×3): qty 1

## 2016-12-03 MED ORDER — ALTEPLASE 2 MG IJ SOLR
2.0000 mg | Freq: Once | INTRAMUSCULAR | Status: AC
  Administered 2016-12-03: 2 mg
  Filled 2016-12-03: qty 2

## 2016-12-03 NOTE — Progress Notes (Signed)
Physical Therapy Discharge Summary  Patient Details  Name: Tasha Ortiz MRN: [id number] Date of Birth: Nov 22, 1955  Today's Date: 12/03/2016    Patient has met 6 of 6 long term goals due to improved activity tolerance, improved balance, improved postural control, increased strength, ability to compensate for deficits, improved attention, improved awareness and improved coordination.  Patient to discharge at an ambulatory level Supervision with [name].   Patient's care partner (daughters, Herbert Spires & Levada Dy) is independent to provide the necessary physical and cognitive assistance at discharge.  Reasons goals not met: n/a  Recommendation:  Patient will benefit from ongoing skilled PT services in home health setting to continue to advance safe functional mobility, address ongoing impairments in decreased coordination, dynamic balance, gait, endurance, and minimize fall risk.  Equipment: SPC  Reasons for discharge: treatment goals met  Patient/family agrees with progress made and goals achieved: Yes   PT Discharge Precautions/Restrictions Precautions Precautions: Fall Restrictions Weight Bearing Restrictions: No  Vision/Perception  Pt reports she wears glasses for reading only at baseline. Pt reports blurry vision & diplopia since surgery.  Cognition Arousal/Alertness: Awake/alert Orientation Level: Oriented X4  Sensation Coordination Gross Motor Movements are Fluid and Coordinated:  (impaired coordination BLE (R>L))  Motor  Motor Motor - Skilled Clinical Observations: general weakness   Mobility Bed Mobility Bed Mobility: Rolling Left;Rolling Right;Supine to Sit;Sit to Supine Rolling Right: 6: Modified independent (Device/Increase time) Rolling Left: 6: Modified independent (Device/Increase time) Supine to Sit: 6: Modified independent (Device/Increase time) Sit to Supine: 6: Modified independent (Device/Increase time) Transfers Transfers: Yes Sit to Stand: 5:  Supervision Stand to Sit: 5: Supervision  Locomotion  Ambulation Ambulation: Yes Ambulation/Gait Assistance: 5: Supervision Ambulation Distance (Feet): 200 Feet Assistive device: Straight cane Ambulation/Gait Assistance Details: Verbal cues for technique Gait Gait: Yes Gait Pattern: Decreased step length - left;Decreased step length - right;Decreased stride length;Decreased weight shift to left (decreased gait speed) Stairs / Additional Locomotion Stairs: Yes Stairs Assistance: 5: Supervision Stairs Assistance Details: Verbal cues for technique Stair Management Technique: One rail Right Number of Stairs: 16 Height of Stairs: 6 (inches) Ramp: 5: Supervision (ambulatory with SPC) Curb: 4: Min assist (with SPC) Wheelchair Mobility Wheelchair Mobility: No  Balance Balance Balance Assessed: Yes Standardized Balance Assessment Standardized Balance Assessment: Berg Balance Test Berg Balance Test Sit to Stand: Able to stand without using hands and stabilize independently Standing Unsupported: Able to stand 2 minutes with supervision Sitting with Back Unsupported but Feet Supported on Floor or Stool: Able to sit safely and securely 2 minutes Stand to Sit: Sits safely with minimal use of hands Transfers: Able to transfer with verbal cueing and /or supervision Standing Unsupported with Eyes Closed: Able to stand 10 seconds with supervision Standing Ubsupported with Feet Together: Able to place feet together independently and stand for 1 minute with supervision From Standing, Reach Forward with Outstretched Arm: Reaches forward but needs supervision From Standing Position, Pick up Object from Floor: Able to pick up shoe, needs supervision From Standing Position, Turn to Look Behind Over each Shoulder: Needs supervision when turning Turn 360 Degrees: Needs close supervision or verbal cueing (able to complete full turns to both directions with supervision) Standing Unsupported,  Alternately Place Feet on Step/Stool: Able to complete 4 steps without aid or supervision (completes 8 steps with close supervision) Standing Unsupported, One Foot in Front: Able to take small step independently and hold 30 seconds Standing on One Leg: Tries to lift leg/unable to hold 3 seconds but remains standing independently Total  Score: 34   Extremity Assessment  RLE Assessment RLE Assessment: Within Functional Limits LLE Assessment LLE Assessment: Within Functional Limits   See Function Navigator for Current Functional Status.  Waunita Schooner 01/02/2017, 8:13 AM

## 2016-12-03 NOTE — Progress Notes (Signed)
[place] PHYSICAL MEDICINE & REHABILITATION     PROGRESS NOTE    Subjective/Complaints: Pt in bed. Just finished breakfast. Told me she's had fevers.  Objective: Vital Signs: Blood pressure 137/71, pulse 67, temperature (!) 100.4 F (38 C), temperature source Oral, resp. rate 16, weight 75.8 kg (167 lb 1.7 oz), last menstrual period 08/16/2012, SpO2 98 %. No results found. No results for input(s): WBC, HGB, HCT, PLT in the last 72 hours. No results for input(s): NA, K, CL, GLUCOSE, BUN, CREATININE, CALCIUM in the last 72 hours.  Invalid input(s): CO CBG (last 3)   Recent Labs  12/02/16 1631 12/02/16 2125 12/03/16 0645  GLUCAP 242* 186* 130*    Wt Readings from Last 3 Encounters:  11/24/16 75.8 kg (167 lb 1.7 oz)  11/23/16 75.8 kg (167 lb 3.2 oz)  11/13/16 78 kg (172 lb)    Physical Exam:  Gen: NAD. Vital signs reviewed. Eyes: EOMare normal. No discharge.  Cardiovascular: RRR without murmur. No JVD  .  Respiratory: CTA Bilaterally. Normal effort      GI: Bowel sounds are normal. She exhibits no distension.  Skin Craniotomy site clean and dry and intact, sutures out. Skin normal temp Neurological: She is alert.  Motor: 5/5 throughout (unchanged) Psyc: pleasant and appropriate  Assessment/Plan: 1. Functional deficits secondary to cerebellar tumor which require 3+ hours per day of interdisciplinary therapy in a comprehensive inpatient rehab setting. Physiatrist is providing close team supervision and 24 hour management of active medical problems listed below. Physiatrist and rehab team continue to assess barriers to discharge/monitor patient progress toward functional and medical goals.  Function:  Bathing Bathing position   Position: Shower  Bathing parts Body parts bathed by patient: Right arm, Chest, Left arm, Abdomen, Front perineal area, Buttocks, Right upper leg, Left upper leg, Right lower leg, Left lower leg Body parts bathed by helper: Back  Bathing  assist Assist Level: Supervision or verbal cues      Upper Body Dressing/Undressing Upper body dressing   What is the patient wearing?: Pull over shirt/dress     Pull over shirt/dress - Perfomed by patient: Thread/unthread right sleeve, Thread/unthread left sleeve, Put head through opening, Pull shirt over trunk          Upper body assist Assist Level: Supervision or verbal cues      Lower Body Dressing/Undressing Lower body dressing   What is the patient wearing?: Underwear, Pants, Shoes, [employer]  - Performed by patient: Thread/unthread right underwear leg, Thread/unthread left underwear leg, Pull underwear up/down   Pants- Performed by patient: Thread/unthread right pants leg, Thread/unthread left pants leg, Pull pants up/down   Non-skid slipper socks- Performed by patient: Don/doff right sock, Don/doff left sock       Shoes - Performed by patient: Don/doff right shoe, Don/doff left shoe       TED Hose - Performed by patient: Don/doff right TED hose, Don/doff left TED hose TED Hose - Performed by helper: Don/doff right TED hose, Don/doff left TED hose  Lower body assist Assist for lower body dressing: Supervision or verbal cues      Toileting Toileting   Toileting steps completed by patient: Adjust clothing prior to toileting, Performs perineal hygiene, Adjust clothing after toileting Toileting steps completed by helper: Adjust clothing prior to toileting, Performs perineal hygiene, Adjust clothing after toileting (per Lawson Fiscal, NT) Toileting Assistive Devices:  (RW)  Toileting assist Assist level: Supervision or verbal cues   Transfers Chair/bed transfer   Chair/bed  transfer method: Stand pivot Chair/bed transfer assist level: Supervision or verbal cues Chair/bed transfer assistive device: Armrests     Locomotion Ambulation     Max distance: 150 ft Assist level: Supervision or verbal cues   Wheelchair Wheelchair activity did not occur:  N/A Type: Manual Max wheelchair distance: 150 ft Assist Level: Supervision or verbal cues  Cognition Comprehension Comprehension assist level: Follows basic conversation/direction with no assist  Expression Expression assist level: Expresses complex 90% of the time/cues < 10% of the time  Social Interaction Social Interaction assist level: Interacts appropriately with others with medication or extra time (anti-anxiety, antidepressant).  Problem Solving Problem solving assist level: Solves basic 75 - 89% of the time/requires cueing 10 - 24% of the time  Memory Memory assist level: Recognizes or recalls 50 - 74% of the time/requires cueing 25 - 49% of the time   Medical Problem List and Plan: 1. Right side weakness with gait disordersecondary to right cerebellar mass and cerebellar infarct status post craniotomy 11/21/2016  -Continue CIR  Pathology showing metastatic ductal CA of breast--plan per onc/rad-onc  -finalizing dc planning for tomorrow 2. DVT Prophylaxis/Anticoagulation: SCDs. Monitor for any signs of DVT 3. Pain Management: Hydrocodone as needed 4. Mood: Provide emotional support 5. Neuropsych: This patient iscapable of making decisions on herown behalf. 6. Skin/Wound Care: surgical wound clean/dry.  7. Fluids/Electrolytes/Nutrition: encourage PO  -intake reasonable.   8.Hypertension. Norvasc 10 mg daily  Improved control 9.History of right breast cancer with mastectomy as well as chemotherapy. Continue tamoxifen daily 10.Hyperlipidemia. Pravachol 11.MRSA PCR screening positive. Contact precautions 12. Steroid induced Hyperglycemia:  -SSI  -wean decadron to 2mg  q12  13. Low grade temp:   -chest clear, incision normal  -cehck ua/ucx  LOS (Days) 9 A FACE TO FACE EVALUATION WAS PERFORMED  Alger Simons T, MD 12/03/2016 11:38 AM

## 2016-12-03 NOTE — Discharge Instructions (Signed)
Inpatient Rehab Discharge Instructions  Tasha Ortiz Discharge date and time: No discharge date for patient encounter.   Activities/Precautions/ Functional Status: Activity: activity as tolerated Diet: regular diet Wound Care: keep wound clean and dry Functional status:  ___ No restrictions     ___ Walk up steps independently ___ 24/7 supervision/assistance   ___ Walk up steps with assistance ___ Intermittent supervision/assistance  ___ Bathe/dress independently ___ Walk with walker     _x__ Bathe/dress with assistance ___ Walk Independently    ___ Shower independently ___ Walk with assistance    ___ Shower with assistance ___ No alcohol     ___ Return to work/school ________  Special Instructions: No driving or smoking    COMMUNITY REFERRALS UPON DISCHARGE:    Home Health:   PT & OT  Agency:[facility]    [place]   Date of last service:12/04/2016  Medical Equipment/Items [place]   [phone]   My questions have been answered and I understand these instructions. I will adhere to these goals and the provided educational materials after my discharge from the hospital.  Patient/Caregiver Signature _______________________________ Date __________  Clinician Signature _______________________________________ Date __________  Please bring this form and your medication list with you to all your follow-up doctor's appointments.

## 2016-12-03 NOTE — Progress Notes (Addendum)
   12/03/16 1340  What Happened  Was fall witnessed? Yes  Who witnessed fall? RN and PT  Patients activity before fall ambulating-assisted  Point of contact buttocks  Was patient injured? No  Follow Up  MD notified Algis Liming, PA  Time MD notified 1400  Family notified Yes-comment  Time family notified 1340  Additional tests Yes-comment (STAT CT of head and EEG)  Progress note created (see row info) Yes  Adult Fall Risk Assessment  Risk Factor Category (scoring not indicated) High fall risk per protocol (document High fall risk)  Age 61  Fall History: Fall within 6 months prior to admission 0  Elimination; Bowel and/or Urine Incontinence 0  Elimination; Bowel and/or Urine Urgency/Frequency 0  Medications: includes PCA/Opiates, Anti-convulsants, Anti-hypertensives, Diuretics, Hypnotics, Laxatives, Sedatives, and Psychotropics 3  Patient Care Equipment 1  Mobility-Assistance 2  Mobility-Gait 2  Mobility-Sensory Deficit 2  Altered awareness of immediate physical environment 0  Impulsiveness 0  Lack of understanding of one's physical/cognitive limitations 0  Total Score 10  Patient's Fall Risk High Fall Risk (>13 points)  Adult Fall Risk Interventions  Required Bundle Interventions *See Row Information* High fall risk - low, moderate, and high requirements implemented  Additional Interventions Family Supervision;Lap belt while in chair/wheelchair;Use of appropriate toileting equipment (bedpan, BSC, etc.)  Vitals  BP (!) 142/77  Pulse Rate 81  Oxygen Therapy  SpO2 100 %  O2 Device Room Air  Pain Assessment  Pain Assessment Faces  Faces Pain Scale 6  Pain Type Acute pain  Pain Location Head  Pain Descriptors / Indicators Aching  Neurological  Level of Consciousness Alert  Orientation Level Oriented X4  Cognition Follows commands;Memory impairment  Speech Clear  R Hand Grip Moderate  L Hand Grip Moderate   RUE Motor Response Purposeful movement  RUE Sensation Full  sensation  RUE Motor Strength 4  LUE Motor Response Purposeful movement  LUE Sensation Full sensation  LUE Motor Strength 4  RLE Motor Response Purposeful movement  RLE Sensation Full sensation  RLE Motor Strength 4  LLE Motor Response Purposeful movement  LLE Sensation Full sensation  LLE Motor Strength 4  Neuro Symptoms None    Per department director this was not considered an assisted fall. Pt was lowered to the ground with PT due to fatigue and dizziness. Pt ambulated with a cane and no wheel chair or chair close by.  After pt lowered to floor, pt has seizure like activity lasting about 30 secs. RN called RR nurse to assess. VS stable, medication given for a headache and stat CT of head ordered with Stat EEG. Algis Liming, PA notified and new stat labs and scans ordered. Pt with family at bedside resting with no further complaints of dizziness or lightheadedness.  Per daughter, Pt has had this activity in the past following radiation treatment.

## 2016-12-03 NOTE — Progress Notes (Signed)
Patient with seizure v/s syncopal episode this afternoon. Nurse reports that patient with sudden LOC while walking was laid on ground with right sided motor activity and head turn to the right. This lasted about 30 seconds. No B/B incontinence or confusion reported. Currently back to baseline cognitively. Denies prodrome or dizziness. She reports of fatigue due to many visitors last evening. Family reports syncopal episodes in the past due to fatigue after chemo.     Chart reviewed--patient with low grade fevers past 1-2 days as well as reports of poor po intake. She was started on steroid taper on 8/31. Will order EEG to rule out seizures. Follow up CT head to monitor edema. Surgical pathology with metastatic ductal CA of breast. Check labs to rule out dehydration and CXR for work up of infection. Discussed with Neurology who recommended EEG--doubt seizure as patient not post ictal but is at high risk for seizures. He recommended loading with IV keppra and starting 500 mg bid.  Daughter in room updated--had lot of questions regarding Hem/Onc follow up. They seem not to be aware of path report and awaiting NS input. Will discuss with Dr.Swartz in am.

## 2016-12-03 NOTE — Significant Event (Signed)
Rapid Response Event Note  Overview:  Called by Rn for assistance Time Called: 1338 Arrival Time: 8811 Event Type: Neurologic  Initial Focused Assessment:  Called by RN for assistance.  As per RN and PT, patient was working with therapy in the hallway and suddenly became unresponsive.  Physical therapy stated she had seizure like activity, with right side shaking and grunting that lasted about 30 secs. Did not hit head. On my arrival, patient was alert and talking, does not remember episode just remembers feeling tired.   VSS were checked and stable, CBG 183.  Patient states she has a headache.     Interventions:  Patient was lifted off floor via lift and brought back to room.    Plan of Care (if not transferred):  RN to call MD and update on status  Event Summary:  RN to call if assistance needed   at      at          [facility], Harlin Rain

## 2016-12-03 NOTE — Progress Notes (Signed)
Social Work Patient ID: Tasha Ortiz, female   DOB: [birth year], 61 y.o.   MRN: [id number]  Daughter here to attend therapies with pt today in anticipation of discharge tomorrow. Both feels he is doing well and  Ready to go home. Have made referrals to [facility] for home health follow up and equipment. Pt ready for DC tomorrow, between daughter and her sister she will have 24 hr supervision care.

## 2016-12-03 NOTE — Progress Notes (Signed)
Physical Therapy Session Note  Patient Details  Name: Tasha Ortiz MRN: [id number] Date of Birth: 11/01/1955  Today's Date: 12/03/2016 PT Individual Time: 1005-1100 and 5974-1638 PT Individual Time Calculation (min): 55 min and 43 min   Short Term Goals: Week 1:  PT Short Term Goal 1 (Week 1): STG= LTG due to short LOS  Skilled Therapeutic Interventions/Progress Updates:  Treatment 1: Pt received in bed & agreeable to tx, denying c/o pain. Pt's daughter Herbert Spires) in room and neither pt nor daughter report any concerns regarding d/c home. Pt completed all functional mobility grad day activities with [facility] & supervision overall (please see below and functional navigator for further details). Pt ambulates throughout room & bathroom with [facility] & supervision, completing toilet transfer with mod I with continent void & BM. Pt completes floor transfer with supervision and returning to sitting on stable mat table. Pt completed Berg Balance Test & scored [phone]; educated pt on interpretation of score & current fall risk. Patient demonstrates increased fall risk as noted by score of 34/56 on Berg Balance Scale.  (<36= high risk for falls, close to 100%; 37-45 significant >80%; 46-51 moderate >50%; 52-55 lower >25%). At end of session pt left sitting on EOB in room with daughter present to supervise.   During session pt does require min assist and cuing for sequencing and technique to negotiate curb with [facility]; pt reporting "it's scary" and demonstrates impaired balance.    Treatment 2: Pt received in room with daughter, Levada Dy, present for caregiver education. No c/o pain reported. Pt able to ambulate within room & bathroom with RW & supervision. Pt completes toilet transfers and clothing management with mod I; continent void & BM. Pt ambulates room>ortho gym>gym with [facility] & supervision. Pt completes car transfer at sedan simulated height, negotiated ramp with SPC, and negotiated 16 steps with R rail and  supervision overall. Pt requires min assist and cuing for sequencing to negotiate curb with SPC. Therapist provided instructional cuing and demonstration for Levada Dy and she was able to return demonstrate assisting pt with all tasks. Educated pt & daughter on ability to go out into the community (church, grocery store) but reviewed energy conservation techniques and need for close supervision when ambulating over uneven surfaces. When ambulating back to room pt reported fatigue and began requiring more cuing for normalized gait pattern. Pt then needed to hold on to rail to steady herself still endorsing fatigue and reporting need for w/c. Pt began stumbling forward then lowered to knees with PT assistance and pt began seizure like activity. RN aware & arrived immediately and pt assisted to supine on floor. After seizure activity pt able to long sit with assistance and able to recall activities prior; pt then endorsed HA. Pt assisted back to room via maxi move and left in care of RN.     Therapy Documentation Precautions:  Precautions Precautions: Fall Precaution Comments: Ataxic Restrictions Weight Bearing Restrictions: No   See Function Navigator for Current Functional Status.   Therapy/Group: Individual Therapy  Waunita Schooner 12/03/2016, 3:57 PM

## 2016-12-03 NOTE — Progress Notes (Signed)
Occupational Therapy Session Note  Patient Details  Name: Tasha Ortiz MRN: [id number] Date of Birth: 1955/04/25  Today's Date: 12/03/2016 OT Individual Time:  - 9702-6378 Individual Treatment Time Calculation: 57 min   Missed 30 minutes    Skilled Therapeutic Intervention:  Skilled OT session completed with focus on ADL retraining, d/c planning, and family education.  Pt greeted supine in bed. Daughter Lindell Noe present. Pt ambulated with SPC to dresser to gather clothing items, then proceeded into bathroom to remove clothing while on toilet (post voiding). Pt transferring to shower and completing bathing with overall supervision assist from OT/daughter. OT providing education to daughter regarding transfer/ADL safety and recommending for 24/7 supervision during functional tasks. Pt then ambulated to bed to dress, able to don both Ted stockings with setup. After brushing her teeth at the sink, she ambulated down hallway to therapy apartment. Practiced tub shower transfers with daughter providing supervision assist once OT modeled technique. Discussed curtain placement to minimize water spillage and modifications to increase safety during showering. Practiced functional transfers to low couches and armless furniture in therapy apartment and family room. Discussed continuation of meaningful IADL engagement at d/c to maintain current level of functioning (emphasis on always having someone there with her to provide supervision). Pt/daughter verbalized understanding. At end of tx pt was left with daughter and all needs within reach.   2nd Session 1:1 tx (missed 30 min) Pt unable to be seen for scheduled OT session due to medical reasons. Will follow up when pt is medically stable for tx.   Therapy Documentation Precautions:  Precautions Precautions: Fall Precaution Comments: Ataxic Restrictions Weight Bearing Restrictions: No General: General OT Amount of Missed Time: 30 Minutes Vital  Signs: Therapy Vitals Temp: 98.3 F (36.8 C) Temp Source: Oral Pulse Rate: 82 BP: 110/65 Patient Position (if appropriate): Lying Oxygen Therapy SpO2: 97 % O2 Device: Not Delivered Pain: Pain Assessment Pain Assessment: Faces Pain Score: Asleep Faces Pain Scale: Hurts even more Pain Type: Acute pain Pain Location: Head Pain Descriptors / Indicators: Aching Pain Intervention(s): Medication (See eMAR) ADL: ADL ADL Comments: Please see functional navigator for ADL status     See Function Navigator for Current Functional Status.   Therapy/Group: Individual Therapy  Macrina Lehnert A Jax Abdelrahman 12/03/2016, 4:24 PM

## 2016-12-03 NOTE — Progress Notes (Signed)
Temp. 100. Yesterday, 100.4 this AM. Patient without complaint of. Tasha Ortiz A

## 2016-12-04 ENCOUNTER — Telehealth: Payer: Self-pay | Admitting: *Deleted

## 2016-12-04 ENCOUNTER — Inpatient Hospital Stay (HOSPITAL_COMMUNITY): Payer: BLUE CROSS/BLUE SHIELD

## 2016-12-04 ENCOUNTER — Encounter (HOSPITAL_COMMUNITY): Payer: Self-pay

## 2016-12-04 ENCOUNTER — Inpatient Hospital Stay (HOSPITAL_COMMUNITY): Payer: BLUE CROSS/BLUE SHIELD | Admitting: Physical Therapy

## 2016-12-04 ENCOUNTER — Inpatient Hospital Stay (HOSPITAL_COMMUNITY): Payer: BLUE CROSS/BLUE SHIELD | Admitting: Occupational Therapy

## 2016-12-04 ENCOUNTER — Other Ambulatory Visit: Payer: Self-pay

## 2016-12-04 DIAGNOSIS — R569 Unspecified convulsions: Secondary | ICD-10-CM

## 2016-12-04 LAB — GLUCOSE, CAPILLARY
Glucose-Capillary: 182 mg/dL — ABNORMAL HIGH (ref 65–99)
Glucose-Capillary: 175 mg/dL — ABNORMAL HIGH (ref 65–99)
Glucose-Capillary: 147 mg/dL — ABNORMAL HIGH (ref 65–99)
Glucose-Capillary: 223 mg/dL — ABNORMAL HIGH (ref 65–99)
Glucose-Capillary: 165 mg/dL — ABNORMAL HIGH (ref 65–99)

## 2016-12-04 LAB — URINE CULTURE: Culture: <10000 — AB

## 2016-12-04 NOTE — Progress Notes (Signed)
Social Work Patient ID: Tasha Ortiz, female   DOB: [birth year], 61 y.o.   MRN: [id number]  According to MD pt on hold today for discharge, need to do some more tests, since medical issues from yesterday. Pt and family aware of the plan.

## 2016-12-04 NOTE — Progress Notes (Signed)
EEG completed, results pending. 

## 2016-12-04 NOTE — Telephone Encounter (Signed)
"  Tasha Chew PA (cell number [phone]).  This patient has been seen by Dr. Lindi Adie in the past with cerebellar tumor and metastatic breast cancer.  Are there any plans for her at [facility]?  Currently in room [id number]-01  Please call."  Routing call information to collaborative nurse and provider for review.  Further patient communication through collaborative nurse.

## 2016-12-04 NOTE — Procedures (Signed)
ELECTROENCEPHALOGRAM REPORT  Date of Study: 12/04/2016  Patient's Name: Tasha Ortiz MRN: [id number] Date of Birth: March 29, 1956  Referring Provider: Dr. Alger Simons  Clinical History: This is a 61 year old woman with sudden loss of consciousness with right-sided motor activity and head turn to the right  Medications: levETIRAcetam (KEPPRA) tablet 500 mg  acetaminophen (TYLENOL) tablet 650 mg  amLODipine (NORVASC) tablet 10 mg  dexamethasone (DECADRON) tablet 2 mg  docusate sodium (COLACE) capsule 100 mg  feeding supplement (ENSURE ENLIVE) (ENSURE ENLIVE) liquid 237 mL  HYDROcodone-acetaminophen (NORCO/VICODIN) 5-325 MG per tablet 1 tablet  insulin aspart (novoLOG) injection 0-20 Units  insulin aspart (novoLOG) injection 0-5 Units  loratadine (CLARITIN) tablet 10 mg  meclizine (ANTIVERT) tablet 12.5 mg  pantoprazole (PROTONIX) EC tablet 40 mg  pravastatin (PRAVACHOL) tablet 40 mg  senna-docusate (Senokot-S) tablet 1 tablet  sodium chloride flush (NS) 0.9 % injection 10-40 mL  sorbitol 70 % solution 30 mL  tamoxifen (NOLVADEX) tablet 20 mg   Technical Summary: A multichannel digital EEG recording measured by the international 10-20 system with electrodes applied with paste and impedances below 5000 ohms performed in our laboratory with EKG monitoring in an awake and asleep patient.  Hyperventilation and photic stimulation were not performed.  The digital EEG was referentially recorded, reformatted, and digitally filtered in a variety of bipolar and referential montages for optimal display.    Description: The patient is predominantly drowsy and asleep during the recording.  During brief period of maximal wakefulness, there is a symmetric, medium voltage 8 Hz posterior dominant rhythm that attenuates with eye opening.  The record is symmetric.  During drowsiness and sleep, there is an increase in theta slowing of the background.  Vertex waves and symmetric sleep spindles were  seen.  Hyperventilation and photic stimulation were not performed.  There were no epileptiform discharges or electrographic seizures seen.    EKG lead showed occasional irregular rhythm.  Impression: This predominantly awake and asleep EEG is normal.    Clinical Correlation: A normal EEG does not exclude a clinical diagnosis of epilepsy.  Clinical correlation is advised.   Ellouise Newer, M.D.

## 2016-12-04 NOTE — Plan of Care (Signed)
Problem: RH BOWEL ELIMINATION Goal: RH STG MANAGE BOWEL WITH ASSISTANCE STG Manage Bowel with min Assistance.  No bowel movement this shift Goal: RH STG MANAGE BOWEL W/MEDICATION W/ASSISTANCE STG Manage Bowel with Medication min with Assistance.  Outcome: Progressing Refused stool softners this shift  Problem: RH SKIN INTEGRITY Goal: RH STG SKIN FREE OF INFECTION/BREAKDOWN No infection during her rehab stay  Outcome: Progressing No skin infection or breakdown noted, incision to back of head/neck dry and healing well  Problem: RH SAFETY Goal: RH STG ADHERE TO SAFETY PRECAUTIONS W/ASSISTANCE/DEVICE STG Adhere to Safety Precautions With min  Assistance/Device.  Outcome: Progressing No safety issues noted, patient is aware of safety precautions  Problem: RH PAIN MANAGEMENT Goal: RH STG PAIN MANAGED AT OR BELOW PT'S PAIN GOAL Less than 2  Outcome: Progressing Denies pain and discomfort

## 2016-12-04 NOTE — Progress Notes (Signed)
Nutrition Follow-up  DOCUMENTATION CODES:   Not applicable  INTERVENTION:  Continue Ensure Enlive po BID, each supplement provides 350 kcal and 20 grams of protein  Encourage adequate PO intake.   NUTRITION DIAGNOSIS:   Increased nutrient needs related to chronic illness as evidenced by estimated needs; ongoing  GOAL:   Patient will meet greater than or equal to 90% of their needs; met  MONITOR:   PO intake, Supplement acceptance, Labs, Weight trends, Skin, I & O's  REASON FOR ASSESSMENT:   Malnutrition Screening Tool    ASSESSMENT:   61 y.o. right handed female with history of hypertension, right breast cancer treated with mastectomy subsequent chemotherapyAnd maintained on tamoxifen,  tobacco abuse. Presented 11/19/2016 with progressive vertigo ataxia occasional headaches. MRI obtained showed cerebellar lesion. suboccipital craniectomy for gross total resection of cerebellar tumor 11/21/2016 Postoperative MRI of the brain Showed postoperative changes after resection minimal residual tumor as well as moderate sized acute infarct involving the inferior and medial cerebellar hemispheres.   Meal completion has been 75-100%. Intake has been improving. Pt currently has Ensure ordered with varied consumption. RD to continue with current orders to aid in adequate nutrition. Discharge on hold for today due to additional testing as pt had a significant event yesterday of unresponsiveness and seizure like activity.   Diet Order:  Diet regular Room service appropriate? Yes; Fluid consistency: Thin  Skin:   (Incision on head)  Last BM:  9/3  Height:   Ht Readings from Last 1 Encounters:  11/23/16 5' 4"  (1.626 m)    Weight:   Wt Readings from Last 1 Encounters:  11/24/16 167 lb 1.7 oz (75.8 kg)    Ideal Body Weight:  54.54 kg  BMI:  Body mass index is 28.68 kg/m.  Estimated Nutritional Needs:   Kcal:  1800-2000  Protein:  85-95 grams  Fluid:  1.8 - 2  L/day  EDUCATION NEEDS:   No education needs identified at this time  Corrin Parker, MS, RD, LDN Pager # [phone] After hours/ weekend pager # [phone]

## 2016-12-04 NOTE — Progress Notes (Signed)
Occupational Therapy Note  Patient Details  Name: Tasha Ortiz MRN: [id number] Date of Birth: 01/30/1956  Today's Date: 12/04/2016 OT Missed Time: 60 Minutes Missed Time Reason: Patient fatigue  Pt resting in bed upon arrival. Pt surprised that she was scheduled for therapy and pleasantly declined.  Pt stated she would think about shower in the afternoon during scheduled OT session.     Leotis Shames [facility] 12/04/2016, 9:26 AM

## 2016-12-04 NOTE — Progress Notes (Signed)
Physical Therapy Session Note  Patient Details  Name: SHIELA BRUNS MRN: [id number] Date of Birth: 07/30/1955  Today's Date: 12/04/2016  Skilled Therapeutic Interventions/Progress Updates: Pt received in bed with family present; politely declines participation in therapy despite therapist encouraging pt to participate and offering various options for participation. Remained in bed, continue per POC as able.      Therapy Documentation Precautions:  Precautions Precautions: Fall Precaution Comments: Ataxic Restrictions Weight Bearing Restrictions: No General: PT Amount of Missed Time (min): 60 Minutes PT Missed Treatment Reason: Patient fatigue   See Function Navigator for Current Functional Status.   Benjiman Core Tygielski 12/04/2016, 3:05 PM

## 2016-12-04 NOTE — Progress Notes (Signed)
Social Work  Discharge Note  The overall goal for the admission was met for:   Discharge location: [place] [employer]  Length of Stay: Yes-11 DAYS  Discharge activity level: Yes-SUPERVISION LEVEL  Home/community participation: Yes  Services provided included: MD, RD, PT, OT, SLP, RN, CM, TR, Pharmacy, Neuropsych and SW  Financial Services: Private Insurance: [place]  Follow-up services arranged: Home Health: [place] [facility]-PT, OT,RN, DME: [place] and Patient/Family has no preference for HH/DME agencies  Comments (or additional information):DAUGHTER'S HAVE BEEN THROUGH THERAPIES WITH PT AND CAN PROVIDE 24 HR SUPERVISION  Patient/Family verbalized understanding of follow-up arrangements: Yes  Individual responsible for coordination of the follow-up plan: SELF & [name]-DAUGHTER  Confirmed correct DME delivered: Elease Hashimoto 12/04/2016    Elease Hashimoto
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
## 2016-12-04 NOTE — Progress Notes (Signed)
PHYSICAL MEDICINE & REHABILITATION     PROGRESS NOTE    Subjective/Complaints: Pt in bed. Just finished breakfast. Told me she's had fevers.  Objective: Vital Signs: Blood pressure 109/71, pulse 85, temperature 99.7 F (37.6 C), temperature source Oral, resp. rate 16, weight 75.8 kg (167 lb 1.7 oz), last menstrual period 08/16/2012, SpO2 95 %. Dg Chest 2 View  Result Date: 12/03/2016 CLINICAL DATA:  Acute fever and weakness. EXAM: CHEST  2 VIEW COMPARISON:  11/21/2016 FINDINGS: The cardiomediastinal silhouette is unremarkable. A left Port-A-Cath again noted with tip in the region of the azygos vein. There is no evidence of focal airspace disease, pulmonary edema, suspicious pulmonary nodule/mass, pleural effusion, or pneumothorax. No acute bony abnormalities are identified. IMPRESSION: No evidence of acute cardiopulmonary disease. Electronically Signed   By: Margarette Canada M.D.   On: 12/03/2016 16:24   Ct Head Wo Contrast  Result Date: 12/03/2016 CLINICAL DATA:  61 year old female status post resection of central cerebellar tumor on a 11/21/2016. Tumor pathology reveals metastatic ductal carcinoma of the breast. Witnessed seizure today. Headache. EXAM: CT HEAD WITHOUT CONTRAST TECHNIQUE: Contiguous axial images were obtained from the base of the skull through the vertex without intravenous contrast. COMPARISON:  Postoperative brain MRI 11/22/2016 and earlier. FINDINGS: Brain: Improved basilar cistern patency since the preoperative MRI. Midline vermis resection cavity with surrounding cerebellar edema which appears regressed since 11/22/2016. Improved patency of the fourth ventricle. No ventriculomegaly. Near complete resolution of pneumo ventricle, only trace gas now within the left frontal horn and right temporal horn. No acute intracranial hemorrhage identified. No supratentorial mass effect. Stable supratentorial gray-white matter differentiation with no cortically based acute infarct  identified. Vascular: Mild Calcified atherosclerosis at the skull base. Skull: Midline suboccipital craniectomy. Otherwise stable calvarium and visible skullbase since head CT 03/27/2013. No suspicious osseous lesion. Sinuses/Orbits: Visualized paranasal sinuses and mastoids are stable and well pneumatized. Other: Similar volume suboccipital soft tissue fluid/pseudo meningocele tracking inferiorly from the midline suboccipital craniectomy. Overlying superficial scalp soft tissues have a satisfactory postoperative appearance. Visualized orbit soft tissues are within normal limits. IMPRESSION: 1. Improving postoperative appearance of the cerebellum status post midline tumor resection. Improved patency of the fourth ventricle and decreased central cerebellar edema since 11/22/2016. 2. No new intracranial abnormality identified. 3. Stable postoperative changes to the suboccipital soft tissues. Electronically Signed   By: Genevie Ann M.D.   On: 12/03/2016 15:29    Recent Labs  12/03/16 1422  WBC 10.8*  HGB 11.5*  HCT 34.8*  PLT 222    Recent Labs  12/03/16 1422  NA 134*  K 3.7  CL 100*  GLUCOSE 196*  BUN 13  CREATININE 0.94  CALCIUM 8.7*   CBG (last 3)   Recent Labs  12/03/16 1702 12/03/16 2142 12/04/16 0559  GLUCAP 182* 182* 175*    Wt Readings from Last 3 Encounters:  11/24/16 75.8 kg (167 lb 1.7 oz)  11/23/16 75.8 kg (167 lb 3.2 oz)  11/13/16 78 kg (172 lb)    Physical Exam:  Gen: NAD. Vital signs reviewed. Eyes: EOMare normal. No discharge.  Cardiovascular: RRR without murmur. No JVD   .  Respiratory: CTA Bilaterally without wheezes or rales. Normal effort      GI: Bowel sounds are normal. She exhibits no distension.  Skin Craniotomy site clean and dry and intact, sutures out. Skin normal temp Neurological: She is alert.  Motor: 5/5 throughout (unchanged). Ongoing dysmetria, right more than left. Squints at times when she tries  to focus Psyc: pleasant and  appropriate  Assessment/Plan: 1. Functional deficits secondary to cerebellar tumor which require 3+ hours per day of interdisciplinary therapy in a comprehensive inpatient rehab setting. Physiatrist is providing close team supervision and 24 hour management of active medical problems listed below. Physiatrist and rehab team continue to assess barriers to discharge/monitor patient progress toward functional and medical goals.  Function:  Bathing Bathing position   Position: Shower  Bathing parts Body parts bathed by patient: Right arm, Chest, Left arm, Abdomen, Front perineal area, Buttocks, Right upper leg, Left upper leg, Right lower leg, Left lower leg Body parts bathed by helper: Back  Bathing assist Assist Level: Supervision or verbal cues      Upper Body Dressing/Undressing Upper body dressing   What is the patient wearing?: Pull over shirt/dress     Pull over shirt/dress - Perfomed by patient: Thread/unthread right sleeve, Thread/unthread left sleeve, Put head through opening, Pull shirt over trunk          Upper body assist Assist Level: Set up      Lower Body Dressing/Undressing Lower body dressing   What is the patient wearing?: Underwear, Pants, Shoes, [employer]  - Performed by patient: Thread/unthread right underwear leg, Thread/unthread left underwear leg, Pull underwear up/down   Pants- Performed by patient: Thread/unthread right pants leg, Thread/unthread left pants leg, Pull pants up/down   Non-skid slipper socks- Performed by patient: Don/doff right sock, Don/doff left sock       Shoes - Performed by patient: Don/doff right shoe, Don/doff left shoe       TED Hose - Performed by patient: Don/doff right TED hose, Don/doff left TED hose TED Hose - Performed by helper: Don/doff right TED hose, Don/doff left TED hose  Lower body assist Assist for lower body dressing: Supervision or verbal cues      Toileting Toileting   Toileting steps completed  by patient: Adjust clothing prior to toileting, Performs perineal hygiene, Adjust clothing after toileting Toileting steps completed by helper: Adjust clothing prior to toileting, Performs perineal hygiene, Adjust clothing after toileting (per Lawson Fiscal, NT) Toileting Assistive Devices:  (RW)  Toileting assist Assist level: Supervision or verbal cues   Transfers Chair/bed transfer   Chair/bed transfer method: Stand pivot Chair/bed transfer assist level: Supervision or verbal cues Chair/bed transfer assistive device: Armrests     Locomotion Ambulation     Max distance: 150 ft Assist level: Supervision or verbal cues   Wheelchair Wheelchair activity did not occur: N/A Type: Manual Max wheelchair distance: 150 ft Assist Level: Supervision or verbal cues  Cognition Comprehension Comprehension assist level: Follows basic conversation/direction with no assist  Expression Expression assist level: Expresses complex 90% of the time/cues < 10% of the time  Social Interaction Social Interaction assist level: Interacts appropriately with others with medication or extra time (anti-anxiety, antidepressant).  Problem Solving Problem solving assist level: Solves basic 75 - 89% of the time/requires cueing 10 - 24% of the time  Memory Memory assist level: Recognizes or recalls 50 - 74% of the time/requires cueing 25 - 49% of the time   Medical Problem List and Plan: 1. Right side weakness with gait disordersecondary to right cerebellar mass and cerebellar infarct status post craniotomy 11/21/2016  -Continue CIR  -pt with ?syncopal vs seizure episode yesterday. W/u negative so far. EEG pending. No post-ictal symptoms.   -hold discharge for today.   Pathology showing metastatic ductal CA of breast--plan per onc/rad-onc  -reach to onc/hem-onc re:  plan, discussed path with patient today 2. DVT Prophylaxis/Anticoagulation: SCDs. Monitor for any signs of DVT 3. Pain Management: Hydrocodone as  needed 4. Mood: Provide emotional support 5. Neuropsych: This patient iscapable of making decisions on herown behalf. 6. Skin/Wound Care: surgical wound clean/dry.  7. Fluids/Electrolytes/Nutrition: encourage PO  -intake reasonable.   8.Hypertension. Norvasc 10 mg daily  Improved control 9.History of right breast cancer with mastectomy as well as chemotherapy. Continue tamoxifen daily 10.Hyperlipidemia. Pravachol 11.MRSA PCR screening positive. Contact precautions 12. Steroid induced Hyperglycemia:  -SSI  -continue decadron at 2mg  q12  13. Low grade temp:   -UA/CXR/HCT negative, still with low grade temp today. Feels well   LOS (Days) 10 A FACE TO FACE EVALUATION WAS PERFORMED  Meredith Staggers, MD 12/04/2016 9:02 AM

## 2016-12-05 ENCOUNTER — Telehealth: Payer: Self-pay | Admitting: Hematology and Oncology

## 2016-12-05 ENCOUNTER — Inpatient Hospital Stay: Payer: BLUE CROSS/BLUE SHIELD | Admitting: Family

## 2016-12-05 ENCOUNTER — Inpatient Hospital Stay (HOSPITAL_COMMUNITY): Payer: BLUE CROSS/BLUE SHIELD | Admitting: Physical Therapy

## 2016-12-05 LAB — GLUCOSE, CAPILLARY: Glucose-Capillary: 191 mg/dL — ABNORMAL HIGH (ref 65–99)

## 2016-12-05 MED ORDER — PRAVASTATIN SODIUM 40 MG PO TABS: 40.0000 mg | ORAL_TABLET | Freq: Every day | ORAL | 0 refills | Status: DC

## 2016-12-05 MED ORDER — AMLODIPINE BESYLATE 10 MG PO TABS: 10.0000 mg | ORAL_TABLET | Freq: Every day | ORAL | 3 refills | Status: DC

## 2016-12-05 MED ORDER — LEVETIRACETAM 500 MG PO TABS: 500.0000 mg | ORAL_TABLET | Freq: Two times a day (BID) | ORAL | 0 refills | Status: DC

## 2016-12-05 MED ORDER — MECLIZINE HCL 12.5 MG PO TABS: 12.5000 mg | ORAL_TABLET | Freq: Three times a day (TID) | ORAL | 1 refills | Status: DC | PRN

## 2016-12-05 MED ORDER — PANTOPRAZOLE SODIUM 40 MG PO TBEC: 40.0000 mg | DELAYED_RELEASE_TABLET | Freq: Every day | ORAL | 0 refills | Status: DC

## 2016-12-05 MED ORDER — HEPARIN SOD (PORK) LOCK FLUSH 100 UNIT/ML IV SOLN
500.0000 [IU] | INTRAVENOUS | Status: AC | PRN
  Administered 2016-12-05: 500 [IU]

## 2016-12-05 MED ORDER — HYDROCODONE-ACETAMINOPHEN 5-325 MG PO TABS: 1.0000 | ORAL_TABLET | ORAL | 0 refills | Status: DC | PRN

## 2016-12-05 MED ORDER — DOCUSATE SODIUM 100 MG PO CAPS: 100.0000 mg | ORAL_CAPSULE | Freq: Two times a day (BID) | ORAL | 0 refills | Status: DC

## 2016-12-05 MED ORDER — DEXAMETHASONE 2 MG PO TABS: 2.0000 mg | ORAL_TABLET | Freq: Two times a day (BID) | ORAL | 0 refills | Status: DC

## 2016-12-05 MED ORDER — DEXAMETHASONE 2 MG PO TABS: 1.0000 mg | ORAL_TABLET | Freq: Two times a day (BID) | ORAL | Status: DC

## 2016-12-05 NOTE — Telephone Encounter (Signed)
Dr.Gudena spoke with PA yesterday already.

## 2016-12-05 NOTE — Progress Notes (Signed)
Patient and family received discharge instructions from Marlowe Shores, PA-C with verbal understanding. Patient discharged to home with family and belongings.

## 2016-12-05 NOTE — Telephone Encounter (Signed)
lvm to inform pt of 9/7 hospital follow up per sch msg

## 2016-12-05 NOTE — Progress Notes (Signed)
Patient ID: Tasha Ortiz, female   DOB: November 15, 1955, 61 y.o.   MRN: [id number] Subjective:  The patient is alert and pleasant. She looks and feels better. She is going home today. Her family is at the bedside.  Objective: Vital signs in last 24 hours: Temp:  [98.7 F (37.1 C)-99.7 F (37.6 C)] 98.7 F (37.1 C) (09/05 0643) Pulse Rate:  [70-81] 70 (09/05 0643) Resp:  [16-17] 16 (09/05 0643) BP: (113)/(66-75) 113/75 (09/05 0643) SpO2:  [98 %-100 %] 98 % (09/05 0643)  Intake/Output from previous day: 09/04 0701 - 09/05 0700 In: 720 [P.O.:720] Out: -  Intake/Output this shift: No intake/output data recorded.  Physical exam the patient is alert and oriented 3. She is moving all 4 extremities well. Her wound is healing well. The sutures have been removed.  Lab Results:  Recent Labs  12/03/16 1422  WBC 10.8*  HGB 11.5*  HCT 34.8*  PLT 222   BMET  Recent Labs  12/03/16 1422  NA 134*  K 3.7  CL 100*  CO2 25  GLUCOSE 196*  BUN 13  CREATININE 0.94  CALCIUM 8.7*    Studies/Results: Dg Chest 2 View  Result Date: 12/03/2016 CLINICAL DATA:  Acute fever and weakness. EXAM: CHEST  2 VIEW COMPARISON:  11/21/2016 FINDINGS: The cardiomediastinal silhouette is unremarkable. A left Port-A-Cath again noted with tip in the region of the azygos vein. There is no evidence of focal airspace disease, pulmonary edema, suspicious pulmonary nodule/mass, pleural effusion, or pneumothorax. No acute bony abnormalities are identified. IMPRESSION: No evidence of acute cardiopulmonary disease. Electronically Signed   By: Margarette Canada M.D.   On: 12/03/2016 16:24   Ct Head Wo Contrast  Result Date: 12/03/2016 CLINICAL DATA:  61 year old female status post resection of central cerebellar tumor on a 11/21/2016. Tumor pathology reveals metastatic ductal carcinoma of the breast. Witnessed seizure today. Headache. EXAM: CT HEAD WITHOUT CONTRAST TECHNIQUE: Contiguous axial images were obtained from the  base of the skull through the vertex without intravenous contrast. COMPARISON:  Postoperative brain MRI 11/22/2016 and earlier. FINDINGS: Brain: Improved basilar cistern patency since the preoperative MRI. Midline vermis resection cavity with surrounding cerebellar edema which appears regressed since 11/22/2016. Improved patency of the fourth ventricle. No ventriculomegaly. Near complete resolution of pneumo ventricle, only trace gas now within the left frontal horn and right temporal horn. No acute intracranial hemorrhage identified. No supratentorial mass effect. Stable supratentorial gray-white matter differentiation with no cortically based acute infarct identified. Vascular: Mild Calcified atherosclerosis at the skull base. Skull: Midline suboccipital craniectomy. Otherwise stable calvarium and visible skullbase since head CT 03/27/2013. No suspicious osseous lesion. Sinuses/Orbits: Visualized paranasal sinuses and mastoids are stable and well pneumatized. Other: Similar volume suboccipital soft tissue fluid/pseudo meningocele tracking inferiorly from the midline suboccipital craniectomy. Overlying superficial scalp soft tissues have a satisfactory postoperative appearance. Visualized orbit soft tissues are within normal limits. IMPRESSION: 1. Improving postoperative appearance of the cerebellum status post midline tumor resection. Improved patency of the fourth ventricle and decreased central cerebellar edema since 11/22/2016. 2. No new intracranial abnormality identified. 3. Stable postoperative changes to the suboccipital soft tissues. Electronically Signed   By: Genevie Ann M.D.   On: 12/03/2016 15:29    Assessment/Plan: Status post suboccipital craniectomy for cerebellar metastasis: The patient is doing well. I have instructed her to schedule a follow-up appointment to see me in the office in a couple weeks. I have answered all her questions.  LOS: 11 days  Caelin Rosen D 12/05/2016, 10:18  AM

## 2016-12-05 NOTE — Discharge Summary (Signed)
Discharge summary job [id number]

## 2016-12-05 NOTE — Discharge Summary (Signed)
NAME:  Tasha Ortiz, Tasha Ortiz                 ACCOUNT NO.:  MEDICAL RECORD NO.:  [id number]  LOCATION:                                 FACILITY:  PHYSICIAN:  Meredith Staggers, M.D.DATE OF BIRTH:  October 09, 1955  DATE OF ADMISSION:  11/26/2016 DATE OF DISCHARGE:  12/05/2016                              DISCHARGE SUMMARY   DISCHARGE DIAGNOSES: 1. Right cerebellar mass and cerebellar infarction, status post     craniotomy, November 21, 2016. 2. SCDs for DVT prophylaxis. 3. Hypertension. 4. History of breast cancer with mastectomy as well as chemotherapy. 5. Hyperlipidemia. 6. MRSA PCR screening positive. 7. Steroid-induced hyperglycemia.  HISTORY OF PRESENT ILLNESS:  This is a 61 year old right-handed female with history of hypertension, breast cancer with mastectomy, subsequent chemotherapy, maintained on tamoxifen, and tobacco abuse.  Lives with daughter, independent prior to admission.  Presented, November 19, 2016, with progressive vertigo, ataxia, and occasional headaches.  MRI showed cerebellar lesion.  CT of chest, abdomen, and pelvis negative for any metastatic disease.  Underwent suboccipital craniectomy for gross total resection of cerebellar tumor, November 21, 2016, per Dr. [name]. Postoperative MRI showed postoperative changes after resection, minimal residual tumor, moderate-sized acute infarct involving the inferior and medial cerebellar hemisphere.  Decadron protocol indicated.  MRSA PCR screening positive.  Maintained on contact precautions.  Physical and occupational therapy ongoing.  The patient was admitted for a comprehensive rehab program.  PAST MEDICAL HISTORY:  See discharge diagnoses.  SOCIAL HISTORY:  Lives with daughter, independent prior to admission.  FUNCTIONAL STATUS UPON ADMISSION TO REHAB SERVICES:  Minimal assist, 7 feet rolling walker; moderate assist, sit to stand; min-to-mod assist, activities daily living.  PHYSICAL EXAMINATION:  VITAL SIGNS:  Blood  pressure 136/86, pulse 81, temperature 98, respirations 17. GENERAL:  This was an alert female.  Followed commands.  Mood was flat, but appropriate.  EOMs intact. NECK:  Supple.  Nontender.  No JVD. CARDIAC:  Rate controlled. ABDOMEN:  Soft, nontender.  Good bowel sounds.  REHABILITATION HOSPITAL COURSE:  The patient was admitted to Inpatient Rehab Services.  Therapies initiated on a 3-hour daily basis, consisting of physical therapy, occupational therapy, and rehabilitation nursing. The following issues were addressed during the patient's rehabilitation stay.  Pertaining to Ms. Bell's right cerebellar mass, cerebellar infarction, pathology report consistent with metastatic ductal cancer of breast.  Plan per Radiation Oncology Services.  She would follow up outpatient with Hematology/Oncology.  SCDs for DVT prophylaxis.  Blood pressures controlled on Norvasc.  Pravachol for hyperlipidemia.  Contact precautions for MRSA PCR screening positive.  Monitoring of blood sugars while on Decadron protocol.  The patient during her rehabilitation stay, questionable episode of syncope versus seizure.  EEG negative.  Workup negative.  No postictal symptoms.  She continued to be monitored.  The patient received weekly collaborative interdisciplinary team conferences to discuss estimated length of stay, family teaching, any barriers to her discharge.  Working with energy conservation techniques, completed all functional mobility activities with a straight-point cane and supervision overall.  Ambulates throughout her room and bathroom straight point cane and supervision.  Completed toilet transfers, modified independent.  She could gather her belongings for activities of daily living  and homemaking.  Full family teaching was completed and planned discharge to home.  DISCHARGE MEDICATIONS: 1. Norvasc 10 mg p.o. daily. 2. Decadron taper as indicated. 3. Colace 100 mg p.o. b.i.d. 4. Keppra 500 mg  p.o. b.i.d. 5. Claritin 10 mg p.o. daily. 6. Protonix 40 mg p.o. daily. 7. Pravachol 40 mg p.o. daily. 8. Tamoxifen 20 mg p.o. daily. 9. Hydrocodone 1 tablet every 4 hours as needed pain.  DIET:  Her diet was regular.  FOLLOWUP:  She would follow up with Dr. Alger Simons at the Outpatient Rehab Service Office as indicated.  Dr. Newman Pies, call for appointment.  Oncology Services, latest to see Dr. Lindi Adie, Dr. Mauricio Po.     Lauraine Rinne, P.A.   ______________________________ Meredith Staggers, M.D.    DA/MEDQ  D:  12/05/2016  T:  12/05/2016  Job:  [id number]  cc:   Dr. Mauricio Po Dr. Patterson Hammersmith, M.D.

## 2016-12-05 NOTE — Progress Notes (Signed)
[facility]     PROGRESS NOTE    Subjective/Complaints: No problems over night. Anxious to go home. Denies fever  ROS: pt denies nausea, vomiting, diarrhea, cough, shortness of breath or chest pain    Objective: Vital Signs: Blood pressure 113/75, pulse 70, temperature 98.7 F (37.1 C), temperature source Oral, resp. rate 16, weight 75.8 kg (167 lb 1.7 oz), last menstrual period 08/16/2012, SpO2 98 %. Dg Chest 2 View  Result Date: 12/03/2016 CLINICAL DATA:  Acute fever and weakness. EXAM: CHEST  2 VIEW COMPARISON:  11/21/2016 FINDINGS: The cardiomediastinal silhouette is unremarkable. A left Port-A-Cath again noted with tip in the region of the azygos vein. There is no evidence of focal airspace disease, pulmonary edema, suspicious pulmonary nodule/mass, pleural effusion, or pneumothorax. No acute bony abnormalities are identified. IMPRESSION: No evidence of acute cardiopulmonary disease. Electronically Signed   By: Margarette Canada M.D.   On: 12/03/2016 16:24   Ct Head Wo Contrast  Result Date: 12/03/2016 CLINICAL DATA:  61 year old female status post resection of central cerebellar tumor on a 11/21/2016. Tumor pathology reveals metastatic ductal carcinoma of the breast. Witnessed seizure today. Headache. EXAM: CT HEAD WITHOUT CONTRAST TECHNIQUE: Contiguous axial images were obtained from the base of the skull through the vertex without intravenous contrast. COMPARISON:  Postoperative brain MRI 11/22/2016 and earlier. FINDINGS: Brain: Improved basilar cistern patency since the preoperative MRI. Midline vermis resection cavity with surrounding cerebellar edema which appears regressed since 11/22/2016. Improved patency of the fourth ventricle. No ventriculomegaly. Near complete resolution of pneumo ventricle, only trace gas now within the left frontal horn and right temporal horn. No acute intracranial hemorrhage identified. No supratentorial mass effect. Stable  supratentorial gray-white matter differentiation with no cortically based acute infarct identified. Vascular: Mild Calcified atherosclerosis at the skull base. Skull: Midline suboccipital craniectomy. Otherwise stable calvarium and visible skullbase since head CT 03/27/2013. No suspicious osseous lesion. Sinuses/Orbits: Visualized paranasal sinuses and mastoids are stable and well pneumatized. Other: Similar volume suboccipital soft tissue fluid/pseudo meningocele tracking inferiorly from the midline suboccipital craniectomy. Overlying superficial scalp soft tissues have a satisfactory postoperative appearance. Visualized orbit soft tissues are within normal limits. IMPRESSION: 1. Improving postoperative appearance of the cerebellum status post midline tumor resection. Improved patency of the fourth ventricle and decreased central cerebellar edema since 11/22/2016. 2. No new intracranial abnormality identified. 3. Stable postoperative changes to the suboccipital soft tissues. Electronically Signed   By: Genevie Ann M.D.   On: 12/03/2016 15:29    Recent Labs  12/03/16 1422  WBC 10.8*  HGB 11.5*  HCT 34.8*  PLT 222    Recent Labs  12/03/16 1422  NA 134*  K 3.7  CL 100*  GLUCOSE 196*  BUN 13  CREATININE 0.94  CALCIUM 8.7*   CBG (last 3)   Recent Labs  12/04/16 1648 12/04/16 2041 12/05/16 0640  GLUCAP 223* 165* 191*    Wt Readings from Last 3 Encounters:  11/24/16 75.8 kg (167 lb 1.7 oz)  11/23/16 75.8 kg (167 lb 3.2 oz)  11/13/16 78 kg (172 lb)    Physical Exam:  Gen: NAD. Vital signs reviewed. Eyes: EOMare normal. No discharge.  Cardiovascular: RRR without murmur. No JVD    .  Respiratory:CTA Bilaterally without wheezes or rales. Normal effort t      GI: Bowel sounds are normal. She exhibits no distension.  Skin Craniotomy site clean and dry and intact, sutures out. Skin normal temp Neurological: She is alert.  Motor: 5/5 throughout (unchanged). Ongoing dysmetria R>L.   Psyc: pleasant and appropriate  Assessment/Plan: 1. Functional deficits secondary to cerebellar tumor which require 3+ hours per day of interdisciplinary therapy in a comprehensive inpatient rehab setting. Physiatrist is providing close team supervision and 24 hour management of active medical problems listed below. Physiatrist and rehab team continue to assess barriers to discharge/monitor patient progress toward functional and medical goals.  Function:  Bathing Bathing position   Position: Shower  Bathing parts Body parts bathed by patient: Right arm, Chest, Left arm, Abdomen, Front perineal area, Buttocks, Right upper leg, Left upper leg, Right lower leg, Left lower leg Body parts bathed by helper: Back  Bathing assist Assist Level: Supervision or verbal cues      Upper Body Dressing/Undressing Upper body dressing   What is the patient wearing?: Pull over shirt/dress     Pull over shirt/dress - Perfomed by patient: Thread/unthread right sleeve, Thread/unthread left sleeve, Put head through opening, Pull shirt over trunk          Upper body assist Assist Level: Set up      Lower Body Dressing/Undressing Lower body dressing   What is the patient wearing?: Underwear, Pants, Shoes, [employer]  - Performed by patient: Thread/unthread right underwear leg, Thread/unthread left underwear leg, Pull underwear up/down   Pants- Performed by patient: Thread/unthread right pants leg, Thread/unthread left pants leg, Pull pants up/down   Non-skid slipper socks- Performed by patient: Don/doff right sock, Don/doff left sock       Shoes - Performed by patient: Don/doff right shoe, Don/doff left shoe       TED Hose - Performed by patient: Don/doff right TED hose, Don/doff left TED hose TED Hose - Performed by helper: Don/doff right TED hose, Don/doff left TED hose  Lower body assist Assist for lower body dressing: Supervision or verbal cues      Toileting Toileting    Toileting steps completed by patient: Adjust clothing prior to toileting, Performs perineal hygiene, Adjust clothing after toileting Toileting steps completed by helper: Adjust clothing prior to toileting, Performs perineal hygiene, Adjust clothing after toileting (per Lawson Fiscal, NT) Toileting Assistive Devices:  (RW)  Toileting assist Assist level: Supervision or verbal cues   Transfers Chair/bed transfer   Chair/bed transfer method: Stand pivot Chair/bed transfer assist level: Supervision or verbal cues Chair/bed transfer assistive device: Armrests     Locomotion Ambulation     Max distance: 150 ft Assist level: Supervision or verbal cues   Wheelchair Wheelchair activity did not occur: N/A Type: Manual Max wheelchair distance: 150 ft Assist Level: Supervision or verbal cues  Cognition Comprehension Comprehension assist level: Follows basic conversation/direction with no assist  Expression Expression assist level: Expresses complex 90% of the time/cues < 10% of the time  Social Interaction Social Interaction assist level: Interacts appropriately with others with medication or extra time (anti-anxiety, antidepressant).  Problem Solving Problem solving assist level: Solves basic 75 - 89% of the time/requires cueing 10 - 24% of the time  Memory Memory assist level: Recognizes or recalls 50 - 74% of the time/requires cueing 25 - 49% of the time   Medical Problem List and Plan: 1. Right side weakness with gait disordersecondary to right cerebellar mass and cerebellar infarct status post craniotomy 11/21/2016  -Continue CIR  -pt with ?syncopal vs seizure episode on Monday. EEEG negative  -may dc home today   -plan is for XRT only  -Patient to see Rehab MD in the office for  transitional care encounter in 1-2 weeks.  2. DVT Prophylaxis/Anticoagulation: ambulate  3. Pain Management: Hydrocodone as needed 4. Mood: Provide emotional support 5. Neuropsych: This patient iscapable  of making decisions on herown behalf. 6. Skin/Wound Care: surgical wound clean/dry.  7. Fluids/Electrolytes/Nutrition: encourage PO  -intake reasonable.   8.Hypertension. Norvasc 10 mg daily  Improved control 9.History of right breast cancer with mastectomy as well as chemotherapy. Continue tamoxifen daily 10.Hyperlipidemia. Pravachol 11.MRSA PCR screening positive. Contact precautions 12. Steroid induced Hyperglycemia:sugars improving  -SSI  -continue decadron at 2mg  q12---slow taper as outpt  13. Low grade temp:   -UA/CXR/HCT negative, afebrile today   LOS (Days) 11 A FACE TO FACE EVALUATION WAS PERFORMED  Alger Simons T, MD 12/05/2016 10:21 AM

## 2016-12-06 ENCOUNTER — Other Ambulatory Visit: Payer: Self-pay

## 2016-12-06 ENCOUNTER — Encounter: Payer: Self-pay | Admitting: Radiation Oncology

## 2016-12-06 DIAGNOSIS — C7931 Secondary malignant neoplasm of brain: Principal | ICD-10-CM

## 2016-12-06 DIAGNOSIS — C50911 Malignant neoplasm of unspecified site of right female breast: Secondary | ICD-10-CM

## 2016-12-07 ENCOUNTER — Telehealth: Payer: Self-pay | Admitting: *Deleted

## 2016-12-07 ENCOUNTER — Inpatient Hospital Stay: Payer: BLUE CROSS/BLUE SHIELD | Admitting: Hematology and Oncology

## 2016-12-07 ENCOUNTER — Telehealth: Payer: Self-pay

## 2016-12-07 NOTE — Telephone Encounter (Signed)
"  Someone from this number called and did not leave a message.  Who called and why?"  No documented call information found at this time.  Automated appointment reminder delivers calls 72 hours in advance.  Appointment information provided, reqesting arrive at 8:00 to register for Radiation appointments and return at 11:30 to register for Medical Oncology.  Denies more questions or needs at this time.  "I will be there Tuesday, 12-11-2016 for 8:30 RT and 12:00 Medical Oncology appointments."

## 2016-12-07 NOTE — Progress Notes (Deleted)
Patient Care Team: Golden Circle, FNP as PCP - General (Family Medicine)  DIAGNOSIS:  Encounter Diagnosis  Name Primary?  . Primary cancer of upper outer quadrant of right female breast ([place])     SUMMARY OF ONCOLOGIC HISTORY:   Primary cancer of upper outer quadrant of right female breast ([place])   04/16/2012 Initial Biopsy    Right breast mass biopsy invasive ductal carcinoma with abundant mucin in all the 3 masses, right axillary lymph node biopsy positive for IDC, ER 100% PR percent Ki-67 19%, HER-2 negative ratio 0.94      04/21/2012 Breast MRI    Right breast 3 masses 11o'clock position 1.8 cm, now 11:00 position 2 cm, and 10:00 position 2.3 cm with an abnormal level I right axillary lymph node 3 cm      05/20/2012 Surgery    Right breast mastectomy invasive ductal carcinoma grade 2; 1.9 cm, 2.5 cm, 1.8 cm, 1/17 LN positive, second and third massive grade 3      06/24/2012 - 10/07/2012 Chemotherapy    Adjuvant chemotherapy with Taxotere Cytoxan x6 cycles      10/16/2012 - 12/16/2012 Radiation Therapy    Adjuvant radiation therapy by Dr. [name]      01/07/2013 -  Anti-estrogen oral therapy    Tamoxifen 20 mg daily      11/19/2016 Imaging    MRI brain: 2.9 similar mass within the inferior vermis with surrounding edema and local mass effect partially effacing the fourth ventricle.       11/21/2016 Relapse/Recurrence    Brain biopsy: Metastatic ductal carcinoma the breast positive for ER PR, GATA 3, negative for PAX8 and TTF-1      11/21/2016 Surgery    Suboccipital craniotomy for gross total resection of cerebellar tumor by Dr. [name]       CHIEF COMPLIANT: Follow-up of recent craniotomy for static breast cancer with solitary brain metastases  INTERVAL HISTORY: Tasha Ortiz is a 61 year old with above-mentioned history of breast cancer who was on tamoxifen therapy and presented to the hospital with a 2.9 cm mass within the inferior vermis with surrounding  edema and local mass effect on the fourth ventricle. She had the resection of this mass on 11/21/2016 on final pathology came back as metastatic breast cancer that is positive for ER and PR. Her previous tumor was also ER/PR positive and HER-2 negative. CT scans did not show any evidence of systemic disease. After discharge from the hospital she has been in the rehabilitation and appears to be improving significantly in her functional status. She is here today to discuss further treatment for the metastatic disease. She has seen radiation therapy already.  REVIEW OF SYSTEMS:   Constitutional: Denies fevers, chills or abnormal weight loss Eyes: Denies blurriness of vision Ears, nose, mouth, throat, and face: Denies mucositis or sore throat Respiratory: Denies cough, dyspnea or wheezes Cardiovascular: Denies palpitation, chest discomfort Gastrointestinal:  Denies nausea, heartburn or change in bowel habits Skin: Denies abnormal skin rashes Lymphatics: Denies new lymphadenopathy or easy bruising Neurological: Marked improvement in her strength and energy levels and decreased weakness Behavioral/Psych: Mood is stable, no new changes  Extremities: No lower extremity edema  All other systems were reviewed with the patient and are negative.  I have reviewed the past medical history, past surgical history, social history and family history with the patient and they are unchanged from previous note.  ALLERGIES:  is allergic to lisinopril.  MEDICATIONS:  Current Outpatient Prescriptions  Medication  Sig Dispense Refill  . acetaminophen (TYLENOL) 325 MG tablet Take 650 mg by mouth every 6 (six) hours as needed for moderate pain.    Marland Kitchen amLODipine (NORVASC) 10 MG tablet Take 1 tablet (10 mg total) by mouth daily. 90 tablet 3  . cetirizine (ZYRTEC) 10 MG tablet Take 1 tablet (10 mg total) by mouth daily. (Patient taking differently: Take 10 mg by mouth daily as needed. ) 30 tablet 11  . dexamethasone  (DECADRON) 2 MG tablet Take 1 tablet (2 mg total) by mouth every 12 (twelve) hours. 30 tablet 0  . docusate sodium (COLACE) 100 MG capsule Take 1 capsule (100 mg total) by mouth 2 (two) times daily. 10 capsule 0  . fluticasone (FLONASE) 50 MCG/ACT nasal spray Place 2 sprays into both nostrils daily. 16 g 3  . HYDROcodone-acetaminophen (NORCO/VICODIN) 5-325 MG tablet Take 1 tablet by mouth every 4 (four) hours as needed for moderate pain. 30 tablet 0  . levETIRAcetam (KEPPRA) 500 MG tablet Take 1 tablet (500 mg total) by mouth 2 (two) times daily. 60 tablet 0  . meclizine (ANTIVERT) 12.5 MG tablet Take 1 tablet (12.5 mg total) by mouth 3 (three) times daily as needed for dizziness. 30 tablet 1  . pantoprazole (PROTONIX) 40 MG tablet Take 1 tablet (40 mg total) by mouth daily. 30 tablet 0  . pravastatin (PRAVACHOL) 40 MG tablet Take 1 tablet (40 mg total) by mouth daily. Annual appt is due w/labs must see provider for future refills 30 tablet 0  . tamoxifen (NOLVADEX) 20 MG tablet Take 1 tablet (20 mg total) by mouth daily. 90 tablet 3   No current facility-administered medications for this visit.     PHYSICAL EXAMINATION: ECOG PERFORMANCE STATUS: 1 - Symptomatic but completely ambulatory  There were no vitals filed for this visit. There were no vitals filed for this visit.  GENERAL:alert, no distress and comfortable SKIN: skin color, texture, turgor are normal, no rashes or significant lesions EYES: normal, Conjunctiva are pink and non-injected, sclera clear OROPHARYNX:no exudate, no erythema and lips, buccal mucosa, and tongue normal  NECK: supple, thyroid normal size, non-tender, without nodularity LYMPH:  no palpable lymphadenopathy in the cervical, axillary or inguinal LUNGS: clear to auscultation and percussion with normal breathing effort HEART: regular rate & rhythm and no murmurs and no lower extremity edema ABDOMEN:abdomen soft, non-tender and normal bowel  sounds MUSCULOSKELETAL:no cyanosis of digits and no clubbing  NEURO: alert & oriented x 3 with fluent speech, no focal motor/sensory deficits EXTREMITIES: No lower extremity edema  LABORATORY DATA:  I have reviewed the data as listed   Chemistry      Component Value Date/Time   NA 134 (L) 12/03/2016 1422   NA 143 07/05/2014 1101   K 3.7 12/03/2016 1422   K 3.7 07/05/2014 1101   CL 100 (L) 12/03/2016 1422   CL 103 09/23/2012 1040   CO2 25 12/03/2016 1422   CO2 27 07/05/2014 1101   BUN 13 12/03/2016 1422   BUN 11.8 07/05/2014 1101   CREATININE 0.94 12/03/2016 1422   CREATININE 0.8 07/05/2014 1101      Component Value Date/Time   CALCIUM 8.7 (L) 12/03/2016 1422   CALCIUM 9.4 07/05/2014 1101   ALKPHOS 38 11/26/2016 0800   ALKPHOS 45 07/05/2014 1101   AST 18 11/26/2016 0800   AST 18 07/05/2014 1101   ALT 16 11/26/2016 0800   ALT 15 07/05/2014 1101   BILITOT 0.6 11/26/2016 0800   BILITOT 0.21 07/05/2014  1101       Lab Results  Component Value Date   WBC 10.8 (H) 12/03/2016   HGB 11.5 (L) 12/03/2016   HCT 34.8 (L) 12/03/2016   MCV 87.4 12/03/2016   PLT 222 12/03/2016   NEUTROABS 8.0 (H) 12/03/2016    ASSESSMENT & PLAN:  Primary cancer of upper outer quadrant of right female breast ([place]) Metastatic breast cancer with brain metastases: Cerebellar lesion 2.9 cm in size status post gross total resection and Dr. [name] on 11/21/2016  (Right breast invasive ductal carcinoma multifocal disease ER/PR positive HER-2 negative T2, N1, M0 stage IIB status post mastectomy, adjuvant chemotherapy and radiation now on antiestrogen therapy with tamoxifen since October 2014.)  Tamoxifen toxicities:Patient tolerated tamoxifen extremely well without any major problems or concerns. However because of the brain metastases, I recommended switching her to letrozole therapy.  Letrozole counseling: We discussed the risks and benefits of anti-estrogen therapy with aromatase inhibitors. These  include but not limited to insomnia, hot flashes, mood changes, vaginal dryness, bone density loss, and weight gain. We strongly believe that the benefits far outweigh the risks. Patient understands these risks and consented to starting treatment.   CT chest abdomen pelvis: No evidence of other metastatic disease  Treatment plan: Radiation therapy followed by close surveillance with scans. Since all gross disease been resected and will be radiated, I did not recommend additional systemic therapies other than switching her from tamoxifen to letrozole.  I spent 25 minutes talking to the patient of which more than half was spent in counseling and coordination of care.  No orders of the defined types were placed in this encounter.  The patient has a good understanding of the overall plan. she agrees with it. she will call with any problems that may develop before the next visit here.   Rulon Eisenmenger, MD 12/07/16

## 2016-12-07 NOTE — Telephone Encounter (Signed)
Called and lvm with call back number x2 to remind pt of missed appt today with Dr.Gudena. Rescheduled pt for next week 12/11/16 at 12p after seeing Dr.Moody in the morning.

## 2016-12-07 NOTE — Assessment & Plan Note (Deleted)
Metastatic breast cancer with brain metastases: Cerebellar lesion 2.9 cm in size status post gross total resection and Dr. [name] on 11/21/2016  (Right breast invasive ductal carcinoma multifocal disease ER/PR positive HER-2 negative T2, N1, M0 stage IIB status post mastectomy, adjuvant chemotherapy and radiation now on antiestrogen therapy with tamoxifen since October 2014.)  Tamoxifen toxicities:Patient tolerated tamoxifen extremely well without any major problems or concerns. However because of the brain metastases, I recommended switching her to letrozole therapy.  Letrozole counseling: We discussed the risks and benefits of anti-estrogen therapy with aromatase inhibitors. These include but not limited to insomnia, hot flashes, mood changes, vaginal dryness, bone density loss, and weight gain. We strongly believe that the benefits far outweigh the risks. Patient understands these risks and consented to starting treatment.   CT chest abdomen pelvis: No evidence of other metastatic disease  Treatment plan: Radiation therapy followed by close surveillance with scans. Since all gross disease been resected and will be radiated, I did not recommend additional systemic therapies other than switching her from tamoxifen to letrozole.

## 2016-12-10 ENCOUNTER — Ambulatory Visit: Payer: BLUE CROSS/BLUE SHIELD | Admitting: Family

## 2016-12-10 NOTE — Progress Notes (Addendum)
  Location/Histology of Brain Tumor: Right Cerebellar Mass / infarction    Patient presented with symptoms of:  Occasional head aches, progressive vertigo,ataxia  Past or anticipated interventions, if any, per neurosurgery: Dr. [name], MD  Diagnosis 11/21/2016: 1. Brain, biopsy METASTATIC DUCTAL CARCINOMA OF THE BREAST 2. Brain, for tumor resection METASTATIC DUCTAL CARCINOMA OF THE BREAST Microscopic Comment 2. The carcinoma stains positive for ER, PR, GATA3, negative for pax 8 and TTF-1. The morphology and immunostains pattern support these cells are breast primary.  Past or anticipated interventions, if any, per medical oncology: Dr. Lindi Adie appt 12/11/16 at noon today, on Tamifen regimen   Dose of Decadron, if applicable: 2 mg  Oral every 12 hours since 12/05/16  Recent neurologic symptoms, if any:   Seizures: no  Headaches: occipital mild at present  Nausea: No  Dizziness/ataxia:  Slight dizziness  Difficulty with hand coordination:No  Focal numbness/weakness: weakness on right side  Visual deficits/changes: blurry mild right eye  Confusion/Memory deficits: mild short term memory loss    MPRESSION:12/03/16 CT Head :1. Improving postoperative appearance of the cerebellum status post midline tumor resection. Improved patency of the fourth ventricle and decreased central cerebellar edema since 11/22/2016. 2. No new intracranial abnormality identified. 3. Stable postoperative changes to the suboccipital soft tissues.  Painful bone metastases at present, if any: No  SAFETY ISSUES:  Prior radiation? YES Right Breast 10/02/12-12/16/12, total dose  60.4 Gy Dr. [name]  Pacemaker/ICD?        Pregnancy?   NO  Is the patient on methotrexate? No  Additional Complaints / other details: lives with daughter, d/c 12/05/16 rehab, PT working with patient,  8:53 AM BP 120/72 Comment: lfa sitting  Pulse (!) 59   Temp 98.2 F (36.8 C) (Oral)   Resp 20   Ht 5\' 4"  (1.626 m)   Wt  160 lb 3.2 oz (72.7 kg)   LMP 08/16/2012   SpO2 99%   BMI 27.50 kg/m   Wt Readings from Last 3 Encounters:  12/11/16 160 lb 3.2 oz (72.7 kg)  11/24/16 167 lb 1.7 oz (75.8 kg)  11/23/16 167 lb 3.2 oz (75.8 kg)

## 2016-12-11 ENCOUNTER — Other Ambulatory Visit: Payer: Self-pay | Admitting: Radiation Therapy

## 2016-12-11 ENCOUNTER — Ambulatory Visit
Admission: RE | Admit: 2016-12-11 | Discharge: 2016-12-11 | Disposition: A | Discharge status: Home / Self Care | Payer: BLUE CROSS/BLUE SHIELD | Source: Ambulatory Visit | Attending: Radiation Oncology | Admitting: Radiation Oncology

## 2016-12-11 ENCOUNTER — Ambulatory Visit (HOSPITAL_BASED_OUTPATIENT_CLINIC_OR_DEPARTMENT_OTHER): Payer: BLUE CROSS/BLUE SHIELD | Admitting: Hematology and Oncology

## 2016-12-11 ENCOUNTER — Encounter: Payer: Self-pay | Admitting: Radiation Oncology

## 2016-12-11 ENCOUNTER — Other Ambulatory Visit: Payer: Self-pay | Admitting: Radiation Oncology

## 2016-12-11 ENCOUNTER — Telehealth: Payer: Self-pay | Admitting: Hematology and Oncology

## 2016-12-11 VITALS — BP 120/72 | HR 59 | Temp 98.2°F | Resp 20 | Ht 64.0 in | Wt 160.2 lb

## 2016-12-11 DIAGNOSIS — C50411 Malignant neoplasm of upper-outer quadrant of right female breast: Secondary | ICD-10-CM

## 2016-12-11 DIAGNOSIS — Z8249 Family history of ischemic heart disease and other diseases of the circulatory system: Secondary | ICD-10-CM | POA: Insufficient documentation

## 2016-12-11 DIAGNOSIS — Z809 Family history of malignant neoplasm, unspecified: Secondary | ICD-10-CM

## 2016-12-11 DIAGNOSIS — C7931 Secondary malignant neoplasm of brain: Secondary | ICD-10-CM

## 2016-12-11 DIAGNOSIS — M542 Cervicalgia: Secondary | ICD-10-CM | POA: Insufficient documentation

## 2016-12-11 DIAGNOSIS — Z9011 Acquired absence of right breast and nipple: Secondary | ICD-10-CM | POA: Insufficient documentation

## 2016-12-11 DIAGNOSIS — R51 Headache: Secondary | ICD-10-CM | POA: Diagnosis not present

## 2016-12-11 DIAGNOSIS — I10 Essential (primary) hypertension: Secondary | ICD-10-CM | POA: Insufficient documentation

## 2016-12-11 DIAGNOSIS — K219 Gastro-esophageal reflux disease without esophagitis: Secondary | ICD-10-CM | POA: Insufficient documentation

## 2016-12-11 DIAGNOSIS — Z9221 Personal history of antineoplastic chemotherapy: Secondary | ICD-10-CM | POA: Insufficient documentation

## 2016-12-11 DIAGNOSIS — Z79811 Long term (current) use of aromatase inhibitors: Secondary | ICD-10-CM | POA: Insufficient documentation

## 2016-12-11 DIAGNOSIS — Z888 Allergy status to other drugs, medicaments and biological substances status: Secondary | ICD-10-CM | POA: Insufficient documentation

## 2016-12-11 DIAGNOSIS — Z923 Personal history of irradiation: Secondary | ICD-10-CM | POA: Diagnosis not present

## 2016-12-11 DIAGNOSIS — C50911 Malignant neoplasm of unspecified site of right female breast: Secondary | ICD-10-CM

## 2016-12-11 DIAGNOSIS — Z87891 Personal history of nicotine dependence: Secondary | ICD-10-CM | POA: Insufficient documentation

## 2016-12-11 DIAGNOSIS — Z808 Family history of malignant neoplasm of other organs or systems: Secondary | ICD-10-CM | POA: Insufficient documentation

## 2016-12-11 DIAGNOSIS — Z79899 Other long term (current) drug therapy: Secondary | ICD-10-CM | POA: Insufficient documentation

## 2016-12-11 DIAGNOSIS — C7949 Secondary malignant neoplasm of other parts of nervous system: Secondary | ICD-10-CM

## 2016-12-11 DIAGNOSIS — Z510 Encounter for antineoplastic radiation therapy: Secondary | ICD-10-CM | POA: Insufficient documentation

## 2016-12-11 DIAGNOSIS — Z853 Personal history of malignant neoplasm of breast: Secondary | ICD-10-CM | POA: Insufficient documentation

## 2016-12-11 MED ORDER — ANASTROZOLE 1 MG PO TABS: 1.0000 mg | ORAL_TABLET | Freq: Every day | ORAL | 3 refills | Status: DC

## 2016-12-11 NOTE — Assessment & Plan Note (Signed)
Metastatic breast cancer with brain metastases: Cerebellar lesion 2.9 cm in size status post gross total resection and Dr. [name] on 11/21/2016  (Right breast invasive ductal carcinoma multifocal disease ER/PR positive HER-2 negative T2, N1, M0 stage IIB status post mastectomy, adjuvant chemotherapy and radiation now on antiestrogen therapy with tamoxifen since October 2014.)  Tamoxifen toxicities:Patient tolerated tamoxifen extremely well without any major problems or concerns. However because of the brain metastases, I recommended switching her to letrozole therapy.  Letrozole counseling: We discussed the risks and benefits of anti-estrogen therapy with aromatase inhibitors. These include but not limited to insomnia, hot flashes, mood changes, vaginal dryness, bone density loss, and weight gain. We strongly believe that the benefits far outweigh the risks. Patient understands these risks and consented to starting treatment.   CT chest abdomen pelvis: No evidence of other metastatic disease  Treatment plan: Radiation therapy followed by close surveillance with scans. Since all gross disease been resected and will be radiated, I did not recommend additional systemic therapies other than switching her from tamoxifen to letrozole.

## 2016-12-11 NOTE — Progress Notes (Signed)
Please see the Nurse Progress Note in the MD Initial Consult Encounter for this patient. 

## 2016-12-11 NOTE — Telephone Encounter (Signed)
Gave patient appointment schedule for November not October

## 2016-12-11 NOTE — Progress Notes (Signed)
Radiation Oncology         [phone] ________________________________  Name: Tasha Ortiz        MRN: [id number]  Date of Service: 12/11/2016 DOB: 1955-08-27  LK:[id number], Ples Specter, FNP  Nicholas Lose, MD     REFERRING PHYSICIAN: Nicholas Lose, MD   DIAGNOSIS: The encounter diagnosis was Cancer of right breast metastatic to brain [facility]).   HISTORY OF PRESENT ILLNESS: Tasha Ortiz is a 61 y.o. female seen at the request of Dr. Lindi Adie for a history of metastatic breast cancer. The patient recently presented with headache, dizziness 2 weeks and sinus congestion. She has a history of breast cancer that was treated in 2014 with Dr. Humphrey Rolls and Dr. Lindi Adie, at that time she was 61 years old and her stage was IIB T2bN1a, ER/PR positive HER-2 negative but noted positive invasive ductal right breast cancer. She had right mastectomy and node sampling, and adjuvant chemotherapy and radiation. She has been NED up until presenting this year in August. As above, she did present with increasing headaches, and an MRI of the brain on 11/19/16 revealed an enhancing mass centered within the inferior vermis with extension into right greater than left cerebellar hemispheres measuring 29 x 28 x 27 mm (AP x ML x CC series 14, image 14 and series 15, image 8). The there is surrounding T2 FLAIR hyperintense signal abnormality which extends throughout the vermis and right greater than left cerebellar hemispheres as well as into the right brachium pontis compatible with edema. Local mass effect partially effaces the fourth ventricle and right quadrigeminal plate cistern. She underwent surgical resection confirming metastatic ductal carcinoma consistent with breast cancer history, and her ER/PR receptors were positive, GATA 3 was positive, negative for pax 8 and TTF-1. Postop imaging revealed a 4.1 x 5.7 fluid cavity from her resection site, as well as 2.5 x1.1 cm focus of enhancement in the medial right cerebellum at  thelevel of the middle cerebellar peduncle, with tumor present in this location on the preoperative study. There is leptomeningeal enhancement involving the inferior aspects of bothcerebellar hemispheres. She comes today to discuss options of postop radiotherapy.   PREVIOUS RADIATION THERAPY: Yes   10/02/12-12/06/12: 60.4 Gy to the right chest wall and regional nodes   PAST MEDICAL HISTORY:  Past Medical History:  Diagnosis Date  . Acid reflux   . Allergy    seasonal  . Breast cancer ([place])    right,no iv or blood on right sid, chemo and radiation 2014  . Heartburn   . Hypercholesteremia    taken off chol meds Dec 2012  . Hypertension   . Hypertension   . Personal history of chemotherapy 05/2012   rt breast  . Personal history of radiation therapy 10/2012   rt breast       PAST SURGICAL HISTORY: Past Surgical History:  Procedure Laterality Date  . BREAST BIOPSY Right 04/16/2012  . BREAST SURGERY Right 05/20/12   Rt br mastectomy  . CESAREAN SECTION  25 years ago  . colonoscopy    . COLONOSCOPY WITH PROPOFOL N/A 01/18/2015   Procedure: COLONOSCOPY WITH PROPOFOL;  Surgeon: Mauri Pole, MD;  Location: [facility];  Service: Endoscopy;  Laterality: N/A;  . MASTECTOMY Right 05/2012  . MODIFIED MASTECTOMY Right 05/20/2012   Procedure: MODIFIED MASTECTOMY;  Surgeon: Edward Jolly, MD;  Location: [place];  Service: General;  Laterality: Right;  . PORTACATH PLACEMENT Left 05/20/2012   Procedure: INSERTION PORT-A-CATH;  Surgeon: Edward Jolly,  MD;  Location: [place];  Service: General;  Laterality: Left;  . SUBOCCIPITAL CRANIECTOMY CERVICAL LAMINECTOMY N/A 11/21/2016   Procedure: SUBOCCIPITAL CRANIECTOMY RESECTION TUMOR;  Surgeon: Newman Pies, MD;  Location: [place];  Service: Neurosurgery;  Laterality: N/A;  . TUBAL LIGATION       FAMILY HISTORY:  Family History  Problem Relation Age of Onset  . Hypertension Mother   . Cancer Sister        brain  . Cancer  Cousin        breast  . Healthy Father   . Healthy Maternal Grandmother   . Healthy Maternal Grandfather   . Healthy Paternal Grandmother   . Healthy Paternal Grandfather   . Diabetes Daughter   . Colon cancer Neg Hx      SOCIAL HISTORY:  reports that she has never smoked. She quit smokeless tobacco use about 20 years ago. Her smokeless tobacco use included Chew. She reports that she does not drink alcohol or use drugs. The patient lives in [place]. She is accompanied by her daughter. She works for [employer].    ALLERGIES: Lisinopril   MEDICATIONS:  Current Outpatient Prescriptions  Medication Sig Dispense Refill  . acetaminophen (TYLENOL) 325 MG tablet Take 650 mg by mouth every 6 (six) hours as needed for moderate pain.    Marland Kitchen amLODipine (NORVASC) 10 MG tablet Take 1 tablet (10 mg total) by mouth daily. 90 tablet 3  . cetirizine (ZYRTEC) 10 MG tablet Take 1 tablet (10 mg total) by mouth daily. (Patient taking differently: Take 10 mg by mouth daily as needed. ) 30 tablet 11  . dexamethasone (DECADRON) 2 MG tablet Take 1 tablet (2 mg total) by mouth every 12 (twelve) hours. 30 tablet 0  . docusate sodium (COLACE) 100 MG capsule Take 1 capsule (100 mg total) by mouth 2 (two) times daily. 10 capsule 0  . fluticasone (FLONASE) 50 MCG/ACT nasal spray Place 2 sprays into both nostrils daily. 16 g 3  . HYDROcodone-acetaminophen (NORCO/VICODIN) 5-325 MG tablet Take 1 tablet by mouth every 4 (four) hours as needed for moderate pain. 30 tablet 0  . levETIRAcetam (KEPPRA) 500 MG tablet Take 1 tablet (500 mg total) by mouth 2 (two) times daily. 60 tablet 0  . meclizine (ANTIVERT) 12.5 MG tablet Take 1 tablet (12.5 mg total) by mouth 3 (three) times daily as needed for dizziness. 30 tablet 1  . pravastatin (PRAVACHOL) 40 MG tablet Take 1 tablet (40 mg total) by mouth daily. Annual appt is due w/labs must see provider for future refills 30 tablet 0  . tamoxifen (NOLVADEX) 20 MG tablet Take  1 tablet (20 mg total) by mouth daily. 90 tablet 3  . pantoprazole (PROTONIX) 40 MG tablet Take 1 tablet (40 mg total) by mouth daily. (Patient not taking: Reported on 12/11/2016) 30 tablet 0   No current facility-administered medications for this encounter.      REVIEW OF SYSTEMS: On review of systems, the patient reports that she is doing well overall. She denies any chest pain, shortness of breath, cough, fevers, chills, night sweats, unintended weight changes. She has occasional headaches and has been using Tylenol and hydrocodone intermittently for these. She denies any visual or equilibrium changes. She denies any bowel or bladder disturbances, and denies abdominal pain, nausea or vomiting. She denies any new musculoskeletal or joint aches or pains. A complete review of systems is obtained and is otherwise negative.     PHYSICAL EXAM:  Wt Readings  from Last 3 Encounters:  12/11/16 160 lb 3.2 oz (72.7 kg)  11/24/16 167 lb 1.7 oz (75.8 kg)  11/23/16 167 lb 3.2 oz (75.8 kg)   Temp Readings from Last 3 Encounters:  12/11/16 98.2 F (36.8 C) (Oral)  12/05/16 98.7 F (37.1 C) (Oral)  11/24/16 98.5 F (36.9 C) (Oral)   BP Readings from Last 3 Encounters:  12/11/16 120/72  12/05/16 113/75  11/24/16 139/73   Pulse Readings from Last 3 Encounters:  12/11/16 (!) 59  12/05/16 70  11/24/16 75   Pain Assessment Pain Score: 2  Pain Loc: Head/10  In general this is a well appearing African American female in no acute distress. She is alert and oriented x4 and appropriate throughout the examination. HEENT reveals that the patient is normocephalic, atraumatic. EOMs are intact. PERRLA. Her surgical site on the posterior scalp is well healed. No erythema or fullness is noted. Skin is intact without any evidence of gross lesions. Cardiovascular exam reveals a regular rate and rhythm, no clicks rubs or murmurs are auscultated. Chest is clear to auscultation bilaterally. Lymphatic assessment is  performed and does not reveal any adenopathy in the cervical, supraclavicular, axillary, or inguinal chains. Abdomen has active bowel sounds in all quadrants and is intact. The abdomen is soft, non tender, non distended. Lower extremities are negative for pretibial pitting edema, deep calf tenderness, cyanosis or clubbing.   ECOG = 1  0 - Asymptomatic (Fully active, able to carry on all predisease activities without restriction)  1 - Symptomatic but completely ambulatory (Restricted in physically strenuous activity but ambulatory and able to carry out work of a light or sedentary nature. For example, light housework, office work)  2 - Symptomatic, <50% in bed during the day (Ambulatory and capable of all self care but unable to carry out any work activities. Up and about more than 50% of waking hours)  3 - Symptomatic, >50% in bed, but not bedbound (Capable of only limited self-care, confined to bed or chair 50% or more of waking hours)  4 - Bedbound (Completely disabled. Cannot carry on any self-care. Totally confined to bed or chair)  5 - Death   Eustace Pen MM, Creech RH, Tormey DC, et al. [phone]). "Toxicity and response criteria of the [facility]". [place] Oncol. 5 (6): 649-55    LABORATORY DATA:  Lab Results  Component Value Date   WBC 10.8 (H) 12/03/2016   HGB 11.5 (L) 12/03/2016   HCT 34.8 (L) 12/03/2016   MCV 87.4 12/03/2016   PLT 222 12/03/2016   Lab Results  Component Value Date   NA 134 (L) 12/03/2016   K 3.7 12/03/2016   CL 100 (L) 12/03/2016   CO2 25 12/03/2016   Lab Results  Component Value Date   ALT 16 11/26/2016   AST 18 11/26/2016   ALKPHOS 38 11/26/2016   BILITOT 0.6 11/26/2016      RADIOGRAPHY: Dg Chest 2 View  Result Date: 12/03/2016 CLINICAL DATA:  Acute fever and weakness. EXAM: CHEST  2 VIEW COMPARISON:  11/21/2016 FINDINGS: The cardiomediastinal silhouette is unremarkable. A left Port-A-Cath again noted with tip in the  region of the azygos vein. There is no evidence of focal airspace disease, pulmonary edema, suspicious pulmonary nodule/mass, pleural effusion, or pneumothorax. No acute bony abnormalities are identified. IMPRESSION: No evidence of acute cardiopulmonary disease. Electronically Signed   By: Margarette Canada M.D.   On: 12/03/2016 16:24   Ct Head Wo Contrast  Result  Date: 12/03/2016 CLINICAL DATA:  61 year old female status post resection of central cerebellar tumor on a 11/21/2016. Tumor pathology reveals metastatic ductal carcinoma of the breast. Witnessed seizure today. Headache. EXAM: CT HEAD WITHOUT CONTRAST TECHNIQUE: Contiguous axial images were obtained from the base of the skull through the vertex without intravenous contrast. COMPARISON:  Postoperative brain MRI 11/22/2016 and earlier. FINDINGS: Brain: Improved basilar cistern patency since the preoperative MRI. Midline vermis resection cavity with surrounding cerebellar edema which appears regressed since 11/22/2016. Improved patency of the fourth ventricle. No ventriculomegaly. Near complete resolution of pneumo ventricle, only trace gas now within the left frontal horn and right temporal horn. No acute intracranial hemorrhage identified. No supratentorial mass effect. Stable supratentorial gray-white matter differentiation with no cortically based acute infarct identified. Vascular: Mild Calcified atherosclerosis at the skull base. Skull: Midline suboccipital craniectomy. Otherwise stable calvarium and visible skullbase since head CT 03/27/2013. No suspicious osseous lesion. Sinuses/Orbits: Visualized paranasal sinuses and mastoids are stable and well pneumatized. Other: Similar volume suboccipital soft tissue fluid/pseudo meningocele tracking inferiorly from the midline suboccipital craniectomy. Overlying superficial scalp soft tissues have a satisfactory postoperative appearance. Visualized orbit soft tissues are within normal limits. IMPRESSION: 1.  Improving postoperative appearance of the cerebellum status post midline tumor resection. Improved patency of the fourth ventricle and decreased central cerebellar edema since 11/22/2016. 2. No new intracranial abnormality identified. 3. Stable postoperative changes to the suboccipital soft tissues. Electronically Signed   By: Genevie Ann M.D.   On: 12/03/2016 15:29   Ct Chest W Contrast  Result Date: 11/19/2016 CLINICAL DATA:  Sinus congestion assessed with headache and dizziness x2 weeks. Intermittent nausea without vomiting. EXAM: CT CHEST, ABDOMEN, AND PELVIS WITH CONTRAST TECHNIQUE: Multidetector CT imaging of the chest, abdomen and pelvis was performed following the standard protocol during bolus administration of intravenous contrast. CONTRAST:  182m ISOVUE-300 IOPAMIDOL (ISOVUE-300) INJECTION 61% COMPARISON:  10/26/2013 and 03/2012 CT abdomen and pelvis FINDINGS: CT CHEST FINDINGS Cardiovascular: Normal size heart without pericardial effusion. No large central pulmonary embolus. Minimal aortic atherosclerosis at the arch without aneurysm or dissection. Unremarkable central pulmonary vasculature. No pulmonary embolus noted. Mediastinum/Nodes: No enlarged mediastinal, hilar, or axillary lymph nodes. Thyroid gland, trachea, and esophagus demonstrate no significant findings. Right axillary lymph node dissection clips are noted. Small fat containing left axillary lymph nodes without pathologic enlargement. Lungs/Pleura: Minimal scarring at the right lung apex. Right middle lobe and both lower lobes subsegmental atelectasis and/or scarring. Small subpleural nodule in the right lower lobe measuring 5.2 mm in average, stable in appearance relative to 2014 consistent with a benign finding. Minimal pleural thickening along the right major fissure. No pneumonic consolidation or dominant mass. No effusion or pneumothorax. Musculoskeletal: Left anterior chest wall port catheter with tip in the mid SVC. Status post right  mastectomy. CT ABDOMEN PELVIS FINDINGS Hepatobiliary: Hepatic steatosis without space-occupying mass. No biliary dilatation. Normal gallbladder. Pancreas: Normal Spleen: Normal Adrenals/Urinary Tract: Normal bilateral adrenal glands. Symmetric enhancement of both kidneys without focal mass. No obstructive uropathy. The bladder is unremarkable. Stomach/Bowel: Contracted appearing stomach. Normal small bowel rotation without obstruction or inflammation. And a moderate amount of fecal retention is seen within large bowel consistent with constipation. The appendix is normal. Vascular/Lymphatic: Aortic atherosclerosis. No enlarged abdominal or pelvic lymph nodes. Reproductive: Uterus and bilateral adnexa are unremarkable. Other: No abdominal wall hernia or abnormality. No abdominopelvic ascites. Musculoskeletal: No acute or significant osseous findings. IMPRESSION: 1. Post right mastectomy and axillary lymph node dissection change. 2. No evidence of  metastatic disease. 3. Stable right lower lobe subpleural nodule dating back to 2014 consistent with benign finding. 4. Moderate colonic stool burden consistent constipation. No bowel inflammation or obstruction. 5. Mild hepatic steatosis. Electronically Signed   By: Ashley Royalty M.D.   On: 11/19/2016 21:52   Mr Jeri Cos AO Contrast  Result Date: 11/22/2016 CLINICAL DATA:  Postop day 1 right cerebellar tumor resection. History of breast cancer. EXAM: MRI HEAD WITHOUT AND WITH CONTRAST TECHNIQUE: Multiplanar, multiecho pulse sequences of the brain and surrounding structures were obtained without and with intravenous contrast. CONTRAST:  41m MULTIHANCE GADOBENATE DIMEGLUMINE 529 MG/ML IV SOLN COMPARISON:  11/19/2016 FINDINGS: Brain: Sequelae of interval suboccipital craniectomy and cerebellar mass resection are identified. Blood products are present in the resection cavity. Pneumocephalus is noted, including gas in the lateral and third ventricles. A 4.1 x 2.4 x 5.7 cm  extra-axial fluid collection is present at the craniectomy site extending into the posterior soft tissues of the upper neck. There is moderate volume restricted diffusion involving the medial aspects of both cerebellar hemispheres and a portion of the remaining cerebellar vermis consistent with acute infarct. Associated cytotoxic edema is noted. There is also residual vasogenic edema the most notable in the right cerebellar hemisphere and superior vermis extending into the right middle cerebellar peduncle. Edema and blood products result in persistent partial effacement of the fourth ventricle which has mildly increased, however there is no significant dilatation of the lateral or third ventricles to indicate obstructive hydrocephalus. There is a 2.5 x 1.1 cm focus of enhancement in the medial right cerebellum at the level of the middle cerebellar peduncle (series 11, image 14), with tumor present in this location on the preoperative study. There is leptomeningeal enhancement involving the inferior aspects of both cerebellar hemispheres, likely related to the acute infarcts as this was not present on the preoperative study. No supratentorial mass is present. Small foci of T2 hyperintensity scattered throughout the subcortical and deep cerebral white matter bilaterally are unchanged and nonspecific but compatible with chronic small vessel ischemic disease, mild-to-moderate for age. Vascular: Major intracranial vascular flow voids are preserved, with the right vertebral artery appearing dominant. Skull and upper cervical spine: Suboccipital craniectomy. No suspicious marrow lesion. Sinuses/Orbits: Unremarkable orbits. Paranasal sinuses and mastoid air cells are clear. Other: None. IMPRESSION: 1. Postoperative changes from interval cerebellar mass resection as above. 2.5 cm focus of enhancement in the medial right cerebellum concerning for residual tumor. 2. Moderate-sized acute infarct involving the inferior and  medial cerebellar hemispheres. 3. Slightly increased effacement of the fourth ventricle without evidence of obstructive hydrocephalus. Electronically Signed   By: ALogan BoresM.D.   On: 11/22/2016 22:47   Mr BJeri CosWZHContrast  Result Date: 11/19/2016 CLINICAL DATA:  61y/o F; headache and dizziness for 2 weeks. Sinus congestion. History of right breast cancer post chemo and radiation. EXAM: MRI HEAD WITHOUT AND WITH CONTRAST TECHNIQUE: Multiplanar, multiecho pulse sequences of the brain and surrounding structures were obtained without and with intravenous contrast. CONTRAST:  15 cc MultiHance. COMPARISON:  03/27/2013 CT head FINDINGS: Brain: Enhancing mass centered within the inferior vermis with extension into right greater than left cerebellar hemispheres measuring 29 x 28 x 27 mm (AP x ML x CC series 14, image 14 and series 15, image 8). The there is surrounding T2 FLAIR hyperintense signal abnormality which extends throughout the vermis and right greater than left cerebellar hemispheres as well as into the right brachium pontis compatible with edema. Local  mass effect partially effaces the fourth ventricle and right quadrigeminal plate cistern. No evidence for acute/early subacute infarct of the brain, acute intracranial hemorrhage, or additional enhancing lesion. No hydrocephalus at this time. Background of mild chronic microvascular ischemic changes and parenchymal volume loss of the brain. Vascular: Normal flow voids. Skull and upper cervical spine: Normal marrow signal. Sinuses/Orbits: The mild ethmoid sinus mucosal thickening. No abnormal signal of mastoid air cells. Normal orbits. Other: None. IMPRESSION: 29 mm mass, likely metastasis, within the inferior vermis with surrounding edema and local mass effect partially effacing the fourth ventricle. No hydrocephalus at this time, but the patient is at high risk for developing obstructive hydrocephalus as mass effect progresses. These results were  called by telephone at the time of interpretation on 11/19/2016 at 7:36 pm to Dr. Billy Fischer, who verbally acknowledged these results. Electronically Signed   By: Kristine Garbe M.D.   On: 11/19/2016 19:43   Ct Abdomen Pelvis W Contrast  Result Date: 11/19/2016 CLINICAL DATA:  Sinus congestion assessed with headache and dizziness x2 weeks. Intermittent nausea without vomiting. EXAM: CT CHEST, ABDOMEN, AND PELVIS WITH CONTRAST TECHNIQUE: Multidetector CT imaging of the chest, abdomen and pelvis was performed following the standard protocol during bolus administration of intravenous contrast. CONTRAST:  117m ISOVUE-300 IOPAMIDOL (ISOVUE-300) INJECTION 61% COMPARISON:  10/26/2013 and 03/2012 CT abdomen and pelvis FINDINGS: CT CHEST FINDINGS Cardiovascular: Normal size heart without pericardial effusion. No large central pulmonary embolus. Minimal aortic atherosclerosis at the arch without aneurysm or dissection. Unremarkable central pulmonary vasculature. No pulmonary embolus noted. Mediastinum/Nodes: No enlarged mediastinal, hilar, or axillary lymph nodes. Thyroid gland, trachea, and esophagus demonstrate no significant findings. Right axillary lymph node dissection clips are noted. Small fat containing left axillary lymph nodes without pathologic enlargement. Lungs/Pleura: Minimal scarring at the right lung apex. Right middle lobe and both lower lobes subsegmental atelectasis and/or scarring. Small subpleural nodule in the right lower lobe measuring 5.2 mm in average, stable in appearance relative to 2014 consistent with a benign finding. Minimal pleural thickening along the right major fissure. No pneumonic consolidation or dominant mass. No effusion or pneumothorax. Musculoskeletal: Left anterior chest wall port catheter with tip in the mid SVC. Status post right mastectomy. CT ABDOMEN PELVIS FINDINGS Hepatobiliary: Hepatic steatosis without space-occupying mass. No biliary dilatation. Normal  gallbladder. Pancreas: Normal Spleen: Normal Adrenals/Urinary Tract: Normal bilateral adrenal glands. Symmetric enhancement of both kidneys without focal mass. No obstructive uropathy. The bladder is unremarkable. Stomach/Bowel: Contracted appearing stomach. Normal small bowel rotation without obstruction or inflammation. And a moderate amount of fecal retention is seen within large bowel consistent with constipation. The appendix is normal. Vascular/Lymphatic: Aortic atherosclerosis. No enlarged abdominal or pelvic lymph nodes. Reproductive: Uterus and bilateral adnexa are unremarkable. Other: No abdominal wall hernia or abnormality. No abdominopelvic ascites. Musculoskeletal: No acute or significant osseous findings. IMPRESSION: 1. Post right mastectomy and axillary lymph node dissection change. 2. No evidence of metastatic disease. 3. Stable right lower lobe subpleural nodule dating back to 2014 consistent with benign finding. 4. Moderate colonic stool burden consistent constipation. No bowel inflammation or obstruction. 5. Mild hepatic steatosis. Electronically Signed   By: DAshley RoyaltyM.D.   On: 11/19/2016 21:52   Dg Chest Port 1 View  Result Date: 11/21/2016 CLINICAL DATA:  Central line placement EXAM: PORTABLE CHEST 1 VIEW COMPARISON:  03/28/2013 CXR and 11/19/2016 CT FINDINGS: The heart size and mediastinal contours are within normal limits. Aortic atherosclerosis at the arch without aneurysm. Right IJ central line catheter  tip is seen at the cavoatrial junction. No pneumothorax. Port catheter tip is seen in the proximal to mid SVC. Both lungs are clear. The visualized skeletal structures are unremarkable. Right axillary lymph node dissection clips are noted. IMPRESSION: 1. Central venous line catheter tip at the cavoatrial junction without pneumothorax. 2. Clear lungs. 3. Aortic atherosclerosis. Electronically Signed   By: Ashley Royalty M.D.   On: 11/21/2016 17:56       IMPRESSION/PLAN: 1. Metastatic  breast cancer with disease to brain. Dr. [name] discusses the pathology findings and reviews the nature of metastatic disease to the brain. The consensus from the brain oncology conference included a 3T MRI to rule out disease in the cerebral cortex as well. We would recommend proceeding with radiotherapy to the posterior fossa if there is no disease elsewhere. Dr. [name] discusses the role for treatment over about 2 weeks, then about 1 month later, proceed with repeat 3T MRI, and consider a boost to the surgical resection cavity versus surveillance. We discussed the risks, benefits, short, and long term effects of radiotherapy, and the patient is interested in proceeding. Dr. [name] discusses the delivery and logistics of radiotherapy. We will plan on radiotherapy simulation once her 3T MRI has been performed, which is scheduled for 12/17/16. She will remain on Dexamethasone, Protonix, and Keppra at her current doses. She will let us know if she's running low on these medications. We would anticipate following her q3 months in brain oncology conference long term. She will also meet with Dr. Lindi Adie today to review recommendations for her continued surveillance versus active treatment. She will return for simulation on Tuesday 12/18/16. Written consent is obtained and placed in the chart, a copy was provided to the patient. 2. Possible genetic predisposition to malignancy. The patient is eligible for genetic testing. She is interested in this and will be referred for discussion. 3. Headaches. We discussed the use of OTC tylenol as the preferred route of headache relief. If this does not improve, we discussed referral to neuro-oncology for discussion of additional abortive medications.  The above documentation reflects my direct findings during this shared patient visit. Please see the separate note by Dr. [name] on this date for the remainder of the patient's plan of care.    Carola Rhine, PAC

## 2016-12-11 NOTE — Telephone Encounter (Signed)
Gave patient avs report and appointments for October  °

## 2016-12-11 NOTE — Progress Notes (Signed)
Patient Care Team: Golden Circle, FNP as PCP - General (Family Medicine)  DIAGNOSIS:  Encounter Diagnosis  Name Primary?  . Primary cancer of upper outer quadrant of right female breast ([place])     SUMMARY OF ONCOLOGIC HISTORY:   Primary cancer of upper outer quadrant of right female breast ([place])   04/16/2012 Initial Biopsy    Right breast mass biopsy invasive ductal carcinoma with abundant mucin in all the 3 masses, right axillary lymph node biopsy positive for IDC, ER 100% PR percent Ki-67 19%, HER-2 negative ratio 0.94      04/21/2012 Breast MRI    Right breast 3 masses 11o'clock position 1.8 cm, now 11:00 position 2 cm, and 10:00 position 2.3 cm with an abnormal level I right axillary lymph node 3 cm      05/20/2012 Surgery    Right breast mastectomy invasive ductal carcinoma grade 2; 1.9 cm, 2.5 cm, 1.8 cm, 1/17 LN positive, second and third massive grade 3      06/24/2012 - 10/07/2012 Chemotherapy    Adjuvant chemotherapy with Taxotere Cytoxan x6 cycles      10/16/2012 - 12/16/2012 Radiation Therapy    Adjuvant radiation therapy by Dr. [name]      01/07/2013 -  Anti-estrogen oral therapy    Tamoxifen 20 mg daily      11/19/2016 Imaging    MRI brain: 2.9 similar mass within the inferior vermis with surrounding edema and local mass effect partially effacing the fourth ventricle.       11/21/2016 Relapse/Recurrence    Brain biopsy: Metastatic ductal carcinoma the breast positive for ER PR, GATA 3, negative for PAX8 and TTF-1      11/21/2016 Surgery    Suboccipital craniotomy for gross total resection of cerebellar tumor by Dr. [name]       CHIEF COMPLIANT: follow-up to discuss a treatment plan for metastatic breast cancer with brain metastases that was resected  INTERVAL HISTORY: Tasha Ortiz is a 61 year old with above-mentioned history of ER positive metastatic breast cancer who presented to the hospital with the brain metastases and she had a resection  of the tumor on 11/21/2016. Final pathology came back as a breast cancer that was ER positive. She met with radiation oncology earlier today and she is here today with me to discuss the treatment plan. Patient has been making dramatic progress in rehabilitation. She is able to walk without assistance. Although she came in today on a wheelchair.  REVIEW OF SYSTEMS:   Constitutional: Denies fevers, chills or abnormal weight loss Eyes: Denies blurriness of vision Ears, nose, mouth, throat, and face: Denies mucositis or sore throat Respiratory: Denies cough, dyspnea or wheezes Cardiovascular: Denies palpitation, chest discomfort Gastrointestinal:  Denies nausea, heartburn or change in bowel habits Skin: Denies abnormal skin rashes Lymphatics: Denies new lymphadenopathy or easy bruising Neurological:Denies numbness, tingling or new weaknesses Behavioral/Psych: Mood is stable, no new changes  Extremities: No lower extremity edema  All other systems were reviewed with the patient and are negative.  I have reviewed the past medical history, past surgical history, social history and family history with the patient and they are unchanged from previous note.  ALLERGIES:  is allergic to lisinopril.  MEDICATIONS:  Current Outpatient Prescriptions  Medication Sig Dispense Refill  . acetaminophen (TYLENOL) 325 MG tablet Take 650 mg by mouth every 6 (six) hours as needed for moderate pain.    Marland Kitchen amLODipine (NORVASC) 10 MG tablet Take 1 tablet (10 mg total) by  mouth daily. 90 tablet 3  . anastrozole (ARIMIDEX) 1 MG tablet Take 1 tablet (1 mg total) by mouth daily. 90 tablet 3  . cetirizine (ZYRTEC) 10 MG tablet Take 1 tablet (10 mg total) by mouth daily. (Patient taking differently: Take 10 mg by mouth daily as needed. ) 30 tablet 11  . dexamethasone (DECADRON) 2 MG tablet Take 1 tablet (2 mg total) by mouth every 12 (twelve) hours. 30 tablet 0  . docusate sodium (COLACE) 100 MG capsule Take 1 capsule  (100 mg total) by mouth 2 (two) times daily. 10 capsule 0  . fluticasone (FLONASE) 50 MCG/ACT nasal spray Place 2 sprays into both nostrils daily. 16 g 3  . HYDROcodone-acetaminophen (NORCO/VICODIN) 5-325 MG tablet Take 1 tablet by mouth every 4 (four) hours as needed for moderate pain. 30 tablet 0  . levETIRAcetam (KEPPRA) 500 MG tablet Take 1 tablet (500 mg total) by mouth 2 (two) times daily. 60 tablet 0  . meclizine (ANTIVERT) 12.5 MG tablet Take 1 tablet (12.5 mg total) by mouth 3 (three) times daily as needed for dizziness. 30 tablet 1  . pantoprazole (PROTONIX) 40 MG tablet Take 1 tablet (40 mg total) by mouth daily. (Patient not taking: Reported on 12/11/2016) 30 tablet 0  . pravastatin (PRAVACHOL) 40 MG tablet Take 1 tablet (40 mg total) by mouth daily. Annual appt is due w/labs must see provider for future refills 30 tablet 0   No current facility-administered medications for this visit.     PHYSICAL EXAMINATION: ECOG PERFORMANCE STATUS: 1 - Symptomatic but completely ambulatory  Vitals:   12/11/16 1207  BP: (!) 115/92  Pulse: 81  Resp: 18  Temp: 98.8 F (37.1 C)  SpO2: 100%   Filed Weights   12/11/16 1207  Weight: 162 lb 9.6 oz (73.8 kg)    GENERAL:alert, no distress and comfortable SKIN: skin color, texture, turgor are normal, no rashes or significant lesions EYES: normal, Conjunctiva are pink and non-injected, sclera clear OROPHARYNX:no exudate, no erythema and lips, buccal mucosa, and tongue normal  NECK: supple, thyroid normal size, non-tender, without nodularity LYMPH:  no palpable lymphadenopathy in the cervical, axillary or inguinal LUNGS: clear to auscultation and percussion with normal breathing effort HEART: regular rate & rhythm and no murmurs and no lower extremity edema ABDOMEN:abdomen soft, non-tender and normal bowel sounds MUSCULOSKELETAL:no cyanosis of digits and no clubbing  NEURO: alert & oriented x 3 with fluent speech, no focal motor/sensory  deficits EXTREMITIES: No lower extremity edema  LABORATORY DATA:  I have reviewed the data as listed   Chemistry      Component Value Date/Time   NA 134 (L) 12/03/2016 1422   NA 143 07/05/2014 1101   K 3.7 12/03/2016 1422   K 3.7 07/05/2014 1101   CL 100 (L) 12/03/2016 1422   CL 103 09/23/2012 1040   CO2 25 12/03/2016 1422   CO2 27 07/05/2014 1101   BUN 13 12/03/2016 1422   BUN 11.8 07/05/2014 1101   CREATININE 0.94 12/03/2016 1422   CREATININE 0.8 07/05/2014 1101      Component Value Date/Time   CALCIUM 8.7 (L) 12/03/2016 1422   CALCIUM 9.4 07/05/2014 1101   ALKPHOS 38 11/26/2016 0800   ALKPHOS 45 07/05/2014 1101   AST 18 11/26/2016 0800   AST 18 07/05/2014 1101   ALT 16 11/26/2016 0800   ALT 15 07/05/2014 1101   BILITOT 0.6 11/26/2016 0800   BILITOT 0.21 07/05/2014 1101       Lab  Results  Component Value Date   WBC 10.8 (H) 12/03/2016   HGB 11.5 (L) 12/03/2016   HCT 34.8 (L) 12/03/2016   MCV 87.4 12/03/2016   PLT 222 12/03/2016   NEUTROABS 8.0 (H) 12/03/2016    ASSESSMENT & PLAN:  Primary cancer of upper outer quadrant of right female breast ([place]) Metastatic breast cancer with brain metastases: Cerebellar lesion 2.9 cm in size status post gross total resection and Dr. [name] on 11/21/2016  (Right breast invasive ductal carcinoma multifocal disease ER/PR positive HER-2 negative T2, N1, M0 stage IIB status post mastectomy, adjuvant chemotherapy and radiation now on antiestrogen therapy with tamoxifen since October 2014.)  Tamoxifen toxicities:Patient tolerated tamoxifen extremely well without any major problems or concerns. However because of the brain metastases, I recommended switching her to letrozole therapy.  Letrozole counseling: We discussed the risks and benefits of anti-estrogen therapy with aromatase inhibitors. These include but not limited to insomnia, hot flashes, mood changes, vaginal dryness, bone density loss, and weight gain. We strongly  believe that the benefits far outweigh the risks. Patient understands these risks and consented to starting treatment.   CT chest abdomen pelvis: No evidence of other metastatic disease  Treatment plan: Radiation therapy followed by close surveillance with scans. Since all gross disease been resected and will be radiated, I did not recommend additional systemic therapies other than switching her from tamoxifen to letrozole.  I give prescription to her for anastrozole therapy. I will see the patient back in 3 months for toxicity check and follow-up.   I spent 25 minutes talking to the patient of which more than half was spent in counseling and coordination of care.  No orders of the defined types were placed in this encounter.  The patient has a good understanding of the overall plan. she agrees with it. she will call with any problems that may develop before the next visit here.   Rulon Eisenmenger, MD 12/11/16

## 2016-12-12 ENCOUNTER — Telehealth: Payer: Self-pay | Admitting: Hematology and Oncology

## 2016-12-12 NOTE — Telephone Encounter (Signed)
Called patient regarding December appointment

## 2016-12-17 ENCOUNTER — Ambulatory Visit: Payer: BLUE CROSS/BLUE SHIELD | Admitting: Family

## 2016-12-17 ENCOUNTER — Ambulatory Visit (HOSPITAL_COMMUNITY)
Admission: RE | Admit: 2016-12-17 | Discharge: 2016-12-17 | Disposition: A | Discharge status: Home / Self Care | Payer: BLUE CROSS/BLUE SHIELD | Source: Ambulatory Visit | Attending: Radiation Oncology | Admitting: Radiation Oncology

## 2016-12-17 DIAGNOSIS — C7931 Secondary malignant neoplasm of brain: Secondary | ICD-10-CM

## 2016-12-17 DIAGNOSIS — C7949 Secondary malignant neoplasm of other parts of nervous system: Secondary | ICD-10-CM | POA: Diagnosis present

## 2016-12-17 DIAGNOSIS — G9619 Other disorders of meninges, not elsewhere classified: Secondary | ICD-10-CM | POA: Diagnosis not present

## 2016-12-17 DIAGNOSIS — Z9889 Other specified postprocedural states: Secondary | ICD-10-CM | POA: Diagnosis not present

## 2016-12-17 MED ORDER — GADOBENATE DIMEGLUMINE 529 MG/ML IV SOLN
16.0000 mL | Freq: Once | INTRAVENOUS | Status: AC | PRN
  Administered 2016-12-17: 16 mL via INTRAVENOUS

## 2016-12-18 ENCOUNTER — Ambulatory Visit
Admission: RE | Admit: 2016-12-18 | Discharge: 2016-12-18 | Disposition: A | Discharge status: Home / Self Care | Payer: BLUE CROSS/BLUE SHIELD | Source: Ambulatory Visit | Attending: Radiation Oncology | Admitting: Radiation Oncology

## 2016-12-18 DIAGNOSIS — Z510 Encounter for antineoplastic radiation therapy: Secondary | ICD-10-CM | POA: Diagnosis not present

## 2016-12-18 DIAGNOSIS — C7931 Secondary malignant neoplasm of brain: Secondary | ICD-10-CM

## 2016-12-21 ENCOUNTER — Other Ambulatory Visit: Payer: Self-pay | Admitting: *Deleted

## 2016-12-21 ENCOUNTER — Telehealth: Payer: Self-pay | Admitting: *Deleted

## 2016-12-21 ENCOUNTER — Other Ambulatory Visit: Payer: Self-pay | Admitting: Radiation Oncology

## 2016-12-21 MED ORDER — DEXAMETHASONE 2 MG PO TABS: 2.0000 mg | ORAL_TABLET | Freq: Two times a day (BID) | ORAL | 0 refills | Status: DC

## 2016-12-21 NOTE — Telephone Encounter (Signed)
Called [facility] left VM for rx decadron refill same as last rx, can call for any questions/concersns 3:55 PM

## 2016-12-21 NOTE — Telephone Encounter (Signed)
error 

## 2016-12-21 NOTE — Telephone Encounter (Signed)
Returned vm  After I returned from lunch , spoke with Torie,daughter, MD to es-cribe medication decadron, have mother take 2 tabs of 2mg (4mg total) when she gets new rx, then go back to 2mg  q 12 hours, if they haven't heard from their pharmacy by 4:30pm today please call this RN back as I lave at 5pm, and I can call the pharmacy, patientran out of medication yesterday andc/o increasing head aches and swelling per daughter,MD made aware, daughter gave verbal understanding  3:05 PM

## 2016-12-21 NOTE — Telephone Encounter (Addendum)
Ms Shanley has run out of the dexamethasone prescribed on 12/05/16 by Linna Hoff.  She is having some headaches and some swelling and her daughter would like to know if they can get a refill on that medication today. Appt scheduled for 01/01/17 with Naaman Plummer. Please advise.

## 2016-12-24 DIAGNOSIS — Z510 Encounter for antineoplastic radiation therapy: Secondary | ICD-10-CM | POA: Diagnosis not present

## 2016-12-24 MED ORDER — DEXAMETHASONE 2 MG PO TABS: 2.0000 mg | ORAL_TABLET | Freq: Two times a day (BID) | ORAL | 1 refills | Status: DC

## 2016-12-24 NOTE — Telephone Encounter (Signed)
I called and they said the oncologist gave it to her.

## 2016-12-24 NOTE — Telephone Encounter (Signed)
Given her headaches, I have refilled, but she should check with Dr. Lindi Adie about whether she needs to taper dexamethasone

## 2016-12-25 ENCOUNTER — Ambulatory Visit
Admission: RE | Admit: 2016-12-25 | Discharge: 2016-12-25 | Disposition: A | Discharge status: Home / Self Care | Payer: BLUE CROSS/BLUE SHIELD | Source: Ambulatory Visit | Attending: Radiation Oncology | Admitting: Radiation Oncology

## 2016-12-25 DIAGNOSIS — Z510 Encounter for antineoplastic radiation therapy: Secondary | ICD-10-CM | POA: Diagnosis not present

## 2016-12-26 ENCOUNTER — Ambulatory Visit
Admission: RE | Admit: 2016-12-26 | Discharge: 2016-12-26 | Disposition: A | Discharge status: Home / Self Care | Payer: BLUE CROSS/BLUE SHIELD | Source: Ambulatory Visit | Attending: Radiation Oncology | Admitting: Radiation Oncology

## 2016-12-26 ENCOUNTER — Other Ambulatory Visit: Payer: Self-pay | Admitting: Radiation Oncology

## 2016-12-26 DIAGNOSIS — Z510 Encounter for antineoplastic radiation therapy: Secondary | ICD-10-CM | POA: Diagnosis not present

## 2016-12-26 DIAGNOSIS — C50411 Malignant neoplasm of upper-outer quadrant of right female breast: Secondary | ICD-10-CM

## 2016-12-26 DIAGNOSIS — D496 Neoplasm of unspecified behavior of brain: Secondary | ICD-10-CM

## 2016-12-26 DIAGNOSIS — B370 Candidal stomatitis: Secondary | ICD-10-CM

## 2016-12-26 MED ORDER — EMOLLIENT BASE EX CREA
TOPICAL_CREAM | CUTANEOUS | 0 refills | Status: DC | PRN
Start: 2016-12-26 — End: 2017-02-18

## 2016-12-26 MED ORDER — NYSTATIN 100000 UNIT/ML MT SUSP: 5.0000 mL | Freq: Four times a day (QID) | OROMUCOSAL | 1 refills | Status: DC

## 2016-12-26 MED ORDER — BIAFINE EX EMUL
Freq: Every day | CUTANEOUS | Status: DC
  Administered 2016-12-26: 16:00:00 via TOPICAL

## 2016-12-26 NOTE — Progress Notes (Signed)
The patient was seen for evaluation today due to concerns for thrush. On exam she has some white plaquing on her tongue and along the sides of the buccal mucosa. She is taking Dexamethasone 2 mg BID. She was advised to start nystatin suspension which was called into her pharmacy and to notify us if she had persistent symptoms.

## 2016-12-26 NOTE — Progress Notes (Signed)
Pt education done, my business card, radiation threapy and you book , biafine cream given to patient, brain, ways to manage side effects, skin irritation, head aches, nausea, hair loss , eye irritation, fatigue, possible hearing difficulty, use biafine after rad tx to back of head, daily, and prn, pt c/o nausea, asked PA Bryson Ha Perkiins to see the patient and to write rx for nausea,  ,she also looks like she has thrush, on her tongue, she is taking decadron , encouraged to make sure she brushes her tongue daily as well ,teach back given 3:50 PM  3:50 PM

## 2016-12-27 ENCOUNTER — Ambulatory Visit
Admission: RE | Admit: 2016-12-27 | Discharge: 2016-12-27 | Disposition: A | Discharge status: Home / Self Care | Payer: BLUE CROSS/BLUE SHIELD | Source: Ambulatory Visit | Attending: Radiation Oncology | Admitting: Radiation Oncology

## 2016-12-27 DIAGNOSIS — Z510 Encounter for antineoplastic radiation therapy: Secondary | ICD-10-CM | POA: Diagnosis not present

## 2016-12-28 ENCOUNTER — Telehealth: Payer: Self-pay | Admitting: Hematology and Oncology

## 2016-12-28 ENCOUNTER — Ambulatory Visit
Admission: RE | Admit: 2016-12-28 | Discharge: 2016-12-28 | Disposition: A | Discharge status: Home / Self Care | Payer: BLUE CROSS/BLUE SHIELD | Source: Ambulatory Visit | Attending: Radiation Oncology | Admitting: Radiation Oncology

## 2016-12-28 DIAGNOSIS — Z510 Encounter for antineoplastic radiation therapy: Secondary | ICD-10-CM | POA: Diagnosis not present

## 2016-12-28 NOTE — Telephone Encounter (Signed)
Attempted to message for patient regarding appts added per 9/27 sch msg. Their voicemailbox is full so I am sending her a confirmation letter.

## 2016-12-31 ENCOUNTER — Ambulatory Visit: Payer: BLUE CROSS/BLUE SHIELD

## 2016-12-31 ENCOUNTER — Emergency Department (HOSPITAL_COMMUNITY)
Admission: EM | Admit: 2016-12-31 | Discharge: 2016-12-31 | Disposition: A | Discharge status: Home / Self Care | Payer: BLUE CROSS/BLUE SHIELD | Attending: Emergency Medicine | Admitting: Emergency Medicine

## 2016-12-31 ENCOUNTER — Emergency Department (HOSPITAL_COMMUNITY): Payer: BLUE CROSS/BLUE SHIELD

## 2016-12-31 ENCOUNTER — Encounter (HOSPITAL_COMMUNITY): Payer: Self-pay | Admitting: *Deleted

## 2016-12-31 DIAGNOSIS — Z79899 Other long term (current) drug therapy: Secondary | ICD-10-CM | POA: Insufficient documentation

## 2016-12-31 DIAGNOSIS — R531 Weakness: Secondary | ICD-10-CM | POA: Diagnosis present

## 2016-12-31 DIAGNOSIS — E860 Dehydration: Secondary | ICD-10-CM | POA: Diagnosis not present

## 2016-12-31 DIAGNOSIS — I10 Essential (primary) hypertension: Secondary | ICD-10-CM | POA: Insufficient documentation

## 2016-12-31 DIAGNOSIS — N390 Urinary tract infection, site not specified: Secondary | ICD-10-CM | POA: Insufficient documentation

## 2016-12-31 DIAGNOSIS — E785 Hyperlipidemia, unspecified: Secondary | ICD-10-CM | POA: Insufficient documentation

## 2016-12-31 LAB — COMPREHENSIVE METABOLIC PANEL
ALT: 17 U/L (ref 14–54)
AST: 18 U/L (ref 15–41)
Albumin: 3.5 g/dL (ref 3.5–5.0)
Alkaline Phosphatase: 38 U/L (ref 38–126)
Anion gap: 8 (ref 5–15)
BUN: 16 mg/dL (ref 6–20)
CO2: 28 mmol/L (ref 22–32)
Calcium: 9.1 mg/dL (ref 8.9–10.3)
Chloride: 101 mmol/L (ref 101–111)
Creatinine, Ser: 0.7 mg/dL (ref 0.44–1.00)
GFR calc Af Amer: >60 mL/min (ref >60)
GFR calc non Af Amer: >60 mL/min (ref >60)
Glucose, Bld: 180 mg/dL — ABNORMAL HIGH (ref 65–99)
Potassium: 3.7 mmol/L (ref 3.5–5.1)
Sodium: 137 mmol/L (ref 135–145)
Total Bilirubin: 0.6 mg/dL (ref 0.3–1.2)
Total Protein: 6.5 g/dL (ref 6.5–8.1)

## 2016-12-31 LAB — CBC WITH DIFFERENTIAL/PLATELET
Basophils Absolute: 0 10*3/uL (ref 0.0–0.1)
Basophils Relative: 0 %
Eosinophils Absolute: 0 10*3/uL (ref 0.0–0.7)
Eosinophils Relative: 0 %
HCT: 37.4 % (ref 36.0–46.0)
Hemoglobin: 12.7 g/dL (ref 12.0–15.0)
Lymphocytes Relative: 13 %
Lymphs Abs: 0.7 10*3/uL (ref 0.7–4.0)
MCH: 29.2 pg (ref 26.0–34.0)
MCHC: 34 g/dL (ref 30.0–36.0)
MCV: 86 fL (ref 78.0–100.0)
Monocytes Absolute: 0.4 10*3/uL (ref 0.1–1.0)
Monocytes Relative: 6 %
Neutro Abs: 4.6 10*3/uL (ref 1.7–7.7)
Neutrophils Relative %: 81 %
Platelets: 120 10*3/uL — ABNORMAL LOW (ref 150–400)
RBC: 4.35 MIL/uL (ref 3.87–5.11)
RDW: 12.9 % (ref 11.5–15.5)
WBC: 5.6 10*3/uL (ref 4.0–10.5)

## 2016-12-31 LAB — URINALYSIS, ROUTINE W REFLEX MICROSCOPIC
Bilirubin Urine: NEGATIVE
Glucose, UA: 50 mg/dL — AB
Ketones, ur: NEGATIVE mg/dL
Nitrite: NEGATIVE
Protein, ur: NEGATIVE mg/dL
Specific Gravity, Urine: 1.006 (ref 1.005–1.030)
pH: 7 (ref 5.0–8.0)

## 2016-12-31 LAB — I-STAT TROPONIN, ED: Troponin i, poc: 0.01 ng/mL (ref 0.00–0.08)

## 2016-12-31 LAB — I-STAT CG4 LACTIC ACID, ED: Lactic Acid, Venous: 1.68 mmol/L (ref 0.5–1.9)

## 2016-12-31 MED ORDER — DEXTROSE 5 % IV SOLN
1.0000 g | Freq: Once | INTRAVENOUS | Status: AC
  Administered 2016-12-31: 1 g via INTRAVENOUS
  Filled 2016-12-31: qty 10

## 2016-12-31 MED ORDER — MECLIZINE HCL 25 MG PO TABS: 25.0000 mg | ORAL_TABLET | Freq: Once | ORAL | Status: DC

## 2016-12-31 MED ORDER — FLUCONAZOLE 200 MG PO TABS: 200.0000 mg | ORAL_TABLET | Freq: Every day | ORAL | 0 refills | Status: AC

## 2016-12-31 MED ORDER — FLUCONAZOLE 150 MG PO TABS
150.0000 mg | ORAL_TABLET | Freq: Once | ORAL | Status: AC
  Administered 2016-12-31: 150 mg via ORAL
  Filled 2016-12-31: qty 1

## 2016-12-31 MED ORDER — CEPHALEXIN 500 MG PO CAPS
500.0000 mg | ORAL_CAPSULE | Freq: Three times a day (TID) | ORAL | 0 refills | Status: AC
Start: 2016-12-31 — End: 2017-01-07

## 2016-12-31 MED ORDER — MECLIZINE HCL 25 MG PO TABS
12.5000 mg | ORAL_TABLET | Freq: Once | ORAL | Status: DC
  Filled 2016-12-31: qty 1

## 2016-12-31 MED ORDER — SODIUM CHLORIDE 0.9 % IV BOLUS (SEPSIS)
500.0000 mL | Freq: Once | INTRAVENOUS | Status: AC
  Administered 2016-12-31: 500 mL via INTRAVENOUS

## 2016-12-31 MED ORDER — SODIUM CHLORIDE 0.9 % IV BOLUS (SEPSIS)
1000.0000 mL | Freq: Once | INTRAVENOUS | Status: AC
  Administered 2016-12-31: 1000 mL via INTRAVENOUS

## 2016-12-31 NOTE — ED Triage Notes (Addendum)
Per EMS, pt from home complains of increased weakness since waking up this morning. Pt's daughter states the pt was unable to support as much of her weight as usual. Pt is at baseline mental status, A&Ox4. Pt has breast cancer. Pt received radiation treatment last week.   BP 127/84 HR 60 RR 16 CBG 186 EMS 12 lead unremarkable
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
## 2016-12-31 NOTE — Discharge Instructions (Signed)
-   Start taking the antibiotic and antifungal medication tomorrow - Continue to NOT take the medications that Dr. Lindi Adie told you to stop/hold - Call Dr. Lindi Adie in the morning

## 2017-01-01 ENCOUNTER — Encounter
Payer: BLUE CROSS/BLUE SHIELD | Attending: Physical Medicine & Rehabilitation | Admitting: Physical Medicine & Rehabilitation

## 2017-01-01 ENCOUNTER — Telehealth: Payer: Self-pay | Admitting: *Deleted

## 2017-01-01 ENCOUNTER — Ambulatory Visit: Payer: BLUE CROSS/BLUE SHIELD

## 2017-01-01 NOTE — Telephone Encounter (Signed)
Patient daughter called, cancelling her momn's rad tx today, was d/c from the hospital yesterday, had dehydration, fatigue,not feeling well and not up to coming today, will be here tomorrow to resume rad txs, called [facility] on [facility],informed them of cancellation today 9:05 AM

## 2017-01-02 ENCOUNTER — Telehealth: Payer: Self-pay | Admitting: *Deleted

## 2017-01-02 ENCOUNTER — Ambulatory Visit
Admission: RE | Admit: 2017-01-02 | Discharge: 2017-01-02 | Disposition: A | Discharge status: Home / Self Care | Payer: BLUE CROSS/BLUE SHIELD | Source: Ambulatory Visit | Attending: Radiation Oncology | Admitting: Radiation Oncology

## 2017-01-02 ENCOUNTER — Ambulatory Visit: Payer: BLUE CROSS/BLUE SHIELD

## 2017-01-02 ENCOUNTER — Other Ambulatory Visit: Payer: Self-pay | Admitting: Radiation Oncology

## 2017-01-02 DIAGNOSIS — C50919 Malignant neoplasm of unspecified site of unspecified female breast: Secondary | ICD-10-CM

## 2017-01-02 DIAGNOSIS — C50911 Malignant neoplasm of unspecified site of right female breast: Secondary | ICD-10-CM

## 2017-01-02 DIAGNOSIS — Z510 Encounter for antineoplastic radiation therapy: Secondary | ICD-10-CM | POA: Diagnosis not present

## 2017-01-02 DIAGNOSIS — C7931 Secondary malignant neoplasm of brain: Secondary | ICD-10-CM

## 2017-01-02 LAB — URINE CULTURE

## 2017-01-02 MED ORDER — DEXAMETHASONE 2 MG PO TABS
2.0000 mg | ORAL_TABLET | Freq: Two times a day (BID) | ORAL | 1 refills | Status: DC
Start: 2017-01-02 — End: 2017-02-12

## 2017-01-02 NOTE — Plan of Care (Signed)
Problem: RH Car Transfers Goal: LTG Patient will perform car transfers with assist (PT) LTG: Patient will perform car transfers with assistance (PT).  Outcome: Completed/Met Date Met: 01/02/17 With cane  Problem: RH Ambulation Goal: LTG Patient will ambulate in home environment (PT) LTG: Patient will ambulate in home environment, # of feet with assistance (PT).  Outcome: Completed/Met Date Met: 01/02/17 50 ft with cane Goal: LTG Patient will ambulate in community environment (PT) LTG: Patient will ambulate in community environment, # of feet with assistance (PT).  Outcome: Completed/Met Date Met: 01/02/17 150 ft with cane  Problem: RH Stairs Goal: LTG Patient will ambulate up and down stairs w/assist (PT) LTG: Patient will ambulate up and down # of stairs with assistance (PT)  Outcome: Completed/Met Date Met: 01/02/17 16 steps with 1 rail

## 2017-01-02 NOTE — Telephone Encounter (Signed)
Daughter left vm that patient cancelled todays rad tx still not feeling well but will come tomorrow for sure and patient needs refill on her decadron, only has 2 days worth left 11:50 AM

## 2017-01-02 NOTE — Telephone Encounter (Signed)
Returned call to daughter Joycie Peek,  Asked if same reason patient not coming for rad tx is weakness?, Yes,we live upstairs apt, and it is difficult for her to go up and down the stairs, asked if there was any way possible for her to come today ,even at a later time?, since this would be the 3rd day of missed treatment , needs to be consistent to be effective, daughter said they might so transferred call to [facility] and gave [place] patient daughter Joycie Peek on phone  to work out possibility of patient coming in today later. 11:59 AM

## 2017-01-02 NOTE — Telephone Encounter (Signed)
done

## 2017-01-03 ENCOUNTER — Ambulatory Visit
Admission: RE | Admit: 2017-01-03 | Discharge: 2017-01-03 | Disposition: A | Discharge status: Home / Self Care | Payer: BLUE CROSS/BLUE SHIELD | Source: Ambulatory Visit | Attending: Radiation Oncology | Admitting: Radiation Oncology

## 2017-01-03 ENCOUNTER — Other Ambulatory Visit: Payer: Self-pay | Admitting: Family

## 2017-01-03 DIAGNOSIS — Z510 Encounter for antineoplastic radiation therapy: Secondary | ICD-10-CM | POA: Diagnosis not present

## 2017-01-04 ENCOUNTER — Ambulatory Visit
Admission: RE | Admit: 2017-01-04 | Discharge: 2017-01-04 | Disposition: A | Discharge status: Home / Self Care | Payer: BLUE CROSS/BLUE SHIELD | Source: Ambulatory Visit | Attending: Radiation Oncology | Admitting: Radiation Oncology

## 2017-01-04 DIAGNOSIS — Z510 Encounter for antineoplastic radiation therapy: Secondary | ICD-10-CM | POA: Diagnosis not present

## 2017-01-04 NOTE — Progress Notes (Signed)
  Radiation Oncology         [phone] ________________________________  Name: Tasha Ortiz MRN: [id number]  Date: 12/18/2016  DOB: 20-Jun-1955    Simulation and treatment planning note  DIAGNOSIS:     ICD-10-CM   1. Brain metastasis ([place]) C79.31      The patient presented for simulation for the patient's upcoming course of radiation treatment to the posterior fossa. The patient was placed in a supine position and a customized thermoplastic head cast was constructed to aid in patient immobilization during the treatment. This complex treatment device will be used on a daily basis. In this fashion a CT scan was obtained through the head and neck region and isocenter was placed near midline within the brain.  The patient will be planned to receive a course of whole brain radiation treatment to a dose of 30 gray in 10 fractions at 3 gray per fraction. To accomplish this, 2 customized blocks have been designed which corresponds to left and right whole brain radiation fields. These 2 complex treatment devices will be used on a daily basis during the course of radiation. A complex isodose plan is requested to insure that the target area is adequately covered in to facilitate optimization of the treatment plan. A forward planning technique will also be evaluated to determine if this approach significantly improves the plan.   ________________________________   Jodelle Gross, MD, PhD

## 2017-01-07 ENCOUNTER — Other Ambulatory Visit: Payer: Self-pay | Admitting: Radiation Oncology

## 2017-01-07 ENCOUNTER — Ambulatory Visit
Admission: RE | Admit: 2017-01-07 | Discharge: 2017-01-07 | Disposition: A | Discharge status: Home / Self Care | Payer: BLUE CROSS/BLUE SHIELD | Source: Ambulatory Visit | Attending: Radiation Oncology | Admitting: Radiation Oncology

## 2017-01-07 ENCOUNTER — Telehealth: Payer: Self-pay | Admitting: *Deleted

## 2017-01-07 ENCOUNTER — Ambulatory Visit: Payer: BLUE CROSS/BLUE SHIELD

## 2017-01-07 DIAGNOSIS — C7931 Secondary malignant neoplasm of brain: Secondary | ICD-10-CM

## 2017-01-07 DIAGNOSIS — Z510 Encounter for antineoplastic radiation therapy: Secondary | ICD-10-CM | POA: Diagnosis not present

## 2017-01-07 DIAGNOSIS — C50411 Malignant neoplasm of upper-outer quadrant of right female breast: Secondary | ICD-10-CM

## 2017-01-07 MED ORDER — LEVETIRACETAM 500 MG PO TABS: 500.0000 mg | ORAL_TABLET | Freq: Two times a day (BID) | ORAL | 0 refills | Status: DC

## 2017-01-07 NOTE — Discharge Summary (Signed)
Occupational Therapy Discharge Summary  Patient Details  Name: Tasha Ortiz MRN: [id number] Date of Birth: 05/08/1955  Late Entry; d/c from CIR 12/05/16  Patient has met 9 of 9 long term goals due to improved activity tolerance, improved balance, postural control, ability to compensate for deficits, improved awareness and improved coordination.  Patient to discharge at overall Supervision level.  Patient's  daughter has participated in family education in order to provide the necessary physical and cognitive assistance at discharge.    All goals met.   Recommendation:  Patient will benefit from ongoing skilled OT services in home health setting to continue to advance functional skills in the area of BADL and Vocation.  Equipment: TTB   Reasons for discharge: treatment goals met  Patient/family agrees with progress made and goals achieved: Yes  OT Discharge Precautions/Restrictions  Precautions Precautions: Fall Precaution Comments: Ataxic ADL ADL ADL Comments: Please see functional navigator on 12/03/16 for ADL status at d/c Vision Baseline Vision/History: Wears glasses Wears Glasses: Reading only Patient Visual Report: Blurring of vision;Diplopia (Discussed seeing eye doctor at d/c to follow up with persistent blurry vision) Vision Assessment?:  (Able to read large font on ADL labels, distinguish colors, and read safety/hazard signs in hallway) Cognition Arousal/Alertness: Awake/alert Orientation Level: Oriented X4 Attention: Sustained Awareness: Appears intact Safety/Judgment: Appears intact Motor  Motor Motor: Other (comment);Abnormal postural alignment and control;Ataxia Motor - Discharge Observations: Improved postural control from eval, but postural/balance deficits persist  Mobility  Transfers Transfers: Stand to Sit Sit to Stand: 5: Supervision Stand to Sit: 5: Supervision  Trunk/Postural Assessment  Postural Control Postural Control: Deficits on evaluation   Balance Balance Balance Assessed: Yes Dynamic Sitting Balance Dynamic Sitting - Level of Assistance: 5: Stand by assistance (during self care tasks) Dynamic Standing Balance Dynamic Standing - Level of Assistance: 5: Stand by assistance Dynamic Standing - Comments:  (during self care tasks) Extremity/Trunk Assessment RUE Assessment RUE Assessment: Within Functional Limits (4-/5 MMT) LUE Assessment LUE Assessment: Within Functional Limits (4-/5 MMT)   See Function Navigator for Current Functional Status.   A  01/07/2017, 8:24 PM  

## 2017-01-07 NOTE — Telephone Encounter (Signed)
Daughter called requesting keppra refill to patient' pharmacy,  [facility] home phone number is [phone] [place] office didn't prescribe it , will forward this to Shona Simpson, our PA-C,  3:21 PM

## 2017-01-07 NOTE — Telephone Encounter (Signed)
Tasha Ortiz- I refilled her Keppra- she didn't have a seizure can you ask Dr. [name] how long she needs to stay on this?

## 2017-01-07 NOTE — Telephone Encounter (Signed)
Called daughter Joycie Peek, rx for Keppra e-scribed per Verne Grain PA-C 1 rx for 30 days, patient will have to have another MD,neuro to manage , daughter thanked Alsion and for the call, she soul wait a couple hours  And call [facility] to see if it is ready 3:38 PM

## 2017-01-08 ENCOUNTER — Ambulatory Visit: Payer: BLUE CROSS/BLUE SHIELD

## 2017-01-08 ENCOUNTER — Telehealth: Payer: Self-pay | Admitting: *Deleted

## 2017-01-08 ENCOUNTER — Ambulatory Visit
Admission: RE | Admit: 2017-01-08 | Discharge: 2017-01-08 | Disposition: A | Discharge status: Home / Self Care | Payer: BLUE CROSS/BLUE SHIELD | Source: Ambulatory Visit | Attending: Radiation Oncology | Admitting: Radiation Oncology

## 2017-01-08 DIAGNOSIS — Z510 Encounter for antineoplastic radiation therapy: Secondary | ICD-10-CM | POA: Diagnosis not present

## 2017-01-08 NOTE — Telephone Encounter (Signed)
Spoke with Torie daughter,still no rx for keppra, informed her I would speakto pharmacist personally and give refill request for keppra,called [facility] [phone] spoke with tammy, gave rx  For Keppra:500mg   Take 1 tabet(500mg  total) bid ,dispense 60 tabs no refill, they will text patient when ready thanked Tammy 9:19 AM\

## 2017-01-08 NOTE — Telephone Encounter (Signed)
Called [facility] and [facility] 9:21 AM

## 2017-01-09 ENCOUNTER — Other Ambulatory Visit: Payer: Self-pay | Admitting: Family

## 2017-01-09 ENCOUNTER — Ambulatory Visit: Payer: BLUE CROSS/BLUE SHIELD

## 2017-01-09 ENCOUNTER — Ambulatory Visit
Admission: RE | Admit: 2017-01-09 | Discharge: 2017-01-09 | Disposition: A | Discharge status: Home / Self Care | Payer: BLUE CROSS/BLUE SHIELD | Source: Ambulatory Visit | Attending: Radiation Oncology | Admitting: Radiation Oncology

## 2017-01-09 DIAGNOSIS — Z22322 Carrier or suspected carrier of Methicillin resistant Staphylococcus aureus: Secondary | ICD-10-CM | POA: Diagnosis not present

## 2017-01-09 DIAGNOSIS — G8191 Hemiplegia, unspecified affecting right dominant side: Secondary | ICD-10-CM | POA: Diagnosis not present

## 2017-01-09 DIAGNOSIS — Z9011 Acquired absence of right breast and nipple: Secondary | ICD-10-CM | POA: Diagnosis not present

## 2017-01-09 DIAGNOSIS — Z853 Personal history of malignant neoplasm of breast: Secondary | ICD-10-CM | POA: Diagnosis not present

## 2017-01-09 DIAGNOSIS — I10 Essential (primary) hypertension: Secondary | ICD-10-CM

## 2017-01-09 DIAGNOSIS — Z48811 Encounter for surgical aftercare following surgery on the nervous system: Secondary | ICD-10-CM | POA: Diagnosis not present

## 2017-01-09 DIAGNOSIS — Z87891 Personal history of nicotine dependence: Secondary | ICD-10-CM | POA: Diagnosis not present

## 2017-01-09 DIAGNOSIS — Z510 Encounter for antineoplastic radiation therapy: Secondary | ICD-10-CM | POA: Diagnosis not present

## 2017-01-09 DIAGNOSIS — Z79810 Long term (current) use of selective estrogen receptor modulators (SERMs): Secondary | ICD-10-CM | POA: Diagnosis not present

## 2017-01-09 DIAGNOSIS — K219 Gastro-esophageal reflux disease without esophagitis: Secondary | ICD-10-CM | POA: Diagnosis not present

## 2017-01-09 DIAGNOSIS — D496 Neoplasm of unspecified behavior of brain: Secondary | ICD-10-CM | POA: Diagnosis not present

## 2017-01-09 DIAGNOSIS — R2689 Other abnormalities of gait and mobility: Secondary | ICD-10-CM | POA: Diagnosis not present

## 2017-01-10 ENCOUNTER — Encounter: Payer: Self-pay | Admitting: Radiation Oncology

## 2017-01-10 ENCOUNTER — Ambulatory Visit: Payer: BLUE CROSS/BLUE SHIELD

## 2017-01-14 ENCOUNTER — Ambulatory Visit: Payer: BLUE CROSS/BLUE SHIELD | Admitting: Hematology and Oncology

## 2017-01-14 NOTE — Progress Notes (Signed)
The [facility] FMLA paperwork has been faxed on 01/10/2017.

## 2017-01-17 ENCOUNTER — Telehealth: Payer: Self-pay | Admitting: Radiation Therapy

## 2017-01-17 ENCOUNTER — Encounter: Payer: Self-pay | Admitting: Radiation Oncology

## 2017-01-17 NOTE — Progress Notes (Signed)
  Radiation Oncology         (336) [phone] ________________________________  Name: Tasha Ortiz MRN: [id number]  Date: 01/17/2017  DOB: 1955/06/20  End of Treatment Note  Diagnosis:   Secondary malignant neoplasm of brain  Indication for treatment:  Curative     Radiation treatment dates:   12/25/2016 - 01/09/2017  Site/dose:   Brain - posterior fossa / 30 Gy in 10 fractions  Beams/energy:  Photon/10X  Narrative: The patient tolerated radiation treatment relatively well.     Plan: The patient has completed radiation treatment. The patient will return to radiation oncology clinic for routine followup in one month. I advised them to call or return sooner if they have any questions or concerns related to their recovery or treatment.  ------------------------------------------------  Jodelle Gross, MD, PhD  This document serves as a record of services personally performed by Kyung Rudd, MD. It was created on his behalf by Valeta Harms, a trained medical scribe. The creation of this record is based on the scribe's personal observations and the provider's statements to them. This document has been checked and approved by the attending provider.

## 2017-01-17 NOTE — Telephone Encounter (Signed)
Error

## 2017-01-17 NOTE — Telephone Encounter (Signed)
I spoke with Tasha Ortiz's daughter about her Keppra Rx. Per Dr. [name] she is to decrease her dose to once per day x 1 week and then discontinue taking the medication. She also verified that the patient had her surgical staples/sutures removed at the time of discharge from the hospital.   [name] R.T.(R)(T) Special Procedures Navigator

## 2017-01-19 ENCOUNTER — Other Ambulatory Visit: Payer: Self-pay | Admitting: Family

## 2017-01-19 DIAGNOSIS — C50411 Malignant neoplasm of upper-outer quadrant of right female breast: Secondary | ICD-10-CM

## 2017-01-22 ENCOUNTER — Encounter: Payer: BLUE CROSS/BLUE SHIELD | Admitting: Genetics

## 2017-01-22 ENCOUNTER — Other Ambulatory Visit: Payer: BLUE CROSS/BLUE SHIELD

## 2017-01-29 ENCOUNTER — Telehealth: Payer: Self-pay | Admitting: *Deleted

## 2017-01-29 ENCOUNTER — Telehealth: Payer: Self-pay

## 2017-01-29 NOTE — Telephone Encounter (Signed)
Called and spoke with daughter Tasha Ortiz regarding pt symptoms. Pt experiencing neck pain in the morning. Daughter states that the known fluid in her incision site has travelled away and down the base of her neck, which causes discomfort and pain. Pt experiencing some nausea, and baseline dizziness as well. Denies any fever, redness, or oozing around the incision site. Pt speech intact and has no other complaints, other than pain in her neck and fluid traveling away from incision site.   Notified Alean Rinne and is aware. Instructed to call Dr.Jenkins for further evaluation of incision site. Dr.Gudena made aware as well. Told daughter that Radonc advise to call surgeon today to see if they can see pt to assess incision. Pt daughter verbalized understanding and will call neurosurgeon's office now. [name] requesting this RN to call and notify her sister, Tasha Ortiz, who is with pt right now of what the plan is.   Called Tasha Ortiz and detailed vm on her phone to call surgeon's office now and that Radonc/ medonc MD are aware of pt symptoms. If they have any more questions, daughter may call radonc back.

## 2017-01-29 NOTE — Telephone Encounter (Signed)
Baker Janus from answering service transferred dtr. Leonides Sake to me.  Call back is [phone].  Peter Congo states that neck pain increasing.  She does have know pocket of fluid in her neck that has not increased but moved down.  She has no fever or nausea.  She missed lab appt on 10/23 b/c she did not feel well.  This has not been rescheduled.  Called May/ RN for Dr. Lindi Adie and she will call dtr.

## 2017-02-01 ENCOUNTER — Other Ambulatory Visit: Payer: Self-pay | Admitting: Internal Medicine

## 2017-02-01 ENCOUNTER — Other Ambulatory Visit: Payer: Self-pay | Admitting: *Deleted

## 2017-02-12 ENCOUNTER — Telehealth: Payer: Self-pay

## 2017-02-12 ENCOUNTER — Ambulatory Visit (HOSPITAL_BASED_OUTPATIENT_CLINIC_OR_DEPARTMENT_OTHER): Payer: BLUE CROSS/BLUE SHIELD | Admitting: Hematology and Oncology

## 2017-02-12 ENCOUNTER — Other Ambulatory Visit: Payer: Self-pay

## 2017-02-12 ENCOUNTER — Telehealth: Payer: Self-pay | Admitting: Hematology and Oncology

## 2017-02-12 DIAGNOSIS — Z79811 Long term (current) use of aromatase inhibitors: Secondary | ICD-10-CM

## 2017-02-12 DIAGNOSIS — C773 Secondary and unspecified malignant neoplasm of axilla and upper limb lymph nodes: Secondary | ICD-10-CM

## 2017-02-12 DIAGNOSIS — C7931 Secondary malignant neoplasm of brain: Secondary | ICD-10-CM

## 2017-02-12 DIAGNOSIS — C50411 Malignant neoplasm of upper-outer quadrant of right female breast: Secondary | ICD-10-CM

## 2017-02-12 NOTE — Telephone Encounter (Signed)
Gave patient avs and calendar with appts per 11/13 los.

## 2017-02-12 NOTE — Assessment & Plan Note (Signed)
Metastatic breast cancer with brain metastases: Cerebellar lesion 2.9 cm in size status post gross total resection and Dr. [name] on 11/21/2016  (Right breast invasive ductal carcinoma multifocal disease ER/PR positive HER-2 negative T2, N1, M0 stage IIB status post mastectomy, adjuvant chemotherapy and radiation now on antiestrogen therapy with tamoxifen since October 2014.)  Tamoxifen toxicities:Patient tolerated tamoxifen extremely well without any major problems or concerns. However because of the brain metastases, I recommended switching her to letrozole therapy.  Letrozole toxicities:  Return to clinic in 3 months with scans and follow-up

## 2017-02-12 NOTE — Telephone Encounter (Signed)
Received a call from [facility] stating that they are out of network with pt insurance at this time and will not be able to provide pt/ot services. They suggested to try [facility] or [facility]. Called [facility] and made sure they accepted pt insurance and that they have availability for PT/OT. Sent fax to [phone] Arville Go, [facility]).

## 2017-02-12 NOTE — Telephone Encounter (Signed)
Unfortunately, [facility] is unable to provide OT services at this time. Called and spoke with [facility]. Faxed over order,face sheet and last report to [phone] [facility].

## 2017-02-12 NOTE — Progress Notes (Signed)
Patient Care Team: Golden Circle, FNP as PCP - General (Family Medicine)  DIAGNOSIS:  Encounter Diagnosis  Name Primary?  . Primary cancer of upper outer quadrant of right female breast ([place])     SUMMARY OF ONCOLOGIC HISTORY:   Primary cancer of upper outer quadrant of right female breast ([place])   04/16/2012 Initial Biopsy    Right breast mass biopsy invasive ductal carcinoma with abundant mucin in all the 3 masses, right axillary lymph node biopsy positive for IDC, ER 100% PR percent Ki-67 19%, HER-2 negative ratio 0.94      04/21/2012 Breast MRI    Right breast 3 masses 11o'clock position 1.8 cm, now 11:00 position 2 cm, and 10:00 position 2.3 cm with an abnormal level I right axillary lymph node 3 cm      05/20/2012 Surgery    Right breast mastectomy invasive ductal carcinoma grade 2; 1.9 cm, 2.5 cm, 1.8 cm, 1/17 LN positive, second and third massive grade 3      06/24/2012 - 10/07/2012 Chemotherapy    Adjuvant chemotherapy with Taxotere Cytoxan x6 cycles      10/16/2012 - 12/16/2012 Radiation Therapy    Adjuvant radiation therapy by Dr. [name]      01/07/2013 -  Anti-estrogen oral therapy    Tamoxifen 20 mg daily      11/19/2016 Imaging    MRI brain: 2.9 similar mass within the inferior vermis with surrounding edema and local mass effect partially effacing the fourth ventricle.       11/21/2016 Relapse/Recurrence    Brain biopsy: Metastatic ductal carcinoma the breast positive for ER PR, GATA 3, negative for PAX8 and TTF-1      11/21/2016 Surgery    Suboccipital craniotomy for gross total resection of cerebellar tumor by Dr. [name]       CHIEF COMPLIANT: Patient is in a wheelchair, having low blood pressure issues  INTERVAL HISTORY: Tasha Ortiz is a 61 year old with above-mentioned history of metastatic breast cancer with brain metastases who is here for a routine follow-up.  She is currently off dexamethasone and feels weak and tired as well as low in  appetite.  Her blood pressure today is running low.  Denies any headaches or blurred vision.  She has weakness in her hands and legs.  REVIEW OF SYSTEMS:   Constitutional: Denies fevers, chills or abnormal weight loss Eyes: Denies blurriness of vision Ears, nose, mouth, throat, and face: Denies mucositis or sore throat Respiratory: Denies cough, dyspnea or wheezes Cardiovascular: Denies palpitation, chest discomfort Gastrointestinal:  Denies nausea, heartburn or change in bowel habits Skin: Denies abnormal skin rashes Lymphatics: Denies new lymphadenopathy or easy bruising Neurological: Diffuse weakness in hands and legs Behavioral/Psych: Mood is stable, no new changes  Extremities: No lower extremity edema All other systems were reviewed with the patient and are negative.  I have reviewed the past medical history, past surgical history, social history and family history with the patient and they are unchanged from previous note.  ALLERGIES:  is allergic to lisinopril.  MEDICATIONS:  Current Outpatient Medications  Medication Sig Dispense Refill  . acetaminophen (TYLENOL) 325 MG tablet Take 650 mg by mouth every 6 (six) hours as needed for moderate pain.    Marland Kitchen amLODipine (NORVASC) 10 MG tablet Take 1 tablet (10 mg total) by mouth daily. 90 tablet 3  . amLODipine (NORVASC) 5 MG tablet TAKE 1 TABLET BY MOUTH EVERY DAY 90 tablet 1  . anastrozole (ARIMIDEX) 1 MG tablet Take  1 tablet (1 mg total) by mouth daily. 90 tablet 3  . cetirizine (ZYRTEC) 10 MG tablet Take 1 tablet (10 mg total) by mouth daily. (Patient taking differently: Take 10 mg by mouth daily as needed for allergies. ) 30 tablet 11  . cyclobenzaprine (FLEXERIL) 5 MG tablet TAKE 1 TABLET BY MOUTH THREE TIMES A DAY AS NEEDED FOR MUSCLE SPASMS  1  . dexamethasone (DECADRON) 2 MG tablet Take 1 tablet (2 mg total) by mouth every 12 (twelve) hours. 30 tablet 1  . docusate sodium (COLACE) 100 MG capsule Take 1 capsule (100 mg total) by  mouth 2 (two) times daily. (Patient not taking: Reported on 12/31/2016) 10 capsule 0  . emollient (BIAFINE) cream Apply topically as needed. (Patient not taking: Reported on 12/31/2016) 454 g 0  . fluticasone (FLONASE) 50 MCG/ACT nasal spray Place 2 sprays into both nostrils daily. (Patient taking differently: Place 2 sprays into both nostrils daily as needed for allergies. ) 16 g 3  . hydrochlorothiazide (HYDRODIURIL) 25 MG tablet TAKE 1 TABLET BY MOUTH EVERY DAY 90 tablet 1  . HYDROcodone-acetaminophen (NORCO/VICODIN) 5-325 MG tablet Take 1 tablet by mouth every 4 (four) hours as needed for moderate pain. 30 tablet 0  . levETIRAcetam (KEPPRA) 500 MG tablet Take 1 tablet (500 mg total) by mouth 2 (two) times daily. 60 tablet 0  . meclizine (ANTIVERT) 12.5 MG tablet Take 1 tablet (12.5 mg total) by mouth 3 (three) times daily as needed for dizziness. 30 tablet 1  . meclizine (ANTIVERT) 12.5 MG tablet TAKE 1 TABLET (12.5 MG TOTAL) BY MOUTH 3 (THREE) TIMES DAILY AS NEEDED FOR DIZZINESS. 30 tablet 1  . nystatin (MYCOSTATIN) 100000 UNIT/ML suspension Take 5 mLs (500,000 Units total) by mouth 4 (four) times daily. Swish and spit 60 mL 1  . pantoprazole (PROTONIX) 40 MG tablet Take 1 tablet (40 mg total) by mouth daily. (Patient not taking: Reported on 12/11/2016) 30 tablet 0  . pravastatin (PRAVACHOL) 40 MG tablet Take 1 tablet (40 mg total) by mouth daily. Annual appt is due w/labs must see provider for future refills 30 tablet 0  . pravastatin (PRAVACHOL) 40 MG tablet TAKE 1 TABLET BY MOUTH DAILY. DUE FOR APPOINTMENT 30 tablet 0   No current facility-administered medications for this visit.     PHYSICAL EXAMINATION: ECOG PERFORMANCE STATUS: 1 - Symptomatic but completely ambulatory  Vitals:   02/12/17 1016  BP: (!) 78/51  Pulse: (!) 112  Resp: 16  Temp: 98 F (36.7 C)  SpO2: 94%   Filed Weights   02/12/17 1016  Weight: 142 lb 4.8 oz (64.5 kg)    GENERAL:alert, no distress and  comfortable SKIN: skin color, texture, turgor are normal, no rashes or significant lesions EYES: normal, Conjunctiva are pink and non-injected, sclera clear OROPHARYNX:no exudate, no erythema and lips, buccal mucosa, and tongue normal  NECK: supple, thyroid normal size, non-tender, without nodularity LYMPH:  no palpable lymphadenopathy in the cervical, axillary or inguinal LUNGS: clear to auscultation and percussion with normal breathing effort HEART: regular rate & rhythm and no murmurs and no lower extremity edema ABDOMEN:abdomen soft, non-tender and normal bowel sounds MUSCULOSKELETAL:no cyanosis of digits and no clubbing  NEURO: alert & oriented x 3 with fluent speech, generalized weakness EXTREMITIES: No lower extremity edema  LABORATORY DATA:  I have reviewed the data as listed   Chemistry      Component Value Date/Time   NA 137 12/31/2016 1100   NA 143 07/05/2014 1101  K 3.7 12/31/2016 1100   K 3.7 07/05/2014 1101   CL 101 12/31/2016 1100   CL 103 09/23/2012 1040   CO2 28 12/31/2016 1100   CO2 27 07/05/2014 1101   BUN 16 12/31/2016 1100   BUN 11.8 07/05/2014 1101   CREATININE 0.70 12/31/2016 1100   CREATININE 0.8 07/05/2014 1101      Component Value Date/Time   CALCIUM 9.1 12/31/2016 1100   CALCIUM 9.4 07/05/2014 1101   ALKPHOS 38 12/31/2016 1100   ALKPHOS 45 07/05/2014 1101   AST 18 12/31/2016 1100   AST 18 07/05/2014 1101   ALT 17 12/31/2016 1100   ALT 15 07/05/2014 1101   BILITOT 0.6 12/31/2016 1100   BILITOT 0.21 07/05/2014 1101       Lab Results  Component Value Date   WBC 5.6 12/31/2016   HGB 12.7 12/31/2016   HCT 37.4 12/31/2016   MCV 86.0 12/31/2016   PLT 120 (L) 12/31/2016   NEUTROABS 4.6 12/31/2016    ASSESSMENT & PLAN:  Primary cancer of upper outer quadrant of right female breast ([place]) Metastatic breast cancer with brain metastases: Cerebellar lesion 2.9 cm in size status post gross total resection and Dr. [name] on 11/21/2016  (Right  breast invasive ductal carcinoma multifocal disease ER/PR positive HER-2 negative T2, N1, M0 stage IIB status post mastectomy, adjuvant chemotherapy and radiation now on antiestrogen therapy with tamoxifen since October 2014.)  Because of the brain metastases, we switched her to letrozole therapy.  Letrozole toxicities: Patient denies any hot flashes or myalgias. Our plan is to obtain CT chest abdomen pelvis in February and follow-up after that.  Decreased appetite: I resumed dexamethasone half a tablet a day. Patient is seeing radiation oncology to determine the frequency of brain MRIs.  Return to clinic in 3 months with scans and follow-up  I spent 25 minutes talking to the patient of which more than half was spent in counseling and coordination of care.  No orders of the defined types were placed in this encounter.  The patient has a good understanding of the overall plan. she agrees with it. she will call with any problems that may develop before the next visit here.   Rulon Eisenmenger, MD 02/12/17

## 2017-02-12 NOTE — Progress Notes (Signed)
Per Dr.Gudena, referral was sent for home health. Contacted and spoke with Santiago Glad from [facility] referral for PT/OT.

## 2017-02-13 ENCOUNTER — Telehealth: Payer: Self-pay | Admitting: Family

## 2017-02-13 NOTE — Telephone Encounter (Signed)
FYI PT will start 11/16

## 2017-02-14 NOTE — Telephone Encounter (Signed)
Does not look like pt has establish with a new provider.

## 2017-02-15 ENCOUNTER — Telehealth: Payer: Self-pay

## 2017-02-15 MED ORDER — MECLIZINE HCL 12.5 MG PO TABS: 12.5000 mg | ORAL_TABLET | Freq: Three times a day (TID) | ORAL | 1 refills | Status: DC | PRN

## 2017-02-15 NOTE — Telephone Encounter (Signed)
Done erx 

## 2017-02-15 NOTE — Telephone Encounter (Signed)
Pharmacy requested Meclizine refill but it is not listed as a med in the chart. Please advise.

## 2017-02-15 NOTE — Addendum Note (Signed)
Addended by: Biagio Borg on: 02/15/2017 01:21 PM   Modules accepted: Orders

## 2017-02-18 ENCOUNTER — Other Ambulatory Visit: Payer: Self-pay

## 2017-02-18 ENCOUNTER — Ambulatory Visit
Admission: RE | Admit: 2017-02-18 | Discharge: 2017-02-18 | Disposition: A | Discharge status: Home / Self Care | Payer: BLUE CROSS/BLUE SHIELD | Source: Ambulatory Visit | Attending: Radiation Oncology | Admitting: Radiation Oncology

## 2017-02-18 ENCOUNTER — Encounter: Payer: Self-pay | Admitting: Radiation Oncology

## 2017-02-18 VITALS — BP 130/89 | HR 72 | Temp 98.4°F | Resp 18 | Ht 64.0 in | Wt 142.0 lb

## 2017-02-18 DIAGNOSIS — C7931 Secondary malignant neoplasm of brain: Secondary | ICD-10-CM

## 2017-02-18 DIAGNOSIS — D496 Neoplasm of unspecified behavior of brain: Secondary | ICD-10-CM

## 2017-02-18 DIAGNOSIS — C50411 Malignant neoplasm of upper-outer quadrant of right female breast: Secondary | ICD-10-CM

## 2017-02-18 DIAGNOSIS — Z510 Encounter for antineoplastic radiation therapy: Secondary | ICD-10-CM | POA: Diagnosis not present

## 2017-02-18 MED ORDER — PRAVASTATIN SODIUM 40 MG PO TABS: 40.0000 mg | ORAL_TABLET | Freq: Every day | ORAL | 0 refills | Status: DC

## 2017-02-18 NOTE — Addendum Note (Signed)
Encounter addended by: Hayden Pedro, PA-C on: 02/18/2017 4:40 PM  Actions taken: Follow-up modified

## 2017-02-18 NOTE — Progress Notes (Signed)
Radiation Oncology         (336) [phone] ________________________________  Name: Tasha Ortiz MRN: [id number]  Date of Service: 02/18/2017 DOB: 12/03/1955  Post Treatment Note  CC: Golden Circle, FNP  Nicholas Lose, MD  Diagnosis:   Metastatic breast cancer to brain.   Interval Since Last Radiation: 6 weeks   12/25/2016 - 01/09/2017: Posterior fossa / 30 Gy in 10 fractions  10/02/12-12/06/12:  60.4 Gy to the right chest wall and regional nodes  Narrative:  The patient returns today for routine follow-up. In summary this patient has a history of metastatic breast cancer.  She was originally diagnosed with stage IIB, pT2bN1a, ER PR positive invasive ductal carcinoma of the right breast, she underwent mastectomy and adjuvant chemo with radiation and completed her treatment in 2014.  She developed headaches and was seen in August after an episode of the symptoms revealing a 29 x 28 x 27 mm mass in the left hemisphere of the cerebellum.  She underwent surgical resection on 11/21/2016, and final pathology was consistent with her history of breast cancer.  She went on to have postoperative treatment to the posterior fossa which she completed in October 2018.  She comes today for follow-up.  She has remained on Keppra, and was tapered off of Decadron.  She comes today for her post treatment follow-up.  On review of systems, the patient states she is feeling well. She notes occasional neck pain but reports her surgical site is doing well and she's not having any headaches. She has some blurry vision but reports this is intermittent. She is not having visual acuity concerns as a result. She is taking dexamethasone per Dr. Lindi Adie as an appetite stimulant. No other complaints are noted.  ALLERGIES:  is allergic to lisinopril.  Meds: Current Outpatient Medications  Medication Sig Dispense Refill  . acetaminophen (TYLENOL) 325 MG tablet Take 650 mg by mouth every 6 (six) hours as needed for  moderate pain.    Marland Kitchen amLODipine (NORVASC) 5 MG tablet TAKE 1 TABLET BY MOUTH EVERY DAY 90 tablet 1  . anastrozole (ARIMIDEX) 1 MG tablet Take 1 tablet (1 mg total) by mouth daily. 90 tablet 3  . emollient (BIAFINE) cream Apply topically as needed. (Patient not taking: Reported on 12/31/2016) 454 g 0  . fluticasone (FLONASE) 50 MCG/ACT nasal spray Place 2 sprays into both nostrils daily. (Patient taking differently: Place 2 sprays into both nostrils daily as needed for allergies. ) 16 g 3  . meclizine (ANTIVERT) 12.5 MG tablet Take 1 tablet (12.5 mg total) 3 (three) times daily as needed by mouth for dizziness. 30 tablet 1  . pravastatin (PRAVACHOL) 40 MG tablet Take 1 tablet (40 mg total) by mouth daily. Annual appt is due w/labs must see provider for future refills 30 tablet 0   No current facility-administered medications for this encounter.     Physical Findings:  height is 5\' 4"  (1.626 m) and weight is 142 lb (64.4 kg). Her oral temperature is 98.4 F (36.9 C). Her blood pressure is 130/89 and her pulse is 72. Her respiration is 18 and oxygen saturation is 100%.  Pain Assessment Pain Score: 2  Pain Loc: Neck/10 In general this is a well appearing African American female in no acute distress. She's alert and oriented x4 and appropriate throughout the examination. Cardiopulmonary assessment is negative for acute distress and she exhibits normal effort. Her posterior craniotomy site is intact with minimal fullness consistent with meningocele.   Lab  Findings: Lab Results  Component Value Date   WBC 5.6 12/31/2016   HGB 12.7 12/31/2016   HCT 37.4 12/31/2016   MCV 86.0 12/31/2016   PLT 120 (L) 12/31/2016     Radiographic Findings: No results found.  Impression/Plan: 1. Metastatic breast cancer to brain. The patient appears to be doing well overall, we discussed the rationale for posttreatment surveillance in the brain oncology clinic.  She will continue Arimidex and follow up with Dr.  Lindi Adie as well. We would recommend that she have repeat imaging in 2 months time, and we will present her case in conference.  I will plan to see her back afterwards to discuss these results. 2. Poor Appetite. The patient will continue dexamethasone per Dr. Lindi Adie. We will follow this expectantly.  3. Neck pain. This appears to respond to cyclobenzaprine. She will continue with her PCP and let us know as well as Dr. [name] if her symptoms become acute.      Carola Rhine, PAC

## 2017-02-19 ENCOUNTER — Other Ambulatory Visit: Payer: Self-pay

## 2017-02-19 NOTE — Addendum Note (Signed)
Encounter addended by: Malena Edman, RN on: 02/19/2017 3:49 PM  Actions taken: Charge Capture section accepted

## 2017-02-25 ENCOUNTER — Telehealth: Payer: Self-pay

## 2017-02-25 NOTE — Telephone Encounter (Signed)
Called pt occupational therapist and gave verbal approval for OT 2x a week for 6 weeks.  Cyndia Bent RN

## 2017-02-26 ENCOUNTER — Telehealth: Payer: Self-pay | Admitting: Family

## 2017-02-26 NOTE — Telephone Encounter (Signed)
Copied from [place] [phone]. Topic: Quick Communication - See Telephone Encounter >> Feb 26, 2017  2:33 PM Boyd Kerbs wrote: CRM for notification. See Telephone encounter for:  Tasha Ortiz [phone]  Physical Therapist requesting Social Worker Order (Verbal) for resources and planning, patient may need to move to 1st floor apt. For access. Needs home care (nurse) throughout the day and financial resources. 02/26/17.

## 2017-02-26 NOTE — Telephone Encounter (Signed)
Called patient and she informed me to disregard all of this because her oncologists is handling all of this.  Was notified of Tasha Ortiz no longer being here

## 2017-03-12 ENCOUNTER — Ambulatory Visit: Payer: BLUE CROSS/BLUE SHIELD | Admitting: Hematology and Oncology

## 2017-03-12 NOTE — Assessment & Plan Note (Deleted)
Metastatic breast cancer with brain metastases: Cerebellar lesion 2.9 cm in size status post gross total resection and Dr. [name] on 11/21/2016  (Right breast invasive ductal carcinoma multifocal disease ER/PR positive HER-2 negative T2, N1, M0 stage IIB status post mastectomy, adjuvant chemotherapy and radiation now on antiestrogen therapy with tamoxifen since October 2014.)  Because of the brain metastases, we switched her to letrozole therapy.  Letrozole toxicities: Patient denies any hot flashes or myalgias. Our plan is to obtain CT chest abdomen pelvis in February and follow-up after that.  Decreased appetite: I resumed dexamethasone half a tablet a day.  Return to clinic in 2 months with scans and follow-up

## 2017-03-17 ENCOUNTER — Telehealth: Payer: Self-pay | Admitting: Hematology and Oncology

## 2017-03-17 NOTE — Telephone Encounter (Signed)
Rescheduled no show from 12/11 - spoke with patient regarding appts added

## 2017-03-18 ENCOUNTER — Telehealth: Payer: Self-pay

## 2017-03-18 NOTE — Telephone Encounter (Signed)
Returned call and left VM for Randall Hiss with [facility] regarding verbal orders to approved PT continuance for Mrs. Alroy Dust. Will call with any other questions.  Cyndia Bent RN

## 2017-03-26 NOTE — Assessment & Plan Note (Deleted)
Metastatic breast cancer with brain metastases: Cerebellar lesion 2.9 cm in size status post gross total resection and Dr. [name] on 11/21/2016  (Right breast invasive ductal carcinoma multifocal disease ER/PR positive HER-2 negative T2, N1, M0 stage IIB status post mastectomy, adjuvant chemotherapy and radiation now on antiestrogen therapy with tamoxifen since October 2014.)  Because of the brain metastases, we switched her to letrozole therapy.  Letrozole toxicities: Patient denies any hot flashes or myalgias. Our plan is to obtain CT chest abdomen pelvis in February and follow-up after that.  Decreased appetite: I resumed dexamethasone half a tablet a day. Patient is seeing radiation oncology to determine the frequency of brain MRIs.  Scans: 03/29/17  Return to clinic in 3 months for follow-up

## 2017-03-27 ENCOUNTER — Other Ambulatory Visit: Payer: Self-pay | Admitting: Radiation Therapy

## 2017-03-27 ENCOUNTER — Ambulatory Visit: Payer: BLUE CROSS/BLUE SHIELD | Admitting: Hematology and Oncology

## 2017-03-27 DIAGNOSIS — C7949 Secondary malignant neoplasm of other parts of nervous system: Secondary | ICD-10-CM

## 2017-03-27 DIAGNOSIS — C7931 Secondary malignant neoplasm of brain: Secondary | ICD-10-CM

## 2017-03-27 NOTE — Progress Notes (Deleted)
Patient Care Team: Golden Circle, FNP as PCP - General (Family Medicine)  DIAGNOSIS:  Encounter Diagnosis  Name Primary?  . Primary cancer of upper outer quadrant of right female breast ([place])     SUMMARY OF ONCOLOGIC HISTORY:   Primary cancer of upper outer quadrant of right female breast ([place])   04/16/2012 Initial Biopsy    Right breast mass biopsy invasive ductal carcinoma with abundant mucin in all the 3 masses, right axillary lymph node biopsy positive for IDC, ER 100% PR percent Ki-67 19%, HER-2 negative ratio 0.94      04/21/2012 Breast MRI    Right breast 3 masses 11o'clock position 1.8 cm, now 11:00 position 2 cm, and 10:00 position 2.3 cm with an abnormal level I right axillary lymph node 3 cm      05/20/2012 Surgery    Right breast mastectomy invasive ductal carcinoma grade 2; 1.9 cm, 2.5 cm, 1.8 cm, 1/17 LN positive, second and third massive grade 3      06/24/2012 - 10/07/2012 Chemotherapy    Adjuvant chemotherapy with Taxotere Cytoxan x6 cycles      10/16/2012 - 12/16/2012 Radiation Therapy    Adjuvant radiation therapy by Dr. [name]      01/07/2013 -  Anti-estrogen oral therapy    Tamoxifen 20 mg daily      11/19/2016 Imaging    MRI brain: 2.9 similar mass within the inferior vermis with surrounding edema and local mass effect partially effacing the fourth ventricle.       11/21/2016 Relapse/Recurrence    Brain biopsy: Metastatic ductal carcinoma the breast positive for ER PR, GATA 3, negative for PAX8 and TTF-1      11/21/2016 Surgery    Suboccipital craniotomy for gross total resection of cerebellar tumor by Dr. [name]       CHIEF COMPLIANT: Follow-up of metastatic breast cancer to the brain  INTERVAL HISTORY: Tasha Ortiz is a 61 year old with prior history of breast cancer who presented while being on tamoxifen with a brain metastases.  She underwent suboccipital craniotomy for gross total resection of the cerebellar tumor followed by  adjuvant radiation to the brain.  We switched her from tamoxifen to letrozole.  CT scans are scheduled for 03/29/2017.  REVIEW OF SYSTEMS:   Constitutional: Denies fevers, chills or abnormal weight loss Eyes: Denies blurriness of vision Ears, nose, mouth, throat, and face: Denies mucositis or sore throat Respiratory: Denies cough, dyspnea or wheezes Cardiovascular: Denies palpitation, chest discomfort Gastrointestinal:  Denies nausea, heartburn or change in bowel habits Skin: Denies abnormal skin rashes Lymphatics: Denies new lymphadenopathy or easy bruising Neurological:Denies numbness, tingling or new weaknesses Behavioral/Psych: Mood is stable, no new changes  Extremities: No lower extremity edema Breast:  denies any pain or lumps or nodules in either breasts All other systems were reviewed with the patient and are negative.  I have reviewed the past medical history, past surgical history, social history and family history with the patient and they are unchanged from previous note.  ALLERGIES:  is allergic to lisinopril.  MEDICATIONS:  Current Outpatient Medications  Medication Sig Dispense Refill  . acetaminophen (TYLENOL) 325 MG tablet Take 650 mg by mouth every 6 (six) hours as needed for moderate pain.    Marland Kitchen amLODipine (NORVASC) 5 MG tablet TAKE 1 TABLET BY MOUTH EVERY DAY 90 tablet 1  . anastrozole (ARIMIDEX) 1 MG tablet Take 1 tablet (1 mg total) by mouth daily. (Patient not taking: Reported on 02/18/2017) 90 tablet 3  .  fluticasone (FLONASE) 50 MCG/ACT nasal spray Place 2 sprays into both nostrils daily. (Patient not taking: Reported on 02/18/2017) 16 g 3  . meclizine (ANTIVERT) 12.5 MG tablet Take 1 tablet (12.5 mg total) 3 (three) times daily as needed by mouth for dizziness. 30 tablet 1  . pravastatin (PRAVACHOL) 40 MG tablet Take 1 tablet (40 mg total) daily by mouth. Annual appt is due w/labs must see provider for future refills 30 tablet 0   No current  facility-administered medications for this visit.     PHYSICAL EXAMINATION: ECOG PERFORMANCE STATUS: 1 - Symptomatic but completely ambulatory  There were no vitals filed for this visit. There were no vitals filed for this visit.  GENERAL:alert, no distress and comfortable SKIN: skin color, texture, turgor are normal, no rashes or significant lesions EYES: normal, Conjunctiva are pink and non-injected, sclera clear OROPHARYNX:no exudate, no erythema and lips, buccal mucosa, and tongue normal  NECK: supple, thyroid normal size, non-tender, without nodularity LYMPH:  no palpable lymphadenopathy in the cervical, axillary or inguinal LUNGS: clear to auscultation and percussion with normal breathing effort HEART: regular rate & rhythm and no murmurs and no lower extremity edema ABDOMEN:abdomen soft, non-tender and normal bowel sounds MUSCULOSKELETAL:no cyanosis of digits and no clubbing  NEURO: alert & oriented x 3 with fluent speech, no focal motor/sensory deficits EXTREMITIES: No lower extremity edema  LABORATORY DATA:  I have reviewed the data as listed   Chemistry      Component Value Date/Time   NA 137 12/31/2016 1100   NA 143 07/05/2014 1101   K 3.7 12/31/2016 1100   K 3.7 07/05/2014 1101   CL 101 12/31/2016 1100   CL 103 09/23/2012 1040   CO2 28 12/31/2016 1100   CO2 27 07/05/2014 1101   BUN 16 12/31/2016 1100   BUN 11.8 07/05/2014 1101   CREATININE 0.70 12/31/2016 1100   CREATININE 0.8 07/05/2014 1101      Component Value Date/Time   CALCIUM 9.1 12/31/2016 1100   CALCIUM 9.4 07/05/2014 1101   ALKPHOS 38 12/31/2016 1100   ALKPHOS 45 07/05/2014 1101   AST 18 12/31/2016 1100   AST 18 07/05/2014 1101   ALT 17 12/31/2016 1100   ALT 15 07/05/2014 1101   BILITOT 0.6 12/31/2016 1100   BILITOT 0.21 07/05/2014 1101       Lab Results  Component Value Date   WBC 5.6 12/31/2016   HGB 12.7 12/31/2016   HCT 37.4 12/31/2016   MCV 86.0 12/31/2016   PLT 120 (L)  12/31/2016   NEUTROABS 4.6 12/31/2016    ASSESSMENT & PLAN:  Primary cancer of upper outer quadrant of right female breast ([place]) Metastatic breast cancer with brain metastases: Cerebellar lesion 2.9 cm in size status post gross total resection and Dr. [name] on 11/21/2016  (Right breast invasive ductal carcinoma multifocal disease ER/PR positive HER-2 negative T2, N1, M0 stage IIB status post mastectomy, adjuvant chemotherapy and radiation now on antiestrogen therapy with tamoxifen since October 2014.)  Because of the brain metastases, we switched her to letrozole therapy.  Letrozole toxicities: Patient denies any hot flashes or myalgias. Our plan is to obtain CT chest abdomen pelvis in February and follow-up after that.  Decreased appetite: I resumed dexamethasone half a tablet a day. Patient is seeing radiation oncology to determine the frequency of brain MRIs.  Scans: 03/29/17  Return to clinic in 3 months for follow-up    I spent 25 minutes talking to the patient of which more than  half was spent in counseling and coordination of care.  No orders of the defined types were placed in this encounter.  The patient has a good understanding of the overall plan. she agrees with it. she will call with any problems that may develop before the next visit here.   Harriette Ohara, MD 03/27/17

## 2017-03-29 ENCOUNTER — Ambulatory Visit (HOSPITAL_COMMUNITY): Payer: BLUE CROSS/BLUE SHIELD

## 2017-03-29 ENCOUNTER — Other Ambulatory Visit: Payer: Self-pay

## 2017-03-29 ENCOUNTER — Telehealth: Payer: Self-pay

## 2017-03-29 MED ORDER — ONDANSETRON HCL 8 MG PO TABS: 8.0000 mg | ORAL_TABLET | Freq: Three times a day (TID) | ORAL | 0 refills | Status: DC | PRN

## 2017-03-29 NOTE — Telephone Encounter (Signed)
Dr. Lindi Adie wanted pt to come into symptom management. Pt unable to come today. Sent scheduling message for pt to see Lucianne Lei, [place] on Monday prior to CT at 1 pm. Pt aware of appointment. We will call in Zofran for pt to get through the weekend.

## 2017-03-29 NOTE — Telephone Encounter (Signed)
Attempted to call pt, no answer. Called pt daughter, Pollyann Glen. God more information on pt concerns she called into Rad Onc. Pt has had continued nausea since she has completed radiation. She vomits when trying to eat solid food. Only able to tolerate a soft diet right now. Pt is scheduled for a CT abd/pelvis on 12/31. Pt will have to drink contrast and is concerned with this with her nausea. Will give her concerns to Dr. Lindi Adie and will call pt or [name] back with more information.  Cyndia Bent RN

## 2017-04-01 ENCOUNTER — Other Ambulatory Visit: Payer: Self-pay | Admitting: Hematology and Oncology

## 2017-04-01 ENCOUNTER — Ambulatory Visit: Payer: BLUE CROSS/BLUE SHIELD | Admitting: Adult Health

## 2017-04-01 ENCOUNTER — Ambulatory Visit (HOSPITAL_COMMUNITY)
Admission: RE | Admit: 2017-04-01 | Discharge: 2017-04-01 | Disposition: A | Discharge status: Home / Self Care | Payer: BLUE CROSS/BLUE SHIELD | Source: Ambulatory Visit | Attending: Hematology and Oncology | Admitting: Hematology and Oncology

## 2017-04-01 ENCOUNTER — Telehealth: Payer: Self-pay | Admitting: Hematology and Oncology

## 2017-04-01 DIAGNOSIS — I2699 Other pulmonary embolism without acute cor pulmonale: Secondary | ICD-10-CM | POA: Insufficient documentation

## 2017-04-01 DIAGNOSIS — C50411 Malignant neoplasm of upper-outer quadrant of right female breast: Secondary | ICD-10-CM

## 2017-04-01 MED ORDER — IOPAMIDOL (ISOVUE-300) INJECTION 61%
100.0000 mL | Freq: Once | INTRAVENOUS | Status: AC | PRN
  Administered 2017-04-01: 100 mL via INTRAVENOUS

## 2017-04-01 MED ORDER — RIVAROXABAN (XARELTO) VTE STARTER PACK (15 & 20 MG): ORAL_TABLET | ORAL | 0 refills | Status: DC

## 2017-04-01 MED ORDER — PRAVASTATIN SODIUM 40 MG PO TABS: 40.0000 mg | ORAL_TABLET | Freq: Every day | ORAL | 0 refills | Status: DC

## 2017-04-01 MED ORDER — IOPAMIDOL (ISOVUE-300) INJECTION 61%
INTRAVENOUS | Status: AC
  Filled 2017-04-01: qty 100

## 2017-04-01 NOTE — Telephone Encounter (Signed)
Called the patient's daughter and left message that Tasha Ortiz has Bilateral PEs. If shes stable,we can treat her as an outpatient with Xarelto. If shes having symptoms, she will need to come to ED right away. Sent prescription for Xarelto to her pharmacy just incase Will await a call back

## 2017-04-08 ENCOUNTER — Telehealth: Payer: Self-pay

## 2017-04-08 NOTE — Telephone Encounter (Signed)
Received call from Brittany,RN (home health) to request extension on her home health OT for another 5 weeks to work on balance and gait. Per Dr.Gudena, okay to continue extension. No further needs at this time.

## 2017-04-09 ENCOUNTER — Ambulatory Visit (HOSPITAL_COMMUNITY)
Admission: RE | Admit: 2017-04-09 | Discharge: 2017-04-09 | Disposition: A | Discharge status: Home / Self Care | Payer: BLUE CROSS/BLUE SHIELD | Source: Ambulatory Visit | Attending: Radiation Oncology | Admitting: Radiation Oncology

## 2017-04-09 DIAGNOSIS — C7931 Secondary malignant neoplasm of brain: Secondary | ICD-10-CM | POA: Insufficient documentation

## 2017-04-09 DIAGNOSIS — C7949 Secondary malignant neoplasm of other parts of nervous system: Secondary | ICD-10-CM | POA: Insufficient documentation

## 2017-04-09 LAB — CREATININE, SERUM
Creatinine, Ser: 0.7 mg/dL (ref 0.44–1.00)
GFR calc Af Amer: >60 mL/min (ref >60)
GFR calc non Af Amer: >60 mL/min (ref >60)

## 2017-04-09 MED ORDER — GADOBENATE DIMEGLUMINE 529 MG/ML IV SOLN
15.0000 mL | Freq: Once | INTRAVENOUS | Status: AC
  Administered 2017-04-09: 14 mL via INTRAVENOUS

## 2017-04-11 ENCOUNTER — Ambulatory Visit (HOSPITAL_COMMUNITY): Payer: BLUE CROSS/BLUE SHIELD

## 2017-04-12 ENCOUNTER — Telehealth: Payer: Self-pay

## 2017-04-12 NOTE — Telephone Encounter (Signed)
Received called from [facility] home [facility] for verbal order for continued physical therapy 2x per week for 8 more weeks to increase gait and stability. Pt lives on the 2nd floor of her apartment building and will need to work on going up/down the stairs. Gave permission per Dr.Gudena for continued pt. No further needs at this time.

## 2017-04-16 ENCOUNTER — Encounter: Payer: Self-pay | Admitting: *Deleted

## 2017-04-16 NOTE — Progress Notes (Signed)
[place] with daughter Carline Dura about appointment from [id number]  to 04-17-17 at 1300 asked to be here at 24 to check in and then come to Radiation oncology.

## 2017-04-17 ENCOUNTER — Ambulatory Visit
Admission: RE | Admit: 2017-04-17 | Discharge: 2017-04-17 | Disposition: A | Discharge status: Home / Self Care | Payer: BLUE CROSS/BLUE SHIELD | Source: Ambulatory Visit | Attending: Radiation Oncology | Admitting: Radiation Oncology

## 2017-04-17 ENCOUNTER — Other Ambulatory Visit: Payer: Self-pay

## 2017-04-17 ENCOUNTER — Telehealth: Payer: Self-pay

## 2017-04-17 VITALS — BP 157/99 | HR 86 | Temp 97.9°F | Resp 18 | Ht 64.0 in | Wt 138.0 lb

## 2017-04-17 DIAGNOSIS — Z9011 Acquired absence of right breast and nipple: Secondary | ICD-10-CM | POA: Insufficient documentation

## 2017-04-17 DIAGNOSIS — C50411 Malignant neoplasm of upper-outer quadrant of right female breast: Secondary | ICD-10-CM | POA: Insufficient documentation

## 2017-04-17 DIAGNOSIS — Z9889 Other specified postprocedural states: Secondary | ICD-10-CM | POA: Insufficient documentation

## 2017-04-17 DIAGNOSIS — C7931 Secondary malignant neoplasm of brain: Secondary | ICD-10-CM | POA: Diagnosis not present

## 2017-04-17 DIAGNOSIS — Z79899 Other long term (current) drug therapy: Secondary | ICD-10-CM | POA: Insufficient documentation

## 2017-04-17 DIAGNOSIS — Z86711 Personal history of pulmonary embolism: Secondary | ICD-10-CM | POA: Insufficient documentation

## 2017-04-17 DIAGNOSIS — C50919 Malignant neoplasm of unspecified site of unspecified female breast: Secondary | ICD-10-CM

## 2017-04-17 NOTE — Progress Notes (Signed)
Radiation Oncology         [phone] ________________________________  Name: Tasha Ortiz MRN: [id number]  Date of Service: 04/17/2017 DOB: 03-27-1956  Post Treatment Note  CC: Golden Circle, FNP  Nicholas Lose, MD  Diagnosis:   Metastatic breast cancer to brain.   Interval Since Last Radiation: 6 weeks   12/25/2016 - 01/09/2017: Posterior fossa / 30 Gy in 10 fractions  10/02/12-12/06/12:  60.4 Gy to the right chest wall and regional nodes  Narrative:  The patient returns today for routine follow-up. In summary this patient has a history of metastatic breast cancer.  She was originally diagnosed with stage IIB, pT2bN1a, ER PR positive invasive ductal carcinoma of the right breast, she underwent mastectomy and adjuvant chemo with radiation and completed her treatment in 2014.  She developed headaches and was seen in August after an episode of the symptoms revealing a 29 x 28 x 27 mm mass in the left hemisphere of the cerebellum.  She underwent surgical resection on 11/21/2016, and final pathology was consistent with her history of breast cancer.  She went on to have postoperative treatment to the posterior fossa which she completed in October 2018.  She comes today for follow-up.  She has remained on Keppra, and was tapered off of Decadron.  She comes today for her post treatment follow-up.  On review of systems, the patient states she is feeling well. She notes occasional neck pain but reports her surgical site is doing well and she's not having any headaches. She has some blurry vision but reports this is intermittent. She is not having visual acuity concerns as a result. She is taking dexamethasone per Dr. Lindi Adie as an appetite stimulant. No other complaints are noted.  ALLERGIES:  is allergic to lisinopril.  Meds: Current Outpatient Medications  Medication Sig Dispense Refill  . acetaminophen (TYLENOL) 325 MG tablet Take 650 mg by mouth every 6 (six) hours as needed for  moderate pain.    Marland Kitchen anastrozole (ARIMIDEX) 1 MG tablet Take 1 tablet (1 mg total) by mouth daily. 90 tablet 3  . meclizine (ANTIVERT) 12.5 MG tablet Take 1 tablet (12.5 mg total) 3 (three) times daily as needed by mouth for dizziness. 30 tablet 1  . ondansetron (ZOFRAN) 8 MG tablet Take 1 tablet (8 mg total) by mouth every 8 (eight) hours as needed for nausea or vomiting. 20 tablet 0  . pravastatin (PRAVACHOL) 40 MG tablet Take 1 tablet (40 mg total) by mouth daily. Annual appt is due w/labs must see provider for future refills 30 tablet 0  . Rivaroxaban 15 & 20 MG TBPK Start one 15mg  tablet by mouth twice a day with food. On Day 22, switch to one 20mg  tablet once a day with food. 51 each 0  . fluticasone (FLONASE) 50 MCG/ACT nasal spray Place 2 sprays into both nostrils daily. (Patient not taking: Reported on 02/18/2017) 16 g 3   No current facility-administered medications for this encounter.     Physical Findings:  height is 5\' 4"  (1.626 m) and weight is 138 lb (62.6 kg). Her oral temperature is 97.9 F (36.6 C). Her blood pressure is 157/99 (abnormal) and her pulse is 86. Her respiration is 18 and oxygen saturation is 100%.  Pain Assessment Pain Score: 0-No pain/10 In general this is a well appearing African American female in no acute distress. She's alert and oriented x4 and appropriate throughout the examination. Cardiopulmonary assessment is negative for acute distress and she exhibits  normal effort. Her posterior craniotomy site is intact with minimal fullness consistent with meningocele.   Lab Findings: Lab Results  Component Value Date   WBC 5.6 12/31/2016   HGB 12.7 12/31/2016   HCT 37.4 12/31/2016   MCV 86.0 12/31/2016   PLT 120 (L) 12/31/2016     Radiographic Findings: Ct Chest W Contrast  Result Date: 04/01/2017 CLINICAL DATA:  Followup right upper outer quadrant breast carcinoma with brain metastases. EXAM: CT CHEST, ABDOMEN, AND PELVIS WITH CONTRAST TECHNIQUE:  Multidetector CT imaging of the chest, abdomen and pelvis was performed following the standard protocol during bolus administration of intravenous contrast. CONTRAST:  163mL ISOVUE-300 IOPAMIDOL (ISOVUE-300) INJECTION 61% COMPARISON:  11/19/2016 FINDINGS: CT CHEST FINDINGS Cardiovascular: Pulmonary embolism is seen within the central pulmonary arteries, right side greater than left, new since previous study. No CT evidence of right heart strain. Mediastinum/Lymph Nodes: No masses or pathologically enlarged lymph nodes identified. Previous right mastectomy and axillary lymph node dissection, without evidence of axillary lymphadenopathy. Lungs/Pleura: New mild subsegmental atelectasis in the lingula and left lower lobe. Stable scattered tiny sub-cm right lung nodules, most likely postinflammatory in etiology. No new or enlarging pulmonary nodules or masses identified. No evidence of pulmonary airspace disease or pleural effusion. Musculoskeletal:  No suspicious bone lesions identified. CT ABDOMEN AND PELVIS FINDINGS Hepatobiliary: No masses identified. Gallbladder is unremarkable. Pancreas:  No mass or inflammatory changes. Spleen:  Within normal limits in size and appearance. Adrenals/Urinary tract:  No masses or hydronephrosis. Stomach/Bowel: No evidence of obstruction, inflammatory process, or abnormal fluid collections. Vascular/Lymphatic: No pathologically enlarged lymph nodes identified. No abdominal aortic aneurysm. Aortic atherosclerosis. Reproductive:  No mass or other significant abnormality identified. Other:  None. Musculoskeletal:  No suspicious bone lesions identified. IMPRESSION: No evidence of recurrent or metastatic carcinoma. Acute bilateral pulmonary embolism. Critical Value/emergent results were called by telephone at the time of interpretation on 04/01/2017 at 6:45 pm to Dr. Nicholas Lose , who verbally acknowledged these results. Electronically Signed   By: [name] M.D.   On: 04/01/2017 18:49     Mr Jeri Cos SJ Contrast  Result Date: 04/09/2017 CLINICAL DATA:  Follow-up treated metastatic disease. EXAM: MRI HEAD WITHOUT AND WITH CONTRAST TECHNIQUE: Multiplanar, multiecho pulse sequences of the brain and surrounding structures were obtained without and with intravenous contrast. CONTRAST:  3mL MULTIHANCE GADOBENATE DIMEGLUMINE 529 MG/ML IV SOLN COMPARISON:  12/17/2016 FINDINGS: Brain: Status post suboccipital resection of a breast cancer metastasis. The operative cavity has collapsed since prior. Dense enhancement around the cavity that has collapsed with some what lobulated/nodular features that could reflect tumor or treatment change. This area shows hemosiderin staining, hopefully this is evolving scar. There is enhancement along the folia of the bilateral inferior cerebellum that maintains the cerebellar architecture and is likely post treatment, although infarct related enhancement should have resolved by this time. This enhancement is not particularly nodular to suggest leptomeningeal disease. T2 hyperintensity in the posterior fossa is without mass effect to indicate edema. No discrete masslike area for measurement purposes. On sagittal acquisition the postoperative inferior cerebellum is closely applied to the dura, potentially tethered given the fourth ventricle shape. A suboccipital fluid collection has a more full and rounded appearance than prior but is similar in size at 5 x 2 x 2 4 cm. No evidence of interval metastatic disease to the brain. No acute infarct, hemorrhage, or hydrocephalus. There is a small volume of fat that is mobile within the left lateral ventricle. Chronic patchy small FLAIR hyperintensities  in the cerebral white matter. Vascular: Major flow voids and vascular enhancements are preserved. Skull and upper cervical spine: Negative for marrow lesion. Sinuses/Orbits: Negative IMPRESSION: 1. Suboccipital breast cancer resection with posterior fossa radiation. Nonspecific  enhancement at the margins of the surgical cavity which has a new appearance due to interval collapse, as above. 2. Adjacent cerebellar folia enhancement which is decreased and considered postoperative. 3. No evidence of interval metastasis. 4. Similar size but more rounded and full appearing suboccipital fluid collection. 5. Small volume mobile fat within the left lateral ventricle. Electronically Signed   By: Monte Fantasia M.D.   On: 04/09/2017 16:38   Ct Abdomen Pelvis W Contrast  Result Date: 04/01/2017 CLINICAL DATA:  Followup right upper outer quadrant breast carcinoma with brain metastases. EXAM: CT CHEST, ABDOMEN, AND PELVIS WITH CONTRAST TECHNIQUE: Multidetector CT imaging of the chest, abdomen and pelvis was performed following the standard protocol during bolus administration of intravenous contrast. CONTRAST:  171mL ISOVUE-300 IOPAMIDOL (ISOVUE-300) INJECTION 61% COMPARISON:  11/19/2016 FINDINGS: CT CHEST FINDINGS Cardiovascular: Pulmonary embolism is seen within the central pulmonary arteries, right side greater than left, new since previous study. No CT evidence of right heart strain. Mediastinum/Lymph Nodes: No masses or pathologically enlarged lymph nodes identified. Previous right mastectomy and axillary lymph node dissection, without evidence of axillary lymphadenopathy. Lungs/Pleura: New mild subsegmental atelectasis in the lingula and left lower lobe. Stable scattered tiny sub-cm right lung nodules, most likely postinflammatory in etiology. No new or enlarging pulmonary nodules or masses identified. No evidence of pulmonary airspace disease or pleural effusion. Musculoskeletal:  No suspicious bone lesions identified. CT ABDOMEN AND PELVIS FINDINGS Hepatobiliary: No masses identified. Gallbladder is unremarkable. Pancreas:  No mass or inflammatory changes. Spleen:  Within normal limits in size and appearance. Adrenals/Urinary tract:  No masses or hydronephrosis. Stomach/Bowel: No evidence  of obstruction, inflammatory process, or abnormal fluid collections. Vascular/Lymphatic: No pathologically enlarged lymph nodes identified. No abdominal aortic aneurysm. Aortic atherosclerosis. Reproductive:  No mass or other significant abnormality identified. Other:  None. Musculoskeletal:  No suspicious bone lesions identified. IMPRESSION: No evidence of recurrent or metastatic carcinoma. Acute bilateral pulmonary embolism. Critical Value/emergent results were called by telephone at the time of interpretation on 04/01/2017 at 6:45 pm to Dr. Nicholas Lose , who verbally acknowledged these results. Electronically Signed   By: [name] M.D.   On: 04/01/2017 18:49    Impression/Plan: 1. Metastatic breast cancer to brain.  The patient has recovered well from the effects of radiotherapy and appears to be doing well overall.  Recent systemic imaging did not show any evidence of recurrent or metastatic disease in the chest, abdomen or pelvis but did incidentally detect bilateral pulmonary emboli.  She was recently switched to Arimedex due to recent bilateral PEs which are being managed by Dr. Lindi Adie. She will continue in routine follow up with Dr. Lindi Adie for disease management.  We discussed the rationale for posttreatment surveillance in the brain oncology clinic.  We will continue to monitor her brain disease for recurrence or progression with follow up brain MRIs q 3 months and will present her case in multidisciplinary conference prior to her follow up visit in our office to discuss the results and recommendations.  She appears to have a good understanding of this plan and is in agreement.  She knows to call with any questions or concerns in the interim. 2. Dizziness/Imbalance.  This has been present since the time of surgery but seems to be more noticeable since she  has started working with PT.  It is not persistent and seems more pronounced with activities and particularly when changing positions.  She has  a known suboccipital fluid collection which appears stable on recent MRI imaging.  There is a more full and rounded appearance than prior but is similar in size at 5 x 2 x 2 4 cm.  We will continue to monitor this on follow up imaging and she is advised to monitor sxs and inform us if symptoms progress or become more persistent.  She will continue working with PT/OT for strengthening and balance.  We will refer her to see Dr. Mickeal Skinner for further evaluation and recommendation of imbalance issues going forward.     Nicholos Johns, PA-C

## 2017-04-17 NOTE — Telephone Encounter (Signed)
Left VM with Erick PT with [facility]. Verbal orders given to continue [facility] PT. Fax number and direct line given.  Cyndia Bent RN

## 2017-04-17 NOTE — Progress Notes (Addendum)
Tasha Ortiz 62 y. o. woman with Secondary malignant neoplasm of brain completed radiation 01-09-17 review 04-09-17 MRI brain w wo contrast, FU.  Headache:No Pain:No Dizziness:Reports having dizziness daily for more than a month last all day some days it helps to put on her glasses sometimes. Nausea/vomiting:Yes reports nausea and vomiting none the past two to three weeks since starting the zofran. Ringing in earsNo: Visual changes (Blurred/ diplopia double vision,blind spots, and peripheral vsion changes):Having blurred vision at times while looking at TV. Fatigue:No  Only while doing PT. Started Rivaroxaban 15 mg for lung clot for 22days bid then 20 mg daily with food. Cognitive changes:  Alert and oriented x 3 with fluent speech,able to complete sentences without difficulty with word finding or organization of sentences. Forgetful at times feels it has improved. Wt Readings from Last 3 Encounters:  04/17/17 138 lb (62.6 kg)  02/18/17 142 lb (64.4 kg)  02/12/17 142 lb 4.8 oz (64.5 kg)  BP (!) 157/99 (BP Location: Left Arm, Patient Position: Sitting, Cuff Size: Normal)   Pulse 86   Temp 97.9 F (36.6 C) (Oral)   Resp 18   Ht 5\' 4"  (1.626 m)   Wt 138 lb (62.6 kg)   LMP 08/16/2012   SpO2 100%   BMI 23.69 kg/m

## 2017-04-17 NOTE — Telephone Encounter (Signed)
Disability paperwork put in [facility] folder for pick up.  Cyndia Bent RN

## 2017-04-18 ENCOUNTER — Inpatient Hospital Stay
Admission: RE | Admit: 2017-04-18 | Discharge: 2017-04-18 | Disposition: A | Discharge status: Home / Self Care | Payer: Self-pay | Source: Ambulatory Visit | Attending: Urology | Admitting: Urology

## 2017-04-19 ENCOUNTER — Telehealth: Payer: Self-pay | Admitting: *Deleted

## 2017-04-19 NOTE — Telephone Encounter (Signed)
Received referral from Radiation Oncology to see patient for Dizziness/Balance issues due to Brain mets to see Dr. Mickeal Skinner..  Called to schedule, lm for returned call.

## 2017-04-22 ENCOUNTER — Telehealth: Payer: Self-pay

## 2017-04-22 NOTE — Telephone Encounter (Signed)
TC call to pt's daughter Tasha Ortiz regarding her question as to whether patient can restart her Amlodipine as previously ordered.  She reports patient's blood pressure was low at the later part of the year when she saw [name] last and he stopped it.  Per daughter, now due to pt is eating better and her blood pressure is starting to increase and wants to resume Amlodipine. Spoke with [name] at this time and per him, pt can restart her Amlodipine as previously prescribed. No further needs at this time.

## 2017-04-30 ENCOUNTER — Other Ambulatory Visit: Payer: Self-pay

## 2017-05-01 ENCOUNTER — Telehealth: Payer: Self-pay | Admitting: Genetics

## 2017-05-01 NOTE — Telephone Encounter (Signed)
Spoke with patients dtr regarding appointment for GEN per scheduling msg 1/30

## 2017-05-02 ENCOUNTER — Other Ambulatory Visit: Payer: Self-pay | Admitting: Hematology and Oncology

## 2017-05-10 ENCOUNTER — Other Ambulatory Visit: Payer: Self-pay

## 2017-05-10 ENCOUNTER — Ambulatory Visit: Payer: BLUE CROSS/BLUE SHIELD | Admitting: Family

## 2017-05-10 MED ORDER — PRAVASTATIN SODIUM 40 MG PO TABS: 40.0000 mg | ORAL_TABLET | Freq: Every day | ORAL | 0 refills | Status: DC

## 2017-05-13 ENCOUNTER — Ambulatory Visit: Payer: BLUE CROSS/BLUE SHIELD | Admitting: Family

## 2017-05-13 ENCOUNTER — Encounter: Payer: Self-pay | Admitting: Family

## 2017-05-13 ENCOUNTER — Inpatient Hospital Stay: Payer: BLUE CROSS/BLUE SHIELD | Attending: Internal Medicine | Admitting: Internal Medicine

## 2017-05-13 ENCOUNTER — Encounter: Payer: Self-pay | Admitting: Internal Medicine

## 2017-05-13 VITALS — BP 144/88 | HR 86 | Temp 97.4°F | Resp 18 | Ht 64.0 in | Wt 145.1 lb

## 2017-05-13 VITALS — BP 108/82 | HR 81 | Temp 97.9°F | Ht 64.0 in | Wt 145.0 lb

## 2017-05-13 DIAGNOSIS — I10 Essential (primary) hypertension: Secondary | ICD-10-CM

## 2017-05-13 DIAGNOSIS — Z79810 Long term (current) use of selective estrogen receptor modulators (SERMs): Secondary | ICD-10-CM | POA: Diagnosis not present

## 2017-05-13 DIAGNOSIS — C773 Secondary and unspecified malignant neoplasm of axilla and upper limb lymph nodes: Secondary | ICD-10-CM

## 2017-05-13 DIAGNOSIS — G959 Disease of spinal cord, unspecified: Secondary | ICD-10-CM

## 2017-05-13 DIAGNOSIS — K219 Gastro-esophageal reflux disease without esophagitis: Secondary | ICD-10-CM | POA: Diagnosis not present

## 2017-05-13 DIAGNOSIS — C50411 Malignant neoplasm of upper-outer quadrant of right female breast: Secondary | ICD-10-CM

## 2017-05-13 DIAGNOSIS — Z79811 Long term (current) use of aromatase inhibitors: Secondary | ICD-10-CM | POA: Diagnosis not present

## 2017-05-13 DIAGNOSIS — Z803 Family history of malignant neoplasm of breast: Secondary | ICD-10-CM | POA: Diagnosis not present

## 2017-05-13 DIAGNOSIS — C7931 Secondary malignant neoplasm of brain: Secondary | ICD-10-CM | POA: Diagnosis not present

## 2017-05-13 DIAGNOSIS — E7800 Pure hypercholesterolemia, unspecified: Secondary | ICD-10-CM

## 2017-05-13 DIAGNOSIS — R270 Ataxia, unspecified: Secondary | ICD-10-CM | POA: Diagnosis not present

## 2017-05-13 DIAGNOSIS — Z923 Personal history of irradiation: Secondary | ICD-10-CM

## 2017-05-13 DIAGNOSIS — Z79899 Other long term (current) drug therapy: Secondary | ICD-10-CM

## 2017-05-13 DIAGNOSIS — Z808 Family history of malignant neoplasm of other organs or systems: Secondary | ICD-10-CM | POA: Diagnosis not present

## 2017-05-13 DIAGNOSIS — Z9011 Acquired absence of right breast and nipple: Secondary | ICD-10-CM | POA: Diagnosis not present

## 2017-05-13 DIAGNOSIS — R29818 Other symptoms and signs involving the nervous system: Secondary | ICD-10-CM | POA: Diagnosis not present

## 2017-05-13 DIAGNOSIS — Z9221 Personal history of antineoplastic chemotherapy: Secondary | ICD-10-CM

## 2017-05-13 DIAGNOSIS — E782 Mixed hyperlipidemia: Secondary | ICD-10-CM

## 2017-05-13 MED ORDER — PRAVASTATIN SODIUM 40 MG PO TABS: 40.0000 mg | ORAL_TABLET | Freq: Every day | ORAL | 1 refills | Status: DC

## 2017-05-13 MED ORDER — DEXAMETHASONE 1 MG PO TABS: 1.0000 mg | ORAL_TABLET | Freq: Every day | ORAL | 5 refills | Status: DC

## 2017-05-13 MED ORDER — MECLIZINE HCL 12.5 MG PO TABS: 12.5000 mg | ORAL_TABLET | Freq: Three times a day (TID) | ORAL | 1 refills | Status: AC | PRN

## 2017-05-13 NOTE — Progress Notes (Signed)
[facility], [place] [address] [phone]   New Patient Evaluation  Date of Service: 05/13/17 Patient Name: Tasha Ortiz Patient MRN: [id number] Patient DOB: Jan 25, 1956 Provider: Ventura Sellers, MD  Identifying Statement:  Tasha Ortiz is a 62 y.o. female with Brain metastasis ([place]) [C79.31] who presents for initial consultation and evaluation regarding cancer associated neurologic deficits.    Referring Provider: Golden Circle, FNP [address] [place], [place] [address]  Primary Cancer: Breast ER+/PR+/HER-  Oncologic History:   Primary cancer of upper outer quadrant of right female breast ([facility])   04/16/2012 Initial Biopsy    Right breast mass biopsy invasive ductal carcinoma with abundant mucin in all the 3 masses, right axillary lymph node biopsy positive for IDC, ER 100% PR percent Ki-67 19%, HER-2 negative ratio 0.94      04/21/2012 Breast MRI    Right breast 3 masses 11o'clock position 1.8 cm, now 11:00 position 2 cm, and 10:00 position 2.3 cm with an abnormal level I right axillary lymph node 3 cm      05/20/2012 Surgery    Right breast mastectomy invasive ductal carcinoma grade 2; 1.9 cm, 2.5 cm, 1.8 cm, 1/17 LN positive, second and third massive grade 3      06/24/2012 - 10/07/2012 Chemotherapy    Adjuvant chemotherapy with Taxotere Cytoxan x6 cycles      10/16/2012 - 12/16/2012 Radiation Therapy    Adjuvant radiation therapy by Dr. [name]      01/07/2013 -  Anti-estrogen oral therapy    Tamoxifen 20 mg daily      11/19/2016 Imaging    MRI brain: 2.9 similar mass within the inferior vermis with surrounding edema and local mass effect partially effacing the fourth ventricle.       11/21/2016 Relapse/Recurrence    Brain biopsy: Metastatic ductal carcinoma the breast positive for ER PR, GATA 3, negative for PAX8 and TTF-1      11/21/2016 Surgery    Suboccipital craniotomy for  gross total resection of cerebellar tumor by Dr. [name]       History of Present Illness: The patient's records from the referring physician were obtained and reviewed and the patient interviewed to confirm this HPI.  Tasha Ortiz presents today to review her CNS disease burden from metastatic breast cancer, and associated neurologic deficits.  She initially described feeling "off balance" this summer, which led to an MRI in August demonstrating large posterior fossa metastasis.  This was resected on 11/21/16, and followed by post-op SRS to the resection cavity.  She describes clinical stability in the post-operative period, which was followed by significant decline during immediately following radiation, described as "inability to ambulate, dress self, feed self" due to poor balance and coordination.  These deficits have reportedly improved dramatically since that time- she is still reliant on a walker, but all other ADL's she is able to perform independently.  Occasionally she has bouts of vertigo, which are effectively treated with Meclizine.   Most recent MRI in January was stable.  She does acknowledge some neck and back pain recently along midline.  No urinary incontinence.  Dr. Lindi Adie had prescribed 56m decadron daily for appetite stimulation, which she says has helped considerably and would like to continue.  Medications: Current Outpatient Medications on File Prior to Visit  Medication Sig Dispense Refill  . acetaminophen (TYLENOL) 325 MG tablet Take 650 mg by mouth every  6 (six) hours as needed for moderate pain.    Marland Kitchen anastrozole (ARIMIDEX) 1 MG tablet Take 1 tablet (1 mg total) by mouth daily. 90 tablet 3  . fluticasone (FLONASE) 50 MCG/ACT nasal spray Place 2 sprays into both nostrils daily. 16 g 3  . meclizine (ANTIVERT) 12.5 MG tablet Take 1 tablet (12.5 mg total) 3 (three) times daily as needed by mouth for dizziness. 30 tablet 1  . ondansetron (ZOFRAN) 8 MG tablet Take 1  tablet (8 mg total) by mouth every 8 (eight) hours as needed for nausea or vomiting. 20 tablet 0  . rivaroxaban (XARELTO) 20 MG TABS tablet Take 1 tablet (20 mg total) by mouth daily with supper. 30 tablet 1  . [DISCONTINUED] Prochlorperazine Maleate (COMPAZINE PO) Take by mouth.     No current facility-administered medications on file prior to visit.     Allergies:  Allergies  Allergen Reactions  . Lisinopril Swelling   Past Medical History:  Past Medical History:  Diagnosis Date  . Acid reflux   . Allergy    seasonal  . Breast cancer ([place])    right,no iv or blood on right sid, chemo and radiation 2014  . Heartburn   . Hypercholesteremia    taken off chol meds Dec 2012  . Hypertension   . Hypertension   . Personal history of chemotherapy 05/2012   rt breast  . Personal history of radiation therapy 10/2012   rt breast   Past Surgical History:  Past Surgical History:  Procedure Laterality Date  . BREAST BIOPSY Right 04/16/2012  . BREAST SURGERY Right 05/20/12   Rt br mastectomy  . CESAREAN SECTION  25 years ago  . colonoscopy    . COLONOSCOPY WITH PROPOFOL N/A 01/18/2015   Procedure: COLONOSCOPY WITH PROPOFOL;  Surgeon: Mauri Pole, MD;  Location: [facility];  Service: Endoscopy;  Laterality: N/A;  . MASTECTOMY Right 05/2012  . MODIFIED MASTECTOMY Right 05/20/2012   Procedure: MODIFIED MASTECTOMY;  Surgeon: Edward Jolly, MD;  Location: [facility];  Service: General;  Laterality: Right;  . PORTACATH PLACEMENT Left 05/20/2012   Procedure: INSERTION PORT-A-CATH;  Surgeon: Edward Jolly, MD;  Location: [facility];  Service: General;  Laterality: Left;  . SUBOCCIPITAL CRANIECTOMY CERVICAL LAMINECTOMY N/A 11/21/2016   Procedure: SUBOCCIPITAL CRANIECTOMY RESECTION TUMOR;  Surgeon: Newman Pies, MD;  Location: [facility];  Service: Neurosurgery;  Laterality: N/A;  . TUBAL LIGATION     Social History:  Social History   Socioeconomic History  . Marital status: Legally  Separated    Spouse name: Not on file  . Number of children: 2  . Years of education: 71  . Highest education level: Not on file  Social Needs  . Financial resource strain: Not on file  . Food insecurity - worry: Not on file  . Food insecurity - inability: Not on file  . Transportation needs - medical: Not on file  . Transportation needs - non-medical: Not on file  Occupational History  . Occupation: Pension scheme manager  Tobacco Use  . Smoking status: Never Smoker  . Smokeless tobacco: Former Systems developer    Types: Chew  Substance and Sexual Activity  . Alcohol use: No    Alcohol/week: 0.0 oz  . Drug use: No  . Sexual activity: No    Comment: menarche 89, P2, no HRT  Other Topics Concern  . Not on file  Social History Narrative   Fun: [employer]   Denies religious beliefs effecting health care.  Family History:  Family History  Problem Relation Age of Onset  . Hypertension Mother   . Cancer Sister        brain  . Cancer Cousin        breast  . Healthy Father   . Healthy Maternal Grandmother   . Healthy Maternal Grandfather   . Healthy Paternal Grandmother   . Healthy Paternal Grandfather   . Diabetes Daughter   . Colon cancer Neg Hx     Review of Systems: Constitutional: Denies fevers, chills or abnormal weight loss Eyes: Denies blurriness of vision Ears, nose, mouth, throat, and face: Denies mucositis or sore throat Respiratory: Denies cough, dyspnea or wheezes Cardiovascular: Denies palpitation, chest discomfort or lower extremity swelling Gastrointestinal:  Denies nausea, constipation, diarrhea GU: Denies dysuria or incontinence Skin: Denies abnormal skin rashes Neurological: Per HPI Musculoskeletal: midline back pain Behavioral/Psych: Denies anxiety, disturbance in thought content, and mood instability   Physical Exam: Vitals:   05/13/17 0912  BP: (!) 144/88  Pulse: 86  Resp: 18  Temp: (!) 97.4 F (36.3 C)  SpO2: 100%   KPS: 70. General: Alert,  cooperative, pleasant, in no acute distress.  In wheelchair Head: Craniotomy scar noted, dry and intact. EENT: No conjunctival injection or scleral icterus. Oral mucosa moist Lungs: Resp effort normal Cardiac: Regular rate and rhythm Abdomen: Soft, non-distended abdomen Skin: No rashes cyanosis or petechiae. Extremities: No clubbing or edema  Neurologic Exam: Mental Status: Awake, alert, attentive to examiner. Oriented to self and environment. Language is fluent with intact comprehension.  Cranial Nerves: Visual acuity is grossly normal. Visual fields are full. Extra-ocular movements intact. No ptosis. Face is symmetric, tongue midline. Motor: Tone and bulk are normal. Power is full in both arms and legs. Hyperreflexia noted in bilateral legs (3+) with sustained clonus in right > left ankle.  Arm reflexes normal. Dysmetria noted on right with finger to nose. Sensory: Intact to light touch and temperature Gait: Non-ambulatory   Labs: I have reviewed the data as listed    Component Value Date/Time   NA 137 12/31/2016 1100   NA 143 07/05/2014 1101   K 3.7 12/31/2016 1100   K 3.7 07/05/2014 1101   CL 101 12/31/2016 1100   CL 103 09/23/2012 1040   CO2 28 12/31/2016 1100   CO2 27 07/05/2014 1101   GLUCOSE 180 (H) 12/31/2016 1100   GLUCOSE 113 07/05/2014 1101   GLUCOSE 140 (H) 09/23/2012 1040   BUN 16 12/31/2016 1100   BUN 11.8 07/05/2014 1101   CREATININE 0.70 04/09/2017 1405   CREATININE 0.8 07/05/2014 1101   CALCIUM 9.1 12/31/2016 1100   CALCIUM 9.4 07/05/2014 1101   PROT 6.5 12/31/2016 1100   PROT 6.6 07/05/2014 1101   ALBUMIN 3.5 12/31/2016 1100   ALBUMIN 3.4 (L) 07/05/2014 1101   AST 18 12/31/2016 1100   AST 18 07/05/2014 1101   ALT 17 12/31/2016 1100   ALT 15 07/05/2014 1101   ALKPHOS 38 12/31/2016 1100   ALKPHOS 45 07/05/2014 1101   BILITOT 0.6 12/31/2016 1100   BILITOT 0.21 07/05/2014 1101   GFRNONAA >60 04/09/2017 1405   GFRAA >60 04/09/2017 1405   Lab Results    Component Value Date   WBC 5.6 12/31/2016   NEUTROABS 4.6 12/31/2016   HGB 12.7 12/31/2016   HCT 37.4 12/31/2016   MCV 86.0 12/31/2016   PLT 120 (L) 12/31/2016    Imaging:  CLINICAL DATA:  Follow-up treated metastatic disease.  EXAM: MRI HEAD WITHOUT  AND WITH CONTRAST  TECHNIQUE: Multiplanar, multiecho pulse sequences of the brain and surrounding structures were obtained without and with intravenous contrast.  CONTRAST:  34m MULTIHANCE GADOBENATE DIMEGLUMINE 529 MG/ML IV SOLN  COMPARISON:  12/17/2016  FINDINGS: Brain: Status post suboccipital resection of a breast cancer metastasis. The operative cavity has collapsed since prior. Dense enhancement around the cavity that has collapsed with some what lobulated/nodular features that could reflect tumor or treatment change. This area shows hemosiderin staining, hopefully this is evolving scar.  There is enhancement along the folia of the bilateral inferior cerebellum that maintains the cerebellar architecture and is likely post treatment, although infarct related enhancement should have resolved by this time. This enhancement is not particularly nodular to suggest leptomeningeal disease.  T2 hyperintensity in the posterior fossa is without mass effect to indicate edema.  No discrete masslike area for measurement purposes. On sagittal acquisition the postoperative inferior cerebellum is closely applied to the dura, potentially tethered given the fourth ventricle shape. A suboccipital fluid collection has a more full and rounded appearance than prior but is similar in size at 5 x 2 x 2 4 cm.  No evidence of interval metastatic disease to the brain.  No acute infarct, hemorrhage, or hydrocephalus. There is a small volume of fat that is mobile within the left lateral ventricle.  Chronic patchy small FLAIR hyperintensities in the cerebral white matter.  Vascular: Major flow voids and vascular enhancements  are preserved.  Skull and upper cervical spine: Negative for marrow lesion.  Sinuses/Orbits: Negative  IMPRESSION: 1. Suboccipital breast cancer resection with posterior fossa radiation. Nonspecific enhancement at the margins of the surgical cavity which has a new appearance due to interval collapse, as above. 2. Adjacent cerebellar folia enhancement which is decreased and considered postoperative. 3. No evidence of interval metastasis. 4. Similar size but more rounded and full appearing suboccipital fluid collection. 5. Small volume mobile fat within the left lateral ventricle.   Electronically Signed   By: JMonte FantasiaM.D.   On: 04/09/2017 16:38  COak GroveClinician Interpretation: I have personally reviewed the radiological images as listed.  My interpretation, in the context of the patient's clinical presentation, is stable disease   Assessment/Plan Brain metastasis ([place]  Ataxia  Ms. MVitais clinically stable today.  We reviewed her entire history of CNS disease, including the likely contribution of peri-operative infarct to her gait impairment.    She should continue decadron at 198mdaily for now.  She should also continue Meclizine PRN as prior for breakthrough vertigo, trying to avoid daily usage as counseled.   Her examination findings (asymmetric clonus, hyperreflexia, back pain) suggest some underlying myelopathy.  We will recommend addition of MRI total spine to her upcoming brain study to rule out cord involvement.      We spent twenty additional minutes teaching regarding the natural history, biology, and historical experience in the treatment of neurologic complications of cancer. We also provided teaching sheets for the patient to take home as an additional resource.  We appreciate the opportunity to participate in the care of BeMila Ortiz She should return to clinic in April after her series of MRIs.  All questions were answered. The patient  knows to call the clinic with any problems, questions or concerns. No barriers to learning were detected.  The total time spent in the encounter was 60 minutes and more than 50% was on counseling and review of test results   ZaVentura SellersMD Medical Director  of Neuro-Oncology [facility] 05/13/17 9:05 AM
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
## 2017-05-28 ENCOUNTER — Inpatient Hospital Stay (HOSPITAL_BASED_OUTPATIENT_CLINIC_OR_DEPARTMENT_OTHER): Payer: BLUE CROSS/BLUE SHIELD | Admitting: Genetics

## 2017-05-28 ENCOUNTER — Encounter: Payer: Self-pay | Admitting: Genetics

## 2017-05-28 ENCOUNTER — Other Ambulatory Visit: Payer: BLUE CROSS/BLUE SHIELD

## 2017-05-28 ENCOUNTER — Other Ambulatory Visit: Payer: Self-pay

## 2017-05-28 DIAGNOSIS — C50411 Malignant neoplasm of upper-outer quadrant of right female breast: Secondary | ICD-10-CM

## 2017-05-28 DIAGNOSIS — Z808 Family history of malignant neoplasm of other organs or systems: Secondary | ICD-10-CM | POA: Insufficient documentation

## 2017-05-28 DIAGNOSIS — Z803 Family history of malignant neoplasm of breast: Secondary | ICD-10-CM

## 2017-05-28 DIAGNOSIS — Z1379 Encounter for other screening for genetic and chromosomal anomalies: Secondary | ICD-10-CM

## 2017-05-28 DIAGNOSIS — C7931 Secondary malignant neoplasm of brain: Secondary | ICD-10-CM

## 2017-05-28 MED ORDER — RIVAROXABAN 20 MG PO TABS: 20.0000 mg | ORAL_TABLET | Freq: Every day | ORAL | 0 refills | Status: DC

## 2017-05-28 NOTE — Progress Notes (Addendum)
REFERRING PROVIDER: Golden Circle, FNP [address] [place], [place] [address]  PRIMARY PROVIDER:  Golden Circle, FNP  PRIMARY REASON FOR VISIT:  1. Brain metastasis ([place])   2. Family history of brain cancer   3. Family history of breast cancer   4. Primary cancer of upper outer quadrant of right female breast ([place])     HISTORY OF PRESENT ILLNESS:   Tasha Ortiz, a 62 y.o. female, was seen for a [facility] cancer genetics consultation at the request of Ashlyn Bruning due to a personal and family history of cancer.  Tasha Ortiz presents to clinic today to discuss the possibility of a hereditary predisposition to cancer, genetic testing, and to further clarify her future cancer risks, as well as potential cancer risks for family members.   In 2014, at the age of 31, Tasha Ortiz was diagnosed with invasive ductal carcinoma of the right breast. There were 2 synchronous primaries both HER2- ER+.  Tasha Ortiz had a right mastectomy followed by chemotherapy, antiestrogen, radiation, and anti-estrogen.  She now has brain metastasis.    CANCER HISTORY:    Primary cancer of upper outer quadrant of right female breast ([place])   04/16/2012 Initial Biopsy    Right breast mass biopsy invasive ductal carcinoma with abundant mucin in all the 3 masses, right axillary lymph node biopsy positive for IDC, ER 100% PR percent Ki-67 19%, HER-2 negative ratio 0.94      04/21/2012 Breast MRI    Right breast 3 masses 11o'clock position 1.8 cm, now 11:00 position 2 cm, and 10:00 position 2.3 cm with an abnormal level I right axillary lymph node 3 cm      05/20/2012 Surgery    Right breast mastectomy invasive ductal carcinoma grade 2; 1.9 cm, 2.5 cm, 1.8 cm, 1/17 LN positive, second and third massive grade 3      06/24/2012 - 10/07/2012 Chemotherapy    Adjuvant chemotherapy with Taxotere Cytoxan x6 cycles      10/16/2012 - 12/16/2012 Radiation Therapy    Adjuvant radiation therapy by Dr.  [name]      01/07/2013 -  Anti-estrogen oral therapy    Tamoxifen 20 mg daily      11/19/2016 Imaging    MRI brain: 2.9 similar mass within the inferior vermis with surrounding edema and local mass effect partially effacing the fourth ventricle.       11/21/2016 Relapse/Recurrence    Brain biopsy: Metastatic ductal carcinoma the breast positive for ER PR, GATA 3, negative for PAX8 and TTF-1      11/21/2016 Surgery    Suboccipital craniotomy for gross total resection of cerebellar tumor by Dr. [name]        HORMONAL RISK FACTORS:  Menarche was at age 89.  First live birth at age 89.  Ovaries intact: yes.  Hysterectomy: no.  Menopausal status: postmenopausal.  Colonoscopy: 2016- reportedly normal; reports she goes every few years to to digestive problems, but no polyps have ever been found.   Past Medical History:  Diagnosis Date  . Acid reflux   . Allergy    seasonal  . Breast cancer ([place])    right,no iv or blood on right sid, chemo and radiation 2014  . Family history of brain cancer   . Family history of breast cancer   . Heartburn   . Hypercholesteremia    taken off chol meds Dec 2012  . Hypertension   . Hypertension   . Personal history  of chemotherapy 05/2012   rt breast  . Personal history of radiation therapy 10/2012   rt breast    Past Surgical History:  Procedure Laterality Date  . BREAST BIOPSY Right 04/16/2012  . BREAST SURGERY Right 05/20/12   Rt br mastectomy  . CESAREAN SECTION  25 years ago  . colonoscopy    . COLONOSCOPY WITH PROPOFOL N/A 01/18/2015   Procedure: COLONOSCOPY WITH PROPOFOL;  Surgeon: Mauri Pole, MD;  Location: [facility];  Service: Endoscopy;  Laterality: N/A;  . MASTECTOMY Right 05/2012  . MODIFIED MASTECTOMY Right 05/20/2012   Procedure: MODIFIED MASTECTOMY;  Surgeon: Edward Jolly, MD;  Location: [facility];  Service: General;  Laterality: Right;  . PORTACATH PLACEMENT Left 05/20/2012   Procedure: INSERTION  PORT-A-CATH;  Surgeon: Edward Jolly, MD;  Location: [facility];  Service: General;  Laterality: Left;  . SUBOCCIPITAL CRANIECTOMY CERVICAL LAMINECTOMY N/A 11/21/2016   Procedure: SUBOCCIPITAL CRANIECTOMY RESECTION TUMOR;  Surgeon: Newman Pies, MD;  Location: [facility];  Service: Neurosurgery;  Laterality: N/A;  . TUBAL LIGATION      Social History   Socioeconomic History  . Marital status: Legally Separated    Spouse name: None  . Number of children: 2  . Years of education: 77  . Highest education level: None  Social Needs  . Financial resource strain: None  . Food insecurity - worry: None  . Food insecurity - inability: None  . Transportation needs - medical: None  . Transportation needs - non-medical: None  Occupational History  . Occupation: Pension scheme manager  Tobacco Use  . Smoking status: Never Smoker  . Smokeless tobacco: Former Systems developer    Types: Chew  Substance and Sexual Activity  . Alcohol use: No    Alcohol/week: 0.0 oz  . Drug use: No  . Sexual activity: No    Comment: menarche 45, P2, no HRT  Other Topics Concern  . None  Social History Narrative   Fun: Theodoro Kos   Denies religious beliefs effecting health care.      FAMILY HISTORY:  We obtained a detailed, 4-generation family history.  Significant diagnoses are listed below: Family History  Problem Relation Age of Onset  . Hypertension Mother   . Aneurysm Mother 44  . Brain cancer Sister 52  . Breast cancer Cousin 78  . Jaundice Father 63  . Diabetes Daughter   . Breast cancer Cousin 24  . Breast cancer Cousin 22  . Colon cancer Neg Hx    Tasha Ortiz has 82 and 71 year-old daughters with no history of cancer. Tasha Ortiz has no grandchildren.  Tasha Ortiz has 7 sisters and 4 brothers listed below: -1 sister died at 5 due to brain cancer dx at 39.  She died within a year of diagnosis.  Tasha Ortiz did not know if it had started somewhere else and spread to the brain or if it was a brain primary. This  sister had 2 daughters and a son.  -1 sister is a fraternal twin to Tasha Ortiz. -1 sister was diagnosed with cervical cancer and had laser treatment and chemotherapy.   -4 other sisters in their 41's with no history of cancer.  -4 brothers in their 41's with no history of cancer.   No nieces or nephews with any history of cancer.   Tasha Ortiz's father died in his 64's and had Jaundice.  He had 2 brothers and a sister who have no history of cancer. No paternal cousins with any known  diagnosis of cancer. Tasha Ortiz reports she does not know any information about her paternal grandparents and was not as close with this side of the family.   Tasha Ortiz mother died in her 29's due to an aneuurysm.  Tasha Ortiz has 1 maternal uncle who has no history of cancer.  She has a few maternal first cousins, no history of cancer in these cousins.  Tasha Ortiz does not know information about her maternal grandparents.  She does report that 3 maternal distant cousins (perhaps 2nd cousins) had breast cancer. 2 of these distant cousins were sisters and 1 died in her 65's of breast cancer, the other cousin is in her 37's and had breast cancer in her 41's/40's.  The 3rd distant cousin had breast cancer in her 67's and is now in her 64's. She is unsure exactly how these cousins are related to her.   Tasha Ortiz is unaware of previous family history of genetic testing for hereditary cancer risks. Patient's maternal ancestors are of African American/Black descent, and paternal ancestors are of African american/Black/Native American descent. There is no reported Ashkenazi Jewish ancestry. There is no known consanguinity.  GENETIC COUNSELING ASSESSMENT: JAMILLIA CLOSSON is a 62 y.o. female with a personal and family history which is somewhat suggestive of a Hereditary Cancer Predisposition Syndrome. We, therefore, discussed and recommended the following at today's visit.   DISCUSSION: We reviewed the  characteristics, features and inheritance patterns of hereditary cancer syndromes. We also discussed genetic testing, including the appropriate family members to test, the process of testing, insurance coverage and turn-around-time for results. We discussed the implications of a negative, positive and/or variant of uncertain significant result. We recommended Tasha Ortiz pursue genetic testing for the [facility] gene panel. The [facility] gene panel offered by [employer] includes sequencing and deletion/duplication testing of the following 35 genes: APC, ATM, AXIN2, BARD1, BMPR1A, BRCA1, BRCA2, BRIP1, CHD1, CDK4, CDKN2A, CHEK2, EPCAM (large rearrangement only), MLH1, MSH2, MSH6, MUTYH, MSH3, NBN, NITHL1,  PALB2, PMS2, PTEN, RAD51C, RAD51D, SMAD4, STK11,HOXB13, GALNT12, RPS20, RNF43, and TP53. Sequencing was performed for select regions of POLE and POLD1, and large rearrangement analysis was performed for select regions of GREM1.  We discussed that only 5-10% of cancers are associated with a Hereditary cancer predisposition syndrome.  One of the most common hereditary cancer syndromes that increases breast cancer risk is called Hereditary Breast and Ovarian Cancer (HBOC) syndrome.  This syndrome is caused by mutations in the BRCA1 and BRCA2 genes.  This syndrome increases an individual's lifetime risk to develop breast, ovarian, pancreatic, and other types of cancer.  There are also many other cancer predisposition syndromes caused by mutations in several other genes.  We discussed that if she is found to have a mutation in one of these genes, it may impact future medical management recommendations such as increased cancer screenings and consideration of risk reducing surgeries.  A positive result could also have implications for the patient's family members.  A Negative result would mean we were unable to identify a hereditary component to her cancer, but does not rule out the possibility of a  hereditary basis for her cancer.  There could be mutations that are undetectable by current technology, or in genes not yet tested or identified to increase cancer risk.    We discussed the potential to find a Variant of Uncertain Significance or VUS.  These are variants that have not yet been identified as pathogenic or benign, and it is unknown if  this variant is associated with increased cancer risk or if this is a normal finding.  Most VUS's are reclassified to benign or likely benign.   It should not be used to make medical management decisions. With time, we suspect the lab will determine the significance of any VUS's identified if any.   Based on Tasha Ortiz's personal and family history of cancer, does not meet current NCCN criteria for genetic testing.  However, for all metastatic breast cancer patients, testing is still made available due to potential implications on treatment options. We discussed that the laboratory, [facility], has an expanded release program in which patients who's insurance denies to cover genetic testing will not be balance billed for genetic testing.  Due to her diagnosis of metastatic breast cancer, she falls under this program.  The option to have a larger panel ordered through a different lab to analyze the brain cancer genes was offered, however, this lab does not have the same program and there may be an OOP cost of up to $250 for her if she pursued this option.  Tasha Ortiz did not want to have the brain cancer genes analyzed and wanted to proceed with testing through [facility] for the [facility] Panel.   PLAN: After considering the risks, benefits, and limitations, Ms. Royal  provided informed consent to pursue genetic testing.  She has requested her blood be drawn at the time of her next blood draw to consolidate.  We will obtain her sample on 07/08/2017 when she has her next lab appointment, and the blood sample will be sent to [facility] for analysis of the [facility]  Panel. Results should be available within approximately 2-3 weeks' time after obtaining a sample, at which point they will be disclosed by telephone to Ms. Houseworth, as will any additional recommendations warranted by these results. Ms. Skarda will receive a summary of her genetic counseling visit and a copy of her results once available. This information will also be available in Epic. We encouraged Ms. Tagle to remain in contact with cancer genetics annually so that we can continuously update the family history and inform her of any changes in cancer genetics and testing that may be of benefit for her family. Ms. Wake questions were answered to her satisfaction today. Our contact information was provided should additional questions or concerns arise.  Lastly, we encouraged Ms. Graw to remain in contact with cancer genetics annually so that we can continuously update the family history and inform her of any changes in cancer genetics and testing that may be of benefit for this family.   Ms.  Salmi questions were answered to her satisfaction today. Our contact information was provided should additional questions or concerns arise. Thank you for the referral and allowing [place] to share in the care of your patient.   Tana Felts, MS Genetic Counselor [email or web address] phone: [phone]  The patient was seen for a total of 40 minutes in face-to-face genetic counseling.  The patient was accompanied today by her daughter.  This patient was discussed with Drs. Magrinat, Lindi Adie and/or Burr Medico who agrees with the above.

## 2017-06-05 ENCOUNTER — Other Ambulatory Visit: Payer: Self-pay

## 2017-06-05 DIAGNOSIS — C7931 Secondary malignant neoplasm of brain: Secondary | ICD-10-CM

## 2017-06-05 DIAGNOSIS — C50411 Malignant neoplasm of upper-outer quadrant of right female breast: Secondary | ICD-10-CM

## 2017-06-11 ENCOUNTER — Telehealth: Payer: Self-pay | Admitting: Hematology and Oncology

## 2017-06-11 NOTE — Telephone Encounter (Signed)
@   10:00 am left vm for patient's daughter, Nashay Brickley @ [phone] informing her the Disability paperwork has been successfully faxed to [place] Coordinator @ [phone].  Per daughter's request, a copy has been left at the front desk reception area for pick up.

## 2017-06-27 ENCOUNTER — Telehealth: Payer: Self-pay | Admitting: Genetics

## 2017-07-03 ENCOUNTER — Encounter: Payer: Self-pay | Admitting: Internal Medicine

## 2017-07-03 NOTE — Progress Notes (Unsigned)
Received voicemail regarding patient whom needs assistance with Medicaid and other appointment needs.  Called patient to introduce myself as her Arboriculturist. Advised patient that we have Medicaid applications here and I can give her one at her next appointment or she may apply online or via phone with DSS. Patient asked for the website and I gave it to her. Also advised that all uninsured patients receive a 55% discount for any services billed through [facility]. Patient verbalized understanding and initially was going to cancel all of her appointments. Advised her she may keep her appointments if she wishes but when she receives a call regarding appointments, to advise them that she does not have insurance and would need to be billed for services if unable to pay.  Mailed patient a copy of the CHFAA to see if she may qualify for any additional assistance along with my card for any additional financial questions or concerns.

## 2017-07-04 ENCOUNTER — Ambulatory Visit (HOSPITAL_COMMUNITY): Payer: Self-pay

## 2017-07-08 ENCOUNTER — Ambulatory Visit: Payer: BLUE CROSS/BLUE SHIELD | Admitting: Internal Medicine

## 2017-07-08 ENCOUNTER — Other Ambulatory Visit: Payer: BLUE CROSS/BLUE SHIELD

## 2017-07-24 ENCOUNTER — Ambulatory Visit: Payer: BLUE CROSS/BLUE SHIELD | Admitting: Nurse Practitioner

## 2017-07-25 ENCOUNTER — Other Ambulatory Visit: Payer: Self-pay | Admitting: Family

## 2017-08-16 ENCOUNTER — Other Ambulatory Visit: Payer: Self-pay | Admitting: Family

## 2017-08-19 NOTE — Telephone Encounter (Signed)
Patient informed me she did not have insurance, and I informed her about the patient assistance program through the lab to waive cost for genetic testing.  I emailed this form and told her to bring it in when she comes in for labs along with her 1040 tax form.

## 2017-08-21 ENCOUNTER — Encounter (HOSPITAL_COMMUNITY): Payer: Self-pay | Admitting: *Deleted

## 2017-08-21 ENCOUNTER — Other Ambulatory Visit: Payer: Self-pay | Admitting: Hematology and Oncology

## 2017-09-07 ENCOUNTER — Other Ambulatory Visit: Payer: Self-pay | Admitting: Hematology and Oncology

## 2017-10-17 NOTE — Progress Notes (Signed)
FMLA successfully faxed to [employer] at [phone]. Mailed copy to patient address on file. 

## 2017-11-01 ENCOUNTER — Telehealth: Payer: Self-pay

## 2017-11-01 NOTE — Telephone Encounter (Signed)
Returned patient's daughters call regarding medication, Xarelto 20mg  tablet.    Requesting to see if medication can be changed d/t expense.  Will send to provider for further recommendations.

## 2017-11-04 ENCOUNTER — Telehealth: Payer: Self-pay | Admitting: Pharmacist

## 2017-11-04 ENCOUNTER — Telehealth: Payer: Self-pay

## 2017-11-04 NOTE — Telephone Encounter (Signed)
Follow up with patient's daughter regarding prescription change for Xarelto.  Patient still waiting for Medicaid insurance to be approved, and unable to cover out of pocket cost.  Tasha Ortiz in pharmacy able to provide 4 week supply with samples.  Patient's daughter, Tasha Ortiz, aware and will come by this afternoon for pick up.

## 2017-11-04 NOTE — Telephone Encounter (Signed)
Oral Chemotherapy Pharmacist Encounter  Dispensed samples to patient:  Medication: Xarelto 20mg  tablets Instructions: take 1 tablet by mouth once daily with supper Quantity dispensed: 28 Days supply: 28 Manufacturer: Suszanne Finch: [id number] Exp: 07/2019  Samples will be left with collaborative pracrtice RN for distribution to patient  Johny Drilling, PharmD, BCPS, BCOP 11/04/2017 3:58 PM Oral Oncology Clinic [phone]

## 2017-11-05 ENCOUNTER — Encounter: Payer: Self-pay | Admitting: Hematology and Oncology

## 2017-11-05 NOTE — Progress Notes (Signed)
Received signed physician form back from Genia Hotter for Xarelto through [employer]. Faxed completed application with supporting documents to [employer]. Fax received ok per confirmation sheet.

## 2017-11-05 NOTE — Progress Notes (Signed)
Received staff message regarding Xarelto and assistance.  Called patient and introduced myself and advised there is assistance she may apply for through [employer]. Patient states her daughter completed paperwork and was waiting to hear back.  I received an application on my desk a while back and saved with patient income information and no notes or instructions on the medication it was for. When I looked back, application only needed prescription from physician and to be faxed back. I gave to [facility] RN today to have physician complete and will fax back.  Patient asked about the status of her Medicaid. Advised patient to contact the DSS to follow up. She verbalized understanding.

## 2017-11-15 ENCOUNTER — Telehealth: Payer: Self-pay

## 2017-11-15 NOTE — Telephone Encounter (Signed)
Returned pt's call regarding can we look through her medication list and see if it's ok to take Omega 3 fish oil.  Referred pt to call her pharmacy and speak with pharmacist, as they have her list of medications and make sure before she starts fish oil.  Pt voiced understanding and said she will do that - no other needs per pt at this time.

## 2017-11-22 ENCOUNTER — Telehealth: Payer: Self-pay | Admitting: *Deleted

## 2017-11-22 NOTE — Telephone Encounter (Signed)
Received voice mail message from patient stating,"I have Medicaid through the end of August and I need to schedule an appointment with Dr. Mickeal Skinner.  Return number is [phone]."

## 2017-11-25 ENCOUNTER — Telehealth: Payer: Self-pay | Admitting: Internal Medicine

## 2017-11-25 NOTE — Telephone Encounter (Signed)
Scheduled appt per 8/23 sch message - left message for patient with appt date and time. Marland Kitchen/

## 2017-11-26 ENCOUNTER — Other Ambulatory Visit: Payer: Self-pay | Admitting: Internal Medicine

## 2017-11-26 MED ORDER — DEXAMETHASONE 1 MG PO TABS: 1.0000 mg | ORAL_TABLET | Freq: Every day | ORAL | 5 refills | Status: DC

## 2017-11-29 ENCOUNTER — Telehealth: Payer: Self-pay

## 2017-11-29 ENCOUNTER — Encounter: Payer: Self-pay | Admitting: Internal Medicine

## 2017-11-29 ENCOUNTER — Telehealth: Payer: Self-pay | Admitting: Internal Medicine

## 2017-11-29 ENCOUNTER — Other Ambulatory Visit: Payer: Self-pay

## 2017-11-29 ENCOUNTER — Inpatient Hospital Stay: Payer: Medicaid Other | Attending: Internal Medicine | Admitting: Internal Medicine

## 2017-11-29 VITALS — BP 153/91 | HR 87 | Temp 98.5°F | Resp 18 | Ht 64.0 in | Wt 160.2 lb

## 2017-11-29 DIAGNOSIS — Z808 Family history of malignant neoplasm of other organs or systems: Secondary | ICD-10-CM

## 2017-11-29 DIAGNOSIS — C7931 Secondary malignant neoplasm of brain: Secondary | ICD-10-CM | POA: Diagnosis not present

## 2017-11-29 DIAGNOSIS — Z9011 Acquired absence of right breast and nipple: Secondary | ICD-10-CM | POA: Diagnosis not present

## 2017-11-29 DIAGNOSIS — Z9221 Personal history of antineoplastic chemotherapy: Secondary | ICD-10-CM | POA: Diagnosis not present

## 2017-11-29 DIAGNOSIS — Z79811 Long term (current) use of aromatase inhibitors: Secondary | ICD-10-CM | POA: Diagnosis not present

## 2017-11-29 DIAGNOSIS — I10 Essential (primary) hypertension: Secondary | ICD-10-CM

## 2017-11-29 DIAGNOSIS — Z803 Family history of malignant neoplasm of breast: Secondary | ICD-10-CM | POA: Insufficient documentation

## 2017-11-29 DIAGNOSIS — Z800 Family history of malignant neoplasm of digestive organs: Secondary | ICD-10-CM | POA: Insufficient documentation

## 2017-11-29 DIAGNOSIS — G959 Disease of spinal cord, unspecified: Secondary | ICD-10-CM | POA: Insufficient documentation

## 2017-11-29 DIAGNOSIS — C773 Secondary and unspecified malignant neoplasm of axilla and upper limb lymph nodes: Secondary | ICD-10-CM | POA: Insufficient documentation

## 2017-11-29 DIAGNOSIS — C50411 Malignant neoplasm of upper-outer quadrant of right female breast: Secondary | ICD-10-CM | POA: Diagnosis present

## 2017-11-29 DIAGNOSIS — Z923 Personal history of irradiation: Secondary | ICD-10-CM | POA: Insufficient documentation

## 2017-11-29 DIAGNOSIS — Z7901 Long term (current) use of anticoagulants: Secondary | ICD-10-CM | POA: Diagnosis not present

## 2017-11-29 DIAGNOSIS — Z170 Estrogen receptor positive status [ER+]: Secondary | ICD-10-CM | POA: Insufficient documentation

## 2017-11-29 DIAGNOSIS — K219 Gastro-esophageal reflux disease without esophagitis: Secondary | ICD-10-CM | POA: Insufficient documentation

## 2017-11-29 DIAGNOSIS — Z79899 Other long term (current) drug therapy: Secondary | ICD-10-CM | POA: Diagnosis not present

## 2017-11-29 DIAGNOSIS — E7800 Pure hypercholesterolemia, unspecified: Secondary | ICD-10-CM | POA: Diagnosis not present

## 2017-11-29 MED ORDER — RIVAROXABAN 20 MG PO TABS: ORAL_TABLET | ORAL | 0 refills | Status: DC

## 2017-11-29 MED ORDER — ANASTROZOLE 1 MG PO TABS: 1.0000 mg | ORAL_TABLET | Freq: Every day | ORAL | 3 refills | Status: DC

## 2017-11-29 NOTE — Telephone Encounter (Signed)
Spoke with patient daughter Pollyann Glen and she stated that the patient would like a handicap placard from from the MD to take to the [facility]. Explained that can have Dr. Mickeal Skinner fill out the form and [name] stated that she will call prior to picking up the form on Tuesday along with picking up today's progress notes for [facility] recertification.

## 2017-11-29 NOTE — Telephone Encounter (Signed)
Appts scheduled AVS/Calendar printed per 8/30 los

## 2017-11-29 NOTE — Progress Notes (Signed)
[facility] [place], [place] [address] [phone]   Interval Evaluation  Date of Service: 11/29/17 Patient Name: Tasha Ortiz Patient MRN: [id number] Patient DOB: 1955/11/28 Provider: Ventura Sellers, MD  Identifying Statement:  Tasha Ortiz is a 62 y.o. female with Brain metastasis ([place]) [C79.31]   Primary Cancer: Breast ER+/PR+/HER-  Oncologic History:   Primary cancer of upper outer quadrant of right female breast ([place])   04/16/2012 Initial Biopsy    Right breast mass biopsy invasive ductal carcinoma with abundant mucin in all the 3 masses, right axillary lymph node biopsy positive for IDC, ER 100% PR percent Ki-67 19%, HER-2 negative ratio 0.94    04/21/2012 Breast MRI    Right breast 3 masses 11o'clock position 1.8 cm, now 11:00 position 2 cm, and 10:00 position 2.3 cm with an abnormal level I right axillary lymph node 3 cm    05/20/2012 Surgery    Right breast mastectomy invasive ductal carcinoma grade 2; 1.9 cm, 2.5 cm, 1.8 cm, 1/17 LN positive, second and third massive grade 3    06/24/2012 - 10/07/2012 Chemotherapy    Adjuvant chemotherapy with Taxotere Cytoxan x6 cycles    10/16/2012 - 12/16/2012 Radiation Therapy    Adjuvant radiation therapy by Dr. [name]    01/07/2013 -  Anti-estrogen oral therapy    Tamoxifen 20 mg daily    11/19/2016 Imaging    MRI brain: 2.9 similar mass within the inferior vermis with surrounding edema and local mass effect partially effacing the fourth ventricle.     11/21/2016 Relapse/Recurrence    Brain biopsy: Metastatic ductal carcinoma the breast positive for ER PR, GATA 3, negative for PAX8 and TTF-1    11/21/2016 Surgery    Suboccipital craniotomy for gross total resection of cerebellar tumor by Dr. [name]     Interval History:  Tasha Ortiz presents today after 8 month gap in follow up.  She had lost insurance, and has unfortunately not been able to obtain  any follow up scans in the interim.  She also lost coverage for physical therapy sessions which had been helping considerably.  She describes static deficits, with significant impairment in gait and coordination as prior.  She thinks both issues were helped modestly by attention from PT.  Still ambulating with a walker at all times.  No issues with new back pain or urge incontinence.     H+P: presents today to review her CNS disease burden from metastatic breast cancer, and associated neurologic deficits.  She initially described feeling "off balance" this summer, which led to an MRI in August demonstrating large posterior fossa metastasis.  This was resected on 11/21/16, and followed by post-op SRS to the resection cavity.  She describes clinical stability in the post-operative period, which was followed by significant decline during immediately following radiation, described as "inability to ambulate, dress self, feed self" due to poor balance and coordination.  These deficits have reportedly improved dramatically since that time- she is still reliant on a walker, but all other ADL's she is able to perform independently.  Occasionally she has bouts of vertigo, which are effectively treated with Meclizine.   Most recent MRI in January was stable.  She does acknowledge some neck and back pain recently along midline.  No urinary incontinence.  Dr. Lindi Adie had prescribed 69m decadron daily for appetite stimulation, which she says has helped considerably and would like to continue.  Medications: Current Outpatient  Medications on File Prior to Visit  Medication Sig Dispense Refill  . acetaminophen (TYLENOL) 325 MG tablet Take 650 mg by mouth every 6 (six) hours as needed for moderate pain.    Marland Kitchen amLODipine (NORVASC) 5 MG tablet Take 5 mg by mouth daily.  1  . anastrozole (ARIMIDEX) 1 MG tablet Take 1 tablet (1 mg total) by mouth daily. 90 tablet 3  . dexamethasone (DECADRON) 1 MG tablet Take 1 tablet (1 mg  total) by mouth daily. 30 tablet 5  . fluticasone (FLONASE) 50 MCG/ACT nasal spray Place 2 sprays into both nostrils daily. (Patient not taking: Reported on 05/13/2017) 16 g 3  . meclizine (ANTIVERT) 12.5 MG tablet Take 1 tablet (12.5 mg total) by mouth 3 (three) times daily as needed for dizziness. 30 tablet 1  . ondansetron (ZOFRAN) 8 MG tablet Take 1 tablet (8 mg total) by mouth every 8 (eight) hours as needed for nausea or vomiting. (Patient not taking: Reported on 05/13/2017) 20 tablet 0  . polyethylene glycol (MIRALAX / GLYCOLAX) packet Take 17 g by mouth.    . pravastatin (PRAVACHOL) 40 MG tablet Take 1 tablet (40 mg total) by mouth daily. 90 tablet 1  . XARELTO 20 MG TABS tablet TAKE 1 TABLET (20 MG TOTAL) BY MOUTH DAILY WITH SUPPER 90 tablet 0  . [DISCONTINUED] Prochlorperazine Maleate (COMPAZINE PO) Take by mouth.     No current facility-administered medications on file prior to visit.     Allergies:  Allergies  Allergen Reactions  . Lisinopril Swelling   Past Medical History:  Past Medical History:  Diagnosis Date  . Acid reflux   . Allergy    seasonal  . Breast cancer ([place])    right,no iv or blood on right sid, chemo and radiation 2014  . Family history of brain cancer   . Family history of breast cancer   . Heartburn   . Hypercholesteremia    taken off chol meds Dec 2012  . Hypertension   . Hypertension   . Personal history of chemotherapy 05/2012   rt breast  . Personal history of radiation therapy 10/2012   rt breast   Past Surgical History:  Past Surgical History:  Procedure Laterality Date  . BREAST BIOPSY Right 04/16/2012  . BREAST SURGERY Right 05/20/12   Rt br mastectomy  . CESAREAN SECTION  25 years ago  . colonoscopy    . COLONOSCOPY WITH PROPOFOL N/A 01/18/2015   Procedure: COLONOSCOPY WITH PROPOFOL;  Surgeon: Mauri Pole, MD;  Location: [facility];  Service: Endoscopy;  Laterality: N/A;  . MASTECTOMY Right 05/2012  . MODIFIED MASTECTOMY  Right 05/20/2012   Procedure: MODIFIED MASTECTOMY;  Surgeon: Edward Jolly, MD;  Location: [facility];  Service: General;  Laterality: Right;  . PORTACATH PLACEMENT Left 05/20/2012   Procedure: INSERTION PORT-A-CATH;  Surgeon: Edward Jolly, MD;  Location: [facility];  Service: General;  Laterality: Left;  . SUBOCCIPITAL CRANIECTOMY CERVICAL LAMINECTOMY N/A 11/21/2016   Procedure: SUBOCCIPITAL CRANIECTOMY RESECTION TUMOR;  Surgeon: Newman Pies, MD;  Location: [facility];  Service: Neurosurgery;  Laterality: N/A;  . TUBAL LIGATION     Social History:  Social History   Socioeconomic History  . Marital status: Legally Separated    Spouse name: Not on file  . Number of children: 2  . Years of education: 26  . Highest education level: Not on file  Occupational History  . 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 Financial resource strain: Not  on file  . Food insecurity:    Worry: Not on file    Inability: Not on file  . Transportation needs:    Medical: Not on file    Non-medical: Not on file  Tobacco Use  . Smoking status: Never Smoker  . Smokeless tobacco: Former Systems developer    Types: Chew  Substance and Sexual Activity  . Alcohol use: No    Alcohol/week: 0.0 standard drinks  . Drug use: No  . Sexual activity: Never    Comment: menarche 8, P2, no HRT  Lifestyle  . Physical activity:    Days per week: Not on file    Minutes per session: Not on file  . Stress: Not on file  Relationships  . Social connections:    Talks on phone: Not on file    Gets together: Not on file    Attends religious service: Not on file    Active member of club or organization: Not on file    Attends meetings of clubs or organizations: Not on file    Relationship status: Not on file  . Intimate partner violence:    Fear of current or ex partner: Not on file    Emotionally abused: Not on file    Physically abused: Not on file    Forced sexual activity: Not on file  Other Topics Concern  . Not on  file  Social History Narrative   Fun: Theodoro Kos   Denies religious beliefs effecting health care.    Family History:  Family History  Problem Relation Age of Onset  . Hypertension Mother   . Aneurysm Mother 37  . Brain cancer Sister 68  . Breast cancer Cousin 62  . Jaundice Father 3  . Diabetes Daughter   . Breast cancer Cousin 49  . Breast cancer Cousin 31  . Colon cancer Neg Hx     Review of Systems: Constitutional: Denies fevers, chills or abnormal weight loss Eyes: blurry distance vision Ears, nose, mouth, throat, and face: Denies mucositis or sore throat Respiratory: Denies cough, dyspnea or wheezes Cardiovascular: Denies palpitation, chest discomfort or lower extremity swelling Gastrointestinal:  Denies nausea, constipation, diarrhea GU: Denies dysuria or incontinence Skin: Denies abnormal skin rashes Neurological: Per HPI Musculoskeletal: midline back pain Behavioral/Psych: Denies anxiety, disturbance in thought content, and mood instability   Physical Exam: Vitals:   11/29/17 1144  BP: (!) 153/91  Pulse: 87  Resp: 18  Temp: 98.5 F (36.9 C)  SpO2: 100%   KPS: 70. General: Alert, cooperative, pleasant, in no acute distress.  In wheelchair Head: Craniotomy scar noted, dry and intact. EENT: No conjunctival injection or scleral icterus. Oral mucosa moist Lungs: Resp effort normal Cardiac: Regular rate and rhythm Abdomen: Soft, non-distended abdomen Skin: No rashes cyanosis or petechiae. Extremities: No clubbing or edema  Neurologic Exam: Mental Status: Awake, alert, attentive to examiner. Oriented to self and environment. Language is fluent with intact comprehension.  Cranial Nerves: Visual acuity is grossly normal. Visual fields are full. Extra-ocular movements intact. No ptosis. Face is symmetric, tongue midline. Motor: Tone and bulk are normal. Power is full in both arms and legs. Hyperreflexia noted in bilateral legs (3+) with sustained clonus in right >  left ankle.  Arm reflexes normal. Mild dysmetria noted on bilateral finger to nose. Sensory: Intact to light touch and temperature Gait: Non-ambulatory   Labs: I have reviewed the data as listed    Component Value Date/Time   NA 137 12/31/2016 1100   NA  143 07/05/2014 1101   K 3.7 12/31/2016 1100   K 3.7 07/05/2014 1101   CL 101 12/31/2016 1100   CL 103 09/23/2012 1040   CO2 28 12/31/2016 1100   CO2 27 07/05/2014 1101   GLUCOSE 180 (H) 12/31/2016 1100   GLUCOSE 113 07/05/2014 1101   GLUCOSE 140 (H) 09/23/2012 1040   BUN 16 12/31/2016 1100   BUN 11.8 07/05/2014 1101   CREATININE 0.70 04/09/2017 1405   CREATININE 0.8 07/05/2014 1101   CALCIUM 9.1 12/31/2016 1100   CALCIUM 9.4 07/05/2014 1101   PROT 6.5 12/31/2016 1100   PROT 6.6 07/05/2014 1101   ALBUMIN 3.5 12/31/2016 1100   ALBUMIN 3.4 (L) 07/05/2014 1101   AST 18 12/31/2016 1100   AST 18 07/05/2014 1101   ALT 17 12/31/2016 1100   ALT 15 07/05/2014 1101   ALKPHOS 38 12/31/2016 1100   ALKPHOS 45 07/05/2014 1101   BILITOT 0.6 12/31/2016 1100   BILITOT 0.21 07/05/2014 1101   GFRNONAA >60 04/09/2017 1405   GFRAA >60 04/09/2017 1405   Lab Results  Component Value Date   WBC 5.6 12/31/2016   NEUTROABS 4.6 12/31/2016   HGB 12.7 12/31/2016   HCT 37.4 12/31/2016   MCV 86.0 12/31/2016   PLT 120 (L) 12/31/2016     Assessment/Plan Brain metastasis ([place]) - Plan: Ambulatory referral to Home Health  Tasha Ortiz is clinically stable today.  She does not have CNS imaging for review since January 2019.  We recommend she obtain an MRI brain with and w/o contrast for evaluation.  Can defer MRI of the c-spine for the time being.  Myelopathy has not progressed.  She should continue decadron at 55m daily for now.  She should also continue Meclizine PRN as prior for breakthrough vertigo, trying to avoid daily usage as counseled.   Will re-order physical therapy for her, given its impact on her balance and coordination.  We  appreciate the opportunity to participate in the care of BMila Ortiz  She should return to clinic in ~2 months after workup is completed. She does have scheduled follow up with Dr. GLindi Adie  All questions were answered. The patient knows to call the clinic with any problems, questions or concerns. No barriers to learning were detected.  The total time spent in the encounter was 40 minutes and more than 50% was on counseling and review of test results   ZVentura Sellers MD Medical Director of Neuro-Oncology [facility]08/30/19 11:49 AM

## 2017-12-03 ENCOUNTER — Encounter: Payer: Self-pay | Admitting: Hematology and Oncology

## 2017-12-03 NOTE — Progress Notes (Signed)
Received fax from [employer] requesting prescription and insurance card for Xarelto.  Called patient to ask if she had picked up prescription and if Medicaid covered. She states she did pick it up and Medicaid did cover. [employer] application should no longer be needed if Medicaid is covering at 100%.

## 2017-12-05 ENCOUNTER — Telehealth: Payer: Self-pay | Admitting: Internal Medicine

## 2017-12-05 NOTE — Telephone Encounter (Signed)
Printed medical records for [facility], Release ID: [id number]

## 2017-12-10 NOTE — Progress Notes (Signed)
FMLA successfully faxed to [employer] at [phone]. Mailed copy to patient address on file. 

## 2017-12-19 ENCOUNTER — Other Ambulatory Visit: Payer: Self-pay

## 2017-12-19 ENCOUNTER — Ambulatory Visit (HOSPITAL_COMMUNITY)
Admission: EM | Admit: 2017-12-19 | Discharge: 2017-12-19 | Disposition: A | Discharge status: Home / Self Care | Payer: Medicaid Other | Attending: Family Medicine | Admitting: Family Medicine

## 2017-12-19 ENCOUNTER — Other Ambulatory Visit: Payer: Self-pay | Admitting: *Deleted

## 2017-12-19 ENCOUNTER — Encounter (HOSPITAL_COMMUNITY): Payer: Self-pay

## 2017-12-19 ENCOUNTER — Telehealth: Payer: Self-pay | Admitting: Family

## 2017-12-19 DIAGNOSIS — I10 Essential (primary) hypertension: Secondary | ICD-10-CM

## 2017-12-19 MED ORDER — AMLODIPINE BESYLATE 10 MG PO TABS: 10.0000 mg | ORAL_TABLET | Freq: Every day | ORAL | 1 refills | Status: DC

## 2017-12-19 NOTE — ED Provider Notes (Signed)
[facility]   [id number] 12/19/17 Arrival Time: 9371  ASSESSMENT & PLAN:  1. Essential hypertension    Meds ordered this encounter  Medications  . amLODipine (NORVASC) 10 MG tablet    Sig: Take 1 tablet (10 mg total) by mouth daily.    Dispense:  30 tablet    Refill:  1   Plans f/u with PCP. May f/u here if needed.  Reviewed expectations re: course of current medical issues. Questions answered. Outlined signs and symptoms indicating need for more acute intervention. Patient verbalized understanding. After Visit Summary given.   SUBJECTIVE:  Tasha Ortiz is a 62 y.o. female who presents with concerns regarding increased blood pressures. She reports that she is taking Norvasc 5mg  daily. Dose was decreased from 10mg  awhile ago.  Past Medical History:  Diagnosis Date  . Acid reflux   . Allergy    seasonal  . Breast cancer ([place])    right,no iv or blood on right sid, chemo and radiation 2014  . Family history of brain cancer   . Family history of breast cancer   . Heartburn   . Hypercholesteremia    taken off chol meds Dec 2012  . Hypertension   . Hypertension   . Personal history of chemotherapy 05/2012   rt breast  . Personal history of radiation therapy 10/2012   rt breast   Physical therapist noted increased blood pressures and recommended evaluation.  She reports taking medications as instructed, no medication side effects noted, no TIA's, no chest pain on exertion, no dyspnea on exertion and no swelling of ankles.  Denies symptoms of chest pain, palpations, orthopnea, nocturnal dyspnea, or LE edema.  Social History   Tobacco Use  Smoking Status Never Smoker  Smokeless Tobacco Former Systems developer  . Types: Chew    ROS: As per HPI.  OBJECTIVE:  Vitals:   12/19/17 1450 12/19/17 1451  BP:  (!) 175/99  Pulse:  (!) 107  Resp:  18  Temp:  99 F (37.2 C)  TempSrc:  Oral  SpO2:  98%  Weight: 72.6 kg     General appearance: alert; no  distress Eyes: PERRLA; EOMI HENT: normocephalic; atraumatic Neck: supple Lungs: clear to auscultation bilaterally Heart: regular rate and rhythm without murmer; recheck pulse 96 Extremities: no edema; symmetrical with no gross deformities Skin: warm and dry Psychological: alert and cooperative; normal mood and affect  ECG: Orders placed or performed during the hospital encounter of 12/31/16  . EKG 12-Lead  . EKG 12-Lead  . ED EKG  . ED EKG  . EKG    Allergies  Allergen Reactions  . Lisinopril Swelling    Past Medical History:  Diagnosis Date  . Acid reflux   . Allergy    seasonal  . Breast cancer ([facility])    right,no iv or blood on right sid, chemo and radiation 2014  . Family history of brain cancer   . Family history of breast cancer   . Heartburn   . Hypercholesteremia    taken off chol meds Dec 2012  . Hypertension   . Hypertension   . Personal history of chemotherapy 05/2012   rt breast  . Personal history of radiation therapy 10/2012   rt breast   Social History   Socioeconomic History  . Marital status: Legally Separated    Spouse name: Not on file  . Number of children: 2  . Years of education: 57  . Highest education level: Not on file  Occupational History  . 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 Financial resource strain: Not on file  . Food insecurity:    Worry: Not on file    Inability: Not on file  . Transportation needs:    Medical: Not on file    Non-medical: Not on file  Tobacco Use  . Smoking status: Never Smoker  . Smokeless tobacco: Former Systems developer    Types: Chew  Substance and Sexual Activity  . Alcohol use: No    Alcohol/week: 0.0 standard drinks  . Drug use: No  . Sexual activity: Never    Comment: menarche 50, P2, no HRT  Lifestyle  . Physical activity:    Days per week: Not on file    Minutes per session: Not on file  . Stress: Not on file  Relationships  . Social connections:    Talks on phone: Not on file     Gets together: Not on file    Attends religious service: Not on file    Active member of club or organization: Not on file    Attends meetings of clubs or organizations: Not on file    Relationship status: Not on file  . Intimate partner violence:    Fear of current or ex partner: Not on file    Emotionally abused: Not on file    Physically abused: Not on file    Forced sexual activity: Not on file  Other Topics Concern  . Not on file  Social History Narrative   Fun: Theodoro Kos   Denies religious beliefs effecting health care.    Family History  Problem Relation Age of Onset  . Hypertension Mother   . Aneurysm Mother 82  . Brain cancer Sister 2  . Breast cancer Cousin 28  . Jaundice Father 13  . Diabetes Daughter   . Breast cancer Cousin 64  . Breast cancer Cousin 45  . Colon cancer Neg Hx    Past Surgical History:  Procedure Laterality Date  . BREAST BIOPSY Right 04/16/2012  . BREAST SURGERY Right 05/20/12   Rt br mastectomy  . CESAREAN SECTION  25 years ago  . colonoscopy    . COLONOSCOPY WITH PROPOFOL N/A 01/18/2015   Procedure: COLONOSCOPY WITH PROPOFOL;  Surgeon: Mauri Pole, MD;  [facility];  Service: Endoscopy;  Laterality: N/A;  . MASTECTOMY Right 05/2012  . MODIFIED MASTECTOMY Right 05/20/2012   Procedure: MODIFIED MASTECTOMY;  Surgeon: Edward Jolly, MD;  Location: [facility];  Service: General;  Laterality: Right;  . PORTACATH PLACEMENT Left 05/20/2012   Procedure: INSERTION PORT-A-CATH;  Surgeon: Edward Jolly, MD;  Location: [facility];  Service: General;  Laterality: Left;  . SUBOCCIPITAL CRANIECTOMY CERVICAL LAMINECTOMY N/A 11/21/2016   Procedure: SUBOCCIPITAL CRANIECTOMY RESECTION TUMOR;  Surgeon: Newman Pies, MD;  Location: [facility];  Service: Neurosurgery;  Laterality: N/A;  . TUBAL LIGATION        Vanessa Kick, MD 12/28/17 1014

## 2017-12-19 NOTE — ED Triage Notes (Signed)
Pt states she was told to come here by her other doctor for her high blood pressure. 172/105

## 2017-12-19 NOTE — Telephone Encounter (Signed)
Gwynneth Macleod, RN with [facility] called in and says "Tasha Ortiz's BP is 172/105. I have been having her to watch it and keep check on her readings and today is the highest it has been. I called another office and was told to call you." I asked about symptoms of headache, blurred vision, the patient says "I have blurred vision but this is nothing new. I need to get an eye doctor appointment." I asked is she able to come in for a visit, she says "I have to wait on my daughter to check her work schedule. She's at work right now." I advised the patient to go to the ED or UC with her BP that high and the blurred vision. The nurse says "I will contact her daughter and let her know she will need to go to the ED or UC for her BP." I advised if she didn't take her there to call back to schedule a visit tomorrow with a provider and that the patient will need to establish care with another provider, Vee and patient verbalized understanding.

## 2017-12-23 ENCOUNTER — Ambulatory Visit
Admission: RE | Admit: 2017-12-23 | Discharge: 2017-12-23 | Disposition: A | Discharge status: Home / Self Care | Payer: Medicaid Other | Source: Ambulatory Visit | Attending: Internal Medicine | Admitting: Internal Medicine

## 2017-12-23 DIAGNOSIS — C7931 Secondary malignant neoplasm of brain: Secondary | ICD-10-CM

## 2017-12-23 MED ORDER — GADOBENATE DIMEGLUMINE 529 MG/ML IV SOLN
15.0000 mL | Freq: Once | INTRAVENOUS | Status: AC | PRN
  Administered 2017-12-23: 15 mL via INTRAVENOUS

## 2017-12-24 ENCOUNTER — Other Ambulatory Visit: Payer: Self-pay | Admitting: Radiation Therapy

## 2017-12-25 ENCOUNTER — Other Ambulatory Visit: Payer: Self-pay

## 2017-12-25 ENCOUNTER — Telehealth: Payer: Self-pay

## 2017-12-25 DIAGNOSIS — C7931 Secondary malignant neoplasm of brain: Secondary | ICD-10-CM

## 2017-12-25 NOTE — Telephone Encounter (Signed)
Received phone message form Herbert Deaner PT with [facility] requesting verbal okay for PT 1x/week x 2 weeks and a new referral for patient for RN visits to educate on medications and to follow up on BP management. PT stated that the patient BP has been elevated 160/104 so he had the patient go to [facility] urgent care and a new BP medication was prescribed. Spoke with Santiago Glad with [facility] with referral for skilled RN care.

## 2017-12-30 ENCOUNTER — Inpatient Hospital Stay: Payer: Medicaid Other | Attending: Internal Medicine

## 2017-12-31 ENCOUNTER — Telehealth: Payer: Self-pay

## 2017-12-31 ENCOUNTER — Telehealth: Payer: Self-pay | Admitting: *Deleted

## 2017-12-31 ENCOUNTER — Encounter: Payer: Self-pay | Admitting: Hematology and Oncology

## 2017-12-31 NOTE — Telephone Encounter (Signed)
Team health message recd from [place] with [facility] [phone].  Wanted additional skilled nursing home health verbal orders for once weekly for 6 weeks to help with blood pressure management.  Verbal orders given.

## 2017-12-31 NOTE — Telephone Encounter (Signed)
See previous note

## 2017-12-31 NOTE — Telephone Encounter (Signed)
Returned patient's daughter call regarding financial assistance with medication Xarelto.  Message sent to financial advocate.  Left message with daughter with information and contact information for return call.

## 2017-12-31 NOTE — Progress Notes (Signed)
Reviewed notes and reached out to [employer](Sadie)Patient Assistance regarding patient's application I faxed on 09/07/10. Per Sadie application was received an additional information was requested from patient via letter in mail and was not received.   I called patient's daughter(Angela) whom states her mom did have Medicaid but it appeared as of today does not have. Asked if they received any correspondence from Medicaid advising what was needed to continue as well as letter from [employer] and [employer]. She states she does not recall any mail from either. Advised her to follow up with [place] regarding Medicaid and ask to speak with supervisor if no response to follow up on Medicaid which would cover visits as well as medications as before. Gave her the information [employer] states they they were requesting which was her 2018 tax return and proof of insurance or denial(from Medicaid) as well as phone number to follow up with them regarding the mail. She verbalized understanding.

## 2018-01-01 ENCOUNTER — Telehealth: Payer: Self-pay | Admitting: *Deleted

## 2018-01-01 NOTE — Telephone Encounter (Signed)
Medical Records faxed to [facility]; release [id number]

## 2018-01-01 NOTE — Progress Notes (Signed)
Brain and Spine Tumor Board Documentation  Tasha Ortiz was presented by Cecil Cobbs, MD at Brain and Spine Tumor Board on 01/01/2018, which included representatives from neuro oncology, radiation oncology, navigation, pathology, radiology.  Tasha Ortiz was presented as a current patient with history of the following treatments:  .  Additionally, we reviewed previous medical and familial history, history of present illness, and recent lab results along with all available histopathologic and imaging studies. The tumor board considered available treatment options and made the following recommendations:  Active surveillance  Tumor board is a meeting of clinicians from various specialty areas who evaluate and discuss patients for whom a multidisciplinary approach is being considered. Final determinations in the plan of care are those of the provider(s). The responsibility for follow up of recommendations given during tumor board is that of the provider.   Today's extended care, comprehensive team conference, Tasha Ortiz was not present for the discussion and was not examined.

## 2018-01-06 ENCOUNTER — Other Ambulatory Visit: Payer: Self-pay | Admitting: Hematology and Oncology

## 2018-01-09 ENCOUNTER — Telehealth: Payer: Self-pay | Admitting: *Deleted

## 2018-01-09 NOTE — Telephone Encounter (Signed)
Received call from [facility] Physical Therapist to advise that patient successfully completed home PT and has exercises to be able to continue her PT on her own.

## 2018-01-22 DIAGNOSIS — I2699 Other pulmonary embolism without acute cor pulmonale: Secondary | ICD-10-CM | POA: Insufficient documentation

## 2018-01-28 ENCOUNTER — Telehealth: Payer: Self-pay | Admitting: *Deleted

## 2018-01-28 NOTE — Telephone Encounter (Signed)
Patient called to advise that she does not have insurance coverage and she will not be able to come to the visit and that she saw her results on my chart.

## 2018-01-29 ENCOUNTER — Inpatient Hospital Stay: Payer: Medicaid Other | Admitting: Internal Medicine

## 2018-01-31 ENCOUNTER — Encounter: Payer: Self-pay | Admitting: Hematology and Oncology

## 2018-01-31 NOTE — Progress Notes (Signed)
Received approval fax from [employer] for Xarelto on 01/22/18. PA approved 01/22/18-01/23/19.  Placed in HIM bin to be scanned.

## 2018-03-14 ENCOUNTER — Telehealth: Payer: Self-pay

## 2018-03-14 NOTE — Telephone Encounter (Signed)
Pt called to report that she will need to cancel appt with Dr.Gudena next week due to loss of current insurance. Pt trying to reaaply to medicaid but will not be approved until next yr. Told pt to call next month and update of what her status is. Will cancel appt for now. Pt remains on xarelto.

## 2018-03-16 NOTE — Assessment & Plan Note (Signed)
Metastatic breast cancer with brain metastases: Cerebellar lesion 2.9 cm in size status post gross total resection and Dr. [name] on 11/21/2016  (Right breast invasive ductal carcinoma multifocal disease ER/PR positive HER-2 negative T2, N1, M0 stage IIB status post mastectomy, adjuvant chemotherapy and radiation now on antiestrogen therapy with tamoxifen since October 2014.)  Because of the brain metastases, we switched her to letrozole therapy.  Letrozole toxicities: Patient denies any hot flashes or myalgias. Our plan is to obtain CT chest abdomen pelvis in February and follow-up after that.  Decreased appetite: I resumed dexamethasone half a tablet a day.

## 2018-03-17 ENCOUNTER — Inpatient Hospital Stay: Payer: Self-pay | Attending: Internal Medicine | Admitting: Hematology and Oncology

## 2018-03-21 ENCOUNTER — Other Ambulatory Visit: Payer: Self-pay | Admitting: Hematology and Oncology

## 2018-03-31 IMAGING — MR MR HEAD WO/W CM
10 of 12 series · 24 of 48 positions shown · IV contrast (cc MULTI)
Comparison: MRI head 11/22/2016, MRI 11/19/2016

CLINICAL DATA: Metastatic breast cancer. Postop craniotomy for
tumor resection. Stereotactic radiosurgery planning.

EXAM:
MRI HEAD WITHOUT AND WITH CONTRAST
TECHNIQUE: Multiplanar, multiecho pulse sequences of the brain and surrounding
structures were obtained without and with intravenous contrast.
CONTRAST:  16mL MULTIHANCE GADOBENATE DIMEGLUMINE 529 MG/ML IV SOLN

[Series 2: FLAIR · sagittal · 3.0mm · 0.47mm/px · 2 of 43 slices shown (1 of 3)]
[im 1/43]
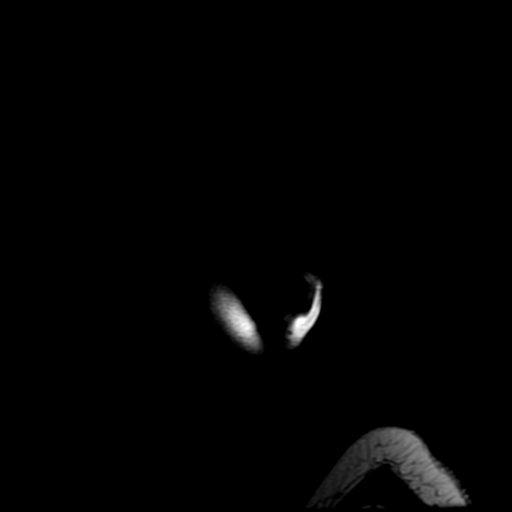
[im 43/43]
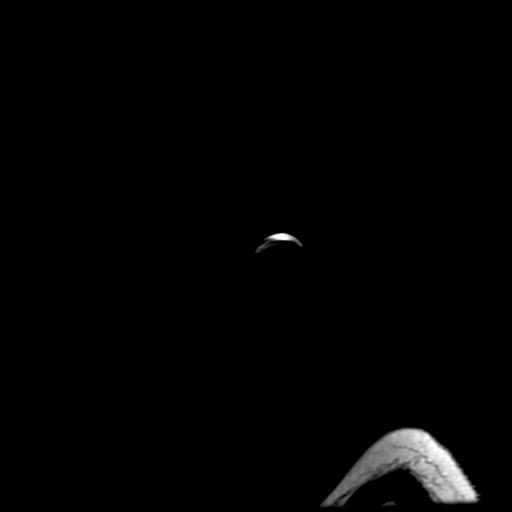

[Series 4: DWI · axial · 3.0mm · 0.94mm/px · z∈[-79,+67]mm · 5 of 100 slices shown]
[im 1/100]
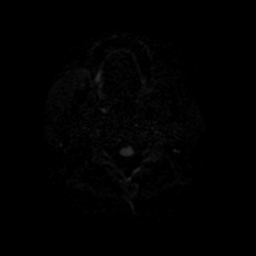
[im 25/100]
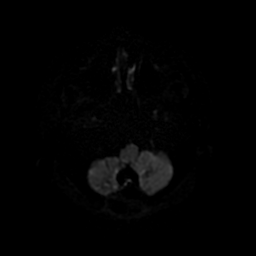
[im 50/100]
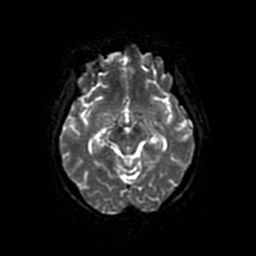
[im 75/100]
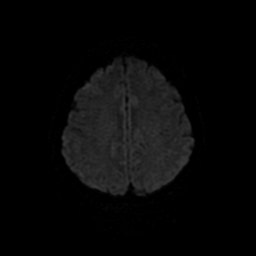
[im 100/100]
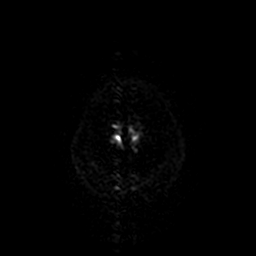

[Series 5: FLAIR · axial · 3.0mm · 0.41mm/px · z∈[-81,+96]mm · 3 of 60 slices shown (2 of 3)]
[im 1/60]
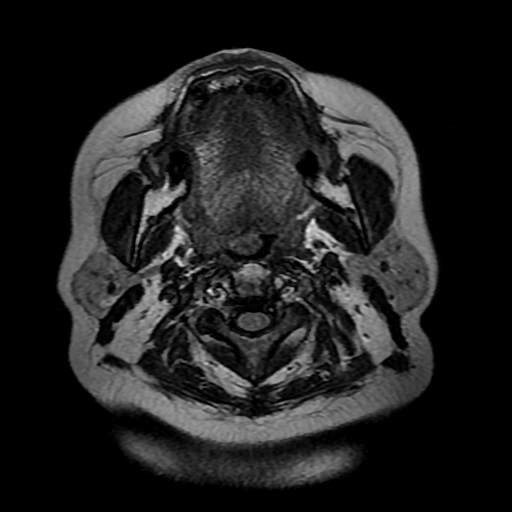
[im 30/60]
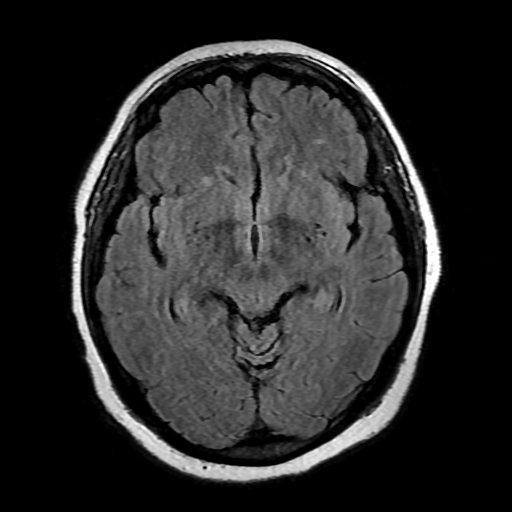
[im 60/60]
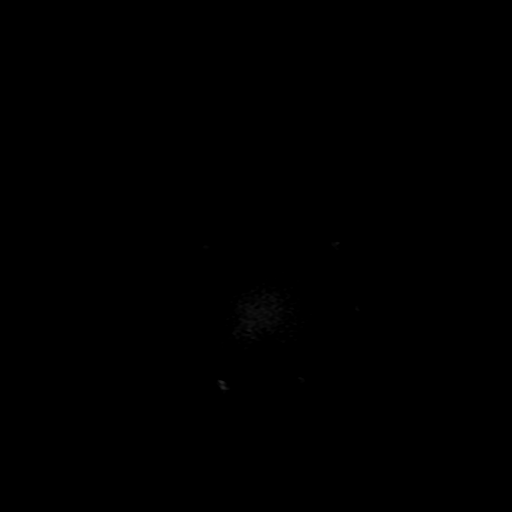

[Series 7: T2 · axial · 5.0mm · 0.47mm/px · 1 of 28 slices shown]
[im 1/28]
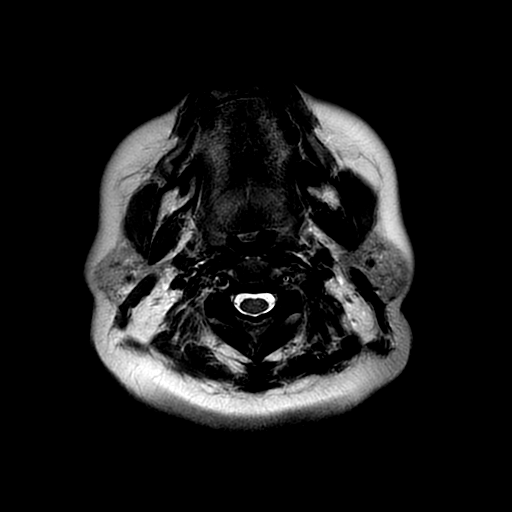

[Series 8: GRE · axial · 5.0mm · 0.47mm/px · z∈[-95,+67]mm · 2 of 28 slices shown]
[im 1/28]
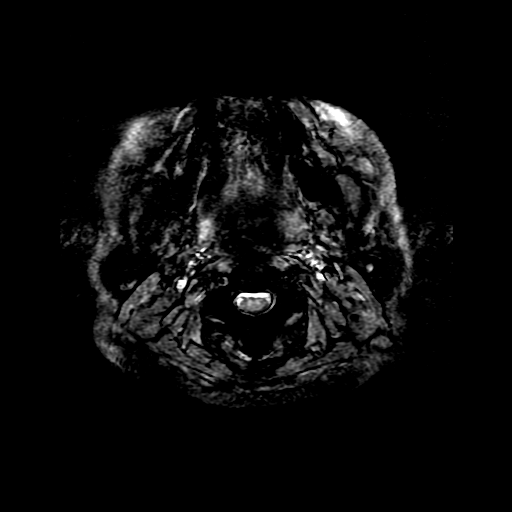
[im 28/28]
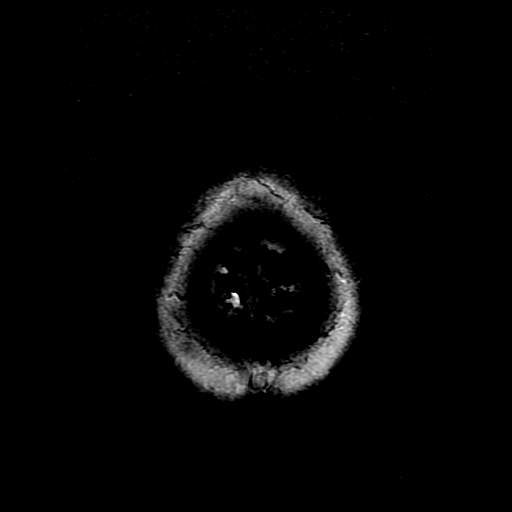

[Series 10: T2 post-contrast · coronal · 3.0mm · 0.39mm/px · 1 of 15 slices shown (1 of 2)]
[im 1/15]
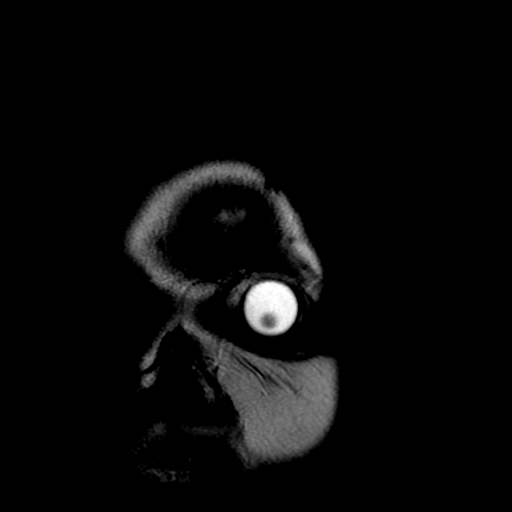

[Series 13: T2 post-contrast · coronal · 3.0mm · 0.39mm/px · 3 of 43 slices shown (2 of 2)]
[im 1/43]
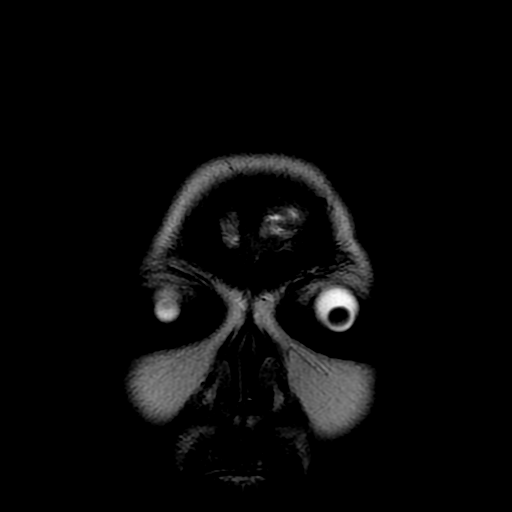
[im 22/43]
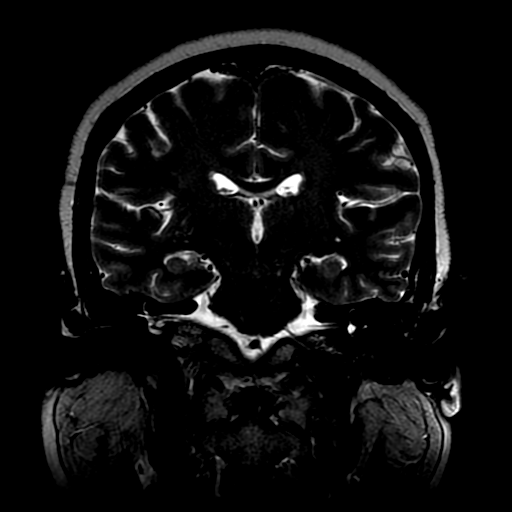
[im 43/43]
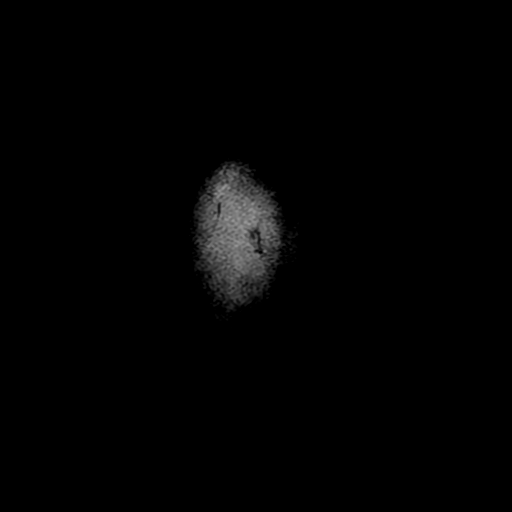

[Series 14: T1 · coronal · 3.0mm · 0.39mm/px · 1 of 43 slices shown]
[im 1/43]
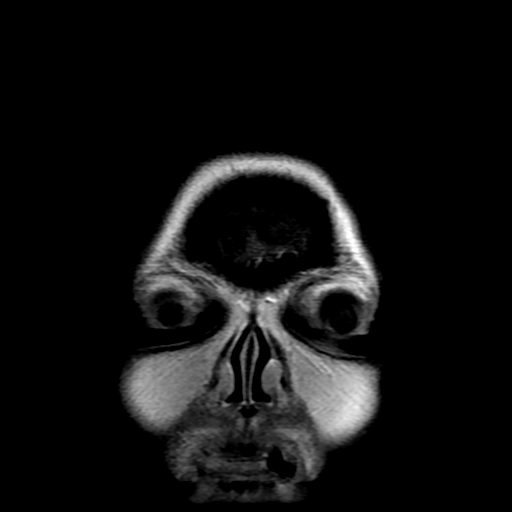

[Series 15: FLAIR · sagittal · 3.0mm · 0.47mm/px · 3 of 43 slices shown (3 of 3)]
[im 1/43]
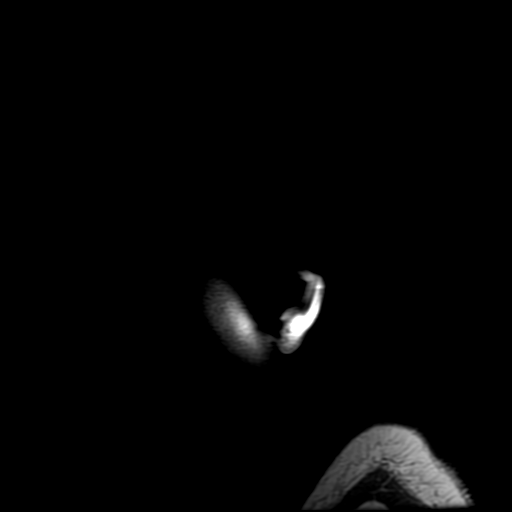
[im 22/43]
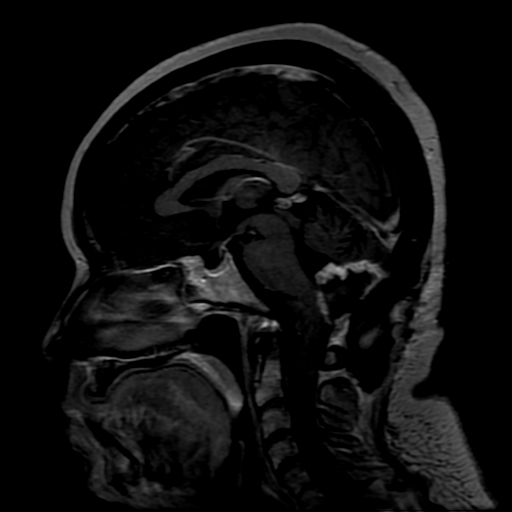
[im 43/43]
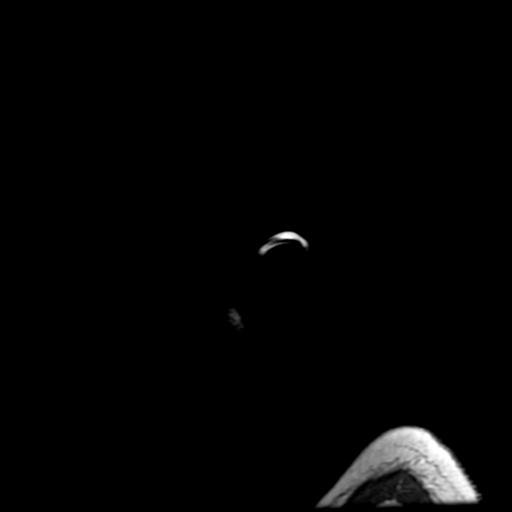

[Series 450: ADC · axial · 3.0mm · 0.94mm/px · z∈[-79,+67]mm · 3 of 50 slices shown]
[im 1/50]
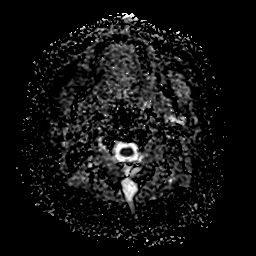
[im 25/50]
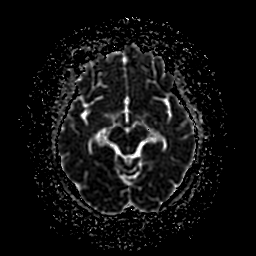
[im 50/50]
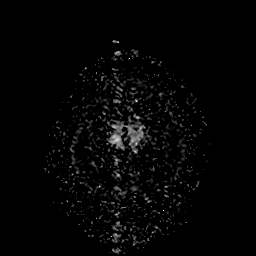

[24 of 48 positions shown; findings below may reference images not displayed]

FINDINGS: Brain: Postop suboccipital craniectomy for tumor resection in the
inferior vermis of the cerebellum. Irregular enhancement is seen
surrounding the surgical cavity. Review of the prior postop study
shows diffuse restricted diffusion in this area compatible with a
acute perioperative infarction. This area is now enhancing. Based on
the current study, it is not possible differentiate residual tumor
from enhancing infarct. Review of the immediately postoperative MRI
is revealing which demonstrate enhancement of the right lateral wall
of the surgical cavity consistent with residual tumor. Interval
improvement in cytotoxic edema around the surgical cavity compared
with the perioperative MRI. No mass-effect on the fourth ventricle.
Negative for obstructive hydrocephalus. 5 mm fat globule in the left
frontal horn, also noted on the CT head of 12/03/2016.

No other metastatic deposits in the brain.

Suboccipital pseudomeningocele 22 x 42 mm.

Vascular: Normal arterial flow voids

Skull and upper cervical spine: Postsurgical changes of suboccipital
craniectomy. Negative for skeletal metastatic disease.

Sinuses/Orbits: Negative

Other: None
IMPRESSION: Postop suboccipital craniectomy for posterior fossa tumor resection
in the vermis. Irregular enhancement surrounding the surgical cavity
consistent with perioperative infarction enhancement. On today's
study, is not possible to differentiate residual tumor from
enhancing infarction however the immediate postoperative study does
reveal enhancing tumor in the right side of the surgical cavity.
Decrease mass-effect on the fourth ventricle. No hydrocephalus.

No other metastatic disease identified in the brain.

Small fat globule left frontal horn

22 x 42 mm suboccipital pseudomeningocele.

## 2018-04-08 ENCOUNTER — Telehealth: Payer: Self-pay

## 2018-04-08 ENCOUNTER — Other Ambulatory Visit: Payer: Self-pay | Admitting: Hematology and Oncology

## 2018-04-08 DIAGNOSIS — Z1231 Encounter for screening mammogram for malignant neoplasm of breast: Secondary | ICD-10-CM

## 2018-04-08 NOTE — Telephone Encounter (Signed)
Pt called to notify Dr.Gudena that she has now obtained a new insurance policy and would like to schedule an appt with MD. Pt would like to know if her new insurance would cover her visits at the cancer center. Told pt that will let her know if this was covered. Pt has [employer] [place]. Pt will wait until confirmation of coverage prior to setting up an appt. Will call pt back with updates. Message sent to manage care for insurance verification

## 2018-04-11 ENCOUNTER — Encounter: Payer: Self-pay | Admitting: Family

## 2018-04-11 ENCOUNTER — Ambulatory Visit (INDEPENDENT_AMBULATORY_CARE_PROVIDER_SITE_OTHER): Payer: BLUE CROSS/BLUE SHIELD | Admitting: Family

## 2018-04-11 ENCOUNTER — Ambulatory Visit
Admission: RE | Admit: 2018-04-11 | Discharge: 2018-04-11 | Disposition: A | Discharge status: Home / Self Care | Payer: BLUE CROSS/BLUE SHIELD | Source: Ambulatory Visit | Attending: Hematology and Oncology | Admitting: Hematology and Oncology

## 2018-04-11 VITALS — BP 138/88 | HR 88 | Temp 97.9°F | Ht 64.0 in | Wt 159.4 lb

## 2018-04-11 DIAGNOSIS — Z1231 Encounter for screening mammogram for malignant neoplasm of breast: Secondary | ICD-10-CM

## 2018-04-11 DIAGNOSIS — E782 Mixed hyperlipidemia: Secondary | ICD-10-CM | POA: Diagnosis not present

## 2018-04-11 DIAGNOSIS — I10 Essential (primary) hypertension: Secondary | ICD-10-CM

## 2018-04-11 MED ORDER — AMLODIPINE BESYLATE 10 MG PO TABS: 10.0000 mg | ORAL_TABLET | Freq: Every day | ORAL | 3 refills | Status: DC

## 2018-04-11 MED ORDER — PRAVASTATIN SODIUM 40 MG PO TABS: 40.0000 mg | ORAL_TABLET | Freq: Every day | ORAL | 3 refills | Status: DC
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
## 2018-04-15 ENCOUNTER — Telehealth: Payer: Self-pay | Admitting: *Deleted

## 2018-04-15 NOTE — Telephone Encounter (Signed)
Medical records faxed to [facility]; RID [id number]

## 2018-05-13 ENCOUNTER — Telehealth: Payer: Self-pay | Admitting: *Deleted

## 2018-05-14 ENCOUNTER — Telehealth: Payer: Self-pay | Admitting: *Deleted

## 2018-05-14 ENCOUNTER — Other Ambulatory Visit: Payer: Self-pay | Admitting: Internal Medicine

## 2018-05-14 ENCOUNTER — Other Ambulatory Visit: Payer: Self-pay

## 2018-05-14 MED ORDER — DEXAMETHASONE 1 MG PO TABS: 1.0000 mg | ORAL_TABLET | Freq: Every day | ORAL | 5 refills | Status: DC

## 2018-05-14 MED ORDER — RIVAROXABAN 20 MG PO TABS: ORAL_TABLET | ORAL | 0 refills | Status: DC

## 2018-05-14 NOTE — Telephone Encounter (Signed)
Ms Howerton called to say she is waiting for her ex-husband's insurance to take effect before she can schedule appts with Dr Mickeal Skinner and [name]. Her ex-husband still carries her on insurance and did not tell her that he had changed jobs.  She will call early March to schedule appts.  In the meantime she needs refills sent to [facility] on [address]:  Xarelto ([name])  Decadron (Dr Mickeal Skinner)

## 2018-05-14 NOTE — Telephone Encounter (Signed)
Will send requested refill for xarelto.

## 2018-05-23 ENCOUNTER — Other Ambulatory Visit: Payer: Self-pay | Admitting: *Deleted

## 2018-05-23 DIAGNOSIS — C7931 Secondary malignant neoplasm of brain: Secondary | ICD-10-CM

## 2018-07-14 ENCOUNTER — Other Ambulatory Visit: Payer: Self-pay | Admitting: Hematology and Oncology

## 2018-08-14 ENCOUNTER — Encounter: Payer: Self-pay | Admitting: Internal Medicine

## 2018-08-18 ENCOUNTER — Telehealth: Payer: Self-pay | Admitting: *Deleted

## 2018-08-18 NOTE — Telephone Encounter (Signed)
Received patient my chart message.  Attempted to call patient to clarify question further.  LM, pending returned call.

## 2018-09-16 ENCOUNTER — Other Ambulatory Visit: Payer: Self-pay

## 2018-09-16 MED ORDER — RIVAROXABAN 20 MG PO TABS: ORAL_TABLET | ORAL | 0 refills | Status: DC

## 2018-10-27 ENCOUNTER — Other Ambulatory Visit: Payer: Self-pay | Admitting: Family

## 2018-10-27 MED ORDER — AMLODIPINE BESYLATE 10 MG PO TABS: 10.0000 mg | ORAL_TABLET | Freq: Every day | ORAL | 1 refills | Status: DC

## 2018-10-28 ENCOUNTER — Other Ambulatory Visit: Payer: Self-pay | Admitting: *Deleted

## 2018-10-28 MED ORDER — DEXAMETHASONE 1 MG PO TABS: 1.0000 mg | ORAL_TABLET | Freq: Every day | ORAL | 5 refills | Status: DC

## 2018-11-14 ENCOUNTER — Other Ambulatory Visit: Payer: Self-pay | Admitting: Hematology and Oncology

## 2018-11-24 ENCOUNTER — Encounter: Payer: Self-pay | Admitting: Internal Medicine

## 2018-11-26 ENCOUNTER — Encounter: Payer: Self-pay | Admitting: Hematology and Oncology

## 2018-12-13 ENCOUNTER — Encounter: Payer: Self-pay | Admitting: Family

## 2018-12-31 ENCOUNTER — Other Ambulatory Visit: Payer: Self-pay | Admitting: Hematology and Oncology

## 2019-01-09 ENCOUNTER — Encounter: Payer: Self-pay | Admitting: Family

## 2019-01-12 ENCOUNTER — Other Ambulatory Visit: Payer: Self-pay | Admitting: Hematology and Oncology

## 2019-01-13 ENCOUNTER — Telehealth: Payer: Self-pay | Admitting: Hematology and Oncology

## 2019-01-13 NOTE — Telephone Encounter (Signed)
Called pt per 10/13 sch message - per pt - no benefits at the moment and does not want to set up appt until she has benefits

## 2019-01-26 ENCOUNTER — Other Ambulatory Visit: Payer: Self-pay | Admitting: Family

## 2019-02-07 ENCOUNTER — Encounter: Payer: Self-pay | Admitting: Hematology and Oncology

## 2019-02-07 ENCOUNTER — Encounter: Payer: Self-pay | Admitting: Family

## 2019-02-09 ENCOUNTER — Telehealth: Payer: Self-pay | Admitting: Hematology and Oncology

## 2019-02-09 NOTE — Telephone Encounter (Signed)
Scheduled appt per 1/19 sch message - pt is aware of appt date and time   

## 2019-02-19 ENCOUNTER — Other Ambulatory Visit: Payer: Self-pay | Admitting: *Deleted

## 2019-02-19 MED ORDER — DEXAMETHASONE 1 MG PO TABS: 1.0000 mg | ORAL_TABLET | Freq: Every day | ORAL | 3 refills | Status: DC

## 2019-03-07 ENCOUNTER — Encounter: Payer: Self-pay | Admitting: Family

## 2019-03-12 ENCOUNTER — Other Ambulatory Visit: Payer: Self-pay | Admitting: *Deleted

## 2019-03-12 MED ORDER — RIVAROXABAN 20 MG PO TABS: ORAL_TABLET | ORAL | 1 refills | Status: DC

## 2019-03-17 ENCOUNTER — Encounter: Payer: Self-pay | Admitting: *Deleted

## 2019-03-17 NOTE — Progress Notes (Signed)
Pt daughter faxed over [place] patient assistance form for Xarelto.  Provider filled out assigned section and faxed back to [phone].  RN also gave pt daughter Pollyann Glen a copy of filled out section and instructed her that the rest of the information needed was the responsibility of the pt to complete and mail or fax in.  Verbalized understanding.

## 2019-03-18 ENCOUNTER — Other Ambulatory Visit: Payer: Self-pay | Admitting: Hematology and Oncology

## 2019-03-18 DIAGNOSIS — Z1231 Encounter for screening mammogram for malignant neoplasm of breast: Secondary | ICD-10-CM

## 2019-03-19 ENCOUNTER — Other Ambulatory Visit: Payer: Self-pay | Admitting: *Deleted

## 2019-03-19 DIAGNOSIS — C7931 Secondary malignant neoplasm of brain: Secondary | ICD-10-CM

## 2019-03-23 ENCOUNTER — Encounter: Payer: Self-pay | Admitting: Hematology and Oncology

## 2019-03-23 NOTE — Progress Notes (Signed)
Approval letter from [employer] placed to be scanned via HIM.  Patient approved for Xarelto for up to one year until 03/18/20 through pharmacy card provided by [employer] to patient.

## 2019-03-28 ENCOUNTER — Other Ambulatory Visit: Payer: Self-pay | Admitting: Hematology and Oncology

## 2019-04-25 ENCOUNTER — Other Ambulatory Visit: Payer: Self-pay | Admitting: Family

## 2019-05-12 ENCOUNTER — Ambulatory Visit: Payer: BLUE CROSS/BLUE SHIELD | Admitting: Hematology and Oncology

## 2019-05-19 ENCOUNTER — Ambulatory Visit: Payer: BLUE CROSS/BLUE SHIELD

## 2019-05-19 NOTE — Progress Notes (Signed)
Patient Care Team: [name], [place] as PCP - General (Internal Medicine)  DIAGNOSIS:    ICD-10-CM   1. Brain metastasis ([place])  C79.31   2. Metastatic breast cancer ([place])  C50.919     SUMMARY OF ONCOLOGIC HISTORY: Oncology History  Primary cancer of upper outer quadrant of right female breast ([place])  04/16/2012 Initial Biopsy   Right breast mass biopsy invasive ductal carcinoma with abundant mucin in all the 3 masses, right axillary lymph node biopsy positive for IDC, ER 100% PR percent Ki-67 19%, HER-2 negative ratio 0.94   04/21/2012 Breast MRI   Right breast 3 masses 11o'clock position 1.8 cm, now 11:00 position 2 cm, and 10:00 position 2.3 cm with an abnormal level I right axillary lymph node 3 cm   05/20/2012 Surgery   Right breast mastectomy invasive ductal carcinoma grade 2; 1.9 cm, 2.5 cm, 1.8 cm, 1/17 LN positive, second and third massive grade 3   06/24/2012 - 10/07/2012 Chemotherapy   Adjuvant chemotherapy with Taxotere Cytoxan x6 cycles   10/16/2012 - 12/16/2012 Radiation Therapy   Adjuvant radiation therapy by Dr. [name]   01/07/2013 -  Anti-estrogen oral therapy   Tamoxifen 20 mg daily   11/19/2016 Imaging   MRI brain: 2.9 similar mass within the inferior vermis with surrounding edema and local mass effect partially effacing the fourth ventricle.    11/21/2016 Relapse/Recurrence   Brain biopsy: Metastatic ductal carcinoma the breast positive for ER PR, GATA 3, negative for PAX8 and TTF-1   11/21/2016 Surgery   Suboccipital craniotomy for gross total resection of cerebellar tumor by Dr. [name]     CHIEF COMPLIANT: Follow-up of metastatic breast cancer and bilateral PEs  INTERVAL HISTORY: Tasha Ortiz is a 64 y.o. with above-mentioned history of metastatic breast cancer with brain metastases. She is followed by Dr. Mickeal Skinner. She also has a history of bilateral PEs for which she is currently on Xarelto. I last saw her over two years ago. She presents  to the clinic today for follow-up.  She has not had any systemic staging evaluation for the past couple of years.  She informed me that she has continued to take antiestrogen therapy. She had lost insurance for a couple of years and finally got her insurance back.  She is ready to resume her oncology care.  She tells me that she has fungal infection in her groin bilaterally.  She is here in a wheelchair brought in by her daughter.  ALLERGIES:  is allergic to lisinopril.  MEDICATIONS:  Current Outpatient Medications  Medication Sig Dispense Refill   acetaminophen (TYLENOL) 325 MG tablet Take 650 mg by mouth every 6 (six) hours as needed for moderate pain.     amLODipine (NORVASC) 10 MG tablet TAKE 1 TABLET BY MOUTH EVERY DAY 30 tablet 1   anastrozole (ARIMIDEX) 1 MG tablet TAKE 1 TABLET BY MOUTH EVERY DAY 90 tablet 0   dexamethasone (DECADRON) 1 MG tablet Take 1 tablet (1 mg total) by mouth daily. 30 tablet 3   fluticasone (FLONASE) 50 MCG/ACT nasal spray Place 2 sprays into both nostrils daily. 16 g 3   polyethylene glycol (MIRALAX / GLYCOLAX) packet Take 17 g by mouth.     pravastatin (PRAVACHOL) 40 MG tablet TAKE 1 TABLET BY MOUTH EVERY DAY 30 tablet 1   rivaroxaban (XARELTO) 20 MG TABS tablet TAKE 1 TABLET BY MOUTH DAILY WITH SUPPER 60 tablet 1   No current facility-administered medications for this visit.  PHYSICAL EXAMINATION: ECOG PERFORMANCE STATUS: 1 - Symptomatic but completely ambulatory  There were no vitals filed for this visit. There were no vitals filed for this visit.  LABORATORY DATA:  I have reviewed the data as listed CMP Latest Ref Rng & Units 04/09/2017 12/31/2016 12/03/2016  Glucose 65 - 99 mg/dL - 180(H) 196(H)  BUN 6 - 20 mg/dL - 16 13  Creatinine 0.44 - 1.00 mg/dL 0.70 0.70 0.94  Sodium 135 - 145 mmol/L - 137 134(L)  Potassium 3.5 - 5.1 mmol/L - 3.7 3.7  Chloride 101 - 111 mmol/L - 101 100(L)  CO2 22 - 32 mmol/L - 28 25  Calcium 8.9 - 10.3 mg/dL - 9.1  8.7(L)  Total Protein 6.5 - 8.1 g/dL - 6.5 -  Total Bilirubin 0.3 - 1.2 mg/dL - 0.6 -  Alkaline Phos 38 - 126 U/L - 38 -  AST 15 - 41 U/L - 18 -  ALT 14 - 54 U/L - 17 -    Lab Results  Component Value Date   WBC 5.6 12/31/2016   HGB 12.7 12/31/2016   HCT 37.4 12/31/2016   MCV 86.0 12/31/2016   PLT 120 (L) 12/31/2016   NEUTROABS 4.6 12/31/2016    ASSESSMENT & PLAN:  Metastatic breast cancer ([place]) Metastatic breast cancer with brain metastases: Cerebellar lesion 2.9 cm in size status post gross total resection and Dr. [name] on 11/21/2016  (Right breast invasive ductal carcinoma multifocal disease ER/PR positive HER-2 negative T2, N1, M0 stage IIB status post mastectomy, adjuvant chemotherapy and radiation now on antiestrogen therapy with tamoxifen since October 2014.)  Current treatment: Letrozole Letrozole toxicities: Denies any hot flashes or myalgias  I have not seen her in a long time.  We need to do restaging scans. MRI brain has been scheduled for 05/23/2019 MRI brain 12/23/2017: Progression of bilateral lower cerebellar enhancement  Fungal infection in the groin bilaterally: Sent a prescription for Diflucan. Instructed her to eat less sugar. Return to clinic after scans to discuss results with a MyChart virtual visit.    No orders of the defined types were placed in this encounter.  The patient has a good understanding of the overall plan. she agrees with it. she will call with any problems that may develop before the next visit here.  Total time spent: 30 mins including face to face time and time spent for planning, charting and coordination of care  Nicholas Lose, MD 05/20/2019  I, Cloyde Reams Dorshimer, am acting as scribe for Dr. Nicholas Lose.  I have reviewed the above documentation for accuracy and completeness, and I agree with the above.

## 2019-05-20 ENCOUNTER — Other Ambulatory Visit: Payer: Self-pay

## 2019-05-20 ENCOUNTER — Inpatient Hospital Stay: Payer: Medicare HMO | Attending: Hematology and Oncology | Admitting: Hematology and Oncology

## 2019-05-20 ENCOUNTER — Inpatient Hospital Stay: Payer: Medicare HMO

## 2019-05-20 VITALS — BP 152/93 | HR 86 | Temp 98.0°F | Resp 16 | Ht 64.0 in | Wt 153.0 lb

## 2019-05-20 DIAGNOSIS — C50919 Malignant neoplasm of unspecified site of unspecified female breast: Secondary | ICD-10-CM

## 2019-05-20 DIAGNOSIS — Z86711 Personal history of pulmonary embolism: Secondary | ICD-10-CM | POA: Insufficient documentation

## 2019-05-20 DIAGNOSIS — Z79899 Other long term (current) drug therapy: Secondary | ICD-10-CM | POA: Diagnosis not present

## 2019-05-20 DIAGNOSIS — Z7952 Long term (current) use of systemic steroids: Secondary | ICD-10-CM | POA: Insufficient documentation

## 2019-05-20 DIAGNOSIS — Z9221 Personal history of antineoplastic chemotherapy: Secondary | ICD-10-CM | POA: Diagnosis not present

## 2019-05-20 DIAGNOSIS — C50411 Malignant neoplasm of upper-outer quadrant of right female breast: Secondary | ICD-10-CM | POA: Diagnosis present

## 2019-05-20 DIAGNOSIS — B356 Tinea cruris: Secondary | ICD-10-CM | POA: Diagnosis not present

## 2019-05-20 DIAGNOSIS — Z170 Estrogen receptor positive status [ER+]: Secondary | ICD-10-CM | POA: Insufficient documentation

## 2019-05-20 DIAGNOSIS — Z7901 Long term (current) use of anticoagulants: Secondary | ICD-10-CM | POA: Insufficient documentation

## 2019-05-20 DIAGNOSIS — Z79810 Long term (current) use of selective estrogen receptor modulators (SERMs): Secondary | ICD-10-CM | POA: Insufficient documentation

## 2019-05-20 DIAGNOSIS — Z9011 Acquired absence of right breast and nipple: Secondary | ICD-10-CM | POA: Diagnosis not present

## 2019-05-20 DIAGNOSIS — Z923 Personal history of irradiation: Secondary | ICD-10-CM | POA: Insufficient documentation

## 2019-05-20 DIAGNOSIS — C7931 Secondary malignant neoplasm of brain: Secondary | ICD-10-CM

## 2019-05-20 LAB — CMP (CANCER CENTER ONLY)
ALT: 34 U/L (ref 0–44)
AST: 19 U/L (ref 15–41)
Albumin: 3.6 g/dL (ref 3.5–5.0)
Alkaline Phosphatase: 86 U/L (ref 38–126)
Anion gap: 15 (ref 5–15)
BUN: 10 mg/dL (ref 8–23)
CO2: 27 mmol/L (ref 22–32)
Calcium: 9.2 mg/dL (ref 8.9–10.3)
Chloride: 103 mmol/L (ref 98–111)
Creatinine: 0.91 mg/dL (ref 0.44–1.00)
GFR, Est AFR Am: >60 mL/min (ref >60)
GFR, Estimated: >60 mL/min (ref >60)
Glucose, Bld: 365 mg/dL — ABNORMAL HIGH (ref 70–99)
Potassium: 3.3 mmol/L — ABNORMAL LOW (ref 3.5–5.1)
Sodium: 145 mmol/L (ref 135–145)
Total Bilirubin: 0.4 mg/dL (ref 0.3–1.2)
Total Protein: 7.3 g/dL (ref 6.5–8.1)

## 2019-05-20 LAB — CBC WITH DIFFERENTIAL (CANCER CENTER ONLY)
Abs Immature Granulocytes: 0.05 10*3/uL (ref 0.00–0.07)
Basophils Absolute: 0 10*3/uL (ref 0.0–0.1)
Basophils Relative: 0 %
Eosinophils Absolute: 0 10*3/uL (ref 0.0–0.5)
Eosinophils Relative: 1 %
HCT: 43.7 % (ref 36.0–46.0)
Hemoglobin: 14.9 g/dL (ref 12.0–15.0)
Immature Granulocytes: 1 %
Lymphocytes Relative: 32 %
Lymphs Abs: 1.7 10*3/uL (ref 0.7–4.0)
MCH: 29.7 pg (ref 26.0–34.0)
MCHC: 34.1 g/dL (ref 30.0–36.0)
MCV: 87.2 fL (ref 80.0–100.0)
Monocytes Absolute: 0.5 10*3/uL (ref 0.1–1.0)
Monocytes Relative: 9 %
Neutro Abs: 3 10*3/uL (ref 1.7–7.7)
Neutrophils Relative %: 57 %
Platelet Count: 184 10*3/uL (ref 150–400)
RBC: 5.01 MIL/uL (ref 3.87–5.11)
RDW: 12.7 % (ref 11.5–15.5)
WBC Count: 5.2 10*3/uL (ref 4.0–10.5)
nRBC: 0 % (ref 0.0–0.2)

## 2019-05-20 MED ORDER — FLUCONAZOLE 100 MG PO TABS: 100.0000 mg | ORAL_TABLET | Freq: Every day | ORAL | 1 refills | Status: DC

## 2019-05-20 MED ORDER — ANASTROZOLE 1 MG PO TABS: 1.0000 mg | ORAL_TABLET | Freq: Every day | ORAL | 3 refills | Status: DC

## 2019-05-20 NOTE — Assessment & Plan Note (Signed)
Metastatic breast cancer with brain metastases: Cerebellar lesion 2.9 cm in size status post gross total resection and Dr. [name] on 11/21/2016  (Right breast invasive ductal carcinoma multifocal disease ER/PR positive HER-2 negative T2, N1, M0 stage IIB status post mastectomy, adjuvant chemotherapy and radiation now on antiestrogen therapy with tamoxifen since October 2014.)  Current treatment: Letrozole Letrozole toxicities: Denies any hot flashes or myalgias  I have not seen her in a long time.  We need to do restaging scans. MRI brain has been scheduled for 05/23/2019 MRI brain 12/23/2017: Progression of bilateral lower cerebellar enhancement  Return to clinic after scans to discuss results.

## 2019-05-21 ENCOUNTER — Telehealth: Payer: Self-pay | Admitting: Hematology and Oncology

## 2019-05-21 NOTE — Telephone Encounter (Signed)
I talk with patient regarding video visit °

## 2019-05-23 ENCOUNTER — Other Ambulatory Visit: Payer: BLUE CROSS/BLUE SHIELD

## 2019-05-25 ENCOUNTER — Other Ambulatory Visit: Payer: Self-pay | Admitting: Radiation Therapy

## 2019-05-26 ENCOUNTER — Telehealth: Payer: Self-pay

## 2019-05-26 ENCOUNTER — Ambulatory Visit (INDEPENDENT_AMBULATORY_CARE_PROVIDER_SITE_OTHER): Payer: Medicare HMO | Admitting: Family

## 2019-05-26 ENCOUNTER — Encounter: Payer: Self-pay | Admitting: Family

## 2019-05-26 ENCOUNTER — Inpatient Hospital Stay: Payer: Medicare HMO | Admitting: Internal Medicine

## 2019-05-26 ENCOUNTER — Other Ambulatory Visit: Payer: Self-pay

## 2019-05-26 ENCOUNTER — Encounter: Payer: BLUE CROSS/BLUE SHIELD | Admitting: Family

## 2019-05-26 VITALS — BP 138/76 | HR 97 | Temp 98.1°F | Ht 64.0 in | Wt 156.0 lb

## 2019-05-26 DIAGNOSIS — I10 Essential (primary) hypertension: Secondary | ICD-10-CM | POA: Diagnosis not present

## 2019-05-26 DIAGNOSIS — R69 Illness, unspecified: Secondary | ICD-10-CM | POA: Diagnosis not present

## 2019-05-26 DIAGNOSIS — E113213 Type 2 diabetes mellitus with mild nonproliferative diabetic retinopathy with macular edema, bilateral: Secondary | ICD-10-CM | POA: Diagnosis not present

## 2019-05-26 DIAGNOSIS — H5211 Myopia, right eye: Secondary | ICD-10-CM | POA: Diagnosis not present

## 2019-05-26 DIAGNOSIS — E785 Hyperlipidemia, unspecified: Secondary | ICD-10-CM | POA: Diagnosis not present

## 2019-05-26 DIAGNOSIS — R7309 Other abnormal glucose: Secondary | ICD-10-CM | POA: Diagnosis not present

## 2019-05-26 LAB — COMPREHENSIVE METABOLIC PANEL
ALT: 35 U/L (ref 0–35)
AST: 22 U/L (ref 0–37)
Albumin: 4.6 g/dL (ref 3.5–5.2)
Alkaline Phosphatase: 96 U/L (ref 39–117)
BUN: 16 mg/dL (ref 6–23)
CO2: 26 mEq/L (ref 19–32)
Calcium: 10.8 mg/dL — ABNORMAL HIGH (ref 8.4–10.5)
Chloride: 96 mEq/L (ref 96–112)
Creatinine, Ser: 0.8 mg/dL (ref 0.40–1.20)
GFR: 88.07 mL/min (ref >60.00)
Glucose, Bld: 603 mg/dL (ref 70–99)
Potassium: 3.8 mEq/L (ref 3.5–5.1)
Sodium: 134 mEq/L — ABNORMAL LOW (ref 135–145)
Total Bilirubin: 0.5 mg/dL (ref 0.2–1.2)
Total Protein: 8.4 g/dL — ABNORMAL HIGH (ref 6.0–8.3)

## 2019-05-26 LAB — LIPID PANEL
Cholesterol: 268 mg/dL — ABNORMAL HIGH (ref 0–200)
HDL: 69.2 mg/dL (ref >39.00)
LDL Cholesterol: 181 mg/dL — ABNORMAL HIGH (ref 0–99)
NonHDL: 198.86
Total CHOL/HDL Ratio: 4
Triglycerides: 91 mg/dL (ref 0.0–149.0)
VLDL: 18.2 mg/dL (ref 0.0–40.0)

## 2019-05-26 MED ORDER — BLOOD GLUCOSE MONITOR KIT: PACK | 0 refills | Status: AC

## 2019-05-26 MED ORDER — LANTUS SOLOSTAR 100 UNIT/ML ~~LOC~~ SOPN: 20.0000 [IU] | PEN_INJECTOR | Freq: Every day | SUBCUTANEOUS | 1 refills | Status: DC

## 2019-05-26 MED ORDER — INSULIN PEN NEEDLE 32G X 6 MM MISC: 1 refills | Status: DC
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
## 2019-05-26 NOTE — Telephone Encounter (Signed)
[facility] called critical lab value for glucose was 601 the first time and 603 the second time.   Spoke to Dr. Quay Burow about the critical lab value. She advised to start with 20 units of Lantus with the expectation to increase by more than 2 units daily. Message sent to Mickel Baas for additional instructions.   Pt dtr Levada Dy was with her at the appointment today but was not with pt when I called. Spoke to other dtr Elder Love. Both dtr and pt was advised of critical lab and to start with the 20 units of Lantus. Informed they would receive a call back to give additional changes to instructions per Laura's recommendation. Lastly, informed both that if there were any changes to call the office to speak with the on call provider.   Pt and dtr stated understanding.

## 2019-05-27 ENCOUNTER — Other Ambulatory Visit: Payer: Self-pay | Admitting: Family

## 2019-05-27 MED ORDER — BASAGLAR KWIKPEN 100 UNIT/ML ~~LOC~~ SOPN: 20.0000 [IU] | PEN_INJECTOR | Freq: Every day | SUBCUTANEOUS | 1 refills | Status: DC

## 2019-05-28 ENCOUNTER — Ambulatory Visit: Payer: BLUE CROSS/BLUE SHIELD | Admitting: Internal Medicine

## 2019-05-28 LAB — HEMOGLOBIN A1C: Hgb A1c MFr Bld: 16.2 % — ABNORMAL HIGH (ref 4.6–6.5)

## 2019-05-29 ENCOUNTER — Encounter: Payer: Self-pay | Admitting: *Deleted

## 2019-06-01 DIAGNOSIS — Z20822 Contact with and (suspected) exposure to covid-19: Secondary | ICD-10-CM | POA: Diagnosis not present

## 2019-06-03 ENCOUNTER — Other Ambulatory Visit: Payer: Self-pay

## 2019-06-03 ENCOUNTER — Ambulatory Visit (HOSPITAL_COMMUNITY)
Admission: RE | Admit: 2019-06-03 | Discharge: 2019-06-03 | Disposition: A | Discharge status: Home / Self Care | Payer: Medicare HMO | Source: Ambulatory Visit | Attending: Hematology and Oncology | Admitting: Hematology and Oncology

## 2019-06-03 ENCOUNTER — Telehealth: Payer: Self-pay

## 2019-06-03 ENCOUNTER — Encounter (HOSPITAL_COMMUNITY): Payer: Self-pay

## 2019-06-03 DIAGNOSIS — C7931 Secondary malignant neoplasm of brain: Secondary | ICD-10-CM | POA: Insufficient documentation

## 2019-06-03 DIAGNOSIS — C50919 Malignant neoplasm of unspecified site of unspecified female breast: Secondary | ICD-10-CM | POA: Diagnosis not present

## 2019-06-03 DIAGNOSIS — K760 Fatty (change of) liver, not elsewhere classified: Secondary | ICD-10-CM | POA: Diagnosis not present

## 2019-06-03 MED ORDER — IOHEXOL 300 MG/ML  SOLN
100.0000 mL | Freq: Once | INTRAMUSCULAR | Status: AC | PRN
  Administered 2019-06-03: 100 mL via INTRAVENOUS

## 2019-06-03 MED ORDER — SODIUM CHLORIDE (PF) 0.9 % IJ SOLN
INTRAMUSCULAR | Status: AC
  Filled 2019-06-03: qty 50

## 2019-06-03 NOTE — Telephone Encounter (Signed)
Spoke with pt by phone and gave below msg. Pt states she had a large BM today once she got home from scan. Pt advise to stop miralax if she starts having loose stools.  Pt verbalizes understanding of instructions.

## 2019-06-03 NOTE — Progress Notes (Signed)
HEMATOLOGY-ONCOLOGY MYCHART VIDEO VISIT PROGRESS NOTE  I connected with Tasha Ortiz on 06/04/2019 at  8:30 AM EST by MyChart video conference and verified that I am speaking with the correct person using two identifiers.  I discussed the limitations, risks, security and privacy concerns of performing an evaluation and management service by MyChart and the availability of in person appointments.  I also discussed with the patient that there may be a patient responsible charge related to this service. The patient expressed understanding and agreed to proceed.  Patient's Location: Home Physician Location: Clinic  CHIEF COMPLIANT: Follow-up of metastatic breast cancer and bilateral PEs  INTERVAL HISTORY: Tasha Ortiz is a 64 y.o. female with above-mentioned history of metastatic breast cancer with brain metastases. She is followed by Dr. Vaslow. She also has a history of bilateral PEs for which she is currently on Xarelto. CT CAP on 06/03/19 showed no evidence of metastatic disease. She presents over MyChart today to review her scans.  Oncology History  Primary cancer of upper outer quadrant of right female breast ([facility])  04/16/2012 Initial Biopsy   Right breast mass biopsy invasive ductal carcinoma with abundant mucin in all the 3 masses, right axillary lymph node biopsy positive for IDC, ER 100% PR percent Ki-67 19%, HER-2 negative ratio 0.94   04/21/2012 Breast MRI   Right breast 3 masses 11o'clock position 1.8 cm, now 11:00 position 2 cm, and 10:00 position 2.3 cm with an abnormal level I right axillary lymph node 3 cm   05/20/2012 Surgery   Right breast mastectomy invasive ductal carcinoma grade 2; 1.9 cm, 2.5 cm, 1.8 cm, 1/17 LN positive, second and third massive grade 3   06/24/2012 - 10/07/2012 Chemotherapy   Adjuvant chemotherapy with Taxotere Cytoxan x6 cycles   10/16/2012 - 12/16/2012 Radiation Therapy   Adjuvant radiation therapy by Dr. moody   01/07/2013 -  Anti-estrogen oral  therapy   Tamoxifen 20 mg daily   11/19/2016 Imaging   MRI brain: 2.9 similar mass within the inferior vermis with surrounding edema and local mass effect partially effacing the fourth ventricle.    11/21/2016 Relapse/Recurrence   Brain biopsy: Metastatic ductal carcinoma the breast positive for ER PR, GATA 3, negative for PAX8 and TTF-1   11/21/2016 Surgery   Suboccipital craniotomy for gross total resection of cerebellar tumor by Dr. Jeff Jenkins    Observations/Objective:  There were no vitals filed for this visit. There is no height or weight on file to calculate BMI.  I have reviewed the data as listed CMP Latest Ref Rng & Units 05/26/2019 05/20/2019 04/09/2017  Glucose 70 - 99 mg/dL 603(HH) 365(H) -  BUN 6 - 23 mg/dL 16 10 -  Creatinine 0.40 - 1.20 mg/dL 0.80 0.91 0.70  Sodium 135 - 145 mEq/L 134(L) 145 -  Potassium 3.5 - 5.1 mEq/L 3.8 3.3(L) -  Chloride 96 - 112 mEq/L 96 103 -  CO2 19 - 32 mEq/L 26 27 -  Calcium 8.4 - 10.5 mg/dL 10.8(H) 9.2 -  Total Protein 6.0 - 8.3 g/dL 8.4(H) 7.3 -  Total Bilirubin 0.2 - 1.2 mg/dL 0.5 0.4 -  Alkaline Phos 39 - 117 U/L 96 86 -  AST 0 - 37 U/L 22 19 -  ALT 0 - 35 U/L 35 34 -    Lab Results  Component Value Date   WBC 5.2 05/20/2019   HGB 14.9 05/20/2019   HCT 43.7 05/20/2019   MCV 87.2 05/20/2019   PLT 184 05/20/2019     NEUTROABS 3.0 05/20/2019      Assessment Plan:  Primary cancer of upper outer quadrant of right female breast ([place]) Metastatic breast cancer with brain metastases: Cerebellar lesion 2.9 cm in size status post gross total resection and Dr. [name] on 11/21/2016  (Right breast invasive ductal carcinoma multifocal disease ER/PR positive HER-2 negative T2, N1, M0 stage IIB status post mastectomy, adjuvant chemotherapy and radiation now on antiestrogen therapy with tamoxifen since October 2014.)  Current treatment: Anastrozole Anastrozole toxicities: Denies any hot flashes or myalgias    MRI brain has been  scheduled for 06/17/2019 MRI brain 12/23/2017: Progression of bilateral lower cerebellar enhancement  Fungal infection in the groin bilaterally:  completed Diflucan. Currently on Dexamethasone: encouraged her to cut it in half  CT CAP 06/03/2019: No evidence of metastatic disease.  Hepatic steatosis. There is no evidence of metastatic disease in the chest abdomen pelvis. Balance issues and lack of strength in lower extremities: PT referral for balance  We will continue to monitor her with scans every 12 months and follow up after.     I discussed the assessment and treatment plan with the patient. The patient was provided an opportunity to ask questions and all were answered. The patient agreed with the plan and demonstrated an understanding of the instructions. The patient was advised to call back or seek an in-person evaluation if the symptoms worsen or if the condition fails to improve as anticipated.   I provided 15 minutes of face-to-face MyChart video visit time during this encounter.    Rulon Eisenmenger, MD 06/04/2019   I, Molly Dorshimer, am acting as scribe for Nicholas Lose, MD.  I have reviewed the above documentation for accuracy and completeness, and I agree with the above.

## 2019-06-03 NOTE — Telephone Encounter (Signed)
-----   Message from Gardenia Phlegm, NP sent at 06/03/2019 11:17 AM EST ----- Please call patient.  Scans show NO CANCER :)  She does have a lot of stool in her colon.  She needs to take miralax once or twice and increase her fluid intake to try to have a large BM.   ----- Message ----- From: Interface, Rad Results In Sent: 06/03/2019  10:53 AM EST To: Nicholas Lose, MD

## 2019-06-04 ENCOUNTER — Inpatient Hospital Stay: Payer: Medicare HMO | Attending: Hematology and Oncology | Admitting: Hematology and Oncology

## 2019-06-04 DIAGNOSIS — Z9011 Acquired absence of right breast and nipple: Secondary | ICD-10-CM | POA: Insufficient documentation

## 2019-06-04 DIAGNOSIS — Z170 Estrogen receptor positive status [ER+]: Secondary | ICD-10-CM | POA: Insufficient documentation

## 2019-06-04 DIAGNOSIS — Z8249 Family history of ischemic heart disease and other diseases of the circulatory system: Secondary | ICD-10-CM | POA: Insufficient documentation

## 2019-06-04 DIAGNOSIS — C50411 Malignant neoplasm of upper-outer quadrant of right female breast: Secondary | ICD-10-CM | POA: Diagnosis not present

## 2019-06-04 DIAGNOSIS — Z79899 Other long term (current) drug therapy: Secondary | ICD-10-CM | POA: Insufficient documentation

## 2019-06-04 DIAGNOSIS — Z8349 Family history of other endocrine, nutritional and metabolic diseases: Secondary | ICD-10-CM | POA: Insufficient documentation

## 2019-06-04 DIAGNOSIS — Z833 Family history of diabetes mellitus: Secondary | ICD-10-CM | POA: Insufficient documentation

## 2019-06-04 DIAGNOSIS — C7931 Secondary malignant neoplasm of brain: Secondary | ICD-10-CM | POA: Insufficient documentation

## 2019-06-04 DIAGNOSIS — Z86711 Personal history of pulmonary embolism: Secondary | ICD-10-CM | POA: Insufficient documentation

## 2019-06-04 DIAGNOSIS — Z923 Personal history of irradiation: Secondary | ICD-10-CM | POA: Insufficient documentation

## 2019-06-04 DIAGNOSIS — Z803 Family history of malignant neoplasm of breast: Secondary | ICD-10-CM | POA: Insufficient documentation

## 2019-06-04 DIAGNOSIS — Z794 Long term (current) use of insulin: Secondary | ICD-10-CM | POA: Insufficient documentation

## 2019-06-04 DIAGNOSIS — Z9221 Personal history of antineoplastic chemotherapy: Secondary | ICD-10-CM | POA: Insufficient documentation

## 2019-06-04 DIAGNOSIS — Z7901 Long term (current) use of anticoagulants: Secondary | ICD-10-CM | POA: Insufficient documentation

## 2019-06-04 MED ORDER — RIVAROXABAN 20 MG PO TABS: ORAL_TABLET | ORAL | 3 refills | Status: DC

## 2019-06-04 NOTE — Assessment & Plan Note (Signed)
Metastatic breast cancer with brain metastases: Cerebellar lesion 2.9 cm in size status post gross total resection and Dr. Jenkins on 11/21/2016  (Right breast invasive ductal carcinoma multifocal disease ER/PR positive HER-2 negative T2, N1, M0 stage IIB status post mastectomy, adjuvant chemotherapy and radiation now on antiestrogen therapy with tamoxifen since October 2014.)  Current treatment: Letrozole Letrozole toxicities: Denies any hot flashes or myalgias    MRI brain has been scheduled for 06/17/2019 MRI brain 12/23/2017: Progression of bilateral lower cerebellar enhancement  Fungal infection in the groin bilaterally:   Diflucan. CT CAP 06/03/2019: No evidence of metastatic disease.  Hepatic steatosis. There is no evidence of metastatic disease in the chest abdomen pelvis.  We will continue to monitor her with scans every 6 months. 

## 2019-06-05 ENCOUNTER — Telehealth: Payer: Self-pay | Admitting: Hematology and Oncology

## 2019-06-05 NOTE — Telephone Encounter (Signed)
I talk with patient regarding schedule  

## 2019-06-08 ENCOUNTER — Other Ambulatory Visit: Payer: Self-pay | Admitting: Hematology and Oncology

## 2019-06-15 ENCOUNTER — Other Ambulatory Visit: Payer: Medicare HMO

## 2019-06-17 ENCOUNTER — Ambulatory Visit
Admission: RE | Admit: 2019-06-17 | Discharge: 2019-06-17 | Disposition: A | Discharge status: Home / Self Care | Payer: Medicare HMO | Source: Ambulatory Visit | Attending: Internal Medicine | Admitting: Internal Medicine

## 2019-06-17 DIAGNOSIS — H34832 Tributary (branch) retinal vein occlusion, left eye, with macular edema: Secondary | ICD-10-CM | POA: Diagnosis not present

## 2019-06-17 DIAGNOSIS — H25043 Posterior subcapsular polar age-related cataract, bilateral: Secondary | ICD-10-CM | POA: Diagnosis not present

## 2019-06-17 DIAGNOSIS — G9389 Other specified disorders of brain: Secondary | ICD-10-CM | POA: Diagnosis not present

## 2019-06-17 DIAGNOSIS — H34811 Central retinal vein occlusion, right eye, with macular edema: Secondary | ICD-10-CM | POA: Diagnosis not present

## 2019-06-17 DIAGNOSIS — H3563 Retinal hemorrhage, bilateral: Secondary | ICD-10-CM | POA: Diagnosis not present

## 2019-06-17 DIAGNOSIS — C7931 Secondary malignant neoplasm of brain: Secondary | ICD-10-CM

## 2019-06-17 MED ORDER — GADOBENATE DIMEGLUMINE 529 MG/ML IV SOLN
14.0000 mL | Freq: Once | INTRAVENOUS | Status: AC | PRN
  Administered 2019-06-17: 14 mL via INTRAVENOUS

## 2019-06-19 ENCOUNTER — Telehealth: Payer: Self-pay | Admitting: *Deleted

## 2019-06-19 NOTE — Telephone Encounter (Signed)
Received call from patient seeking to confirm appt date and time for her appt with Dr. Mickeal Skinner on 06/23/19. Advised of date and time. No further questions or concerns

## 2019-06-22 ENCOUNTER — Inpatient Hospital Stay: Payer: Medicare HMO

## 2019-06-23 ENCOUNTER — Other Ambulatory Visit: Payer: Self-pay

## 2019-06-23 ENCOUNTER — Inpatient Hospital Stay (HOSPITAL_BASED_OUTPATIENT_CLINIC_OR_DEPARTMENT_OTHER): Payer: Medicare HMO | Admitting: Internal Medicine

## 2019-06-23 VITALS — BP 138/89 | HR 91 | Temp 97.8°F | Resp 18 | Ht 64.0 in | Wt 159.2 lb

## 2019-06-23 DIAGNOSIS — C7931 Secondary malignant neoplasm of brain: Secondary | ICD-10-CM

## 2019-06-23 DIAGNOSIS — Z803 Family history of malignant neoplasm of breast: Secondary | ICD-10-CM | POA: Diagnosis not present

## 2019-06-23 DIAGNOSIS — Z9221 Personal history of antineoplastic chemotherapy: Secondary | ICD-10-CM | POA: Diagnosis not present

## 2019-06-23 DIAGNOSIS — Z8249 Family history of ischemic heart disease and other diseases of the circulatory system: Secondary | ICD-10-CM | POA: Diagnosis not present

## 2019-06-23 DIAGNOSIS — Z923 Personal history of irradiation: Secondary | ICD-10-CM | POA: Diagnosis not present

## 2019-06-23 DIAGNOSIS — Z170 Estrogen receptor positive status [ER+]: Secondary | ICD-10-CM | POA: Diagnosis not present

## 2019-06-23 DIAGNOSIS — Z86711 Personal history of pulmonary embolism: Secondary | ICD-10-CM | POA: Diagnosis not present

## 2019-06-23 DIAGNOSIS — C50411 Malignant neoplasm of upper-outer quadrant of right female breast: Secondary | ICD-10-CM | POA: Diagnosis not present

## 2019-06-23 DIAGNOSIS — Z9011 Acquired absence of right breast and nipple: Secondary | ICD-10-CM | POA: Diagnosis not present

## 2019-06-23 DIAGNOSIS — Z794 Long term (current) use of insulin: Secondary | ICD-10-CM | POA: Diagnosis not present

## 2019-06-23 DIAGNOSIS — Z79899 Other long term (current) drug therapy: Secondary | ICD-10-CM | POA: Diagnosis not present

## 2019-06-23 DIAGNOSIS — Z7901 Long term (current) use of anticoagulants: Secondary | ICD-10-CM | POA: Diagnosis not present

## 2019-06-23 DIAGNOSIS — Z833 Family history of diabetes mellitus: Secondary | ICD-10-CM | POA: Diagnosis not present

## 2019-06-23 DIAGNOSIS — Z8349 Family history of other endocrine, nutritional and metabolic diseases: Secondary | ICD-10-CM | POA: Diagnosis not present

## 2019-06-23 NOTE — Progress Notes (Signed)
[facility] [address] [place], [place] [address] [phone]   Interval Evaluation  Date of Service: 06/23/19 Patient Name: Tasha Ortiz Patient MRN: [id number] Patient DOB: 06/12/1955 Provider: Ventura Sellers, MD  Identifying Statement:  Tasha Ortiz is a 64 y.o. female with Brain metastasis ([place]) [C79.31]   Primary Cancer: Breast ER+/PR+/HER-  Oncologic History: Oncology History  Primary cancer of upper outer quadrant of right female breast ([place])  04/16/2012 Initial Biopsy   Right breast mass biopsy invasive ductal carcinoma with abundant mucin in all the 3 masses, right axillary lymph node biopsy positive for IDC, ER 100% PR percent Ki-67 19%, HER-2 negative ratio 0.94   04/21/2012 Breast MRI   Right breast 3 masses 11o'clock position 1.8 cm, now 11:00 position 2 cm, and 10:00 position 2.3 cm with an abnormal level I right axillary lymph node 3 cm   05/20/2012 Surgery   Right breast mastectomy invasive ductal carcinoma grade 2; 1.9 cm, 2.5 cm, 1.8 cm, 1/17 LN positive, second and third massive grade 3   06/24/2012 - 10/07/2012 Chemotherapy   Adjuvant chemotherapy with Taxotere Cytoxan x6 cycles   10/16/2012 - 12/16/2012 Radiation Therapy   Adjuvant radiation therapy by Dr. Lisbeth Ortiz   01/07/2013 -  Anti-estrogen oral therapy   Tamoxifen 20 mg daily   11/19/2016 Imaging   MRI brain: 2.9 similar mass within the inferior vermis with surrounding edema and local mass effect partially effacing the fourth ventricle.    11/21/2016 Relapse/Recurrence   Brain biopsy: Metastatic ductal carcinoma the breast positive for ER PR, GATA 3, negative for PAX8 and TTF-1   11/21/2016 Surgery   Suboccipital craniotomy for gross total resection of cerebellar tumor by Dr. Earle Ortiz     Interval History:  Tasha Ortiz presents today after recent MRI brain.  She describes static deficits, with significant impairment in gait and  coordination as prior.  Currently using a walker at home and wheelchair "going out". She thinks both issues were helped modestly by attention from PT, which she has not had in some time.  No issues with new back pain or urge incontinence.     H+P: presents today to review her CNS disease burden from metastatic breast cancer, and associated neurologic deficits.  She initially described feeling "off balance" this summer, which led to an MRI in August demonstrating large posterior fossa metastasis.  This was resected on 11/21/16, and followed by post-op SRS to the resection cavity.  She describes clinical stability in the post-operative period, which was followed by significant decline during immediately following radiation, described as "inability to ambulate, dress self, feed self" due to poor balance and coordination.  These deficits have reportedly improved dramatically since that time- she is still reliant on a walker, but all other ADL's she is able to perform independently.  Occasionally she has bouts of vertigo, which are effectively treated with Meclizine.   Most recent MRI in January was stable.  She does acknowledge some neck and back pain recently along midline.  No urinary incontinence.  Dr. Lindi Ortiz had prescribed 29m decadron daily for appetite stimulation, which she says has helped considerably and would like to continue.  Medications: Current Outpatient Medications on File Prior to Visit  Medication Sig Dispense Refill  . amLODipine (NORVASC) 10 MG tablet TAKE 1 TABLET BY MOUTH EVERY DAY 30 tablet 1  . anastrozole (ARIMIDEX) 1 MG tablet Take 1 tablet (1 mg total) by mouth daily. 9[place]  tablet 3  . blood glucose meter kit and supplies KIT Dispense based on patient and insurance preference. Use up to four times daily as directed. (FOR ICD-9 250.00, 250.01). 1 each 0  . Insulin Glargine (BASAGLAR KWIKPEN) 100 UNIT/ML SOPN Inject 0.2 mLs (20 Units total) into the skin at bedtime. 15 mL 1  . Insulin  Pen Needle 32G X 6 MM MISC Inject daily as directed 100 each 1  . pravastatin (PRAVACHOL) 40 MG tablet TAKE 1 TABLET BY MOUTH EVERY DAY 30 tablet 1  . tobramycin (TOBREX) 0.3 % ophthalmic solution Place 1 drop into the left eye 4 (four) times daily. For Cataract.    Tasha Ortiz 20 MG TABS tablet TAKE 1 TABLET BY MOUTH EVERY DAY WITH SUPPER 30 tablet 1  . [DISCONTINUED] Prochlorperazine Maleate (COMPAZINE PO) Take by mouth.     No current facility-administered medications on file prior to visit.    Allergies:  Allergies  Allergen Reactions  . Lisinopril Swelling   Past Medical History:  Past Medical History:  Diagnosis Date  . Acid reflux   . Allergy    seasonal  . Breast cancer ([place])    right,no iv or blood on right sid, chemo and radiation 2014  . Family history of brain cancer   . Family history of breast cancer   . Heartburn   . Hypercholesteremia    taken off chol meds Dec 2012  . Hypertension   . Hypertension   . Personal history of chemotherapy 05/2012   rt breast  . Personal history of radiation therapy 10/2012   rt breast   Past Surgical History:  Past Surgical History:  Procedure Laterality Date  . BREAST BIOPSY Right 04/16/2012  . BREAST SURGERY Right 05/20/12   Rt br mastectomy  . CESAREAN SECTION  25 years ago  . colonoscopy    . COLONOSCOPY WITH PROPOFOL N/A 01/18/2015   Procedure: COLONOSCOPY WITH PROPOFOL;  Surgeon: Tasha Pole, MD;  Location: [facility];  Service: Endoscopy;  Laterality: N/A;  . MASTECTOMY Right 05/2012  . MODIFIED MASTECTOMY Right 05/20/2012   Procedure: MODIFIED MASTECTOMY;  Surgeon: Tasha Jolly, MD;  Location: [facility];  Service: General;  Laterality: Right;  . PORTACATH PLACEMENT Left 05/20/2012   Procedure: INSERTION PORT-A-CATH;  Surgeon: Tasha Jolly, MD;  Location: [facility];  Service: General;  Laterality: Left;  . SUBOCCIPITAL CRANIECTOMY CERVICAL LAMINECTOMY N/A 11/21/2016   Procedure: SUBOCCIPITAL CRANIECTOMY  RESECTION TUMOR;  Surgeon: Tasha Pies, MD;  Location: [facility];  Service: Neurosurgery;  Laterality: N/A;  . TUBAL LIGATION     Social History:  Social History   Socioeconomic History  . Marital status: Legally Separated    Spouse name: Not on file  . Number of children: 2  . Years of education: 47  . Highest education level: Not on file  Occupational History  . Occupation: Pension scheme manager  Tobacco Use  . Smoking status: Never Smoker  . Smokeless tobacco: Former Systems developer    Types: Chew  Substance and Sexual Activity  . Alcohol use: No    Alcohol/week: 0.0 standard drinks  . Drug use: No  . Sexual activity: Never    Comment: menarche 18, P2, no HRT  Other Topics Concern  . Not on file  Social History Narrative   Fun: Theodoro Kos   Denies religious beliefs effecting health care.    Social Determinants of Health   Financial Resource Strain:   . Difficulty of Paying Living Expenses:   Food  Insecurity:   . Worried About Charity fundraiser in the Last Year:   . Arboriculturist in the Last Year:   Transportation Needs:   . Film/video editor (Medical):   Marland Kitchen Lack of Transportation (Non-Medical):   Physical Activity:   . Days of Exercise per Week:   . Minutes of Exercise per Session:   Stress:   . Feeling of Stress :   Social Connections:   . Frequency of Communication with Friends and Family:   . Frequency of Social Gatherings with Friends and Family:   . Attends Religious Services:   . Active Member of Clubs or Organizations:   . Attends Archivist Meetings:   Marland Kitchen Marital Status:   Intimate Partner Violence:   . Fear of Current or Ex-Partner:   . Emotionally Abused:   Marland Kitchen Physically Abused:   . Sexually Abused:    Family History:  Family History  Problem Relation Age of Onset  . Hypertension Mother   . Aneurysm Mother 83  . Brain cancer Sister 65  . Breast cancer Cousin 17  . Jaundice Father 87  . Diabetes Daughter   . Breast cancer Cousin 74  . Breast  cancer Cousin 56  . Colon cancer Neg Hx     Review of Systems: Constitutional: Denies fevers, chills or abnormal weight loss Eyes: blurry distance vision Ears, nose, mouth, throat, and face: Denies mucositis or sore throat Respiratory: Denies cough, dyspnea or wheezes Cardiovascular: Denies palpitation, chest discomfort or lower extremity swelling Gastrointestinal:  Denies nausea, constipation, diarrhea GU: Denies dysuria or incontinence Skin: Denies abnormal skin rashes Neurological: Per HPI Musculoskeletal: midline back pain Behavioral/Psych: Denies anxiety, disturbance in thought content, and mood instability   Physical Exam: Vitals:   06/23/19 0910  BP: 138/89  Pulse: 91  Resp: 18  Temp: 97.8 F (36.6 C)  SpO2: 100%   KPS: 70. General: Alert, cooperative, pleasant, in no acute distress.  In wheelchair Head: Craniotomy scar noted, dry and intact. EENT: No conjunctival injection or scleral icterus. Oral mucosa moist Lungs: Resp effort normal Cardiac: Regular rate and rhythm Abdomen: Soft, non-distended abdomen Skin: No rashes cyanosis or petechiae. Extremities: No clubbing or edema  Neurologic Exam: Mental Status: Awake, alert, attentive to examiner. Oriented to self and environment. Language is fluent with intact comprehension.  Cranial Nerves: Visual acuity is grossly normal. Visual fields are full. Extra-ocular movements intact. No ptosis. Face is symmetric, tongue midline. Motor: Tone and bulk are normal. Power is full in both arms and legs. Hyperreflexia noted in bilateral legs (3+) with sustained clonus in right > left ankle.  Arm reflexes normal. Mild dysmetria noted on bilateral finger to nose. Sensory: Intact to light touch and temperature Gait: Non-ambulatory   Labs: I have reviewed the data as listed    Component Value Date/Time   NA 134 (L) 05/26/2019 1314   NA 143 07/05/2014 1101   K 3.8 05/26/2019 1314   K 3.7 07/05/2014 1101   CL 96 05/26/2019  1314   CL 103 09/23/2012 1040   CO2 26 05/26/2019 1314   CO2 27 07/05/2014 1101   GLUCOSE 603 (HH) 05/26/2019 1314   GLUCOSE 113 07/05/2014 1101   GLUCOSE 140 (H) 09/23/2012 1040   BUN 16 05/26/2019 1314   BUN 11.8 07/05/2014 1101   CREATININE 0.80 05/26/2019 1314   CREATININE 0.91 05/20/2019 0846   CREATININE 0.8 07/05/2014 1101   CALCIUM 10.8 (H) 05/26/2019 1314   CALCIUM 9.4 07/05/2014  1101   PROT 8.4 (H) 05/26/2019 1314   PROT 6.6 07/05/2014 1101   ALBUMIN 4.6 05/26/2019 1314   ALBUMIN 3.4 (L) 07/05/2014 1101   AST 22 05/26/2019 1314   AST 19 05/20/2019 0846   AST 18 07/05/2014 1101   ALT 35 05/26/2019 1314   ALT 34 05/20/2019 0846   ALT 15 07/05/2014 1101   ALKPHOS 96 05/26/2019 1314   ALKPHOS 45 07/05/2014 1101   BILITOT 0.5 05/26/2019 1314   BILITOT 0.4 05/20/2019 0846   BILITOT 0.21 07/05/2014 1101   GFRNONAA >60 05/20/2019 0846   GFRAA >60 05/20/2019 0846   Lab Results  Component Value Date   WBC 5.2 05/20/2019   NEUTROABS 3.0 05/20/2019   HGB 14.9 05/20/2019   HCT 43.7 05/20/2019   MCV 87.2 05/20/2019   PLT 184 05/20/2019    Imaging:  [place] Clinician Interpretation: I have personally reviewed the CNS images as listed.  My interpretation, in the context of the patient's clinical presentation, is stable disease  CT Chest W Contrast  Result Date: 06/03/2019 CLINICAL DATA:  Metastatic breast cancer staging EXAM: CT CHEST, ABDOMEN, AND PELVIS WITH CONTRAST TECHNIQUE: Multidetector CT imaging of the chest, abdomen and pelvis was performed following the standard protocol during bolus administration of intravenous contrast. CONTRAST:  192m OMNIPAQUE IOHEXOL 300 MG/ML SOLN, additional oral enteric contrast COMPARISON:  04/01/2017 FINDINGS: CT CHEST FINDINGS Cardiovascular: Left chest port catheter. Previously seen pulmonary embolism is resolved on this non tailored examination. Normal heart size. No pericardial effusion. Scattered aortic atherosclerosis.  Mediastinum/Nodes: No enlarged mediastinal, hilar, or axillary lymph nodes. Thyroid gland, trachea, and esophagus demonstrate no significant findings. Lungs/Pleura: Mild bibasilar scarring. Stable, benign 5 mm nodule of the anterior right upper lobe (series 6, image 69). No pleural effusion or pneumothorax. Musculoskeletal: Status post right mastectomy. No chest wall mass or suspicious bone lesions identified. CT ABDOMEN PELVIS FINDINGS Hepatobiliary: No solid liver abnormality is seen. Hepatic steatosis. No gallstones, gallbladder wall thickening, or biliary dilatation. Pancreas: Unremarkable. No pancreatic ductal dilatation or surrounding inflammatory changes. Spleen: Normal in size without significant abnormality. Adrenals/Urinary Tract: Adrenal glands are unremarkable. Kidneys are normal, without renal calculi, solid lesion, or hydronephrosis. Bladder is unremarkable. Stomach/Bowel: Stomach is within normal limits. Appendix appears normal. No evidence of bowel wall thickening, distention, or inflammatory changes. Large burden of stool and stool balls throughout the colon. Vascular/Lymphatic: Scattered aortic atherosclerosis. No enlarged abdominal or pelvic lymph nodes. Reproductive: No mass or other abnormality. Other: No abdominal wall hernia or abnormality. No abdominopelvic ascites. Musculoskeletal: No acute or significant osseous findings. IMPRESSION: 1. No CT evidence of metastatic disease within the chest, abdomen or pelvis. 2. Hepatic steatosis. 3. Large burden of stool in the colon. 4. Aortic Atherosclerosis (ICD10-I70.0). Electronically Signed   By: AEddie CandleM.D.   On: 06/03/2019 10:51   MR Brain W Wo Contrast  Result Date: 06/17/2019 CLINICAL DATA:  History of breast cancer with resection of a cerebellar [facility] MRI [facility] WITHOUT AND WITH CONTRAST TECHNIQUE: Multiplanar, multiecho pulse sequences of the brain and surrounding structures were obtained without  and with intravenous contrast. CONTRAST:  163mMULTIHANCE GADOBENATE DIMEGLUMINE 529 MG/ML IV SOLN COMPARISON:  12/23/2017 FINDINGS: The study is mildly motion degraded. Brain: Postoperative changes are again seen from suboccipital craniectomy with encephalomalacia, chronic blood products, and mineralization in the cerebellum. There is a similar appearance of intrinsic T1 hyperintensity inferiorly in the cerebellum bilaterally without masslike or suspicious enhancement. T2 hyperintensity inferiorly  in the cerebellum bilaterally is unchanged. A tethered appearance of the posteroinferior cerebellum and posteroinferior medulla is again noted at the craniectomy site. No abnormal intracranial enhancement is identified elsewhere. A chronic microhemorrhage inferiorly in the right temporal lobe is unchanged. No acute infarct, midline shift, or extra-axial fluid collection is identified. Patchy T2 hyperintensities in the cerebral white matter bilaterally are unchanged and nonspecific but compatible with moderate chronic small vessel ischemic disease. There is unchanged chronic enlargement of the fourth ventricle with normal size of the lateral and third ventricles. Vascular: Major intracranial vascular flow voids are preserved. Skull and upper cervical spine: Suboccipital craniectomy with decreased size of overlying fluid collection. No suspicious marrow lesion. Sinuses/Orbits: Unremarkable orbits. Paranasal sinuses and mastoid air cells are clear. Other: None. IMPRESSION: 1. Stable post treatment changes in the posterior fossa without evidence of locally recurrent tumor or new intracranial metastatic disease. 2. Moderate chronic small vessel ischemic disease. Electronically Signed   By: Logan Bores M.D.   On: 06/17/2019 14:19   CT Abdomen Pelvis W Contrast  Result Date: 06/03/2019 CLINICAL DATA:  Metastatic breast cancer staging EXAM: CT CHEST, ABDOMEN, AND PELVIS WITH CONTRAST TECHNIQUE: Multidetector CT imaging of the  chest, abdomen and pelvis was performed following the standard protocol during bolus administration of intravenous contrast. CONTRAST:  164m OMNIPAQUE IOHEXOL 300 MG/ML SOLN, additional oral enteric contrast COMPARISON:  04/01/2017 FINDINGS: CT CHEST FINDINGS Cardiovascular: Left chest port catheter. Previously seen pulmonary embolism is resolved on this non tailored examination. Normal heart size. No pericardial effusion. Scattered aortic atherosclerosis. Mediastinum/Nodes: No enlarged mediastinal, hilar, or axillary lymph nodes. Thyroid gland, trachea, and esophagus demonstrate no significant findings. Lungs/Pleura: Mild bibasilar scarring. Stable, benign 5 mm nodule of the anterior right upper lobe (series 6, image 69). No pleural effusion or pneumothorax. Musculoskeletal: Status post right mastectomy. No chest wall mass or suspicious bone lesions identified. CT ABDOMEN PELVIS FINDINGS Hepatobiliary: No solid liver abnormality is seen. Hepatic steatosis. No gallstones, gallbladder wall thickening, or biliary dilatation. Pancreas: Unremarkable. No pancreatic ductal dilatation or surrounding inflammatory changes. Spleen: Normal in size without significant abnormality. Adrenals/Urinary Tract: Adrenal glands are unremarkable. Kidneys are normal, without renal calculi, solid lesion, or hydronephrosis. Bladder is unremarkable. Stomach/Bowel: Stomach is within normal limits. Appendix appears normal. No evidence of bowel wall thickening, distention, or inflammatory changes. Large burden of stool and stool balls throughout the colon. Vascular/Lymphatic: Scattered aortic atherosclerosis. No enlarged abdominal or pelvic lymph nodes. Reproductive: No mass or other abnormality. Other: No abdominal wall hernia or abnormality. No abdominopelvic ascites. Musculoskeletal: No acute or significant osseous findings. IMPRESSION: 1. No CT evidence of metastatic disease within the chest, abdomen or pelvis. 2. Hepatic steatosis. 3.  Large burden of stool in the colon. 4. Aortic Atherosclerosis (ICD10-I70.0). Electronically Signed   By: AEddie CandleM.D.   On: 06/03/2019 10:51    Assessment/Plan Brain metastasis ([facility]  Ms. MPyonis clinically and radiographically stable today.    Will re-order neuro-focused physical therapy for her, given its prior impact on her balance and coordination.  We appreciate the opportunity to participate in the care of BMila Ortiz  She should return to clinic in 9 months after next MRI brain.  All questions were answered. The patient knows to call the clinic with any problems, questions or concerns. No barriers to learning were detected.  The total time spent in the encounter was 40 minutes and more than 50% was on counseling and review of test results   ZVentura Sellers  MD Medical Director of Neuro-Oncology [facility] at [place] 06/23/19 9:15 AM

## 2019-06-24 ENCOUNTER — Other Ambulatory Visit: Payer: Self-pay | Admitting: Family

## 2019-06-24 ENCOUNTER — Telehealth: Payer: Self-pay | Admitting: Internal Medicine

## 2019-06-24 ENCOUNTER — Other Ambulatory Visit: Payer: Self-pay

## 2019-06-24 ENCOUNTER — Encounter: Payer: Self-pay | Admitting: Family

## 2019-06-24 ENCOUNTER — Ambulatory Visit
Admission: RE | Admit: 2019-06-24 | Discharge: 2019-06-24 | Disposition: A | Discharge status: Home / Self Care | Payer: Medicare HMO | Source: Ambulatory Visit | Attending: Hematology and Oncology | Admitting: Hematology and Oncology

## 2019-06-24 ENCOUNTER — Ambulatory Visit (INDEPENDENT_AMBULATORY_CARE_PROVIDER_SITE_OTHER): Payer: Medicare HMO | Admitting: Family

## 2019-06-24 VITALS — BP 130/80 | HR 85 | Temp 98.1°F | Ht 64.0 in | Wt 159.8 lb

## 2019-06-24 DIAGNOSIS — I10 Essential (primary) hypertension: Secondary | ICD-10-CM | POA: Diagnosis not present

## 2019-06-24 DIAGNOSIS — E119 Type 2 diabetes mellitus without complications: Secondary | ICD-10-CM

## 2019-06-24 DIAGNOSIS — Z1231 Encounter for screening mammogram for malignant neoplasm of breast: Secondary | ICD-10-CM

## 2019-06-24 DIAGNOSIS — H34832 Tributary (branch) retinal vein occlusion, left eye, with macular edema: Secondary | ICD-10-CM | POA: Diagnosis not present

## 2019-06-24 DIAGNOSIS — R7989 Other specified abnormal findings of blood chemistry: Secondary | ICD-10-CM

## 2019-06-24 DIAGNOSIS — E782 Mixed hyperlipidemia: Secondary | ICD-10-CM | POA: Diagnosis not present

## 2019-06-24 DIAGNOSIS — Z794 Long term (current) use of insulin: Secondary | ICD-10-CM | POA: Diagnosis not present

## 2019-06-24 LAB — CBC WITH DIFFERENTIAL/PLATELET
Basophils Absolute: 0 10*3/uL (ref 0.0–0.1)
Basophils Relative: 0.7 % (ref 0.0–3.0)
Eosinophils Absolute: 0.1 10*3/uL (ref 0.0–0.7)
Eosinophils Relative: 1.6 % (ref 0.0–5.0)
HCT: 44.2 % (ref 36.0–46.0)
Hemoglobin: 14.7 g/dL (ref 12.0–15.0)
Lymphocytes Relative: 31.4 % (ref 12.0–46.0)
Lymphs Abs: 1.5 10*3/uL (ref 0.7–4.0)
MCHC: 33.3 g/dL (ref 30.0–36.0)
MCV: 90.3 fl (ref 78.0–100.0)
Monocytes Absolute: 0.6 10*3/uL (ref 0.1–1.0)
Monocytes Relative: 11.7 % (ref 3.0–12.0)
Neutro Abs: 2.6 10*3/uL (ref 1.4–7.7)
Neutrophils Relative %: 54.6 % (ref 43.0–77.0)
Platelets: 285 10*3/uL (ref 150.0–400.0)
RBC: 4.89 Mil/uL (ref 3.87–5.11)
RDW: 13.6 % (ref 11.5–15.5)
WBC: 4.8 10*3/uL (ref 4.0–10.5)

## 2019-06-24 LAB — COMPREHENSIVE METABOLIC PANEL
ALT: 106 U/L — ABNORMAL HIGH (ref 0–35)
AST: 55 U/L — ABNORMAL HIGH (ref 0–37)
Albumin: 4.5 g/dL (ref 3.5–5.2)
Alkaline Phosphatase: 92 U/L (ref 39–117)
BUN: 7 mg/dL (ref 6–23)
CO2: 28 mEq/L (ref 19–32)
Calcium: 11.1 mg/dL — ABNORMAL HIGH (ref 8.4–10.5)
Chloride: 101 mEq/L (ref 96–112)
Creatinine, Ser: 0.74 mg/dL (ref 0.40–1.20)
GFR: 96.34 mL/min (ref >60.00)
Glucose, Bld: 236 mg/dL — ABNORMAL HIGH (ref 70–99)
Potassium: 3 mEq/L — ABNORMAL LOW (ref 3.5–5.1)
Sodium: 141 mEq/L (ref 135–145)
Total Bilirubin: 0.6 mg/dL (ref 0.2–1.2)
Total Protein: 8.3 g/dL (ref 6.0–8.3)

## 2019-06-24 MED ORDER — METFORMIN HCL 500 MG PO TABS: 500.0000 mg | ORAL_TABLET | Freq: Two times a day (BID) | ORAL | 0 refills | Status: DC

## 2019-06-24 MED ORDER — POTASSIUM CHLORIDE CRYS ER 20 MEQ PO TBCR: 20.0000 meq | EXTENDED_RELEASE_TABLET | Freq: Every day | ORAL | 0 refills | Status: DC

## 2019-06-24 NOTE — Telephone Encounter (Signed)
Scheduled appt per 3.23 los.  Sent a message to HIM pool to get a calendar mailed out.
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
## 2019-06-24 NOTE — Patient Instructions (Signed)
Please start the Metformin once per day x 1 week and then increase to twice a day;  Take all your medications to the endocrinologist;

## 2019-06-29 ENCOUNTER — Telehealth: Payer: Medicare HMO | Admitting: Physician Assistant

## 2019-06-29 ENCOUNTER — Telehealth: Payer: Self-pay | Admitting: Family

## 2019-06-29 DIAGNOSIS — R112 Nausea with vomiting, unspecified: Secondary | ICD-10-CM

## 2019-06-29 NOTE — Telephone Encounter (Signed)
    Pt c/o medication issue:  1. Name of Medication: potassium chloride SA (KLOR-CON) 20 MEQ tablet  2. How are you currently taking this medication (dosage and times per day)? As written  3. Are you having a reaction (difficulty breathing--STAT)? no  4. What is your medication issue? Daughter calling to report the patient has been feeling nauseous since starting potassium chloride SA (KLOR-CON) 20 MEQ tablet and has side pain

## 2019-06-29 NOTE — Progress Notes (Signed)
Based on what you shared with me, I feel your condition warrants further evaluation and I recommend that you be seen for a face to face office visit.  I am concerned that your potassium may be even lower due to the vomiting. With your history of diabetes, you may also be quite dehydrated. I would recommend seeking evaluation at an urgent care or emergency department today for repeat labwork to check your blood sugars and electrolytes and make sure you are rehydrated with IV fluids if indicated.     NOTE: If you entered your credit card information for this eVisit, you will not be charged. You may see a "hold" on your card for the $35 but that hold will drop off and you will not have a charge processed.   If you are having a true medical emergency please call 911.      For an urgent face to face visit, [facility] has five urgent care centers for your convenience:      NEW:  [facility]gent [place] at [place] Get Driving Directions [id number] [address] [place], [place] [address] . 10 am - 6pm Monday - Friday    [facility]) Get Driving Directions [id number] [address] [place], [place] [address] . 10 am to 8 pm Monday-Friday . 12 pm to 8 pm [facility] at [facility] Get Driving Directions [id number] [place], [place], [place] [address] . 8 am to 8 pm Monday-Friday . 9 am to 6 pm Saturday . 11 am to 6 pm Sunday     [facility] at [facility] Get Driving Directions  [id number] [address], [place] [address] . 8 am to 8 pm Monday-Friday . 8 am to 4 pm [facility] at [place] Get Driving Directions [id number] [address] [place], [place] [address] . 12 pm to 6 pm Monday-Friday      Your e-visit answers were reviewed by a board certified advanced clinical  practitioner to complete your personal care plan.  Thank you for using e-Visits.   Greater than 5 minutes, yet less than 10 minutes of time have been spent researching, coordinating, and implementing care for this patient today.

## 2019-06-30 ENCOUNTER — Encounter: Payer: Self-pay | Admitting: Family

## 2019-06-30 ENCOUNTER — Other Ambulatory Visit: Payer: Self-pay | Admitting: Family

## 2019-06-30 MED ORDER — PRAVASTATIN SODIUM 40 MG PO TABS: 40.0000 mg | ORAL_TABLET | Freq: Every day | ORAL | 3 refills | Status: DC

## 2019-06-30 MED ORDER — AMLODIPINE BESYLATE 10 MG PO TABS: 10.0000 mg | ORAL_TABLET | Freq: Every day | ORAL | 3 refills | Status: DC

## 2019-07-01 ENCOUNTER — Encounter: Payer: Self-pay | Admitting: Family

## 2019-07-01 ENCOUNTER — Other Ambulatory Visit: Payer: Medicare HMO

## 2019-07-01 NOTE — Telephone Encounter (Signed)
She was only supposed to take it for 5 days so she should be done. Doubt that is causing her problems; would need to be seen if the symptoms persist. Other question would be is it occurring when she takes her Metformin- we just increased that dose at last OV.

## 2019-07-01 NOTE — Telephone Encounter (Signed)
Spoke with patient's daughter, Pollyann Glen today and info given.

## 2019-07-02 ENCOUNTER — Other Ambulatory Visit (INDEPENDENT_AMBULATORY_CARE_PROVIDER_SITE_OTHER): Payer: Medicare HMO

## 2019-07-02 ENCOUNTER — Other Ambulatory Visit: Payer: Self-pay

## 2019-07-02 DIAGNOSIS — R7989 Other specified abnormal findings of blood chemistry: Secondary | ICD-10-CM

## 2019-07-02 LAB — COMPREHENSIVE METABOLIC PANEL
ALT: 49 U/L — ABNORMAL HIGH (ref 0–35)
AST: 44 U/L — ABNORMAL HIGH (ref 0–37)
Albumin: 4.1 g/dL (ref 3.5–5.2)
Alkaline Phosphatase: 78 U/L (ref 39–117)
BUN: 5 mg/dL — ABNORMAL LOW (ref 6–23)
CO2: 29 mEq/L (ref 19–32)
Calcium: 10.6 mg/dL — ABNORMAL HIGH (ref 8.4–10.5)
Chloride: 102 mEq/L (ref 96–112)
Creatinine, Ser: 0.59 mg/dL (ref 0.40–1.20)
GFR: 125.11 mL/min (ref >60.00)
Glucose, Bld: 150 mg/dL — ABNORMAL HIGH (ref 70–99)
Potassium: 3 mEq/L — ABNORMAL LOW (ref 3.5–5.1)
Sodium: 142 mEq/L (ref 135–145)
Total Bilirubin: 0.5 mg/dL (ref 0.2–1.2)
Total Protein: 7.5 g/dL (ref 6.0–8.3)

## 2019-07-04 ENCOUNTER — Encounter: Payer: Self-pay | Admitting: Hematology and Oncology

## 2019-07-07 ENCOUNTER — Other Ambulatory Visit: Payer: Self-pay | Admitting: Family

## 2019-07-07 MED ORDER — POTASSIUM CHLORIDE CRYS ER 20 MEQ PO TBCR: 20.0000 meq | EXTENDED_RELEASE_TABLET | Freq: Every day | ORAL | 0 refills | Status: DC

## 2019-07-10 ENCOUNTER — Encounter: Payer: Self-pay | Admitting: Licensed Clinical Social Worker

## 2019-07-10 NOTE — Progress Notes (Signed)
[place] CSW Progress Note  Clinical Education officer, museum received SCAT/ Access [place] paperwork from patient/ patient's daughter Pollyann Glen. CSW completed part B. One signature from patient is missing and needed to complete application. CSW spoke with daughter, Pollyann Glen, and notified her. [name] agreed to come pick up the paperwork Monday morning for her mom to sign and stated that they will then submit it to [employer]. No further needs at this time.    Edwinna Areola Junious Ragone , LCSW

## 2019-07-13 ENCOUNTER — Encounter: Payer: Self-pay | Admitting: Family

## 2019-07-14 ENCOUNTER — Other Ambulatory Visit: Payer: Self-pay | Admitting: Family

## 2019-07-14 ENCOUNTER — Encounter: Payer: Self-pay | Admitting: Hematology and Oncology

## 2019-07-14 ENCOUNTER — Telehealth: Payer: Self-pay

## 2019-07-14 DIAGNOSIS — C50919 Malignant neoplasm of unspecified site of unspecified female breast: Secondary | ICD-10-CM

## 2019-07-14 NOTE — Telephone Encounter (Signed)
RN placed phone call regarding MyChart message.    Pt reports extreme fatigue, weakness, nausea, and vomiting X 2 weeks.  Denies any fever.  Pt reports not drinking fluids well at home.  Daughter present during conversation and report pt vomiting X 1 in AM.  Pt was evaluated by PCP for hypokalemia.  Denies any muscle cramping, or change in heart rhythm.    RN reviewed with Sandi Mealy, PA-C - Pt set up for labs/symptom management for 4/14.  Pt's daughter unable to bring today for evaluation.

## 2019-07-15 ENCOUNTER — Other Ambulatory Visit: Payer: Self-pay

## 2019-07-15 ENCOUNTER — Inpatient Hospital Stay: Payer: Medicare HMO

## 2019-07-15 ENCOUNTER — Inpatient Hospital Stay: Payer: Medicare HMO | Attending: Hematology and Oncology | Admitting: Medical

## 2019-07-15 VITALS — BP 121/75 | HR 89 | Temp 98.2°F | Resp 18 | Ht 64.0 in | Wt 156.7 lb

## 2019-07-15 DIAGNOSIS — Z8349 Family history of other endocrine, nutritional and metabolic diseases: Secondary | ICD-10-CM | POA: Diagnosis not present

## 2019-07-15 DIAGNOSIS — Z803 Family history of malignant neoplasm of breast: Secondary | ICD-10-CM | POA: Diagnosis not present

## 2019-07-15 DIAGNOSIS — R112 Nausea with vomiting, unspecified: Secondary | ICD-10-CM

## 2019-07-15 DIAGNOSIS — E119 Type 2 diabetes mellitus without complications: Secondary | ICD-10-CM | POA: Insufficient documentation

## 2019-07-15 DIAGNOSIS — C7931 Secondary malignant neoplasm of brain: Secondary | ICD-10-CM | POA: Insufficient documentation

## 2019-07-15 DIAGNOSIS — Z808 Family history of malignant neoplasm of other organs or systems: Secondary | ICD-10-CM | POA: Diagnosis not present

## 2019-07-15 DIAGNOSIS — E876 Hypokalemia: Secondary | ICD-10-CM | POA: Diagnosis not present

## 2019-07-15 DIAGNOSIS — Z833 Family history of diabetes mellitus: Secondary | ICD-10-CM | POA: Insufficient documentation

## 2019-07-15 DIAGNOSIS — K219 Gastro-esophageal reflux disease without esophagitis: Secondary | ICD-10-CM | POA: Diagnosis not present

## 2019-07-15 DIAGNOSIS — C50411 Malignant neoplasm of upper-outer quadrant of right female breast: Secondary | ICD-10-CM | POA: Diagnosis present

## 2019-07-15 DIAGNOSIS — Z9011 Acquired absence of right breast and nipple: Secondary | ICD-10-CM | POA: Diagnosis not present

## 2019-07-15 DIAGNOSIS — Z8249 Family history of ischemic heart disease and other diseases of the circulatory system: Secondary | ICD-10-CM | POA: Diagnosis not present

## 2019-07-15 DIAGNOSIS — Z923 Personal history of irradiation: Secondary | ICD-10-CM | POA: Insufficient documentation

## 2019-07-15 DIAGNOSIS — C50919 Malignant neoplasm of unspecified site of unspecified female breast: Secondary | ICD-10-CM

## 2019-07-15 DIAGNOSIS — Z79811 Long term (current) use of aromatase inhibitors: Secondary | ICD-10-CM | POA: Diagnosis not present

## 2019-07-15 DIAGNOSIS — Z170 Estrogen receptor positive status [ER+]: Secondary | ICD-10-CM | POA: Diagnosis not present

## 2019-07-15 DIAGNOSIS — Z9221 Personal history of antineoplastic chemotherapy: Secondary | ICD-10-CM | POA: Diagnosis not present

## 2019-07-15 LAB — CMP (CANCER CENTER ONLY)
ALT: 39 U/L (ref 0–44)
AST: 40 U/L (ref 15–41)
Albumin: 3.4 g/dL — ABNORMAL LOW (ref 3.5–5.0)
Alkaline Phosphatase: 76 U/L (ref 38–126)
Anion gap: 11 (ref 5–15)
BUN: 6 mg/dL — ABNORMAL LOW (ref 8–23)
CO2: 25 mmol/L (ref 22–32)
Calcium: 10.6 mg/dL — ABNORMAL HIGH (ref 8.9–10.3)
Chloride: 108 mmol/L (ref 98–111)
Creatinine: 0.71 mg/dL (ref 0.44–1.00)
GFR, Est AFR Am: >60 mL/min (ref >60)
GFR, Estimated: >60 mL/min (ref >60)
Glucose, Bld: 147 mg/dL — ABNORMAL HIGH (ref 70–99)
Potassium: 3.9 mmol/L (ref 3.5–5.1)
Sodium: 144 mmol/L (ref 135–145)
Total Bilirubin: 0.4 mg/dL (ref 0.3–1.2)
Total Protein: 7.1 g/dL (ref 6.5–8.1)

## 2019-07-15 LAB — MAGNESIUM: Magnesium: 1.7 mg/dL (ref 1.7–2.4)

## 2019-07-15 LAB — CBC WITH DIFFERENTIAL (CANCER CENTER ONLY)
Abs Immature Granulocytes: 0.02 10*3/uL (ref 0.00–0.07)
Basophils Absolute: 0 10*3/uL (ref 0.0–0.1)
Basophils Relative: 1 %
Eosinophils Absolute: 0.1 10*3/uL (ref 0.0–0.5)
Eosinophils Relative: 3 %
HCT: 42.1 % (ref 36.0–46.0)
Hemoglobin: 13.9 g/dL (ref 12.0–15.0)
Immature Granulocytes: 1 %
Lymphocytes Relative: 35 %
Lymphs Abs: 1.6 10*3/uL (ref 0.7–4.0)
MCH: 29.2 pg (ref 26.0–34.0)
MCHC: 33 g/dL (ref 30.0–36.0)
MCV: 88.4 fL (ref 80.0–100.0)
Monocytes Absolute: 0.5 10*3/uL (ref 0.1–1.0)
Monocytes Relative: 12 %
Neutro Abs: 2.2 10*3/uL (ref 1.7–7.7)
Neutrophils Relative %: 48 %
Platelet Count: 249 10*3/uL (ref 150–400)
RBC: 4.76 MIL/uL (ref 3.87–5.11)
RDW: 12.4 % (ref 11.5–15.5)
WBC Count: 4.4 10*3/uL (ref 4.0–10.5)
nRBC: 0 % (ref 0.0–0.2)

## 2019-07-15 MED ORDER — METOCLOPRAMIDE HCL 5 MG PO TABS: ORAL_TABLET | ORAL | 2 refills | Status: DC

## 2019-07-15 NOTE — Patient Instructions (Signed)

## 2019-07-17 NOTE — Progress Notes (Signed)
Symptoms Management Clinic Progress Note   Tasha Ortiz [id number] 01-05-1956 64 y.o.  Tasha Ortiz is managed by Drs Tasha Ortiz and Tasha Ortiz  Actively treated with chemotherapy/immunotherapy/hormonal therapy: yes  Current therapy: anastrozole  Next scheduled appointment with provider: 03/21/2020 Tasha Ortiz) 06/23/2020 Tasha Ortiz)  Assessment: Plan:    Non-intractable vomiting with nausea, unspecified vomiting type - Plan: metoCLOPramide (REGLAN) 5 MG tablet  Metastatic breast cancer ([place])  Hypokalemia   Nausea and vomiting: Patient was given a prescription for metoclopramide 5 mg with instructions to begin twice daily and increase to 4 times daily as needed.  She and her daughter were told to stop this immediately should she develop any facial twitching or abnormal movements.  Metastatic breast cancer: Patient continues to be treated with anastrozole and will be seen in follow-up by Tasha Ortiz on 06/23/2020.  Hypokalemia: A chemistry panel returned with a potassium which was within normal range at 3.9.  She was told to continue her supplemental potassium for another week.  Please see After Visit Summary for patient specific instructions.  Future Appointments  Date Time Provider S.N.P.[place].  03/21/2020  9:00 AM Vaslow, Acey Lav, MD [facility] None  06/03/2020  8:45 AM Tasha Lose, MD [facility] None    No orders of the defined types were placed in this encounter.      Subjective:   Patient ID:  Tasha Ortiz is a 64 y.o. (DOB [birth year]) female.  Chief Complaint:  Chief Complaint  Patient presents with  . Fatigue    HPI [name] is a 64 y.o. female with a diagnosis of a metastatic breast cancer and bilateral pulmonary emboli.  She presents to the clinic today having last been seen by Tasha Ortiz on 06/04/2019.  She was recently seen by her primary care provider Tasha Mourning, FNP.  The patient's presents to the clinic today with  recent intermittent nausea, vomiting, anorexia, fatigue, and weakness.  She reports having body aches in her lower back, knees, and legs.  She was recently found to have hypokalemia and was begun on supplemental potassium.  She continues on this supplement.  A repeat chemistry panel today returned with a potassium within normal range at 3.9.  She has a history of diabetes and will be seeing an endocrinologist on 07/22/2019.  She denies shortness of breath, chest pain, or dyspnea on exertion.  She is having no nausea or vomiting today.  She reports having some episodes of vomiting liquid and other episodes of vomiting food after eating.  She continues on anastrozole 1 mg p.o. daily.  Her recent restaging CT scans of the chest abdomen pelvis from 06/03/2019 showed no evidence of metastatic disease within the chest, abdomen, or pelvis.  Hepatic steatosis was noted.  A brain MRI from 06/17/2019 returned showing stable posttreatment changes in the posterior fossa without evidence of a locally recurrent tumor or new intracranial metastatic disease.  Moderate chronic small vessel ischemic disease was noted.  Tasha Ortiz presents to the clinic today with her daughter.  Medications: I have reviewed the patient's current medications.  Allergies:  Allergies  Allergen Reactions  . Lisinopril Swelling    Past Medical History:  Diagnosis Date  . Acid reflux   . Allergy    seasonal  . Breast cancer ([place])    right,no iv or blood on right sid, chemo and radiation 2014  . Family history of brain cancer   . Family history of breast cancer   . Heartburn   .  Hypercholesteremia    taken off chol meds Dec 2012  . Hypertension   . Hypertension   . Personal history of chemotherapy 05/2012   rt breast  . Personal history of radiation therapy 10/2012   rt breast    Past Surgical History:  Procedure Laterality Date  . BREAST BIOPSY Right 04/16/2012  . BREAST SURGERY Right 05/20/12   Rt br mastectomy  .  CESAREAN SECTION  25 years ago  . colonoscopy    . COLONOSCOPY WITH PROPOFOL N/A 01/18/2015   Procedure: COLONOSCOPY WITH PROPOFOL;  Surgeon: Mauri Pole, MD;  [facility]: [facility];  Service: Endoscopy;  Laterality: N/A;  . MASTECTOMY Right 05/2012  . MODIFIED MASTECTOMY Right 05/20/2012   Procedure: MODIFIED MASTECTOMY;  Surgeon: Edward Jolly, MD;  Location: [facility];  Service: General;  Laterality: Right;  . PORTACATH PLACEMENT Left 05/20/2012   Procedure: INSERTION PORT-A-CATH;  Surgeon: Edward Jolly, MD;  Location: [facility];  Service: General;  Laterality: Left;  . SUBOCCIPITAL CRANIECTOMY CERVICAL LAMINECTOMY N/A 11/21/2016   Procedure: SUBOCCIPITAL CRANIECTOMY RESECTION TUMOR;  Surgeon: Newman Pies, MD;  Location: [facility];  Service: Neurosurgery;  Laterality: N/A;  . TUBAL LIGATION      Family History  Problem Relation Age of Onset  . Hypertension Mother   . Aneurysm Mother 17  . Brain cancer Sister 26  . Breast cancer Cousin 63  . Jaundice Father 93  . Diabetes Daughter   . Breast cancer Cousin 65  . Breast cancer Cousin 72  . Colon cancer Neg Hx     Social History   Socioeconomic History  . Marital status: Legally Separated    Spouse name: Not on file  . Number of children: 2  . Years of education: 52  . Highest education level: Not on file  Occupational History  . Occupation: Pension scheme manager  Tobacco Use  . Smoking status: Never Smoker  . Smokeless tobacco: Former Systems developer    Types: Chew  Substance and Sexual Activity  . Alcohol use: No    Alcohol/week: 0.0 standard drinks  . Drug use: No  . Sexual activity: Never    Comment: menarche 56, P2, no HRT  Other Topics Concern  . Not on file  Social History Narrative   Fun: Theodoro Kos   Denies religious beliefs effecting health care.    Social Determinants of Health   Financial Resource Strain:   . Difficulty of Paying Living Expenses:   Food Insecurity:   . Worried About Charity fundraiser in  the Last Year:   . Arboriculturist in the Last Year:   Transportation Needs:   . Film/video editor (Medical):   Marland Kitchen Lack of Transportation (Non-Medical):   Physical Activity:   . Days of Exercise per Week:   . Minutes of Exercise per Session:   Stress:   . Feeling of Stress :   Social Connections:   . Frequency of Communication with Friends and Family:   . Frequency of Social Gatherings with Friends and Family:   . Attends Religious Services:   . Active Member of Clubs or Organizations:   . Attends Archivist Meetings:   Marland Kitchen Marital Status:   Intimate Partner Violence:   . Fear of Current or Ex-Partner:   . Emotionally Abused:   Marland Kitchen Physically Abused:   . Sexually Abused:     Past Medical History, Surgical history, Social history, and Family history were reviewed and updated as appropriate.  Please see review of systems for further details on the patient's review from today.   Review of Systems:  Review of Systems  Constitutional: Positive for fatigue and unexpected weight change. Negative for appetite change, chills, diaphoresis and fever.  HENT: Negative for trouble swallowing.   Respiratory: Negative for cough, chest tightness and shortness of breath.   Cardiovascular: Negative for chest pain and palpitations.  Gastrointestinal: Negative for abdominal pain, constipation, diarrhea, nausea and vomiting.  Musculoskeletal: Positive for arthralgias and back pain.  Neurological: Positive for weakness. Negative for dizziness and headaches.    Objective:   Physical Exam:  BP 121/75 (BP Location: Left Arm, Patient Position: Sitting)   Pulse 89   Temp 98.2 F (36.8 C)   Resp 18   Ht 5\' 4"  (1.626 m)   Wt 156 lb 11.2 oz (71.1 kg)   LMP 08/16/2012   SpO2 100%   BMI 26.90 kg/m  ECOG: 1  Physical Exam Constitutional:      General: She is not in acute distress.    Appearance: She is not diaphoretic.  HENT:     Head: Normocephalic and atraumatic.  Eyes:      General: No scleral icterus.       Right eye: No discharge.        Left eye: No discharge.  Cardiovascular:     Rate and Rhythm: Normal rate and regular rhythm.     Heart sounds: Normal heart sounds. No murmur. No friction rub. No gallop.   Pulmonary:     Effort: Pulmonary effort is normal. No respiratory distress.     Breath sounds: Normal breath sounds. No wheezing or rales.  Abdominal:     General: Abdomen is flat. Bowel sounds are normal. There is no distension.     Palpations: Abdomen is soft.     Tenderness: There is no abdominal tenderness. There is no guarding.  Skin:    General: Skin is warm and dry.     Findings: No erythema or rash.  Neurological:     Mental Status: She is alert.     Gait: Gait abnormal (The patient is ambulating with the use of a wheelchair.).  Psychiatric:        Mood and Affect: Mood normal.        Behavior: Behavior normal.        Thought Content: Thought content normal.        Judgment: Judgment normal.     Lab Review:     Component Value Date/Time   NA 144 07/15/2019 0825   NA 143 07/05/2014 1101   K 3.9 07/15/2019 0825   K 3.7 07/05/2014 1101   CL 108 07/15/2019 0825   CL 103 09/23/2012 1040   CO2 25 07/15/2019 0825   CO2 27 07/05/2014 1101   GLUCOSE 147 (H) 07/15/2019 0825   GLUCOSE 113 07/05/2014 1101   GLUCOSE 140 (H) 09/23/2012 1040   BUN 6 (L) 07/15/2019 0825   BUN 11.8 07/05/2014 1101   CREATININE 0.71 07/15/2019 0825   CREATININE 0.8 07/05/2014 1101   CALCIUM 10.6 (H) 07/15/2019 0825   CALCIUM 9.4 07/05/2014 1101   PROT 7.1 07/15/2019 0825   PROT 6.6 07/05/2014 1101   ALBUMIN 3.4 (L) 07/15/2019 0825   ALBUMIN 3.4 (L) 07/05/2014 1101   AST 40 07/15/2019 0825   AST 18 07/05/2014 1101   ALT 39 07/15/2019 0825   ALT 15 07/05/2014 1101   ALKPHOS 76 07/15/2019 0825   ALKPHOS 45 07/05/2014 1101  BILITOT 0.4 07/15/2019 0825   BILITOT 0.21 07/05/2014 1101   GFRNONAA >60 07/15/2019 0825   GFRAA >60 07/15/2019 0825         Component Value Date/Time   WBC 4.4 07/15/2019 0825   WBC 4.8 06/24/2019 1012   RBC 4.76 07/15/2019 0825   HGB 13.9 07/15/2019 0825   HGB 12.3 07/05/2014 1101   HCT 42.1 07/15/2019 0825   HCT 36.8 07/05/2014 1101   PLT 249 07/15/2019 0825   PLT 213 07/05/2014 1101   MCV 88.4 07/15/2019 0825   MCV 87.1 07/05/2014 1101   MCH 29.2 07/15/2019 0825   MCHC 33.0 07/15/2019 0825   RDW 12.4 07/15/2019 0825   RDW 12.5 07/05/2014 1101   LYMPHSABS 1.6 07/15/2019 0825   LYMPHSABS 2.1 07/05/2014 1101   MONOABS 0.5 07/15/2019 0825   MONOABS 0.4 07/05/2014 1101   EOSABS 0.1 07/15/2019 0825   EOSABS 0.2 07/05/2014 1101   BASOSABS 0.0 07/15/2019 0825   BASOSABS 0.0 07/05/2014 1101   -------------------------------  Imaging from last 24 hours (if applicable):  Radiology interpretation: MM 3D SCREEN BREAST UNI LEFT  Result Date: 06/24/2019 CLINICAL DATA:  Screening. EXAM: DIGITAL SCREENING UNILATERAL LEFT MAMMOGRAM WITH CAD AND TOMO COMPARISON:  Previous exam(s). ACR Breast Density Category b: There are scattered areas of fibroglandular density. FINDINGS: The patient has had a right mastectomy. There are no findings suspicious for malignancy. Images were processed with CAD. IMPRESSION: No mammographic evidence of malignancy. A result letter of this screening mammogram will be mailed directly to the patient. RECOMMENDATION: Screening mammogram in one year.  (Code:SM-L-49M) BI-RADS CATEGORY  1: Negative. Electronically Signed   By: Curlene Dolphin M.D.   On: 06/24/2019 09:24

## 2019-07-17 NOTE — Addendum Note (Signed)
Addended by: Sandi Mealy E on: 07/17/2019 11:12 AM   Modules accepted: Level of Service

## 2019-07-22 DIAGNOSIS — Z794 Long term (current) use of insulin: Secondary | ICD-10-CM | POA: Diagnosis not present

## 2019-07-22 DIAGNOSIS — H34832 Tributary (branch) retinal vein occlusion, left eye, with macular edema: Secondary | ICD-10-CM | POA: Diagnosis not present

## 2019-07-22 DIAGNOSIS — H34811 Central retinal vein occlusion, right eye, with macular edema: Secondary | ICD-10-CM | POA: Diagnosis not present

## 2019-07-22 DIAGNOSIS — E1165 Type 2 diabetes mellitus with hyperglycemia: Secondary | ICD-10-CM | POA: Diagnosis not present

## 2019-07-27 ENCOUNTER — Telehealth: Payer: Self-pay | Admitting: *Deleted

## 2019-07-27 ENCOUNTER — Other Ambulatory Visit: Payer: Self-pay | Admitting: *Deleted

## 2019-07-27 DIAGNOSIS — C7931 Secondary malignant neoplasm of brain: Secondary | ICD-10-CM

## 2019-07-27 NOTE — Telephone Encounter (Signed)
Faxed referral for in home care to [facility].  Pending response on acceptance.

## 2019-07-29 ENCOUNTER — Other Ambulatory Visit: Payer: Self-pay | Admitting: Family

## 2019-07-30 DIAGNOSIS — M79605 Pain in left leg: Secondary | ICD-10-CM | POA: Diagnosis not present

## 2019-07-30 DIAGNOSIS — M79604 Pain in right leg: Secondary | ICD-10-CM | POA: Diagnosis not present

## 2019-07-31 DIAGNOSIS — M79604 Pain in right leg: Secondary | ICD-10-CM | POA: Diagnosis not present

## 2019-07-31 DIAGNOSIS — M79605 Pain in left leg: Secondary | ICD-10-CM | POA: Diagnosis not present

## 2019-07-31 DIAGNOSIS — R69 Illness, unspecified: Secondary | ICD-10-CM | POA: Diagnosis not present

## 2019-08-01 ENCOUNTER — Other Ambulatory Visit: Payer: Self-pay | Admitting: Family

## 2019-08-01 DIAGNOSIS — E876 Hypokalemia: Secondary | ICD-10-CM

## 2019-08-03 NOTE — Telephone Encounter (Signed)
Her potassium looks like it was low again when she was seen at the urgent care on 4/30. I want her to make sure she is taking the supplement everyday. Does she have labs planned with anyone else soon? If not, I would like her to go to [facility] in about 2 weeks so we can re-check.

## 2019-08-03 NOTE — Telephone Encounter (Signed)
Left message for patient to return call to clinic.

## 2019-08-07 ENCOUNTER — Other Ambulatory Visit: Payer: Self-pay | Admitting: Family

## 2019-08-07 DIAGNOSIS — E876 Hypokalemia: Secondary | ICD-10-CM

## 2019-08-30 ENCOUNTER — Telehealth: Payer: Medicare HMO | Admitting: Nurse Practitioner

## 2019-08-30 DIAGNOSIS — M79662 Pain in left lower leg: Secondary | ICD-10-CM

## 2019-08-30 NOTE — Progress Notes (Signed)
Based on what you shared with me it looks like you have bil lower ext ache,that should be evaluated in a face to face office visit. I do not know what I would be treating. You need to be seen so that you can get blood work done for proper treatment.    NOTE: If you entered your credit card information for this eVisit, you will not be charged. You may see a "hold" on your card for the $35 but that hold will drop off and you will not have a charge processed.  If you are having a true medical emergency please call 911.     For an urgent face to face visit, [facility] has four urgent care centers for your convenience:   . [facility]    [phone]                  Get Driving Directions  [id number] [place], [place] [address] . 10 am to 8 pm Monday-Friday . 12 pm to 8 pm Saturday-Sunday   . [facility] [place]                  Get Driving Directions  [phone] [place], [place], [place] [address] . 8 am to 8 pm Monday-Friday . 9 am to 6 pm Saturday . 11 am to 6 pm Sunday   . [facility] [place]                  Get Driving Directions   [address] [place], [place] [address] . 8 am to 8 pm Monday-Friday . 8 am to 4 pm Saturday-Sunday    . [facility] [place]                    Get Driving Directions  [id number]  [address], [place], [place] [address]  . Monday-Friday, 12 PM to 6 PM    Your e-visit answers were reviewed by a board certified advanced clinical practitioner to complete your personal care plan.  Thank you for using e-Visits.

## 2019-09-02 DIAGNOSIS — H25043 Posterior subcapsular polar age-related cataract, bilateral: Secondary | ICD-10-CM | POA: Diagnosis not present

## 2019-09-02 DIAGNOSIS — H34811 Central retinal vein occlusion, right eye, with macular edema: Secondary | ICD-10-CM | POA: Diagnosis not present

## 2019-09-02 DIAGNOSIS — H40043 Steroid responder, bilateral: Secondary | ICD-10-CM | POA: Diagnosis not present

## 2019-09-02 DIAGNOSIS — H34832 Tributary (branch) retinal vein occlusion, left eye, with macular edema: Secondary | ICD-10-CM | POA: Diagnosis not present

## 2019-09-04 ENCOUNTER — Encounter: Payer: Self-pay | Admitting: Family

## 2019-09-10 ENCOUNTER — Encounter: Payer: Self-pay | Admitting: Family

## 2019-09-15 ENCOUNTER — Other Ambulatory Visit: Payer: Self-pay | Admitting: Family

## 2019-09-18 ENCOUNTER — Ambulatory Visit (INDEPENDENT_AMBULATORY_CARE_PROVIDER_SITE_OTHER): Payer: Medicare HMO | Admitting: Family

## 2019-09-18 ENCOUNTER — Other Ambulatory Visit: Payer: Self-pay

## 2019-09-18 ENCOUNTER — Encounter: Payer: Self-pay | Admitting: Family

## 2019-09-18 VITALS — BP 110/80 | HR 85 | Temp 98.4°F | Ht 64.0 in

## 2019-09-18 DIAGNOSIS — M25562 Pain in left knee: Secondary | ICD-10-CM | POA: Diagnosis not present

## 2019-09-18 DIAGNOSIS — M2550 Pain in unspecified joint: Secondary | ICD-10-CM

## 2019-09-18 DIAGNOSIS — M25561 Pain in right knee: Secondary | ICD-10-CM

## 2019-09-18 MED ORDER — PREDNISONE 20 MG PO TABS
20.0000 mg | ORAL_TABLET | Freq: Every day | ORAL | 0 refills | Status: DC
Start: 2019-09-18 — End: 2019-11-20
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
## 2019-09-21 ENCOUNTER — Other Ambulatory Visit (INDEPENDENT_AMBULATORY_CARE_PROVIDER_SITE_OTHER): Payer: Medicare HMO

## 2019-09-21 DIAGNOSIS — E876 Hypokalemia: Secondary | ICD-10-CM

## 2019-09-21 DIAGNOSIS — M2550 Pain in unspecified joint: Secondary | ICD-10-CM | POA: Diagnosis not present

## 2019-09-21 LAB — CBC WITH DIFFERENTIAL/PLATELET
Basophils Absolute: 0 10*3/uL (ref 0.0–0.1)
Basophils Relative: 0.2 % (ref 0.0–3.0)
Eosinophils Absolute: 0 10*3/uL (ref 0.0–0.7)
Eosinophils Relative: 0.6 % (ref 0.0–5.0)
HCT: 36.6 % (ref 36.0–46.0)
Hemoglobin: 12.5 g/dL (ref 12.0–15.0)
Lymphocytes Relative: 40.5 % (ref 12.0–46.0)
Lymphs Abs: 2.5 10*3/uL (ref 0.7–4.0)
MCHC: 34.1 g/dL (ref 30.0–36.0)
MCV: 84.9 fl (ref 78.0–100.0)
Monocytes Absolute: 0.5 10*3/uL (ref 0.1–1.0)
Monocytes Relative: 8 % (ref 3.0–12.0)
Neutro Abs: 3.1 10*3/uL (ref 1.4–7.7)
Neutrophils Relative %: 50.7 % (ref 43.0–77.0)
Platelets: 254 10*3/uL (ref 150.0–400.0)
RBC: 4.31 Mil/uL (ref 3.87–5.11)
RDW: 13.6 % (ref 11.5–15.5)
WBC: 6.1 10*3/uL (ref 4.0–10.5)

## 2019-09-21 LAB — COMPREHENSIVE METABOLIC PANEL
ALT: 10 U/L (ref 0–35)
AST: 13 U/L (ref 0–37)
Albumin: 4 g/dL (ref 3.5–5.2)
Alkaline Phosphatase: 51 U/L (ref 39–117)
BUN: 12 mg/dL (ref 6–23)
CO2: 24 mEq/L (ref 19–32)
Calcium: 9.5 mg/dL (ref 8.4–10.5)
Chloride: 109 mEq/L (ref 96–112)
Creatinine, Ser: 0.63 mg/dL (ref 0.40–1.20)
GFR: 115.91 mL/min (ref >60.00)
Glucose, Bld: 83 mg/dL (ref 70–99)
Potassium: 3.2 mEq/L — ABNORMAL LOW (ref 3.5–5.1)
Sodium: 143 mEq/L (ref 135–145)
Total Bilirubin: 0.3 mg/dL (ref 0.2–1.2)
Total Protein: 6.9 g/dL (ref 6.0–8.3)

## 2019-09-21 LAB — URIC ACID: Uric Acid, Serum: 3.8 mg/dL (ref 2.4–7.0)

## 2019-09-25 ENCOUNTER — Ambulatory Visit (INDEPENDENT_AMBULATORY_CARE_PROVIDER_SITE_OTHER)
Admission: RE | Admit: 2019-09-25 | Discharge: 2019-09-25 | Disposition: A | Discharge status: Home / Self Care | Payer: Medicare HMO | Source: Ambulatory Visit | Attending: Family | Admitting: Family

## 2019-09-25 ENCOUNTER — Other Ambulatory Visit: Payer: Self-pay

## 2019-09-25 DIAGNOSIS — M25561 Pain in right knee: Secondary | ICD-10-CM | POA: Diagnosis not present

## 2019-09-25 DIAGNOSIS — M25562 Pain in left knee: Secondary | ICD-10-CM | POA: Diagnosis not present

## 2019-09-29 ENCOUNTER — Telehealth: Payer: Self-pay | Admitting: Family

## 2019-09-29 ENCOUNTER — Encounter: Payer: Self-pay | Admitting: Family

## 2019-09-29 NOTE — Telephone Encounter (Signed)
New message:   Pt's daughter is calling to get the results of her knee x-ray. Please advise.

## 2019-09-30 ENCOUNTER — Other Ambulatory Visit: Payer: Self-pay | Admitting: Family

## 2019-09-30 DIAGNOSIS — M25561 Pain in right knee: Secondary | ICD-10-CM

## 2019-09-30 NOTE — Telephone Encounter (Signed)
Patient reviewed results with Laura's comments via [place].   Your x-rays of your knees did not show any acute changes. I would recommend that we have you see an orthopedist for further evaluation. Please let us know if you want [place] to set up the referral for you or if one of your daughters has someone they would like you to see. Please also make sure you are taking your potassium everyday. Please let us know if you have been taking it daily as your level was low again when we last checked it. We couldn't reach you and had to mail you a letter about those results.  Written by [name], FNP on 09/28/2019 9:10 AM EDT Seen by patient Tasha Ortiz on 09/29/2019 5:17 PM

## 2019-10-07 DIAGNOSIS — H34811 Central retinal vein occlusion, right eye, with macular edema: Secondary | ICD-10-CM | POA: Diagnosis not present

## 2019-10-07 DIAGNOSIS — H40043 Steroid responder, bilateral: Secondary | ICD-10-CM | POA: Diagnosis not present

## 2019-10-07 DIAGNOSIS — H25043 Posterior subcapsular polar age-related cataract, bilateral: Secondary | ICD-10-CM | POA: Diagnosis not present

## 2019-10-07 DIAGNOSIS — H2513 Age-related nuclear cataract, bilateral: Secondary | ICD-10-CM | POA: Diagnosis not present

## 2019-10-21 ENCOUNTER — Encounter: Payer: Self-pay | Admitting: Surgery

## 2019-10-21 ENCOUNTER — Ambulatory Visit (INDEPENDENT_AMBULATORY_CARE_PROVIDER_SITE_OTHER): Payer: Medicare HMO | Admitting: Surgery

## 2019-10-21 VITALS — BP 115/92 | HR 74 | Ht 64.0 in | Wt 156.0 lb

## 2019-10-21 DIAGNOSIS — M6751 Plica syndrome, right knee: Secondary | ICD-10-CM | POA: Diagnosis not present

## 2019-10-21 DIAGNOSIS — M25562 Pain in left knee: Secondary | ICD-10-CM | POA: Diagnosis not present

## 2019-10-21 DIAGNOSIS — M25561 Pain in right knee: Secondary | ICD-10-CM | POA: Diagnosis not present

## 2019-10-21 DIAGNOSIS — M2550 Pain in unspecified joint: Secondary | ICD-10-CM | POA: Diagnosis not present

## 2019-10-21 DIAGNOSIS — M6752 Plica syndrome, left knee: Secondary | ICD-10-CM | POA: Diagnosis not present

## 2019-10-21 MED ORDER — METHYLPREDNISOLONE ACETATE 40 MG/ML IJ SUSP
40.0000 mg | INTRAMUSCULAR | Status: AC | PRN
  Administered 2019-10-21: 40 mg via INTRA_ARTICULAR

## 2019-10-21 MED ORDER — BUPIVACAINE HCL 0.25 % IJ SOLN
6.0000 mL | INTRAMUSCULAR | Status: AC | PRN
  Administered 2019-10-21: 6 mL via INTRA_ARTICULAR

## 2019-10-21 MED ORDER — LIDOCAINE HCL 1 % IJ SOLN
3.0000 mL | INTRAMUSCULAR | Status: AC | PRN
  Administered 2019-10-21: 3 mL
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
## 2019-10-23 LAB — CBC
HCT: 43.2 % (ref 35.0–45.0)
Hemoglobin: 14 g/dL (ref 11.7–15.5)
MCH: 28.3 pg (ref 27.0–33.0)
MCHC: 32.4 g/dL (ref 32.0–36.0)
MCV: 87.4 fL (ref 80.0–100.0)
MPV: 10.7 fL (ref 7.5–12.5)
Platelets: 251 10*3/uL (ref 140–400)
RBC: 4.94 10*6/uL (ref 3.80–5.10)
RDW: 13.3 % (ref 11.0–15.0)
WBC: 5 10*3/uL (ref 3.8–10.8)

## 2019-10-23 LAB — URIC ACID: Uric Acid, Serum: 4.1 mg/dL (ref 2.5–7.0)

## 2019-10-23 LAB — RHEUMATOID FACTOR: Rheumatoid fact SerPl-aCnc: <14 IU/mL (ref <14)

## 2019-10-23 LAB — ANA: Anti Nuclear Antibody (ANA): NEGATIVE

## 2019-10-23 LAB — SEDIMENTATION RATE: Sed Rate: 43 mm/h — ABNORMAL HIGH (ref 0–30)

## 2019-11-09 DIAGNOSIS — R69 Illness, unspecified: Secondary | ICD-10-CM | POA: Diagnosis not present

## 2019-11-12 DIAGNOSIS — H25042 Posterior subcapsular polar age-related cataract, left eye: Secondary | ICD-10-CM | POA: Diagnosis not present

## 2019-11-12 DIAGNOSIS — H2512 Age-related nuclear cataract, left eye: Secondary | ICD-10-CM | POA: Diagnosis not present

## 2019-11-12 DIAGNOSIS — H25012 Cortical age-related cataract, left eye: Secondary | ICD-10-CM | POA: Diagnosis not present

## 2019-11-12 DIAGNOSIS — H25812 Combined forms of age-related cataract, left eye: Secondary | ICD-10-CM | POA: Diagnosis not present

## 2019-11-13 ENCOUNTER — Encounter: Payer: Self-pay | Admitting: Hematology and Oncology

## 2019-11-20 ENCOUNTER — Other Ambulatory Visit: Payer: Self-pay

## 2019-11-20 ENCOUNTER — Encounter: Payer: Self-pay | Admitting: Family

## 2019-11-20 ENCOUNTER — Ambulatory Visit (INDEPENDENT_AMBULATORY_CARE_PROVIDER_SITE_OTHER): Payer: Medicare HMO | Admitting: Family

## 2019-11-20 VITALS — BP 114/72 | HR 87 | Temp 97.6°F | Ht 64.0 in | Wt 137.0 lb

## 2019-11-20 DIAGNOSIS — R634 Abnormal weight loss: Secondary | ICD-10-CM

## 2019-11-20 DIAGNOSIS — Z794 Long term (current) use of insulin: Secondary | ICD-10-CM | POA: Diagnosis not present

## 2019-11-20 DIAGNOSIS — R1310 Dysphagia, unspecified: Secondary | ICD-10-CM | POA: Diagnosis not present

## 2019-11-20 DIAGNOSIS — E1169 Type 2 diabetes mellitus with other specified complication: Secondary | ICD-10-CM | POA: Diagnosis not present

## 2019-11-20 MED ORDER — SERTRALINE HCL 50 MG PO TABS: ORAL_TABLET | ORAL | 1 refills | Status: DC
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
## 2019-11-21 LAB — COMPREHENSIVE METABOLIC PANEL
AG Ratio: 1.4 (calc) (ref 1.0–2.5)
ALT: 9 U/L (ref 6–29)
AST: 13 U/L (ref 10–35)
Albumin: 3.8 g/dL (ref 3.6–5.1)
Alkaline phosphatase (APISO): 79 U/L (ref 37–153)
BUN: 14 mg/dL (ref 7–25)
CO2: 23 mmol/L (ref 20–32)
Calcium: 9.3 mg/dL (ref 8.6–10.4)
Chloride: 105 mmol/L (ref 98–110)
Creat: 0.5 mg/dL (ref 0.50–0.99)
Globulin: 2.7 g/dL (calc) (ref 1.9–3.7)
Glucose, Bld: 96 mg/dL (ref 65–99)
Potassium: 3.9 mmol/L (ref 3.5–5.3)
Sodium: 138 mmol/L (ref 135–146)
Total Bilirubin: 0.3 mg/dL (ref 0.2–1.2)
Total Protein: 6.5 g/dL (ref 6.1–8.1)

## 2019-11-21 LAB — CBC WITH DIFFERENTIAL/PLATELET
Absolute Monocytes: 451 cells/uL (ref 200–950)
Basophils Absolute: 22 cells/uL (ref 0–200)
Basophils Relative: 0.4 %
Eosinophils Absolute: 61 cells/uL (ref 15–500)
Eosinophils Relative: 1.1 %
HCT: 38.9 % (ref 35.0–45.0)
Hemoglobin: 13.2 g/dL (ref 11.7–15.5)
Lymphs Abs: 2118 cells/uL (ref 850–3900)
MCH: 29.1 pg (ref 27.0–33.0)
MCHC: 33.9 g/dL (ref 32.0–36.0)
MCV: 85.7 fL (ref 80.0–100.0)
MPV: 10.4 fL (ref 7.5–12.5)
Monocytes Relative: 8.2 %
Neutro Abs: 2849 cells/uL (ref 1500–7800)
Neutrophils Relative %: 51.8 %
Platelets: 227 10*3/uL (ref 140–400)
RBC: 4.54 10*6/uL (ref 3.80–5.10)
RDW: 13.8 % (ref 11.0–15.0)
Total Lymphocyte: 38.5 %
WBC: 5.5 10*3/uL (ref 3.8–10.8)

## 2019-11-21 LAB — HEMOGLOBIN A1C
Hgb A1c MFr Bld: 6 % of total Hgb — ABNORMAL HIGH (ref <5.7)
Mean Plasma Glucose: 126 (calc)
eAG (mmol/L): 7 (calc)

## 2019-11-21 LAB — TSH: TSH: 5.36 mIU/L — ABNORMAL HIGH (ref 0.40–4.50)

## 2019-11-27 ENCOUNTER — Encounter: Payer: Self-pay | Admitting: Nurse Practitioner

## 2019-11-27 ENCOUNTER — Encounter: Payer: Self-pay | Admitting: Family

## 2019-11-30 ENCOUNTER — Other Ambulatory Visit: Payer: Self-pay | Admitting: Family

## 2019-11-30 MED ORDER — LEVOTHYROXINE SODIUM 25 MCG PO TABS: 25.0000 ug | ORAL_TABLET | Freq: Every day | ORAL | 1 refills | Status: DC

## 2019-12-01 ENCOUNTER — Encounter (HOSPITAL_COMMUNITY): Payer: Self-pay

## 2019-12-01 ENCOUNTER — Emergency Department (HOSPITAL_COMMUNITY): Payer: Medicare HMO

## 2019-12-01 ENCOUNTER — Emergency Department (HOSPITAL_COMMUNITY)
Admission: EM | Admit: 2019-12-01 | Discharge: 2019-12-01 | Disposition: A | Discharge status: Home / Self Care | Payer: Medicare HMO | Attending: Emergency Medicine | Admitting: Emergency Medicine

## 2019-12-01 DIAGNOSIS — Z7901 Long term (current) use of anticoagulants: Secondary | ICD-10-CM | POA: Insufficient documentation

## 2019-12-01 DIAGNOSIS — R531 Weakness: Secondary | ICD-10-CM | POA: Insufficient documentation

## 2019-12-01 DIAGNOSIS — I10 Essential (primary) hypertension: Secondary | ICD-10-CM | POA: Diagnosis not present

## 2019-12-01 DIAGNOSIS — Z853 Personal history of malignant neoplasm of breast: Secondary | ICD-10-CM | POA: Insufficient documentation

## 2019-12-01 DIAGNOSIS — R1032 Left lower quadrant pain: Secondary | ICD-10-CM | POA: Diagnosis not present

## 2019-12-01 DIAGNOSIS — R55 Syncope and collapse: Secondary | ICD-10-CM | POA: Insufficient documentation

## 2019-12-01 DIAGNOSIS — D259 Leiomyoma of uterus, unspecified: Secondary | ICD-10-CM | POA: Diagnosis not present

## 2019-12-01 DIAGNOSIS — Z794 Long term (current) use of insulin: Secondary | ICD-10-CM | POA: Insufficient documentation

## 2019-12-01 DIAGNOSIS — Z79899 Other long term (current) drug therapy: Secondary | ICD-10-CM | POA: Diagnosis not present

## 2019-12-01 DIAGNOSIS — R42 Dizziness and giddiness: Secondary | ICD-10-CM | POA: Diagnosis not present

## 2019-12-01 DIAGNOSIS — I700 Atherosclerosis of aorta: Secondary | ICD-10-CM | POA: Diagnosis not present

## 2019-12-01 DIAGNOSIS — R1110 Vomiting, unspecified: Secondary | ICD-10-CM | POA: Diagnosis not present

## 2019-12-01 LAB — BASIC METABOLIC PANEL
Anion gap: 11 (ref 5–15)
BUN: 12 mg/dL (ref 8–23)
CO2: 26 mmol/L (ref 22–32)
Calcium: 9.6 mg/dL (ref 8.9–10.3)
Chloride: 106 mmol/L (ref 98–111)
Creatinine, Ser: 0.48 mg/dL (ref 0.44–1.00)
GFR calc Af Amer: >60 mL/min (ref >60)
GFR calc non Af Amer: >60 mL/min (ref >60)
Glucose, Bld: 96 mg/dL (ref 70–99)
Potassium: 3.3 mmol/L — ABNORMAL LOW (ref 3.5–5.1)
Sodium: 143 mmol/L (ref 135–145)

## 2019-12-01 LAB — CBC
HCT: 39.2 % (ref 36.0–46.0)
Hemoglobin: 13.3 g/dL (ref 12.0–15.0)
MCH: 29.4 pg (ref 26.0–34.0)
MCHC: 33.9 g/dL (ref 30.0–36.0)
MCV: 86.5 fL (ref 80.0–100.0)
Platelets: 283 10*3/uL (ref 150–400)
RBC: 4.53 MIL/uL (ref 3.87–5.11)
RDW: 14 % (ref 11.5–15.5)
WBC: 5.5 10*3/uL (ref 4.0–10.5)
nRBC: 0 % (ref 0.0–0.2)

## 2019-12-01 MED ORDER — IOHEXOL 300 MG/ML  SOLN
100.0000 mL | Freq: Once | INTRAMUSCULAR | Status: AC | PRN
  Administered 2019-12-01: 100 mL via INTRAVENOUS

## 2019-12-01 MED ORDER — SENNOSIDES-DOCUSATE SODIUM 8.6-50 MG PO TABS: 2.0000 | ORAL_TABLET | Freq: Every day | ORAL | 0 refills | Status: AC

## 2019-12-01 MED ORDER — SODIUM CHLORIDE 0.9 % IV BOLUS
1000.0000 mL | Freq: Once | INTRAVENOUS | Status: AC
  Administered 2019-12-01: 1000 mL via INTRAVENOUS

## 2019-12-01 NOTE — Discharge Instructions (Signed)
As discussed, today's evaluation was generally reassuring.  However, there is demonstration of substantial constipation, likely contributing to your abdominal pain and change in appetite. In addition to your medication prescribed earlier in the day by your physician, your going to start a new medication to assist with bowel movements.  With otherwise reassuring studies, it is still important that you follow-up with your physician for appropriate ongoing management of your thyroid dysfunction, today's episode of lightheadedness, and your constipation.  For additional assistance with sleeping, please use 5 mg of melatonin, approximately 30 minutes before bedtime which should be the same each evening.

## 2019-12-01 NOTE — ED Triage Notes (Addendum)
Pt BIBA from home. Pt c/o dizziness with a near syncopal episode. Pt has been weaker than normal as well. Pt has lost 25-30 lbs in 1 month. Pt recently dx with hypothyroidism (yesterday)- not on meds. Dizziness worse with movement. No orthostatic changes with EMS. Pt also states that she has had some abd pain, had emesis x 1 while eating burrito,but was able to finish it.
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
## 2019-12-01 NOTE — ED Notes (Addendum)
Patient unable to sign for d/c due to computer malfunction

## 2019-12-02 ENCOUNTER — Ambulatory Visit: Payer: Medicare HMO | Admitting: Surgery

## 2019-12-02 DIAGNOSIS — R69 Illness, unspecified: Secondary | ICD-10-CM | POA: Diagnosis not present

## 2019-12-08 ENCOUNTER — Inpatient Hospital Stay: Admission: RE | Admit: 2019-12-08 | Payer: Medicare HMO | Source: Ambulatory Visit

## 2019-12-10 DIAGNOSIS — H5709 Other anomalies of pupillary function: Secondary | ICD-10-CM | POA: Diagnosis not present

## 2019-12-10 DIAGNOSIS — H25811 Combined forms of age-related cataract, right eye: Secondary | ICD-10-CM | POA: Diagnosis not present

## 2019-12-10 DIAGNOSIS — H25041 Posterior subcapsular polar age-related cataract, right eye: Secondary | ICD-10-CM | POA: Diagnosis not present

## 2019-12-10 DIAGNOSIS — H2511 Age-related nuclear cataract, right eye: Secondary | ICD-10-CM | POA: Diagnosis not present

## 2019-12-10 DIAGNOSIS — H25011 Cortical age-related cataract, right eye: Secondary | ICD-10-CM | POA: Diagnosis not present

## 2019-12-11 ENCOUNTER — Telehealth: Payer: Self-pay | Admitting: Family

## 2019-12-11 NOTE — Telephone Encounter (Signed)
    Patient requesting referral to a specialist to discuss issues she is having with sleep Advised patient to keep 9/20 to discuss.

## 2019-12-12 ENCOUNTER — Other Ambulatory Visit: Payer: Self-pay | Admitting: Family

## 2019-12-13 DIAGNOSIS — R69 Illness, unspecified: Secondary | ICD-10-CM | POA: Diagnosis not present

## 2019-12-13 DIAGNOSIS — C7931 Secondary malignant neoplasm of brain: Secondary | ICD-10-CM | POA: Diagnosis not present

## 2019-12-13 DIAGNOSIS — G8220 Paraplegia, unspecified: Secondary | ICD-10-CM | POA: Diagnosis not present

## 2019-12-13 DIAGNOSIS — E119 Type 2 diabetes mellitus without complications: Secondary | ICD-10-CM | POA: Diagnosis not present

## 2019-12-13 DIAGNOSIS — E785 Hyperlipidemia, unspecified: Secondary | ICD-10-CM | POA: Diagnosis not present

## 2019-12-13 DIAGNOSIS — E039 Hypothyroidism, unspecified: Secondary | ICD-10-CM | POA: Diagnosis not present

## 2019-12-13 DIAGNOSIS — C50919 Malignant neoplasm of unspecified site of unspecified female breast: Secondary | ICD-10-CM | POA: Diagnosis not present

## 2019-12-13 DIAGNOSIS — G3184 Mild cognitive impairment, so stated: Secondary | ICD-10-CM | POA: Diagnosis not present

## 2019-12-13 DIAGNOSIS — Z794 Long term (current) use of insulin: Secondary | ICD-10-CM | POA: Diagnosis not present

## 2019-12-13 DIAGNOSIS — I82509 Chronic embolism and thrombosis of unspecified deep veins of unspecified lower extremity: Secondary | ICD-10-CM | POA: Diagnosis not present

## 2019-12-14 NOTE — Telephone Encounter (Signed)
    Patient requesting referral for home health aide Advised to keep appointment on 9/20

## 2019-12-21 ENCOUNTER — Other Ambulatory Visit: Payer: Self-pay

## 2019-12-21 ENCOUNTER — Encounter: Payer: Self-pay | Admitting: Family

## 2019-12-21 ENCOUNTER — Ambulatory Visit (INDEPENDENT_AMBULATORY_CARE_PROVIDER_SITE_OTHER): Payer: Medicare HMO | Admitting: Family

## 2019-12-21 VITALS — BP 122/62 | HR 64 | Temp 98.3°F | Ht 64.0 in | Wt 138.0 lb

## 2019-12-21 DIAGNOSIS — R2689 Other abnormalities of gait and mobility: Secondary | ICD-10-CM

## 2019-12-21 DIAGNOSIS — F419 Anxiety disorder, unspecified: Secondary | ICD-10-CM

## 2019-12-21 DIAGNOSIS — R300 Dysuria: Secondary | ICD-10-CM | POA: Diagnosis not present

## 2019-12-21 DIAGNOSIS — E039 Hypothyroidism, unspecified: Secondary | ICD-10-CM | POA: Diagnosis not present

## 2019-12-21 DIAGNOSIS — R0681 Apnea, not elsewhere classified: Secondary | ICD-10-CM | POA: Diagnosis not present

## 2019-12-21 DIAGNOSIS — F329 Major depressive disorder, single episode, unspecified: Secondary | ICD-10-CM

## 2019-12-21 DIAGNOSIS — Z23 Encounter for immunization: Secondary | ICD-10-CM | POA: Diagnosis not present

## 2019-12-21 DIAGNOSIS — Z794 Long term (current) use of insulin: Secondary | ICD-10-CM | POA: Diagnosis not present

## 2019-12-21 DIAGNOSIS — E1169 Type 2 diabetes mellitus with other specified complication: Secondary | ICD-10-CM

## 2019-12-21 DIAGNOSIS — E876 Hypokalemia: Secondary | ICD-10-CM | POA: Diagnosis not present

## 2019-12-21 DIAGNOSIS — F32A Depression, unspecified: Secondary | ICD-10-CM

## 2019-12-21 DIAGNOSIS — R69 Illness, unspecified: Secondary | ICD-10-CM | POA: Diagnosis not present

## 2019-12-21 MED ORDER — TRAZODONE HCL 50 MG PO TABS
25.0000 mg | ORAL_TABLET | Freq: Every evening | ORAL | 3 refills | Status: DC | PRN
Start: 2019-12-21 — End: 2020-01-13

## 2019-12-21 NOTE — Addendum Note (Signed)
Addended by: Cresenciano Lick on: 12/21/2019 10:27 AM   Modules accepted: Orders

## 2019-12-21 NOTE — Patient Instructions (Signed)
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
## 2019-12-22 LAB — BASIC METABOLIC PANEL
BUN: 10 mg/dL (ref 7–25)
CO2: 29 mmol/L (ref 20–32)
Calcium: 10.6 mg/dL — ABNORMAL HIGH (ref 8.6–10.4)
Chloride: 103 mmol/L (ref 98–110)
Creat: 0.55 mg/dL (ref 0.50–0.99)
Glucose, Bld: 96 mg/dL (ref 65–99)
Potassium: 3.9 mmol/L (ref 3.5–5.3)
Sodium: 142 mmol/L (ref 135–146)

## 2019-12-22 LAB — URINALYSIS
Bilirubin Urine: NEGATIVE
Glucose, UA: NEGATIVE
Hgb urine dipstick: NEGATIVE
Ketones, ur: NEGATIVE
Nitrite: NEGATIVE
Specific Gravity, Urine: 1.02 (ref 1.001–1.03)
pH: 7 (ref 5.0–8.0)

## 2019-12-22 LAB — URINE CULTURE

## 2019-12-22 LAB — TSH: TSH: 2.66 mIU/L (ref 0.40–4.50)

## 2019-12-23 ENCOUNTER — Other Ambulatory Visit: Payer: Self-pay | Admitting: Family

## 2019-12-24 DIAGNOSIS — E039 Hypothyroidism, unspecified: Secondary | ICD-10-CM | POA: Diagnosis not present

## 2019-12-24 DIAGNOSIS — Z7901 Long term (current) use of anticoagulants: Secondary | ICD-10-CM | POA: Diagnosis not present

## 2019-12-24 DIAGNOSIS — Z794 Long term (current) use of insulin: Secondary | ICD-10-CM | POA: Diagnosis not present

## 2019-12-24 DIAGNOSIS — E119 Type 2 diabetes mellitus without complications: Secondary | ICD-10-CM | POA: Diagnosis not present

## 2019-12-24 DIAGNOSIS — K219 Gastro-esophageal reflux disease without esophagitis: Secondary | ICD-10-CM | POA: Diagnosis not present

## 2019-12-24 DIAGNOSIS — R300 Dysuria: Secondary | ICD-10-CM | POA: Diagnosis not present

## 2019-12-24 DIAGNOSIS — K760 Fatty (change of) liver, not elsewhere classified: Secondary | ICD-10-CM | POA: Diagnosis not present

## 2019-12-24 DIAGNOSIS — Z853 Personal history of malignant neoplasm of breast: Secondary | ICD-10-CM | POA: Diagnosis not present

## 2019-12-25 DIAGNOSIS — K760 Fatty (change of) liver, not elsewhere classified: Secondary | ICD-10-CM | POA: Diagnosis not present

## 2019-12-25 DIAGNOSIS — K219 Gastro-esophageal reflux disease without esophagitis: Secondary | ICD-10-CM | POA: Diagnosis not present

## 2019-12-25 DIAGNOSIS — Z853 Personal history of malignant neoplasm of breast: Secondary | ICD-10-CM | POA: Diagnosis not present

## 2019-12-25 DIAGNOSIS — R300 Dysuria: Secondary | ICD-10-CM | POA: Diagnosis not present

## 2019-12-25 DIAGNOSIS — Z794 Long term (current) use of insulin: Secondary | ICD-10-CM | POA: Diagnosis not present

## 2019-12-25 DIAGNOSIS — E039 Hypothyroidism, unspecified: Secondary | ICD-10-CM | POA: Diagnosis not present

## 2019-12-25 DIAGNOSIS — E119 Type 2 diabetes mellitus without complications: Secondary | ICD-10-CM | POA: Diagnosis not present

## 2019-12-25 DIAGNOSIS — Z7901 Long term (current) use of anticoagulants: Secondary | ICD-10-CM | POA: Diagnosis not present

## 2019-12-28 DIAGNOSIS — Z7901 Long term (current) use of anticoagulants: Secondary | ICD-10-CM | POA: Diagnosis not present

## 2019-12-28 DIAGNOSIS — Z794 Long term (current) use of insulin: Secondary | ICD-10-CM | POA: Diagnosis not present

## 2019-12-28 DIAGNOSIS — K760 Fatty (change of) liver, not elsewhere classified: Secondary | ICD-10-CM | POA: Diagnosis not present

## 2019-12-28 DIAGNOSIS — E119 Type 2 diabetes mellitus without complications: Secondary | ICD-10-CM | POA: Diagnosis not present

## 2019-12-28 DIAGNOSIS — E039 Hypothyroidism, unspecified: Secondary | ICD-10-CM | POA: Diagnosis not present

## 2019-12-28 DIAGNOSIS — K219 Gastro-esophageal reflux disease without esophagitis: Secondary | ICD-10-CM | POA: Diagnosis not present

## 2019-12-28 DIAGNOSIS — R300 Dysuria: Secondary | ICD-10-CM | POA: Diagnosis not present

## 2019-12-28 DIAGNOSIS — Z853 Personal history of malignant neoplasm of breast: Secondary | ICD-10-CM | POA: Diagnosis not present

## 2019-12-31 DIAGNOSIS — R300 Dysuria: Secondary | ICD-10-CM | POA: Diagnosis not present

## 2019-12-31 DIAGNOSIS — K760 Fatty (change of) liver, not elsewhere classified: Secondary | ICD-10-CM | POA: Diagnosis not present

## 2019-12-31 DIAGNOSIS — Z794 Long term (current) use of insulin: Secondary | ICD-10-CM | POA: Diagnosis not present

## 2019-12-31 DIAGNOSIS — K219 Gastro-esophageal reflux disease without esophagitis: Secondary | ICD-10-CM | POA: Diagnosis not present

## 2019-12-31 DIAGNOSIS — Z7901 Long term (current) use of anticoagulants: Secondary | ICD-10-CM | POA: Diagnosis not present

## 2019-12-31 DIAGNOSIS — Z853 Personal history of malignant neoplasm of breast: Secondary | ICD-10-CM | POA: Diagnosis not present

## 2019-12-31 DIAGNOSIS — E039 Hypothyroidism, unspecified: Secondary | ICD-10-CM | POA: Diagnosis not present

## 2019-12-31 DIAGNOSIS — E119 Type 2 diabetes mellitus without complications: Secondary | ICD-10-CM | POA: Diagnosis not present

## 2020-01-01 DIAGNOSIS — E119 Type 2 diabetes mellitus without complications: Secondary | ICD-10-CM | POA: Diagnosis not present

## 2020-01-01 DIAGNOSIS — R300 Dysuria: Secondary | ICD-10-CM | POA: Diagnosis not present

## 2020-01-01 DIAGNOSIS — Z853 Personal history of malignant neoplasm of breast: Secondary | ICD-10-CM | POA: Diagnosis not present

## 2020-01-01 DIAGNOSIS — E039 Hypothyroidism, unspecified: Secondary | ICD-10-CM | POA: Diagnosis not present

## 2020-01-01 DIAGNOSIS — K760 Fatty (change of) liver, not elsewhere classified: Secondary | ICD-10-CM | POA: Diagnosis not present

## 2020-01-01 DIAGNOSIS — Z7901 Long term (current) use of anticoagulants: Secondary | ICD-10-CM | POA: Diagnosis not present

## 2020-01-01 DIAGNOSIS — K219 Gastro-esophageal reflux disease without esophagitis: Secondary | ICD-10-CM | POA: Diagnosis not present

## 2020-01-01 DIAGNOSIS — Z794 Long term (current) use of insulin: Secondary | ICD-10-CM | POA: Diagnosis not present

## 2020-01-05 DIAGNOSIS — K219 Gastro-esophageal reflux disease without esophagitis: Secondary | ICD-10-CM | POA: Diagnosis not present

## 2020-01-05 DIAGNOSIS — K760 Fatty (change of) liver, not elsewhere classified: Secondary | ICD-10-CM | POA: Diagnosis not present

## 2020-01-05 DIAGNOSIS — Z7901 Long term (current) use of anticoagulants: Secondary | ICD-10-CM | POA: Diagnosis not present

## 2020-01-05 DIAGNOSIS — R300 Dysuria: Secondary | ICD-10-CM | POA: Diagnosis not present

## 2020-01-05 DIAGNOSIS — E119 Type 2 diabetes mellitus without complications: Secondary | ICD-10-CM | POA: Diagnosis not present

## 2020-01-05 DIAGNOSIS — Z853 Personal history of malignant neoplasm of breast: Secondary | ICD-10-CM | POA: Diagnosis not present

## 2020-01-05 DIAGNOSIS — Z794 Long term (current) use of insulin: Secondary | ICD-10-CM | POA: Diagnosis not present

## 2020-01-05 DIAGNOSIS — E039 Hypothyroidism, unspecified: Secondary | ICD-10-CM | POA: Diagnosis not present

## 2020-01-06 ENCOUNTER — Ambulatory Visit: Payer: Medicare HMO | Admitting: Nurse Practitioner

## 2020-01-06 ENCOUNTER — Encounter: Payer: Self-pay | Admitting: Nurse Practitioner

## 2020-01-06 VITALS — BP 126/72 | HR 64 | Ht 64.0 in | Wt 141.0 lb

## 2020-01-06 DIAGNOSIS — K5909 Other constipation: Secondary | ICD-10-CM

## 2020-01-06 DIAGNOSIS — R112 Nausea with vomiting, unspecified: Secondary | ICD-10-CM | POA: Diagnosis not present

## 2020-01-06 NOTE — Progress Notes (Signed)
ASSESSMENT AND PLAN    # Dysphagia, resolved after starting Synthroid.   # Nausea and vomiting --Doesn't sound like blood sugars have been running high but still consider delayed gastric emptying as source of N/V. Metoclopramide helped in the past but Tasha Ortiz stopped taking it when symptoms got better.   --Symptoms could also be secondary to Zoloft started last month.  --Tasha Ortiz has a hx of breast cancer with brain mets but MRI in March showed only post treatment changes and no new brain lesions.  --Recommend Tasha Ortiz resume Reglan before meals since it helped in the past.  --Maintain good glycemic --Small frequent meals  --Follow up in a few weeks. If having ongoing symptoms then consider EGD.  # Chronic constipation --High fiber diet. Will avoid fiber supplements in case Tasha Ortiz goes have delayed gastric emptying.  --48-64 oz water daily --If no improvement at follow up then will add Miralax   # History PE, on Xarelto   HISTORY OF PRESENT ILLNESS     Primary Gastroenterologist : Previously Dr. Deatra Ina Chief Complaint : nausea and vomiting  Tasha Ortiz is a 64 y.o. female with PMH / [place] significant for,  but not necessarily limited to: metastatic breast cancer, PE on Xarelto, HTN, polyarthralgia, hepatic steatosis, DM  Rheagan was last seen by Dr. Deatra Ina in 2016. Tasha Ortiz has a history ofs breast cancer with brain mets. Tasha Ortiz is s/p right mastectomy, adjuvant chemotherapy and radiation. Currently treated with anastrozole. Restaging CT scan in March 2021 showed no evidence of metastatic disease in chest, abdomen or pelvis. Brain MRI showed stable post treatment changes in posterior fossa.   Patient was referred by PCP for dysphagia but says that swallowing problems resolved over a month ago after starting thyroid medication. No GERD symptoms.    Patient says Tasha Ortiz is here for stomach problems. Tasha Ortiz has been having nausea and vomiting. Tasha Ortiz feels nauseated 3-4 times a week and vomits once a week,  generally after a meal. Emesis consists of food. Tasha Ortiz doesn't use THC. I reviewed notes in Epic and nausea / vomiting has been going on for months. Oncology addressed it back in April 2021 and started Tasha Ortiz on Reglan. Tasha Ortiz said the Reglan helped but when symptoms improved Tasha Ortiz stopped the medication and hasn't ever resumed it. Tasha Ortiz lost about 20 pounds between March 2021 and August 2021 but Tasha Ortiz weight has since been stable and Tasha Ortiz has actually gained a few pounds.  No NSAID use.   Patient was seen in the ED 12/01/2019 for LLQ pain, nausea and vomiting.  CT scan with contrast showed moderate stool in the colon.  CBC was normal. Patient told in ED that Tasha Ortiz had a bowel blockage. Patient endorses constipation. Tasha Ortiz has decreased frequency of BMs with hard stool. No blood in stool.   Previous Endoscopic Evaluations / Pertinent Studies:  2016 colonoscopy with intubation of the terminal ileum for LLQ pain. --Terminal ileum was normal. --Colonoscopy normal except for hemorrhoids. --Biopsies of the terminal ileum and random colon biopsies negative  Past Medical History:  Diagnosis Date   Acid reflux    Allergy    seasonal   Breast cancer ([place])    right,no iv or blood on right sid, chemo and radiation 2014   Family history of brain cancer    Family history of breast cancer    Heartburn    Hypercholesteremia    taken off chol meds Dec 2012   Hypertension    Personal history of chemotherapy 05/2012  rt breast   Personal history of radiation therapy 10/2012   rt breast     Past Surgical History:  Procedure Laterality Date   BREAST BIOPSY Right 04/16/2012   BREAST SURGERY Right 05/20/12   Rt br mastectomy   CESAREAN SECTION  25 years ago   colonoscopy     COLONOSCOPY WITH PROPOFOL N/A 01/18/2015   Procedure: COLONOSCOPY WITH PROPOFOL;  Surgeon: Mauri Pole, MD;  [facility];  Service: Endoscopy;  Laterality: N/A;   MASTECTOMY Right 05/2012   MODIFIED MASTECTOMY  Right 05/20/2012   Procedure: MODIFIED MASTECTOMY;  Surgeon: Edward Jolly, MD;  Location: [facility];  Service: General;  Laterality: Right;   PORTACATH PLACEMENT Left 05/20/2012   Procedure: INSERTION PORT-A-CATH;  Surgeon: Edward Jolly, MD;  Location: [facility];  Service: General;  Laterality: Left;   SUBOCCIPITAL CRANIECTOMY CERVICAL LAMINECTOMY N/A 11/21/2016   Procedure: SUBOCCIPITAL CRANIECTOMY RESECTION TUMOR;  Surgeon: Newman Pies, MD;  Location: [facility];  Service: Neurosurgery;  Laterality: N/A;   TUBAL LIGATION     Family History  Problem Relation Age of Onset   Hypertension Mother    Aneurysm Mother 57   Brain cancer Sister 87   Breast cancer Cousin 31   Jaundice Father 62   Diabetes Daughter    Breast cancer Cousin 4   Breast cancer Cousin 37   Colon cancer Neg Hx    Social History   Tobacco Use   Smoking status: Never Smoker   Smokeless tobacco: Former Systems developer    Types: Chew  Substance Use Topics   Alcohol use: No    Alcohol/week: 0.0 standard drinks   Drug use: No   Current Outpatient Medications  Medication Sig Dispense Refill   amLODipine (NORVASC) 10 MG tablet Take 1 tablet (10 mg total) by mouth daily. 90 tablet 3   anastrozole (ARIMIDEX) 1 MG tablet Take 1 tablet (1 mg total) by mouth daily. 90 tablet 3   blood glucose meter kit and supplies KIT Dispense based on patient and insurance preference. Use up to four times daily as directed. (FOR ICD-9 250.00, 250.01). 1 each 0   dorzolamide-timolol (COSOPT) 22.3-6.8 MG/ML ophthalmic solution      Insulin Glargine (BASAGLAR KWIKPEN) 100 UNIT/ML SOPN Inject 0.2 mLs (20 Units total) into the skin at bedtime. (Patient taking differently: Inject 30 Units into the skin at bedtime. ) 15 mL 1   Insulin Pen Needle 32G X 6 MM MISC Inject daily as directed 100 each 1   KLOR-CON M20 20 MEQ tablet TAKE 1 TABLET BY MOUTH EVERY DAY 90 tablet 1   Lancets (ONETOUCH DELICA PLUS [id number]) MISC USE TO  check blood sugar UP TO 4 TIMES DAILY     levothyroxine (SYNTHROID) 25 MCG tablet TAKE 1 TABLET BY MOUTH DAILY BEFORE BREAKFAST. 30 tablet 6   metoCLOPramide (REGLAN) 5 MG tablet 1 tab twice daily before breakfast and supper. May increase to QID (before meals and at bedtime) if needed. 120 tablet 2   moxifloxacin (VIGAMOX) 0.5 % ophthalmic solution Place into the right eye.     ONETOUCH ULTRA test strip      pravastatin (PRAVACHOL) 40 MG tablet Take 1 tablet (40 mg total) by mouth daily. 90 tablet 3   prednisoLONE acetate (PRED FORTE) 1 % ophthalmic suspension Place 1 drop into the right eye 4 (four) times daily.     sertraline (ZOLOFT) 50 MG tablet Take 1 tablet (50 mg total) by mouth daily. START WITH 1/2 TABLET  X 1 WEEK THEN INCREASE TO 1 TAB ONE PER DAY 90 tablet 1   tobramycin (TOBREX) 0.3 % ophthalmic solution Place 1 drop into the left eye 4 (four) times daily. For Cataract.     traZODone (DESYREL) 50 MG tablet Take 0.5-1 tablets (25-50 mg total) by mouth at bedtime as needed for sleep. 30 tablet 3   XARELTO 20 MG TABS tablet TAKE 1 TABLET BY MOUTH EVERY DAY WITH SUPPER 30 tablet 1   No current facility-administered medications for this visit.   Allergies  Allergen Reactions   Lisinopril Swelling     Review of Systems: Positive for vision changes, depression, sleeping problems.  All other systems reviewed and negative except where noted in HPI.   PHYSICAL EXAM :    Wt Readings from Last 3 Encounters:  01/06/20 141 lb (64 kg)  12/21/19 138 lb (62.6 kg)  12/01/19 138 lb (62.6 kg)    BP 126/72    Pulse 64    Ht 5' 4"  (1.626 m)    Wt 141 lb (64 kg)    LMP 08/16/2012    BMI 24.20 kg/m  Constitutional:  Pleasant female in no acute distress. Psychiatric: Normal mood and affect. Behavior is normal. EENT: Pupils normal.  Conjunctivae are normal. No scleral icterus. Neck supple.  Cardiovascular: Normal rate, regular rhythm. No edema Pulmonary/chest: Effort normal and  breath sounds normal. No wheezing, rales or rhonchi. Abdominal: Soft, nondistended, nontender. Bowel sounds active throughout. There are no masses palpable. No hepatomegaly. Neurological: Alert and oriented to person place and time. Skin: Skin is warm and dry. No rashes noted.  Tye Savoy, NP  01/06/2020, 8:48 AM  I spent 40 minutes total reviewing records, obtaining history, performing exam, counseling patient and documenting visit / findings.    Cc:  Referring Provider [name],*

## 2020-01-06 NOTE — Patient Instructions (Signed)
If you are age 62 or older, your body mass index should be between 23-30. Your Body mass index is 24.2 kg/m. If this is out of the aforementioned range listed, please consider follow up with your Primary Care Provider.  If you are age 56 or younger, your body mass index should be between 19-25. Your Body mass index is 24.2 kg/m. If this is out of the aformentioned range listed, please consider follow up with your Primary Care Provider.   Restart Reglan 5 mg 1 tablet 30 minutes before meals.  Increase water to 48-64 ounces daily.  High Fiber diet handout provided.

## 2020-01-09 DIAGNOSIS — E119 Type 2 diabetes mellitus without complications: Secondary | ICD-10-CM | POA: Diagnosis not present

## 2020-01-09 DIAGNOSIS — K760 Fatty (change of) liver, not elsewhere classified: Secondary | ICD-10-CM | POA: Diagnosis not present

## 2020-01-09 DIAGNOSIS — Z794 Long term (current) use of insulin: Secondary | ICD-10-CM | POA: Diagnosis not present

## 2020-01-09 DIAGNOSIS — R300 Dysuria: Secondary | ICD-10-CM | POA: Diagnosis not present

## 2020-01-09 DIAGNOSIS — Z853 Personal history of malignant neoplasm of breast: Secondary | ICD-10-CM | POA: Diagnosis not present

## 2020-01-09 DIAGNOSIS — K219 Gastro-esophageal reflux disease without esophagitis: Secondary | ICD-10-CM | POA: Diagnosis not present

## 2020-01-09 DIAGNOSIS — Z7901 Long term (current) use of anticoagulants: Secondary | ICD-10-CM | POA: Diagnosis not present

## 2020-01-09 DIAGNOSIS — E039 Hypothyroidism, unspecified: Secondary | ICD-10-CM | POA: Diagnosis not present

## 2020-01-12 ENCOUNTER — Other Ambulatory Visit: Payer: Self-pay | Admitting: Family

## 2020-01-13 DIAGNOSIS — R69 Illness, unspecified: Secondary | ICD-10-CM | POA: Diagnosis not present

## 2020-01-14 ENCOUNTER — Encounter: Payer: Self-pay | Admitting: Family

## 2020-01-14 DIAGNOSIS — Z853 Personal history of malignant neoplasm of breast: Secondary | ICD-10-CM | POA: Diagnosis not present

## 2020-01-14 DIAGNOSIS — Z7901 Long term (current) use of anticoagulants: Secondary | ICD-10-CM | POA: Diagnosis not present

## 2020-01-14 DIAGNOSIS — K219 Gastro-esophageal reflux disease without esophagitis: Secondary | ICD-10-CM | POA: Diagnosis not present

## 2020-01-14 DIAGNOSIS — E119 Type 2 diabetes mellitus without complications: Secondary | ICD-10-CM | POA: Diagnosis not present

## 2020-01-14 DIAGNOSIS — R300 Dysuria: Secondary | ICD-10-CM | POA: Diagnosis not present

## 2020-01-14 DIAGNOSIS — Z794 Long term (current) use of insulin: Secondary | ICD-10-CM | POA: Diagnosis not present

## 2020-01-14 DIAGNOSIS — K760 Fatty (change of) liver, not elsewhere classified: Secondary | ICD-10-CM | POA: Diagnosis not present

## 2020-01-14 DIAGNOSIS — E039 Hypothyroidism, unspecified: Secondary | ICD-10-CM | POA: Diagnosis not present

## 2020-01-15 DIAGNOSIS — H34832 Tributary (branch) retinal vein occlusion, left eye, with macular edema: Secondary | ICD-10-CM | POA: Diagnosis not present

## 2020-01-15 DIAGNOSIS — H34811 Central retinal vein occlusion, right eye, with macular edema: Secondary | ICD-10-CM | POA: Diagnosis not present

## 2020-01-20 ENCOUNTER — Encounter: Payer: Self-pay | Admitting: Hematology and Oncology

## 2020-01-20 DIAGNOSIS — E039 Hypothyroidism, unspecified: Secondary | ICD-10-CM | POA: Diagnosis not present

## 2020-01-20 DIAGNOSIS — R300 Dysuria: Secondary | ICD-10-CM | POA: Diagnosis not present

## 2020-01-20 DIAGNOSIS — Z7901 Long term (current) use of anticoagulants: Secondary | ICD-10-CM | POA: Diagnosis not present

## 2020-01-20 DIAGNOSIS — Z853 Personal history of malignant neoplasm of breast: Secondary | ICD-10-CM | POA: Diagnosis not present

## 2020-01-20 DIAGNOSIS — K219 Gastro-esophageal reflux disease without esophagitis: Secondary | ICD-10-CM | POA: Diagnosis not present

## 2020-01-20 DIAGNOSIS — Z794 Long term (current) use of insulin: Secondary | ICD-10-CM | POA: Diagnosis not present

## 2020-01-20 DIAGNOSIS — E119 Type 2 diabetes mellitus without complications: Secondary | ICD-10-CM | POA: Diagnosis not present

## 2020-01-20 DIAGNOSIS — K760 Fatty (change of) liver, not elsewhere classified: Secondary | ICD-10-CM | POA: Diagnosis not present

## 2020-01-21 NOTE — Progress Notes (Signed)
Reviewed and agree with documentation and assessment and plan. K. Veena Keely Drennan , MD   

## 2020-01-22 DIAGNOSIS — E1165 Type 2 diabetes mellitus with hyperglycemia: Secondary | ICD-10-CM | POA: Diagnosis not present

## 2020-01-22 DIAGNOSIS — H34832 Tributary (branch) retinal vein occlusion, left eye, with macular edema: Secondary | ICD-10-CM | POA: Diagnosis not present

## 2020-01-22 DIAGNOSIS — Z794 Long term (current) use of insulin: Secondary | ICD-10-CM | POA: Diagnosis not present

## 2020-01-22 DIAGNOSIS — H34811 Central retinal vein occlusion, right eye, with macular edema: Secondary | ICD-10-CM | POA: Diagnosis not present

## 2020-01-22 DIAGNOSIS — H43811 Vitreous degeneration, right eye: Secondary | ICD-10-CM | POA: Diagnosis not present

## 2020-01-22 DIAGNOSIS — H3561 Retinal hemorrhage, right eye: Secondary | ICD-10-CM | POA: Diagnosis not present

## 2020-01-26 DIAGNOSIS — Z853 Personal history of malignant neoplasm of breast: Secondary | ICD-10-CM | POA: Diagnosis not present

## 2020-01-26 DIAGNOSIS — E119 Type 2 diabetes mellitus without complications: Secondary | ICD-10-CM | POA: Diagnosis not present

## 2020-01-26 DIAGNOSIS — Z794 Long term (current) use of insulin: Secondary | ICD-10-CM | POA: Diagnosis not present

## 2020-01-26 DIAGNOSIS — E039 Hypothyroidism, unspecified: Secondary | ICD-10-CM | POA: Diagnosis not present

## 2020-01-26 DIAGNOSIS — Z7901 Long term (current) use of anticoagulants: Secondary | ICD-10-CM | POA: Diagnosis not present

## 2020-01-26 DIAGNOSIS — K219 Gastro-esophageal reflux disease without esophagitis: Secondary | ICD-10-CM | POA: Diagnosis not present

## 2020-01-26 DIAGNOSIS — K760 Fatty (change of) liver, not elsewhere classified: Secondary | ICD-10-CM | POA: Diagnosis not present

## 2020-01-26 DIAGNOSIS — R300 Dysuria: Secondary | ICD-10-CM | POA: Diagnosis not present

## 2020-02-02 DIAGNOSIS — R69 Illness, unspecified: Secondary | ICD-10-CM | POA: Diagnosis not present

## 2020-02-03 DIAGNOSIS — Z853 Personal history of malignant neoplasm of breast: Secondary | ICD-10-CM | POA: Diagnosis not present

## 2020-02-03 DIAGNOSIS — E119 Type 2 diabetes mellitus without complications: Secondary | ICD-10-CM | POA: Diagnosis not present

## 2020-02-03 DIAGNOSIS — K760 Fatty (change of) liver, not elsewhere classified: Secondary | ICD-10-CM | POA: Diagnosis not present

## 2020-02-03 DIAGNOSIS — Z794 Long term (current) use of insulin: Secondary | ICD-10-CM | POA: Diagnosis not present

## 2020-02-03 DIAGNOSIS — E039 Hypothyroidism, unspecified: Secondary | ICD-10-CM | POA: Diagnosis not present

## 2020-02-03 DIAGNOSIS — K219 Gastro-esophageal reflux disease without esophagitis: Secondary | ICD-10-CM | POA: Diagnosis not present

## 2020-02-03 DIAGNOSIS — R300 Dysuria: Secondary | ICD-10-CM | POA: Diagnosis not present

## 2020-02-03 DIAGNOSIS — Z7901 Long term (current) use of anticoagulants: Secondary | ICD-10-CM | POA: Diagnosis not present

## 2020-02-05 ENCOUNTER — Other Ambulatory Visit: Payer: Self-pay | Admitting: Radiation Therapy

## 2020-02-10 ENCOUNTER — Other Ambulatory Visit: Payer: Self-pay | Admitting: Family

## 2020-02-11 DIAGNOSIS — K219 Gastro-esophageal reflux disease without esophagitis: Secondary | ICD-10-CM | POA: Diagnosis not present

## 2020-02-11 DIAGNOSIS — K760 Fatty (change of) liver, not elsewhere classified: Secondary | ICD-10-CM | POA: Diagnosis not present

## 2020-02-11 DIAGNOSIS — Z853 Personal history of malignant neoplasm of breast: Secondary | ICD-10-CM | POA: Diagnosis not present

## 2020-02-11 DIAGNOSIS — Z794 Long term (current) use of insulin: Secondary | ICD-10-CM | POA: Diagnosis not present

## 2020-02-11 DIAGNOSIS — R300 Dysuria: Secondary | ICD-10-CM | POA: Diagnosis not present

## 2020-02-11 DIAGNOSIS — E119 Type 2 diabetes mellitus without complications: Secondary | ICD-10-CM | POA: Diagnosis not present

## 2020-02-11 DIAGNOSIS — E039 Hypothyroidism, unspecified: Secondary | ICD-10-CM | POA: Diagnosis not present

## 2020-02-11 DIAGNOSIS — Z7901 Long term (current) use of anticoagulants: Secondary | ICD-10-CM | POA: Diagnosis not present

## 2020-02-14 ENCOUNTER — Encounter: Payer: Self-pay | Admitting: Hematology and Oncology

## 2020-02-17 DIAGNOSIS — Z853 Personal history of malignant neoplasm of breast: Secondary | ICD-10-CM | POA: Diagnosis not present

## 2020-02-17 DIAGNOSIS — R300 Dysuria: Secondary | ICD-10-CM | POA: Diagnosis not present

## 2020-02-17 DIAGNOSIS — K219 Gastro-esophageal reflux disease without esophagitis: Secondary | ICD-10-CM | POA: Diagnosis not present

## 2020-02-17 DIAGNOSIS — Z794 Long term (current) use of insulin: Secondary | ICD-10-CM | POA: Diagnosis not present

## 2020-02-17 DIAGNOSIS — Z7901 Long term (current) use of anticoagulants: Secondary | ICD-10-CM | POA: Diagnosis not present

## 2020-02-17 DIAGNOSIS — K760 Fatty (change of) liver, not elsewhere classified: Secondary | ICD-10-CM | POA: Diagnosis not present

## 2020-02-17 DIAGNOSIS — E039 Hypothyroidism, unspecified: Secondary | ICD-10-CM | POA: Diagnosis not present

## 2020-02-17 DIAGNOSIS — E119 Type 2 diabetes mellitus without complications: Secondary | ICD-10-CM | POA: Diagnosis not present

## 2020-02-22 DIAGNOSIS — R300 Dysuria: Secondary | ICD-10-CM | POA: Diagnosis not present

## 2020-02-22 DIAGNOSIS — K760 Fatty (change of) liver, not elsewhere classified: Secondary | ICD-10-CM | POA: Diagnosis not present

## 2020-02-22 DIAGNOSIS — Z794 Long term (current) use of insulin: Secondary | ICD-10-CM | POA: Diagnosis not present

## 2020-02-22 DIAGNOSIS — E039 Hypothyroidism, unspecified: Secondary | ICD-10-CM | POA: Diagnosis not present

## 2020-02-22 DIAGNOSIS — Z7901 Long term (current) use of anticoagulants: Secondary | ICD-10-CM | POA: Diagnosis not present

## 2020-02-22 DIAGNOSIS — K219 Gastro-esophageal reflux disease without esophagitis: Secondary | ICD-10-CM | POA: Diagnosis not present

## 2020-02-22 DIAGNOSIS — E119 Type 2 diabetes mellitus without complications: Secondary | ICD-10-CM | POA: Diagnosis not present

## 2020-02-22 DIAGNOSIS — Z853 Personal history of malignant neoplasm of breast: Secondary | ICD-10-CM | POA: Diagnosis not present

## 2020-02-29 DIAGNOSIS — E119 Type 2 diabetes mellitus without complications: Secondary | ICD-10-CM | POA: Diagnosis not present

## 2020-02-29 DIAGNOSIS — Z7901 Long term (current) use of anticoagulants: Secondary | ICD-10-CM | POA: Diagnosis not present

## 2020-02-29 DIAGNOSIS — K219 Gastro-esophageal reflux disease without esophagitis: Secondary | ICD-10-CM | POA: Diagnosis not present

## 2020-02-29 DIAGNOSIS — R300 Dysuria: Secondary | ICD-10-CM | POA: Diagnosis not present

## 2020-02-29 DIAGNOSIS — Z794 Long term (current) use of insulin: Secondary | ICD-10-CM | POA: Diagnosis not present

## 2020-02-29 DIAGNOSIS — Z853 Personal history of malignant neoplasm of breast: Secondary | ICD-10-CM | POA: Diagnosis not present

## 2020-02-29 DIAGNOSIS — E039 Hypothyroidism, unspecified: Secondary | ICD-10-CM | POA: Diagnosis not present

## 2020-02-29 DIAGNOSIS — K760 Fatty (change of) liver, not elsewhere classified: Secondary | ICD-10-CM | POA: Diagnosis not present

## 2020-03-04 DIAGNOSIS — H34811 Central retinal vein occlusion, right eye, with macular edema: Secondary | ICD-10-CM | POA: Diagnosis not present

## 2020-03-04 DIAGNOSIS — H34832 Tributary (branch) retinal vein occlusion, left eye, with macular edema: Secondary | ICD-10-CM | POA: Diagnosis not present

## 2020-03-12 DIAGNOSIS — E039 Hypothyroidism, unspecified: Secondary | ICD-10-CM | POA: Diagnosis not present

## 2020-03-12 DIAGNOSIS — Z7901 Long term (current) use of anticoagulants: Secondary | ICD-10-CM | POA: Diagnosis not present

## 2020-03-12 DIAGNOSIS — Z794 Long term (current) use of insulin: Secondary | ICD-10-CM | POA: Diagnosis not present

## 2020-03-12 DIAGNOSIS — K760 Fatty (change of) liver, not elsewhere classified: Secondary | ICD-10-CM | POA: Diagnosis not present

## 2020-03-12 DIAGNOSIS — R300 Dysuria: Secondary | ICD-10-CM | POA: Diagnosis not present

## 2020-03-12 DIAGNOSIS — Z853 Personal history of malignant neoplasm of breast: Secondary | ICD-10-CM | POA: Diagnosis not present

## 2020-03-12 DIAGNOSIS — K219 Gastro-esophageal reflux disease without esophagitis: Secondary | ICD-10-CM | POA: Diagnosis not present

## 2020-03-12 DIAGNOSIS — E119 Type 2 diabetes mellitus without complications: Secondary | ICD-10-CM | POA: Diagnosis not present

## 2020-03-15 DIAGNOSIS — Z794 Long term (current) use of insulin: Secondary | ICD-10-CM | POA: Diagnosis not present

## 2020-03-15 DIAGNOSIS — K219 Gastro-esophageal reflux disease without esophagitis: Secondary | ICD-10-CM | POA: Diagnosis not present

## 2020-03-15 DIAGNOSIS — Z853 Personal history of malignant neoplasm of breast: Secondary | ICD-10-CM | POA: Diagnosis not present

## 2020-03-15 DIAGNOSIS — R300 Dysuria: Secondary | ICD-10-CM | POA: Diagnosis not present

## 2020-03-15 DIAGNOSIS — E039 Hypothyroidism, unspecified: Secondary | ICD-10-CM | POA: Diagnosis not present

## 2020-03-15 DIAGNOSIS — E119 Type 2 diabetes mellitus without complications: Secondary | ICD-10-CM | POA: Diagnosis not present

## 2020-03-15 DIAGNOSIS — Z7901 Long term (current) use of anticoagulants: Secondary | ICD-10-CM | POA: Diagnosis not present

## 2020-03-15 DIAGNOSIS — K760 Fatty (change of) liver, not elsewhere classified: Secondary | ICD-10-CM | POA: Diagnosis not present

## 2020-03-18 ENCOUNTER — Ambulatory Visit
Admission: RE | Admit: 2020-03-18 | Discharge: 2020-03-18 | Disposition: A | Discharge status: Home / Self Care | Payer: Medicare HMO | Source: Ambulatory Visit | Attending: Internal Medicine | Admitting: Internal Medicine

## 2020-03-18 ENCOUNTER — Other Ambulatory Visit: Payer: Self-pay

## 2020-03-18 DIAGNOSIS — C7931 Secondary malignant neoplasm of brain: Secondary | ICD-10-CM

## 2020-03-18 DIAGNOSIS — Z853 Personal history of malignant neoplasm of breast: Secondary | ICD-10-CM | POA: Diagnosis not present

## 2020-03-18 DIAGNOSIS — Z9889 Other specified postprocedural states: Secondary | ICD-10-CM | POA: Diagnosis not present

## 2020-03-18 DIAGNOSIS — G9389 Other specified disorders of brain: Secondary | ICD-10-CM | POA: Diagnosis not present

## 2020-03-18 MED ORDER — GADOBENATE DIMEGLUMINE 529 MG/ML IV SOLN
13.0000 mL | Freq: Once | INTRAVENOUS | Status: AC | PRN
  Administered 2020-03-18: 13:00:00 13 mL via INTRAVENOUS

## 2020-03-21 ENCOUNTER — Inpatient Hospital Stay: Payer: Medicare Other

## 2020-03-21 ENCOUNTER — Other Ambulatory Visit: Payer: Self-pay

## 2020-03-21 ENCOUNTER — Inpatient Hospital Stay: Payer: Medicare Other | Attending: Internal Medicine | Admitting: Internal Medicine

## 2020-03-21 VITALS — BP 146/87 | HR 75 | Temp 97.8°F | Resp 18 | Ht 64.0 in | Wt 143.4 lb

## 2020-03-21 DIAGNOSIS — Z803 Family history of malignant neoplasm of breast: Secondary | ICD-10-CM | POA: Diagnosis not present

## 2020-03-21 DIAGNOSIS — Z853 Personal history of malignant neoplasm of breast: Secondary | ICD-10-CM | POA: Diagnosis not present

## 2020-03-21 DIAGNOSIS — Z923 Personal history of irradiation: Secondary | ICD-10-CM | POA: Insufficient documentation

## 2020-03-21 DIAGNOSIS — Z7901 Long term (current) use of anticoagulants: Secondary | ICD-10-CM | POA: Diagnosis not present

## 2020-03-21 DIAGNOSIS — Z9221 Personal history of antineoplastic chemotherapy: Secondary | ICD-10-CM | POA: Insufficient documentation

## 2020-03-21 DIAGNOSIS — Z9011 Acquired absence of right breast and nipple: Secondary | ICD-10-CM | POA: Diagnosis not present

## 2020-03-21 DIAGNOSIS — Z808 Family history of malignant neoplasm of other organs or systems: Secondary | ICD-10-CM | POA: Diagnosis not present

## 2020-03-21 DIAGNOSIS — R300 Dysuria: Secondary | ICD-10-CM | POA: Diagnosis not present

## 2020-03-21 DIAGNOSIS — K760 Fatty (change of) liver, not elsewhere classified: Secondary | ICD-10-CM | POA: Diagnosis not present

## 2020-03-21 DIAGNOSIS — Z170 Estrogen receptor positive status [ER+]: Secondary | ICD-10-CM | POA: Insufficient documentation

## 2020-03-21 DIAGNOSIS — Z8249 Family history of ischemic heart disease and other diseases of the circulatory system: Secondary | ICD-10-CM | POA: Diagnosis not present

## 2020-03-21 DIAGNOSIS — Z79899 Other long term (current) drug therapy: Secondary | ICD-10-CM | POA: Insufficient documentation

## 2020-03-21 DIAGNOSIS — E039 Hypothyroidism, unspecified: Secondary | ICD-10-CM | POA: Diagnosis not present

## 2020-03-21 DIAGNOSIS — Z8349 Family history of other endocrine, nutritional and metabolic diseases: Secondary | ICD-10-CM | POA: Diagnosis not present

## 2020-03-21 DIAGNOSIS — K219 Gastro-esophageal reflux disease without esophagitis: Secondary | ICD-10-CM | POA: Insufficient documentation

## 2020-03-21 DIAGNOSIS — C7931 Secondary malignant neoplasm of brain: Secondary | ICD-10-CM | POA: Diagnosis not present

## 2020-03-21 DIAGNOSIS — Z833 Family history of diabetes mellitus: Secondary | ICD-10-CM | POA: Diagnosis not present

## 2020-03-21 DIAGNOSIS — C50411 Malignant neoplasm of upper-outer quadrant of right female breast: Secondary | ICD-10-CM | POA: Insufficient documentation

## 2020-03-21 DIAGNOSIS — Z794 Long term (current) use of insulin: Secondary | ICD-10-CM | POA: Diagnosis not present

## 2020-03-21 DIAGNOSIS — E119 Type 2 diabetes mellitus without complications: Secondary | ICD-10-CM | POA: Diagnosis not present

## 2020-03-21 NOTE — Progress Notes (Signed)
[name], [place] [address] [phone]   Interval Evaluation  Date of Service: 03/21/20 Patient Name: Tasha Ortiz Patient MRN: [id number] Patient DOB: [birth year] Provider: Ventura Sellers, MD  Identifying Statement:  Tasha Ortiz is a 64 y.o. female with Brain metastasis ([place]) [C79.31]   Primary Cancer: Breast ER+/PR+/HER-  Oncologic History: Oncology History  Primary cancer of upper outer quadrant of right female breast ([place])  04/16/2012 Initial Biopsy   Right breast mass biopsy invasive ductal carcinoma with abundant mucin in all the 3 masses, right axillary lymph node biopsy positive for IDC, ER 100% PR percent Ki-67 19%, HER-2 negative ratio 0.94   04/21/2012 Breast MRI   Right breast 3 masses 11o'clock position 1.8 cm, now 11:00 position 2 cm, and 10:00 position 2.3 cm with an abnormal level I right axillary lymph node 3 cm   05/20/2012 Surgery   Right breast mastectomy invasive ductal carcinoma grade 2; 1.9 cm, 2.5 cm, 1.8 cm, 1/17 LN positive, second and third massive grade 3   06/24/2012 - 10/07/2012 Chemotherapy   Adjuvant chemotherapy with Taxotere Cytoxan x6 cycles   10/16/2012 - 12/16/2012 Radiation Therapy   Adjuvant radiation therapy by Dr. [name]   01/07/2013 -  Anti-estrogen oral therapy   Tamoxifen 20 mg daily   11/19/2016 Imaging   MRI brain: 2.9 similar mass within the inferior vermis with surrounding edema and local mass effect partially effacing the fourth ventricle.    11/21/2016 Relapse/Recurrence   Brain biopsy: Metastatic ductal carcinoma the breast positive for ER PR, GATA 3, negative for PAX8 and TTF-1   11/21/2016 Surgery   Suboccipital craniotomy for gross total resection of cerebellar tumor by Dr. [name]     Interval History:  Tasha Ortiz presents today after recent MRI brain.  She actually describes improvement with gait and coordination since prior visit.   Continues to use a walker at home and wheelchair "going out". Both have clearly been helped by aggressive physical therapy over the past months.  No issues with new back pain or urge incontinence.     H+P: presents today to review her CNS disease burden from metastatic breast cancer, and associated neurologic deficits.  She initially described feeling "off balance" this summer, which led to an MRI in August demonstrating large posterior fossa metastasis.  This was resected on 11/21/16, and followed by post-op SRS to the resection cavity.  She describes clinical stability in the post-operative period, which was followed by significant decline during immediately following radiation, described as "inability to ambulate, dress self, feed self" due to poor balance and coordination.  These deficits have reportedly improved dramatically since that time- she is still reliant on a walker, but all other ADL's she is able to perform independently.  Occasionally she has bouts of vertigo, which are effectively treated with Meclizine.   Most recent MRI in January was stable.  She does acknowledge some neck and back pain recently along midline.  No urinary incontinence.  Dr. Lindi Adie had prescribed 64m decadron daily for appetite stimulation, which she says has helped considerably and would like to continue.  Medications: Current Outpatient Medications on File Prior to Visit  Medication Sig Dispense Refill  . amLODipine (NORVASC) 10 MG tablet Take 1 tablet (10 mg total) by mouth daily. 90 tablet 3  . anastrozole (ARIMIDEX) 1 MG tablet Take 1 tablet (1 mg total) by mouth daily. 90 tablet 3  . blood  glucose meter kit and supplies KIT Dispense based on patient and insurance preference. Use up to four times daily as directed. (FOR ICD-9 250.00, 250.01). 1 each 0  . dorzolamide-timolol (COSOPT) 22.3-6.8 MG/ML ophthalmic solution     . Insulin Glargine (BASAGLAR KWIKPEN) 100 UNIT/ML SOPN Inject 0.2 mLs (20 Units total) into  the skin at bedtime. (Patient taking differently: Inject 30 Units into the skin at bedtime. ) 15 mL 1  . Insulin Pen Needle 32G X 6 MM MISC Inject daily as directed 100 each 1  . KLOR-CON M20 20 MEQ tablet TAKE 1 TABLET BY MOUTH EVERY DAY 90 tablet 1  . Lancets (ONETOUCH DELICA PLUS [id number]) MISC USE TO check blood sugar UP TO 4 TIMES DAILY    . levothyroxine (SYNTHROID) 25 MCG tablet TAKE 1 TABLET BY MOUTH DAILY BEFORE BREAKFAST. 30 tablet 6  . metoCLOPramide (REGLAN) 5 MG tablet 1 tab twice daily before breakfast and supper. May increase to QID (before meals and at bedtime) if needed. 120 tablet 2  . moxifloxacin (VIGAMOX) 0.5 % ophthalmic solution Place into the right eye.    Glory Rosebush ULTRA test strip     . pravastatin (PRAVACHOL) 40 MG tablet Take 1 tablet (40 mg total) by mouth daily. 90 tablet 3  . prednisoLONE acetate (PRED FORTE) 1 % ophthalmic suspension Place 1 drop into the right eye 4 (four) times daily.    . sertraline (ZOLOFT) 50 MG tablet Take 1 tablet (50 mg total) by mouth daily. START WITH 1/2 TABLET X 1 WEEK THEN INCREASE TO 1 TAB ONE PER DAY 90 tablet 1  . tobramycin (TOBREX) 0.3 % ophthalmic solution Place 1 drop into the left eye 4 (four) times daily. For Cataract.    . traZODone (DESYREL) 50 MG tablet TAKE 0.5-1 TABLETS (25-50 MG TOTAL) BY MOUTH AT BEDTIME AS NEEDED FOR SLEEP. 90 tablet 2  . XARELTO 20 MG TABS tablet TAKE 1 TABLET BY MOUTH EVERY DAY WITH SUPPER 30 tablet 1  . [DISCONTINUED] Prochlorperazine Maleate (COMPAZINE PO) Take by mouth.     No current facility-administered medications on file prior to visit.    Allergies:  Allergies  Allergen Reactions  . Lisinopril Swelling   Past Medical History:  Past Medical History:  Diagnosis Date  . Acid reflux   . Allergy    seasonal  . Breast cancer ([place])    right,no iv or blood on right sid, chemo and radiation 2014  . Family history of brain cancer   . Family history of breast cancer   . Heartburn   .  Hypercholesteremia    taken off chol meds Dec 2012  . Hypertension   . Hypertension   . Personal history of chemotherapy 05/2012   rt breast  . Personal history of radiation therapy 10/2012   rt breast   Past Surgical History:  Past Surgical History:  Procedure Laterality Date  . BREAST BIOPSY Right 04/16/2012  . BREAST SURGERY Right 05/20/12   Rt br mastectomy  . CESAREAN SECTION  25 years ago  . colonoscopy    . COLONOSCOPY WITH PROPOFOL N/A 01/18/2015   Procedure: COLONOSCOPY WITH PROPOFOL;  Surgeon: Mauri Pole, MD;  Location: [facility];  Service: Endoscopy;  Laterality: N/A;  . MASTECTOMY Right 05/2012  . MODIFIED MASTECTOMY Right 05/20/2012   Procedure: MODIFIED MASTECTOMY;  Surgeon: Edward Jolly, MD;  Location: [facility];  Service: General;  Laterality: Right;  . PORTACATH PLACEMENT Left 05/20/2012   Procedure: INSERTION PORT-A-CATH;  Surgeon: Mariella Saa, MD;  Location: [facility];  Service: General;  Laterality: Left;  . SUBOCCIPITAL CRANIECTOMY CERVICAL LAMINECTOMY N/A 11/21/2016   Procedure: SUBOCCIPITAL CRANIECTOMY RESECTION TUMOR;  Surgeon: Tressie Stalker, MD;  Location: [facility];  Service: Neurosurgery;  Laterality: N/A;  . TUBAL LIGATION     Social History:  Social History   Socioeconomic History  . Marital status: Legally Separated    Spouse name: Not on file  . Number of children: 2  . Years of education: 46  . Highest education level: Not on file  Occupational History  . Occupation: Health visitor  Tobacco Use  . Smoking status: Never Smoker  . Smokeless tobacco: Former Neurosurgeon    Types: Chew  Substance and Sexual Activity  . Alcohol use: No    Alcohol/week: 0.0 standard drinks  . Drug use: No  . Sexual activity: Never    Comment: menarche 14, P2, no HRT  Other Topics Concern  . Not on file  Social History Narrative   Fun: [employer]   Denies religious beliefs effecting health care.    Social Determinants of Health   Financial Resource  Strain: Not on file  Food Insecurity: Not on file  Transportation Needs: Not on file  Physical Activity: Not on file  Stress: Not on file  Social Connections: Not on file  Intimate Partner Violence: Not on file   Family History:  Family History  Problem Relation Age of Onset  . Hypertension Mother   . Aneurysm Mother 83  . Brain cancer Sister 33  . Breast cancer Cousin 40  . Jaundice Father 40  . Diabetes Daughter   . Breast cancer Cousin 55  . Breast cancer Cousin 45  . Colon cancer Neg Hx     Review of Systems: Constitutional: Denies fevers, chills or abnormal weight loss Eyes: blurry distance vision Ears, nose, mouth, throat, and face: Denies mucositis or sore throat Respiratory: Denies cough, dyspnea or wheezes Cardiovascular: Denies palpitation, chest discomfort or lower extremity swelling Gastrointestinal:  Denies nausea, constipation, diarrhea GU: Denies dysuria or incontinence Skin: Denies abnormal skin rashes Neurological: Per HPI Musculoskeletal: midline back pain Behavioral/Psych: Denies anxiety, disturbance in thought content, and mood instability   Physical Exam: Vitals:   03/21/20 0929  BP: (!) 146/87  Pulse: 75  Resp: 18  Temp: 97.8 F (36.6 C)  SpO2: 100%   KPS: 70. General: Alert, cooperative, pleasant, in no acute distress.  In wheelchair Head: Craniotomy scar noted, dry and intact. EENT: No conjunctival injection or scleral icterus. Oral mucosa moist Lungs: Resp effort normal Cardiac: Regular rate and rhythm Abdomen: Soft, non-distended abdomen Skin: No rashes cyanosis or petechiae. Extremities: No clubbing or edema  Neurologic Exam: Mental Status: Awake, alert, attentive to examiner. Oriented to self and environment. Language is fluent with intact comprehension.  Cranial Nerves: Visual acuity is grossly normal. Visual fields are full. Extra-ocular movements intact. No ptosis. Face is symmetric, tongue midline. Motor: Tone and bulk are  normal. Power is full in both arms and legs. Hyperreflexia noted in bilateral legs (3+) with sustained clonus in right > left ankle.  Arm reflexes normal. Mild dysmetria noted on bilateral finger to nose. Sensory: Intact to light touch and temperature Gait: Dystaxic  Labs: I have reviewed the data as listed    Component Value Date/Time   NA 142 12/21/2019 1027   NA 143 07/05/2014 1101   K 3.9 12/21/2019 1027   K 3.7 07/05/2014 1101   CL 103 12/21/2019 1027  CL 103 09/23/2012 1040   CO2 29 12/21/2019 1027   CO2 27 07/05/2014 1101   GLUCOSE 96 12/21/2019 1027   GLUCOSE 113 07/05/2014 1101   GLUCOSE 140 (H) 09/23/2012 1040   BUN 10 12/21/2019 1027   BUN 11.8 07/05/2014 1101   CREATININE 0.55 12/21/2019 1027   CREATININE 0.8 07/05/2014 1101   CALCIUM 10.6 (H) 12/21/2019 1027   CALCIUM 9.4 07/05/2014 1101   PROT 6.5 11/20/2019 1539   PROT 6.6 07/05/2014 1101   ALBUMIN 4.0 09/21/2019 0753   ALBUMIN 3.4 (L) 07/05/2014 1101   AST 13 11/20/2019 1539   AST 40 07/15/2019 0825   AST 18 07/05/2014 1101   ALT 9 11/20/2019 1539   ALT 39 07/15/2019 0825   ALT 15 07/05/2014 1101   ALKPHOS 51 09/21/2019 0753   ALKPHOS 45 07/05/2014 1101   BILITOT 0.3 11/20/2019 1539   BILITOT 0.4 07/15/2019 0825   BILITOT 0.21 07/05/2014 1101   GFRNONAA >60 12/01/2019 1248   GFRNONAA >60 07/15/2019 0825   GFRAA >60 12/01/2019 1248   GFRAA >60 07/15/2019 0825   Lab Results  Component Value Date   WBC 5.5 12/01/2019   NEUTROABS 2,849 11/20/2019   HGB 13.3 12/01/2019   HCT 39.2 12/01/2019   MCV 86.5 12/01/2019   PLT 283 12/01/2019    Imaging:  [facility] Clinician Interpretation: I have personally reviewed the CNS images as listed.  My interpretation, in the context of the patient's clinical presentation, is stable disease  MR BRAIN W WO CONTRAST  Result Date: 03/18/2020 CLINICAL DATA:  Metastatic breast cancer. Postop craniotomy and SRS for cerebellar metastatic deposit. EXAM: MRI HEAD WITHOUT AND  WITH CONTRAST TECHNIQUE: Multiplanar, multiecho pulse sequences of the brain and surrounding structures were obtained without and with intravenous contrast. CONTRAST:  26m MULTIHANCE GADOBENATE DIMEGLUMINE 529 MG/ML IV SOLN COMPARISON:  MRI head 06/17/2019, 12/23/2017 FINDINGS: Brain: Suboccipital craniotomy for tumor resection. Suboccipital pseudomeningocele measures 24 x 38 mm unchanged. There has been resection of the inferior cerebellar vermis. Chronic hemosiderin noted at the resection site unchanged. There is enlargement of the fourth ventricle with scarring and retraction in the posterior cerebellum bilaterally. There is mild intrinsic T1 hyperintensity in the cerebellar tonsils bilaterally similar to prior studies including 2019. There may be some slight enhancement in this area but no change from prior studies and no recurrent tumor identified. No new metastatic deposits identified. Ventricle size normal. Scattered white matter hyperintensities bilaterally stable from the prior study. No acute infarct. Vascular: Normal arterial flow voids. Skull and upper cervical spine: Suboccipital craniectomy. No skeletal metastasis identified. Sinuses/Orbits: Mild mucosal edema paranasal sinuses. Bilateral cataract extraction Other: None IMPRESSION: Stable MRI post resection of posterior fossa tumor. No new or recurrent metastatic disease. Electronically Signed   By: CFranchot GalloM.D.   On: 03/18/2020 16:28    Assessment/Plan Brain metastasis ([facility]  Ms. MWaddingtonis clinically and radiographically stable today.    Will continue neuro-focused physical therapy for her, given its prior impact on her balance and coordination.  We appreciate the opportunity to participate in the care of BMila Ortiz    We ask that BMila Palmerreturn to clinic in 12 months following next brain MRI, or sooner as needed.  All questions were answered. The patient knows to call the clinic with any problems, questions  or concerns. No barriers to learning were detected.  I have spent a total of 40 minutes of face-to-face and non-face-to-face time, excluding clinical staff time, preparing  to see patient, ordering tests and/or medications, counseling the patient, and independently interpreting results and communicating results to the patient/family/caregiver    Ventura Sellers, MD Medical Director of Neuro-Oncology [facility] at [facility] 03/21/20 9:18 AM

## 2020-03-30 DIAGNOSIS — Z853 Personal history of malignant neoplasm of breast: Secondary | ICD-10-CM | POA: Diagnosis not present

## 2020-03-30 DIAGNOSIS — Z7901 Long term (current) use of anticoagulants: Secondary | ICD-10-CM | POA: Diagnosis not present

## 2020-03-30 DIAGNOSIS — K219 Gastro-esophageal reflux disease without esophagitis: Secondary | ICD-10-CM | POA: Diagnosis not present

## 2020-03-30 DIAGNOSIS — R300 Dysuria: Secondary | ICD-10-CM | POA: Diagnosis not present

## 2020-03-30 DIAGNOSIS — E039 Hypothyroidism, unspecified: Secondary | ICD-10-CM | POA: Diagnosis not present

## 2020-03-30 DIAGNOSIS — Z794 Long term (current) use of insulin: Secondary | ICD-10-CM | POA: Diagnosis not present

## 2020-03-30 DIAGNOSIS — E119 Type 2 diabetes mellitus without complications: Secondary | ICD-10-CM | POA: Diagnosis not present

## 2020-03-30 DIAGNOSIS — K760 Fatty (change of) liver, not elsewhere classified: Secondary | ICD-10-CM | POA: Diagnosis not present

## 2020-04-06 DIAGNOSIS — H34811 Central retinal vein occlusion, right eye, with macular edema: Secondary | ICD-10-CM | POA: Diagnosis not present

## 2020-04-06 DIAGNOSIS — R6889 Other general symptoms and signs: Secondary | ICD-10-CM | POA: Diagnosis not present

## 2020-04-06 DIAGNOSIS — H3561 Retinal hemorrhage, right eye: Secondary | ICD-10-CM | POA: Diagnosis not present

## 2020-04-06 DIAGNOSIS — H43811 Vitreous degeneration, right eye: Secondary | ICD-10-CM | POA: Diagnosis not present

## 2020-04-06 DIAGNOSIS — H34832 Tributary (branch) retinal vein occlusion, left eye, with macular edema: Secondary | ICD-10-CM | POA: Diagnosis not present

## 2020-04-25 ENCOUNTER — Ambulatory Visit: Payer: Medicare HMO | Admitting: Family

## 2020-04-29 ENCOUNTER — Other Ambulatory Visit: Payer: Self-pay | Admitting: Hematology and Oncology

## 2020-05-04 DIAGNOSIS — Z7984 Long term (current) use of oral hypoglycemic drugs: Secondary | ICD-10-CM | POA: Diagnosis not present

## 2020-05-04 DIAGNOSIS — Z794 Long term (current) use of insulin: Secondary | ICD-10-CM | POA: Diagnosis not present

## 2020-05-04 DIAGNOSIS — E119 Type 2 diabetes mellitus without complications: Secondary | ICD-10-CM | POA: Diagnosis not present

## 2020-05-04 DIAGNOSIS — E1165 Type 2 diabetes mellitus with hyperglycemia: Secondary | ICD-10-CM | POA: Diagnosis not present

## 2020-05-06 ENCOUNTER — Telehealth (INDEPENDENT_AMBULATORY_CARE_PROVIDER_SITE_OTHER): Payer: Medicare Other | Admitting: Family

## 2020-05-06 ENCOUNTER — Telehealth: Payer: Self-pay | Admitting: Family

## 2020-05-06 ENCOUNTER — Other Ambulatory Visit: Payer: Self-pay | Admitting: Family

## 2020-05-06 DIAGNOSIS — F419 Anxiety disorder, unspecified: Secondary | ICD-10-CM

## 2020-05-06 DIAGNOSIS — E039 Hypothyroidism, unspecified: Secondary | ICD-10-CM | POA: Diagnosis not present

## 2020-05-06 DIAGNOSIS — E782 Mixed hyperlipidemia: Secondary | ICD-10-CM

## 2020-05-06 DIAGNOSIS — H34832 Tributary (branch) retinal vein occlusion, left eye, with macular edema: Secondary | ICD-10-CM | POA: Diagnosis not present

## 2020-05-06 DIAGNOSIS — M79622 Pain in left upper arm: Secondary | ICD-10-CM

## 2020-05-06 DIAGNOSIS — R2689 Other abnormalities of gait and mobility: Secondary | ICD-10-CM

## 2020-05-06 DIAGNOSIS — R6889 Other general symptoms and signs: Secondary | ICD-10-CM | POA: Diagnosis not present

## 2020-05-06 DIAGNOSIS — F32A Depression, unspecified: Secondary | ICD-10-CM

## 2020-05-06 DIAGNOSIS — H43811 Vitreous degeneration, right eye: Secondary | ICD-10-CM | POA: Diagnosis not present

## 2020-05-06 DIAGNOSIS — M79602 Pain in left arm: Secondary | ICD-10-CM | POA: Diagnosis not present

## 2020-05-06 DIAGNOSIS — H3561 Retinal hemorrhage, right eye: Secondary | ICD-10-CM | POA: Diagnosis not present

## 2020-05-06 DIAGNOSIS — H34811 Central retinal vein occlusion, right eye, with macular edema: Secondary | ICD-10-CM | POA: Diagnosis not present

## 2020-05-06 MED ORDER — LEVOTHYROXINE SODIUM 25 MCG PO TABS: ORAL_TABLET | ORAL | 6 refills | Status: DC

## 2020-05-06 MED ORDER — PRAVASTATIN SODIUM 40 MG PO TABS: 40.0000 mg | ORAL_TABLET | Freq: Every day | ORAL | 3 refills | Status: DC

## 2020-05-06 NOTE — Telephone Encounter (Signed)
Patient said she used to be seen by [facility] and the number is [phone]. Stated you told her to call back with the name and number
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
## 2020-05-25 ENCOUNTER — Other Ambulatory Visit: Payer: Self-pay | Admitting: Family

## 2020-05-27 ENCOUNTER — Other Ambulatory Visit: Payer: Self-pay | Admitting: Hematology and Oncology

## 2020-05-30 ENCOUNTER — Other Ambulatory Visit: Payer: Self-pay | Admitting: Family

## 2020-05-30 DIAGNOSIS — M79602 Pain in left arm: Secondary | ICD-10-CM

## 2020-05-30 DIAGNOSIS — R2689 Other abnormalities of gait and mobility: Secondary | ICD-10-CM

## 2020-06-01 ENCOUNTER — Other Ambulatory Visit: Payer: Self-pay | Admitting: Family

## 2020-06-01 DIAGNOSIS — E119 Type 2 diabetes mellitus without complications: Secondary | ICD-10-CM | POA: Diagnosis not present

## 2020-06-01 DIAGNOSIS — F32A Depression, unspecified: Secondary | ICD-10-CM | POA: Diagnosis not present

## 2020-06-01 DIAGNOSIS — Z86711 Personal history of pulmonary embolism: Secondary | ICD-10-CM | POA: Diagnosis not present

## 2020-06-01 DIAGNOSIS — Z7901 Long term (current) use of anticoagulants: Secondary | ICD-10-CM | POA: Diagnosis not present

## 2020-06-01 DIAGNOSIS — K219 Gastro-esophageal reflux disease without esophagitis: Secondary | ICD-10-CM | POA: Diagnosis not present

## 2020-06-01 DIAGNOSIS — C50411 Malignant neoplasm of upper-outer quadrant of right female breast: Secondary | ICD-10-CM | POA: Diagnosis not present

## 2020-06-01 DIAGNOSIS — Z85841 Personal history of malignant neoplasm of brain: Secondary | ICD-10-CM | POA: Diagnosis not present

## 2020-06-01 DIAGNOSIS — E039 Hypothyroidism, unspecified: Secondary | ICD-10-CM | POA: Diagnosis not present

## 2020-06-01 DIAGNOSIS — Z8673 Personal history of transient ischemic attack (TIA), and cerebral infarction without residual deficits: Secondary | ICD-10-CM | POA: Diagnosis not present

## 2020-06-01 DIAGNOSIS — J309 Allergic rhinitis, unspecified: Secondary | ICD-10-CM | POA: Diagnosis not present

## 2020-06-01 DIAGNOSIS — K760 Fatty (change of) liver, not elsewhere classified: Secondary | ICD-10-CM | POA: Diagnosis not present

## 2020-06-01 DIAGNOSIS — Z981 Arthrodesis status: Secondary | ICD-10-CM | POA: Diagnosis not present

## 2020-06-01 DIAGNOSIS — Z87891 Personal history of nicotine dependence: Secondary | ICD-10-CM | POA: Diagnosis not present

## 2020-06-01 DIAGNOSIS — Z853 Personal history of malignant neoplasm of breast: Secondary | ICD-10-CM | POA: Diagnosis not present

## 2020-06-01 DIAGNOSIS — Z9181 History of falling: Secondary | ICD-10-CM | POA: Diagnosis not present

## 2020-06-01 DIAGNOSIS — C7931 Secondary malignant neoplasm of brain: Secondary | ICD-10-CM | POA: Diagnosis not present

## 2020-06-01 DIAGNOSIS — I10 Essential (primary) hypertension: Secondary | ICD-10-CM | POA: Diagnosis not present

## 2020-06-01 DIAGNOSIS — E782 Mixed hyperlipidemia: Secondary | ICD-10-CM | POA: Diagnosis not present

## 2020-06-02 NOTE — Progress Notes (Signed)
Patient Care Team: [name], [place] as PCP - General (Internal Medicine)  DIAGNOSIS:    ICD-10-CM   1. Primary cancer of upper outer quadrant of right female breast ([place])  C50.411     SUMMARY OF ONCOLOGIC HISTORY: Oncology History  Primary cancer of upper outer quadrant of right female breast ([place])  04/16/2012 Initial Biopsy   Right breast mass biopsy invasive ductal carcinoma with abundant mucin in all the 3 masses, right axillary lymph node biopsy positive for IDC, ER 100% PR percent Ki-67 19%, HER-2 negative ratio 0.94   04/21/2012 Breast MRI   Right breast 3 masses 11o'clock position 1.8 cm, now 11:00 position 2 cm, and 10:00 position 2.3 cm with an abnormal level I right axillary lymph node 3 cm   05/20/2012 Surgery   Right breast mastectomy invasive ductal carcinoma grade 2; 1.9 cm, 2.5 cm, 1.8 cm, 1/17 LN positive, second and third massive grade 3   06/24/2012 - 10/07/2012 Chemotherapy   Adjuvant chemotherapy with Taxotere Cytoxan x6 cycles   10/16/2012 - 12/16/2012 Radiation Therapy   Adjuvant radiation therapy by Dr. [name]   01/07/2013 -  Anti-estrogen oral therapy   Tamoxifen 20 mg daily   11/19/2016 Imaging   MRI brain: 2.9 similar mass within the inferior vermis with surrounding edema and local mass effect partially effacing the fourth ventricle.    11/21/2016 Relapse/Recurrence   Brain biopsy: Metastatic ductal carcinoma the breast positive for ER PR, GATA 3, negative for PAX8 and TTF-1   11/21/2016 Surgery   Suboccipital craniotomy for gross total resection of cerebellar tumor by Dr. [name]     CHIEF COMPLIANT: Follow-up of metastatic breast cancer and bilateral PEs  INTERVAL HISTORY: Tasha Ortiz is a 65 y.o. with above-mentioned history of metastatic breast cancer with brain metastases who is followed by Dr. Mickeal Skinner. She also has a history of bilateral PEs for which she is currently on Xarelto. Brain MRI on 03/18/20 showed no new or recurrent  metastatic disease. She presents to the clinic todayfor follow-up.  She is eating well and feels well.  She uses a walker to get around.  She has chronic nasal congestion for which she uses Afrin.  ALLERGIES:  is allergic to lisinopril.  MEDICATIONS:  Current Outpatient Medications  Medication Sig Dispense Refill  . amLODipine (NORVASC) 10 MG tablet TAKE 1 TABLET BY MOUTH EVERY DAY 90 tablet 3  . anastrozole (ARIMIDEX) 1 MG tablet TAKE 1 TABLET BY MOUTH EVERY DAY 90 tablet 3  . blood glucose meter kit and supplies KIT Dispense based on patient and insurance preference. Use up to four times daily as directed. (FOR ICD-9 250.00, 250.01). 1 each 0  . dorzolamide-timolol (COSOPT) 22.3-6.8 MG/ML ophthalmic solution     . Insulin Glargine (BASAGLAR KWIKPEN) 100 UNIT/ML SOPN Inject 0.2 mLs (20 Units total) into the skin at bedtime. (Patient taking differently: Inject 30 Units into the skin at bedtime. ) 15 mL 1  . Insulin Pen Needle 32G X 6 MM MISC Inject daily as directed 100 each 1  . KLOR-CON M20 20 MEQ tablet TAKE 1 TABLET BY MOUTH EVERY DAY 90 tablet 1  . Lancets (ONETOUCH DELICA PLUS [id number]) MISC USE TO check blood sugar UP TO 4 TIMES DAILY    . levothyroxine (SYNTHROID) 25 MCG tablet TAKE 1 TABLET BY MOUTH DAILY BEFORE BREAKFAST. 30 tablet 6  . metoCLOPramide (REGLAN) 5 MG tablet 1 tab twice daily before breakfast and supper. May increase to QID (before meals  and at bedtime) if needed. 120 tablet 2  . moxifloxacin (VIGAMOX) 0.5 % ophthalmic solution Place into the right eye.    Glory Rosebush ULTRA test strip     . pravastatin (PRAVACHOL) 40 MG tablet Take 1 tablet (40 mg total) by mouth daily. 90 tablet 3  . prednisoLONE acetate (PRED FORTE) 1 % ophthalmic suspension Place 1 drop into the right eye 4 (four) times daily.    . sertraline (ZOLOFT) 50 MG tablet START WITH 1/2 TABLET X 1 WEEK THEN INCREASE TO 1 TAB ONE PER DAY 90 tablet 1  . tobramycin (TOBREX) 0.3 % ophthalmic solution Place 1  drop into the left eye 4 (four) times daily. For Cataract.    . traZODone (DESYREL) 50 MG tablet TAKE 0.5-1 TABLETS (25-50 MG TOTAL) BY MOUTH AT BEDTIME AS NEEDED FOR SLEEP. 90 tablet 2  . XARELTO 20 MG TABS tablet TAKE 1 TABLET BY MOUTH DAILY WITH SUPPER 90 tablet 1   No current facility-administered medications for this visit.    PHYSICAL EXAMINATION: ECOG PERFORMANCE STATUS: 1 - Symptomatic but completely ambulatory  Vitals:   06/03/20 0833  BP: (!) 154/83  Pulse: 68  Resp: 18  Temp: (!) 97.3 F (36.3 C)  SpO2: 100%   Filed Weights   06/03/20 0833  Weight: 141 lb 14.4 oz (64.4 kg)    BREAST: No palpable masses or nodules in either right or left breasts. No palpable axillary supraclavicular or infraclavicular adenopathy no breast tenderness or nipple discharge. (exam performed in the presence of a chaperone)  LABORATORY DATA:  I have reviewed the data as listed CMP Latest Ref Rng & Units 12/21/2019 12/01/2019 11/20/2019  Glucose 65 - 99 mg/dL 96 96 96  BUN 7 - 25 mg/dL _0 Creatinine 0.50 - 0.99 mg/dL 0.55 0.48 0.50  Sodium 135 - 146 mmol/L 142 143 138  Potassium 3.5 - 5.3 mmol/L 3.9 3.3(L) 3.9  Chloride 98 - 110 mmol/L 103 106 105  CO2 20 - 32 mmol/L _1 Calcium 8.6 - 10.4 mg/dL 10.6(H) 9.6 9.3  Total Protein 6.1 - 8.1 g/dL - - 6.5  Total Bilirubin 0.2 - 1.2 mg/dL - - 0.3  Alkaline Phos 39 - 117 U/L - - -  AST 10 - 35 U/L - - 13  ALT 6 - 29 U/L - - 9    Lab Results  Component Value Date   WBC 5.5 12/01/2019   HGB 13.3 12/01/2019   HCT 39.2 12/01/2019   MCV 86.5 12/01/2019   PLT 283 12/01/2019   NEUTROABS 2,849 11/20/2019    ASSESSMENT & PLAN:  Primary cancer of upper outer quadrant of right female breast ([place]) Metastatic breast cancer with brain metastases: Cerebellar lesion 2.9 cm in size status post gross total resection and Dr. [name] on 11/21/2016  (Right breast invasive ductal carcinoma multifocal disease ER/PR positive HER-2 negative T2,  N1, M0 stage IIB status post mastectomy, adjuvant chemotherapy and radiation now on antiestrogen therapy with tamoxifen since October 2014.)  Current treatment: Anastrozole Anastrozole toxicities: Denies any hot flashes or myalgias  CT CAP 12/01/2019: No evidence of metastatic disease.  Hepatic steatosis. There is no evidence of metastatic disease in the chest abdomen pelvis. Brain MRI 03/18/2020: Stable post resection of posterior fossa tumor.  Balance issues and lack of strength in lower extremities:  Using a walker  We will continue to monitor her with scans every 12 months and follow up after.    No orders  of the defined types were placed in this encounter.  The patient has a good understanding of the overall plan. she agrees with it. she will call with any problems that may develop before the next visit here.  Total time spent: 20 mins including face to face time and time spent for planning, charting and coordination of care  Rulon Eisenmenger, MD, MPH 06/03/2020  I, Cloyde Reams Dorshimer, am acting as scribe for Dr. Nicholas Lose.  I have reviewed the above documentation for accuracy and completeness, and I agree with the above.

## 2020-06-02 NOTE — Assessment & Plan Note (Signed)
Metastatic breast cancer with brain metastases: Cerebellar lesion 2.9 cm in size status post gross total resection and Dr. [name] on 11/21/2016  (Right breast invasive ductal carcinoma multifocal disease ER/PR positive HER-2 negative T2, N1, M0 stage IIB status post mastectomy, adjuvant chemotherapy and radiation now on antiestrogen therapy with tamoxifen since October 2014.)  Current treatment: Anastrozole Anastrozole toxicities: Denies any hot flashes or myalgias   MRI brain has been scheduled for 06/17/2019 MRI brain 12/23/2017: Progression of bilateral lower cerebellar enhancement  Fungal infection in the groin bilaterally:  completed Diflucan. Currently on Dexamethasone: encouraged her to cut it in half  CT CAP 12/01/2019: No evidence of metastatic disease.  Hepatic steatosis. There is no evidence of metastatic disease in the chest abdomen pelvis. Brain MRI 03/18/2020: Stable post resection of posterior fossa tumor.  Balance issues and lack of strength in lower extremities: PT referral for balance  We will continue to monitor her with scans every 12 months and follow up after.

## 2020-06-03 ENCOUNTER — Inpatient Hospital Stay: Payer: Medicare Other | Attending: Internal Medicine | Admitting: Hematology and Oncology

## 2020-06-03 ENCOUNTER — Other Ambulatory Visit: Payer: Self-pay

## 2020-06-03 VITALS — BP 154/83 | HR 68 | Temp 97.3°F | Resp 18 | Ht 64.0 in | Wt 141.9 lb

## 2020-06-03 DIAGNOSIS — H34832 Tributary (branch) retinal vein occlusion, left eye, with macular edema: Secondary | ICD-10-CM | POA: Diagnosis not present

## 2020-06-03 DIAGNOSIS — Z170 Estrogen receptor positive status [ER+]: Secondary | ICD-10-CM | POA: Insufficient documentation

## 2020-06-03 DIAGNOSIS — C50411 Malignant neoplasm of upper-outer quadrant of right female breast: Secondary | ICD-10-CM | POA: Insufficient documentation

## 2020-06-03 DIAGNOSIS — C7931 Secondary malignant neoplasm of brain: Secondary | ICD-10-CM | POA: Diagnosis not present

## 2020-06-03 DIAGNOSIS — Z9221 Personal history of antineoplastic chemotherapy: Secondary | ICD-10-CM | POA: Diagnosis not present

## 2020-06-03 DIAGNOSIS — Z7901 Long term (current) use of anticoagulants: Secondary | ICD-10-CM | POA: Diagnosis not present

## 2020-06-03 DIAGNOSIS — C50919 Malignant neoplasm of unspecified site of unspecified female breast: Secondary | ICD-10-CM

## 2020-06-03 DIAGNOSIS — Z79899 Other long term (current) drug therapy: Secondary | ICD-10-CM | POA: Insufficient documentation

## 2020-06-03 DIAGNOSIS — Z86711 Personal history of pulmonary embolism: Secondary | ICD-10-CM | POA: Diagnosis not present

## 2020-06-03 DIAGNOSIS — Z923 Personal history of irradiation: Secondary | ICD-10-CM | POA: Insufficient documentation

## 2020-06-03 DIAGNOSIS — H34811 Central retinal vein occlusion, right eye, with macular edema: Secondary | ICD-10-CM | POA: Diagnosis not present

## 2020-06-03 DIAGNOSIS — R0981 Nasal congestion: Secondary | ICD-10-CM | POA: Insufficient documentation

## 2020-06-03 DIAGNOSIS — Z79811 Long term (current) use of aromatase inhibitors: Secondary | ICD-10-CM | POA: Insufficient documentation

## 2020-06-03 DIAGNOSIS — H43811 Vitreous degeneration, right eye: Secondary | ICD-10-CM | POA: Diagnosis not present

## 2020-06-08 DIAGNOSIS — J309 Allergic rhinitis, unspecified: Secondary | ICD-10-CM | POA: Diagnosis not present

## 2020-06-08 DIAGNOSIS — Z87891 Personal history of nicotine dependence: Secondary | ICD-10-CM | POA: Diagnosis not present

## 2020-06-08 DIAGNOSIS — Z86711 Personal history of pulmonary embolism: Secondary | ICD-10-CM | POA: Diagnosis not present

## 2020-06-08 DIAGNOSIS — C50411 Malignant neoplasm of upper-outer quadrant of right female breast: Secondary | ICD-10-CM | POA: Diagnosis not present

## 2020-06-08 DIAGNOSIS — Z853 Personal history of malignant neoplasm of breast: Secondary | ICD-10-CM | POA: Diagnosis not present

## 2020-06-08 DIAGNOSIS — K219 Gastro-esophageal reflux disease without esophagitis: Secondary | ICD-10-CM | POA: Diagnosis not present

## 2020-06-08 DIAGNOSIS — E782 Mixed hyperlipidemia: Secondary | ICD-10-CM | POA: Diagnosis not present

## 2020-06-08 DIAGNOSIS — Z981 Arthrodesis status: Secondary | ICD-10-CM | POA: Diagnosis not present

## 2020-06-08 DIAGNOSIS — F32A Depression, unspecified: Secondary | ICD-10-CM | POA: Diagnosis not present

## 2020-06-08 DIAGNOSIS — Z7901 Long term (current) use of anticoagulants: Secondary | ICD-10-CM | POA: Diagnosis not present

## 2020-06-08 DIAGNOSIS — Z85841 Personal history of malignant neoplasm of brain: Secondary | ICD-10-CM | POA: Diagnosis not present

## 2020-06-08 DIAGNOSIS — Z9181 History of falling: Secondary | ICD-10-CM | POA: Diagnosis not present

## 2020-06-08 DIAGNOSIS — I10 Essential (primary) hypertension: Secondary | ICD-10-CM | POA: Diagnosis not present

## 2020-06-08 DIAGNOSIS — K760 Fatty (change of) liver, not elsewhere classified: Secondary | ICD-10-CM | POA: Diagnosis not present

## 2020-06-08 DIAGNOSIS — E039 Hypothyroidism, unspecified: Secondary | ICD-10-CM | POA: Diagnosis not present

## 2020-06-08 DIAGNOSIS — Z8673 Personal history of transient ischemic attack (TIA), and cerebral infarction without residual deficits: Secondary | ICD-10-CM | POA: Diagnosis not present

## 2020-06-08 DIAGNOSIS — E119 Type 2 diabetes mellitus without complications: Secondary | ICD-10-CM | POA: Diagnosis not present

## 2020-06-08 DIAGNOSIS — C7931 Secondary malignant neoplasm of brain: Secondary | ICD-10-CM | POA: Diagnosis not present

## 2020-06-09 DIAGNOSIS — Z7901 Long term (current) use of anticoagulants: Secondary | ICD-10-CM | POA: Diagnosis not present

## 2020-06-09 DIAGNOSIS — Z85841 Personal history of malignant neoplasm of brain: Secondary | ICD-10-CM | POA: Diagnosis not present

## 2020-06-09 DIAGNOSIS — I10 Essential (primary) hypertension: Secondary | ICD-10-CM | POA: Diagnosis not present

## 2020-06-09 DIAGNOSIS — J309 Allergic rhinitis, unspecified: Secondary | ICD-10-CM | POA: Diagnosis not present

## 2020-06-09 DIAGNOSIS — K219 Gastro-esophageal reflux disease without esophagitis: Secondary | ICD-10-CM | POA: Diagnosis not present

## 2020-06-09 DIAGNOSIS — Z87891 Personal history of nicotine dependence: Secondary | ICD-10-CM | POA: Diagnosis not present

## 2020-06-09 DIAGNOSIS — Z9181 History of falling: Secondary | ICD-10-CM | POA: Diagnosis not present

## 2020-06-09 DIAGNOSIS — Z981 Arthrodesis status: Secondary | ICD-10-CM | POA: Diagnosis not present

## 2020-06-09 DIAGNOSIS — Z86711 Personal history of pulmonary embolism: Secondary | ICD-10-CM | POA: Diagnosis not present

## 2020-06-09 DIAGNOSIS — C7931 Secondary malignant neoplasm of brain: Secondary | ICD-10-CM | POA: Diagnosis not present

## 2020-06-09 DIAGNOSIS — F32A Depression, unspecified: Secondary | ICD-10-CM | POA: Diagnosis not present

## 2020-06-09 DIAGNOSIS — E119 Type 2 diabetes mellitus without complications: Secondary | ICD-10-CM | POA: Diagnosis not present

## 2020-06-09 DIAGNOSIS — Z8673 Personal history of transient ischemic attack (TIA), and cerebral infarction without residual deficits: Secondary | ICD-10-CM | POA: Diagnosis not present

## 2020-06-09 DIAGNOSIS — K760 Fatty (change of) liver, not elsewhere classified: Secondary | ICD-10-CM | POA: Diagnosis not present

## 2020-06-09 DIAGNOSIS — E039 Hypothyroidism, unspecified: Secondary | ICD-10-CM | POA: Diagnosis not present

## 2020-06-09 DIAGNOSIS — E782 Mixed hyperlipidemia: Secondary | ICD-10-CM | POA: Diagnosis not present

## 2020-06-09 DIAGNOSIS — Z853 Personal history of malignant neoplasm of breast: Secondary | ICD-10-CM | POA: Diagnosis not present

## 2020-06-09 DIAGNOSIS — C50411 Malignant neoplasm of upper-outer quadrant of right female breast: Secondary | ICD-10-CM | POA: Diagnosis not present

## 2020-06-14 DIAGNOSIS — E119 Type 2 diabetes mellitus without complications: Secondary | ICD-10-CM | POA: Diagnosis not present

## 2020-06-14 DIAGNOSIS — F32A Depression, unspecified: Secondary | ICD-10-CM | POA: Diagnosis not present

## 2020-06-14 DIAGNOSIS — Z9181 History of falling: Secondary | ICD-10-CM | POA: Diagnosis not present

## 2020-06-14 DIAGNOSIS — K219 Gastro-esophageal reflux disease without esophagitis: Secondary | ICD-10-CM | POA: Diagnosis not present

## 2020-06-14 DIAGNOSIS — Z981 Arthrodesis status: Secondary | ICD-10-CM | POA: Diagnosis not present

## 2020-06-14 DIAGNOSIS — Z85841 Personal history of malignant neoplasm of brain: Secondary | ICD-10-CM | POA: Diagnosis not present

## 2020-06-14 DIAGNOSIS — Z8673 Personal history of transient ischemic attack (TIA), and cerebral infarction without residual deficits: Secondary | ICD-10-CM | POA: Diagnosis not present

## 2020-06-14 DIAGNOSIS — Z853 Personal history of malignant neoplasm of breast: Secondary | ICD-10-CM | POA: Diagnosis not present

## 2020-06-14 DIAGNOSIS — J309 Allergic rhinitis, unspecified: Secondary | ICD-10-CM | POA: Diagnosis not present

## 2020-06-14 DIAGNOSIS — I10 Essential (primary) hypertension: Secondary | ICD-10-CM | POA: Diagnosis not present

## 2020-06-14 DIAGNOSIS — C7931 Secondary malignant neoplasm of brain: Secondary | ICD-10-CM | POA: Diagnosis not present

## 2020-06-14 DIAGNOSIS — Z87891 Personal history of nicotine dependence: Secondary | ICD-10-CM | POA: Diagnosis not present

## 2020-06-14 DIAGNOSIS — E782 Mixed hyperlipidemia: Secondary | ICD-10-CM | POA: Diagnosis not present

## 2020-06-14 DIAGNOSIS — Z86711 Personal history of pulmonary embolism: Secondary | ICD-10-CM | POA: Diagnosis not present

## 2020-06-14 DIAGNOSIS — C50411 Malignant neoplasm of upper-outer quadrant of right female breast: Secondary | ICD-10-CM | POA: Diagnosis not present

## 2020-06-14 DIAGNOSIS — E039 Hypothyroidism, unspecified: Secondary | ICD-10-CM | POA: Diagnosis not present

## 2020-06-14 DIAGNOSIS — K760 Fatty (change of) liver, not elsewhere classified: Secondary | ICD-10-CM | POA: Diagnosis not present

## 2020-06-14 DIAGNOSIS — Z7901 Long term (current) use of anticoagulants: Secondary | ICD-10-CM | POA: Diagnosis not present

## 2020-06-16 DIAGNOSIS — I10 Essential (primary) hypertension: Secondary | ICD-10-CM | POA: Diagnosis not present

## 2020-06-16 DIAGNOSIS — C7931 Secondary malignant neoplasm of brain: Secondary | ICD-10-CM | POA: Diagnosis not present

## 2020-06-16 DIAGNOSIS — E119 Type 2 diabetes mellitus without complications: Secondary | ICD-10-CM | POA: Diagnosis not present

## 2020-06-16 DIAGNOSIS — E782 Mixed hyperlipidemia: Secondary | ICD-10-CM | POA: Diagnosis not present

## 2020-06-16 DIAGNOSIS — F32A Depression, unspecified: Secondary | ICD-10-CM | POA: Diagnosis not present

## 2020-06-16 DIAGNOSIS — Z7901 Long term (current) use of anticoagulants: Secondary | ICD-10-CM | POA: Diagnosis not present

## 2020-06-16 DIAGNOSIS — Z87891 Personal history of nicotine dependence: Secondary | ICD-10-CM | POA: Diagnosis not present

## 2020-06-16 DIAGNOSIS — C50411 Malignant neoplasm of upper-outer quadrant of right female breast: Secondary | ICD-10-CM | POA: Diagnosis not present

## 2020-06-16 DIAGNOSIS — Z85841 Personal history of malignant neoplasm of brain: Secondary | ICD-10-CM | POA: Diagnosis not present

## 2020-06-16 DIAGNOSIS — K760 Fatty (change of) liver, not elsewhere classified: Secondary | ICD-10-CM | POA: Diagnosis not present

## 2020-06-16 DIAGNOSIS — Z86711 Personal history of pulmonary embolism: Secondary | ICD-10-CM | POA: Diagnosis not present

## 2020-06-16 DIAGNOSIS — Z853 Personal history of malignant neoplasm of breast: Secondary | ICD-10-CM | POA: Diagnosis not present

## 2020-06-16 DIAGNOSIS — J309 Allergic rhinitis, unspecified: Secondary | ICD-10-CM | POA: Diagnosis not present

## 2020-06-16 DIAGNOSIS — K219 Gastro-esophageal reflux disease without esophagitis: Secondary | ICD-10-CM | POA: Diagnosis not present

## 2020-06-16 DIAGNOSIS — Z9181 History of falling: Secondary | ICD-10-CM | POA: Diagnosis not present

## 2020-06-16 DIAGNOSIS — Z981 Arthrodesis status: Secondary | ICD-10-CM | POA: Diagnosis not present

## 2020-06-16 DIAGNOSIS — Z8673 Personal history of transient ischemic attack (TIA), and cerebral infarction without residual deficits: Secondary | ICD-10-CM | POA: Diagnosis not present

## 2020-06-16 DIAGNOSIS — E039 Hypothyroidism, unspecified: Secondary | ICD-10-CM | POA: Diagnosis not present

## 2020-06-24 DIAGNOSIS — Z853 Personal history of malignant neoplasm of breast: Secondary | ICD-10-CM | POA: Diagnosis not present

## 2020-06-24 DIAGNOSIS — Z87891 Personal history of nicotine dependence: Secondary | ICD-10-CM | POA: Diagnosis not present

## 2020-06-24 DIAGNOSIS — C7931 Secondary malignant neoplasm of brain: Secondary | ICD-10-CM | POA: Diagnosis not present

## 2020-06-24 DIAGNOSIS — Z85841 Personal history of malignant neoplasm of brain: Secondary | ICD-10-CM | POA: Diagnosis not present

## 2020-06-24 DIAGNOSIS — E119 Type 2 diabetes mellitus without complications: Secondary | ICD-10-CM | POA: Diagnosis not present

## 2020-06-24 DIAGNOSIS — K219 Gastro-esophageal reflux disease without esophagitis: Secondary | ICD-10-CM | POA: Diagnosis not present

## 2020-06-24 DIAGNOSIS — Z7901 Long term (current) use of anticoagulants: Secondary | ICD-10-CM | POA: Diagnosis not present

## 2020-06-24 DIAGNOSIS — J309 Allergic rhinitis, unspecified: Secondary | ICD-10-CM | POA: Diagnosis not present

## 2020-06-24 DIAGNOSIS — Z86711 Personal history of pulmonary embolism: Secondary | ICD-10-CM | POA: Diagnosis not present

## 2020-06-24 DIAGNOSIS — K760 Fatty (change of) liver, not elsewhere classified: Secondary | ICD-10-CM | POA: Diagnosis not present

## 2020-06-24 DIAGNOSIS — C50411 Malignant neoplasm of upper-outer quadrant of right female breast: Secondary | ICD-10-CM | POA: Diagnosis not present

## 2020-06-24 DIAGNOSIS — E782 Mixed hyperlipidemia: Secondary | ICD-10-CM | POA: Diagnosis not present

## 2020-06-24 DIAGNOSIS — F32A Depression, unspecified: Secondary | ICD-10-CM | POA: Diagnosis not present

## 2020-06-24 DIAGNOSIS — Z981 Arthrodesis status: Secondary | ICD-10-CM | POA: Diagnosis not present

## 2020-06-24 DIAGNOSIS — Z9181 History of falling: Secondary | ICD-10-CM | POA: Diagnosis not present

## 2020-06-24 DIAGNOSIS — Z8673 Personal history of transient ischemic attack (TIA), and cerebral infarction without residual deficits: Secondary | ICD-10-CM | POA: Diagnosis not present

## 2020-06-24 DIAGNOSIS — E039 Hypothyroidism, unspecified: Secondary | ICD-10-CM | POA: Diagnosis not present

## 2020-06-24 DIAGNOSIS — I10 Essential (primary) hypertension: Secondary | ICD-10-CM | POA: Diagnosis not present

## 2020-06-29 DIAGNOSIS — Z7901 Long term (current) use of anticoagulants: Secondary | ICD-10-CM | POA: Diagnosis not present

## 2020-06-29 DIAGNOSIS — Z86711 Personal history of pulmonary embolism: Secondary | ICD-10-CM | POA: Diagnosis not present

## 2020-06-29 DIAGNOSIS — J309 Allergic rhinitis, unspecified: Secondary | ICD-10-CM | POA: Diagnosis not present

## 2020-06-29 DIAGNOSIS — E119 Type 2 diabetes mellitus without complications: Secondary | ICD-10-CM | POA: Diagnosis not present

## 2020-06-29 DIAGNOSIS — K219 Gastro-esophageal reflux disease without esophagitis: Secondary | ICD-10-CM | POA: Diagnosis not present

## 2020-06-29 DIAGNOSIS — Z87891 Personal history of nicotine dependence: Secondary | ICD-10-CM | POA: Diagnosis not present

## 2020-06-29 DIAGNOSIS — E039 Hypothyroidism, unspecified: Secondary | ICD-10-CM | POA: Diagnosis not present

## 2020-06-29 DIAGNOSIS — E782 Mixed hyperlipidemia: Secondary | ICD-10-CM | POA: Diagnosis not present

## 2020-06-29 DIAGNOSIS — Z9181 History of falling: Secondary | ICD-10-CM | POA: Diagnosis not present

## 2020-06-29 DIAGNOSIS — C7931 Secondary malignant neoplasm of brain: Secondary | ICD-10-CM | POA: Diagnosis not present

## 2020-06-29 DIAGNOSIS — C50411 Malignant neoplasm of upper-outer quadrant of right female breast: Secondary | ICD-10-CM | POA: Diagnosis not present

## 2020-06-29 DIAGNOSIS — I10 Essential (primary) hypertension: Secondary | ICD-10-CM | POA: Diagnosis not present

## 2020-06-29 DIAGNOSIS — Z853 Personal history of malignant neoplasm of breast: Secondary | ICD-10-CM | POA: Diagnosis not present

## 2020-06-29 DIAGNOSIS — Z85841 Personal history of malignant neoplasm of brain: Secondary | ICD-10-CM | POA: Diagnosis not present

## 2020-06-29 DIAGNOSIS — K760 Fatty (change of) liver, not elsewhere classified: Secondary | ICD-10-CM | POA: Diagnosis not present

## 2020-06-29 DIAGNOSIS — Z981 Arthrodesis status: Secondary | ICD-10-CM | POA: Diagnosis not present

## 2020-06-29 DIAGNOSIS — F32A Depression, unspecified: Secondary | ICD-10-CM | POA: Diagnosis not present

## 2020-06-29 DIAGNOSIS — Z8673 Personal history of transient ischemic attack (TIA), and cerebral infarction without residual deficits: Secondary | ICD-10-CM | POA: Diagnosis not present

## 2020-07-01 ENCOUNTER — Other Ambulatory Visit: Payer: Self-pay | Admitting: *Deleted

## 2020-07-01 ENCOUNTER — Encounter: Payer: Self-pay | Admitting: Hematology and Oncology

## 2020-07-01 MED ORDER — FLUTICASONE PROPIONATE 50 MCG/ACT NA SUSP: 1.0000 | Freq: Every day | NASAL | 2 refills | Status: DC

## 2020-07-05 DIAGNOSIS — H43811 Vitreous degeneration, right eye: Secondary | ICD-10-CM | POA: Diagnosis not present

## 2020-07-05 DIAGNOSIS — H34832 Tributary (branch) retinal vein occlusion, left eye, with macular edema: Secondary | ICD-10-CM | POA: Diagnosis not present

## 2020-07-05 DIAGNOSIS — H34811 Central retinal vein occlusion, right eye, with macular edema: Secondary | ICD-10-CM | POA: Diagnosis not present

## 2020-07-05 DIAGNOSIS — H3561 Retinal hemorrhage, right eye: Secondary | ICD-10-CM | POA: Diagnosis not present

## 2020-07-08 DIAGNOSIS — K219 Gastro-esophageal reflux disease without esophagitis: Secondary | ICD-10-CM | POA: Diagnosis not present

## 2020-07-08 DIAGNOSIS — Z87891 Personal history of nicotine dependence: Secondary | ICD-10-CM | POA: Diagnosis not present

## 2020-07-08 DIAGNOSIS — F32A Depression, unspecified: Secondary | ICD-10-CM | POA: Diagnosis not present

## 2020-07-08 DIAGNOSIS — Z86711 Personal history of pulmonary embolism: Secondary | ICD-10-CM | POA: Diagnosis not present

## 2020-07-08 DIAGNOSIS — Z8673 Personal history of transient ischemic attack (TIA), and cerebral infarction without residual deficits: Secondary | ICD-10-CM | POA: Diagnosis not present

## 2020-07-08 DIAGNOSIS — Z85841 Personal history of malignant neoplasm of brain: Secondary | ICD-10-CM | POA: Diagnosis not present

## 2020-07-08 DIAGNOSIS — Z9181 History of falling: Secondary | ICD-10-CM | POA: Diagnosis not present

## 2020-07-08 DIAGNOSIS — J309 Allergic rhinitis, unspecified: Secondary | ICD-10-CM | POA: Diagnosis not present

## 2020-07-08 DIAGNOSIS — E119 Type 2 diabetes mellitus without complications: Secondary | ICD-10-CM | POA: Diagnosis not present

## 2020-07-08 DIAGNOSIS — Z981 Arthrodesis status: Secondary | ICD-10-CM | POA: Diagnosis not present

## 2020-07-08 DIAGNOSIS — I10 Essential (primary) hypertension: Secondary | ICD-10-CM | POA: Diagnosis not present

## 2020-07-08 DIAGNOSIS — E782 Mixed hyperlipidemia: Secondary | ICD-10-CM | POA: Diagnosis not present

## 2020-07-08 DIAGNOSIS — C50411 Malignant neoplasm of upper-outer quadrant of right female breast: Secondary | ICD-10-CM | POA: Diagnosis not present

## 2020-07-08 DIAGNOSIS — E039 Hypothyroidism, unspecified: Secondary | ICD-10-CM | POA: Diagnosis not present

## 2020-07-08 DIAGNOSIS — K760 Fatty (change of) liver, not elsewhere classified: Secondary | ICD-10-CM | POA: Diagnosis not present

## 2020-07-08 DIAGNOSIS — C7931 Secondary malignant neoplasm of brain: Secondary | ICD-10-CM | POA: Diagnosis not present

## 2020-07-08 DIAGNOSIS — Z853 Personal history of malignant neoplasm of breast: Secondary | ICD-10-CM | POA: Diagnosis not present

## 2020-07-08 DIAGNOSIS — Z7901 Long term (current) use of anticoagulants: Secondary | ICD-10-CM | POA: Diagnosis not present

## 2020-07-13 DIAGNOSIS — Z85841 Personal history of malignant neoplasm of brain: Secondary | ICD-10-CM | POA: Diagnosis not present

## 2020-07-13 DIAGNOSIS — F32A Depression, unspecified: Secondary | ICD-10-CM | POA: Diagnosis not present

## 2020-07-13 DIAGNOSIS — Z8673 Personal history of transient ischemic attack (TIA), and cerebral infarction without residual deficits: Secondary | ICD-10-CM | POA: Diagnosis not present

## 2020-07-13 DIAGNOSIS — Z7901 Long term (current) use of anticoagulants: Secondary | ICD-10-CM | POA: Diagnosis not present

## 2020-07-13 DIAGNOSIS — C50411 Malignant neoplasm of upper-outer quadrant of right female breast: Secondary | ICD-10-CM | POA: Diagnosis not present

## 2020-07-13 DIAGNOSIS — Z86711 Personal history of pulmonary embolism: Secondary | ICD-10-CM | POA: Diagnosis not present

## 2020-07-13 DIAGNOSIS — Z9181 History of falling: Secondary | ICD-10-CM | POA: Diagnosis not present

## 2020-07-13 DIAGNOSIS — E039 Hypothyroidism, unspecified: Secondary | ICD-10-CM | POA: Diagnosis not present

## 2020-07-13 DIAGNOSIS — K760 Fatty (change of) liver, not elsewhere classified: Secondary | ICD-10-CM | POA: Diagnosis not present

## 2020-07-13 DIAGNOSIS — Z87891 Personal history of nicotine dependence: Secondary | ICD-10-CM | POA: Diagnosis not present

## 2020-07-13 DIAGNOSIS — I10 Essential (primary) hypertension: Secondary | ICD-10-CM | POA: Diagnosis not present

## 2020-07-13 DIAGNOSIS — Z981 Arthrodesis status: Secondary | ICD-10-CM | POA: Diagnosis not present

## 2020-07-13 DIAGNOSIS — J309 Allergic rhinitis, unspecified: Secondary | ICD-10-CM | POA: Diagnosis not present

## 2020-07-13 DIAGNOSIS — E782 Mixed hyperlipidemia: Secondary | ICD-10-CM | POA: Diagnosis not present

## 2020-07-13 DIAGNOSIS — C7931 Secondary malignant neoplasm of brain: Secondary | ICD-10-CM | POA: Diagnosis not present

## 2020-07-13 DIAGNOSIS — K219 Gastro-esophageal reflux disease without esophagitis: Secondary | ICD-10-CM | POA: Diagnosis not present

## 2020-07-13 DIAGNOSIS — Z853 Personal history of malignant neoplasm of breast: Secondary | ICD-10-CM | POA: Diagnosis not present

## 2020-07-13 DIAGNOSIS — E119 Type 2 diabetes mellitus without complications: Secondary | ICD-10-CM | POA: Diagnosis not present

## 2020-07-20 DIAGNOSIS — E039 Hypothyroidism, unspecified: Secondary | ICD-10-CM | POA: Diagnosis not present

## 2020-07-20 DIAGNOSIS — E782 Mixed hyperlipidemia: Secondary | ICD-10-CM | POA: Diagnosis not present

## 2020-07-20 DIAGNOSIS — Z8673 Personal history of transient ischemic attack (TIA), and cerebral infarction without residual deficits: Secondary | ICD-10-CM | POA: Diagnosis not present

## 2020-07-20 DIAGNOSIS — K760 Fatty (change of) liver, not elsewhere classified: Secondary | ICD-10-CM | POA: Diagnosis not present

## 2020-07-20 DIAGNOSIS — C7931 Secondary malignant neoplasm of brain: Secondary | ICD-10-CM | POA: Diagnosis not present

## 2020-07-20 DIAGNOSIS — J309 Allergic rhinitis, unspecified: Secondary | ICD-10-CM | POA: Diagnosis not present

## 2020-07-20 DIAGNOSIS — Z87891 Personal history of nicotine dependence: Secondary | ICD-10-CM | POA: Diagnosis not present

## 2020-07-20 DIAGNOSIS — Z853 Personal history of malignant neoplasm of breast: Secondary | ICD-10-CM | POA: Diagnosis not present

## 2020-07-20 DIAGNOSIS — F32A Depression, unspecified: Secondary | ICD-10-CM | POA: Diagnosis not present

## 2020-07-20 DIAGNOSIS — K219 Gastro-esophageal reflux disease without esophagitis: Secondary | ICD-10-CM | POA: Diagnosis not present

## 2020-07-20 DIAGNOSIS — Z86711 Personal history of pulmonary embolism: Secondary | ICD-10-CM | POA: Diagnosis not present

## 2020-07-20 DIAGNOSIS — C50411 Malignant neoplasm of upper-outer quadrant of right female breast: Secondary | ICD-10-CM | POA: Diagnosis not present

## 2020-07-20 DIAGNOSIS — Z7901 Long term (current) use of anticoagulants: Secondary | ICD-10-CM | POA: Diagnosis not present

## 2020-07-20 DIAGNOSIS — E119 Type 2 diabetes mellitus without complications: Secondary | ICD-10-CM | POA: Diagnosis not present

## 2020-07-20 DIAGNOSIS — I10 Essential (primary) hypertension: Secondary | ICD-10-CM | POA: Diagnosis not present

## 2020-07-20 DIAGNOSIS — Z981 Arthrodesis status: Secondary | ICD-10-CM | POA: Diagnosis not present

## 2020-07-20 DIAGNOSIS — Z85841 Personal history of malignant neoplasm of brain: Secondary | ICD-10-CM | POA: Diagnosis not present

## 2020-07-20 DIAGNOSIS — Z9181 History of falling: Secondary | ICD-10-CM | POA: Diagnosis not present

## 2020-07-26 DIAGNOSIS — C50911 Malignant neoplasm of unspecified site of right female breast: Secondary | ICD-10-CM | POA: Diagnosis not present

## 2020-07-27 ENCOUNTER — Encounter: Payer: Self-pay | Admitting: Family

## 2020-07-27 DIAGNOSIS — Z86711 Personal history of pulmonary embolism: Secondary | ICD-10-CM | POA: Diagnosis not present

## 2020-07-27 DIAGNOSIS — E782 Mixed hyperlipidemia: Secondary | ICD-10-CM | POA: Diagnosis not present

## 2020-07-27 DIAGNOSIS — C7931 Secondary malignant neoplasm of brain: Secondary | ICD-10-CM | POA: Diagnosis not present

## 2020-07-27 DIAGNOSIS — K219 Gastro-esophageal reflux disease without esophagitis: Secondary | ICD-10-CM | POA: Diagnosis not present

## 2020-07-27 DIAGNOSIS — Z8673 Personal history of transient ischemic attack (TIA), and cerebral infarction without residual deficits: Secondary | ICD-10-CM | POA: Diagnosis not present

## 2020-07-27 DIAGNOSIS — Z87891 Personal history of nicotine dependence: Secondary | ICD-10-CM | POA: Diagnosis not present

## 2020-07-27 DIAGNOSIS — Z7901 Long term (current) use of anticoagulants: Secondary | ICD-10-CM | POA: Diagnosis not present

## 2020-07-27 DIAGNOSIS — K760 Fatty (change of) liver, not elsewhere classified: Secondary | ICD-10-CM | POA: Diagnosis not present

## 2020-07-27 DIAGNOSIS — Z981 Arthrodesis status: Secondary | ICD-10-CM | POA: Diagnosis not present

## 2020-07-27 DIAGNOSIS — E039 Hypothyroidism, unspecified: Secondary | ICD-10-CM | POA: Diagnosis not present

## 2020-07-27 DIAGNOSIS — Z85841 Personal history of malignant neoplasm of brain: Secondary | ICD-10-CM | POA: Diagnosis not present

## 2020-07-27 DIAGNOSIS — E119 Type 2 diabetes mellitus without complications: Secondary | ICD-10-CM | POA: Diagnosis not present

## 2020-07-27 DIAGNOSIS — Z9181 History of falling: Secondary | ICD-10-CM | POA: Diagnosis not present

## 2020-07-27 DIAGNOSIS — F32A Depression, unspecified: Secondary | ICD-10-CM | POA: Diagnosis not present

## 2020-07-27 DIAGNOSIS — I10 Essential (primary) hypertension: Secondary | ICD-10-CM | POA: Diagnosis not present

## 2020-07-27 DIAGNOSIS — Z853 Personal history of malignant neoplasm of breast: Secondary | ICD-10-CM | POA: Diagnosis not present

## 2020-07-27 DIAGNOSIS — C50411 Malignant neoplasm of upper-outer quadrant of right female breast: Secondary | ICD-10-CM | POA: Diagnosis not present

## 2020-07-27 DIAGNOSIS — J309 Allergic rhinitis, unspecified: Secondary | ICD-10-CM | POA: Diagnosis not present

## 2020-07-28 ENCOUNTER — Other Ambulatory Visit: Payer: Self-pay | Admitting: Family

## 2020-07-28 MED ORDER — DICLOFENAC SODIUM 1 % EX GEL: 2.0000 g | Freq: Four times a day (QID) | CUTANEOUS | 0 refills | Status: DC

## 2020-08-10 ENCOUNTER — Other Ambulatory Visit: Payer: Self-pay | Admitting: Family

## 2020-08-17 DIAGNOSIS — H3561 Retinal hemorrhage, right eye: Secondary | ICD-10-CM | POA: Diagnosis not present

## 2020-08-17 DIAGNOSIS — H43811 Vitreous degeneration, right eye: Secondary | ICD-10-CM | POA: Diagnosis not present

## 2020-08-17 DIAGNOSIS — H34832 Tributary (branch) retinal vein occlusion, left eye, with macular edema: Secondary | ICD-10-CM | POA: Diagnosis not present

## 2020-08-17 DIAGNOSIS — H34811 Central retinal vein occlusion, right eye, with macular edema: Secondary | ICD-10-CM | POA: Diagnosis not present

## 2020-08-30 ENCOUNTER — Encounter: Payer: Self-pay | Admitting: Family

## 2020-09-21 ENCOUNTER — Encounter: Payer: Self-pay | Admitting: Family

## 2020-09-22 ENCOUNTER — Other Ambulatory Visit: Payer: Self-pay | Admitting: Family

## 2020-09-22 MED ORDER — FLUCONAZOLE 150 MG PO TABS: ORAL_TABLET | ORAL | 0 refills | Status: DC

## 2020-10-05 DIAGNOSIS — H26493 Other secondary cataract, bilateral: Secondary | ICD-10-CM | POA: Diagnosis not present

## 2020-10-05 DIAGNOSIS — H34811 Central retinal vein occlusion, right eye, with macular edema: Secondary | ICD-10-CM | POA: Diagnosis not present

## 2020-10-05 DIAGNOSIS — H43811 Vitreous degeneration, right eye: Secondary | ICD-10-CM | POA: Diagnosis not present

## 2020-10-05 DIAGNOSIS — H34832 Tributary (branch) retinal vein occlusion, left eye, with macular edema: Secondary | ICD-10-CM | POA: Diagnosis not present

## 2020-10-22 ENCOUNTER — Other Ambulatory Visit: Payer: Self-pay | Admitting: Family

## 2020-11-02 DIAGNOSIS — E119 Type 2 diabetes mellitus without complications: Secondary | ICD-10-CM | POA: Diagnosis not present

## 2020-11-02 DIAGNOSIS — Z7984 Long term (current) use of oral hypoglycemic drugs: Secondary | ICD-10-CM | POA: Diagnosis not present

## 2020-11-03 ENCOUNTER — Encounter: Payer: Self-pay | Admitting: Family

## 2020-11-04 ENCOUNTER — Other Ambulatory Visit: Payer: Self-pay | Admitting: Family

## 2020-11-04 MED ORDER — DICLOFENAC SODIUM 1 % EX GEL: 2.0000 g | Freq: Four times a day (QID) | CUTANEOUS | 0 refills | Status: DC

## 2020-11-06 ENCOUNTER — Telehealth: Payer: Medicare Other | Admitting: Physician Assistant

## 2020-11-06 DIAGNOSIS — U071 COVID-19: Secondary | ICD-10-CM | POA: Diagnosis not present

## 2020-11-06 MED ORDER — MOLNUPIRAVIR EUA 200MG CAPSULE: 4.0000 | ORAL_CAPSULE | Freq: Two times a day (BID) | ORAL | 0 refills | Status: AC

## 2020-11-06 MED ORDER — BENZONATATE 100 MG PO CAPS: 100.0000 mg | ORAL_CAPSULE | Freq: Three times a day (TID) | ORAL | 0 refills | Status: DC | PRN
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
## 2020-11-06 NOTE — Patient Instructions (Signed)
Tasha Ortiz, thank you for joining Leeanne Rio, PA-C for today's virtual visit.  While this provider is not your primary care provider (PCP), if your PCP is located in our provider database this encounter information will be shared with them immediately following your visit.  Consent: (Patient) Tasha Ortiz provided verbal consent for this virtual visit at the beginning of the encounter.  Current Medications:  Current Outpatient Medications:    benzonatate (TESSALON) 100 MG capsule, Take 1 capsule (100 mg total) by mouth 3 (three) times daily as needed for cough., Disp: 30 capsule, Rfl: 0   molnupiravir EUA 200 mg CAPS, Take 4 capsules (800 mg total) by mouth 2 (two) times daily for 5 days., Disp: 40 capsule, Rfl: 0   amLODipine (NORVASC) 10 MG tablet, TAKE 1 TABLET BY MOUTH EVERY DAY, Disp: 90 tablet, Rfl: 3   anastrozole (ARIMIDEX) 1 MG tablet, TAKE 1 TABLET BY MOUTH EVERY DAY, Disp: 90 tablet, Rfl: 3   blood glucose meter kit and supplies KIT, Dispense based on patient and insurance preference. Use up to four times daily as directed. (FOR ICD-9 250.00, 250.01)., Disp: 1 each, Rfl: 0   diclofenac Sodium (VOLTAREN) 1 % GEL, Apply 2 g topically 4 (four) times daily., Disp: 100 g, Rfl: 0   dorzolamide-timolol (COSOPT) 22.3-6.8 MG/ML ophthalmic solution, , Disp: , Rfl:    fluticasone (FLONASE) 50 MCG/ACT nasal spray, Place 1 spray into both nostrils daily., Disp: 16 g, Rfl: 2   KLOR-CON M20 20 MEQ tablet, TAKE 1 TABLET BY MOUTH EVERY DAY, Disp: 90 tablet, Rfl: 1   Lancets (ONETOUCH DELICA PLUS [id number]) MISC, USE TO check blood sugar UP TO 4 TIMES DAILY, Disp: , Rfl:    levothyroxine (SYNTHROID) 25 MCG tablet, TAKE 1 TABLET BY MOUTH EVERY DAY BEFORE BREAKFAST, Disp: 90 tablet, Rfl: 2   metoCLOPramide (REGLAN) 5 MG tablet, 1 tab twice daily before breakfast and supper. May increase to QID (before meals and at bedtime) if needed., Disp: 120 tablet, Rfl: 2   moxifloxacin  (VIGAMOX) 0.5 % ophthalmic solution, Place into the right eye., Disp: , Rfl:    ONETOUCH ULTRA test strip, , Disp: , Rfl:    pravastatin (PRAVACHOL) 40 MG tablet, Take 1 tablet (40 mg total) by mouth daily., Disp: 90 tablet, Rfl: 3   sertraline (ZOLOFT) 50 MG tablet, START WITH 1/2 TABLET X 1 WEEK THEN INCREASE TO 1 TAB ONE PER DAY, Disp: 90 tablet, Rfl: 1   tobramycin (TOBREX) 0.3 % ophthalmic solution, Place 1 drop into the left eye 4 (four) times daily. For Cataract., Disp: , Rfl:    traZODone (DESYREL) 50 MG tablet, TAKE 0.5-1 TABLETS (25-50 MG TOTAL) BY MOUTH AT BEDTIME AS NEEDED FOR SLEEP., Disp: 90 tablet, Rfl: 2   XARELTO 20 MG TABS tablet, TAKE 1 TABLET BY MOUTH DAILY WITH SUPPER, Disp: 90 tablet, Rfl: 1   Medications ordered in this encounter:  Meds ordered this encounter  Medications   molnupiravir EUA 200 mg CAPS    Sig: Take 4 capsules (800 mg total) by mouth 2 (two) times daily for 5 days.    Dispense:  40 capsule    Refill:  0    Order Specific Question:   Supervising Provider    Answer:   MILLER, BRIAN [[id number]]   benzonatate (TESSALON) 100 MG capsule    Sig: Take 1 capsule (100 mg total) by mouth 3 (three) times daily as needed for cough.    Dispense:  30  capsule    Refill:  0    Order Specific Question:   Supervising Provider    Answer:   Sabra Heck, BRIAN [[id number]]     *If you need refills on other medications prior to your next appointment, please contact your pharmacy*  Follow-Up: Call back or seek an in-person evaluation if the symptoms worsen or if the condition fails to improve as anticipated.  Other Instructions Please keep well-hydrated and get plenty of rest. Start a saline nasal rinse to flush out your nasal passages. You can use plain Mucinex to help thin congestion. If you have a humidifier, running in the bedroom at night. I want you to start OTC vitamin D3 1000 units daily, vitamin C 1000 mg daily, and a zinc supplement. Please take prescribed medications as  directed.  You have been enrolled in a MyChart symptom monitoring program. Please answer these questions daily so we can keep track of how you are doing.  You were to quarantine for 5 days from onset of your symptoms.  After day 5, if you have had no fever and you are feeling better, you can end quarantine but need to mask for an additional 5 days. After day 5 if you have a fever or are having significant symptoms, please quarantine for full 10 days.  If you note any worsening of symptoms, any significant shortness of breath or any chest pain, please seek ER evaluation ASAP.  Please do not delay care!  If you have been instructed to have an in-person evaluation today at a local Urgent Care facility, please use the link below. It will take you to a list of all of our available [facility], including address, phone number and hours of operation. Please do not delay care.  [facility]  If you or a family member do not have a primary care provider, use the link below to schedule a visit and establish care. When you choose a [facility] primary care physician or advanced practice provider, you gain a long-term partner in health. Find a Primary Care Provider  Learn more about [facility] in-office and virtual care options: [facility]

## 2020-11-16 DIAGNOSIS — H34811 Central retinal vein occlusion, right eye, with macular edema: Secondary | ICD-10-CM | POA: Diagnosis not present

## 2020-11-16 DIAGNOSIS — H43811 Vitreous degeneration, right eye: Secondary | ICD-10-CM | POA: Diagnosis not present

## 2020-11-16 DIAGNOSIS — H34832 Tributary (branch) retinal vein occlusion, left eye, with macular edema: Secondary | ICD-10-CM | POA: Diagnosis not present

## 2020-11-16 DIAGNOSIS — H26493 Other secondary cataract, bilateral: Secondary | ICD-10-CM | POA: Diagnosis not present

## 2020-12-02 ENCOUNTER — Other Ambulatory Visit: Payer: Self-pay | Admitting: Hematology and Oncology

## 2020-12-11 ENCOUNTER — Encounter: Payer: Self-pay | Admitting: Family

## 2020-12-13 NOTE — Telephone Encounter (Signed)
I have called pt and she stated that she will call us to make the appointment since her daughter is the one that brings her to the appointments.

## 2021-01-03 ENCOUNTER — Ambulatory Visit (INDEPENDENT_AMBULATORY_CARE_PROVIDER_SITE_OTHER): Payer: Medicare Other | Admitting: Family

## 2021-01-03 ENCOUNTER — Other Ambulatory Visit: Payer: Self-pay

## 2021-01-03 ENCOUNTER — Other Ambulatory Visit (HOSPITAL_COMMUNITY)
Admission: RE | Admit: 2021-01-03 | Discharge: 2021-01-03 | Disposition: A | Discharge status: Home / Self Care | Payer: Medicare Other | Source: Ambulatory Visit | Attending: Family | Admitting: Family

## 2021-01-03 VITALS — BP 100/70 | HR 67 | Temp 98.1°F | Ht 64.0 in | Wt 149.4 lb

## 2021-01-03 DIAGNOSIS — H34811 Central retinal vein occlusion, right eye, with macular edema: Secondary | ICD-10-CM | POA: Diagnosis not present

## 2021-01-03 DIAGNOSIS — Z1151 Encounter for screening for human papillomavirus (HPV): Secondary | ICD-10-CM | POA: Insufficient documentation

## 2021-01-03 DIAGNOSIS — Z01419 Encounter for gynecological examination (general) (routine) without abnormal findings: Secondary | ICD-10-CM | POA: Diagnosis present

## 2021-01-03 DIAGNOSIS — E039 Hypothyroidism, unspecified: Secondary | ICD-10-CM

## 2021-01-03 DIAGNOSIS — R1111 Vomiting without nausea: Secondary | ICD-10-CM

## 2021-01-03 DIAGNOSIS — H34832 Tributary (branch) retinal vein occlusion, left eye, with macular edema: Secondary | ICD-10-CM | POA: Diagnosis not present

## 2021-01-03 DIAGNOSIS — Z0000 Encounter for general adult medical examination without abnormal findings: Secondary | ICD-10-CM | POA: Diagnosis not present

## 2021-01-03 DIAGNOSIS — Z124 Encounter for screening for malignant neoplasm of cervix: Secondary | ICD-10-CM | POA: Insufficient documentation

## 2021-01-03 DIAGNOSIS — E785 Hyperlipidemia, unspecified: Secondary | ICD-10-CM | POA: Diagnosis not present

## 2021-01-03 DIAGNOSIS — Z1231 Encounter for screening mammogram for malignant neoplasm of breast: Secondary | ICD-10-CM

## 2021-01-03 DIAGNOSIS — H43811 Vitreous degeneration, right eye: Secondary | ICD-10-CM | POA: Diagnosis not present

## 2021-01-03 DIAGNOSIS — Z23 Encounter for immunization: Secondary | ICD-10-CM | POA: Diagnosis not present

## 2021-01-03 LAB — COMPREHENSIVE METABOLIC PANEL
ALT: 19 U/L (ref 0–35)
AST: 18 U/L (ref 0–37)
Albumin: 4.6 g/dL (ref 3.5–5.2)
Alkaline Phosphatase: 104 U/L (ref 39–117)
BUN: 11 mg/dL (ref 6–23)
CO2: 29 mEq/L (ref 19–32)
Calcium: 10.3 mg/dL (ref 8.4–10.5)
Chloride: 102 mEq/L (ref 96–112)
Creatinine, Ser: 0.6 mg/dL (ref 0.40–1.20)
GFR: 95.7 mL/min (ref >60.00)
Glucose, Bld: 163 mg/dL — ABNORMAL HIGH (ref 70–99)
Potassium: 3.7 mEq/L (ref 3.5–5.1)
Sodium: 141 mEq/L (ref 135–145)
Total Bilirubin: 0.4 mg/dL (ref 0.2–1.2)
Total Protein: 8 g/dL (ref 6.0–8.3)

## 2021-01-03 LAB — CBC WITH DIFFERENTIAL/PLATELET
Basophils Absolute: 0.1 10*3/uL (ref 0.0–0.1)
Basophils Relative: 1 % (ref 0.0–3.0)
Eosinophils Absolute: 0.1 10*3/uL (ref 0.0–0.7)
Eosinophils Relative: 2 % (ref 0.0–5.0)
HCT: 45.2 % (ref 36.0–46.0)
Hemoglobin: 14.8 g/dL (ref 12.0–15.0)
Lymphocytes Relative: 38.2 % (ref 12.0–46.0)
Lymphs Abs: 2.1 10*3/uL (ref 0.7–4.0)
MCHC: 32.7 g/dL (ref 30.0–36.0)
MCV: 88.3 fl (ref 78.0–100.0)
Monocytes Absolute: 0.4 10*3/uL (ref 0.1–1.0)
Monocytes Relative: 7.6 % (ref 3.0–12.0)
Neutro Abs: 2.8 10*3/uL (ref 1.4–7.7)
Neutrophils Relative %: 51.2 % (ref 43.0–77.0)
Platelets: 254 10*3/uL (ref 150.0–400.0)
RBC: 5.12 Mil/uL — ABNORMAL HIGH (ref 3.87–5.11)
RDW: 14 % (ref 11.5–15.5)
WBC: 5.5 10*3/uL (ref 4.0–10.5)

## 2021-01-03 LAB — LIPID PANEL
Cholesterol: 230 mg/dL — ABNORMAL HIGH (ref 0–200)
HDL: 59.4 mg/dL (ref >39.00)
LDL Cholesterol: 150 mg/dL — ABNORMAL HIGH (ref 0–99)
NonHDL: 170.2
Total CHOL/HDL Ratio: 4
Triglycerides: 103 mg/dL (ref 0.0–149.0)
VLDL: 20.6 mg/dL (ref 0.0–40.0)

## 2021-01-03 LAB — TSH: TSH: 4.6 u[IU]/mL (ref 0.35–5.50)

## 2021-01-03 MED ORDER — SERTRALINE HCL 50 MG PO TABS: ORAL_TABLET | ORAL | 3 refills | Status: DC

## 2021-01-03 MED ORDER — POTASSIUM CHLORIDE CRYS ER 10 MEQ PO TBCR: 10.0000 meq | EXTENDED_RELEASE_TABLET | Freq: Two times a day (BID) | ORAL | 3 refills | Status: DC

## 2021-01-03 MED ORDER — PRAVASTATIN SODIUM 40 MG PO TABS: 40.0000 mg | ORAL_TABLET | Freq: Every day | ORAL | 3 refills | Status: DC

## 2021-01-03 MED ORDER — METOCLOPRAMIDE HCL 5 MG PO TABS: ORAL_TABLET | ORAL | 2 refills | Status: DC
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
## 2021-01-04 LAB — CYTOLOGY - PAP
Comment: NEGATIVE
Diagnosis: NEGATIVE
High risk HPV: NEGATIVE

## 2021-02-07 DIAGNOSIS — H34811 Central retinal vein occlusion, right eye, with macular edema: Secondary | ICD-10-CM | POA: Diagnosis not present

## 2021-02-07 DIAGNOSIS — H35372 Puckering of macula, left eye: Secondary | ICD-10-CM | POA: Diagnosis not present

## 2021-02-07 DIAGNOSIS — H43811 Vitreous degeneration, right eye: Secondary | ICD-10-CM | POA: Diagnosis not present

## 2021-02-07 DIAGNOSIS — H34832 Tributary (branch) retinal vein occlusion, left eye, with macular edema: Secondary | ICD-10-CM | POA: Diagnosis not present

## 2021-02-13 ENCOUNTER — Other Ambulatory Visit: Payer: Self-pay | Admitting: Family

## 2021-03-01 ENCOUNTER — Other Ambulatory Visit: Payer: Self-pay | Admitting: Family

## 2021-03-02 ENCOUNTER — Other Ambulatory Visit: Payer: Self-pay | Admitting: Family

## 2021-03-03 ENCOUNTER — Ambulatory Visit
Admission: RE | Admit: 2021-03-03 | Discharge: 2021-03-03 | Disposition: A | Discharge status: Home / Self Care | Payer: Medicare Other | Source: Ambulatory Visit | Attending: Family | Admitting: Family

## 2021-03-03 DIAGNOSIS — Z1231 Encounter for screening mammogram for malignant neoplasm of breast: Secondary | ICD-10-CM

## 2021-03-06 ENCOUNTER — Other Ambulatory Visit: Payer: Self-pay | Admitting: Family

## 2021-03-06 DIAGNOSIS — R928 Other abnormal and inconclusive findings on diagnostic imaging of breast: Secondary | ICD-10-CM

## 2021-03-09 ENCOUNTER — Other Ambulatory Visit: Payer: Self-pay | Admitting: Radiation Therapy

## 2021-03-17 ENCOUNTER — Ambulatory Visit
Admission: RE | Admit: 2021-03-17 | Discharge: 2021-03-17 | Disposition: A | Discharge status: Home / Self Care | Payer: Medicare Other | Source: Ambulatory Visit | Attending: Internal Medicine | Admitting: Internal Medicine

## 2021-03-17 DIAGNOSIS — C7931 Secondary malignant neoplasm of brain: Secondary | ICD-10-CM

## 2021-03-17 MED ORDER — GADOBENATE DIMEGLUMINE 529 MG/ML IV SOLN
13.0000 mL | Freq: Once | INTRAVENOUS | Status: AC | PRN
  Administered 2021-03-17: 13 mL via INTRAVENOUS

## 2021-03-20 ENCOUNTER — Inpatient Hospital Stay: Payer: Medicare Other | Admitting: Internal Medicine

## 2021-03-20 ENCOUNTER — Inpatient Hospital Stay: Payer: Medicare Other | Attending: Internal Medicine

## 2021-03-20 DIAGNOSIS — Z87891 Personal history of nicotine dependence: Secondary | ICD-10-CM | POA: Insufficient documentation

## 2021-03-20 DIAGNOSIS — C7931 Secondary malignant neoplasm of brain: Secondary | ICD-10-CM | POA: Insufficient documentation

## 2021-03-20 DIAGNOSIS — I10 Essential (primary) hypertension: Secondary | ICD-10-CM | POA: Insufficient documentation

## 2021-03-20 DIAGNOSIS — C50411 Malignant neoplasm of upper-outer quadrant of right female breast: Secondary | ICD-10-CM | POA: Insufficient documentation

## 2021-03-20 DIAGNOSIS — Z170 Estrogen receptor positive status [ER+]: Secondary | ICD-10-CM | POA: Insufficient documentation

## 2021-03-23 ENCOUNTER — Inpatient Hospital Stay (HOSPITAL_BASED_OUTPATIENT_CLINIC_OR_DEPARTMENT_OTHER): Payer: Medicare Other | Admitting: Internal Medicine

## 2021-03-23 ENCOUNTER — Other Ambulatory Visit: Payer: Self-pay

## 2021-03-23 VITALS — BP 136/77 | HR 78 | Temp 97.7°F | Resp 17 | Ht 64.0 in | Wt 153.2 lb

## 2021-03-23 DIAGNOSIS — Z170 Estrogen receptor positive status [ER+]: Secondary | ICD-10-CM | POA: Diagnosis not present

## 2021-03-23 DIAGNOSIS — Z87891 Personal history of nicotine dependence: Secondary | ICD-10-CM | POA: Diagnosis not present

## 2021-03-23 DIAGNOSIS — I10 Essential (primary) hypertension: Secondary | ICD-10-CM | POA: Diagnosis not present

## 2021-03-23 DIAGNOSIS — C50411 Malignant neoplasm of upper-outer quadrant of right female breast: Secondary | ICD-10-CM | POA: Diagnosis not present

## 2021-03-23 DIAGNOSIS — C7931 Secondary malignant neoplasm of brain: Secondary | ICD-10-CM | POA: Diagnosis not present

## 2021-03-23 NOTE — Progress Notes (Signed)
[facility] at [place] [place], [place] [address] [phone]   Interval Evaluation  Date of Service: 03/23/21 Patient Name: Tasha Ortiz Patient MRN: [id number] Patient DOB: 05-24-1955 Provider: Ventura Sellers, MD  Identifying Statement:  Tasha Ortiz is a 65 y.o. female with Brain metastasis ([place]) [C79.31]   Primary Cancer: Breast ER+/PR+/HER-  Oncologic History: Oncology History  Primary cancer of upper outer quadrant of right female breast ([place])  04/16/2012 Initial Biopsy   Right breast mass biopsy invasive ductal carcinoma with abundant mucin in all the 3 masses, right axillary lymph node biopsy positive for IDC, ER 100% PR percent Ki-67 19%, HER-2 negative ratio 0.94   04/21/2012 Breast MRI   Right breast 3 masses 11o'clock position 1.8 cm, now 11:00 position 2 cm, and 10:00 position 2.3 cm with an abnormal level I right axillary lymph node 3 cm   05/20/2012 Surgery   Right breast mastectomy invasive ductal carcinoma grade 2; 1.9 cm, 2.5 cm, 1.8 cm, 1/17 LN positive, second and third massive grade 3   06/24/2012 - 10/07/2012 Chemotherapy   Adjuvant chemotherapy with Taxotere Cytoxan x6 cycles   10/16/2012 - 12/16/2012 Radiation Therapy   Adjuvant radiation therapy by Dr. [name]   01/07/2013 -  Anti-estrogen oral therapy   Tamoxifen 20 mg daily   11/19/2016 Imaging   MRI brain: 2.9 similar mass within the inferior vermis with surrounding edema and local mass effect partially effacing the fourth ventricle.    11/21/2016 Relapse/Recurrence   Brain biopsy: Metastatic ductal carcinoma the breast positive for ER PR, GATA 3, negative for PAX8 and TTF-1   11/21/2016 Surgery   Suboccipital craniotomy for gross total resection of cerebellar tumor by Dr. [name]     Interval History:  Tasha Ortiz presents today after recent MRI brain.  No changes over the past year with gait, coordination. Continues to use a walker  at home and wheelchair "going out". Both have clearly been helped by aggressive physical therapy over the past months.  No issues with new back pain or urge incontinence.     H+P: presents today to review her CNS disease burden from metastatic breast cancer, and associated neurologic deficits.  She initially described feeling "off balance" this summer, which led to an MRI in August demonstrating large posterior fossa metastasis.  This was resected on 11/21/16, and followed by post-op SRS to the resection cavity.  She describes clinical stability in the post-operative period, which was followed by significant decline during immediately following radiation, described as "inability to ambulate, dress self, feed self" due to poor balance and coordination.  These deficits have reportedly improved dramatically since that time- she is still reliant on a walker, but all other ADL's she is able to perform independently.  Occasionally she has bouts of vertigo, which are effectively treated with Meclizine.   Most recent MRI in January was stable.  She does acknowledge some neck and back pain recently along midline.  No urinary incontinence.  Dr. Lindi Adie had prescribed 14m decadron daily for appetite stimulation, which she says has helped considerably and would like to continue.  Medications: Current Outpatient Medications on File Prior to Visit  Medication Sig Dispense Refill   amLODipine (NORVASC) 10 MG tablet TAKE 1 TABLET BY MOUTH EVERY DAY 90 tablet 3   anastrozole (ARIMIDEX) 1 MG tablet TAKE 1 TABLET BY MOUTH EVERY DAY 90 tablet 3   blood glucose meter kit and supplies KIT Dispense  based on patient and insurance preference. Use up to four times daily as directed. (FOR ICD-9 250.00, 250.01). 1 each 0   diclofenac Sodium (VOLTAREN) 1 % GEL Apply 2 g topically 4 (four) times daily. 100 g 0   dorzolamide-timolol (COSOPT) 22.3-6.8 MG/ML ophthalmic solution      fluticasone (FLONASE) 50 MCG/ACT nasal spray Place 1  spray into both nostrils daily. 16 g 2   Lancets (ONETOUCH DELICA PLUS [id number]) MISC USE TO check blood sugar UP TO 4 TIMES DAILY     levothyroxine (SYNTHROID) 25 MCG tablet TAKE 1 TABLET BY MOUTH EVERY DAY BEFORE BREAKFAST 90 tablet 2   metoCLOPramide (REGLAN) 5 MG tablet 1 tab twice daily before breakfast and supper. May increase to QID (before meals and at bedtime) if needed. 120 tablet 2   ONETOUCH ULTRA test strip      potassium chloride (KLOR-CON) 10 MEQ tablet Take 1 tablet (10 mEq total) by mouth 2 (two) times daily. 180 tablet 3   pravastatin (PRAVACHOL) 40 MG tablet Take 1 tablet (40 mg total) by mouth daily. 90 tablet 3   sertraline (ZOLOFT) 50 MG tablet Take 1 tablet daily 90 tablet 3   XARELTO 20 MG TABS tablet TAKE 1 TABLET BY MOUTH DAILY WITH SUPPER 90 tablet 1   [DISCONTINUED] Prochlorperazine Maleate (COMPAZINE PO) Take by mouth.     No current facility-administered medications on file prior to visit.    Allergies:  Allergies  Allergen Reactions   Lisinopril Swelling   Past Medical History:  Past Medical History:  Diagnosis Date   Acid reflux    Allergy    seasonal   Breast cancer ([place])    right,no iv or blood on right sid, chemo and radiation 2014   Family history of brain cancer    Family history of breast cancer    Heartburn    Hypercholesteremia    taken off chol meds Dec 2012   Hypertension    Hypertension    Personal history of chemotherapy 05/2012   rt breast   Personal history of radiation therapy 10/2012   rt breast   Past Surgical History:  Past Surgical History:  Procedure Laterality Date   BREAST BIOPSY Right 04/16/2012   BREAST SURGERY Right 05/20/12   Rt br mastectomy   CESAREAN SECTION  25 years ago   colonoscopy     COLONOSCOPY WITH PROPOFOL N/A 01/18/2015   Procedure: COLONOSCOPY WITH PROPOFOL;  Surgeon: Mauri Pole, MD;  [facility];  Service: Endoscopy;  Laterality: N/A;   MASTECTOMY Right 05/2012   MODIFIED  MASTECTOMY Right 05/20/2012   Procedure: MODIFIED MASTECTOMY;  Surgeon: Edward Jolly, MD;  Location: [facility];  Service: General;  Laterality: Right;   PORTACATH PLACEMENT Left 05/20/2012   Procedure: INSERTION PORT-A-CATH;  Surgeon: Edward Jolly, MD;  Location: [facility];  Service: General;  Laterality: Left;   SUBOCCIPITAL CRANIECTOMY CERVICAL LAMINECTOMY N/A 11/21/2016   Procedure: SUBOCCIPITAL CRANIECTOMY RESECTION TUMOR;  Surgeon: Newman Pies, MD;  Location: [facility];  Service: Neurosurgery;  Laterality: N/A;   TUBAL LIGATION     Social History:  Social History   Socioeconomic History   Marital status: Divorced    Spouse name: Not on file   Number of children: 2   Years of education: 16   Highest education level: Not on file  Occupational History   Occupation: Pension scheme manager  Tobacco Use   Smoking status: Never   Smokeless tobacco: Former    Types:  Sarina Ser    Quit date: 11/18/1996  Substance and Sexual Activity   Alcohol use: No    Alcohol/week: 0.0 standard drinks   Drug use: No   Sexual activity: Never    Comment: menarche 62, P2, no HRT  Other Topics Concern   Not on file  Social History Narrative   Fun: Theodoro Kos   Denies religious beliefs effecting health care.    Social Determinants of Health   Financial Resource Strain: Not on file  Food Insecurity: Not on file  Transportation Needs: Not on file  Physical Activity: Not on file  Stress: Not on file  Social Connections: Not on file  Intimate Partner Violence: Not on file   Family History:  Family History  Problem Relation Age of Onset   Hypertension Mother    Aneurysm Mother 36   Brain cancer Sister 9   Breast cancer Cousin 30   Jaundice Father 15   Diabetes Daughter    Breast cancer Cousin 58   Breast cancer Cousin 6   Colon cancer Neg Hx     Review of Systems: Constitutional: Denies fevers, chills or abnormal weight loss Eyes: blurry distance vision Ears, nose, mouth, throat, and face:  Denies mucositis or sore throat Respiratory: Denies cough, dyspnea or wheezes Cardiovascular: Denies palpitation, chest discomfort or lower extremity swelling Gastrointestinal:  Denies nausea, constipation, diarrhea GU: Denies dysuria or incontinence Skin: Denies abnormal skin rashes Neurological: Per HPI Musculoskeletal: midline back pain Behavioral/Psych: Denies anxiety, disturbance in thought content, and mood instability   Physical Exam: There were no vitals filed for this visit.  KPS: 70. General: Alert, cooperative, pleasant, in no acute distress.  In wheelchair Head: Craniotomy scar noted, dry and intact. EENT: No conjunctival injection or scleral icterus. Oral mucosa moist Lungs: Resp effort normal Cardiac: Regular rate and rhythm Abdomen: Soft, non-distended abdomen Skin: No rashes cyanosis or petechiae. Extremities: No clubbing or edema  Neurologic Exam: Mental Status: Awake, alert, attentive to examiner. Oriented to self and environment. Language is fluent with intact comprehension.  Cranial Nerves: Visual acuity is grossly normal. Visual fields are full. Extra-ocular movements intact. No ptosis. Face is symmetric, tongue midline. Motor: Tone and bulk are normal. Power is full in both arms and legs. Hyperreflexia noted in bilateral legs (3+) with sustained clonus in right > left ankle.  Arm reflexes normal. Mild dysmetria noted on bilateral finger to nose. Sensory: Intact to light touch and temperature Gait: Dystaxic  Labs: I have reviewed the data as listed    Component Value Date/Time   NA 141 01/03/2021 1155   NA 143 07/05/2014 1101   K 3.7 01/03/2021 1155   K 3.7 07/05/2014 1101   CL 102 01/03/2021 1155   CL 103 09/23/2012 1040   CO2 29 01/03/2021 1155   CO2 27 07/05/2014 1101   GLUCOSE 163 (H) 01/03/2021 1155   GLUCOSE 113 07/05/2014 1101   GLUCOSE 140 (H) 09/23/2012 1040   BUN 11 01/03/2021 1155   BUN 11.8 07/05/2014 1101   CREATININE 0.60 01/03/2021  1155   CREATININE 0.55 12/21/2019 1027   CREATININE 0.8 07/05/2014 1101   CALCIUM 10.3 01/03/2021 1155   CALCIUM 9.4 07/05/2014 1101   PROT 8.0 01/03/2021 1155   PROT 6.6 07/05/2014 1101   ALBUMIN 4.6 01/03/2021 1155   ALBUMIN 3.4 (L) 07/05/2014 1101   AST 18 01/03/2021 1155   AST 40 07/15/2019 0825   AST 18 07/05/2014 1101   ALT 19 01/03/2021 1155   ALT 39 07/15/2019  0825   ALT 15 07/05/2014 1101   ALKPHOS 104 01/03/2021 1155   ALKPHOS 45 07/05/2014 1101   BILITOT 0.4 01/03/2021 1155   BILITOT 0.4 07/15/2019 0825   BILITOT 0.21 07/05/2014 1101   GFRNONAA >60 12/01/2019 1248   GFRNONAA >60 07/15/2019 0825   GFRAA >60 12/01/2019 1248   GFRAA >60 07/15/2019 0825   Lab Results  Component Value Date   WBC 5.5 01/03/2021   NEUTROABS 2.8 01/03/2021   HGB 14.8 01/03/2021   HCT 45.2 01/03/2021   MCV 88.3 01/03/2021   PLT 254.0 01/03/2021    Imaging:  [place] Clinician Interpretation: I have personally reviewed the CNS images as listed.  My interpretation, in the context of the patient's clinical presentation, is stable disease  MR BRAIN W WO CONTRAST  Result Date: 03/18/2021 CLINICAL DATA:  Metastatic breast cancer. Previous surgery and S RS. Follow-up. EXAM: MRI HEAD WITHOUT AND WITH CONTRAST TECHNIQUE: Multiplanar, multiecho pulse sequences of the brain and surrounding structures were obtained without and with intravenous contrast. CONTRAST:  26m MULTIHANCE GADOBENATE DIMEGLUMINE 529 MG/ML IV SOLN COMPARISON:  03/18/2020 FINDINGS: Brain: Diffusion imaging does not show any acute or subacute infarction or other cause of restricted diffusion. Previous occipital craniotomy for resection of a large mass initially visualized in August of 2018 in the region of the vermis. There is atrophy and encephalomalacia in that region with adjacent gliosis consistent with postsurgical and post treatment changes. No evidence of residual or recurrent lesion. There is some post treatment enhancement in  the area as seen previously. No mass-effect or progression. The brain otherwise shows a degree of generalized atrophy with chronic small vessel type ischemic changes of the cerebral hemispheric deep and subcortical white matter. No cortical or large vessel territory insult. No evidence of supra tentorial mass lesion, hemorrhage, hydrocephalus or extra-axial collection. Vascular: Major vessels at the base of the brain show flow. Skull and upper cervical spine: Negative Sinuses/Orbits: Clear/normal Other: None IMPRESSION: No evidence of residual or recurrent disease. Areas of post treatment enhancement within the cerebellum are stable since last year. Chronic atrophy, encephalomalacia and gliosis related to posterior fossa tumor resection and radiation. Moderate chronic small-vessel ischemic changes of the cerebral hemispheric white matter as seen previously. Electronically Signed   By: MNelson ChimesM.D.   On: 03/18/2021 16:24   MM 3D SCREEN BREAST UNI LEFT  Result Date: 03/03/2021 CLINICAL DATA:  Screening. EXAM: DIGITAL SCREENING UNILATERAL LEFT MAMMOGRAM WITH CAD AND TOMOSYNTHESIS TECHNIQUE: Left screening digital craniocaudal and mediolateral oblique mammograms were obtained. Left screening digital breast tomosynthesis was performed. The images were evaluated with computer-aided detection. COMPARISON:  Previous exam(s). ACR Breast Density Category b: There are scattered areas of fibroglandular density. FINDINGS: In the left breast, a possible asymmetry and separate calcifications warrant further evaluation. Status post RIGHT mastectomy. IMPRESSION: Further evaluation is suggested for possible asymmetry and calcifications in the left breast. RECOMMENDATION: Diagnostic mammogram and possibly ultrasound of the left breast. (Code:[id number]) The patient will be contacted regarding the findings, and additional imaging will be scheduled. BI-RADS CATEGORY  0: Incomplete. Need additional imaging evaluation and/or prior  mammograms for comparison. Electronically Signed   By: SValentino SaxonM.D.   On: 03/03/2021 16:18     Assessment/Plan Brain metastasis ([facility]  Ms. MKarleis clinically and radiographically stable today.  No new or progressive deficits.  Will continue neuro-focused physical therapy for her, given its prior impact on her balance and coordination.  We appreciate the opportunity to participate in the  care of Tasha Ortiz.    We ask that Tasha Ortiz return to clinic in 12 months following next brain MRI, or sooner as needed.  All questions were answered. The patient knows to call the clinic with any problems, questions or concerns. No barriers to learning were detected.  I have spent a total of 30 minutes of face-to-face and non-face-to-face time, excluding clinical staff time, preparing to see patient, ordering tests and/or medications, counseling the patient, and independently interpreting results and communicating results to the patient/family/caregiver    Ventura Sellers, MD Medical Director of Neuro-Oncology [facility] at [place] 03/23/21 12:06 PM

## 2021-04-12 ENCOUNTER — Ambulatory Visit
Admission: RE | Admit: 2021-04-12 | Discharge: 2021-04-12 | Disposition: A | Discharge status: Home / Self Care | Payer: Medicare Other | Source: Ambulatory Visit | Attending: Family | Admitting: Family

## 2021-04-12 ENCOUNTER — Other Ambulatory Visit: Payer: Self-pay | Admitting: Family

## 2021-04-12 DIAGNOSIS — R921 Mammographic calcification found on diagnostic imaging of breast: Secondary | ICD-10-CM

## 2021-04-12 DIAGNOSIS — R928 Other abnormal and inconclusive findings on diagnostic imaging of breast: Secondary | ICD-10-CM

## 2021-04-12 DIAGNOSIS — R922 Inconclusive mammogram: Secondary | ICD-10-CM | POA: Diagnosis not present

## 2021-04-12 DIAGNOSIS — N6489 Other specified disorders of breast: Secondary | ICD-10-CM | POA: Diagnosis not present

## 2021-04-14 ENCOUNTER — Other Ambulatory Visit: Payer: Self-pay | Admitting: Hematology and Oncology

## 2021-04-21 ENCOUNTER — Ambulatory Visit
Admission: RE | Admit: 2021-04-21 | Discharge: 2021-04-21 | Disposition: A | Discharge status: Home / Self Care | Payer: Medicare Other | Source: Ambulatory Visit | Attending: Family | Admitting: Family

## 2021-04-21 DIAGNOSIS — R921 Mammographic calcification found on diagnostic imaging of breast: Secondary | ICD-10-CM

## 2021-04-21 DIAGNOSIS — R928 Other abnormal and inconclusive findings on diagnostic imaging of breast: Secondary | ICD-10-CM

## 2021-04-21 DIAGNOSIS — N6489 Other specified disorders of breast: Secondary | ICD-10-CM | POA: Diagnosis not present

## 2021-05-02 DIAGNOSIS — H34811 Central retinal vein occlusion, right eye, with macular edema: Secondary | ICD-10-CM | POA: Diagnosis not present

## 2021-05-02 DIAGNOSIS — H35372 Puckering of macula, left eye: Secondary | ICD-10-CM | POA: Diagnosis not present

## 2021-05-02 DIAGNOSIS — H34832 Tributary (branch) retinal vein occlusion, left eye, with macular edema: Secondary | ICD-10-CM | POA: Diagnosis not present

## 2021-05-02 DIAGNOSIS — H43811 Vitreous degeneration, right eye: Secondary | ICD-10-CM | POA: Diagnosis not present

## 2021-05-18 ENCOUNTER — Other Ambulatory Visit: Payer: Self-pay | Admitting: Family

## 2021-05-26 ENCOUNTER — Other Ambulatory Visit: Payer: Self-pay | Admitting: Hematology and Oncology

## 2021-05-29 ENCOUNTER — Other Ambulatory Visit: Payer: Self-pay

## 2021-05-29 ENCOUNTER — Encounter (HOSPITAL_COMMUNITY): Payer: Self-pay

## 2021-05-29 ENCOUNTER — Ambulatory Visit (HOSPITAL_COMMUNITY)
Admission: RE | Admit: 2021-05-29 | Discharge: 2021-05-29 | Disposition: A | Discharge status: Home / Self Care | Payer: Medicare Other | Source: Ambulatory Visit | Attending: Hematology and Oncology | Admitting: Hematology and Oncology

## 2021-05-29 DIAGNOSIS — C50919 Malignant neoplasm of unspecified site of unspecified female breast: Secondary | ICD-10-CM | POA: Insufficient documentation

## 2021-05-29 DIAGNOSIS — C7931 Secondary malignant neoplasm of brain: Secondary | ICD-10-CM | POA: Insufficient documentation

## 2021-05-29 DIAGNOSIS — I700 Atherosclerosis of aorta: Secondary | ICD-10-CM | POA: Diagnosis not present

## 2021-05-29 DIAGNOSIS — C50411 Malignant neoplasm of upper-outer quadrant of right female breast: Secondary | ICD-10-CM | POA: Diagnosis not present

## 2021-05-29 DIAGNOSIS — J984 Other disorders of lung: Secondary | ICD-10-CM | POA: Diagnosis not present

## 2021-05-29 DIAGNOSIS — C50911 Malignant neoplasm of unspecified site of right female breast: Secondary | ICD-10-CM | POA: Diagnosis not present

## 2021-05-29 LAB — POCT I-STAT CREATININE: Creatinine, Ser: 0.6 mg/dL (ref 0.44–1.00)

## 2021-05-29 MED ORDER — HEPARIN SOD (PORK) LOCK FLUSH 100 UNIT/ML IV SOLN: 500.0000 [IU] | Freq: Once | INTRAVENOUS | Status: DC

## 2021-05-29 MED ORDER — HEPARIN SOD (PORK) LOCK FLUSH 100 UNIT/ML IV SOLN
INTRAVENOUS | Status: AC
  Filled 2021-05-29: qty 5

## 2021-05-29 MED ORDER — SODIUM CHLORIDE (PF) 0.9 % IJ SOLN
INTRAMUSCULAR | Status: AC
  Filled 2021-05-29: qty 50

## 2021-05-29 MED ORDER — IOHEXOL 300 MG/ML  SOLN
100.0000 mL | Freq: Once | INTRAMUSCULAR | Status: AC | PRN
  Administered 2021-05-29: 100 mL via INTRAVENOUS

## 2021-06-02 NOTE — Progress Notes (Incomplete)
Patient Care Team: [name], [place] as PCP - General (Internal Medicine)  DIAGNOSIS: No diagnosis found.  SUMMARY OF ONCOLOGIC HISTORY: Oncology History  Primary cancer of upper outer quadrant of right female breast ([place])  04/16/2012 Initial Biopsy   Right breast mass biopsy invasive ductal carcinoma with abundant mucin in all the 3 masses, right axillary lymph node biopsy positive for IDC, ER 100% PR percent Ki-67 19%, HER-2 negative ratio 0.94   04/21/2012 Breast MRI   Right breast 3 masses 11o'clock position 1.8 cm, now 11:00 position 2 cm, and 10:00 position 2.3 cm with an abnormal level I right axillary lymph node 3 cm   05/20/2012 Surgery   Right breast mastectomy invasive ductal carcinoma grade 2; 1.9 cm, 2.5 cm, 1.8 cm, 1/17 LN positive, second and third massive grade 3   06/24/2012 - 10/07/2012 Chemotherapy   Adjuvant chemotherapy with Taxotere Cytoxan x6 cycles   10/16/2012 - 12/16/2012 Radiation Therapy   Adjuvant radiation therapy by Dr. [name]   01/07/2013 -  Anti-estrogen oral therapy   Tamoxifen 20 mg daily   11/19/2016 Imaging   MRI brain: 2.9 similar mass within the inferior vermis with surrounding edema and local mass effect partially effacing the fourth ventricle.    11/21/2016 Relapse/Recurrence   Brain biopsy: Metastatic ductal carcinoma the breast positive for ER PR, GATA 3, negative for PAX8 and TTF-1   11/21/2016 Surgery   Suboccipital craniotomy for gross total resection of cerebellar tumor by Dr. [name]     CHIEF COMPLIANT: Follow-up of metastatic breast cancer and bilateral PEs  INTERVAL HISTORY: Tasha Ortiz is a 66 y.o. with above-mentioned history of metastatic breast cancer with brain metastases who is followed by Dr. Mickeal Skinner. She also has a history of bilateral PEs for which she is currently on Xarelto. CT CAP on 05/29/2021 showed no acute process or evidence of metastatic disease within the chest, abdomen, or pelvis. She presents  to the clinic today for follow-up.   ALLERGIES:  is allergic to lisinopril.  MEDICATIONS:  Current Outpatient Medications  Medication Sig Dispense Refill   amLODipine (NORVASC) 10 MG tablet TAKE 1 TABLET BY MOUTH EVERY DAY 90 tablet 3   anastrozole (ARIMIDEX) 1 MG tablet TAKE 1 TABLET BY MOUTH EVERY DAY 90 tablet 0   blood glucose meter kit and supplies KIT Dispense based on patient and insurance preference. Use up to four times daily as directed. (FOR ICD-9 250.00, 250.01). 1 each 0   diclofenac Sodium (VOLTAREN) 1 % GEL Apply 2 g topically 4 (four) times daily. 100 g 0   dorzolamide-timolol (COSOPT) 22.3-6.8 MG/ML ophthalmic solution      fluticasone (FLONASE) 50 MCG/ACT nasal spray Place 1 spray into both nostrils daily. 16 g 2   Lancets (ONETOUCH DELICA PLUS [id number]) MISC USE TO check blood sugar UP TO 4 TIMES DAILY     levothyroxine (SYNTHROID) 25 MCG tablet TAKE 1 TABLET BY MOUTH EVERY DAY BEFORE BREAKFAST 90 tablet 2   metoCLOPramide (REGLAN) 5 MG tablet 1 tab twice daily before breakfast and supper. May increase to QID (before meals and at bedtime) if needed. 120 tablet 2   ONETOUCH ULTRA test strip      potassium chloride (KLOR-CON) 10 MEQ tablet Take 1 tablet (10 mEq total) by mouth 2 (two) times daily. 180 tablet 3   pravastatin (PRAVACHOL) 40 MG tablet Take 1 tablet (40 mg total) by mouth daily. 90 tablet 3   sertraline (ZOLOFT) 50 MG tablet Take 1  tablet daily 90 tablet 3   XARELTO 20 MG TABS tablet TAKE 1 TABLET BY MOUTH DAILY WITH SUPPER 90 tablet 1   No current facility-administered medications for this visit.    PHYSICAL EXAMINATION: ECOG PERFORMANCE STATUS: {CHL ONC ECOG [phone]}  There were no vitals filed for this visit. There were no vitals filed for this visit.  BREAST:*** No palpable masses or nodules in either right or left breasts. No palpable axillary supraclavicular or infraclavicular adenopathy no breast tenderness or nipple discharge. (exam  performed in the presence of a chaperone)  LABORATORY DATA:  I have reviewed the data as listed CMP Latest Ref Rng & Units 05/29/2021 01/03/2021 12/21/2019  Glucose 70 - 99 mg/dL - 163(H) 96  BUN 6 - 23 mg/dL - 11 10  Creatinine 0.44 - 1.00 mg/dL 0.60 0.60 0.55  Sodium 135 - 145 mEq/L - 141 142  Potassium 3.5 - 5.1 mEq/L - 3.7 3.9  Chloride 96 - 112 mEq/L - 102 103  CO2 19 - 32 mEq/L - 29 29  Calcium 8.4 - 10.5 mg/dL - 10.3 10.6(H)  Total Protein 6.0 - 8.3 g/dL - 8.0 -  Total Bilirubin 0.2 - 1.2 mg/dL - 0.4 -  Alkaline Phos 39 - 117 U/L - 104 -  AST 0 - 37 U/L - 18 -  ALT 0 - 35 U/L - 19 -    Lab Results  Component Value Date   WBC 5.5 01/03/2021   HGB 14.8 01/03/2021   HCT 45.2 01/03/2021   MCV 88.3 01/03/2021   PLT 254.0 01/03/2021   NEUTROABS 2.8 01/03/2021    ASSESSMENT & PLAN:  No problem-specific Assessment & Plan notes found for this encounter.    No orders of the defined types were placed in this encounter.  The patient has a good understanding of the overall plan. she agrees with it. she will call with any problems that may develop before the next visit here.  Total time spent: *** mins including face to face time and time spent for planning, charting and coordination of care  Rulon Eisenmenger, MD, MPH 06/02/2021  I, Thana Ates, am acting as scribe for Dr. Nicholas Lose.  {insert scribe attestation}

## 2021-06-04 NOTE — Assessment & Plan Note (Signed)
Metastatic breast cancer with brain metastases: Cerebellar lesion 2.9 cm in size status post gross total resection and Dr. [name] on 11/21/2016 ?? ?(Right breast invasive ductal carcinoma multifocal disease ER/PR positive HER-2 negative T2, N1, M0 stage IIB status post mastectomy, adjuvant chemotherapy and radiation now on antiestrogen therapy with tamoxifen since October 2014.) ?? ?Current treatment:?Anastrozole ?Anastrozole toxicities: Denies any hot flashes or myalgias ??? ?CT CAP 05/30/21: No evidence of metastatic disease. ?Hepatic steatosis. ?There is no evidence of metastatic disease in the chest abdomen pelvis. ?Brain MRI 03/18/2020: Stable post resection of posterior fossa tumor. ?? ?Balance issues and lack of strength in lower extremities:? Using a walker ?? ?We will continue to monitor her with scans every?12?months and follow up after. ?

## 2021-06-05 ENCOUNTER — Inpatient Hospital Stay: Payer: Medicare Other | Attending: Internal Medicine | Admitting: Hematology and Oncology

## 2021-06-05 ENCOUNTER — Other Ambulatory Visit: Payer: Self-pay

## 2021-06-05 ENCOUNTER — Other Ambulatory Visit: Payer: Self-pay | Admitting: *Deleted

## 2021-06-05 VITALS — BP 156/80 | HR 77 | Temp 97.0°F | Resp 18 | Ht 64.0 in | Wt 154.4 lb

## 2021-06-05 DIAGNOSIS — C7931 Secondary malignant neoplasm of brain: Secondary | ICD-10-CM | POA: Diagnosis not present

## 2021-06-05 DIAGNOSIS — Z170 Estrogen receptor positive status [ER+]: Secondary | ICD-10-CM | POA: Insufficient documentation

## 2021-06-05 DIAGNOSIS — C50411 Malignant neoplasm of upper-outer quadrant of right female breast: Secondary | ICD-10-CM | POA: Insufficient documentation

## 2021-06-05 DIAGNOSIS — C50919 Malignant neoplasm of unspecified site of unspecified female breast: Secondary | ICD-10-CM | POA: Diagnosis not present

## 2021-06-05 NOTE — Progress Notes (Signed)
? ?Patient Care Team: ?[name], FNP as PCP - General (Internal Medicine) ? ?DIAGNOSIS:  ?Encounter Diagnoses  ?Name Primary?  ? Primary cancer of upper outer quadrant of right female breast ([place])   ? Metastatic breast cancer ([name]) Yes  ? ? ?SUMMARY OF ONCOLOGIC HISTORY: ?Oncology History  ?Primary cancer of upper outer quadrant of right female breast ([name])  ?04/16/2012 Initial Biopsy  ? Right breast mass biopsy invasive ductal carcinoma with abundant mucin in all the 3 masses, right axillary lymph node biopsy positive for IDC, ER 100% PR percent Ki-67 19%, HER-2 negative ratio 0.94 ?  ?04/21/2012 Breast MRI  ? Right breast 3 masses 11o'clock position 1.8 cm, now 11:00 position 2 cm, and 10:00 position 2.3 cm with an abnormal level I right axillary lymph node 3 cm ?  ?05/20/2012 Surgery  ? Right breast mastectomy invasive ductal carcinoma grade 2; 1.9 cm, 2.5 cm, 1.8 cm, 1/17 LN positive, second and third massive grade 3 ?  ?06/24/2012 - 10/07/2012 Chemotherapy  ? Adjuvant chemotherapy with Taxotere Cytoxan x6 cycles ?  ?10/16/2012 - 12/16/2012 Radiation Therapy  ? Adjuvant radiation therapy by Dr. [name] ?  ?01/07/2013 -  Anti-estrogen oral therapy  ? Tamoxifen 20 mg daily ?  ?11/19/2016 Imaging  ? MRI brain: 2.9 similar mass within the inferior vermis with surrounding edema and local mass effect partially effacing the fourth ventricle. ? ?  ?11/21/2016 Relapse/Recurrence  ? Brain biopsy: Metastatic ductal carcinoma the breast positive for ER PR, GATA 3, negative for PAX8 and TTF-1 ?  ?11/21/2016 Surgery  ? Suboccipital craniotomy for gross total resection of cerebellar tumor by Dr. [name] ?  ? ? ?CHIEF COMPLIANT: Follow-up of metastatic breast cancer ? ?INTERVAL HISTORY: Tasha Ortiz is a 66 year old with above-mentioned history of metastatic breast cancer who is here for her annual follow-up.  She has not had any systemic symptoms or complaints.  She continues to have lower extremity weakness and  balance issues.  Brain MRIs performed in December were also found to be good.  She had recent CT scans which did not show any evidence of systemic metastatic disease.  She is tolerating anastrozole extremely well without any problems.  She had a prior history of blood clots and is currently on Xarelto and also tolerating it well. ? ? ?ALLERGIES:  is allergic to lisinopril. ? ?MEDICATIONS:  ?Current Outpatient Medications  ?Medication Sig Dispense Refill  ? amLODipine (NORVASC) 10 MG tablet TAKE 1 TABLET BY MOUTH EVERY DAY 90 tablet 3  ? anastrozole (ARIMIDEX) 1 MG tablet TAKE 1 TABLET BY MOUTH EVERY DAY 90 tablet 0  ? blood glucose meter kit and supplies KIT Dispense based on patient and insurance preference. Use up to four times daily as directed. (FOR ICD-9 250.00, 250.01). 1 each 0  ? diclofenac Sodium (VOLTAREN) 1 % GEL Apply 2 g topically 4 (four) times daily. 100 g 0  ? dorzolamide-timolol (COSOPT) 22.3-6.8 MG/ML ophthalmic solution     ? fluticasone (FLONASE) 50 MCG/ACT nasal spray Place 1 spray into both nostrils daily. 16 g 2  ? Lancets (ONETOUCH DELICA PLUS [id number]) MISC USE TO check blood sugar UP TO 4 TIMES DAILY    ? levothyroxine (SYNTHROID) 25 MCG tablet TAKE 1 TABLET BY MOUTH EVERY DAY BEFORE BREAKFAST 90 tablet 2  ? metoCLOPramide (REGLAN) 5 MG tablet 1 tab twice daily before breakfast and supper. May increase to QID (before meals and at bedtime) if needed. 120 tablet 2  ? ONETOUCH ULTRA  test strip     ? potassium chloride (KLOR-CON) 10 MEQ tablet Take 1 tablet (10 mEq total) by mouth 2 (two) times daily. 180 tablet 3  ? pravastatin (PRAVACHOL) 40 MG tablet Take 1 tablet (40 mg total) by mouth daily. 90 tablet 3  ? sertraline (ZOLOFT) 50 MG tablet Take 1 tablet daily 90 tablet 3  ? XARELTO 20 MG TABS tablet TAKE 1 TABLET BY MOUTH DAILY WITH SUPPER 90 tablet 1  ? ?No current facility-administered medications for this visit.  ? ? ?PHYSICAL EXAMINATION: ?ECOG PERFORMANCE STATUS: 1 - Symptomatic but  completely ambulatory ? ?Vitals:  ? 06/05/21 0817  ?BP: (!) 156/80  ?Pulse: 77  ?Resp: 18  ?Temp: (!) 97 ?F (36.1 ?C)  ?SpO2: 100%  ? ?Filed Weights  ? 06/05/21 0817  ?Weight: 154 lb 6.4 oz (70 kg)  ? ?  ? ?LABORATORY DATA:  ?I have reviewed the data as listed ?CMP Latest Ref Rng & Units 05/29/2021 01/03/2021 12/21/2019  ?Glucose 70 - 99 mg/dL - 163(H) 96  ?BUN 6 - 23 mg/dL - 11 10  ?Creatinine 0.44 - 1.00 mg/dL 0.60 0.60 0.55  ?Sodium 135 - 145 mEq/L - 141 142  ?Potassium 3.5 - 5.1 mEq/L - 3.7 3.9  ?Chloride 96 - 112 mEq/L - 102 103  ?CO2 19 - 32 mEq/L - 29 29  ?Calcium 8.4 - 10.5 mg/dL - 10.3 10.6(H)  ?Total Protein 6.0 - 8.3 g/dL - 8.0 -  ?Total Bilirubin 0.2 - 1.2 mg/dL - 0.4 -  ?Alkaline Phos 39 - 117 U/L - 104 -  ?AST 0 - 37 U/L - 18 -  ?ALT 0 - 35 U/L - 19 -  ? ? ?Lab Results  ?Component Value Date  ? WBC 5.5 01/03/2021  ? HGB 14.8 01/03/2021  ? HCT 45.2 01/03/2021  ? MCV 88.3 01/03/2021  ? PLT 254.0 01/03/2021  ? NEUTROABS 2.8 01/03/2021  ? ? ?ASSESSMENT & PLAN:  ?Primary cancer of upper outer quadrant of right female breast ([place]) ?Metastatic breast cancer with brain metastases: Cerebellar lesion 2.9 cm in size status post gross total resection and Dr. [name] on 11/21/2016 ?  ?(Right breast invasive ductal carcinoma multifocal disease ER/PR positive HER-2 negative T2, N1, M0 stage IIB status post mastectomy, adjuvant chemotherapy and radiation now on antiestrogen therapy with tamoxifen since October 2014.) ?  ?Current treatment: Anastrozole ?Anastrozole toxicities: Denies any hot flashes or myalgias ?   ?CT CAP 05/30/21: No evidence of metastatic disease.  Hepatic steatosis. ?There is no evidence of metastatic disease in the chest abdomen pelvis. ?Brain MRI 03/18/2020: Stable post resection of posterior fossa tumor. ?  ?Balance issues and lack of strength in lower extremities:  Using a walker: We will send her to physical therapy. ?  ?We will continue to monitor her with scans every 12 months and follow up  after. ? ? ?Orders Placed This Encounter  ?Procedures  ? CT CHEST ABDOMEN PELVIS W CONTRAST  ?  Standing Status:   Future  ?  Standing Expiration Date:   06/06/2022  ?  Order Specific Question:   Preferred imaging location?  ?  Answer:   [facility]  ?  Order Specific Question:   Release to patient  ?  Answer:   Immediate  ?  Order Specific Question:   Is Oral Contrast requested for this exam?  ?  Answer:   Yes, Per Radiology protocol  ? Ambulatory referral to Physical Therapy  ?  Referral Priority:  Routine  ?  Referral Type:   Physical Medicine  ?  Referral Reason:   Specialty Services Required  ?  Requested Specialty:   Physical Therapy  ?  Number of Visits Requested:   1  ? ?The patient has a good understanding of the overall plan. she agrees with it. she will call with any problems that may develop before the next visit here. ?Total time spent: 30 mins including face to face time and time spent for planning, charting and co-ordination of care ? ? Harriette Ohara, MD ?06/05/21 ? ? ? ?I, Deritra, and acting as scribe for Dr. Nicholas Lose MD ? ?

## 2021-06-06 DIAGNOSIS — H34811 Central retinal vein occlusion, right eye, with macular edema: Secondary | ICD-10-CM | POA: Diagnosis not present

## 2021-06-06 DIAGNOSIS — H35372 Puckering of macula, left eye: Secondary | ICD-10-CM | POA: Diagnosis not present

## 2021-06-06 DIAGNOSIS — H34832 Tributary (branch) retinal vein occlusion, left eye, with macular edema: Secondary | ICD-10-CM | POA: Diagnosis not present

## 2021-06-06 DIAGNOSIS — H43811 Vitreous degeneration, right eye: Secondary | ICD-10-CM | POA: Diagnosis not present

## 2021-07-05 DIAGNOSIS — Z794 Long term (current) use of insulin: Secondary | ICD-10-CM | POA: Diagnosis not present

## 2021-07-05 DIAGNOSIS — E1136 Type 2 diabetes mellitus with diabetic cataract: Secondary | ICD-10-CM | POA: Diagnosis not present

## 2021-07-05 DIAGNOSIS — Z7984 Long term (current) use of oral hypoglycemic drugs: Secondary | ICD-10-CM | POA: Diagnosis not present

## 2021-07-05 DIAGNOSIS — E1165 Type 2 diabetes mellitus with hyperglycemia: Secondary | ICD-10-CM | POA: Diagnosis not present

## 2021-07-05 DIAGNOSIS — E11319 Type 2 diabetes mellitus with unspecified diabetic retinopathy without macular edema: Secondary | ICD-10-CM | POA: Diagnosis not present

## 2021-07-11 ENCOUNTER — Ambulatory Visit (INDEPENDENT_AMBULATORY_CARE_PROVIDER_SITE_OTHER): Payer: Medicare Other

## 2021-07-11 DIAGNOSIS — Z0000 Encounter for general adult medical examination without abnormal findings: Secondary | ICD-10-CM | POA: Diagnosis not present

## 2021-07-11 NOTE — Progress Notes (Addendum)
Virtual Visit via Telephone Note ? ?I connected with  Tasha Ortiz on 07/11/21 at  2:15 PM EDT by telephone and verified that I am speaking with the correct person using two identifiers. ? ?Medicare Annual Wellness visit completed telephonically due to Covid-19 pandemic.  ? ?Persons participating in this call: This Health Coach and this patient.  ? ?Location: ?Patient: home ?Provider: office ?  ?I discussed the limitations, risks, security and privacy concerns of performing an evaluation and management service by telephone and the availability of in person appointments. The patient expressed understanding and agreed to proceed. ? ?Unable to perform video visit due to video visit attempted and failed and/or patient does not have video capability.  ? ?Some vital signs may be absent or patient reported.  ? ?Willette Brace, LPN ? ? ?Subjective:  ? Tasha Ortiz is a 66 y.o. female who presents for an Initial Medicare Annual Wellness Visit. ? ?Review of Systems    ? ?Cardiac Risk Factors include: advanced age (>92mn, >>79women);diabetes mellitus;hypertension;dyslipidemia ? ?   ?Objective:  ?  ?There were no vitals filed for this visit. ?There is no height or weight on file to calculate BMI. ? ? ?  07/11/2021  ?  2:14 PM 12/01/2019  ? 12:21 PM 07/15/2019  ?  9:38 AM 05/13/2017  ?  9:20 AM 04/17/2017  ? 12:35 PM 02/18/2017  ?  2:32 PM 12/11/2016  ?  8:43 AM  ?Advanced Directives  ?Does Patient Have a Medical Advance Directive? Yes _0  No  ?Type of AScientist, forensicPower of Attorney        ?Copy of [place] Chart? No - copy requested        ?Would patient like information on creating a medical advance directive?  Yes (ED - Information included in AVS) No - Patient declined      ? ? ?Current Medications (verified) ?Outpatient Encounter Medications as of 07/11/2021  ?Medication Sig  ? amLODipine (NORVASC) 10 MG tablet TAKE 1 TABLET BY MOUTH EVERY DAY  ? anastrozole  (ARIMIDEX) 1 MG tablet TAKE 1 TABLET BY MOUTH EVERY DAY  ? blood glucose meter kit and supplies KIT Dispense based on patient and insurance preference. Use up to four times daily as directed. (FOR ICD-9 250.00, 250.01).  ? diclofenac Sodium (VOLTAREN) 1 % GEL Apply 2 g topically 4 (four) times daily.  ? dorzolamide-timolol (COSOPT) 22.3-6.8 MG/ML ophthalmic solution   ? fluticasone (FLONASE) 50 MCG/ACT nasal spray Place 1 spray into both nostrils daily.  ? Lancets (ONETOUCH DELICA PLUS [id number] MISC USE TO check blood sugar UP TO 4 TIMES DAILY  ? levothyroxine (SYNTHROID) 25 MCG tablet TAKE 1 TABLET BY MOUTH EVERY DAY BEFORE BREAKFAST  ? metoCLOPramide (REGLAN) 5 MG tablet 1 tab twice daily before breakfast and supper. May increase to QID (before meals and at bedtime) if needed.  ? ONETOUCH ULTRA test strip   ? potassium chloride (KLOR-CON) 10 MEQ tablet Take 1 tablet (10 mEq total) by mouth 2 (two) times daily.  ? pravastatin (PRAVACHOL) 40 MG tablet Take 1 tablet (40 mg total) by mouth daily.  ? sertraline (ZOLOFT) 50 MG tablet Take 1 tablet daily  ? XARELTO 20 MG TABS tablet TAKE 1 TABLET BY MOUTH DAILY WITH SUPPER  ? [DISCONTINUED] Prochlorperazine Maleate (COMPAZINE PO) Take by mouth.  ? ?No facility-administered encounter medications on file as of 07/11/2021.  ? ? ?Allergies (verified) ?Lisinopril  ? ?History: ?Past  Medical History:  ?Diagnosis Date  ? Acid reflux   ? Allergy   ? seasonal  ? Breast cancer ([place])   ? right,no iv or blood on right sid, chemo and radiation 2014  ? Family history of brain cancer   ? Family history of breast cancer   ? Heartburn   ? Hypercholesteremia   ? taken off chol meds Dec 2012  ? Hypertension   ? Hypertension   ? Personal history of chemotherapy 05/2012  ? rt breast  ? Personal history of radiation therapy 10/2012  ? rt breast  ? ?Past Surgical History:  ?Procedure Laterality Date  ? BREAST BIOPSY Right 04/16/2012  ? BREAST SURGERY Right 05/20/12  ? Rt br mastectomy  ?  CESAREAN SECTION  25 years ago  ? colonoscopy    ? COLONOSCOPY WITH PROPOFOL N/A 01/18/2015  ? Procedure: COLONOSCOPY WITH PROPOFOL;  Surgeon: Mauri Pole, MD;  Location: [facility];  Service: Endoscopy;  Laterality: N/A;  ? MASTECTOMY Right 05/2012  ? MODIFIED MASTECTOMY Right 05/20/2012  ? Procedure: MODIFIED MASTECTOMY;  Surgeon: Edward Jolly, MD;  Location: [place];  Service: General;  Laterality: Right;  ? PORTACATH PLACEMENT Left 05/20/2012  ? Procedure: INSERTION PORT-A-CATH;  Surgeon: Edward Jolly, MD;  Location: [place];  Service: General;  Laterality: Left;  ? SUBOCCIPITAL CRANIECTOMY CERVICAL LAMINECTOMY N/A 11/21/2016  ? Procedure: SUBOCCIPITAL CRANIECTOMY RESECTION TUMOR;  Surgeon: Newman Pies, MD;  Location: [place];  Service: Neurosurgery;  Laterality: N/A;  ? TUBAL LIGATION    ? ?Family History  ?Problem Relation Age of Onset  ? Hypertension Mother   ? Aneurysm Mother 41  ? Brain cancer Sister 74  ? Breast cancer Cousin 8  ? Jaundice Father 51  ? Diabetes Daughter   ? Breast cancer Cousin 81  ? Breast cancer Cousin 22  ? Colon cancer Neg Hx   ? ?Social History  ? ?Socioeconomic History  ? Marital status: Divorced  ?  Spouse name: Not on file  ? Number of children: 2  ? Years of education: 30  ? Highest education level: Not on file  ?Occupational History  ? Occupation: Pension scheme manager  ?Tobacco Use  ? Smoking status: Never  ? Smokeless tobacco: Former  ?  Types: Chew  ?  Quit date: 11/18/1996  ?Substance and Sexual Activity  ? Alcohol use: No  ?  Alcohol/week: 0.0 standard drinks  ? Drug use: No  ? Sexual activity: Never  ?  Comment: menarche 31, P2, no HRT  ?Other Topics Concern  ? Not on file  ?Social History Narrative  ? Fun: Church  ? Denies religious beliefs effecting health care.   ? ?Social Determinants of Health  ? ?Financial Resource Strain: Low Risk   ? Difficulty of Paying Living Expenses: Not hard at all  ?Food Insecurity: No Food Insecurity  ? Worried About Ship broker in the Last Year: Never true  ? Ran Out of Food in the Last Year: Never true  ?Transportation Needs: No Transportation Needs  ? Lack of Transportation (Medical): No  ? Lack of Transportation (Non-Medical): No  ?Physical Activity: Inactive  ? Days of Exercise per Week: 0 days  ? Minutes of Exercise per Session: 0 min  ?Stress: No Stress Concern Present  ? Feeling of Stress : Not at all  ?Social Connections: Moderately Isolated  ? Frequency of Communication with Friends and Family: More than three times a week  ? Frequency of Social Gatherings  with Friends and Family: More than three times a week  ? Attends Religious Services: More than 4 times per year  ? Active Member of Clubs or Organizations: No  ? Attends Archivist Meetings: Never  ? Marital Status: Divorced  ? ? ?Tobacco Counseling ?Counseling given: Not Answered ? ? ?Clinical Intake: ? ?Pre-visit preparation completed: Yes ? ?Pain : No/denies pain ? ?  ? ?Diabetes: Yes ?CBG done?: Yes (94) ?CBG resulted in Enter/ Edit results?: No ?Did pt. bring in CBG monitor from home?: No ? ?How often do you need to have someone help you when you read instructions, pamphlets, or other written materials from your doctor or pharmacy?: 1 - Never ? ?Diabetic?Nutrition Risk Assessment: ? ?Has the patient had any N/V/D within the last 2 months?  No  ?Does the patient have any non-healing wounds?  No  ?Has the patient had any unintentional weight loss or weight gain?  No  ? ?Diabetes: ? ?Is the patient diabetic?  Yes  ?If diabetic, was a CBG obtained today?  Yes  ?Did the patient bring in their glucometer from home?  No  ?How often do you monitor your CBG's? Daily.  ? ?Financial Strains and Diabetes Management: ? ?Are you having any financial strains with the device, your supplies or your medication? No .  ?Does the patient want to be seen by Chronic Care Management for management of their diabetes?  No  ?Would the patient like to be referred to a Nutritionist or  for Diabetic Management?  No  ? ?Diabetic Exams: ? ?Diabetic Eye Exam: Overdue for diabetic eye exam. Pt has been advised about the importance in completing this exam. Patient advised to call and schedule an eye e

## 2021-07-11 NOTE — Patient Instructions (Signed)
Ms. Kassin , ?Thank you for taking time to come for your Medicare Wellness Visit. I appreciate your ongoing commitment to your health goals. Please review the following plan we discussed and let me know if I can assist you in the future.  ? ?Screening recommendations/referrals: ?Colonoscopy: Done 01/18/15 repeat every 10 years  ?Mammogram: Done 03/03/21 repeat every year ?Recommended yearly ophthalmology/optometry visit for glaucoma screening and checkup ?Recommended yearly dental visit for hygiene and checkup ? ?Vaccinations: ?Influenza vaccine: Done 01/03/21 repeat every year  ?Pneumococcal vaccine: Up to date ?Tdap vaccine: due ?Shingles vaccine: Shingrix discussed. Please contact your pharmacy for coverage information.    ?Covid-19:pt will call back with dates  ? ?Advanced directives: copies in chart ? ?Conditions/risks identified: Get back to walking without a walker  ? ?Next appointment: Follow up in one year for your annual wellness visit  ? ? ?Preventive Care 2 Years and Older, Female ?Preventive care refers to lifestyle choices and visits with your health care provider that can promote health and wellness. ?What does preventive care include? ?A yearly physical exam. This is also called an annual well check. ?Dental exams once or twice a year. ?Routine eye exams. Ask your health care provider how often you should have your eyes checked. ?Personal lifestyle choices, including: ?Daily care of your teeth and gums. ?Regular physical activity. ?Eating a healthy diet. ?Avoiding tobacco and drug use. ?Limiting alcohol use. ?Practicing safe sex. ?Taking low-dose aspirin every day. ?Taking vitamin and mineral supplements as recommended by your health care provider. ?What happens during an annual well check? ?The services and screenings done by your health care provider during your annual well check will depend on your age, overall health, lifestyle risk factors, and family history of disease. ?Counseling  ?Your  health care provider may ask you questions about your: ?Alcohol use. ?Tobacco use. ?Drug use. ?Emotional well-being. ?Home and relationship well-being. ?Sexual activity. ?Eating habits. ?History of falls. ?Memory and ability to understand (cognition). ?Work and work Statistician. ?Reproductive health. ?Screening  ?You may have the following tests or measurements: ?Height, weight, and BMI. ?Blood pressure. ?Lipid and cholesterol levels. These may be checked every 5 years, or more frequently if you are over 24 years old. ?Skin check. ?Lung cancer screening. You may have this screening every year starting at age 22 if you have a 30-pack-year history of smoking and currently smoke or have quit within the past 15 years. ?Fecal occult blood test (FOBT) of the stool. You may have this test every year starting at age 45. ?Flexible sigmoidoscopy or colonoscopy. You may have a sigmoidoscopy every 5 years or a colonoscopy every 10 years starting at age 37. ?Hepatitis C blood test. ?Hepatitis B blood test. ?Sexually transmitted disease (STD) testing. ?Diabetes screening. This is done by checking your blood sugar (glucose) after you have not eaten for a while (fasting). You may have this done every 1-3 years. ?Bone density scan. This is done to screen for osteoporosis. You may have this done starting at age 37. ?Mammogram. This may be done every 1-2 years. Talk to your health care provider about how often you should have regular mammograms. ?Talk with your health care provider about your test results, treatment options, and if necessary, the need for more tests. ?Vaccines  ?Your health care provider may recommend certain vaccines, such as: ?Influenza vaccine. This is recommended every year. ?Tetanus, diphtheria, and acellular pertussis (Tdap, Td) vaccine. You may need a Td booster every 10 years. ?Zoster vaccine. You may need this  after age 74. ?Pneumococcal 13-valent conjugate (PCV13) vaccine. One dose is recommended after age  42. ?Pneumococcal polysaccharide (PPSV23) vaccine. One dose is recommended after age 20. ?Talk to your health care provider about which screenings and vaccines you need and how often you need them. ?This information is not intended to replace advice given to you by your health care provider. Make sure you discuss any questions you have with your health care provider. ?Document Released: 04/15/2015 Document Revised: 12/07/2015 Document Reviewed: 01/18/2015 ?Elsevier Interactive Patient Education ? 2017 [place]. ? ?Fall Prevention in the Home ?Falls can cause injuries. They can happen to people of all ages. There are many things you can do to make your home safe and to help prevent falls. ?What can I do on the outside of my home? ?Regularly fix the edges of walkways and driveways and fix any cracks. ?Remove anything that might make you trip as you walk through a door, such as a raised step or threshold. ?Trim any bushes or trees on the path to your home. ?Use bright outdoor lighting. ?Clear any walking paths of anything that might make someone trip, such as rocks or tools. ?Regularly check to see if handrails are loose or broken. Make sure that both sides of any steps have handrails. ?Any raised decks and porches should have guardrails on the edges. ?Have any leaves, snow, or ice cleared regularly. ?Use sand or salt on walking paths during winter. ?Clean up any spills in your garage right away. This includes oil or grease spills. ?What can I do in the bathroom? ?Use night lights. ?Install grab bars by the toilet and in the tub and shower. Do not use towel bars as grab bars. ?Use non-skid mats or decals in the tub or shower. ?If you need to sit down in the shower, use a plastic, non-slip stool. ?Keep the floor dry. Clean up any water that spills on the floor as soon as it happens. ?Remove soap buildup in the tub or shower regularly. ?Attach bath mats securely with double-sided non-slip rug tape. ?Do not have throw  rugs and other things on the floor that can make you trip. ?What can I do in the bedroom? ?Use night lights. ?Make sure that you have a light by your bed that is easy to reach. ?Do not use any sheets or blankets that are too big for your bed. They should not hang down onto the floor. ?Have a firm chair that has side arms. You can use this for support while you get dressed. ?Do not have throw rugs and other things on the floor that can make you trip. ?What can I do in the kitchen? ?Clean up any spills right away. ?Avoid walking on wet floors. ?Keep items that you use a lot in easy-to-reach places. ?If you need to reach something above you, use a strong step stool that has a grab bar. ?Keep electrical cords out of the way. ?Do not use floor polish or wax that makes floors slippery. If you must use wax, use non-skid floor wax. ?Do not have throw rugs and other things on the floor that can make you trip. ?What can I do with my stairs? ?Do not leave any items on the stairs. ?Make sure that there are handrails on both sides of the stairs and use them. Fix handrails that are broken or loose. Make sure that handrails are as long as the stairways. ?Check any carpeting to make sure that it is firmly attached to the  stairs. Fix any carpet that is loose or worn. ?Avoid having throw rugs at the top or bottom of the stairs. If you do have throw rugs, attach them to the floor with carpet tape. ?Make sure that you have a light switch at the top of the stairs and the bottom of the stairs. If you do not have them, ask someone to add them for you. ?What else can I do to help prevent falls? ?Wear shoes that: ?Do not have high heels. ?Have rubber bottoms. ?Are comfortable and fit you well. ?Are closed at the toe. Do not wear sandals. ?If you use a stepladder: ?Make sure that it is fully opened. Do not climb a closed stepladder. ?Make sure that both sides of the stepladder are locked into place. ?Ask someone to hold it for you, if  possible. ?Clearly mark and make sure that you can see: ?Any grab bars or handrails. ?First and last steps. ?Where the edge of each step is. ?Use tools that help you move around (mobility aids) if they are need

## 2021-07-13 ENCOUNTER — Encounter: Payer: Self-pay | Admitting: Physical Therapy

## 2021-07-13 ENCOUNTER — Ambulatory Visit: Payer: Medicare Other | Attending: Hematology and Oncology | Admitting: Physical Therapy

## 2021-07-13 DIAGNOSIS — M6281 Muscle weakness (generalized): Secondary | ICD-10-CM | POA: Diagnosis not present

## 2021-07-13 DIAGNOSIS — R262 Difficulty in walking, not elsewhere classified: Secondary | ICD-10-CM | POA: Diagnosis not present

## 2021-07-13 DIAGNOSIS — R293 Abnormal posture: Secondary | ICD-10-CM | POA: Insufficient documentation

## 2021-07-13 DIAGNOSIS — C50411 Malignant neoplasm of upper-outer quadrant of right female breast: Secondary | ICD-10-CM | POA: Diagnosis not present
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
## 2021-07-14 DIAGNOSIS — H34832 Tributary (branch) retinal vein occlusion, left eye, with macular edema: Secondary | ICD-10-CM | POA: Diagnosis not present

## 2021-07-14 DIAGNOSIS — H35372 Puckering of macula, left eye: Secondary | ICD-10-CM | POA: Diagnosis not present

## 2021-07-14 DIAGNOSIS — H3582 Retinal ischemia: Secondary | ICD-10-CM | POA: Diagnosis not present

## 2021-07-14 DIAGNOSIS — H34811 Central retinal vein occlusion, right eye, with macular edema: Secondary | ICD-10-CM | POA: Diagnosis not present

## 2021-07-14 DIAGNOSIS — H3561 Retinal hemorrhage, right eye: Secondary | ICD-10-CM | POA: Diagnosis not present

## 2021-07-18 ENCOUNTER — Other Ambulatory Visit: Payer: Self-pay | Admitting: Hematology and Oncology

## 2021-07-19 ENCOUNTER — Ambulatory Visit: Payer: Medicare Other

## 2021-07-19 DIAGNOSIS — R293 Abnormal posture: Secondary | ICD-10-CM

## 2021-07-19 DIAGNOSIS — C50411 Malignant neoplasm of upper-outer quadrant of right female breast: Secondary | ICD-10-CM

## 2021-07-19 DIAGNOSIS — M6281 Muscle weakness (generalized): Secondary | ICD-10-CM

## 2021-07-19 DIAGNOSIS — R262 Difficulty in walking, not elsewhere classified: Secondary | ICD-10-CM | POA: Diagnosis not present

## 2021-07-19 NOTE — Patient Instructions (Addendum)
KNEE: Extension, Long Arc Quads - Sitting ? ? ? ?Raise leg until knee is straight. ?_10__ reps per set, _5-10__ second holds, _2-3_ times a day. ? ?Seated Alternating Leg Raise (Marching) ? ? ? ?Sit on a chair. Raise bent knee and return. Repeat with other leg. ?Do __1-2_ sets of __10_ repetitions slowly and limit side to side weight shift.  ? ?Biceps Curl ? ? ? ?Extend one arm, holding _1-2__ lb weight, at side of chair, palm in. Lift hand toward shoulder. Hold __3_ seconds. ?Repeat __10-15_ times each arm, alternating. Do __2-3_ sessions per day. Do these slow and controlled. ? ?Overhead Press ? ? ? ?Hold a weight (can of veggies) in each hand. Palms facing in and fists against front of shoulders, ALTERNATE straightening arms over head.  ?Repeat __10_ times. Do __2-3_ sessions per day. ?Do with _1-2__ lb weight. ?      ?HIP: Flexion Standing ? ? ? ?Holding onto counter lift leg straight in front keeping knee straight. Slow and controlled! ?_10__ reps per set, _2-3__ sets per day. Then repeat with other leg.  ? ?Hip Abduction (Standing) ? ? ? ?Stand with support. Squeeze pelvic floor and hold. Lift right leg out to side, keeping toe forward.  ?Repeat _10__ times. Do _2-3__ times a day ? ?Repeat with other leg.  ?Hip Extension (Standing) ? ? ? ?Stand with support at counter. Squeeze pelvic floor and hold so as not to twist hips and don't lean forward.  Move right leg backward with straight knee. Slow and controlled! ?Repeat _10__ times. Do _2-3__ times a day. ?Repeat with other leg. ? ?Mini Squat: Double Leg ? ? ? ?Stand at dining room table with chair behind you and facing table. With feet shoulder width apart, reach forward for balance and do a mini squat. Keep knees in line with second toe. Knees do not go past toes. When returning to stand, stand all the way up squeezing butt cheeks and stomach.  ?Repeat _10__ times per set. Perform slow and controlled.  ? ?Heel Raise: Bilateral (Standing)    ? ? ? ?Stand near  counter for fingertip support if needed. Rise on balls of feet. ?Repeat __10-20__ times per set. Do _1-2___ sets per session. Do __2__ sessions per day. ? ?SINGLE LIMB STANCE ? ? ? ?Stand at counter (corner if you have one) for minimal arm support. Raise leg. Hold _10-20__ seconds. Repeat with other leg. Once this becomes easier, for increased challenge, close eyes with fingertips on counter for safety. ?_3-5__ reps per set, _2-3__ sets per day. ? ?Tandem Stance ? ? ? ?Stand at counter (corner if you have one). Right foot in front of left, heel touching toe both feet "straight ahead". Stand on Foot Triangle of Support with both feet. Balance in this position _10-20__ seconds. ?Do with left foot in front of right. Can also walk in heel-toe pattern in your hallway. ? ? ?Cancer Rehab [phone] ?
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
## 2021-07-21 ENCOUNTER — Ambulatory Visit: Payer: Medicare Other

## 2021-07-21 DIAGNOSIS — M6281 Muscle weakness (generalized): Secondary | ICD-10-CM

## 2021-07-21 DIAGNOSIS — C50411 Malignant neoplasm of upper-outer quadrant of right female breast: Secondary | ICD-10-CM

## 2021-07-21 DIAGNOSIS — R293 Abnormal posture: Secondary | ICD-10-CM | POA: Diagnosis not present

## 2021-07-21 DIAGNOSIS — R262 Difficulty in walking, not elsewhere classified: Secondary | ICD-10-CM | POA: Diagnosis not present

## 2021-07-21 NOTE — Patient Instructions (Signed)
Piriformis Stretch, Sitting ? ? ? ?Sit, one ankle on opposite knee, same-side hand on crossed knee. Push down on knee, keeping spine straight. Lean torso forward, with flat back, until tension is felt in hamstrings and gluteals of crossed-leg side. Hold _20-30__ seconds.  ?Repeat _2-3__ times per session. Do _3-4__ sessions per day. ? ?Hamstring Stretch ? ? ? ?Inhale and straighten spine. Exhale and lean forward toward extended leg keeping back straight and leading with chest. Hold position for _20-30__ seconds. Inhale and come back to center. Repeat with other leg extended. ?Repeat _2-3__ times, alternating legs. Do _3-4__ times per day. ? ?Quadricep Stretch ? ? ? ?Holding support, grasp right ankle. Gently pull foot toward buttocks until a stretch is felt on the upper front of leg. Hold _20-30__ seconds. ?Repeat _2-3___ times. Do _3-4___ sessions per day. ? ? ?Cancer Rehab [id number] ? ?
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
## 2021-07-26 ENCOUNTER — Ambulatory Visit: Payer: Medicare Other

## 2021-07-26 DIAGNOSIS — M6281 Muscle weakness (generalized): Secondary | ICD-10-CM

## 2021-07-26 DIAGNOSIS — R262 Difficulty in walking, not elsewhere classified: Secondary | ICD-10-CM | POA: Diagnosis not present

## 2021-07-26 DIAGNOSIS — R293 Abnormal posture: Secondary | ICD-10-CM | POA: Diagnosis not present

## 2021-07-26 DIAGNOSIS — C50411 Malignant neoplasm of upper-outer quadrant of right female breast: Secondary | ICD-10-CM | POA: Diagnosis not present
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
## 2021-07-28 ENCOUNTER — Ambulatory Visit: Payer: Medicare Other

## 2021-07-28 DIAGNOSIS — M6281 Muscle weakness (generalized): Secondary | ICD-10-CM

## 2021-07-28 DIAGNOSIS — C50411 Malignant neoplasm of upper-outer quadrant of right female breast: Secondary | ICD-10-CM

## 2021-07-28 DIAGNOSIS — R293 Abnormal posture: Secondary | ICD-10-CM

## 2021-07-28 DIAGNOSIS — R262 Difficulty in walking, not elsewhere classified: Secondary | ICD-10-CM
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
## 2021-07-29 ENCOUNTER — Other Ambulatory Visit: Payer: Self-pay | Admitting: Family

## 2021-07-30 DIAGNOSIS — Z23 Encounter for immunization: Secondary | ICD-10-CM | POA: Diagnosis not present

## 2021-08-02 ENCOUNTER — Ambulatory Visit: Payer: Medicare Other | Attending: Hematology and Oncology

## 2021-08-02 DIAGNOSIS — C50411 Malignant neoplasm of upper-outer quadrant of right female breast: Secondary | ICD-10-CM | POA: Insufficient documentation

## 2021-08-02 DIAGNOSIS — R262 Difficulty in walking, not elsewhere classified: Secondary | ICD-10-CM | POA: Diagnosis not present

## 2021-08-02 DIAGNOSIS — M6281 Muscle weakness (generalized): Secondary | ICD-10-CM | POA: Insufficient documentation

## 2021-08-02 DIAGNOSIS — R293 Abnormal posture: Secondary | ICD-10-CM | POA: Insufficient documentation
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
## 2021-08-04 ENCOUNTER — Ambulatory Visit: Payer: Medicare Other | Admitting: Rehabilitation

## 2021-08-04 ENCOUNTER — Encounter: Payer: Self-pay | Admitting: Rehabilitation

## 2021-08-04 DIAGNOSIS — C50411 Malignant neoplasm of upper-outer quadrant of right female breast: Secondary | ICD-10-CM | POA: Diagnosis not present

## 2021-08-04 DIAGNOSIS — R262 Difficulty in walking, not elsewhere classified: Secondary | ICD-10-CM | POA: Diagnosis not present

## 2021-08-04 DIAGNOSIS — M6281 Muscle weakness (generalized): Secondary | ICD-10-CM | POA: Diagnosis not present

## 2021-08-04 DIAGNOSIS — R293 Abnormal posture: Secondary | ICD-10-CM
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
## 2021-08-09 ENCOUNTER — Ambulatory Visit: Payer: Medicare Other

## 2021-08-09 DIAGNOSIS — C50411 Malignant neoplasm of upper-outer quadrant of right female breast: Secondary | ICD-10-CM

## 2021-08-09 DIAGNOSIS — M6281 Muscle weakness (generalized): Secondary | ICD-10-CM | POA: Diagnosis not present

## 2021-08-09 DIAGNOSIS — R293 Abnormal posture: Secondary | ICD-10-CM

## 2021-08-09 DIAGNOSIS — R262 Difficulty in walking, not elsewhere classified: Secondary | ICD-10-CM | POA: Diagnosis not present
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
## 2021-08-11 ENCOUNTER — Ambulatory Visit: Payer: Medicare Other

## 2021-08-11 DIAGNOSIS — R293 Abnormal posture: Secondary | ICD-10-CM | POA: Diagnosis not present

## 2021-08-11 DIAGNOSIS — C50411 Malignant neoplasm of upper-outer quadrant of right female breast: Secondary | ICD-10-CM

## 2021-08-11 DIAGNOSIS — M6281 Muscle weakness (generalized): Secondary | ICD-10-CM | POA: Diagnosis not present

## 2021-08-11 DIAGNOSIS — R262 Difficulty in walking, not elsewhere classified: Secondary | ICD-10-CM
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
## 2021-08-16 ENCOUNTER — Ambulatory Visit: Payer: Medicare Other

## 2021-08-16 DIAGNOSIS — M6281 Muscle weakness (generalized): Secondary | ICD-10-CM

## 2021-08-16 DIAGNOSIS — R262 Difficulty in walking, not elsewhere classified: Secondary | ICD-10-CM | POA: Diagnosis not present

## 2021-08-16 DIAGNOSIS — C50411 Malignant neoplasm of upper-outer quadrant of right female breast: Secondary | ICD-10-CM | POA: Diagnosis not present

## 2021-08-16 DIAGNOSIS — R293 Abnormal posture: Secondary | ICD-10-CM | POA: Diagnosis not present
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
## 2021-08-18 ENCOUNTER — Ambulatory Visit: Payer: Medicare Other

## 2021-08-18 DIAGNOSIS — M6281 Muscle weakness (generalized): Secondary | ICD-10-CM | POA: Diagnosis not present

## 2021-08-18 DIAGNOSIS — R293 Abnormal posture: Secondary | ICD-10-CM | POA: Diagnosis not present

## 2021-08-18 DIAGNOSIS — C50411 Malignant neoplasm of upper-outer quadrant of right female breast: Secondary | ICD-10-CM

## 2021-08-18 DIAGNOSIS — R262 Difficulty in walking, not elsewhere classified: Secondary | ICD-10-CM
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
## 2021-08-25 DIAGNOSIS — H34832 Tributary (branch) retinal vein occlusion, left eye, with macular edema: Secondary | ICD-10-CM | POA: Diagnosis not present

## 2021-08-25 DIAGNOSIS — H3561 Retinal hemorrhage, right eye: Secondary | ICD-10-CM | POA: Diagnosis not present

## 2021-08-25 DIAGNOSIS — H34811 Central retinal vein occlusion, right eye, with macular edema: Secondary | ICD-10-CM | POA: Diagnosis not present

## 2021-08-25 DIAGNOSIS — H35372 Puckering of macula, left eye: Secondary | ICD-10-CM | POA: Diagnosis not present

## 2021-08-25 DIAGNOSIS — H3582 Retinal ischemia: Secondary | ICD-10-CM | POA: Diagnosis not present

## 2021-08-30 ENCOUNTER — Telehealth: Payer: Self-pay | Admitting: *Deleted

## 2021-08-30 ENCOUNTER — Ambulatory Visit: Payer: Medicare Other

## 2021-08-30 DIAGNOSIS — C50411 Malignant neoplasm of upper-outer quadrant of right female breast: Secondary | ICD-10-CM | POA: Diagnosis not present

## 2021-08-30 DIAGNOSIS — M6281 Muscle weakness (generalized): Secondary | ICD-10-CM | POA: Diagnosis not present

## 2021-08-30 DIAGNOSIS — R262 Difficulty in walking, not elsewhere classified: Secondary | ICD-10-CM

## 2021-08-30 DIAGNOSIS — R293 Abnormal posture: Secondary | ICD-10-CM

## 2021-08-30 NOTE — Addendum Note (Signed)
Addended by: Claris Pong on: 08/30/2021 12:48 PM   Modules accepted: Orders

## 2021-08-30 NOTE — Telephone Encounter (Signed)
Received call from pt requesting orders be placed for shower chair and stair lift.  Pt states she continues to experience balance issues related to metastatic breast cancer. RN reviewed with MD and orders placed through [facility] for shower chair.  RN also contact Zack with [employer] [phone]) regarding stair lift.  Zack states pt insurance pay not cover but he will investigate further and let our office know. Pt educated and verbalized understanding.
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
## 2021-09-01 ENCOUNTER — Ambulatory Visit: Payer: Medicare Other | Attending: Hematology and Oncology

## 2021-09-01 DIAGNOSIS — M6281 Muscle weakness (generalized): Secondary | ICD-10-CM | POA: Insufficient documentation

## 2021-09-01 DIAGNOSIS — R262 Difficulty in walking, not elsewhere classified: Secondary | ICD-10-CM | POA: Diagnosis not present

## 2021-09-01 DIAGNOSIS — R293 Abnormal posture: Secondary | ICD-10-CM | POA: Insufficient documentation

## 2021-09-01 DIAGNOSIS — C50411 Malignant neoplasm of upper-outer quadrant of right female breast: Secondary | ICD-10-CM | POA: Insufficient documentation
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
## 2021-09-06 ENCOUNTER — Ambulatory Visit: Payer: Medicare Other

## 2021-09-06 DIAGNOSIS — C50411 Malignant neoplasm of upper-outer quadrant of right female breast: Secondary | ICD-10-CM

## 2021-09-06 DIAGNOSIS — R293 Abnormal posture: Secondary | ICD-10-CM

## 2021-09-06 DIAGNOSIS — M6281 Muscle weakness (generalized): Secondary | ICD-10-CM | POA: Diagnosis not present

## 2021-09-06 DIAGNOSIS — R262 Difficulty in walking, not elsewhere classified: Secondary | ICD-10-CM
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
## 2021-09-08 ENCOUNTER — Encounter: Payer: Self-pay | Admitting: Rehabilitation

## 2021-09-08 ENCOUNTER — Ambulatory Visit: Payer: Medicare Other | Admitting: Rehabilitation

## 2021-09-08 DIAGNOSIS — C50411 Malignant neoplasm of upper-outer quadrant of right female breast: Secondary | ICD-10-CM

## 2021-09-08 DIAGNOSIS — M6281 Muscle weakness (generalized): Secondary | ICD-10-CM | POA: Diagnosis not present

## 2021-09-08 DIAGNOSIS — R293 Abnormal posture: Secondary | ICD-10-CM | POA: Diagnosis not present

## 2021-09-08 DIAGNOSIS — R262 Difficulty in walking, not elsewhere classified: Secondary | ICD-10-CM | POA: Diagnosis not present
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
## 2021-09-13 ENCOUNTER — Ambulatory Visit: Payer: Medicare Other | Admitting: Rehabilitation

## 2021-09-13 ENCOUNTER — Encounter: Payer: Self-pay | Admitting: Rehabilitation

## 2021-09-13 DIAGNOSIS — R293 Abnormal posture: Secondary | ICD-10-CM

## 2021-09-13 DIAGNOSIS — C50411 Malignant neoplasm of upper-outer quadrant of right female breast: Secondary | ICD-10-CM | POA: Diagnosis not present

## 2021-09-13 DIAGNOSIS — M6281 Muscle weakness (generalized): Secondary | ICD-10-CM

## 2021-09-13 DIAGNOSIS — R262 Difficulty in walking, not elsewhere classified: Secondary | ICD-10-CM | POA: Diagnosis not present
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
## 2021-09-15 ENCOUNTER — Encounter: Payer: Self-pay | Admitting: Rehabilitation

## 2021-09-15 ENCOUNTER — Ambulatory Visit: Payer: Medicare Other | Admitting: Rehabilitation

## 2021-09-15 DIAGNOSIS — R293 Abnormal posture: Secondary | ICD-10-CM

## 2021-09-15 DIAGNOSIS — R262 Difficulty in walking, not elsewhere classified: Secondary | ICD-10-CM

## 2021-09-15 DIAGNOSIS — C50411 Malignant neoplasm of upper-outer quadrant of right female breast: Secondary | ICD-10-CM | POA: Diagnosis not present

## 2021-09-15 DIAGNOSIS — M6281 Muscle weakness (generalized): Secondary | ICD-10-CM
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
## 2021-09-19 ENCOUNTER — Encounter: Payer: Self-pay | Admitting: Family

## 2021-09-20 ENCOUNTER — Ambulatory Visit: Payer: Medicare Other

## 2021-09-20 DIAGNOSIS — M6281 Muscle weakness (generalized): Secondary | ICD-10-CM

## 2021-09-20 DIAGNOSIS — R262 Difficulty in walking, not elsewhere classified: Secondary | ICD-10-CM | POA: Diagnosis not present

## 2021-09-20 DIAGNOSIS — R293 Abnormal posture: Secondary | ICD-10-CM

## 2021-09-20 DIAGNOSIS — C50411 Malignant neoplasm of upper-outer quadrant of right female breast: Secondary | ICD-10-CM | POA: Diagnosis not present
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
## 2021-09-22 ENCOUNTER — Ambulatory Visit: Payer: Medicare Other | Admitting: Rehabilitation

## 2021-09-22 DIAGNOSIS — H34811 Central retinal vein occlusion, right eye, with macular edema: Secondary | ICD-10-CM | POA: Diagnosis not present

## 2021-09-22 DIAGNOSIS — H34832 Tributary (branch) retinal vein occlusion, left eye, with macular edema: Secondary | ICD-10-CM | POA: Diagnosis not present

## 2021-09-26 DIAGNOSIS — I10 Essential (primary) hypertension: Secondary | ICD-10-CM | POA: Diagnosis not present

## 2021-09-26 DIAGNOSIS — Z03818 Encounter for observation for suspected exposure to other biological agents ruled out: Secondary | ICD-10-CM | POA: Diagnosis not present

## 2021-09-26 DIAGNOSIS — Z20822 Contact with and (suspected) exposure to covid-19: Secondary | ICD-10-CM | POA: Diagnosis not present

## 2021-09-26 DIAGNOSIS — J00 Acute nasopharyngitis [common cold]: Secondary | ICD-10-CM | POA: Diagnosis not present

## 2021-09-27 ENCOUNTER — Ambulatory Visit: Payer: Medicare Other

## 2021-09-27 ENCOUNTER — Encounter: Payer: Self-pay | Admitting: Hematology and Oncology

## 2021-09-29 ENCOUNTER — Ambulatory Visit: Payer: Medicare Other

## 2021-10-09 ENCOUNTER — Encounter: Payer: Self-pay | Admitting: *Deleted

## 2021-10-11 ENCOUNTER — Encounter: Payer: Self-pay | Admitting: Family

## 2021-10-12 ENCOUNTER — Encounter: Payer: Self-pay | Admitting: Hematology and Oncology

## 2021-10-20 DIAGNOSIS — H3582 Retinal ischemia: Secondary | ICD-10-CM | POA: Diagnosis not present

## 2021-10-20 DIAGNOSIS — H34832 Tributary (branch) retinal vein occlusion, left eye, with macular edema: Secondary | ICD-10-CM | POA: Diagnosis not present

## 2021-10-20 DIAGNOSIS — H34811 Central retinal vein occlusion, right eye, with macular edema: Secondary | ICD-10-CM | POA: Diagnosis not present

## 2021-10-20 DIAGNOSIS — H35372 Puckering of macula, left eye: Secondary | ICD-10-CM | POA: Diagnosis not present

## 2021-10-25 ENCOUNTER — Ambulatory Visit: Payer: Medicare Other | Attending: Hematology and Oncology | Admitting: Rehabilitation

## 2021-10-25 ENCOUNTER — Encounter: Payer: Self-pay | Admitting: Rehabilitation

## 2021-10-25 DIAGNOSIS — R262 Difficulty in walking, not elsewhere classified: Secondary | ICD-10-CM | POA: Insufficient documentation

## 2021-10-25 DIAGNOSIS — R293 Abnormal posture: Secondary | ICD-10-CM | POA: Diagnosis not present

## 2021-10-25 DIAGNOSIS — M6281 Muscle weakness (generalized): Secondary | ICD-10-CM | POA: Insufficient documentation

## 2021-10-25 DIAGNOSIS — C50411 Malignant neoplasm of upper-outer quadrant of right female breast: Secondary | ICD-10-CM | POA: Insufficient documentation
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
## 2021-10-27 ENCOUNTER — Encounter: Payer: Self-pay | Admitting: Rehabilitation

## 2021-10-27 ENCOUNTER — Ambulatory Visit: Payer: Medicare Other | Admitting: Rehabilitation

## 2021-10-27 DIAGNOSIS — R262 Difficulty in walking, not elsewhere classified: Secondary | ICD-10-CM | POA: Diagnosis not present

## 2021-10-27 DIAGNOSIS — R293 Abnormal posture: Secondary | ICD-10-CM

## 2021-10-27 DIAGNOSIS — C50411 Malignant neoplasm of upper-outer quadrant of right female breast: Secondary | ICD-10-CM

## 2021-10-27 DIAGNOSIS — M6281 Muscle weakness (generalized): Secondary | ICD-10-CM | POA: Diagnosis not present
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
## 2021-10-30 DIAGNOSIS — E119 Type 2 diabetes mellitus without complications: Secondary | ICD-10-CM | POA: Diagnosis not present

## 2021-10-30 DIAGNOSIS — Z7984 Long term (current) use of oral hypoglycemic drugs: Secondary | ICD-10-CM | POA: Diagnosis not present

## 2021-11-08 ENCOUNTER — Encounter: Payer: Self-pay | Admitting: Hematology and Oncology

## 2021-11-08 ENCOUNTER — Other Ambulatory Visit: Payer: Self-pay | Admitting: Hematology and Oncology

## 2021-11-20 ENCOUNTER — Encounter: Payer: Self-pay | Admitting: Family

## 2021-11-21 NOTE — Telephone Encounter (Signed)
I have called the pt and relayed the message via voice mail since she did not answer the phone.

## 2021-11-22 DIAGNOSIS — H35372 Puckering of macula, left eye: Secondary | ICD-10-CM | POA: Diagnosis not present

## 2021-11-22 DIAGNOSIS — H3582 Retinal ischemia: Secondary | ICD-10-CM | POA: Diagnosis not present

## 2021-11-22 DIAGNOSIS — H34811 Central retinal vein occlusion, right eye, with macular edema: Secondary | ICD-10-CM | POA: Diagnosis not present

## 2021-11-22 DIAGNOSIS — H43811 Vitreous degeneration, right eye: Secondary | ICD-10-CM | POA: Diagnosis not present

## 2021-11-22 DIAGNOSIS — H34832 Tributary (branch) retinal vein occlusion, left eye, with macular edema: Secondary | ICD-10-CM | POA: Diagnosis not present

## 2021-11-22 DIAGNOSIS — H3561 Retinal hemorrhage, right eye: Secondary | ICD-10-CM | POA: Diagnosis not present

## 2021-11-23 ENCOUNTER — Encounter: Payer: Self-pay | Admitting: Hematology and Oncology

## 2021-11-23 ENCOUNTER — Encounter: Payer: Self-pay | Admitting: Family

## 2021-12-19 ENCOUNTER — Encounter: Payer: Self-pay | Admitting: Hematology and Oncology

## 2021-12-22 DIAGNOSIS — H34832 Tributary (branch) retinal vein occlusion, left eye, with macular edema: Secondary | ICD-10-CM | POA: Diagnosis not present

## 2021-12-22 DIAGNOSIS — H35372 Puckering of macula, left eye: Secondary | ICD-10-CM | POA: Diagnosis not present

## 2021-12-22 DIAGNOSIS — H43811 Vitreous degeneration, right eye: Secondary | ICD-10-CM | POA: Diagnosis not present

## 2021-12-22 DIAGNOSIS — H34811 Central retinal vein occlusion, right eye, with macular edema: Secondary | ICD-10-CM | POA: Diagnosis not present

## 2021-12-26 ENCOUNTER — Encounter: Payer: Self-pay | Admitting: *Deleted

## 2021-12-26 ENCOUNTER — Telehealth: Payer: Self-pay | Admitting: *Deleted

## 2021-12-26 NOTE — Patient Instructions (Signed)
Visit Information  Thank you for taking time to visit with me today. Please don't hesitate to contact me if I can be of assistance to you.   Following are the goals we discussed today:   Goals Addressed             This Visit's Progress    COMPLETED: Care Coordination Activities: No follow up required   On track    Care Coordination Interventions: Evaluation of current treatment plan related to breast CA with mets-brain/ HTN/ HLD and patient's adherence to plan as established by provider Advised patient to provide appropriate vaccination information to provider or CM team member at next visit Advised patient to provide updated vaccine dates to care providers: she reports her daughter has her vaccine list; she believes she has already had flu vaccine, but not sure; reports obtained COVID booster vaccine "2 or 3 months ago;" importance of confirming flu vaccine for 2023-24 flu/ winter season discussed with patient Reviewed scheduled/upcoming provider appointments including 03/16/22- MRI brain; 03/19/22- [facility]/ allergist Advised patient to discuss obtaining prescription for shingles vaccine if indicated with provider Assessed social determinant of health barriers Provided education around basic infection prevention measures for upcoming 2023-24 flu/ winter season and encouraged patient to take efforts to prevent and avoid respiratory infections  Confirmed patient attended Medicare Annual Wellness Visit on 07/11/21          If you are experiencing a [place] or [place] or need someone to talk to, please  call the Suicide and Crisis Lifeline: 988 call the [place] National Suicide Prevention Lifeline: (309) 408-9860 or TTY: 778-753-7512 TTY 747 563 0214) to talk to a trained counselor call 1-800-273-TALK (toll free, 24 hour hotline) go to [facility] [address] [place] [phone]) call [place]:  [phone] call 911   Patient verbalizes understanding of instructions and care plan provided today and agrees to view in [place]. Active MyChart status and patient understanding of how to access instructions and care plan via MyChart confirmed with patient.     No further follow up required: no further or ongoing care coordination needs identified today  Oneta Rack, RN, BSN, CCRN Alumnus RN CM Care Coordination/ Transition of [facility] [phone]: direct office

## 2021-12-26 NOTE — Patient Outreach (Signed)
  Care Coordination   Initial Visit Note   12/26/2021 Name: Tasha Ortiz MRN: [id number] DOB: [birth year]  Tasha Ortiz is a 66 y.o. year old female who sees [name], [place] for primary care. I spoke with  [name] by phone today.  What matters to the patients health and wellness today?  "I am doing pretty good; my daughters take good care of me and I am doing fine.  I had my COVID booster vaccine a couple of months ago and I am pretty sure I have already had my flu vaccine for this year.  I am taking all of my medicine the way I am supposed to.  Everything is going fine"  No further or ongoing care coordination needs identified today    Goals Addressed             This Visit's Progress    COMPLETED: Care Coordination Activities: No follow up required   On track    Care Coordination Interventions: Evaluation of current treatment plan related to breast CA with mets-brain/ HTN/ HLD and patient's adherence to plan as established by provider Advised patient to provide appropriate vaccination information to provider or CM team member at next visit Advised patient to provide updated vaccine dates to care providers: she reports her daughter has her vaccine list; she believes she has already had flu vaccine, but not sure; reports obtained COVID booster vaccine "2 or 3 months ago;" importance of confirming flu vaccine for 2023-24 flu/ winter season discussed with patient Reviewed scheduled/upcoming provider appointments including 03/16/22- MRI brain; 03/19/22- [place]/ allergist Advised patient to discuss obtaining prescription for shingles vaccine if indicated with provider Assessed social determinant of health barriers Provided education around basic infection prevention measures for upcoming 2023-24 flu/ winter season and encouraged patient to take efforts to prevent and avoid respiratory infections  Confirmed patient attended Medicare Annual Wellness Visit on  07/11/21          SDOH assessments and interventions completed:  Yes  SDOH Interventions Today    Flowsheet Row Most Recent Value  SDOH Interventions   Food Insecurity Interventions Intervention Not Indicated  Housing Interventions Intervention Not Indicated  [currently resides with her 2 daughters]  Transportation Interventions Intervention Not Indicated  [daughters provide transportation]       Care Coordination Interventions Activated:  Yes  Care Coordination Interventions:  Yes, provided   Follow up plan: No further intervention required.   Encounter Outcome:  Pt. Visit Completed   Oneta Rack, RN, BSN, CCRN Alumnus RN CM Care Coordination/ Transition of [name] [phone]: direct office

## 2021-12-28 ENCOUNTER — Telehealth: Payer: Self-pay | Admitting: Family

## 2021-12-28 NOTE — Telephone Encounter (Signed)
EMMI call for A1C but said that she had it checked in April by her diabetes doc with [facility] so patient declines having it checked by [place].

## 2022-01-02 NOTE — Telephone Encounter (Signed)
Error

## 2022-01-28 ENCOUNTER — Encounter: Payer: Self-pay | Admitting: Hematology and Oncology

## 2022-01-28 ENCOUNTER — Other Ambulatory Visit: Payer: Self-pay | Admitting: Family

## 2022-01-29 NOTE — Telephone Encounter (Signed)
I have called the pt and she stated that she does currently still take the potassium. Her last levels a year ago was 3.7.   She has not been seen since 01/03/21. I have informed her that she will need an appointment to follow up and see how she is doing. She stated understanding and will give [place] a call back with the time she can come in since she has to ask her daughter when she will be able to bring pt to appt.

## 2022-01-30 DIAGNOSIS — H43811 Vitreous degeneration, right eye: Secondary | ICD-10-CM | POA: Diagnosis not present

## 2022-01-30 DIAGNOSIS — H34832 Tributary (branch) retinal vein occlusion, left eye, with macular edema: Secondary | ICD-10-CM | POA: Diagnosis not present

## 2022-01-30 DIAGNOSIS — H3582 Retinal ischemia: Secondary | ICD-10-CM | POA: Diagnosis not present

## 2022-01-30 DIAGNOSIS — H35372 Puckering of macula, left eye: Secondary | ICD-10-CM | POA: Diagnosis not present

## 2022-01-30 DIAGNOSIS — H34811 Central retinal vein occlusion, right eye, with macular edema: Secondary | ICD-10-CM | POA: Diagnosis not present

## 2022-01-30 DIAGNOSIS — H3561 Retinal hemorrhage, right eye: Secondary | ICD-10-CM | POA: Diagnosis not present

## 2022-02-06 ENCOUNTER — Other Ambulatory Visit: Payer: Self-pay | Admitting: Radiation Therapy

## 2022-02-14 ENCOUNTER — Other Ambulatory Visit: Payer: Self-pay | Admitting: Family

## 2022-02-16 ENCOUNTER — Ambulatory Visit (INDEPENDENT_AMBULATORY_CARE_PROVIDER_SITE_OTHER): Payer: Medicare Other | Admitting: Family

## 2022-02-16 VITALS — BP 120/70 | HR 69 | Temp 97.6°F | Resp 18 | Ht 64.0 in | Wt 153.8 lb

## 2022-02-16 DIAGNOSIS — E039 Hypothyroidism, unspecified: Secondary | ICD-10-CM

## 2022-02-16 DIAGNOSIS — Z0000 Encounter for general adult medical examination without abnormal findings: Secondary | ICD-10-CM | POA: Diagnosis not present

## 2022-02-16 DIAGNOSIS — E1169 Type 2 diabetes mellitus with other specified complication: Secondary | ICD-10-CM

## 2022-02-16 DIAGNOSIS — M545 Low back pain, unspecified: Secondary | ICD-10-CM | POA: Diagnosis not present

## 2022-02-16 DIAGNOSIS — Z13220 Encounter for screening for lipoid disorders: Secondary | ICD-10-CM

## 2022-02-16 LAB — LIPID PANEL
Cholesterol: 216 mg/dL — ABNORMAL HIGH (ref 0–200)
HDL: 55.6 mg/dL (ref >39.00)
LDL Cholesterol: 144 mg/dL — ABNORMAL HIGH (ref 0–99)
NonHDL: 159.91
Total CHOL/HDL Ratio: 4
Triglycerides: 82 mg/dL (ref 0.0–149.0)
VLDL: 16.4 mg/dL (ref 0.0–40.0)

## 2022-02-16 LAB — CBC WITH DIFFERENTIAL/PLATELET
Basophils Absolute: 0.1 10*3/uL (ref 0.0–0.1)
Basophils Relative: 1.2 % (ref 0.0–3.0)
Eosinophils Absolute: 0.1 10*3/uL (ref 0.0–0.7)
Eosinophils Relative: 2.3 % (ref 0.0–5.0)
HCT: 45.7 % (ref 36.0–46.0)
Hemoglobin: 15 g/dL (ref 12.0–15.0)
Lymphocytes Relative: 36.3 % (ref 12.0–46.0)
Lymphs Abs: 2 10*3/uL (ref 0.7–4.0)
MCHC: 32.8 g/dL (ref 30.0–36.0)
MCV: 88.2 fl (ref 78.0–100.0)
Monocytes Absolute: 0.5 10*3/uL (ref 0.1–1.0)
Monocytes Relative: 8.6 % (ref 3.0–12.0)
Neutro Abs: 2.8 10*3/uL (ref 1.4–7.7)
Neutrophils Relative %: 51.6 % (ref 43.0–77.0)
Platelets: 278 10*3/uL (ref 150.0–400.0)
RBC: 5.18 Mil/uL — ABNORMAL HIGH (ref 3.87–5.11)
RDW: 14.3 % (ref 11.5–15.5)
WBC: 5.4 10*3/uL (ref 4.0–10.5)

## 2022-02-16 LAB — COMPREHENSIVE METABOLIC PANEL
ALT: 20 U/L (ref 0–35)
AST: 18 U/L (ref 0–37)
Albumin: 4.5 g/dL (ref 3.5–5.2)
Alkaline Phosphatase: 80 U/L (ref 39–117)
BUN: 12 mg/dL (ref 6–23)
CO2: 29 mEq/L (ref 19–32)
Calcium: 9.7 mg/dL (ref 8.4–10.5)
Chloride: 105 mEq/L (ref 96–112)
Creatinine, Ser: 0.67 mg/dL (ref 0.40–1.20)
GFR: 92.46 mL/min (ref >60.00)
Glucose, Bld: 146 mg/dL — ABNORMAL HIGH (ref 70–99)
Potassium: 3.8 mEq/L (ref 3.5–5.1)
Sodium: 142 mEq/L (ref 135–145)
Total Bilirubin: 0.4 mg/dL (ref 0.2–1.2)
Total Protein: 7.8 g/dL (ref 6.0–8.3)

## 2022-02-16 LAB — HEMOGLOBIN A1C: Hgb A1c MFr Bld: 7.5 % — ABNORMAL HIGH (ref 4.6–6.5)

## 2022-02-16 LAB — TSH: TSH: 4.86 u[IU]/mL (ref 0.35–5.50)

## 2022-02-16 MED ORDER — POTASSIUM CHLORIDE CRYS ER 10 MEQ PO TBCR: 10.0000 meq | EXTENDED_RELEASE_TABLET | Freq: Two times a day (BID) | ORAL | 3 refills | Status: DC

## 2022-02-16 MED ORDER — SERTRALINE HCL 50 MG PO TABS: ORAL_TABLET | ORAL | 3 refills | Status: DC

## 2022-02-16 MED ORDER — AMLODIPINE BESYLATE 10 MG PO TABS: 10.0000 mg | ORAL_TABLET | Freq: Every day | ORAL | 3 refills | Status: DC

## 2022-02-16 MED ORDER — LEVOTHYROXINE SODIUM 25 MCG PO TABS: 25.0000 ug | ORAL_TABLET | Freq: Every day | ORAL | 3 refills | Status: DC

## 2022-02-16 MED ORDER — PRAVASTATIN SODIUM 40 MG PO TABS: 40.0000 mg | ORAL_TABLET | Freq: Every day | ORAL | 3 refills | Status: DC

## 2022-02-16 MED ORDER — FLUTICASONE PROPIONATE 50 MCG/ACT NA SUSP: 1.0000 | Freq: Every day | NASAL | 3 refills | Status: AC
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
## 2022-02-18 ENCOUNTER — Other Ambulatory Visit: Payer: Self-pay | Admitting: Family

## 2022-02-18 MED ORDER — LEVOTHYROXINE SODIUM 50 MCG PO TABS: 50.0000 ug | ORAL_TABLET | Freq: Every day | ORAL | 0 refills | Status: DC

## 2022-02-19 ENCOUNTER — Ambulatory Visit (INDEPENDENT_AMBULATORY_CARE_PROVIDER_SITE_OTHER)
Admission: RE | Admit: 2022-02-19 | Discharge: 2022-02-19 | Disposition: A | Discharge status: Home / Self Care | Payer: Medicare Other | Source: Ambulatory Visit | Attending: Family | Admitting: Family

## 2022-02-19 DIAGNOSIS — M545 Low back pain, unspecified: Secondary | ICD-10-CM

## 2022-02-19 MED ORDER — LEVOTHYROXINE SODIUM 50 MCG PO TABS: 50.0000 ug | ORAL_TABLET | Freq: Every day | ORAL | 0 refills | Status: DC

## 2022-02-19 NOTE — Progress Notes (Signed)
Medication resent to pt preferred pharmacy.

## 2022-02-19 NOTE — Addendum Note (Signed)
Addended by: Kittie Plater, Makhiya Coburn HUA on: 02/19/2022 09:18 AM   Modules accepted: Orders

## 2022-02-20 DIAGNOSIS — M546 Pain in thoracic spine: Secondary | ICD-10-CM | POA: Diagnosis not present

## 2022-02-20 DIAGNOSIS — M9905 Segmental and somatic dysfunction of pelvic region: Secondary | ICD-10-CM | POA: Diagnosis not present

## 2022-02-20 DIAGNOSIS — M5442 Lumbago with sciatica, left side: Secondary | ICD-10-CM | POA: Diagnosis not present

## 2022-02-20 DIAGNOSIS — M9902 Segmental and somatic dysfunction of thoracic region: Secondary | ICD-10-CM | POA: Diagnosis not present

## 2022-02-20 DIAGNOSIS — M9904 Segmental and somatic dysfunction of sacral region: Secondary | ICD-10-CM | POA: Diagnosis not present

## 2022-02-20 DIAGNOSIS — M9903 Segmental and somatic dysfunction of lumbar region: Secondary | ICD-10-CM | POA: Diagnosis not present

## 2022-02-28 DIAGNOSIS — C50911 Malignant neoplasm of unspecified site of right female breast: Secondary | ICD-10-CM | POA: Diagnosis not present

## 2022-03-09 DIAGNOSIS — M9902 Segmental and somatic dysfunction of thoracic region: Secondary | ICD-10-CM | POA: Diagnosis not present

## 2022-03-09 DIAGNOSIS — M9903 Segmental and somatic dysfunction of lumbar region: Secondary | ICD-10-CM | POA: Diagnosis not present

## 2022-03-16 ENCOUNTER — Ambulatory Visit
Admission: RE | Admit: 2022-03-16 | Discharge: 2022-03-16 | Disposition: A | Discharge status: Home / Self Care | Payer: Medicare Other | Source: Ambulatory Visit | Attending: Internal Medicine | Admitting: Internal Medicine

## 2022-03-16 DIAGNOSIS — C7981 Secondary malignant neoplasm of breast: Secondary | ICD-10-CM | POA: Diagnosis not present

## 2022-03-16 DIAGNOSIS — C7931 Secondary malignant neoplasm of brain: Secondary | ICD-10-CM

## 2022-03-16 MED ORDER — GADOPICLENOL 0.5 MMOL/ML IV SOLN
7.0000 mL | Freq: Once | INTRAVENOUS | Status: AC | PRN
  Administered 2022-03-16: 7 mL via INTRAVENOUS

## 2022-03-19 ENCOUNTER — Inpatient Hospital Stay: Payer: Medicare Other

## 2022-03-19 ENCOUNTER — Inpatient Hospital Stay: Payer: Medicare Other | Attending: Internal Medicine | Admitting: Internal Medicine

## 2022-03-19 VITALS — BP 138/83 | HR 67 | Temp 97.8°F | Resp 17 | Ht 64.0 in | Wt 154.6 lb

## 2022-03-19 DIAGNOSIS — Z79811 Long term (current) use of aromatase inhibitors: Secondary | ICD-10-CM | POA: Diagnosis not present

## 2022-03-19 DIAGNOSIS — C7931 Secondary malignant neoplasm of brain: Secondary | ICD-10-CM | POA: Insufficient documentation

## 2022-03-19 DIAGNOSIS — Z808 Family history of malignant neoplasm of other organs or systems: Secondary | ICD-10-CM | POA: Diagnosis not present

## 2022-03-19 DIAGNOSIS — Z170 Estrogen receptor positive status [ER+]: Secondary | ICD-10-CM | POA: Diagnosis not present

## 2022-03-19 DIAGNOSIS — Z803 Family history of malignant neoplasm of breast: Secondary | ICD-10-CM | POA: Diagnosis not present

## 2022-03-19 DIAGNOSIS — C50411 Malignant neoplasm of upper-outer quadrant of right female breast: Secondary | ICD-10-CM | POA: Insufficient documentation

## 2022-03-19 DIAGNOSIS — Z87891 Personal history of nicotine dependence: Secondary | ICD-10-CM | POA: Diagnosis not present

## 2022-03-19 DIAGNOSIS — I10 Essential (primary) hypertension: Secondary | ICD-10-CM | POA: Insufficient documentation

## 2022-03-19 NOTE — Progress Notes (Signed)
[facility] [place], [place] [address] [phone]   Interval Evaluation  Date of Service: 03/19/22 Patient Name: Tasha Ortiz Patient MRN: [id number] Patient DOB: [birth year] Provider: Ventura Sellers, MD  Identifying Statement:  Tasha Ortiz is a 66 y.o. female with Malignant neoplasm metastatic to brain [facility]) [C79.31]   Primary Cancer: Breast ER+/PR+/HER-  Oncologic History: Oncology History  Primary cancer of upper outer quadrant of right female breast ([place])  04/16/2012 Initial Biopsy   Right breast mass biopsy invasive ductal carcinoma with abundant mucin in all the 3 masses, right axillary lymph node biopsy positive for IDC, ER 100% PR percent Ki-67 19%, HER-2 negative ratio 0.94   04/21/2012 Breast MRI   Right breast 3 masses 11o'clock position 1.8 cm, now 11:00 position 2 cm, and 10:00 position 2.3 cm with an abnormal level I right axillary lymph node 3 cm   05/20/2012 Surgery   Right breast mastectomy invasive ductal carcinoma grade 2; 1.9 cm, 2.5 cm, 1.8 cm, 1/17 LN positive, second and third massive grade 3   06/24/2012 - 10/07/2012 Chemotherapy   Adjuvant chemotherapy with Taxotere Cytoxan x6 cycles   10/16/2012 - 12/16/2012 Radiation Therapy   Adjuvant radiation therapy by Dr. [name]   01/07/2013 -  Anti-estrogen oral therapy   Tamoxifen 20 mg daily   11/19/2016 Imaging   MRI brain: 2.9 similar mass within the inferior vermis with surrounding edema and local mass effect partially effacing the fourth ventricle.    11/21/2016 Relapse/Recurrence   Brain biopsy: Metastatic ductal carcinoma the breast positive for ER PR, GATA 3, negative for PAX8 and TTF-1   11/21/2016 Surgery   Suboccipital craniotomy for gross total resection of cerebellar tumor by Dr. [name]     Interval History:  Tasha Ortiz presents today after recent MRI brain.  No new or progressive changes today. Continues to use a  walker at home and wheelchair "going out". She has completed physical therapy sessions.  No issues with new back pain or urge incontinence.     H+P: presents today to review her CNS disease burden from metastatic breast cancer, and associated neurologic deficits.  She initially described feeling "off balance" this summer, which led to an MRI in August demonstrating large posterior fossa metastasis.  This was resected on 11/21/16, and followed by post-op SRS to the resection cavity.  She describes clinical stability in the post-operative period, which was followed by significant decline during immediately following radiation, described as "inability to ambulate, dress self, feed self" due to poor balance and coordination.  These deficits have reportedly improved dramatically since that time- she is still reliant on a walker, but all other ADL's she is able to perform independently.  Occasionally she has bouts of vertigo, which are effectively treated with Meclizine.   Most recent MRI in January was stable.  She does acknowledge some neck and back pain recently along midline.  No urinary incontinence.  Dr. Lindi Adie had prescribed 27m decadron daily for appetite stimulation, which she says has helped considerably and would like to continue.  Medications: Current Outpatient Medications on File Prior to Visit  Medication Sig Dispense Refill   amLODipine (NORVASC) 10 MG tablet Take 1 tablet (10 mg total) by mouth daily. 90 tablet 3   anastrozole (ARIMIDEX) 1 MG tablet TAKE 1 TABLET BY MOUTH EVERY DAY 90 tablet 3   blood glucose meter kit and supplies KIT Dispense based on patient and insurance  preference. Use up to four times daily as directed. (FOR ICD-9 250.00, 250.01). 1 each 0   diclofenac Sodium (VOLTAREN) 1 % GEL Apply 2 g topically 4 (four) times daily. 100 g 0   dorzolamide-timolol (COSOPT) 22.3-6.8 MG/ML ophthalmic solution      fluticasone (FLONASE) 50 MCG/ACT nasal spray Place 1 spray into both  nostrils daily. 16 g 3   Lancets (ONETOUCH DELICA PLUS [id number]) MISC USE TO check blood sugar UP TO 4 TIMES DAILY     levothyroxine (SYNTHROID) 50 MCG tablet Take 1 tablet (50 mcg total) by mouth daily before breakfast. 90 tablet 0   metoCLOPramide (REGLAN) 5 MG tablet 1 tab twice daily before breakfast and supper. May increase to QID (before meals and at bedtime) if needed. 120 tablet 2   ONETOUCH ULTRA test strip      potassium chloride (KLOR-CON M10) 10 MEQ tablet Take 1 tablet (10 mEq total) by mouth 2 (two) times daily. 180 tablet 3   pravastatin (PRAVACHOL) 40 MG tablet Take 1 tablet (40 mg total) by mouth daily. 90 tablet 3   sertraline (ZOLOFT) 50 MG tablet Take 1 tablet daily 90 tablet 3   XARELTO 20 MG TABS tablet TAKE 1 TABLET BY MOUTH DAILY WITH SUPPER 90 tablet 1   [DISCONTINUED] Prochlorperazine Maleate (COMPAZINE PO) Take by mouth.     No current facility-administered medications on file prior to visit.    Allergies:  Allergies  Allergen Reactions   Lisinopril Swelling   Past Medical History:  Past Medical History:  Diagnosis Date   Acid reflux    Allergy    seasonal   Breast cancer ([place])    right,no iv or blood on right sid, chemo and radiation 2014   Family history of brain cancer    Family history of breast cancer    Heartburn    Hypercholesteremia    taken off chol meds Dec 2012   Hypertension    Hypertension    Personal history of chemotherapy 05/2012   rt breast   Personal history of radiation therapy 10/2012   rt breast   Past Surgical History:  Past Surgical History:  Procedure Laterality Date   BREAST BIOPSY Right 04/16/2012   BREAST SURGERY Right 05/20/12   Rt br mastectomy   CESAREAN SECTION  25 years ago   colonoscopy     COLONOSCOPY WITH PROPOFOL N/A 01/18/2015   Procedure: COLONOSCOPY WITH PROPOFOL;  Surgeon: Mauri Pole, MD;  Location: [facility];  Service: Endoscopy;  Laterality: N/A;   MASTECTOMY Right 05/2012   MODIFIED  MASTECTOMY Right 05/20/2012   Procedure: MODIFIED MASTECTOMY;  Surgeon: Edward Jolly, MD;  Location: [facility];  Service: General;  Laterality: Right;   PORTACATH PLACEMENT Left 05/20/2012   Procedure: INSERTION PORT-A-CATH;  Surgeon: Edward Jolly, MD;  Location: [facility];  Service: General;  Laterality: Left;   SUBOCCIPITAL CRANIECTOMY CERVICAL LAMINECTOMY N/A 11/21/2016   Procedure: SUBOCCIPITAL CRANIECTOMY RESECTION TUMOR;  Surgeon: Newman Pies, MD;  Location: [facility];  Service: Neurosurgery;  Laterality: N/A;   TUBAL LIGATION     Social History:  Social History   Socioeconomic History   Marital status: Divorced    Spouse name: Not on file   Number of children: 2   Years of education: 16   Highest education level: Not on file  Occupational History   Occupation: Pension scheme manager  Tobacco Use   Smoking status: Never   Smokeless tobacco: Former    Types: Loss adjuster, chartered  Quit date: 11/18/1996  Substance and Sexual Activity   Alcohol use: No    Alcohol/week: 0.0 standard drinks of alcohol   Drug use: No   Sexual activity: Never    Comment: menarche 76, P2, no HRT  Other Topics Concern   Not on file  Social History Narrative   Fun: Theodoro Kos   Denies religious beliefs effecting health care.    Social Determinants of Health   Financial Resource Strain: Low Risk  (07/11/2021)   Overall Financial Resource Strain (CARDIA)    Difficulty of Paying Living Expenses: Not hard at all  Food Insecurity: No Food Insecurity (12/26/2021)   Hunger Vital Sign    Worried About Running Out of Food in the Last Year: Never true    Ran Out of Food in the Last Year: Never true  Transportation Needs: No Transportation Needs (12/26/2021)   PRAPARE - Hydrologist (Medical): No    Lack of Transportation (Non-Medical): No  Physical Activity: Inactive (07/11/2021)   Exercise Vital Sign    Days of Exercise per Week: 0 days    Minutes of Exercise per Session: 0 min  Stress: No  Stress Concern Present (07/11/2021)   [place]    Feeling of Stress : Not at all  Social Connections: Moderately Isolated (07/11/2021)   Social Connection and Isolation Panel [NHANES]    Frequency of Communication with Friends and Family: More than three times a week    Frequency of Social Gatherings with Friends and Family: More than three times a week    Attends Religious Services: More than 4 times per year    Active Member of [employer] or Organizations: No    Attends Archivist Meetings: Never    Marital Status: Divorced  Human resources officer Violence: Not At Risk (07/11/2021)   Humiliation, Afraid, Rape, and Kick questionnaire    Fear of Current or Ex-Partner: No    Emotionally Abused: No    Physically Abused: No    Sexually Abused: No   Family History:  Family History  Problem Relation Age of Onset   Hypertension Mother    Aneurysm Mother 28   Brain cancer Sister 10   Breast cancer Cousin 61   Jaundice Father 38   Diabetes Daughter    Breast cancer Cousin 46   Breast cancer Cousin 50   Colon cancer Neg Hx     Review of Systems: Constitutional: Denies fevers, chills or abnormal weight loss Eyes: blurry distance vision Ears, nose, mouth, throat, and face: Denies mucositis or sore throat Respiratory: Denies cough, dyspnea or wheezes Cardiovascular: Denies palpitation, chest discomfort or lower extremity swelling Gastrointestinal:  Denies nausea, constipation, diarrhea GU: Denies dysuria or incontinence Skin: Denies abnormal skin rashes Neurological: Per HPI Musculoskeletal: midline back pain Behavioral/Psych: Denies anxiety, disturbance in thought content, and mood instability   Physical Exam: There were no vitals filed for this visit.  KPS: 70. General: Alert, cooperative, pleasant, in no acute distress.  In wheelchair Head: Craniotomy scar noted, dry and intact. EENT: No conjunctival injection  or scleral icterus. Oral mucosa moist Lungs: Resp effort normal Cardiac: Regular rate and rhythm Abdomen: Soft, non-distended abdomen Skin: No rashes cyanosis or petechiae. Extremities: No clubbing or edema  Neurologic Exam: Mental Status: Awake, alert, attentive to examiner. Oriented to self and environment. Language is fluent with intact comprehension.  Cranial Nerves: Visual acuity is grossly normal. Visual fields are full. Extra-ocular movements intact.  No ptosis. Face is symmetric, tongue midline. Motor: Tone and bulk are normal. Power is full in both arms and legs. Hyperreflexia noted in bilateral legs (3+) with sustained clonus in right > left ankle.  Arm reflexes normal. Mild dysmetria noted on bilateral finger to nose. Sensory: Intact to light touch and temperature Gait: Dystaxic  Labs: I have reviewed the data as listed    Component Value Date/Time   NA 142 02/16/2022 1100   NA 143 07/05/2014 1101   K 3.8 02/16/2022 1100   K 3.7 07/05/2014 1101   CL 105 02/16/2022 1100   CL 103 09/23/2012 1040   CO2 29 02/16/2022 1100   CO2 27 07/05/2014 1101   GLUCOSE 146 (H) 02/16/2022 1100   GLUCOSE 113 07/05/2014 1101   GLUCOSE 140 (H) 09/23/2012 1040   BUN 12 02/16/2022 1100   BUN 11.8 07/05/2014 1101   CREATININE 0.67 02/16/2022 1100   CREATININE 0.55 12/21/2019 1027   CREATININE 0.8 07/05/2014 1101   CALCIUM 9.7 02/16/2022 1100   CALCIUM 9.4 07/05/2014 1101   PROT 7.8 02/16/2022 1100   PROT 6.6 07/05/2014 1101   ALBUMIN 4.5 02/16/2022 1100   ALBUMIN 3.4 (L) 07/05/2014 1101   AST 18 02/16/2022 1100   AST 40 07/15/2019 0825   AST 18 07/05/2014 1101   ALT 20 02/16/2022 1100   ALT 39 07/15/2019 0825   ALT 15 07/05/2014 1101   ALKPHOS 80 02/16/2022 1100   ALKPHOS 45 07/05/2014 1101   BILITOT 0.4 02/16/2022 1100   BILITOT 0.4 07/15/2019 0825   BILITOT 0.21 07/05/2014 1101   GFRNONAA >60 12/01/2019 1248   GFRNONAA >60 07/15/2019 0825   GFRAA >60 12/01/2019 1248   GFRAA  >60 07/15/2019 0825   Lab Results  Component Value Date   WBC 5.4 02/16/2022   NEUTROABS 2.8 02/16/2022   HGB 15.0 02/16/2022   HCT 45.7 02/16/2022   MCV 88.2 02/16/2022   PLT 278.0 02/16/2022    Imaging:  [facility] Clinician Interpretation: I have personally reviewed the CNS images as listed.  My interpretation, in the context of the patient's clinical presentation, is stable disease  MR BRAIN W WO CONTRAST  Result Date: 03/17/2022 CLINICAL DATA:  Follow-up treated metastatic breast cancer. Previous surgery and S RS. EXAM: MRI HEAD WITHOUT AND WITH CONTRAST TECHNIQUE: Multiplanar, multiecho pulse sequences of the brain and surrounding structures were obtained without and with intravenous contrast. CONTRAST:  7 ccVueway COMPARISON:  03/17/2021 FINDINGS: Brain: Stable examination since last year. No abnormal diffusion finding. Previous occipital craniotomy for resection of a cerebellar mass first demonstrated in August of 2018. There is atrophy encephalomalacia throughout the region with adjacent gliosis, consistent with postsurgical and post treatment changes. No evidence of residual or recurrent tumor. Few small areas of enhancement are seen consistent with prior treatment. No mass-effect or progressive change. Otherwise, the brain shows some generalized volume loss with chronic small vessel type ischemic changes of the hemispheric deep and subcortical white matter. No large vessel territory stroke. No evidence of supratentorial mass, hemorrhage, hydrocephalus or extra-axial collection. Vascular: Major vessels at the base of the brain show flow. Skull and upper cervical spine: Negative Sinuses/Orbits: Clear/normal Other: None IMPRESSION: 1. Stable examination since last year. Previous occipital craniotomy for resection of a cerebellar mass. No evidence of residual or recurrent tumor. Atrophy and gliosis of the cerebellum consistent with post treatment changes. Small areas of post treatment enhancement  are unchanged. 2. Chronic small-vessel ischemic changes of the cerebral hemispheric white matter.  Electronically Signed   By: Nelson Chimes M.D.   On: 03/17/2022 13:17   DG Lumbar Spine Complete  Result Date: 02/19/2022 CLINICAL DATA:  LOWER back pain.  Symptoms for 3-4 weeks. EXAM: LUMBAR SPINE - COMPLETE 4+ VIEW COMPARISON:  CT of the abdomen and pelvis 05/29/2021 FINDINGS: There is no evidence of lumbar spine fracture. Alignment is normal. Intervertebral disc spaces are maintained. IMPRESSION: Negative. Electronically Signed   By: Nolon Nations M.D.   On: 02/19/2022 14:39     Assessment/Plan Malignant neoplasm metastatic to brain [facility])  Ms. Nelson is clinically and radiographically stable today.  Continues to maintain functional independence.  We appreciate the opportunity to participate in the care of Tasha Ortiz.    We ask that Tasha Ortiz return to clinic in 12 months following next brain MRI, or sooner as needed.  All questions were answered. The patient knows to call the clinic with any problems, questions or concerns. No barriers to learning were detected.  I have spent a total of 30 minutes of face-to-face and non-face-to-face time, excluding clinical staff time, preparing to see patient, ordering tests and/or medications, counseling the patient, and independently interpreting results and communicating results to the patient/family/caregiver    Ventura Sellers, MD Medical Director of Neuro-Oncology [facility] [place] 03/19/22 8:58 AM

## 2022-03-22 ENCOUNTER — Encounter: Payer: Self-pay | Admitting: Hematology and Oncology

## 2022-03-30 DIAGNOSIS — H34813 Central retinal vein occlusion, bilateral, with macular edema: Secondary | ICD-10-CM | POA: Diagnosis not present

## 2022-03-30 DIAGNOSIS — H35372 Puckering of macula, left eye: Secondary | ICD-10-CM | POA: Diagnosis not present

## 2022-03-30 DIAGNOSIS — H43811 Vitreous degeneration, right eye: Secondary | ICD-10-CM | POA: Diagnosis not present

## 2022-04-03 DIAGNOSIS — M9902 Segmental and somatic dysfunction of thoracic region: Secondary | ICD-10-CM | POA: Diagnosis not present

## 2022-04-03 DIAGNOSIS — M9903 Segmental and somatic dysfunction of lumbar region: Secondary | ICD-10-CM | POA: Diagnosis not present

## 2022-04-03 DIAGNOSIS — M5442 Lumbago with sciatica, left side: Secondary | ICD-10-CM | POA: Diagnosis not present

## 2022-04-05 DIAGNOSIS — M9902 Segmental and somatic dysfunction of thoracic region: Secondary | ICD-10-CM | POA: Diagnosis not present

## 2022-04-05 DIAGNOSIS — M9903 Segmental and somatic dysfunction of lumbar region: Secondary | ICD-10-CM | POA: Diagnosis not present

## 2022-04-06 DIAGNOSIS — M9902 Segmental and somatic dysfunction of thoracic region: Secondary | ICD-10-CM | POA: Diagnosis not present

## 2022-04-06 DIAGNOSIS — M9903 Segmental and somatic dysfunction of lumbar region: Secondary | ICD-10-CM | POA: Diagnosis not present

## 2022-04-11 DIAGNOSIS — M9902 Segmental and somatic dysfunction of thoracic region: Secondary | ICD-10-CM | POA: Diagnosis not present

## 2022-04-11 DIAGNOSIS — M9903 Segmental and somatic dysfunction of lumbar region: Secondary | ICD-10-CM | POA: Diagnosis not present

## 2022-04-12 DIAGNOSIS — M9903 Segmental and somatic dysfunction of lumbar region: Secondary | ICD-10-CM | POA: Diagnosis not present

## 2022-04-12 DIAGNOSIS — M9902 Segmental and somatic dysfunction of thoracic region: Secondary | ICD-10-CM | POA: Diagnosis not present

## 2022-04-13 DIAGNOSIS — M9902 Segmental and somatic dysfunction of thoracic region: Secondary | ICD-10-CM | POA: Diagnosis not present

## 2022-04-13 DIAGNOSIS — M9903 Segmental and somatic dysfunction of lumbar region: Secondary | ICD-10-CM | POA: Diagnosis not present

## 2022-04-30 ENCOUNTER — Other Ambulatory Visit: Payer: Self-pay | Admitting: Hematology and Oncology

## 2022-05-01 DIAGNOSIS — M9902 Segmental and somatic dysfunction of thoracic region: Secondary | ICD-10-CM | POA: Diagnosis not present

## 2022-05-01 DIAGNOSIS — M9903 Segmental and somatic dysfunction of lumbar region: Secondary | ICD-10-CM | POA: Diagnosis not present

## 2022-05-02 DIAGNOSIS — I10 Essential (primary) hypertension: Secondary | ICD-10-CM | POA: Diagnosis not present

## 2022-05-02 DIAGNOSIS — Z7984 Long term (current) use of oral hypoglycemic drugs: Secondary | ICD-10-CM | POA: Diagnosis not present

## 2022-05-02 DIAGNOSIS — E1165 Type 2 diabetes mellitus with hyperglycemia: Secondary | ICD-10-CM | POA: Diagnosis not present

## 2022-05-02 DIAGNOSIS — M9903 Segmental and somatic dysfunction of lumbar region: Secondary | ICD-10-CM | POA: Diagnosis not present

## 2022-05-02 DIAGNOSIS — Z8673 Personal history of transient ischemic attack (TIA), and cerebral infarction without residual deficits: Secondary | ICD-10-CM | POA: Diagnosis not present

## 2022-05-02 DIAGNOSIS — E11319 Type 2 diabetes mellitus with unspecified diabetic retinopathy without macular edema: Secondary | ICD-10-CM | POA: Diagnosis not present

## 2022-05-02 DIAGNOSIS — E1159 Type 2 diabetes mellitus with other circulatory complications: Secondary | ICD-10-CM | POA: Diagnosis not present

## 2022-05-02 DIAGNOSIS — M9902 Segmental and somatic dysfunction of thoracic region: Secondary | ICD-10-CM | POA: Diagnosis not present

## 2022-05-02 DIAGNOSIS — E1136 Type 2 diabetes mellitus with diabetic cataract: Secondary | ICD-10-CM | POA: Diagnosis not present

## 2022-05-03 DIAGNOSIS — M9903 Segmental and somatic dysfunction of lumbar region: Secondary | ICD-10-CM | POA: Diagnosis not present

## 2022-05-03 DIAGNOSIS — M9902 Segmental and somatic dysfunction of thoracic region: Secondary | ICD-10-CM | POA: Diagnosis not present

## 2022-05-09 DIAGNOSIS — M5442 Lumbago with sciatica, left side: Secondary | ICD-10-CM | POA: Diagnosis not present

## 2022-05-09 DIAGNOSIS — M9902 Segmental and somatic dysfunction of thoracic region: Secondary | ICD-10-CM | POA: Diagnosis not present

## 2022-05-09 DIAGNOSIS — M9903 Segmental and somatic dysfunction of lumbar region: Secondary | ICD-10-CM | POA: Diagnosis not present

## 2022-05-10 DIAGNOSIS — M9903 Segmental and somatic dysfunction of lumbar region: Secondary | ICD-10-CM | POA: Diagnosis not present

## 2022-05-10 DIAGNOSIS — M5442 Lumbago with sciatica, left side: Secondary | ICD-10-CM | POA: Diagnosis not present

## 2022-05-10 DIAGNOSIS — M9902 Segmental and somatic dysfunction of thoracic region: Secondary | ICD-10-CM | POA: Diagnosis not present

## 2022-05-11 DIAGNOSIS — M5442 Lumbago with sciatica, left side: Secondary | ICD-10-CM | POA: Diagnosis not present

## 2022-05-11 DIAGNOSIS — M9903 Segmental and somatic dysfunction of lumbar region: Secondary | ICD-10-CM | POA: Diagnosis not present

## 2022-05-11 DIAGNOSIS — M9902 Segmental and somatic dysfunction of thoracic region: Secondary | ICD-10-CM | POA: Diagnosis not present

## 2022-05-16 DIAGNOSIS — H35372 Puckering of macula, left eye: Secondary | ICD-10-CM | POA: Diagnosis not present

## 2022-05-16 DIAGNOSIS — H43811 Vitreous degeneration, right eye: Secondary | ICD-10-CM | POA: Diagnosis not present

## 2022-05-16 DIAGNOSIS — H34813 Central retinal vein occlusion, bilateral, with macular edema: Secondary | ICD-10-CM | POA: Diagnosis not present

## 2022-05-17 DIAGNOSIS — M9902 Segmental and somatic dysfunction of thoracic region: Secondary | ICD-10-CM | POA: Diagnosis not present

## 2022-05-17 DIAGNOSIS — M9903 Segmental and somatic dysfunction of lumbar region: Secondary | ICD-10-CM | POA: Diagnosis not present

## 2022-05-18 DIAGNOSIS — M9903 Segmental and somatic dysfunction of lumbar region: Secondary | ICD-10-CM | POA: Diagnosis not present

## 2022-05-18 DIAGNOSIS — M9902 Segmental and somatic dysfunction of thoracic region: Secondary | ICD-10-CM | POA: Diagnosis not present

## 2022-05-23 DIAGNOSIS — M9902 Segmental and somatic dysfunction of thoracic region: Secondary | ICD-10-CM | POA: Diagnosis not present

## 2022-05-23 DIAGNOSIS — M9903 Segmental and somatic dysfunction of lumbar region: Secondary | ICD-10-CM | POA: Diagnosis not present

## 2022-05-25 DIAGNOSIS — M9903 Segmental and somatic dysfunction of lumbar region: Secondary | ICD-10-CM | POA: Diagnosis not present

## 2022-05-25 DIAGNOSIS — M9902 Segmental and somatic dysfunction of thoracic region: Secondary | ICD-10-CM | POA: Diagnosis not present

## 2022-05-26 ENCOUNTER — Encounter: Payer: Self-pay | Admitting: Hematology and Oncology

## 2022-05-27 ENCOUNTER — Encounter: Payer: Self-pay | Admitting: Family

## 2022-05-31 DIAGNOSIS — M9902 Segmental and somatic dysfunction of thoracic region: Secondary | ICD-10-CM | POA: Diagnosis not present

## 2022-05-31 DIAGNOSIS — M9903 Segmental and somatic dysfunction of lumbar region: Secondary | ICD-10-CM | POA: Diagnosis not present

## 2022-05-31 DIAGNOSIS — M5442 Lumbago with sciatica, left side: Secondary | ICD-10-CM | POA: Diagnosis not present

## 2022-06-01 DIAGNOSIS — M9903 Segmental and somatic dysfunction of lumbar region: Secondary | ICD-10-CM | POA: Diagnosis not present

## 2022-06-01 DIAGNOSIS — M5442 Lumbago with sciatica, left side: Secondary | ICD-10-CM | POA: Diagnosis not present

## 2022-06-01 DIAGNOSIS — M9902 Segmental and somatic dysfunction of thoracic region: Secondary | ICD-10-CM | POA: Diagnosis not present

## 2022-06-04 ENCOUNTER — Other Ambulatory Visit: Payer: Self-pay | Admitting: *Deleted

## 2022-06-04 DIAGNOSIS — C7931 Secondary malignant neoplasm of brain: Secondary | ICD-10-CM

## 2022-06-06 ENCOUNTER — Ambulatory Visit (HOSPITAL_COMMUNITY)
Admission: RE | Admit: 2022-06-06 | Discharge: 2022-06-06 | Disposition: A | Discharge status: Home / Self Care | Payer: Medicare PPO | Source: Ambulatory Visit | Attending: Hematology and Oncology | Admitting: Hematology and Oncology

## 2022-06-06 ENCOUNTER — Encounter (HOSPITAL_COMMUNITY): Payer: Self-pay

## 2022-06-06 ENCOUNTER — Inpatient Hospital Stay: Payer: Medicare PPO | Attending: Internal Medicine

## 2022-06-06 DIAGNOSIS — C50919 Malignant neoplasm of unspecified site of unspecified female breast: Secondary | ICD-10-CM | POA: Insufficient documentation

## 2022-06-06 DIAGNOSIS — C3490 Malignant neoplasm of unspecified part of unspecified bronchus or lung: Secondary | ICD-10-CM | POA: Diagnosis not present

## 2022-06-06 DIAGNOSIS — J984 Other disorders of lung: Secondary | ICD-10-CM | POA: Diagnosis not present

## 2022-06-06 DIAGNOSIS — K760 Fatty (change of) liver, not elsewhere classified: Secondary | ICD-10-CM | POA: Diagnosis not present

## 2022-06-06 DIAGNOSIS — C50411 Malignant neoplasm of upper-outer quadrant of right female breast: Secondary | ICD-10-CM | POA: Insufficient documentation

## 2022-06-06 DIAGNOSIS — C7931 Secondary malignant neoplasm of brain: Secondary | ICD-10-CM | POA: Insufficient documentation

## 2022-06-06 DIAGNOSIS — Z79811 Long term (current) use of aromatase inhibitors: Secondary | ICD-10-CM | POA: Insufficient documentation

## 2022-06-06 DIAGNOSIS — Z170 Estrogen receptor positive status [ER+]: Secondary | ICD-10-CM | POA: Insufficient documentation

## 2022-06-06 LAB — CMP (CANCER CENTER ONLY)
ALT: 20 U/L (ref 0–44)
AST: 19 U/L (ref 15–41)
Albumin: 4.2 g/dL (ref 3.5–5.0)
Alkaline Phosphatase: 77 U/L (ref 38–126)
Anion gap: 9 (ref 5–15)
BUN: 17 mg/dL (ref 8–23)
CO2: 27 mmol/L (ref 22–32)
Calcium: 9.5 mg/dL (ref 8.9–10.3)
Chloride: 107 mmol/L (ref 98–111)
Creatinine: 0.77 mg/dL (ref 0.44–1.00)
GFR, Estimated: >60 mL/min (ref >60)
Glucose, Bld: 148 mg/dL — ABNORMAL HIGH (ref 70–99)
Potassium: 3.6 mmol/L (ref 3.5–5.1)
Sodium: 143 mmol/L (ref 135–145)
Total Bilirubin: 0.3 mg/dL (ref 0.3–1.2)
Total Protein: 7.7 g/dL (ref 6.5–8.1)

## 2022-06-06 LAB — CBC WITH DIFFERENTIAL (CANCER CENTER ONLY)
Abs Immature Granulocytes: 0.01 10*3/uL (ref 0.00–0.07)
Basophils Absolute: 0 10*3/uL (ref 0.0–0.1)
Basophils Relative: 1 %
Eosinophils Absolute: 0.2 10*3/uL (ref 0.0–0.5)
Eosinophils Relative: 4 %
HCT: 43.8 % (ref 36.0–46.0)
Hemoglobin: 14.7 g/dL (ref 12.0–15.0)
Immature Granulocytes: 0 %
Lymphocytes Relative: 44 %
Lymphs Abs: 2.3 10*3/uL (ref 0.7–4.0)
MCH: 29.1 pg (ref 26.0–34.0)
MCHC: 33.6 g/dL (ref 30.0–36.0)
MCV: 86.6 fL (ref 80.0–100.0)
Monocytes Absolute: 0.5 10*3/uL (ref 0.1–1.0)
Monocytes Relative: 9 %
Neutro Abs: 2.2 10*3/uL (ref 1.7–7.7)
Neutrophils Relative %: 42 %
Platelet Count: 230 10*3/uL (ref 150–400)
RBC: 5.06 MIL/uL (ref 3.87–5.11)
RDW: 13.5 % (ref 11.5–15.5)
WBC Count: 5.2 10*3/uL (ref 4.0–10.5)
nRBC: 0 % (ref 0.0–0.2)

## 2022-06-06 MED ORDER — SODIUM CHLORIDE (PF) 0.9 % IJ SOLN
INTRAMUSCULAR | Status: AC
  Filled 2022-06-06: qty 50

## 2022-06-06 MED ORDER — IOHEXOL 300 MG/ML  SOLN
100.0000 mL | Freq: Once | INTRAMUSCULAR | Status: AC | PRN
  Administered 2022-06-06: 100 mL via INTRAVENOUS

## 2022-06-08 NOTE — Progress Notes (Signed)
Patient Care Team: [name], [place] as PCP - General (Internal Medicine)  DIAGNOSIS: No diagnosis found.  SUMMARY OF ONCOLOGIC HISTORY: Oncology History  Primary cancer of upper outer quadrant of right female breast ([place])  04/16/2012 Initial Biopsy   Right breast mass biopsy invasive ductal carcinoma with abundant mucin in all the 3 masses, right axillary lymph node biopsy positive for IDC, ER 100% PR percent Ki-67 19%, HER-2 negative ratio 0.94   04/21/2012 Breast MRI   Right breast 3 masses 11o'clock position 1.8 cm, now 11:00 position 2 cm, and 10:00 position 2.3 cm with an abnormal level I right axillary lymph node 3 cm   05/20/2012 Surgery   Right breast mastectomy invasive ductal carcinoma grade 2; 1.9 cm, 2.5 cm, 1.8 cm, 1/17 LN positive, second and third massive grade 3   06/24/2012 - 10/07/2012 Chemotherapy   Adjuvant chemotherapy with Taxotere Cytoxan x6 cycles   10/16/2012 - 12/16/2012 Radiation Therapy   Adjuvant radiation therapy by Dr. [name]   01/07/2013 -  Anti-estrogen oral therapy   Tamoxifen 20 mg daily   11/19/2016 Imaging   MRI brain: 2.9 similar mass within the inferior vermis with surrounding edema and local mass effect partially effacing the fourth ventricle.    11/21/2016 Relapse/Recurrence   Brain biopsy: Metastatic ductal carcinoma the breast positive for ER PR, GATA 3, negative for PAX8 and TTF-1   11/21/2016 Surgery   Suboccipital craniotomy for gross total resection of cerebellar tumor by Dr. [name]     CHIEF COMPLIANT:   INTERVAL HISTORY: Tasha Ortiz is a   ALLERGIES:  is allergic to lisinopril.  MEDICATIONS:  Current Outpatient Medications  Medication Sig Dispense Refill   amLODipine (NORVASC) 10 MG tablet Take 1 tablet (10 mg total) by mouth daily. 90 tablet 3   anastrozole (ARIMIDEX) 1 MG tablet TAKE 1 TABLET BY MOUTH EVERY DAY 90 tablet 3   blood glucose meter kit and supplies KIT Dispense based on patient and  insurance preference. Use up to four times daily as directed. (FOR ICD-9 250.00, 250.01). 1 each 0   dorzolamide-timolol (COSOPT) 22.3-6.8 MG/ML ophthalmic solution      fluticasone (FLONASE) 50 MCG/ACT nasal spray Place 1 spray into both nostrils daily. 16 g 3   Lancets (ONETOUCH DELICA PLUS [id number]) MISC USE TO check blood sugar UP TO 4 TIMES DAILY     levothyroxine (SYNTHROID) 50 MCG tablet Take 1 tablet (50 mcg total) by mouth daily before breakfast. 90 tablet 0   metoCLOPramide (REGLAN) 5 MG tablet 1 tab twice daily before breakfast and supper. May increase to QID (before meals and at bedtime) if needed. 120 tablet 2   ONETOUCH ULTRA test strip      potassium chloride (KLOR-CON M10) 10 MEQ tablet Take 1 tablet (10 mEq total) by mouth 2 (two) times daily. 180 tablet 3   pravastatin (PRAVACHOL) 40 MG tablet Take 1 tablet (40 mg total) by mouth daily. 90 tablet 3   sertraline (ZOLOFT) 50 MG tablet Take 1 tablet daily 90 tablet 3   XARELTO 20 MG TABS tablet TAKE 1 TABLET BY MOUTH DAILY WITH SUPPER 90 tablet 1   No current facility-administered medications for this visit.    PHYSICAL EXAMINATION: ECOG PERFORMANCE STATUS: {CHL ONC ECOG PS:[id number]}  There were no vitals filed for this visit. There were no vitals filed for this visit.  BREAST:*** No palpable masses or nodules in either right or left breasts. No palpable axillary supraclavicular or infraclavicular adenopathy  no breast tenderness or nipple discharge. (exam performed in the presence of a chaperone)  LABORATORY DATA:  I have reviewed the data as listed    Latest Ref Rng & Units 06/06/2022    7:31 AM 02/16/2022   11:00 AM 05/29/2021    8:06 AM  CMP  Glucose 70 - 99 mg/dL 148  146    BUN 8 - 23 mg/dL 17  12    Creatinine 0.44 - 1.00 mg/dL 0.77  0.67  0.60   Sodium 135 - 145 mmol/L 143  142    Potassium 3.5 - 5.1 mmol/L 3.6  3.8    Chloride 98 - 111 mmol/L 107  105    CO2 22 - 32 mmol/L 27  29    Calcium 8.9 - 10.3  mg/dL 9.5  9.7    Total Protein 6.5 - 8.1 g/dL 7.7  7.8    Total Bilirubin 0.3 - 1.2 mg/dL 0.3  0.4    Alkaline Phos 38 - 126 U/L 77  80    AST 15 - 41 U/L 19  18    ALT 0 - 44 U/L 20  20      Lab Results  Component Value Date   WBC 5.2 06/06/2022   HGB 14.7 06/06/2022   HCT 43.8 06/06/2022   MCV 86.6 06/06/2022   PLT 230 06/06/2022   NEUTROABS 2.2 06/06/2022    ASSESSMENT & PLAN:  No problem-specific Assessment & Plan notes found for this encounter.    No orders of the defined types were placed in this encounter.  The patient has a good understanding of the overall plan. she agrees with it. she will call with any problems that may develop before the next visit here. Total time spent: 30 mins including face to face time and time spent for planning, charting and co-ordination of care   Suzzette Righter, [place] 06/08/22    I Gardiner Coins am acting as a Education administrator for [employer]  ***

## 2022-06-11 ENCOUNTER — Inpatient Hospital Stay (HOSPITAL_BASED_OUTPATIENT_CLINIC_OR_DEPARTMENT_OTHER): Payer: Medicare PPO | Admitting: Hematology and Oncology

## 2022-06-11 ENCOUNTER — Other Ambulatory Visit: Payer: Self-pay

## 2022-06-11 VITALS — BP 149/88 | HR 72 | Temp 97.2°F | Resp 18 | Ht 64.0 in | Wt 154.2 lb

## 2022-06-11 DIAGNOSIS — Z170 Estrogen receptor positive status [ER+]: Secondary | ICD-10-CM

## 2022-06-11 DIAGNOSIS — C7931 Secondary malignant neoplasm of brain: Secondary | ICD-10-CM | POA: Diagnosis not present

## 2022-06-11 DIAGNOSIS — C50411 Malignant neoplasm of upper-outer quadrant of right female breast: Secondary | ICD-10-CM

## 2022-06-11 DIAGNOSIS — Z79811 Long term (current) use of aromatase inhibitors: Secondary | ICD-10-CM | POA: Diagnosis not present

## 2022-06-11 NOTE — Assessment & Plan Note (Addendum)
Metastatic breast cancer with brain metastases: Cerebellar lesion 2.9 cm in size status post gross total resection and Dr. [name] on 11/21/2016   (Right breast invasive ductal carcinoma multifocal disease ER/PR positive HER-2 negative T2, N1, M0 stage IIB status post mastectomy, adjuvant chemotherapy and radiation now on antiestrogen therapy with tamoxifen since October 2014.)   Current treatment: Anastrozole Anastrozole toxicities: Denies any hot flashes or myalgias    CT CAP 05/30/21: No evidence of metastatic disease.  Hepatic steatosis. Brain MRI 03/18/2020: Stable post resection of posterior fossa tumor. CT CAP 06/06/2022: No evidence of metastatic disease, fatty liver   Balance issues and lack of strength in lower extremities:  Using a cane (she used to use a walker)   We discussed the pros and cons of continuing anticoagulation.  She has been on it for 5 and half years and since there is no visible evidence of systemic metastatic disease, we decided to discontinue Xarelto at this time.   We will continue to monitor her with scans every 12 months and follow up after.

## 2022-06-12 ENCOUNTER — Ambulatory Visit: Payer: Medicare PPO | Admitting: Family

## 2022-06-13 DIAGNOSIS — M9903 Segmental and somatic dysfunction of lumbar region: Secondary | ICD-10-CM | POA: Diagnosis not present

## 2022-06-13 DIAGNOSIS — M9902 Segmental and somatic dysfunction of thoracic region: Secondary | ICD-10-CM | POA: Diagnosis not present

## 2022-06-19 DIAGNOSIS — M9903 Segmental and somatic dysfunction of lumbar region: Secondary | ICD-10-CM | POA: Diagnosis not present

## 2022-06-19 DIAGNOSIS — M9902 Segmental and somatic dysfunction of thoracic region: Secondary | ICD-10-CM | POA: Diagnosis not present

## 2022-06-25 ENCOUNTER — Encounter: Payer: Self-pay | Admitting: Hematology and Oncology

## 2022-06-25 ENCOUNTER — Encounter: Payer: Self-pay | Admitting: Family

## 2022-06-26 DIAGNOSIS — M9903 Segmental and somatic dysfunction of lumbar region: Secondary | ICD-10-CM | POA: Diagnosis not present

## 2022-06-26 DIAGNOSIS — M9902 Segmental and somatic dysfunction of thoracic region: Secondary | ICD-10-CM | POA: Diagnosis not present

## 2022-06-28 DIAGNOSIS — M9903 Segmental and somatic dysfunction of lumbar region: Secondary | ICD-10-CM | POA: Diagnosis not present

## 2022-06-28 DIAGNOSIS — M9902 Segmental and somatic dysfunction of thoracic region: Secondary | ICD-10-CM | POA: Diagnosis not present

## 2022-07-04 DIAGNOSIS — H34813 Central retinal vein occlusion, bilateral, with macular edema: Secondary | ICD-10-CM | POA: Diagnosis not present

## 2022-07-04 DIAGNOSIS — H43811 Vitreous degeneration, right eye: Secondary | ICD-10-CM | POA: Diagnosis not present

## 2022-07-04 DIAGNOSIS — H35372 Puckering of macula, left eye: Secondary | ICD-10-CM | POA: Diagnosis not present

## 2022-07-05 ENCOUNTER — Telehealth: Payer: Self-pay | Admitting: Family

## 2022-07-05 DIAGNOSIS — M9903 Segmental and somatic dysfunction of lumbar region: Secondary | ICD-10-CM | POA: Diagnosis not present

## 2022-07-05 DIAGNOSIS — M9902 Segmental and somatic dysfunction of thoracic region: Secondary | ICD-10-CM | POA: Diagnosis not present

## 2022-07-05 DIAGNOSIS — M9905 Segmental and somatic dysfunction of pelvic region: Secondary | ICD-10-CM | POA: Diagnosis not present

## 2022-07-05 DIAGNOSIS — M5442 Lumbago with sciatica, left side: Secondary | ICD-10-CM | POA: Diagnosis not present

## 2022-07-05 NOTE — Telephone Encounter (Signed)
[place] to schedule their annual wellness visit. Appointment made for 07/13/2022.  Sherol Dade; Care Guide [facility] Direct Dial: [phone]

## 2022-07-10 DIAGNOSIS — M9903 Segmental and somatic dysfunction of lumbar region: Secondary | ICD-10-CM | POA: Diagnosis not present

## 2022-07-10 DIAGNOSIS — M9902 Segmental and somatic dysfunction of thoracic region: Secondary | ICD-10-CM | POA: Diagnosis not present

## 2022-07-13 ENCOUNTER — Ambulatory Visit (INDEPENDENT_AMBULATORY_CARE_PROVIDER_SITE_OTHER): Payer: Medicare PPO

## 2022-07-13 VITALS — Ht 64.0 in | Wt 154.0 lb

## 2022-07-13 DIAGNOSIS — D496 Neoplasm of unspecified behavior of brain: Secondary | ICD-10-CM | POA: Diagnosis not present

## 2022-07-13 DIAGNOSIS — Z139 Encounter for screening, unspecified: Secondary | ICD-10-CM | POA: Diagnosis not present

## 2022-07-13 DIAGNOSIS — Z0000 Encounter for general adult medical examination without abnormal findings: Secondary | ICD-10-CM | POA: Diagnosis not present

## 2022-07-13 DIAGNOSIS — Z1231 Encounter for screening mammogram for malignant neoplasm of breast: Secondary | ICD-10-CM

## 2022-07-13 NOTE — Patient Instructions (Signed)
Tasha Ortiz , Thank you for taking time to come for your Medicare Wellness Visit. I appreciate your ongoing commitment to your health goals. Please review the following plan we discussed and let me know if I can assist you in the future.   These are the goals we discussed:  Goals      Patient Stated     To walk without walker and better balance         This is a list of the screening recommended for you and due dates:  Health Maintenance  Topic Date Due   COVID-19 Vaccine (1) Never done   Eye exam for diabetics  Never done   Yearly kidney health urinalysis for diabetes  Never done   Hepatitis C Screening: USPSTF Recommendation to screen - Ages 52-79 yo.  Never done   DTaP/Tdap/Td vaccine (1 - Tdap) Never done   Zoster (Shingles) Vaccine (1 of 2) Never done   Hemoglobin A1C  08/17/2022   Complete foot exam   10/10/2022   Flu Shot  11/01/2022   Mammogram  03/04/2023   Yearly kidney function blood test for diabetes  06/06/2023   Medicare Annual Wellness Visit  07/13/2023   Pap Smear  01/04/2024   Colon Cancer Screening  01/17/2025   HIV Screening  Completed   HPV Vaccine  Aged Out    Advanced directives:   Discussed will obtain copy from office for completion   Conditions/risks identified: Keep up the good work  Next appointment: Follow up in one year for your annual wellness visit    Preventive Care 65 Years and Older, Female Preventive care refers to lifestyle choices and visits with your health care provider that can promote health and wellness. What does preventive care include? A yearly physical exam. This is also called an annual well check. Dental exams once or twice a year. Routine eye exams. Ask your health care provider how often you should have your eyes checked. Personal lifestyle choices, including: Daily care of your teeth and gums. Regular physical activity. Eating a healthy diet. Avoiding tobacco and drug use. Limiting alcohol use. Practicing safe  sex. Taking low-dose aspirin every day. Taking vitamin and mineral supplements as recommended by your health care provider. What happens during an annual well check? The services and screenings done by your health care provider during your annual well check will depend on your age, overall health, lifestyle risk factors, and family history of disease. Counseling  Your health care provider may ask you questions about your: Alcohol use. Tobacco use. Drug use. Emotional well-being. Home and relationship well-being. Sexual activity. Eating habits. History of falls. Memory and ability to understand (cognition). Work and work Astronomer. Reproductive health. Screening  You may have the following tests or measurements: Height, weight, and BMI. Blood pressure. Lipid and cholesterol levels. These may be checked every 5 years, or more frequently if you are over 43 years old. Skin check. Lung cancer screening. You may have this screening every year starting at age 41 if you have a 30-pack-year history of smoking and currently smoke or have quit within the past 15 years. Fecal occult blood test (FOBT) of the stool. You may have this test every year starting at age 64. Flexible sigmoidoscopy or colonoscopy. You may have a sigmoidoscopy every 5 years or a colonoscopy every 10 years starting at age 54. Hepatitis C blood test. Hepatitis B blood test. Sexually transmitted disease (STD) testing. Diabetes screening. This is done by checking your blood sugar (  glucose) after you have not eaten for a while (fasting). You may have this done every 1-3 years. Bone density scan. This is done to screen for osteoporosis. You may have this done starting at age 73. Mammogram. This may be done every 1-2 years. Talk to your health care provider about how often you should have regular mammograms. Talk with your health care provider about your test results, treatment options, and if necessary, the need for more  tests. Vaccines  Your health care provider may recommend certain vaccines, such as: Influenza vaccine. This is recommended every year. Tetanus, diphtheria, and acellular pertussis (Tdap, Td) vaccine. You may need a Td booster every 10 years. Zoster vaccine. You may need this after age 32. Pneumococcal 13-valent conjugate (PCV13) vaccine. One dose is recommended after age 57. Pneumococcal polysaccharide (PPSV23) vaccine. One dose is recommended after age 50. Talk to your health care provider about which screenings and vaccines you need and how often you need them. This information is not intended to replace advice given to you by your health care provider. Make sure you discuss any questions you have with your health care provider. Document Released: 04/15/2015 Document Revised: 12/07/2015 Document Reviewed: 01/18/2015 Elsevier Interactive Patient Education  2017 [place] Prevention in the Home Falls can cause injuries. They can happen to people of all ages. There are many things you can do to make your home safe and to help prevent falls. What can I do on the outside of my home? Regularly fix the edges of walkways and driveways and fix any cracks. Remove anything that might make you trip as you walk through a door, such as a raised step or threshold. Trim any bushes or trees on the path to your home. Use bright outdoor lighting. Clear any walking paths of anything that might make someone trip, such as rocks or tools. Regularly check to see if handrails are loose or broken. Make sure that both sides of any steps have handrails. Any raised decks and porches should have guardrails on the edges. Have any leaves, snow, or ice cleared regularly. Use sand or salt on walking paths during winter. Clean up any spills in your garage right away. This includes oil or grease spills. What can I do in the bathroom? Use night lights. Install grab bars by the toilet and in the tub and shower.  Do not use towel bars as grab bars. Use non-skid mats or decals in the tub or shower. If you need to sit down in the shower, use a plastic, non-slip stool. Keep the floor dry. Clean up any water that spills on the floor as soon as it happens. Remove soap buildup in the tub or shower regularly. Attach bath mats securely with double-sided non-slip rug tape. Do not have throw rugs and other things on the floor that can make you trip. What can I do in the bedroom? Use night lights. Make sure that you have a light by your bed that is easy to reach. Do not use any sheets or blankets that are too big for your bed. They should not hang down onto the floor. Have a firm chair that has side arms. You can use this for support while you get dressed. Do not have throw rugs and other things on the floor that can make you trip. What can I do in the kitchen? Clean up any spills right away. Avoid walking on wet floors. Keep items that you use a lot in easy-to-reach places.  If you need to reach something above you, use a strong step stool that has a grab bar. Keep electrical cords out of the way. Do not use floor polish or wax that makes floors slippery. If you must use wax, use non-skid floor wax. Do not have throw rugs and other things on the floor that can make you trip. What can I do with my stairs? Do not leave any items on the stairs. Make sure that there are handrails on both sides of the stairs and use them. Fix handrails that are broken or loose. Make sure that handrails are as long as the stairways. Check any carpeting to make sure that it is firmly attached to the stairs. Fix any carpet that is loose or worn. Avoid having throw rugs at the top or bottom of the stairs. If you do have throw rugs, attach them to the floor with carpet tape. Make sure that you have a light switch at the top of the stairs and the bottom of the stairs. If you do not have them, ask someone to add them for you. What else  can I do to help prevent falls? Wear shoes that: Do not have high heels. Have rubber bottoms. Are comfortable and fit you well. Are closed at the toe. Do not wear sandals. If you use a stepladder: Make sure that it is fully opened. Do not climb a closed stepladder. Make sure that both sides of the stepladder are locked into place. Ask someone to hold it for you, if possible. Clearly mark and make sure that you can see: Any grab bars or handrails. First and last steps. Where the edge of each step is. Use tools that help you move around (mobility aids) if they are needed. These include: Canes. Walkers. Scooters. Crutches. Turn on the lights when you go into a dark area. Replace any light bulbs as soon as they burn out. Set up your furniture so you have a clear path. Avoid moving your furniture around. If any of your floors are uneven, fix them. If there are any pets around you, be aware of where they are. Review your medicines with your doctor. Some medicines can make you feel dizzy. This can increase your chance of falling. Ask your doctor what other things that you can do to help prevent falls. This information is not intended to replace advice given to you by your health care provider. Make sure you discuss any questions you have with your health care provider. Document Released: 01/13/2009 Document Revised: 08/25/2015 Document Reviewed: 04/23/2014 Elsevier Interactive Patient Education  2017 [employer].

## 2022-07-13 NOTE — Progress Notes (Signed)
Subjective:   Tasha Ortiz is a 67 y.o. female who presents for Medicare Annual (Subsequent) preventive examination.  I connected with  [name] on 07/13/22 by a audio enabled telemedicine application and verified that I am speaking with the correct person using two identifiers.  Patient Location: Home  Provider Location: Home Office  I discussed the limitations of evaluation and management by telemedicine. The patient expressed understanding and agreed to proceed.    Review of Systems           Objective:    There were no vitals filed for this visit. There is no height or weight on file to calculate BMI.     06/11/2022    8:28 AM 03/19/2022    9:10 AM 07/13/2021   11:08 AM 07/11/2021    2:14 PM 12/01/2019   12:21 PM 07/15/2019    9:38 AM 05/13/2017    9:20 AM  Advanced Directives  Does Patient Have a Medical Advance Directive? No No No Yes No No No  Type of Advance Directive    Healthcare Power of Attorney     Does patient want to make changes to medical advance directive?   Yes (MAU/Ambulatory/Procedural Areas - Information given)      Copy of Healthcare Power of Attorney in Chart?    No - copy requested     Would patient like information on creating a medical advance directive? No - Patient declined No - Patient declined   Yes (ED - Information included in AVS) No - Patient declined     Current Medications (verified) Outpatient Encounter Medications as of 07/13/2022  Medication Sig   amLODipine (NORVASC) 10 MG tablet Take 1 tablet (10 mg total) by mouth daily.   anastrozole (ARIMIDEX) 1 MG tablet TAKE 1 TABLET BY MOUTH EVERY DAY   blood glucose meter kit and supplies KIT Dispense based on patient and insurance preference. Use up to four times daily as directed. (FOR ICD-9 250.00, 250.01).   dorzolamide-timolol (COSOPT) 22.3-6.8 MG/ML ophthalmic solution    fluticasone (FLONASE) 50 MCG/ACT nasal spray Place 1 spray into both nostrils daily.   Lancets  (ONETOUCH DELICA PLUS LANCET33G) MISC USE TO check blood sugar UP TO 4 TIMES DAILY   levothyroxine (SYNTHROID) 50 MCG tablet Take 1 tablet (50 mcg total) by mouth daily before breakfast.   metoCLOPramide (REGLAN) 5 MG tablet 1 tab twice daily before breakfast and supper. May increase to QID (before meals and at bedtime) if needed.   ONETOUCH ULTRA test strip    potassium chloride (KLOR-CON M10) 10 MEQ tablet Take 1 tablet (10 mEq total) by mouth 2 (two) times daily.   pravastatin (PRAVACHOL) 40 MG tablet Take 1 tablet (40 mg total) by mouth daily.   sertraline (ZOLOFT) 50 MG tablet Take 1 tablet daily   [DISCONTINUED] Prochlorperazine Maleate (COMPAZINE PO) Take by mouth.   No facility-administered encounter medications on file as of 07/13/2022.    Allergies (verified) Lisinopril   History: Past Medical History:  Diagnosis Date   Acid reflux    Allergy    seasonal   Breast cancer (HCC)    right,no iv or blood on right sid, chemo and radiation 2014   Family history of brain cancer    Family history of breast cancer    Heartburn    Hypercholesteremia    taken off chol meds Dec 2012   Hypertension    Hypertension    Personal history of chemotherapy 05/2012   rt  breast   Personal history of radiation therapy 10/2012   rt breast   Past Surgical History:  Procedure Laterality Date   BREAST BIOPSY Right 04/16/2012   BREAST SURGERY Right 05/20/12   Rt br mastectomy   CESAREAN SECTION  25 years ago   colonoscopy     COLONOSCOPY WITH PROPOFOL N/A 01/18/2015   Procedure: COLONOSCOPY WITH PROPOFOL;  Surgeon: Napoleon Form, MD;  Location: [facility];  Service: Endoscopy;  Laterality: N/A;   MASTECTOMY Right 05/2012   MODIFIED MASTECTOMY Right 05/20/2012   Procedure: MODIFIED MASTECTOMY;  Surgeon: Mariella Saa, MD;  Location: [facility] OR;  Service: General;  Laterality: Right;   PORTACATH PLACEMENT Left 05/20/2012   Procedure: INSERTION PORT-A-CATH;  Surgeon: Mariella Saa, MD;  Location: [facility] OR;  Service: General;  Laterality: Left;   SUBOCCIPITAL CRANIECTOMY CERVICAL LAMINECTOMY N/A 11/21/2016   Procedure: SUBOCCIPITAL CRANIECTOMY RESECTION TUMOR;  Surgeon: Tressie Stalker, MD;  Location: [facility];  Service: Neurosurgery;  Laterality: N/A;   TUBAL LIGATION     Family History  Problem Relation Age of Onset   Hypertension Mother    Aneurysm Mother 14   Brain cancer Sister 81   Breast cancer Cousin 40   Jaundice Father 52   Diabetes Daughter    Breast cancer Cousin 71   Breast cancer Cousin 34   Colon cancer Neg Hx    Social History   Socioeconomic History   Marital status: Divorced    Spouse name: Not on file   Number of children: 2   Years of education: 16   Highest education level: Not on file  Occupational History   Occupation: Health visitor  Tobacco Use   Smoking status: Never   Smokeless tobacco: Former    Types: Chew    Quit date: 11/18/1996  Substance and Sexual Activity   Alcohol use: No    Alcohol/week: 0.0 standard drinks of alcohol   Drug use: No   Sexual activity: Never    Comment: menarche 14, P2, no HRT  Other Topics Concern   Not on file  Social History Narrative   Fun: [employer]   Denies religious beliefs effecting health care.    Social Determinants of Health   Financial Resource Strain: Low Risk  (07/11/2021)   Overall Financial Resource Strain (CARDIA)    Difficulty of Paying Living Expenses: Not hard at all  Food Insecurity: No Food Insecurity (12/26/2021)   Hunger Vital Sign    Worried About Running Out of Food in the Last Year: Never true    Ran Out of Food in the Last Year: Never true  Transportation Needs: No Transportation Needs (12/26/2021)   PRAPARE - Administrator, Civil Service (Medical): No    Lack of Transportation (Non-Medical): No  Physical Activity: Inactive (07/11/2021)   Exercise Vital Sign    Days of Exercise per Week: 0 days    Minutes of Exercise per Session: 0 min  Stress:  No Stress Concern Present (07/11/2021)   [employer] of Occupational Health - Occupational Stress Questionnaire    Feeling of Stress : Not at all  Social Connections: Moderately Isolated (07/11/2021)   Social Connection and Isolation Panel [NHANES]    Frequency of Communication with Friends and Family: More than three times a week    Frequency of Social Gatherings with Friends and Family: More than three times a week    Attends Religious Services: More than 4 times per year    Active Member  of Clubs or Organizations: No    Attends Banker Meetings: Never    Marital Status: Divorced    Tobacco Counseling Counseling given: Not Answered   Clinical Intake:              How often do you need to have someone help you when you read instructions, pamphlets, or other written materials from your doctor or pharmacy?: (P) 3 - Sometimes  Diabetic? Yes         Activities of Daily Living    07/06/2022   12:14 PM  In your present state of health, do you have any difficulty performing the following activities:  Hearing? 0  Vision? 0  Difficulty concentrating or making decisions? 0  Walking or climbing stairs? 1  Dressing or bathing? 0  Doing errands, shopping? 0  Preparing Food and eating ? N  Using the Toilet? N  In the past six months, have you accidently leaked urine? N  Do you have problems with loss of bowel control? N  Managing your Medications? N  Managing your Finances? N  Housekeeping or managing your Housekeeping? N    Patient Care Team: Olive Bass, FNP as PCP - General (Internal Medicine)  Indicate any recent Medical Services you may have received from other than [facility] providers in the past year (date may be approximate).     Assessment:   This is a routine wellness examination for [place].  Hearing/Vision screen No results found.  Dietary issues and exercise activities discussed:     Goals Addressed   None    Depression  Screen    02/16/2022   10:26 AM 07/11/2021    2:13 PM 01/03/2021   10:41 AM 11/20/2019    3:10 PM 04/17/2017   12:36 PM 02/18/2017    2:34 PM 07/30/2014   11:25 AM  PHQ 2/9 Scores  PHQ - 2 Score 0 0 0 5 0 0 0  PHQ- 9 Score    16       Fall Risk    07/06/2022   12:14 PM 02/16/2022   10:26 AM 07/11/2021    2:15 PM 01/03/2021   10:41 AM 04/17/2017   12:36 PM  Fall Risk   Falls in the past year? 0 0 0 0 No  Number falls in past yr:  0 0 0   Injury with Fall? 0 0 0 0   Risk for fall due to :   Impaired balance/gait;Impaired vision No Fall Risks   Follow up  Falls evaluation completed Falls prevention discussed Falls evaluation completed     FALL RISK PREVENTION PERTAINING TO THE HOME:  Any stairs in or around the home? Yes  If so, are there any without handrails? No  Home free of loose throw rugs in walkways, pet beds, electrical cords, etc? Yes  Adequate lighting in your home to reduce risk of falls? Yes   ASSISTIVE DEVICES UTILIZED TO PREVENT FALLS:  Life alert? No  Use of a cane, walker or w/c? Yes  Grab bars in the bathroom? No  Shower chair or bench in shower? Yes  Elevated toilet seat or a handicapped toilet? Yes   TIMED UP AND GO:  Was the test performed? No . Televisit   Cognitive Function:        07/11/2021    2:16 PM  6CIT Screen  What Year? 0 points  What month? 0 points  What time? 0 points  Count back from 20 0 points  Months in reverse 0 points  Repeat phrase 0 points  Total Score 0 points    Immunizations Immunization History  Administered Date(s) Administered   Fluad Quad(high Dose 65+) 01/03/2021   Influenza Whole 03/02/2009   Influenza,inj,Quad PF,6+ Mos 02/23/2015, 01/31/2016, 01/14/2018, 12/21/2019   PNEUMOCOCCAL CONJUGATE-20 01/03/2021    TDAP status: Due, Education has been provided regarding the importance of this vaccine. Advised may receive this vaccine at local pharmacy or Health Dept. Aware to provide a copy of the vaccination  record if obtained from local pharmacy or Health Dept. Verbalized acceptance and understanding.  Flu Vaccine status: Up to date  Pneumococcal vaccine status: Due, Education has been provided regarding the importance of this vaccine. Advised may receive this vaccine at local pharmacy or Health Dept. Aware to provide a copy of the vaccination record if obtained from local pharmacy or Health Dept. Verbalized acceptance and understanding.  Covid-19 vaccine status: Completed vaccines--Will call with dates  Qualifies for Shingles Vaccine? Yes   Zostavax completed No   Shingrix Completed?: No.    Education has been provided regarding the importance of this vaccine. Patient has been advised to call insurance company to determine out of pocket expense if they have not yet received this vaccine. Advised may also receive vaccine at local pharmacy or Health Dept. Verbalized acceptance and understanding.  Screening Tests Health Maintenance  Topic Date Due   COVID-19 Vaccine (1) Never done   OPHTHALMOLOGY EXAM  Never done   Diabetic kidney evaluation - Urine ACR  Never done   Hepatitis C Screening  Never done   DTaP/Tdap/Td (1 - Tdap) Never done   Zoster Vaccines- Shingrix (1 of 2) Never done   Medicare Annual Wellness (AWV)  07/12/2022   HEMOGLOBIN A1C  08/17/2022   FOOT EXAM  10/10/2022   INFLUENZA VACCINE  11/01/2022   MAMMOGRAM  03/04/2023   Diabetic kidney evaluation - eGFR measurement  06/06/2023   PAP SMEAR-Modifier  01/04/2024   COLONOSCOPY (Pts 45-39yrs Insurance coverage will need to be confirmed)  01/17/2025   HIV Screening  Completed   HPV VACCINES  Aged Out    Health Maintenance  Health Maintenance Due  Topic Date Due   COVID-19 Vaccine (1) Never done   OPHTHALMOLOGY EXAM  Never done   Diabetic kidney evaluation - Urine ACR  Never done   Hepatitis C Screening  Never done   DTaP/Tdap/Td (1 - Tdap) Never done   Zoster Vaccines- Shingrix (1 of 2) Never done   Medicare Annual  Wellness (AWV)  07/12/2022    Colorectal cancer screening: Type of screening: Colonoscopy. Completed 01/18/2015. Repeat every 10 years  Mammogram status: Ordered 07/13/22. Pt provided with contact info and advised to call to schedule appt.   Bone Density status: Ordered  . Pt provided with contact info and advised to call to schedule appt.  Lung Cancer Screening: (Low Dose CT Chest recommended if Age 87-80 years, 30 pack-year currently smoking OR have quit w/in 15years.) does not qualify.   Lung Cancer Screening Referral: N/A   Additional Screening:  Hepatitis C Screening: does qualify; Completed Not completed  Vision Screening: Recommended annual ophthalmology exams for early detection of glaucoma and other disorders of the eye. Is the patient up to date with their annual eye exam?  Yes  Who is the provider or what is the name of the office in which the patient attends annual eye exams? Dr. Allyne Gee [facility]  If pt is not established with a provider,  would they like to be referred to a provider to establish care? No .   Dental Screening: Recommended annual dental exams for proper oral hygiene  Community Resource Referral / Chronic Care Management: CRR required this visit?  No   CCM required this visit?  No      Plan:     I have personally reviewed and noted the following in the patient's chart:   Medical and social history Use of alcohol, tobacco or illicit drugs  Current medications and supplements including opioid prescriptions. Patient is not currently taking opioid prescriptions. Functional ability and status Nutritional status Physical activity Advanced directives List of other physicians Hospitalizations, surgeries, and ER visits in previous 12 months Vitals Screenings to include cognitive, depression, and falls Referrals and appointments  In addition, I have reviewed and discussed with patient certain preventive protocols, quality metrics, and best  practice recommendations. A written personalized care plan for preventive services as well as general preventive health recommendations were provided to patient.     Milus Mallick, CMA   07/13/2022   Nurse Notes: Discussed updated vaccines, will obtain COVID vaccine dated will give information to office.  Screening mammogram order placed, also bone density order placed

## 2022-07-14 ENCOUNTER — Encounter: Payer: Self-pay | Admitting: Hematology and Oncology

## 2022-07-20 ENCOUNTER — Encounter: Payer: Self-pay | Admitting: Hematology and Oncology

## 2022-08-08 ENCOUNTER — Ambulatory Visit (HOSPITAL_BASED_OUTPATIENT_CLINIC_OR_DEPARTMENT_OTHER)
Admission: RE | Admit: 2022-08-08 | Discharge: 2022-08-08 | Disposition: A | Discharge status: Home / Self Care | Payer: Medicare PPO | Source: Ambulatory Visit | Attending: Family | Admitting: Family

## 2022-08-08 ENCOUNTER — Encounter (HOSPITAL_BASED_OUTPATIENT_CLINIC_OR_DEPARTMENT_OTHER): Payer: Self-pay

## 2022-08-08 ENCOUNTER — Other Ambulatory Visit: Payer: Self-pay | Admitting: Family

## 2022-08-08 DIAGNOSIS — Z1231 Encounter for screening mammogram for malignant neoplasm of breast: Secondary | ICD-10-CM | POA: Insufficient documentation

## 2022-08-08 DIAGNOSIS — Z139 Encounter for screening, unspecified: Secondary | ICD-10-CM

## 2022-08-08 DIAGNOSIS — M810 Age-related osteoporosis without current pathological fracture: Secondary | ICD-10-CM | POA: Diagnosis not present

## 2022-08-08 DIAGNOSIS — D496 Neoplasm of unspecified behavior of brain: Secondary | ICD-10-CM

## 2022-08-08 DIAGNOSIS — Z13820 Encounter for screening for osteoporosis: Secondary | ICD-10-CM | POA: Insufficient documentation

## 2022-08-08 DIAGNOSIS — Z0000 Encounter for general adult medical examination without abnormal findings: Secondary | ICD-10-CM

## 2022-08-09 ENCOUNTER — Other Ambulatory Visit: Payer: Self-pay | Admitting: Family

## 2022-08-09 MED ORDER — ALENDRONATE SODIUM 70 MG PO TABS: 70.0000 mg | ORAL_TABLET | ORAL | 11 refills | Status: DC

## 2022-08-14 ENCOUNTER — Other Ambulatory Visit: Payer: Self-pay | Admitting: Hematology and Oncology

## 2022-08-14 ENCOUNTER — Other Ambulatory Visit: Payer: Self-pay | Admitting: Family

## 2022-08-21 ENCOUNTER — Encounter: Payer: Self-pay | Admitting: Family

## 2022-08-22 DIAGNOSIS — R5383 Other fatigue: Secondary | ICD-10-CM | POA: Diagnosis not present

## 2022-08-22 DIAGNOSIS — Z20822 Contact with and (suspected) exposure to covid-19: Secondary | ICD-10-CM | POA: Diagnosis not present

## 2022-08-22 DIAGNOSIS — B349 Viral infection, unspecified: Secondary | ICD-10-CM | POA: Diagnosis not present

## 2022-08-22 DIAGNOSIS — M791 Myalgia, unspecified site: Secondary | ICD-10-CM | POA: Diagnosis not present

## 2022-08-25 ENCOUNTER — Encounter: Payer: Self-pay | Admitting: Hematology and Oncology

## 2022-09-13 ENCOUNTER — Encounter: Payer: Self-pay | Admitting: Hematology and Oncology

## 2022-09-18 DIAGNOSIS — H3582 Retinal ischemia: Secondary | ICD-10-CM | POA: Diagnosis not present

## 2022-09-18 DIAGNOSIS — H43811 Vitreous degeneration, right eye: Secondary | ICD-10-CM | POA: Diagnosis not present

## 2022-09-18 DIAGNOSIS — H35372 Puckering of macula, left eye: Secondary | ICD-10-CM | POA: Diagnosis not present

## 2022-09-18 DIAGNOSIS — H34813 Central retinal vein occlusion, bilateral, with macular edema: Secondary | ICD-10-CM | POA: Diagnosis not present

## 2022-10-20 DIAGNOSIS — N3000 Acute cystitis without hematuria: Secondary | ICD-10-CM | POA: Diagnosis not present

## 2022-10-20 DIAGNOSIS — H6123 Impacted cerumen, bilateral: Secondary | ICD-10-CM | POA: Diagnosis not present

## 2022-10-20 DIAGNOSIS — R350 Frequency of micturition: Secondary | ICD-10-CM | POA: Diagnosis not present

## 2022-10-20 DIAGNOSIS — K5904 Chronic idiopathic constipation: Secondary | ICD-10-CM | POA: Diagnosis not present

## 2022-10-31 DIAGNOSIS — E1165 Type 2 diabetes mellitus with hyperglycemia: Secondary | ICD-10-CM | POA: Diagnosis not present

## 2022-10-31 DIAGNOSIS — Z7984 Long term (current) use of oral hypoglycemic drugs: Secondary | ICD-10-CM | POA: Diagnosis not present

## 2022-11-06 DIAGNOSIS — H43811 Vitreous degeneration, right eye: Secondary | ICD-10-CM | POA: Diagnosis not present

## 2022-11-06 DIAGNOSIS — H3582 Retinal ischemia: Secondary | ICD-10-CM | POA: Diagnosis not present

## 2022-11-06 DIAGNOSIS — H34813 Central retinal vein occlusion, bilateral, with macular edema: Secondary | ICD-10-CM | POA: Diagnosis not present

## 2022-11-06 DIAGNOSIS — H35372 Puckering of macula, left eye: Secondary | ICD-10-CM | POA: Diagnosis not present

## 2022-11-08 ENCOUNTER — Other Ambulatory Visit: Payer: Self-pay | Admitting: Family

## 2022-11-15 ENCOUNTER — Telehealth: Payer: Self-pay | Admitting: Family

## 2022-11-15 NOTE — Telephone Encounter (Signed)
Pt called & stated Vernona Rieger increased the dosage for some of her supplements but pt is unsure which supplements was increased. Please call & advise pt

## 2022-11-18 ENCOUNTER — Other Ambulatory Visit: Payer: Self-pay | Admitting: Family

## 2022-11-19 NOTE — Telephone Encounter (Signed)
Pt wanted to know what supplements was recommended after she took her bone density.  It was vit d3 2000 units and 1200 mg of calcium.

## 2022-12-15 ENCOUNTER — Encounter: Payer: Self-pay | Admitting: Family

## 2022-12-17 ENCOUNTER — Other Ambulatory Visit: Payer: Self-pay | Admitting: Family

## 2022-12-25 DIAGNOSIS — H34813 Central retinal vein occlusion, bilateral, with macular edema: Secondary | ICD-10-CM | POA: Diagnosis not present

## 2022-12-25 DIAGNOSIS — H43813 Vitreous degeneration, bilateral: Secondary | ICD-10-CM | POA: Diagnosis not present

## 2022-12-25 DIAGNOSIS — H3582 Retinal ischemia: Secondary | ICD-10-CM | POA: Diagnosis not present

## 2022-12-25 DIAGNOSIS — H35372 Puckering of macula, left eye: Secondary | ICD-10-CM | POA: Diagnosis not present

## 2022-12-27 ENCOUNTER — Encounter: Payer: Self-pay | Admitting: Family

## 2022-12-27 ENCOUNTER — Ambulatory Visit: Payer: Medicare PPO | Admitting: Family

## 2022-12-27 VITALS — BP 134/88 | HR 60 | Resp 18 | Ht 64.0 in | Wt 153.8 lb

## 2022-12-27 DIAGNOSIS — R300 Dysuria: Secondary | ICD-10-CM

## 2022-12-27 LAB — POCT URINALYSIS DIPSTICK
Bilirubin, UA: NEGATIVE
Blood, UA: NEGATIVE
Glucose, UA: POSITIVE — AB
Ketones, UA: NEGATIVE
Nitrite, UA: NEGATIVE
Protein, UA: NEGATIVE
Spec Grav, UA: <=1.005 — AB (ref 1.010–1.025)
Urobilinogen, UA: 0.2 E.U./dL
pH, UA: 7 (ref 5.0–8.0)

## 2022-12-27 MED ORDER — SULFAMETHOXAZOLE-TRIMETHOPRIM 800-160 MG PO TABS: 1.0000 | ORAL_TABLET | Freq: Two times a day (BID) | ORAL | 0 refills | Status: DC
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
## 2022-12-29 LAB — URINE CULTURE
MICRO NUMBER:: 15520240
SPECIMEN QUALITY:: ADEQUATE

## 2023-01-01 ENCOUNTER — Other Ambulatory Visit: Payer: Self-pay | Admitting: Family

## 2023-01-01 MED ORDER — PENICILLIN V POTASSIUM 500 MG PO TABS: 500.0000 mg | ORAL_TABLET | Freq: Three times a day (TID) | ORAL | 0 refills | Status: DC

## 2023-01-02 ENCOUNTER — Encounter: Payer: Self-pay | Admitting: Family

## 2023-01-04 ENCOUNTER — Ambulatory Visit: Payer: Medicare PPO | Admitting: Family

## 2023-01-04 ENCOUNTER — Encounter: Payer: Self-pay | Admitting: Family

## 2023-01-04 ENCOUNTER — Other Ambulatory Visit: Payer: Self-pay | Admitting: Family

## 2023-01-04 MED ORDER — PENICILLIN V POTASSIUM 500 MG PO TABS: 500.0000 mg | ORAL_TABLET | Freq: Three times a day (TID) | ORAL | 0 refills | Status: AC

## 2023-01-04 MED ORDER — SERTRALINE HCL 50 MG PO TABS: ORAL_TABLET | ORAL | 3 refills | Status: DC

## 2023-01-04 NOTE — Telephone Encounter (Signed)
We have re-sent this to a different pharmacy as asked;

## 2023-01-14 ENCOUNTER — Encounter: Payer: Self-pay | Admitting: Family

## 2023-01-23 ENCOUNTER — Encounter: Payer: Self-pay | Admitting: Family

## 2023-01-29 DIAGNOSIS — H35372 Puckering of macula, left eye: Secondary | ICD-10-CM | POA: Diagnosis not present

## 2023-01-29 DIAGNOSIS — H40043 Steroid responder, bilateral: Secondary | ICD-10-CM | POA: Diagnosis not present

## 2023-01-29 DIAGNOSIS — H43813 Vitreous degeneration, bilateral: Secondary | ICD-10-CM | POA: Diagnosis not present

## 2023-01-29 DIAGNOSIS — H3582 Retinal ischemia: Secondary | ICD-10-CM | POA: Diagnosis not present

## 2023-01-29 DIAGNOSIS — H34813 Central retinal vein occlusion, bilateral, with macular edema: Secondary | ICD-10-CM | POA: Diagnosis not present

## 2023-02-02 ENCOUNTER — Other Ambulatory Visit: Payer: Self-pay | Admitting: Family

## 2023-02-13 ENCOUNTER — Other Ambulatory Visit: Payer: Self-pay | Admitting: Family

## 2023-02-13 ENCOUNTER — Encounter: Payer: Self-pay | Admitting: Family

## 2023-02-13 DIAGNOSIS — K5900 Constipation, unspecified: Secondary | ICD-10-CM

## 2023-02-13 DIAGNOSIS — K6289 Other specified diseases of anus and rectum: Secondary | ICD-10-CM

## 2023-02-15 ENCOUNTER — Encounter: Payer: Self-pay | Admitting: Physician Assistant

## 2023-02-15 DIAGNOSIS — N3000 Acute cystitis without hematuria: Secondary | ICD-10-CM | POA: Diagnosis not present

## 2023-02-15 DIAGNOSIS — B9789 Other viral agents as the cause of diseases classified elsewhere: Secondary | ICD-10-CM | POA: Diagnosis not present

## 2023-02-15 DIAGNOSIS — R1030 Lower abdominal pain, unspecified: Secondary | ICD-10-CM | POA: Diagnosis not present

## 2023-03-08 DIAGNOSIS — H40043 Steroid responder, bilateral: Secondary | ICD-10-CM | POA: Diagnosis not present

## 2023-03-08 DIAGNOSIS — H3582 Retinal ischemia: Secondary | ICD-10-CM | POA: Diagnosis not present

## 2023-03-08 DIAGNOSIS — H35372 Puckering of macula, left eye: Secondary | ICD-10-CM | POA: Diagnosis not present

## 2023-03-08 DIAGNOSIS — H34813 Central retinal vein occlusion, bilateral, with macular edema: Secondary | ICD-10-CM | POA: Diagnosis not present

## 2023-03-08 DIAGNOSIS — H43813 Vitreous degeneration, bilateral: Secondary | ICD-10-CM | POA: Diagnosis not present

## 2023-03-14 ENCOUNTER — Ambulatory Visit
Admission: RE | Admit: 2023-03-14 | Discharge: 2023-03-14 | Disposition: A | Discharge status: Home / Self Care | Payer: Medicare PPO | Source: Ambulatory Visit | Attending: Internal Medicine | Admitting: Internal Medicine

## 2023-03-14 DIAGNOSIS — C7931 Secondary malignant neoplasm of brain: Secondary | ICD-10-CM

## 2023-03-14 DIAGNOSIS — G9389 Other specified disorders of brain: Secondary | ICD-10-CM | POA: Diagnosis not present

## 2023-03-14 DIAGNOSIS — Z9889 Other specified postprocedural states: Secondary | ICD-10-CM | POA: Diagnosis not present

## 2023-03-14 MED ORDER — GADOPICLENOL 0.5 MMOL/ML IV SOLN
7.0000 mL | Freq: Once | INTRAVENOUS | Status: AC | PRN
  Administered 2023-03-14: 7 mL via INTRAVENOUS

## 2023-03-21 ENCOUNTER — Inpatient Hospital Stay: Payer: Medicare PPO | Attending: Internal Medicine | Admitting: Internal Medicine

## 2023-03-21 VITALS — BP 152/78 | HR 62 | Temp 97.0°F | Resp 18 | Wt 156.7 lb

## 2023-03-21 DIAGNOSIS — C50411 Malignant neoplasm of upper-outer quadrant of right female breast: Secondary | ICD-10-CM | POA: Insufficient documentation

## 2023-03-21 DIAGNOSIS — Z9221 Personal history of antineoplastic chemotherapy: Secondary | ICD-10-CM | POA: Diagnosis not present

## 2023-03-21 DIAGNOSIS — Z9011 Acquired absence of right breast and nipple: Secondary | ICD-10-CM | POA: Insufficient documentation

## 2023-03-21 DIAGNOSIS — C7931 Secondary malignant neoplasm of brain: Secondary | ICD-10-CM | POA: Insufficient documentation

## 2023-03-21 DIAGNOSIS — Z803 Family history of malignant neoplasm of breast: Secondary | ICD-10-CM | POA: Insufficient documentation

## 2023-03-21 DIAGNOSIS — Z923 Personal history of irradiation: Secondary | ICD-10-CM | POA: Diagnosis not present

## 2023-03-21 DIAGNOSIS — Z170 Estrogen receptor positive status [ER+]: Secondary | ICD-10-CM | POA: Insufficient documentation

## 2023-03-21 DIAGNOSIS — Z1721 Progesterone receptor positive status: Secondary | ICD-10-CM | POA: Diagnosis not present

## 2023-03-21 DIAGNOSIS — Z1732 Human epidermal growth factor receptor 2 negative status: Secondary | ICD-10-CM | POA: Diagnosis not present

## 2023-03-21 DIAGNOSIS — Z808 Family history of malignant neoplasm of other organs or systems: Secondary | ICD-10-CM | POA: Insufficient documentation

## 2023-03-21 DIAGNOSIS — Z87891 Personal history of nicotine dependence: Secondary | ICD-10-CM | POA: Diagnosis not present

## 2023-03-21 NOTE — Progress Notes (Signed)
[facility] [address]  [place], [place] [address] [phone]   Interval Evaluation  Date of Service: 03/21/23 Patient Name: Tasha Ortiz Patient MRN: [id number] Patient DOB: 22-Aug-1955 Provider: Henreitta Leber, MD  Identifying Statement:  Tasha Ortiz is a 67 y.o. female with Malignant neoplasm metastatic to brain [facility]) [C79.31]   Primary Cancer: Breast ER+/PR+/HER-  Oncologic History: Oncology History  Primary cancer of upper outer quadrant of right female breast (HCC)  04/16/2012 Initial Biopsy   Right breast mass biopsy invasive ductal carcinoma with abundant mucin in all the 3 masses, right axillary lymph node biopsy positive for IDC, ER 100% PR percent Ki-67 19%, HER-2 negative ratio 0.94   04/21/2012 Breast MRI   Right breast 3 masses 11o'clock position 1.8 cm, now 11:00 position 2 cm, and 10:00 position 2.3 cm with an abnormal level I right axillary lymph node 3 cm   05/20/2012 Surgery   Right breast mastectomy invasive ductal carcinoma grade 2; 1.9 cm, 2.5 cm, 1.8 cm, 1/17 LN positive, second and third massive grade 3   06/24/2012 - 10/07/2012 Chemotherapy   Adjuvant chemotherapy with Taxotere Cytoxan x6 cycles   10/16/2012 - 12/16/2012 Radiation Therapy   Adjuvant radiation therapy by Dr. Mitzi Hansen   01/07/2013 -  Anti-estrogen oral therapy   Tamoxifen 20 mg daily   11/19/2016 Imaging   MRI brain: 2.9 similar mass within the inferior vermis with surrounding edema and local mass effect partially effacing the fourth ventricle.    11/21/2016 Relapse/Recurrence   Brain biopsy: Metastatic ductal carcinoma the breast positive for ER PR, GATA 3, negative for PAX8 and TTF-1   11/21/2016 Surgery   Suboccipital craniotomy for gross total resection of cerebellar tumor by Dr. Delma Officer     Interval History:  Tasha Ortiz presents today after recent MRI brain.  No new neurologic complaints. Continues to use a walker at  home and wheelchair "going out". She has completed physical therapy sessions.  No issues with new back pain or urge incontinence.     H+P: presents today to review her CNS disease burden from metastatic breast cancer, and associated neurologic deficits.  She initially described feeling "off balance" this summer, which led to an MRI in August demonstrating large posterior fossa metastasis.  This was resected on 11/21/16, and followed by post-op SRS to the resection cavity.  She describes clinical stability in the post-operative period, which was followed by significant decline during immediately following radiation, described as "inability to ambulate, dress self, feed self" due to poor balance and coordination.  These deficits have reportedly improved dramatically since that time- she is still reliant on a walker, but all other ADL's she is able to perform independently.  Occasionally she has bouts of vertigo, which are effectively treated with Meclizine.   Most recent MRI in January was stable.  She does acknowledge some neck and back pain recently along midline.  No urinary incontinence.  Dr. Pamelia Hoit had prescribed 1mg  decadron daily for appetite stimulation, which she says has helped considerably and would like to continue.  Medications: Current Outpatient Medications on File Prior to Visit  Medication Sig Dispense Refill   alendronate (FOSAMAX) 70 MG tablet Take 1 tablet (70 mg total) by mouth every 7 (seven) days. Take with a full glass of water on an empty stomach. 4 tablet 11   amLODipine (NORVASC) 10 MG tablet Take 1 tablet (10 mg total) by mouth daily. 90 tablet 3  anastrozole (ARIMIDEX) 1 MG tablet TAKE 1 TABLET BY MOUTH EVERY DAY 90 tablet 2   blood glucose meter kit and supplies KIT Dispense based on patient and insurance preference. Use up to four times daily as directed. (FOR ICD-9 250.00, 250.01). 1 each 0   dorzolamide-timolol (COSOPT) 22.3-6.8 MG/ML ophthalmic solution      fluticasone  (FLONASE) 50 MCG/ACT nasal spray Place 1 spray into both nostrils daily. 16 g 3   Lancets (ONETOUCH DELICA PLUS LANCET33G) MISC USE TO check blood sugar UP TO 4 TIMES DAILY     levothyroxine (SYNTHROID) 50 MCG tablet TAKE 1 TABLET BY MOUTH EVERY DAY BEFORE BREAKFAST 90 tablet 0   metoCLOPramide (REGLAN) 5 MG tablet 1 tab twice daily before breakfast and supper. May increase to QID (before meals and at bedtime) if needed. 120 tablet 2   ONETOUCH ULTRA test strip      potassium chloride (KLOR-CON M10) 10 MEQ tablet Take 1 tablet (10 mEq total) by mouth 2 (two) times daily. 180 tablet 3   pravastatin (PRAVACHOL) 40 MG tablet TAKE 1 TABLET BY MOUTH EVERY DAY 90 tablet 3   sertraline (ZOLOFT) 50 MG tablet Take 1 tablet daily 90 tablet 3   [DISCONTINUED] Prochlorperazine Maleate (COMPAZINE PO) Take by mouth.     No current facility-administered medications on file prior to visit.    Allergies:  Allergies  Allergen Reactions   Lisinopril Swelling   Past Medical History:  Past Medical History:  Diagnosis Date   Acid reflux    Allergy    seasonal   Breast cancer (HCC)    right,no iv or blood on right sid, chemo and radiation 2014   Family history of brain cancer    Family history of breast cancer    Heartburn    Hypercholesteremia    taken off chol meds Dec 2012   Hypertension    Hypertension    Personal history of chemotherapy 05/2012   rt breast   Personal history of radiation therapy 10/2012   rt breast   Past Surgical History:  Past Surgical History:  Procedure Laterality Date   BREAST BIOPSY Right 04/16/2012   BREAST SURGERY Right 05/20/12   Rt br mastectomy   CESAREAN SECTION  25 years ago   colonoscopy     COLONOSCOPY WITH PROPOFOL N/A 01/18/2015   Procedure: COLONOSCOPY WITH PROPOFOL;  Surgeon: Napoleon Form, MD;  Location: [facility];  Service: Endoscopy;  Laterality: N/A;   MASTECTOMY Right 05/2012   MODIFIED MASTECTOMY Right 05/20/2012   Procedure: MODIFIED  MASTECTOMY;  Surgeon: Mariella Saa, MD;  Location: [facility] OR;  Service: General;  Laterality: Right;   PORTACATH PLACEMENT Left 05/20/2012   Procedure: INSERTION PORT-A-CATH;  Surgeon: Mariella Saa, MD;  Location: [facility] OR;  Service: General;  Laterality: Left;   SUBOCCIPITAL CRANIECTOMY CERVICAL LAMINECTOMY N/A 11/21/2016   Procedure: SUBOCCIPITAL CRANIECTOMY RESECTION TUMOR;  Surgeon: Tressie Stalker, MD;  [facility];  Service: Neurosurgery;  Laterality: N/A;   TUBAL LIGATION     Social History:  Social History   Socioeconomic History   Marital status: Divorced    Spouse name: Not on file   Number of children: 2   Years of education: 16   Highest education level: Not on file  Occupational History   Occupation: Health visitor  Tobacco Use   Smoking status: Never   Smokeless tobacco: Former    Types: Chew    Quit date: 11/18/1996  Substance and Sexual Activity  Alcohol use: No    Alcohol/week: 0.0 standard drinks of alcohol   Drug use: No   Sexual activity: Never    Comment: menarche 14, P2, no HRT  Other Topics Concern   Not on file  Social History Narrative   Fun: E[employer]a [employer]   Denies religious beliefs effecting health care.    Social Drivers of Corporate investment banker Strain: Low Risk  (07/13/2022)   Overall Financial Resource Strain (CARDIA)    Difficulty of Paying Living Expenses: Not very hard  Food Insecurity: No Food Insecurity (07/13/2022)   Hunger Vital Sign    Worried About Running Out of Food in the Last Year: Never true    Ran Out of Food in the Last Year: Never true  Transportation Needs: No Transportation Needs (07/13/2022)   PRAPARE - Administrator, Civil Service (Medical): No    Lack of Transportation (Non-Medical): No  Physical Activity: Inactive (07/13/2022)   Exercise Vital Sign    Days of Exercise per Week: 0 days    Minutes of Exercise per Session: 0 min  Stress: No Stress Concern Present (07/13/2022)   [employer] of  Occupational Health - Occupational Stress Questionnaire    Feeling of Stress : Not at all  Social Connections: Unknown (07/13/2022)   Social Connection and Isolation Panel [NHANES]    Frequency of Communication with Friends and Family: Twice a week    Frequency of Social Gatherings with Friends and Family: Never    Attends Religious Services: Not on Marketing executive or Organizations: Patient declined    Attends Banker Meetings: Never    Marital Status: Divorced  Catering manager Violence: Not At Risk (07/13/2022)   Humiliation, Afraid, Rape, and Kick questionnaire    Fear of Current or Ex-Partner: No    Emotionally Abused: No    Physically Abused: No    Sexually Abused: No   Family History:  Family History  Problem Relation Age of Onset   Hypertension Mother    Aneurysm Mother 59   Brain cancer Sister 55   Breast cancer Cousin 40   Jaundice Father 31   Diabetes Daughter    Breast cancer Cousin 13   Breast cancer Cousin 45   Colon cancer Neg Hx     Review of Systems: Constitutional: Denies fevers, chills or abnormal weight loss Eyes: blurry distance vision Ears, nose, mouth, throat, and face: Denies mucositis or sore throat Respiratory: Denies cough, dyspnea or wheezes Cardiovascular: Denies palpitation, chest discomfort or lower extremity swelling Gastrointestinal:  Denies nausea, constipation, diarrhea GU: Denies dysuria or incontinence Skin: Denies abnormal skin rashes Neurological: Per HPI Musculoskeletal: midline back pain Behavioral/Psych: Denies anxiety, disturbance in thought content, and mood instability   Physical Exam: Vitals:   03/21/23 0900  BP: (!) 152/78  Pulse: 62  Resp: 18  Temp: (!) 97 F (36.1 C)  SpO2: 100%    KPS: 70. General: Alert, cooperative, pleasant, in no acute distress.  In wheelchair Head: Craniotomy scar noted, dry and intact. EENT: No conjunctival injection or scleral icterus. Oral mucosa moist Lungs:  Resp effort normal Cardiac: Regular rate and rhythm Abdomen: Soft, non-distended abdomen Skin: No rashes cyanosis or petechiae. Extremities: No clubbing or edema  Neurologic Exam: Mental Status: Awake, alert, attentive to examiner. Oriented to self and environment. Language is fluent with intact comprehension.  Cranial Nerves: Visual acuity is grossly normal. Visual fields are full. Extra-ocular movements intact. No ptosis. Face  is symmetric, tongue midline. Motor: Tone and bulk are normal. Power is full in both arms and legs. Hyperreflexia noted in bilateral legs (3+) with sustained clonus in right > left ankle.  Arm reflexes normal. Mild dysmetria noted on bilateral finger to nose. Sensory: Intact to light touch and temperature Gait: Dystaxic  Labs: I have reviewed the data as listed    Component Value Date/Time   NA 143 06/06/2022 0731   NA 143 07/05/2014 1101   K 3.6 06/06/2022 0731   K 3.7 07/05/2014 1101   CL 107 06/06/2022 0731   CL 103 09/23/2012 1040   CO2 27 06/06/2022 0731   CO2 27 07/05/2014 1101   GLUCOSE 148 (H) 06/06/2022 0731   GLUCOSE 113 07/05/2014 1101   GLUCOSE 140 (H) 09/23/2012 1040   BUN 17 06/06/2022 0731   BUN 11.8 07/05/2014 1101   CREATININE 0.77 06/06/2022 0731   CREATININE 0.55 12/21/2019 1027   CREATININE 0.8 07/05/2014 1101   CALCIUM 9.5 06/06/2022 0731   CALCIUM 9.4 07/05/2014 1101   PROT 7.7 06/06/2022 0731   PROT 6.6 07/05/2014 1101   ALBUMIN 4.2 06/06/2022 0731   ALBUMIN 3.4 (L) 07/05/2014 1101   AST 19 06/06/2022 0731   AST 18 07/05/2014 1101   ALT 20 06/06/2022 0731   ALT 15 07/05/2014 1101   ALKPHOS 77 06/06/2022 0731   ALKPHOS 45 07/05/2014 1101   BILITOT 0.3 06/06/2022 0731   BILITOT 0.21 07/05/2014 1101   GFRNONAA >60 06/06/2022 0731   GFRAA >60 12/01/2019 1248   GFRAA >60 07/15/2019 0825   Lab Results  Component Value Date   WBC 5.2 06/06/2022   NEUTROABS 2.2 06/06/2022   HGB 14.7 06/06/2022   HCT 43.8 06/06/2022   MCV  86.6 06/06/2022   PLT 230 06/06/2022    Imaging:  [facility] Clinician Interpretation: I have personally reviewed the CNS images as listed.  My interpretation, in the context of the patient's clinical presentation, is stable disease  MR BRAIN W WO CONTRAST Result Date: 03/14/2023 CLINICAL DATA:  Brain/CNS neoplasm. Assess treatment response. Occasional headache and balance disturbance. History of metastatic breast cancer. EXAM: MRI HEAD WITHOUT AND WITH CONTRAST TECHNIQUE: Multiplanar, multiecho pulse sequences of the brain and surrounding structures were obtained without and with intravenous contrast. CONTRAST:  7 cc Vueway COMPARISON:  03/16/2022 and previous. FINDINGS: Brain: Diffusion imaging does not show any acute or subacute infarction or other cause of restricted diffusion. Previous occipital craniotomy for resection of cerebellar mass initially seen in August of 2018. Regional atrophy and encephalomalacia with associated gliosis, consistent with postsurgical and post treatment changes. No sign of residual or recurrent tumor. No mass-effect or sign of progressive disease. Elsewhere, the brain shows generalized volume loss and chronic small-vessel ischemic changes of the deep and subcortical white matter. No evidence of supratentorial mass lesion, hemorrhage, hydrocephalus or extra-axial collection. No other abnormal brain or leptomeningeal enhancement. Vascular: Major vessels at the base of the brain show flow. Skull and upper cervical spine: Negative Sinuses/Orbits: Clear/normal Other: None IMPRESSION: 1. Stable examination. Previous occipital craniotomy for resection of cerebellar mass initially seen in August of 2018. Regional atrophy and encephalomalacia with associated gliosis, consistent with postsurgical and post treatment changes. No sign of residual or recurrent tumor. No mass-effect or sign of progressive disease. 2. Elsewhere, the brain shows generalized volume loss and chronic small-vessel  ischemic changes of the deep and subcortical white matter. Electronically Signed   By: Paulina Fusi M.D.   On: 03/14/2023 13:17  Assessment/Plan Malignant neoplasm metastatic to brain [facility])  Ms. Cirilo is clinically and radiographically stable today.  Recommended continuing imaging surveillance only.  We appreciate the opportunity to participate in the care of Tasha Ortiz.    We ask that Tasha Ortiz return to clinic in 18 months following next brain MRI, or sooner as needed.  All questions were answered. The patient knows to call the clinic with any problems, questions or concerns. No barriers to learning were detected.  I have spent a total of 30 minutes of face-to-face and non-face-to-face time, excluding clinical staff time, preparing to see patient, ordering tests and/or medications, counseling the patient, and independently interpreting results and communicating results to the patient/family/caregiver    Henreitta Leber, MD Medical Director of Neuro-Oncology [facility] 03/21/23 9:02 AM

## 2023-03-22 ENCOUNTER — Ambulatory Visit: Payer: Medicare PPO | Admitting: Gastroenterology

## 2023-03-22 ENCOUNTER — Encounter: Payer: Self-pay | Admitting: Gastroenterology

## 2023-03-22 VITALS — BP 120/70 | HR 72 | Ht 64.0 in | Wt 154.1 lb

## 2023-03-22 DIAGNOSIS — K6289 Other specified diseases of anus and rectum: Secondary | ICD-10-CM | POA: Diagnosis not present

## 2023-03-22 DIAGNOSIS — R350 Frequency of micturition: Secondary | ICD-10-CM | POA: Diagnosis not present

## 2023-03-22 DIAGNOSIS — R1032 Left lower quadrant pain: Secondary | ICD-10-CM

## 2023-03-22 DIAGNOSIS — K5900 Constipation, unspecified: Secondary | ICD-10-CM

## 2023-03-22 DIAGNOSIS — R109 Unspecified abdominal pain: Secondary | ICD-10-CM | POA: Diagnosis not present

## 2023-03-22 DIAGNOSIS — K602 Anal fissure, unspecified: Secondary | ICD-10-CM | POA: Diagnosis not present

## 2023-03-22 DIAGNOSIS — R194 Change in bowel habit: Secondary | ICD-10-CM

## 2023-03-22 MED ORDER — AMBULATORY NON FORMULARY MEDICATION: 0 refills | Status: AC

## 2023-03-22 MED ORDER — NA SULFATE-K SULFATE-MG SULF 17.5-3.13-1.6 GM/177ML PO SOLN: 1.0000 | ORAL | 0 refills | Status: DC

## 2023-03-22 NOTE — Progress Notes (Unsigned)
Tasha Ortiz    [id number]    29-Jul-1955  Primary Care Physician:Murray, Allyne Gee, FNP  Referring Physician: Olive Bass, FNP [address] [place],  [place] [address]   Chief complaint:  Rectal pain, Abd pain, constipation  Discussed the use of AI scribe software for clinical note transcription with the patient, who gave verbal consent to proceed.  History of Present Illness   The patient, with a history of abdominal pain, constipation, and rectal pain, presents for evaluation. The rectal pain has been ongoing for a couple of months, while the abdominal pain started about two weeks ago. The constipation has been a concern for approximately three months. The patient denies any instances of inserting anything into the rectum. However, they report having hard stools, which could potentially be the cause of the rectal tear.  The patient also reports a frequent need to urinate, which has been ongoing for an unspecified duration. They deny any sensation of something pulling down, vaginal discharge, burning, or blood in the urine.  The patient has been manually assisting bowel movements due to the difficulty of stool passage. The stool is described as lumpy when it does pass. The patient's weight has remained stable, and they deny any trouble swallowing, nausea, or vomiting. The patient's last colonoscopy was approximately nine years ago, in 2016.          Outpatient Encounter Medications as of 03/22/2023  Medication Sig   alendronate (FOSAMAX) 70 MG tablet Take 1 tablet (70 mg total) by mouth every 7 (seven) days. Take with a full glass of water on an empty stomach.   AMBULATORY NON FORMULARY MEDICATION Nitroglycerin ointment 0.125 %  Apply a pea sized amount internally four times daily.   amLODipine (NORVASC) 10 MG tablet Take 1 tablet (10 mg total) by mouth daily.   anastrozole (ARIMIDEX) 1 MG tablet TAKE 1 TABLET BY MOUTH EVERY DAY    blood glucose meter kit and supplies KIT Dispense based on patient and insurance preference. Use up to four times daily as directed. (FOR ICD-9 250.00, 250.01).   dorzolamide-timolol (COSOPT) 22.3-6.8 MG/ML ophthalmic solution    fluticasone (FLONASE) 50 MCG/ACT nasal spray Place 1 spray into both nostrils daily.   JARDIANCE 25 MG TABS tablet Take 25 mg by mouth daily.   Lancets (ONETOUCH DELICA PLUS LANCET33G) MISC USE TO check blood sugar UP TO 4 TIMES DAILY   levothyroxine (SYNTHROID) 50 MCG tablet TAKE 1 TABLET BY MOUTH EVERY DAY BEFORE BREAKFAST   Na Sulfate-K Sulfate-Mg Sulf (SUPREP BOWEL PREP KIT) 17.5-3.13-1.6 GM/177ML SOLN Take 1 kit by mouth as directed. For colonoscopy prep   ONETOUCH ULTRA test strip    potassium chloride (KLOR-CON M10) 10 MEQ tablet Take 1 tablet (10 mEq total) by mouth 2 (two) times daily.   pravastatin (PRAVACHOL) 40 MG tablet TAKE 1 TABLET BY MOUTH EVERY DAY   sertraline (ZOLOFT) 50 MG tablet Take 1 tablet daily   tobramycin (TOBREX) 0.3 % ophthalmic solution Place 1 drop into both eyes 3 (three) times daily.   [DISCONTINUED] metoCLOPramide (REGLAN) 5 MG tablet 1 tab twice daily before breakfast and supper. May increase to QID (before meals and at bedtime) if needed.   [DISCONTINUED] Prochlorperazine Maleate (COMPAZINE PO) Take by mouth.   No facility-administered encounter medications on file as of 03/22/2023.    Allergies as of 03/22/2023 - Review Complete 03/22/2023  Allergen Reaction Noted   Lisinopril Swelling  01/25/2015    Past Medical History:  Diagnosis Date   Acid reflux    Allergy    seasonal   Breast cancer (HCC)    right,no iv or blood on right sid, chemo and radiation 2014   DM (diabetes mellitus) (HCC)    Family history of brain cancer    Family history of breast cancer    Heartburn    Hypercholesteremia    taken off chol meds Dec 2012   Hypertension    Personal history of chemotherapy 05/2012   rt breast   Personal history of  radiation therapy 10/2012   rt breast    Past Surgical History:  Procedure Laterality Date   BREAST BIOPSY Right 04/16/2012   BREAST SURGERY Right 05/20/12   Rt br mastectomy   CESAREAN SECTION  25 years ago   colonoscopy     COLONOSCOPY WITH PROPOFOL N/A 01/18/2015   Procedure: COLONOSCOPY WITH PROPOFOL;  Surgeon: Napoleon Form, MD;  Location: [facility];  Service: Endoscopy;  Laterality: N/A;   MASTECTOMY Right 05/2012   MODIFIED MASTECTOMY Right 05/20/2012   Procedure: MODIFIED MASTECTOMY;  Surgeon: Mariella Saa, MD;  Location: [facility] OR;  Service: General;  Laterality: Right;   PORTACATH PLACEMENT Left 05/20/2012   Procedure: INSERTION PORT-A-CATH;  Surgeon: Mariella Saa, MD;  Location: [facility] OR;  Service: General;  Laterality: Left;   SUBOCCIPITAL CRANIECTOMY CERVICAL LAMINECTOMY N/A 11/21/2016   Procedure: SUBOCCIPITAL CRANIECTOMY RESECTION TUMOR;  Surgeon: Tressie Stalker, MD;  Location: [facility] OR;  Service: Neurosurgery;  Laterality: N/A;   TUBAL LIGATION      Family History  Problem Relation Age of Onset   Hypertension Mother    Aneurysm Mother 6       brain   Jaundice Father 56   Brain cancer Sister 47   Diabetes Daughter    Renal Disease Daughter    Hypertension Daughter    Anemia Daughter    Breast cancer Cousin 27   Breast cancer Cousin 62   Breast cancer Cousin 93   Colon cancer Neg Hx     Social History   Socioeconomic History   Marital status: Divorced    Spouse name: Not on file   Number of children: 2   Years of education: 16   Highest education level: Not on file  Occupational History   Occupation: Health visitor  Tobacco Use   Smoking status: Never   Smokeless tobacco: Former    Types: Chew    Quit date: 11/18/1996  Vaping Use   Vaping status: Never Used  Substance and Sexual Activity   Alcohol use: No    Alcohol/week: 0.0 standard drinks of alcohol   Drug use: No   Sexual activity: Never    Comment: menarche 14, P2, no HRT   Other Topics Concern   Not on file  Social History Narrative   Fun: [employer]   Denies religious beliefs effecting health care.    Social Drivers of Corporate investment banker Strain: Low Risk  (07/13/2022)   Overall Financial Resource Strain (CARDIA)    Difficulty of Paying Living Expenses: Not very hard  Food Insecurity: No Food Insecurity (07/13/2022)   Hunger Vital Sign    Worried About Running Out of Food in the Last Year: Never true    Ran Out of Food in the Last Year: Never true  Transportation Needs: No Transportation Needs (07/13/2022)   PRAPARE - Administrator, Civil Service (Medical): No  Lack of Transportation (Non-Medical): No  Physical Activity: Inactive (07/13/2022)   Exercise Vital Sign    Days of Exercise per Week: 0 days    Minutes of Exercise per Session: 0 min  Stress: No Stress Concern Present (07/13/2022)   [employer] of Occupational Health - Occupational Stress Questionnaire    Feeling of Stress : Not at all  Social Connections: Unknown (07/13/2022)   Social Connection and Isolation Panel [NHANES]    Frequency of Communication with Friends and Family: Twice a week    Frequency of Social Gatherings with Friends and Family: Never    Attends Religious Services: Not on Marketing executive or Organizations: Patient declined    Attends Banker Meetings: Never    Marital Status: Divorced  Catering manager Violence: Not At Risk (07/13/2022)   Humiliation, Afraid, Rape, and Kick questionnaire    Fear of Current or Ex-Partner: No    Emotionally Abused: No    Physically Abused: No    Sexually Abused: No      Review of systems: All other review of systems negative except as mentioned in the HPI.   Physical Exam: Vitals:   03/22/23 1425  BP: 120/70  Pulse: 72   Body mass index is 26.46 kg/m. Gen:      No acute distress HEENT:  sclera anicteric CV: s1s2 rrr, no murmur Lungs: B/l clear. Abd:      soft,  non-tender; no palpable masses, no distension Ext:    No edema Neuro: alert and oriented x 3 Psych: normal mood and affect  Data Reviewed:  Reviewed labs, radiology imaging, old records and pertinent past GI work up     Assessment and Plan    Anal Fissure Small tear in the rectum causing pain and possibly contributing to constipation. Likely due to hard stool. -Prescribe Nitroglycerin 0.125% cream, apply per rectum three times a day for two months to promote healing. -Recommend Benefiber, one tablespoon three times a day, and MiraLAX, half capful twice a day, to soften stool and ease bowel movements. Adjust dosage based on bowel habits.  Constipation Ongoing for three months, possibly exacerbated by anal fissure. -Recommend Benefiber, one tablespoon three times a day, and MiraLAX, half capful twice a day. Adjust dosage based on bowel habits.  Abdominal Pain New onset, started two weeks ago. Cause unclear. -Plan for colonoscopy on February 13th, 2025 to investigate cause.  Frequent Urination Cause unclear. -Recommend consultation with a urogynecologist or urologist to investigate cause.          This visit required >60 minutes of patient care (this includes precharting, chart review, review of results, face-to-face time used for counseling as well as treatment plan and follow-up. The patient was provided an opportunity to ask questions and all were answered. The patient agreed with the plan and demonstrated an understanding of the instructions.  Iona Beard , MD    CC: Olive Bass,*

## 2023-03-22 NOTE — Patient Instructions (Addendum)
VISIT SUMMARY:  During today's visit, we discussed your ongoing issues with abdominal pain, constipation, rectal pain, and frequent urination. We have developed a plan to address each of these concerns and will follow up as needed.  YOUR PLAN:  -ANAL FISSURE: An anal fissure is a small tear in the rectum that can cause pain and may contribute to constipation. This is likely due to hard stool. We have prescribed Nitroglycerin 0.125% cream to be applied to the rectum three times a day for two months to promote healing. Additionally, we recommend taking Benefiber, one tablespoon three times a day, and MiraLAX, half capful twice a day, to soften your stool and ease bowel movements. Please adjust the dosage based on your bowel habits.  We have sent a prescription for nitroglycerin 0.125% gel to [facility].   [facility]'s information is below: Address: [address], [place], [place] [address]  [phone]  *Please DO NOT go directly from our office to pick up this medication! Give the pharmacy 1 day to process the prescription as this is compounded and takes time to make.    -CONSTIPATION: Constipation is difficulty in passing stool and has been ongoing for three months, possibly worsened by the anal fissure. We recommend taking Benefiber, one tablespoon three times a day, and MiraLAX, half capful twice a day. Adjust the dosage based on your bowel habits.  -ABDOMINAL PAIN: Abdominal pain started two weeks ago, and the cause is currently unclear. We have scheduled a colonoscopy on February 13th, 2025, to investigate the cause.  -FREQUENT URINATION: Frequent urination means needing to urinate more often than usual. The cause is unclear, and we recommend consulting with a urogynecologist or urologist to investigate further.  INSTRUCTIONS:  Please follow up with the recommended colonoscopy on February 13th, 2025, and schedule a consultation with a urogynecologist or  urologist to investigate the cause of your frequent urination.   You have been scheduled for a colonoscopy. Please follow written instructions given to you at your visit today.   Please pick up your prep supplies at the pharmacy within the next 1-3 days.  If you use inhalers (even only as needed), please bring them with you on the day of your procedure.  DO NOT TAKE 7 DAYS PRIOR TO TEST- Trulicity (dulaglutide) Ozempic, Wegovy (semaglutide) Mounjaro (tirzepatide) Bydureon Bcise (exanatide extended release)  DO NOT TAKE 1 DAY PRIOR TO YOUR TEST Rybelsus (semaglutide) Adlyxin (lixisenatide) Victoza (liraglutide) Byetta (exanatide) ___________________________________________________________________________  We have sent the following medications to your pharmacy for you to pick up at your convenience: Suprep   Due to recent changes in healthcare laws, you may see the results of your imaging and laboratory studies on MyChart before your provider has had a chance to review them.  We understand that in some cases there may be results that are confusing or concerning to you. Not all laboratory results come back in the same time frame and the provider may be waiting for multiple results in order to interpret others.  Please give [place] 48 hours in order for your provider to thoroughly review all the results before contacting the office for clarification of your results.   Thank you for choosing me and  Gastroenterology.  Dr.Kavitha Nandigam

## 2023-04-02 DIAGNOSIS — R399 Unspecified symptoms and signs involving the genitourinary system: Secondary | ICD-10-CM | POA: Diagnosis not present

## 2023-04-02 DIAGNOSIS — Z6827 Body mass index (BMI) 27.0-27.9, adult: Secondary | ICD-10-CM | POA: Diagnosis not present

## 2023-04-02 DIAGNOSIS — N399 Disorder of urinary system, unspecified: Secondary | ICD-10-CM | POA: Diagnosis not present

## 2023-04-10 ENCOUNTER — Other Ambulatory Visit: Payer: Self-pay | Admitting: Family

## 2023-04-23 ENCOUNTER — Other Ambulatory Visit: Payer: Self-pay | Admitting: Family

## 2023-05-01 ENCOUNTER — Other Ambulatory Visit: Payer: Self-pay | Admitting: Family

## 2023-05-01 ENCOUNTER — Other Ambulatory Visit: Payer: Self-pay | Admitting: Hematology and Oncology

## 2023-05-08 ENCOUNTER — Ambulatory Visit: Payer: Medicare PPO | Admitting: Physician Assistant

## 2023-05-15 ENCOUNTER — Telehealth: Payer: Self-pay | Admitting: Gastroenterology

## 2023-05-15 MED ORDER — MAGNESIUM CITRATE PO SOLN: 1.0000 | Freq: Once | ORAL | 0 refills | Status: AC

## 2023-05-15 NOTE — Telephone Encounter (Signed)
Okay to proceed with procedure tomorrow, please send prescription for additional bottle of mag citrate and advised patient to drink it now and follow the bowel prep instructions/recommendations.  Thank you

## 2023-05-15 NOTE — Telephone Encounter (Signed)
Called daughter back and will have patient drink bottle of mag citrate, no further questions.  Mag Citrate sent to preferred pharmacy.

## 2023-05-15 NOTE — Telephone Encounter (Signed)
Called and spoke with daughter and Tasha Ortiz had 1 salad yesterday and has consumed popcorn and beans 3 of the last 5 days.  Will forward to Dr. Lavon Paganini to see if Tasha Ortiz needs to be rescheduled or not.

## 2023-05-15 NOTE — Telephone Encounter (Signed)
Inbound call from patient's daughter stating patient has eating restricted food up until yesterday 2.11. Daughter is requesting a call to discuss how to proceed with patients 2.13 colonoscopy. Please advise, thank you.

## 2023-05-16 ENCOUNTER — Ambulatory Visit (AMBULATORY_SURGERY_CENTER): Payer: Medicare PPO | Admitting: Gastroenterology

## 2023-05-16 ENCOUNTER — Encounter: Payer: Self-pay | Admitting: Gastroenterology

## 2023-05-16 VITALS — BP 110/66 | HR 72 | Temp 97.2°F | Resp 17 | Ht 64.0 in | Wt 154.0 lb

## 2023-05-16 DIAGNOSIS — R109 Unspecified abdominal pain: Secondary | ICD-10-CM | POA: Diagnosis not present

## 2023-05-16 DIAGNOSIS — K602 Anal fissure, unspecified: Secondary | ICD-10-CM | POA: Diagnosis not present

## 2023-05-16 DIAGNOSIS — K6289 Other specified diseases of anus and rectum: Secondary | ICD-10-CM | POA: Diagnosis not present

## 2023-05-16 DIAGNOSIS — K5900 Constipation, unspecified: Secondary | ICD-10-CM

## 2023-05-16 DIAGNOSIS — R194 Change in bowel habit: Secondary | ICD-10-CM

## 2023-05-16 DIAGNOSIS — R1032 Left lower quadrant pain: Secondary | ICD-10-CM

## 2023-05-16 MED ORDER — SODIUM CHLORIDE 0.9 % IV SOLN
500.0000 mL | Freq: Once | INTRAVENOUS | Status: DC
Start: 2023-05-16 — End: 2023-05-16

## 2023-05-16 NOTE — Op Note (Addendum)
[facility] Patient Name: Tasha Ortiz Procedure Date: 05/16/2023 8:36 AM MRN: [id number] Endoscopist: Napoleon Form , MD, [id number] Age: 68 Referring MD:  Date of Birth: [birth year] Gender: Female Account #: [id number] Procedure:                Colonoscopy Indications:              Generalized abdominal pain, Change in bowel habits,                            Constipation Medicines:                Monitored Anesthesia Care Procedure:                Pre-Anesthesia Assessment:                           - Prior to the procedure, a History and Physical                            was performed, and patient medications and                            allergies were reviewed. The patient's tolerance of                            previous anesthesia was also reviewed. The risks                            and benefits of the procedure and the sedation                            options and risks were discussed with the patient.                            All questions were answered, and informed consent                            was obtained. Prior Anticoagulants: The patient has                            taken no anticoagulant or antiplatelet agents. ASA                            Grade Assessment: II - A patient with mild systemic                            disease. After reviewing the risks and benefits,                            the patient was deemed in satisfactory condition to                            undergo the procedure.  After obtaining informed consent, the colonoscope                            was passed under direct vision. Throughout the                            procedure, the patient's blood pressure, pulse, and                            oxygen saturations were monitored continuously. The                            Olympus Scope [id number] was introduced through the                            anus and advanced to the the  cecum, identified by                            appendiceal orifice and ileocecal valve. The                            colonoscopy was technically difficult and complex                            due to inadequate bowel prep. The patient tolerated                            the procedure well. The quality of the bowel                            preparation was inadequate. The ileocecal valve,                            appendiceal orifice, and rectum were photographed.                            The quality of the bowel preparation was evaluated                            using the [facility] Bowel Preparation Scale)                            with scores of: Right Colon = 2 (minor amount of                            residual staining, small fragments of stool and/or                            opaque liquid, but mucosa seen well), Transverse                            Colon = 1 (portion of mucosa seen, but other areas  not well seen due to staining, residual stool                            and/or opaque liquid) and Left Colon = 0                            (unprepared, mucosa not seen due to solid stool                            that cannot be cleared or unseen proximal colon                            segment in a colonoscopy aborted due to inadequate                            bowel prep). The total BBPS score equals 3. Scope In: 8:44:02 AM Scope Out: 9:03:11 AM Scope Withdrawal Time: 0 hours 13 minutes 43 seconds  Total Procedure Duration: 0 hours 19 minutes 9 seconds  Findings:                 The perianal and digital rectal examinations were                            normal.                           A moderate amount of stool was found in the sigmoid                            colon, in the descending colon, in the transverse                            colon and in the ascending colon, interfering with                            visualization. Lavage  of the area was performed,                            resulting in incomplete clearance with continued                            poor visualization. Complications:            No immediate complications. Estimated Blood Loss:     Estimated blood loss: none. Impression:               - Preparation of the colon was inadequate.                           - Stool in the sigmoid colon, in the descending                            colon, in the transverse colon and in the ascending  colon.                           - No specimens collected. Recommendation:           - Patient has a contact number available for                            emergencies. The signs and symptoms of potential                            delayed complications were discussed with the                            patient. Return to normal activities tomorrow.                            Written discharge instructions were provided to the                            patient.                           - Resume previous diet.                           - Continue present medications.                           - Repeat colonoscopy at the next available                            appointment for surveillance.                           - For future colonoscopy the patient will require                            an extended preparation. If there are any                            questions, please contact the gastroenterologist. Napoleon Form, MD 05/16/2023 9:11:16 AM This report has been signed electronically.

## 2023-05-16 NOTE — Progress Notes (Signed)
Sedate, gd SR, tolerated procedure well, VSS, report to RN

## 2023-05-16 NOTE — Patient Instructions (Addendum)
Resume previous diet Continue present medications Repeat colonoscopy 06/27/23- 8 am, previsit 06/06/23 telephone call , Nurse will call at 1230   YOU HAD AN ENDOSCOPIC PROCEDURE TODAY AT THE [facility]:   Refer to the procedure report that was given to you for any specific questions about what was found during the examination.  If the procedure report does not answer your questions, please call your gastroenterologist to clarify.  If you requested that your care partner not be given the details of your procedure findings, then the procedure report has been included in a sealed envelope for you to review at your convenience later.  YOU SHOULD EXPECT: Some feelings of bloating in the abdomen. Passage of more gas than usual.  Walking can help get rid of the air that was put into your GI tract during the procedure and reduce the bloating. If you had a lower endoscopy (such as a colonoscopy or flexible sigmoidoscopy) you may notice spotting of blood in your stool or on the toilet paper. If you underwent a bowel prep for your procedure, you may not have a normal bowel movement for a few days.  Please Note:  You might notice some irritation and congestion in your nose or some drainage.  This is from the oxygen used during your procedure.  There is no need for concern and it should clear up in a day or so.  SYMPTOMS TO REPORT IMMEDIATELY:  Following lower endoscopy (colonoscopy):  Excessive amounts of blood in the stool  Significant tenderness or worsening of abdominal pains  Swelling of the abdomen that is new, acute  Fever of 100F or higher  For urgent or emergent issues, a gastroenterologist can be reached at any hour by calling [phone]. Do not use MyChart messaging for urgent concerns.   DIET:  We do recommend a small meal at first, but then you may proceed to your regular diet.  Drink plenty of fluids but you should avoid alcoholic beverages for 24 hours.  ACTIVITY:  You  should plan to take it easy for the rest of today and you should NOT DRIVE or use heavy machinery until tomorrow (because of the sedation medicines used during the test).    FOLLOW UP: Our staff will call the number listed on your records the next business day following your procedure.  We will call around 7:15- 8:00 am to check on you and address any questions or concerns that you may have regarding the information given to you following your procedure. If we do not reach you, we will leave a message.      SIGNATURES/CONFIDENTIALITY: You and/or your care partner have signed paperwork which will be entered into your electronic medical record.  These signatures attest to the fact that that the information above on your After Visit Summary has been reviewed and is understood.  Full responsibility of the confidentiality of this discharge information lies with you and/or your care-partner.

## 2023-05-16 NOTE — Progress Notes (Signed)
Pt's states no medical or surgical changes since previsit or office visit.

## 2023-05-16 NOTE — Progress Notes (Signed)
[facility] History and Physical   Primary Care Physician:  Olive Bass, FNP   Reason for Procedure:  Abdominal pain, change in bowel habits with worsening constipation  Plan:     colonoscopy with possible interventions as needed     HPI: Tasha Ortiz is a very pleasant 68 y.o. female here for  colonoscopy for evaluation of abdominal pain, change in bowel habits with worsening constipation. Please refer to office visit note March 22, 2023 for additional details  The risks and benefits as well as alternatives of endoscopic procedure(s) have been discussed and reviewed. All questions answered. The patient agrees to proceed.    Past Medical History:  Diagnosis Date   Acid reflux    Allergy    seasonal   Breast cancer (HCC)    right,no iv or blood on right sid, chemo and radiation 2014   DM (diabetes mellitus) (HCC)    Family history of brain cancer    Family history of breast cancer    Heartburn    Hypercholesteremia    taken off chol meds Dec 2012   Hypertension    Personal history of chemotherapy 05/2012   rt breast   Personal history of radiation therapy 10/2012   rt breast    Past Surgical History:  Procedure Laterality Date   BREAST BIOPSY Right 04/16/2012   BREAST SURGERY Right 05/20/12   Rt br mastectomy   CESAREAN SECTION  25 years ago   colonoscopy     COLONOSCOPY WITH PROPOFOL N/A 01/18/2015   Procedure: COLONOSCOPY WITH PROPOFOL;  Surgeon: [facility] Form, MD;  Location: [facility];  Service: Endoscopy;  Laterality: N/A;   MASTECTOMY Right 05/2012   MODIFIED MASTECTOMY Right 05/20/2012   Procedure: MODIFIED MASTECTOMY;  Surgeon: Mariella Saa, MD;  Location: [facility] OR;  Service: General;  Laterality: Right;   PORTACATH PLACEMENT Left 05/20/2012   Procedure: INSERTION PORT-A-CATH;  Surgeon: Mariella Saa, MD;  Location: [facility] OR;  Service: General;  Laterality: Left;   SUBOCCIPITAL CRANIECTOMY CERVICAL LAMINECTOMY  N/A 11/21/2016   Procedure: SUBOCCIPITAL CRANIECTOMY RESECTION TUMOR;  Surgeon: Tressie Stalker, MD;  Location: [facility];  Service: Neurosurgery;  Laterality: N/A;   TUBAL LIGATION      Prior to Admission medications   Medication Sig Start Date End Date Taking? Authorizing Provider  alendronate (FOSAMAX) 70 MG tablet Take 1 tablet (70 mg total) by mouth every 7 (seven) days. Take with a full glass of water on an empty stomach. 08/09/22  Yes Olive Bass, FNP  amLODipine (NORVASC) 10 MG tablet TAKE 1 TABLET BY MOUTH EVERY DAY 04/23/23  Yes Olive Bass, FNP  anastrozole (ARIMIDEX) 1 MG tablet TAKE 1 TABLET BY MOUTH EVERY DAY 05/01/23  Yes Serena Croissant, MD  blood glucose meter kit and supplies KIT Dispense based on patient and insurance preference. Use up to four times daily as directed. (FOR ICD-9 250.00, 250.01). 05/26/19  Yes Olive Bass, FNP  dorzolamide-timolol (COSOPT) 22.3-6.8 MG/ML ophthalmic solution  09/02/19  Yes [provider]  JARDIANCE 25 MG TABS tablet Take 25 mg by mouth daily.   Yes [provider]  Lancets (ONETOUCH DELICA PLUS LANCET33G) MISC USE TO check blood sugar UP TO 4 TIMES DAILY 12/02/19  Yes [provider]  levothyroxine (SYNTHROID) 50 MCG tablet Take 1 tablet (50 mcg total) by mouth daily before breakfast. Labs for further refills 05/01/23  Yes Olive Bass, FNP  [facility] ULTRA test strip  07/31/19  Yes  [provider]  potassium chloride (KLOR-CON M10) 10 MEQ tablet Take 1 tablet (10 mEq total) by mouth 2 (two) times daily. 04/10/23  Yes Olive Bass, FNP  pravastatin (PRAVACHOL) 40 MG tablet TAKE 1 TABLET BY MOUTH EVERY DAY 12/18/22  Yes Olive Bass, FNP  sertraline (ZOLOFT) 50 MG tablet Take 1 tablet daily 01/04/23  Yes Olive Bass, FNP  AMBULATORY NON FORMULARY MEDICATION Nitroglycerin ointment 0.125 %  Apply a pea sized amount internally four times daily. 03/22/23    Napoleon Form, MD  fluticasone (FLONASE) 50 MCG/ACT nasal spray Place 1 spray into both nostrils daily. Patient not taking: Reported on 05/16/2023 02/16/22   Olive Bass, FNP  tobramycin (TOBREX) 0.3 % ophthalmic solution Place 1 drop into both eyes 3 (three) times daily. 12/25/22   [provider]  Prochlorperazine Maleate (COMPAZINE PO) Take by mouth.  10/07/12  [provider]    Current Outpatient Medications  Medication Sig Dispense Refill   alendronate (FOSAMAX) 70 MG tablet Take 1 tablet (70 mg total) by mouth every 7 (seven) days. Take with a full glass of water on an empty stomach. 4 tablet 11   amLODipine (NORVASC) 10 MG tablet TAKE 1 TABLET BY MOUTH EVERY DAY 90 tablet 3   anastrozole (ARIMIDEX) 1 MG tablet TAKE 1 TABLET BY MOUTH EVERY DAY 90 tablet 2   blood glucose meter kit and supplies KIT Dispense based on patient and insurance preference. Use up to four times daily as directed. (FOR ICD-9 250.00, 250.01). 1 each 0   dorzolamide-timolol (COSOPT) 22.3-6.8 MG/ML ophthalmic solution      JARDIANCE 25 MG TABS tablet Take 25 mg by mouth daily.     Lancets (ONETOUCH DELICA PLUS LANCET33G) MISC USE TO check blood sugar UP TO 4 TIMES DAILY     levothyroxine (SYNTHROID) 50 MCG tablet Take 1 tablet (50 mcg total) by mouth daily before breakfast. Labs for further refills 30 tablet 0   ONETOUCH ULTRA test strip      potassium chloride (KLOR-CON M10) 10 MEQ tablet Take 1 tablet (10 mEq total) by mouth 2 (two) times daily. 180 tablet 0   pravastatin (PRAVACHOL) 40 MG tablet TAKE 1 TABLET BY MOUTH EVERY DAY 90 tablet 3   sertraline (ZOLOFT) 50 MG tablet Take 1 tablet daily 90 tablet 3   AMBULATORY NON FORMULARY MEDICATION Nitroglycerin ointment 0.125 %  Apply a pea sized amount internally four times daily. 30 g 0   fluticasone (FLONASE) 50 MCG/ACT nasal spray Place 1 spray into both nostrils daily. (Patient not taking: Reported on 05/16/2023) 16 g 3   tobramycin  (TOBREX) 0.3 % ophthalmic solution Place 1 drop into both eyes 3 (three) times daily.     Current Facility-Administered Medications  Medication Dose Route Frequency Provider Last Rate Last Admin   0.9 %  sodium chloride infusion  500 mL Intravenous Once Napoleon Form, MD        Allergies as of 05/16/2023 - Review Complete 05/16/2023  Allergen Reaction Noted   Lisinopril Swelling 01/25/2015    Family History  Problem Relation Age of Onset   Hypertension Mother    Aneurysm Mother 42       brain   Jaundice Father 36   Brain cancer Sister 52   Diabetes Daughter    Renal Disease Daughter    Hypertension Daughter    Anemia Daughter    Breast cancer Cousin 18   Breast cancer Cousin 68  Breast cancer Cousin 45   Colon cancer Neg Hx     Social History   Socioeconomic History   Marital status: Divorced    Spouse name: Not on file   Number of children: 2   Years of education: 16   Highest education level: Not on file  Occupational History   Occupation: Health visitor  Tobacco Use   Smoking status: Never   Smokeless tobacco: Former    Types: Chew    Quit date: 11/18/1996  Vaping Use   Vaping status: Never Used  Substance and Sexual Activity   Alcohol use: No    Alcohol/week: 0.0 standard drinks of alcohol   Drug use: No   Sexual activity: Never    Comment: menarche 14, P2, no HRT  Other Topics Concern   Not on file  Social History Narrative   Fun: [employer]   Denies religious beliefs effecting health care.    Social Drivers of Corporate investment banker Strain: Low Risk  (07/13/2022)   Overall Financial Resource Strain (CARDIA)    Difficulty of Paying Living Expenses: Not very hard  Food Insecurity: Low Risk  (04/19/2023)   Received from [facility]   Hunger Vital Sign    Worried About Running Out of Food in the Last Year: Never true    Ran Out of Food in the Last Year: Never true  Transportation Needs: No Transportation Needs (04/19/2023)   Received from  [employer]    In the past 12 months, has lack of reliable transportation kept you from medical appointments, meetings, work or from getting things needed for daily living? : No  Physical Activity: Inactive (07/13/2022)   Exercise Vital Sign    Days of Exercise per Week: 0 days    Minutes of Exercise per Session: 0 min  Stress: No Stress Concern Present (07/13/2022)   [employer] of Occupational Health - Occupational Stress Questionnaire    Feeling of Stress : Not at all  Social Connections: Unknown (07/13/2022)   Social Connection and Isolation Panel [NHANES]    Frequency of Communication with Friends and Family: Twice a week    Frequency of Social Gatherings with Friends and Family: Never    Attends Religious Services: Not on Marketing executive or Organizations: Patient declined    Attends Banker Meetings: Never    Marital Status: Divorced  Catering manager Violence: Not At Risk (07/13/2022)   Humiliation, Afraid, Rape, and Kick questionnaire    Fear of Current or Ex-Partner: No    Emotionally Abused: No    Physically Abused: No    Sexually Abused: No    Review of Systems:  All other review of systems negative except as mentioned in the HPI.  Physical Exam: Vital signs in last 24 hours: BP 123/78   Pulse 72   Temp (!) 97.2 F (36.2 C) (Skin)   Resp 15   Ht 5\' 4"  (1.626 m)   Wt 154 lb (69.9 kg)   LMP 08/16/2012   SpO2 97%   BMI 26.43 kg/m  General:   Alert, NAD Lungs:  Clear .   Heart:  Regular rate and rhythm Abdomen:  Soft, nontender and nondistended. Neuro/Psych:  Alert and cooperative. Normal mood and affect. A and O x 3  Reviewed labs, radiology imaging, old records and pertinent past GI work up  Patient is appropriate for planned procedure(s) and anesthesia in an ambulatory setting   K. Tyrone Nine  Lavon Paganini , MD [phone]

## 2023-05-17 ENCOUNTER — Telehealth: Payer: Self-pay

## 2023-05-17 NOTE — Telephone Encounter (Signed)
Left message on follow up call.

## 2023-05-25 ENCOUNTER — Other Ambulatory Visit: Payer: Self-pay | Admitting: Family

## 2023-05-27 ENCOUNTER — Encounter: Payer: Self-pay | Admitting: Hematology and Oncology

## 2023-05-27 NOTE — Telephone Encounter (Signed)
 She needs to get her thyroid level checked. I think we can just do lab appointment. Where would she like to get this done?

## 2023-05-28 ENCOUNTER — Other Ambulatory Visit: Payer: Self-pay | Admitting: Family

## 2023-05-28 DIAGNOSIS — E782 Mixed hyperlipidemia: Secondary | ICD-10-CM

## 2023-05-28 DIAGNOSIS — E1169 Type 2 diabetes mellitus with other specified complication: Secondary | ICD-10-CM

## 2023-05-28 DIAGNOSIS — E039 Hypothyroidism, unspecified: Secondary | ICD-10-CM

## 2023-06-06 ENCOUNTER — Ambulatory Visit: Payer: Medicare PPO

## 2023-06-06 ENCOUNTER — Other Ambulatory Visit: Payer: Self-pay | Admitting: *Deleted

## 2023-06-06 VITALS — Ht 64.0 in | Wt 154.0 lb

## 2023-06-06 DIAGNOSIS — R194 Change in bowel habit: Secondary | ICD-10-CM

## 2023-06-06 DIAGNOSIS — C7931 Secondary malignant neoplasm of brain: Secondary | ICD-10-CM

## 2023-06-06 DIAGNOSIS — K6289 Other specified diseases of anus and rectum: Secondary | ICD-10-CM

## 2023-06-06 DIAGNOSIS — K5900 Constipation, unspecified: Secondary | ICD-10-CM

## 2023-06-06 DIAGNOSIS — R1032 Left lower quadrant pain: Secondary | ICD-10-CM

## 2023-06-06 DIAGNOSIS — K602 Anal fissure, unspecified: Secondary | ICD-10-CM

## 2023-06-06 MED ORDER — NA SULFATE-K SULFATE-MG SULF 17.5-3.13-1.6 GM/177ML PO SOLN: 1.0000 | Freq: Once | ORAL | 0 refills | Status: AC

## 2023-06-06 MED ORDER — ONDANSETRON HCL 4 MG PO TABS: 4.0000 mg | ORAL_TABLET | ORAL | 0 refills | Status: AC

## 2023-06-06 NOTE — Progress Notes (Signed)
 No egg or soy allergy known to patient  No issues known to pt with past sedation with any surgeries or procedures Patient denies ever being told they had issues or difficulty with intubation  No FH of Malignant Hyperthermia Pt is not on diet pills Pt is not on  home 02  Pt is not on blood thinners  Pt denies issues with constipation  No A fib or A flutter Have any cardiac testing pending--no LOA: independent  Prep: 2 day suprep   Patient's chart reviewed by Cathlyn Parsons CNRA prior to previsit and patient appropriate for the [facility].  Previsit completed and red dot placed by patient's name on their procedure day (on provider's schedule).     PV completed with patient. Prep instructions sent via mychart and home address.

## 2023-06-10 ENCOUNTER — Encounter (HOSPITAL_COMMUNITY): Payer: Self-pay

## 2023-06-10 ENCOUNTER — Ambulatory Visit (HOSPITAL_COMMUNITY)
Admission: RE | Admit: 2023-06-10 | Discharge: 2023-06-10 | Disposition: A | Discharge status: Home / Self Care | Payer: Medicare PPO | Source: Ambulatory Visit | Attending: Hematology and Oncology | Admitting: Hematology and Oncology

## 2023-06-10 ENCOUNTER — Inpatient Hospital Stay: Payer: Medicare PPO | Attending: Internal Medicine

## 2023-06-10 DIAGNOSIS — Z9221 Personal history of antineoplastic chemotherapy: Secondary | ICD-10-CM | POA: Insufficient documentation

## 2023-06-10 DIAGNOSIS — Z923 Personal history of irradiation: Secondary | ICD-10-CM | POA: Diagnosis not present

## 2023-06-10 DIAGNOSIS — R918 Other nonspecific abnormal finding of lung field: Secondary | ICD-10-CM | POA: Diagnosis not present

## 2023-06-10 DIAGNOSIS — Z170 Estrogen receptor positive status [ER+]: Secondary | ICD-10-CM | POA: Insufficient documentation

## 2023-06-10 DIAGNOSIS — Z79811 Long term (current) use of aromatase inhibitors: Secondary | ICD-10-CM | POA: Insufficient documentation

## 2023-06-10 DIAGNOSIS — Z1721 Progesterone receptor positive status: Secondary | ICD-10-CM | POA: Insufficient documentation

## 2023-06-10 DIAGNOSIS — Z9011 Acquired absence of right breast and nipple: Secondary | ICD-10-CM | POA: Insufficient documentation

## 2023-06-10 DIAGNOSIS — C7931 Secondary malignant neoplasm of brain: Secondary | ICD-10-CM

## 2023-06-10 DIAGNOSIS — Z1732 Human epidermal growth factor receptor 2 negative status: Secondary | ICD-10-CM | POA: Diagnosis not present

## 2023-06-10 DIAGNOSIS — C50411 Malignant neoplasm of upper-outer quadrant of right female breast: Secondary | ICD-10-CM | POA: Insufficient documentation

## 2023-06-10 DIAGNOSIS — R739 Hyperglycemia, unspecified: Secondary | ICD-10-CM | POA: Insufficient documentation

## 2023-06-10 LAB — CBC WITH DIFFERENTIAL (CANCER CENTER ONLY)
Abs Immature Granulocytes: 0.01 10*3/uL (ref 0.00–0.07)
Basophils Absolute: 0.1 10*3/uL (ref 0.0–0.1)
Basophils Relative: 1 %
Eosinophils Absolute: 0.1 10*3/uL (ref 0.0–0.5)
Eosinophils Relative: 2 %
HCT: 46 % (ref 36.0–46.0)
Hemoglobin: 15 g/dL (ref 12.0–15.0)
Immature Granulocytes: 0 %
Lymphocytes Relative: 41 %
Lymphs Abs: 2 10*3/uL (ref 0.7–4.0)
MCH: 28.8 pg (ref 26.0–34.0)
MCHC: 32.6 g/dL (ref 30.0–36.0)
MCV: 88.5 fL (ref 80.0–100.0)
Monocytes Absolute: 0.4 10*3/uL (ref 0.1–1.0)
Monocytes Relative: 9 %
Neutro Abs: 2.3 10*3/uL (ref 1.7–7.7)
Neutrophils Relative %: 47 %
Platelet Count: 201 10*3/uL (ref 150–400)
RBC: 5.2 MIL/uL — ABNORMAL HIGH (ref 3.87–5.11)
RDW: 13.3 % (ref 11.5–15.5)
WBC Count: 4.9 10*3/uL (ref 4.0–10.5)
nRBC: 0 % (ref 0.0–0.2)

## 2023-06-10 LAB — CMP (CANCER CENTER ONLY)
ALT: 17 U/L (ref 0–44)
AST: 20 U/L (ref 15–41)
Albumin: 4.5 g/dL (ref 3.5–5.0)
Alkaline Phosphatase: 53 U/L (ref 38–126)
Anion gap: 8 (ref 5–15)
BUN: 13 mg/dL (ref 8–23)
CO2: 27 mmol/L (ref 22–32)
Calcium: 9.7 mg/dL (ref 8.9–10.3)
Chloride: 106 mmol/L (ref 98–111)
Creatinine: 0.75 mg/dL (ref 0.44–1.00)
GFR, Estimated: >60 mL/min (ref >60)
Glucose, Bld: 158 mg/dL — ABNORMAL HIGH (ref 70–99)
Potassium: 4 mmol/L (ref 3.5–5.1)
Sodium: 141 mmol/L (ref 135–145)
Total Bilirubin: 0.4 mg/dL (ref 0.0–1.2)
Total Protein: 8.1 g/dL (ref 6.5–8.1)

## 2023-06-10 MED ORDER — IOHEXOL 300 MG/ML  SOLN
100.0000 mL | Freq: Once | INTRAMUSCULAR | Status: AC | PRN
  Administered 2023-06-10: 100 mL via INTRAVENOUS

## 2023-06-12 ENCOUNTER — Inpatient Hospital Stay: Payer: Medicare PPO | Admitting: Hematology and Oncology

## 2023-06-12 ENCOUNTER — Telehealth: Payer: Self-pay | Admitting: Hematology and Oncology

## 2023-06-12 VITALS — BP 147/72 | HR 67 | Temp 97.6°F | Resp 18 | Ht 64.0 in | Wt 151.2 lb

## 2023-06-12 DIAGNOSIS — C50411 Malignant neoplasm of upper-outer quadrant of right female breast: Secondary | ICD-10-CM

## 2023-06-12 DIAGNOSIS — C7931 Secondary malignant neoplasm of brain: Secondary | ICD-10-CM | POA: Diagnosis not present

## 2023-06-12 NOTE — Assessment & Plan Note (Signed)
 Metastatic breast cancer with brain metastases: Cerebellar lesion 2.9 cm in size status post gross total resection and Dr. Lovell Sheehan on 11/21/2016   (Right breast invasive ductal carcinoma multifocal disease ER/PR positive HER-2 negative T2, N1, M0 stage IIB status post mastectomy, adjuvant chemotherapy and radiation now on antiestrogen therapy with tamoxifen since October 2014.)   Current treatment: Anastrozole Anastrozole toxicities: Denies any hot flashes or myalgias    CT CAP 05/30/21: No evidence of metastatic disease.  Hepatic steatosis. Brain MRI 03/18/2020: Stable post resection of posterior fossa tumor. CT CAP 06/06/2022: No evidence of metastatic disease, fatty liver CT CAP 06/10/2023: Stable tiny scattered bilateral pulmonary nodules favored benign.  New urinary bladder wall thickening 03/14/2023: Brain MRI: Stable postsurgical changes   Balance issues and lack of strength in lower extremities:  Using a cane (she used to use a walker) Xarelto discontinued March 2024 Urinary bladder wall thickening: Will refer to urology.  Return to clinic in 1 year for follow-up

## 2023-06-12 NOTE — Progress Notes (Signed)
 Patient Care Team: Olive Bass, FNP as PCP - General (Internal Medicine)  DIAGNOSIS:  Encounter Diagnosis  Name Primary?   Primary cancer of upper outer quadrant of right female breast (HCC) Yes    SUMMARY OF ONCOLOGIC HISTORY: Oncology History  Primary cancer of upper outer quadrant of right female breast (HCC)  04/16/2012 Initial Biopsy   Right breast mass biopsy invasive ductal carcinoma with abundant mucin in all the 3 masses, right axillary lymph node biopsy positive for IDC, ER 100% PR percent Ki-67 19%, HER-2 negative ratio 0.94   04/21/2012 Breast MRI   Right breast 3 masses 11o'clock position 1.8 cm, now 11:00 position 2 cm, and 10:00 position 2.3 cm with an abnormal level I right axillary lymph node 3 cm   05/20/2012 Surgery   Right breast mastectomy invasive ductal carcinoma grade 2; 1.9 cm, 2.5 cm, 1.8 cm, 1/17 LN positive, second and third massive grade 3   06/24/2012 - 10/07/2012 Chemotherapy   Adjuvant chemotherapy with Taxotere Cytoxan x6 cycles   10/16/2012 - 12/16/2012 Radiation Therapy   Adjuvant radiation therapy by Dr. Mitzi Hansen   01/07/2013 -  Anti-estrogen oral therapy   Tamoxifen 20 mg daily   11/19/2016 Imaging   MRI brain: 2.9 similar mass within the inferior vermis with surrounding edema and local mass effect partially effacing the fourth ventricle.    11/21/2016 Relapse/Recurrence   Brain biopsy: Metastatic ductal carcinoma the breast positive for ER PR, GATA 3, negative for PAX8 and TTF-1   11/21/2016 Surgery   Suboccipital craniotomy for gross total resection of cerebellar tumor by Dr. Delma Officer     CHIEF COMPLIANT: Follow-up of metastatic breast cancer to review the results of scans  HISTORY OF PRESENT ILLNESS:  History of Present Illness The patient, with a history of cancer, presents for a follow-up visit after treatment. She reports that she has been doing well with no new or concerning symptoms. Recent scans show no signs of cancer,  but there was noted bladder thickening. She has been referred to a urologist for further evaluation and is scheduled for a colonoscopy on March 27th.  The patient is currently on anastrozole, with no reported side effects. She also had a brain MRI, which showed no abnormalities. The patient has a history of high glucose levels and is actively managing this condition with her primary care provider.  Additionally, the patient has been experiencing balance issues and has expressed interest in physical therapy for strengthening and balance improvement.     ALLERGIES:  is allergic to lisinopril.  MEDICATIONS:  Current Outpatient Medications  Medication Sig Dispense Refill   alendronate (FOSAMAX) 70 MG tablet Take 1 tablet (70 mg total) by mouth every 7 (seven) days. Take with a full glass of water on an empty stomach. 4 tablet 11   AMBULATORY NON FORMULARY MEDICATION Nitroglycerin ointment 0.125 %  Apply a pea sized amount internally four times daily. 30 g 0   amLODipine (NORVASC) 10 MG tablet TAKE 1 TABLET BY MOUTH EVERY DAY 90 tablet 3   anastrozole (ARIMIDEX) 1 MG tablet TAKE 1 TABLET BY MOUTH EVERY DAY 90 tablet 2   blood glucose meter kit and supplies KIT Dispense based on patient and insurance preference. Use up to four times daily as directed. (FOR ICD-9 250.00, 250.01). 1 each 0   dorzolamide-timolol (COSOPT) 22.3-6.8 MG/ML ophthalmic solution      fluticasone (FLONASE) 50 MCG/ACT nasal spray Place 1 spray into both nostrils daily. (Patient not taking: Reported on  06/06/2023) 16 g 3   JARDIANCE 25 MG TABS tablet Take 25 mg by mouth daily.     Lancets (ONETOUCH DELICA PLUS LANCET33G) MISC USE TO check blood sugar UP TO 4 TIMES DAILY     levothyroxine (SYNTHROID) 50 MCG tablet TAKE 1 TABLET (50 MCG TOTAL) BY MOUTH DAILY BEFORE BREAKFAST. LABS FOR FURTHER REFILLS 30 tablet 0   ondansetron (ZOFRAN) 4 MG tablet Take 1 tablet (4 mg total) by mouth as directed. Take 45 min - 1 hour before drinking  each dose of your prep solution 3 tablet 0   ONETOUCH ULTRA test strip      potassium chloride (KLOR-CON M10) 10 MEQ tablet Take 1 tablet (10 mEq total) by mouth 2 (two) times daily. 180 tablet 0   pravastatin (PRAVACHOL) 40 MG tablet TAKE 1 TABLET BY MOUTH EVERY DAY 90 tablet 3   sertraline (ZOLOFT) 50 MG tablet Take 1 tablet daily 90 tablet 3   tobramycin (TOBREX) 0.3 % ophthalmic solution Place 1 drop into both eyes 3 (three) times daily.     No current facility-administered medications for this visit.    PHYSICAL EXAMINATION: ECOG PERFORMANCE STATUS: 1 - Symptomatic but completely ambulatory  Vitals:   06/12/23 0819  BP: (!) 147/72  Pulse: 67  Resp: 18  Temp: 97.6 F (36.4 C)  SpO2: 98%   Filed Weights   06/12/23 0819  Weight: 151 lb 3.2 oz (68.6 kg)    Physical Exam   (exam performed in the presence of a chaperone)  LABORATORY DATA:  I have reviewed the data as listed    Latest Ref Rng & Units 06/10/2023    7:37 AM 06/06/2022    7:31 AM 02/16/2022   11:00 AM  CMP  Glucose 70 - 99 mg/dL 161  096  045   BUN 8 - 23 mg/dL 13  17  12    Creatinine 0.44 - 1.00 mg/dL 4.09  8.11  9.14   Sodium 135 - 145 mmol/L 141  143  142   Potassium 3.5 - 5.1 mmol/L 4.0  3.6  3.8   Chloride 98 - 111 mmol/L 106  107  105   CO2 22 - 32 mmol/L 27  27  29    Calcium 8.9 - 10.3 mg/dL 9.7  9.5  9.7   Total Protein 6.5 - 8.1 g/dL 8.1  7.7  7.8   Total Bilirubin 0.0 - 1.2 mg/dL 0.4  0.3  0.4   Alkaline Phos 38 - 126 U/L 53  77  80   AST 15 - 41 U/L 20  19  18    ALT 0 - 44 U/L 17  20  20      Lab Results  Component Value Date   WBC 4.9 06/10/2023   HGB 15.0 06/10/2023   HCT 46.0 06/10/2023   MCV 88.5 06/10/2023   PLT 201 06/10/2023   NEUTROABS 2.3 06/10/2023    ASSESSMENT & PLAN:  Primary cancer of upper outer quadrant of right female breast (HCC) Metastatic breast cancer with brain metastases: Cerebellar lesion 2.9 cm in size status post gross total resection and Dr. Lovell Sheehan on  11/21/2016   (Right breast invasive ductal carcinoma multifocal disease ER/PR positive HER-2 negative T2, N1, M0 stage IIB status post mastectomy, adjuvant chemotherapy and radiation now on antiestrogen therapy with tamoxifen since October 2014.)   Current treatment: Anastrozole Anastrozole toxicities: Denies any hot flashes or myalgias    CT CAP 05/30/21: No evidence of metastatic disease.  Hepatic steatosis.  Brain MRI 03/18/2020: Stable post resection of posterior fossa tumor. CT CAP 06/06/2022: No evidence of metastatic disease, fatty liver CT CAP 06/10/2023: Stable tiny scattered bilateral pulmonary nodules favored benign.  New urinary bladder wall thickening 03/14/2023: Brain MRI: Stable postsurgical changes   Balance issues and lack of strength in lower extremities:  Using a cane (she used to use a walker) I recommend physical therapy to strengthen her.  Xarelto discontinued March 2024 Urinary bladder wall thickening: Patient is following up with urology.  Return to clinic in 1 year for follow-up ------------------------------------- Assessment and Plan Assessment & Plan Primary cancer of upper outer quadrant of right female breast [facility]) Recent scan shows benign nodules, no cancer concerns. Anastrozole well-tolerated. Brain MRI normal. Current management effective. - Continue anastrozole 1 MG oral daily. - Repeat scan in one year.  Bladder wall thickening Scan revealed bladder wall thickening. Referred to urologist for further evaluation. - Follow up with urologist on March 27.  Elevated blood glucose Blood glucose 158 mg/dL, ZOX0R 6.0%. Hyperglycemia noted. She is aware and monitoring glucose levels. Previous weight loss improved glucose levels. - Discuss glucose management with healthcare provider.  Balance issues and lack of strength in lower extremities Reports balance issues and desire to improve strength. Physical therapy recommended. - Send referral for physical therapy  to strengthen lower extremities.      No orders of the defined types were placed in this encounter.  The patient has a good understanding of the overall plan. she agrees with it. she will call with any problems that may develop before the next visit here. Total time spent: 30 mins including face to face time and time spent for planning, charting and co-ordination of care   Tamsen Meek, MD 06/12/23

## 2023-06-12 NOTE — Telephone Encounter (Signed)
Scheduled appointments per 3/12 los. Patient is aware of the made appointments.

## 2023-06-23 ENCOUNTER — Encounter: Payer: Self-pay | Admitting: Certified Registered Nurse Anesthetist

## 2023-06-24 ENCOUNTER — Other Ambulatory Visit: Payer: Self-pay | Admitting: Family

## 2023-06-24 ENCOUNTER — Encounter: Payer: Self-pay | Admitting: Gastroenterology

## 2023-06-27 ENCOUNTER — Ambulatory Visit (AMBULATORY_SURGERY_CENTER): Payer: Medicare PPO | Admitting: Gastroenterology

## 2023-06-27 ENCOUNTER — Encounter: Payer: Self-pay | Admitting: Gastroenterology

## 2023-06-27 VITALS — BP 124/68 | HR 67 | Temp 98.2°F | Resp 17 | Ht 64.0 in | Wt 144.2 lb

## 2023-06-27 DIAGNOSIS — K602 Anal fissure, unspecified: Secondary | ICD-10-CM

## 2023-06-27 DIAGNOSIS — K6289 Other specified diseases of anus and rectum: Secondary | ICD-10-CM

## 2023-06-27 DIAGNOSIS — Z1211 Encounter for screening for malignant neoplasm of colon: Secondary | ICD-10-CM | POA: Diagnosis not present

## 2023-06-27 DIAGNOSIS — R194 Change in bowel habit: Secondary | ICD-10-CM

## 2023-06-27 DIAGNOSIS — K5900 Constipation, unspecified: Secondary | ICD-10-CM

## 2023-06-27 DIAGNOSIS — K644 Residual hemorrhoidal skin tags: Secondary | ICD-10-CM

## 2023-06-27 DIAGNOSIS — R1032 Left lower quadrant pain: Secondary | ICD-10-CM

## 2023-06-27 DIAGNOSIS — K648 Other hemorrhoids: Secondary | ICD-10-CM | POA: Diagnosis not present

## 2023-06-27 MED ORDER — SODIUM CHLORIDE 0.9 % IV SOLN
500.0000 mL | Freq: Once | INTRAVENOUS | Status: DC
Start: 2023-06-27 — End: 2023-06-27

## 2023-06-27 NOTE — Progress Notes (Signed)
 Report given to PACU, vss

## 2023-06-27 NOTE — Progress Notes (Signed)
 Pt's states no medical or surgical changes since previsit or office visit.

## 2023-06-27 NOTE — Patient Instructions (Addendum)
-  Handout on hemorrhoids and diverticulosis provided -repeat colonoscopy for surveillance in 10 years recommended   -Continue present medications   -Follow up appointment in 3 months recommended (they will follow up with you)  YOU HAD AN ENDOSCOPIC PROCEDURE TODAY AT THE [facility]:   Refer to the procedure report that was given to you for any specific questions about what was found during the examination.  If the procedure report does not answer your questions, please call your gastroenterologist to clarify.  If you requested that your care partner not be given the details of your procedure findings, then the procedure report has been included in a sealed envelope for you to review at your convenience later.  YOU SHOULD EXPECT: Some feelings of bloating in the abdomen. Passage of more gas than usual.  Walking can help get rid of the air that was put into your GI tract during the procedure and reduce the bloating. If you had a lower endoscopy (such as a colonoscopy or flexible sigmoidoscopy) you may notice spotting of blood in your stool or on the toilet paper. If you underwent a bowel prep for your procedure, you may not have a normal bowel movement for a few days.  Please Note:  You might notice some irritation and congestion in your nose or some drainage.  This is from the oxygen used during your procedure.  There is no need for concern and it should clear up in a day or so.  SYMPTOMS TO REPORT IMMEDIATELY:  Following lower endoscopy (colonoscopy or flexible sigmoidoscopy):  Excessive amounts of blood in the stool  Significant tenderness or worsening of abdominal pains  Swelling of the abdomen that is new, acute  Fever of 100F or higher  For urgent or emergent issues, a gastroenterologist can be reached at any hour by calling [phone]. Do not use MyChart messaging for urgent concerns.    DIET:  We do recommend a small meal at first, but then you may proceed to your  regular diet.  Drink plenty of fluids but you should avoid alcoholic beverages for 24 hours.  ACTIVITY:  You should plan to take it easy for the rest of today and you should NOT DRIVE or use heavy machinery until tomorrow (because of the sedation medicines used during the test).    FOLLOW UP: Our staff will call the number listed on your records the next business day following your procedure.  We will call around 7:15- 8:00 am to check on you and address any questions or concerns that you may have regarding the information given to you following your procedure. If we do not reach you, we will leave a message.     If any biopsies were taken you will be contacted by phone or by letter within the next 1-3 weeks.  Please call us at [phone] if you have not heard about the biopsies in 3 weeks.    SIGNATURES/CONFIDENTIALITY: You and/or your care partner have signed paperwork which will be entered into your electronic medical record.  These signatures attest to the fact that that the information above on your After Visit Summary has been reviewed and is understood.  Full responsibility of the confidentiality of this discharge information lies with you and/or your care-partner.

## 2023-06-27 NOTE — Op Note (Signed)
 [facility] Patient Name: Tasha Ortiz Procedure Date: 06/27/2023 9:25 AM MRN: [id number] Endoscopist: Napoleon Form , MD, [id number] Age: 68 Referring MD:  Date of Birth: Dec 04, 1955 Gender: Female Account #: [id number] Procedure:                Colonoscopy Indications:              Screening for colorectal malignant neoplasm Medicines:                Monitored Anesthesia Care Procedure:                Pre-Anesthesia Assessment:                           - Prior to the procedure, a History and Physical                            was performed, and patient medications and                            allergies were reviewed. The patient's tolerance of                            previous anesthesia was also reviewed. The risks                            and benefits of the procedure and the sedation                            options and risks were discussed with the patient.                            All questions were answered, and informed consent                            was obtained. Prior Anticoagulants: The patient has                            taken no anticoagulant or antiplatelet agents. ASA                            Grade Assessment: III - A patient with severe                            systemic disease. After reviewing the risks and                            benefits, the patient was deemed in satisfactory                            condition to undergo the procedure.                           After obtaining informed consent, the colonoscope  was passed under direct vision. Throughout the                            procedure, the patient's blood pressure, pulse, and                            oxygen saturations were monitored continuously. The                            PCF-HQ190L Colonoscope [id number] was introduced                            through the anus and advanced to the the cecum,                             identified by appendiceal orifice and ileocecal                            valve. The colonoscopy was performed without                            difficulty. The patient tolerated the procedure                            well. The quality of the bowel preparation was                            good. The ileocecal valve, appendiceal orifice, and                            rectum were photographed. Scope In: 9:32:25 AM Scope Out: 9:42:56 AM Scope Withdrawal Time: 0 hours 6 minutes 53 seconds  Total Procedure Duration: 0 hours 10 minutes 31 seconds  Findings:                 The perianal and digital rectal examinations were                            normal.                           Scattered medium-mouthed and small-mouthed                            diverticula were found in the sigmoid colon.                           Non-bleeding external and internal hemorrhoids were                            found during retroflexion. The hemorrhoids were                            medium-sized. Complications:            No immediate complications. Estimated Blood Loss:  Estimated blood loss was minimal. Impression:               - Diverticulosis in the sigmoid colon.                           - Non-bleeding external and internal hemorrhoids.                           - No specimens collected. Recommendation:           - Patient has a contact number available for                            emergencies. The signs and symptoms of potential                            delayed complications were discussed with the                            patient. Return to normal activities tomorrow.                            Written discharge instructions were provided to the                            patient.                           - Resume previous diet.                           - Continue present medications.                           - Repeat colonoscopy in 10 years for surveillance.                            - Return to GI clinic in 3 months. Napoleon Form, MD 06/27/2023 9:47:45 AM This report has been signed electronically.

## 2023-06-27 NOTE — Progress Notes (Signed)
 [facility] History and Physical   Primary Care Physician:  Olive Bass, FNP   Reason for Procedure:  Colorectal cancer screening  Plan:    Screening colonoscopy with possible interventions as needed     HPI: Tasha Ortiz is a very pleasant 68 y.o. female here for screening colonoscopy. Denies any nausea, vomiting, abdominal pain, melena or bright red blood per rectum  The risks and benefits as well as alternatives of endoscopic procedure(s) have been discussed and reviewed. All questions answered. The patient agrees to proceed.    Past Medical History:  Diagnosis Date   Acid reflux    Allergy    seasonal   Breast cancer (HCC)    right,no iv or blood on right sid, chemo and radiation 2014   DM (diabetes mellitus) (HCC)    Family history of brain cancer    Family history of breast cancer    Heartburn    Hypercholesteremia    taken off chol meds Dec 2012   Hypertension    Personal history of chemotherapy 05/2012   rt breast   Personal history of radiation therapy 10/2012   rt breast    Past Surgical History:  Procedure Laterality Date   BREAST BIOPSY Right 04/16/2012   BREAST SURGERY Right 05/20/12   Rt br mastectomy   CESAREAN SECTION  25 years ago   colonoscopy     COLONOSCOPY WITH PROPOFOL N/A 01/18/2015   Procedure: COLONOSCOPY WITH PROPOFOL;  Surgeon: Napoleon Form, MD;  Location: [facility];  Service: Endoscopy;  Laterality: N/A;   MASTECTOMY Right 05/2012   MODIFIED MASTECTOMY Right 05/20/2012   Procedure: MODIFIED MASTECTOMY;  Surgeon: Mariella Saa, MD;  Location: [facility] OR;  Service: General;  Laterality: Right;   PORTACATH PLACEMENT Left 05/20/2012   Procedure: INSERTION PORT-A-CATH;  Surgeon: Mariella Saa, MD;  Location: [facility] OR;  Service: General;  Laterality: Left;   SUBOCCIPITAL CRANIECTOMY CERVICAL LAMINECTOMY N/A 11/21/2016   Procedure: SUBOCCIPITAL CRANIECTOMY RESECTION TUMOR;  Surgeon: Tressie Stalker,  MD;  Location: [facility] OR;  Service: Neurosurgery;  Laterality: N/A;   TUBAL LIGATION      Prior to Admission medications   Medication Sig Start Date End Date Taking? Authorizing Provider  alendronate (FOSAMAX) 70 MG tablet Take 1 tablet (70 mg total) by mouth every 7 (seven) days. Take with a full glass of water on an empty stomach. 08/09/22  Yes Olive Bass, FNP  amLODipine (NORVASC) 10 MG tablet TAKE 1 TABLET BY MOUTH EVERY DAY 04/23/23  Yes Olive Bass, FNP  anastrozole (ARIMIDEX) 1 MG tablet TAKE 1 TABLET BY MOUTH EVERY DAY 05/01/23  Yes Serena Croissant, MD  blood glucose meter kit and supplies KIT Dispense based on patient and insurance preference. Use up to four times daily as directed. (FOR ICD-9 250.00, 250.01). 05/26/19  Yes Olive Bass, FNP  dorzolamide-timolol (COSOPT) 22.3-6.8 MG/ML ophthalmic solution  09/02/19  Yes [provider]  JARDIANCE 25 MG TABS tablet Take 25 mg by mouth daily.   Yes [provider]  Lancets (ONETOUCH DELICA PLUS LANCET33G) MISC USE TO check blood sugar UP TO 4 TIMES DAILY 12/02/19  Yes [provider]  levothyroxine (SYNTHROID) 50 MCG tablet TAKE 1 TABLET (50 MCG TOTAL) BY MOUTH DAILY BEFORE BREAKFAST. LABS FOR FURTHER REFILLS 06/25/23  Yes Olive Bass, FNP  [facility] ULTRA test strip  07/31/19  Yes [provider]  potassium chloride (KLOR-CON M10) 10 MEQ tablet Take 1 tablet (10 mEq total)  by mouth 2 (two) times daily. 04/10/23  Yes Olive Bass, FNP  pravastatin (PRAVACHOL) 40 MG tablet TAKE 1 TABLET BY MOUTH EVERY DAY 12/18/22  Yes Olive Bass, FNP  sertraline (ZOLOFT) 50 MG tablet Take 1 tablet daily 01/04/23  Yes Olive Bass, FNP  tobramycin (TOBREX) 0.3 % ophthalmic solution Place 1 drop into both eyes 3 (three) times daily. 12/25/22  Yes [provider]  AMBULATORY NON FORMULARY MEDICATION Nitroglycerin ointment 0.125 %  Apply a pea sized amount  internally four times daily. Patient not taking: Reported on 06/27/2023 03/22/23   Napoleon Form, MD  fluticasone (FLONASE) 50 MCG/ACT nasal spray Place 1 spray into both nostrils daily. Patient not taking: Reported on 05/16/2023 02/16/22   Olive Bass, FNP  ondansetron (ZOFRAN) 4 MG tablet Take 1 tablet (4 mg total) by mouth as directed. Take 45 min - 1 hour before drinking each dose of your prep solution Patient not taking: Reported on 06/27/2023 06/06/23   Napoleon Form, MD  Prochlorperazine Maleate (COMPAZINE PO) Take by mouth.  10/07/12  [provider]    Current Outpatient Medications  Medication Sig Dispense Refill   alendronate (FOSAMAX) 70 MG tablet Take 1 tablet (70 mg total) by mouth every 7 (seven) days. Take with a full glass of water on an empty stomach. 4 tablet 11   amLODipine (NORVASC) 10 MG tablet TAKE 1 TABLET BY MOUTH EVERY DAY 90 tablet 3   anastrozole (ARIMIDEX) 1 MG tablet TAKE 1 TABLET BY MOUTH EVERY DAY 90 tablet 2   blood glucose meter kit and supplies KIT Dispense based on patient and insurance preference. Use up to four times daily as directed. (FOR ICD-9 250.00, 250.01). 1 each 0   dorzolamide-timolol (COSOPT) 22.3-6.8 MG/ML ophthalmic solution      JARDIANCE 25 MG TABS tablet Take 25 mg by mouth daily.     Lancets (ONETOUCH DELICA PLUS LANCET33G) MISC USE TO check blood sugar UP TO 4 TIMES DAILY     levothyroxine (SYNTHROID) 50 MCG tablet TAKE 1 TABLET (50 MCG TOTAL) BY MOUTH DAILY BEFORE BREAKFAST. LABS FOR FURTHER REFILLS 90 tablet 1   ONETOUCH ULTRA test strip      potassium chloride (KLOR-CON M10) 10 MEQ tablet Take 1 tablet (10 mEq total) by mouth 2 (two) times daily. 180 tablet 0   pravastatin (PRAVACHOL) 40 MG tablet TAKE 1 TABLET BY MOUTH EVERY DAY 90 tablet 3   sertraline (ZOLOFT) 50 MG tablet Take 1 tablet daily 90 tablet 3   tobramycin (TOBREX) 0.3 % ophthalmic solution Place 1 drop into both eyes 3 (three) times daily.      AMBULATORY NON FORMULARY MEDICATION Nitroglycerin ointment 0.125 %  Apply a pea sized amount internally four times daily. (Patient not taking: Reported on 06/27/2023) 30 g 0   fluticasone (FLONASE) 50 MCG/ACT nasal spray Place 1 spray into both nostrils daily. (Patient not taking: Reported on 05/16/2023) 16 g 3   ondansetron (ZOFRAN) 4 MG tablet Take 1 tablet (4 mg total) by mouth as directed. Take 45 min - 1 hour before drinking each dose of your prep solution (Patient not taking: Reported on 06/27/2023) 3 tablet 0   Current Facility-Administered Medications  Medication Dose Route Frequency Provider Last Rate Last Admin   0.9 %  sodium chloride infusion  500 mL Intravenous Once Napoleon Form, MD        Allergies as of 06/27/2023 - Review Complete 06/27/2023  Allergen Reaction Noted  Lisinopril Swelling 01/25/2015    Family History  Problem Relation Age of Onset   Hypertension Mother    Aneurysm Mother 16       brain   Jaundice Father 71   Brain cancer Sister 84   Diabetes Daughter    Renal Disease Daughter    Hypertension Daughter    Anemia Daughter    Breast cancer Cousin 69   Breast cancer Cousin 55   Breast cancer Cousin 61   Colon cancer Neg Hx     Social History   Socioeconomic History   Marital status: Divorced    Spouse name: Not on file   Number of children: 2   Years of education: 16   Highest education level: Not on file  Occupational History   Occupation: Health visitor  Tobacco Use   Smoking status: Never   Smokeless tobacco: Former    Types: Chew    Quit date: 11/18/1996  Vaping Use   Vaping status: Never Used  Substance and Sexual Activity   Alcohol use: No    Alcohol/week: 0.0 standard drinks of alcohol   Drug use: No   Sexual activity: Never    Comment: menarche 14, P2, no HRT  Other Topics Concern   Not on file  Social History Narrative   Fun: [employer]   Denies religious beliefs effecting health care.    Social Drivers of Manufacturing engineer Strain: Low Risk  (07/13/2022)   Overall Financial Resource Strain (CARDIA)    Difficulty of Paying Living Expenses: Not very hard  Food Insecurity: Low Risk  (06/12/2023)   Received from [facility]   Hunger Vital Sign    Worried About Running Out of Food in the Last Year: Never true    Ran Out of Food in the Last Year: Never true  Transportation Needs: No Transportation Needs (06/12/2023)   Received from [facility]    In the past 12 months, has lack of reliable transportation kept you from medical appointments, meetings, work or from getting things needed for daily living? : No  Physical Activity: Inactive (07/13/2022)   Exercise Vital Sign    Days of Exercise per Week: 0 days    Minutes of Exercise per Session: 0 min  Stress: No Stress Concern Present (07/13/2022)   [employer] of Occupational Health - Occupational Stress Questionnaire    Feeling of Stress : Not at all  Social Connections: Unknown (07/13/2022)   Social Connection and Isolation Panel [NHANES]    Frequency of Communication with Friends and Family: Twice a week    Frequency of Social Gatherings with Friends and Family: Never    Attends Religious Services: Not on Marketing executive or Organizations: Patient declined    Attends Banker Meetings: Never    Marital Status: Divorced  Catering manager Violence: Not At Risk (07/13/2022)   Humiliation, Afraid, Rape, and Kick questionnaire    Fear of Current or Ex-Partner: No    Emotionally Abused: No    Physically Abused: No    Sexually Abused: No    Review of Systems:  All other review of systems negative except as mentioned in the HPI.  Physical Exam: Vital signs in last 24 hours: BP 129/77   Pulse 67   Temp 98.2 F (36.8 C) (Skin)   Ht 5\' 4"  (1.626 m)   Wt 144 lb 3.2 oz (65.4 kg)   LMP 08/16/2012   SpO2  96%   BMI 24.75 kg/m  General:   Alert, NAD Lungs:  Clear .   Heart:  Regular rate  and rhythm Abdomen:  Soft, nontender and nondistended. Neuro/Psych:  Alert and cooperative. Normal mood and affect. A and O x 3  Reviewed labs, radiology imaging, old records and pertinent past GI work up  Patient is appropriate for planned procedure(s) and anesthesia in an ambulatory setting   K. Scherry Ran , MD [phone]

## 2023-06-28 ENCOUNTER — Telehealth: Payer: Self-pay

## 2023-06-28 NOTE — Telephone Encounter (Signed)
Attempted to reach patient for post-procedure f/u call. No answer. Left message for her to please not hesitate to call if she has any questions/concerns regarding her care. 

## 2023-07-09 ENCOUNTER — Other Ambulatory Visit: Payer: Self-pay | Admitting: Family

## 2023-07-11 ENCOUNTER — Ambulatory Visit: Admitting: Physical Therapy

## 2023-07-14 ENCOUNTER — Other Ambulatory Visit: Payer: Self-pay | Admitting: Family
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
## 2023-07-24 ENCOUNTER — Other Ambulatory Visit: Payer: Self-pay

## 2023-07-24 ENCOUNTER — Encounter: Payer: Self-pay | Admitting: Rehabilitation

## 2023-07-24 ENCOUNTER — Ambulatory Visit: Attending: Hematology and Oncology | Admitting: Rehabilitation

## 2023-07-24 DIAGNOSIS — R262 Difficulty in walking, not elsewhere classified: Secondary | ICD-10-CM | POA: Diagnosis present

## 2023-07-24 DIAGNOSIS — R293 Abnormal posture: Secondary | ICD-10-CM | POA: Diagnosis present

## 2023-07-24 DIAGNOSIS — C7931 Secondary malignant neoplasm of brain: Secondary | ICD-10-CM | POA: Diagnosis not present

## 2023-07-24 DIAGNOSIS — C50411 Malignant neoplasm of upper-outer quadrant of right female breast: Secondary | ICD-10-CM | POA: Insufficient documentation

## 2023-07-24 DIAGNOSIS — M6281 Muscle weakness (generalized): Secondary | ICD-10-CM | POA: Diagnosis present

## 2023-07-29 ENCOUNTER — Encounter

## 2023-08-02 ENCOUNTER — Encounter: Payer: Self-pay | Admitting: Family

## 2023-08-02 ENCOUNTER — Telehealth (INDEPENDENT_AMBULATORY_CARE_PROVIDER_SITE_OTHER): Admitting: Family

## 2023-08-02 ENCOUNTER — Other Ambulatory Visit: Payer: Self-pay | Admitting: Family

## 2023-08-02 VITALS — Ht 64.0 in

## 2023-08-02 DIAGNOSIS — E1169 Type 2 diabetes mellitus with other specified complication: Secondary | ICD-10-CM

## 2023-08-02 DIAGNOSIS — I10 Essential (primary) hypertension: Secondary | ICD-10-CM

## 2023-08-02 DIAGNOSIS — E782 Mixed hyperlipidemia: Secondary | ICD-10-CM

## 2023-08-02 DIAGNOSIS — E039 Hypothyroidism, unspecified: Secondary | ICD-10-CM

## 2023-08-02 DIAGNOSIS — E559 Vitamin D deficiency, unspecified: Secondary | ICD-10-CM
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
## 2023-08-29 ENCOUNTER — Encounter: Payer: Self-pay | Admitting: Family

## 2023-09-10 ENCOUNTER — Encounter: Payer: Self-pay | Admitting: Rehabilitation

## 2023-09-10 ENCOUNTER — Ambulatory Visit: Attending: Hematology and Oncology | Admitting: Rehabilitation

## 2023-09-10 DIAGNOSIS — R262 Difficulty in walking, not elsewhere classified: Secondary | ICD-10-CM | POA: Insufficient documentation

## 2023-09-10 DIAGNOSIS — R293 Abnormal posture: Secondary | ICD-10-CM | POA: Diagnosis present

## 2023-09-10 DIAGNOSIS — M6281 Muscle weakness (generalized): Secondary | ICD-10-CM | POA: Insufficient documentation

## 2023-09-10 DIAGNOSIS — C50411 Malignant neoplasm of upper-outer quadrant of right female breast: Secondary | ICD-10-CM | POA: Diagnosis present
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
## 2023-09-12 ENCOUNTER — Encounter: Payer: Self-pay | Admitting: Rehabilitation

## 2023-09-12 ENCOUNTER — Ambulatory Visit: Admitting: Rehabilitation

## 2023-09-12 DIAGNOSIS — R262 Difficulty in walking, not elsewhere classified: Secondary | ICD-10-CM

## 2023-09-12 DIAGNOSIS — R293 Abnormal posture: Secondary | ICD-10-CM

## 2023-09-12 DIAGNOSIS — M6281 Muscle weakness (generalized): Secondary | ICD-10-CM

## 2023-09-12 DIAGNOSIS — C50411 Malignant neoplasm of upper-outer quadrant of right female breast: Secondary | ICD-10-CM
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
## 2023-09-17 ENCOUNTER — Ambulatory Visit

## 2023-09-17 DIAGNOSIS — R262 Difficulty in walking, not elsewhere classified: Secondary | ICD-10-CM

## 2023-09-17 DIAGNOSIS — R293 Abnormal posture: Secondary | ICD-10-CM

## 2023-09-17 DIAGNOSIS — C50411 Malignant neoplasm of upper-outer quadrant of right female breast: Secondary | ICD-10-CM

## 2023-09-17 DIAGNOSIS — M6281 Muscle weakness (generalized): Secondary | ICD-10-CM
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
## 2023-09-26 ENCOUNTER — Encounter: Payer: Self-pay | Admitting: Rehabilitation

## 2023-09-26 ENCOUNTER — Ambulatory Visit: Admitting: Rehabilitation

## 2023-09-26 ENCOUNTER — Encounter: Payer: Self-pay | Admitting: Hematology and Oncology

## 2023-09-26 DIAGNOSIS — C50411 Malignant neoplasm of upper-outer quadrant of right female breast: Secondary | ICD-10-CM

## 2023-09-26 DIAGNOSIS — R262 Difficulty in walking, not elsewhere classified: Secondary | ICD-10-CM

## 2023-09-26 DIAGNOSIS — M6281 Muscle weakness (generalized): Secondary | ICD-10-CM

## 2023-09-26 DIAGNOSIS — R293 Abnormal posture: Secondary | ICD-10-CM
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
## 2023-10-01 ENCOUNTER — Ambulatory Visit: Admitting: Rehabilitation

## 2023-10-03 ENCOUNTER — Ambulatory Visit: Admitting: Rehabilitation

## 2023-10-10 ENCOUNTER — Ambulatory Visit: Attending: Hematology and Oncology

## 2023-10-10 DIAGNOSIS — M6281 Muscle weakness (generalized): Secondary | ICD-10-CM | POA: Insufficient documentation

## 2023-10-10 DIAGNOSIS — R293 Abnormal posture: Secondary | ICD-10-CM | POA: Diagnosis present

## 2023-10-10 DIAGNOSIS — C50411 Malignant neoplasm of upper-outer quadrant of right female breast: Secondary | ICD-10-CM | POA: Insufficient documentation

## 2023-10-10 DIAGNOSIS — R262 Difficulty in walking, not elsewhere classified: Secondary | ICD-10-CM | POA: Insufficient documentation
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
## 2023-10-12 ENCOUNTER — Other Ambulatory Visit: Payer: Self-pay | Admitting: Family

## 2023-10-14 ENCOUNTER — Ambulatory Visit: Admitting: Gastroenterology

## 2023-10-14 NOTE — Addendum Note (Signed)
 Addended by: LARUE SADDIE SAUNDERS on: 10/14/2023 10:58 AM   Modules accepted: Orders

## 2023-10-15 ENCOUNTER — Ambulatory Visit

## 2023-10-15 DIAGNOSIS — M6281 Muscle weakness (generalized): Secondary | ICD-10-CM | POA: Diagnosis not present

## 2023-10-15 DIAGNOSIS — C50411 Malignant neoplasm of upper-outer quadrant of right female breast: Secondary | ICD-10-CM

## 2023-10-15 DIAGNOSIS — R262 Difficulty in walking, not elsewhere classified: Secondary | ICD-10-CM

## 2023-10-15 DIAGNOSIS — R293 Abnormal posture: Secondary | ICD-10-CM
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
## 2023-10-22 ENCOUNTER — Ambulatory Visit

## 2023-10-24 ENCOUNTER — Ambulatory Visit

## 2023-10-24 DIAGNOSIS — M6281 Muscle weakness (generalized): Secondary | ICD-10-CM

## 2023-10-24 DIAGNOSIS — C50411 Malignant neoplasm of upper-outer quadrant of right female breast: Secondary | ICD-10-CM

## 2023-10-24 DIAGNOSIS — R293 Abnormal posture: Secondary | ICD-10-CM

## 2023-10-24 DIAGNOSIS — R262 Difficulty in walking, not elsewhere classified: Secondary | ICD-10-CM
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
## 2023-11-12 ENCOUNTER — Ambulatory Visit: Attending: Hematology and Oncology

## 2023-11-12 DIAGNOSIS — R262 Difficulty in walking, not elsewhere classified: Secondary | ICD-10-CM | POA: Diagnosis present

## 2023-11-12 DIAGNOSIS — R293 Abnormal posture: Secondary | ICD-10-CM | POA: Diagnosis present

## 2023-11-12 DIAGNOSIS — M6281 Muscle weakness (generalized): Secondary | ICD-10-CM | POA: Insufficient documentation

## 2023-11-12 DIAGNOSIS — C50411 Malignant neoplasm of upper-outer quadrant of right female breast: Secondary | ICD-10-CM | POA: Diagnosis present
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
## 2023-11-14 ENCOUNTER — Ambulatory Visit

## 2023-11-14 ENCOUNTER — Encounter: Payer: Self-pay | Admitting: Family

## 2023-11-14 DIAGNOSIS — R262 Difficulty in walking, not elsewhere classified: Secondary | ICD-10-CM

## 2023-11-14 DIAGNOSIS — C50411 Malignant neoplasm of upper-outer quadrant of right female breast: Secondary | ICD-10-CM

## 2023-11-14 DIAGNOSIS — M6281 Muscle weakness (generalized): Secondary | ICD-10-CM

## 2023-11-14 DIAGNOSIS — R293 Abnormal posture: Secondary | ICD-10-CM
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
## 2023-11-15 ENCOUNTER — Other Ambulatory Visit: Payer: Self-pay | Admitting: Family

## 2023-11-15 MED ORDER — POTASSIUM CHLORIDE CRYS ER 10 MEQ PO TBCR: 10.0000 meq | EXTENDED_RELEASE_TABLET | Freq: Two times a day (BID) | ORAL | 1 refills | Status: DC

## 2023-11-20 ENCOUNTER — Encounter: Payer: Self-pay | Admitting: Hematology and Oncology

## 2023-11-22 ENCOUNTER — Ambulatory Visit: Admitting: Family

## 2023-11-22 ENCOUNTER — Ambulatory Visit: Payer: Self-pay | Admitting: Family

## 2023-11-22 ENCOUNTER — Encounter: Payer: Self-pay | Admitting: Family

## 2023-11-22 ENCOUNTER — Other Ambulatory Visit: Payer: Self-pay | Admitting: Family

## 2023-11-22 VITALS — BP 134/82 | HR 60 | Ht 64.0 in | Wt 151.2 lb

## 2023-11-22 DIAGNOSIS — E039 Hypothyroidism, unspecified: Secondary | ICD-10-CM

## 2023-11-22 DIAGNOSIS — E782 Mixed hyperlipidemia: Secondary | ICD-10-CM | POA: Diagnosis not present

## 2023-11-22 DIAGNOSIS — I10 Essential (primary) hypertension: Secondary | ICD-10-CM

## 2023-11-22 DIAGNOSIS — E559 Vitamin D deficiency, unspecified: Secondary | ICD-10-CM | POA: Diagnosis not present

## 2023-11-22 LAB — COMPREHENSIVE METABOLIC PANEL WITH GFR
ALT: 13 U/L (ref 0–35)
AST: 15 U/L (ref 0–37)
Albumin: 4.3 g/dL (ref 3.5–5.2)
Alkaline Phosphatase: 49 U/L (ref 39–117)
BUN: 15 mg/dL (ref 6–23)
CO2: 27 meq/L (ref 19–32)
Calcium: 9.4 mg/dL (ref 8.4–10.5)
Chloride: 106 meq/L (ref 96–112)
Creatinine, Ser: 0.74 mg/dL (ref 0.40–1.20)
GFR: 84.53 mL/min (ref >60.00)
Glucose, Bld: 152 mg/dL — ABNORMAL HIGH (ref 70–99)
Potassium: 3.5 meq/L (ref 3.5–5.1)
Sodium: 142 meq/L (ref 135–145)
Total Bilirubin: 0.4 mg/dL (ref 0.2–1.2)
Total Protein: 7.4 g/dL (ref 6.0–8.3)

## 2023-11-22 LAB — LIPID PANEL
Cholesterol: 193 mg/dL (ref 0–200)
HDL: 52.1 mg/dL (ref >39.00)
LDL Cholesterol: 131 mg/dL — ABNORMAL HIGH (ref 0–99)
NonHDL: 140.94
Total CHOL/HDL Ratio: 4
Triglycerides: 50 mg/dL (ref 0.0–149.0)
VLDL: 10 mg/dL (ref 0.0–40.0)

## 2023-11-22 LAB — CBC WITH DIFFERENTIAL/PLATELET
Basophils Absolute: 0 K/uL (ref 0.0–0.1)
Basophils Relative: 0.8 % (ref 0.0–3.0)
Eosinophils Absolute: 0.2 K/uL (ref 0.0–0.7)
Eosinophils Relative: 4.5 % (ref 0.0–5.0)
HCT: 42.1 % (ref 36.0–46.0)
Hemoglobin: 13.9 g/dL (ref 12.0–15.0)
Lymphocytes Relative: 38.7 % (ref 12.0–46.0)
Lymphs Abs: 1.9 K/uL (ref 0.7–4.0)
MCHC: 33.1 g/dL (ref 30.0–36.0)
MCV: 87.8 fl (ref 78.0–100.0)
Monocytes Absolute: 0.5 K/uL (ref 0.1–1.0)
Monocytes Relative: 9.3 % (ref 3.0–12.0)
Neutro Abs: 2.3 K/uL (ref 1.4–7.7)
Neutrophils Relative %: 46.7 % (ref 43.0–77.0)
Platelets: 232 K/uL (ref 150.0–400.0)
RBC: 4.8 Mil/uL (ref 3.87–5.11)
RDW: 13.8 % (ref 11.5–15.5)
WBC: 5 K/uL (ref 4.0–10.5)

## 2023-11-22 LAB — TSH: TSH: 3.6 u[IU]/mL (ref 0.35–5.50)

## 2023-11-22 LAB — VITAMIN D 25 HYDROXY (VIT D DEFICIENCY, FRACTURES): VITD: 46.11 ng/mL (ref 30.00–100.00)

## 2023-11-22 MED ORDER — SERTRALINE HCL 50 MG PO TABS: ORAL_TABLET | ORAL | 3 refills | Status: AC

## 2023-11-22 MED ORDER — LEVOTHYROXINE SODIUM 50 MCG PO TABS: 50.0000 ug | ORAL_TABLET | Freq: Every day | ORAL | 3 refills | Status: AC

## 2023-11-22 MED ORDER — PRAVASTATIN SODIUM 40 MG PO TABS: 40.0000 mg | ORAL_TABLET | Freq: Every day | ORAL | 3 refills | Status: AC

## 2023-11-22 NOTE — Patient Instructions (Signed)
 As we discussed, you can transfer back to [facility] or [facility] Pereyra;
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
## 2023-12-05 ENCOUNTER — Encounter: Payer: Self-pay | Admitting: Family

## 2023-12-11 ENCOUNTER — Ambulatory Visit (INDEPENDENT_AMBULATORY_CARE_PROVIDER_SITE_OTHER)

## 2023-12-11 VITALS — BP 134/82 | Ht 64.0 in | Wt 154.0 lb

## 2023-12-11 DIAGNOSIS — Z0000 Encounter for general adult medical examination without abnormal findings: Secondary | ICD-10-CM | POA: Diagnosis not present

## 2023-12-11 NOTE — Patient Instructions (Signed)
 Tasha Ortiz,  Thank you for taking the time for your Medicare Wellness Visit. I appreciate your continued commitment to your health goals. Please review the care plan we discussed, and feel free to reach out if I can assist you further.  Medicare recommends these wellness visits once per year to help you and your care team stay ahead of potential health issues. These visits are designed to focus on prevention, allowing your provider to concentrate on managing your acute and chronic conditions during your regular appointments.  Please note that Annual Wellness Visits do not include a physical exam. Some assessments may be limited, especially if the visit was conducted virtually. If needed, we may recommend a separate in-person follow-up with your provider.  Ongoing Care Seeing your primary care provider every 3 to 6 months helps us  monitor your health and provide consistent, personalized care.   Referrals If a referral was made during today's visit and you haven't received any updates within two weeks, please contact the referred provider directly to check on the status.  Recommended Screenings:  Health Maintenance  Topic Date Due   Eye exam for diabetics  Never done   Yearly kidney health urinalysis for diabetes  Never done   Hepatitis C Screening  Never done   DTaP/Tdap/Td vaccine (1 - Tdap) Never done   Hemoglobin A1C  08/17/2022   Complete foot exam   10/10/2022   Flu Shot  11/01/2023   COVID-19 Vaccine (4 - 2025-26 season) 12/02/2023   Mammogram  08/07/2024   Yearly kidney function blood test for diabetes  11/21/2024   Medicare Annual Wellness Visit  12/10/2024   Colon Cancer Screening  06/26/2033   Pneumococcal Vaccine for age over 31  Completed   DEXA scan (bone density measurement)  Completed   Zoster (Shingles) Vaccine  Completed   HPV Vaccine  Aged Out   Meningitis B Vaccine  Aged Out       12/11/2023   10:48 AM  Advanced Directives  Does Patient Have a Medical  Advance Directive? Yes  Type of Estate agent of [place];Living will  Does patient want to make changes to medical advance directive? No - Patient declined  Copy of Healthcare Power of Attorney in Chart? Yes - validated most recent copy scanned in chart (See row information)   Advance Care Planning is important because it: Ensures you receive medical care that aligns with your values, goals, and preferences. Provides guidance to your family and loved ones, reducing the emotional burden of decision-making during critical moments.  Vision: Annual vision screenings are recommended for early detection of glaucoma, cataracts, and diabetic retinopathy. These exams can also reveal signs of chronic conditions such as diabetes and high blood pressure.  Dental: Annual dental screenings help detect early signs of oral cancer, gum disease, and other conditions linked to overall health, including heart disease and diabetes.  Please see the attached documents for additional preventive care recommendations.

## 2023-12-11 NOTE — Progress Notes (Signed)
 Because this visit was a virtual/telehealth visit,  certain criteria was not obtained, such a blood pressure, CBG if applicable, and timed get up and go. Any medications not marked as taking were not mentioned during the medication reconciliation part of the visit. Any vitals not documented were not able to be obtained due to this being a telehealth visit or patient was unable to self-report a recent blood pressure reading due to a lack of equipment at home via telehealth. Vitals that have been documented are verbally provided by the patient.   This visit was performed by a medical professional under my direct supervision. I was immediately available for consultation/collaboration. I have reviewed and agree with the Annual Wellness Visit documentation.  Subjective:   Tasha Ortiz is a 68 y.o. who presents for a Medicare Wellness preventive visit.  As a reminder, Annual Wellness Visits don't include a physical exam, and some assessments may be limited, especially if this visit is performed virtually. We may recommend an in-person follow-up visit with your provider if needed.  Visit Complete: Virtual I connected with  Tasha Ortiz on 12/11/23 by a audio enabled telemedicine application and verified that I am speaking with the correct person using two identifiers.  Patient Location: Home  Provider Location: Home Office  I discussed the limitations of evaluation and management by telemedicine. The patient expressed understanding and agreed to proceed.  Vital Signs: Because this visit was a virtual/telehealth visit, some criteria may be missing or patient reported. Any vitals not documented were not able to be obtained and vitals that have been documented are patient reported.  VideoDeclined- This patient declined Librarian, academic. Therefore the visit was completed with audio only.  Persons Participating in Visit: Patient.  AWV Questionnaire: Yes:  Patient Medicare AWV questionnaire was completed by the patient on 12/04/2023; I have confirmed that all information answered by patient is correct and no changes since this date.  Cardiac Risk Factors include: advanced age (>73men, >20 women);diabetes mellitus;hypertension;dyslipidemia     Objective:    Today's Vitals   12/11/23 1048  BP: 134/82  Weight: 154 lb (69.9 kg)  Height: 5' 4 (1.626 m)   Body mass index is 26.43 kg/m.     12/11/2023   10:48 AM 07/24/2023    1:04 PM 07/13/2022    3:41 PM 06/11/2022    8:28 AM 03/19/2022    9:10 AM 07/13/2021   11:08 AM 07/11/2021    2:14 PM  Advanced Directives  Does Patient Have a Medical Advance Directive? Yes No Yes No No No Yes  Type of Estate agent of [place];Living will  Healthcare Power of [place];Living will    Healthcare Power of Attorney  Does patient want to make changes to medical advance directive? No - Patient declined  No - Patient declined   Yes (MAU/Ambulatory/Procedural Areas - Information given)   Copy of Healthcare Power of Attorney in Chart? Yes - validated most recent copy scanned in chart (See row information)  Yes - validated most recent copy scanned in chart (See row information)    No - copy requested  Would patient like information on creating a medical advance directive?  No - Patient declined  No - Patient declined No - Patient declined      Current Medications (verified) Outpatient Encounter Medications as of 12/11/2023  Medication Sig   alendronate  (FOSAMAX ) 70 MG tablet TAKE 1 TABLET (70 MG TOTAL) BY MOUTH EVERY 7 DAYS WITH FULL GLASS WATER  ON EMPTY STOMACH   AMBULATORY NON FORMULARY MEDICATION Nitroglycerin ointment 0.125 %  Apply a pea sized amount internally four times daily.   amLODipine  (NORVASC ) 10 MG tablet TAKE 1 TABLET BY MOUTH EVERY DAY   anastrozole  (ARIMIDEX ) 1 MG tablet TAKE 1 TABLET BY MOUTH EVERY DAY   blood glucose meter kit and supplies KIT Dispense based on patient  and insurance preference. Use up to four times daily as directed. (FOR ICD-9 250.00, 250.01).   dorzolamide-timolol (COSOPT) 22.3-6.8 MG/ML ophthalmic solution    fluticasone  (FLONASE ) 50 MCG/ACT nasal spray Place 1 spray into both nostrils daily.   JARDIANCE 25 MG TABS tablet Take 25 mg by mouth daily.   Lancets (ONETOUCH DELICA PLUS LANCET33G) MISC USE TO check blood sugar UP TO 4 TIMES DAILY   levothyroxine  (SYNTHROID ) 50 MCG tablet Take 1 tablet (50 mcg total) by mouth daily before breakfast.   ondansetron  (ZOFRAN ) 4 MG tablet Take 1 tablet (4 mg total) by mouth as directed. Take 45 min - 1 hour before drinking each dose of your prep solution   ONETOUCH ULTRA test strip    potassium chloride  (KLOR-CON  M) 10 MEQ tablet Take 1 tablet (10 mEq total) by mouth 2 (two) times daily.   pravastatin  (PRAVACHOL ) 40 MG tablet Take 1 tablet (40 mg total) by mouth daily.   sertraline  (ZOLOFT ) 50 MG tablet Take 1 tablet daily   tobramycin (TOBREX) 0.3 % ophthalmic solution Place 1 drop into both eyes 3 (three) times daily.   [DISCONTINUED] Prochlorperazine  Maleate (COMPAZINE  PO) Take by mouth.   No facility-administered encounter medications on file as of 12/11/2023.    Allergies (verified) Lisinopril    History: Past Medical History:  Diagnosis Date   Acid reflux    Allergy    seasonal   Breast cancer (HCC)    right,no iv or blood on right sid, chemo and radiation 2014   CVA (cerebral vascular accident) (HCC)    cva during surgery 2018   DM (diabetes mellitus) (HCC)    Family history of brain cancer    Family history of breast cancer    Heartburn    Hypercholesteremia    taken off chol meds Dec 2012   Hypertension    Personal history of chemotherapy 05/2012   rt breast   Personal history of radiation therapy 10/2012   rt breast   Past Surgical History:  Procedure Laterality Date   BREAST BIOPSY Right 04/16/2012   BREAST SURGERY Right 05/20/12   Rt br mastectomy   CESAREAN SECTION   25 years ago   colonoscopy     COLONOSCOPY WITH PROPOFOL  N/A 01/18/2015   Procedure: COLONOSCOPY WITH PROPOFOL ;  Surgeon: Gustav Shila GAILS, MD;  Location: [facility];  Service: Endoscopy;  Laterality: N/A;   MASTECTOMY Right 05/2012   MODIFIED MASTECTOMY Right 05/20/2012   Procedure: MODIFIED MASTECTOMY;  Surgeon: Morene ONEIDA Olives, MD;  Location: [facility] OR;  Service: General;  Laterality: Right;   PORTACATH PLACEMENT Left 05/20/2012   Procedure: INSERTION PORT-A-CATH;  Surgeon: Morene ONEIDA Olives, MD;  Location: [facility] OR;  Service: General;  Laterality: Left;   SUBOCCIPITAL CRANIECTOMY CERVICAL LAMINECTOMY N/A 11/21/2016   Procedure: SUBOCCIPITAL CRANIECTOMY RESECTION TUMOR;  Surgeon: Mavis Purchase, MD;  Location[facility];  Service: Neurosurgery;  Laterality: N/A;   TUBAL LIGATION     Family History  Problem Relation Age of Onset   Hypertension Mother    Aneurysm Mother 4       brain   Jaundice Father 78  Brain cancer Sister 65   Diabetes Daughter    Renal Disease Daughter    Hypertension Daughter    Anemia Daughter    Breast cancer Cousin 39   Breast cancer Cousin 20   Breast cancer Cousin 18   Colon cancer Neg Hx    Social History   Socioeconomic History   Marital status: Divorced    Spouse name: Not on file   Number of children: 2   Years of education: 16   Highest education level: Bachelor's degree (e.g., BA, AB, BS)  Occupational History   Occupation: Health visitor  Tobacco Use   Smoking status: Never   Smokeless tobacco: Former    Types: Chew    Quit date: 11/18/1996  Vaping Use   Vaping status: Never Used  Substance and Sexual Activity   Alcohol use: No    Alcohol/week: 0.0 standard drinks of alcohol   Drug use: No   Sexual activity: Never    Comment: menarche 14, P2, no HRT  Other Topics Concern   Not on file  Social History Narrative   Fun: [place]   Denies religious beliefs effecting health care.    Social Drivers of Corporate investment banker  Strain: Low Risk  (12/11/2023)   Overall Financial Resource Strain (CARDIA)    Difficulty of Paying Living Expenses: Not very hard  Food Insecurity: Food Insecurity Present (12/11/2023)   Hunger Vital Sign    Worried About Running Out of Food in the Last Year: Often true    Ran Out of Food in the Last Year: Often true  Transportation Needs: Unmet Transportation Needs (12/11/2023)   PRAPARE - Administrator, Civil Service (Medical): No    Lack of Transportation (Non-Medical): Yes  Physical Activity: Insufficiently Active (12/11/2023)   Exercise Vital Sign    Days of Exercise per Week: 2 days    Minutes of Exercise per Session: 10 min  Stress: No Stress Concern Present (12/11/2023)   [employer] of Occupational Health - Occupational Stress Questionnaire    Feeling of Stress: Not at all  Social Connections: Moderately Integrated (12/11/2023)   Social Connection and Isolation Panel    Frequency of Communication with Friends and Family: Three times a week    Frequency of Social Gatherings with Friends and Family: Once a week    Attends Religious Services: More than 4 times per year    Active Member of [employer] or Organizations: Yes    Attends Banker Meetings: 1 to 4 times per year    Marital Status: Divorced    Tobacco Counseling Counseling given: Not Answered    Clinical Intake:  Pre-visit preparation completed: Yes  Pain : No/denies pain     BMI - recorded: 26.43 Nutritional Status: BMI 25 -29 Overweight Nutritional Risks: None Diabetes: Yes CBG done?: No Did pt. bring in CBG monitor from home?: No  Lab Results  Component Value Date   HGBA1C 7.5 (H) 02/16/2022   HGBA1C 6.0 (H) 11/20/2019   HGBA1C 16.2 Repeated and verified X2. (H) 05/26/2019     How often do you need to have someone help you when you read instructions, pamphlets, or other written materials from your doctor or pharmacy?: 1 - Never  Interpreter Needed?: No  Information  entered by :: Hosea Hanawalt,CMA   Activities of Daily Living     12/04/2023   12:43 PM 07/25/2023    2:30 PM  In your present state of health, do you have  any difficulty performing the following activities:  Hearing? 0 0  Vision? 0 0  Difficulty concentrating or making decisions? 0 0  Walking or climbing stairs? 1 1  Dressing or bathing? 0 0  Doing errands, shopping? 1 1  Preparing Food and eating ? N N  Using the Toilet? N N  In the past six months, have you accidently leaked urine? Y N  Do you have problems with loss of bowel control? N Y  Managing your Medications? N N  Managing your Finances? N N  Housekeeping or managing your Housekeeping? N N    Patient Care Team: Jason Leita Repine, FNP as PCP - General (Internal Medicine)  I have updated your Care Teams any recent Medical Services you may have received from other providers in the past year.     Assessment:   This is a routine wellness examination for [place].  Hearing/Vision screen Hearing Screening - Comments:: Patient wears hearing aids  Vision Screening - Comments:: Patient declined    Goals Addressed             This Visit's Progress    Patient Stated   On track    To walk without walker and better balance        Depression Screen     12/11/2023   10:52 AM 11/22/2023    8:31 AM 08/02/2023    1:59 PM 07/13/2022    3:46 PM 02/16/2022   10:26 AM 07/11/2021    2:13 PM 01/03/2021   10:41 AM  PHQ 2/9 Scores  PHQ - 2 Score 0 0 0 0 0 0 0  PHQ- 9 Score 0 0         Fall Risk     12/04/2023   12:43 PM 11/22/2023    8:31 AM 07/25/2023    2:30 PM 07/13/2022    3:39 PM 07/06/2022   12:14 PM  Fall Risk   Falls in the past year? 0 0 0 0 0  Number falls in past yr: 0 0 0 0   Injury with Fall? 0 0 0 0 0  Risk for fall due to : No Fall Risks No Fall Risks  No Fall Risks   Follow up Falls evaluation completed;Education provided;Falls prevention discussed Falls evaluation completed  Falls prevention discussed      MEDICARE RISK AT HOME:  Medicare Risk at Home Any stairs in or around the home?: (Patient-Rptd) Yes If so, are there any without handrails?: (Patient-Rptd) No Home free of loose throw rugs in walkways, pet beds, electrical cords, etc?: (Patient-Rptd) No Adequate lighting in your home to reduce risk of falls?: (Patient-Rptd) Yes Life alert?: (Patient-Rptd) No Use of a cane, walker or w/c?: (Patient-Rptd) Yes Grab bars in the bathroom?: (Patient-Rptd) No Shower chair or bench in shower?: (Patient-Rptd) Yes Elevated toilet seat or a handicapped toilet?: (Patient-Rptd) Yes  TIMED UP AND GO:  Was the test performed?  No  Cognitive Function: 6CIT completed        12/11/2023   10:52 AM 07/13/2022    3:43 PM 07/11/2021    2:16 PM  6CIT Screen  What Year? 0 points 0 points 0 points  What month? 0 points 0 points 0 points  What time? 0 points 0 points 0 points  Count back from 20 0 points 0 points 0 points  Months in reverse 0 points 0 points 0 points  Repeat phrase 0 points 2 points 0 points  Total Score 0 points 2 points 0  points    Immunizations Immunization History  Administered Date(s) Administered   Fluad Quad(high Dose 65+) 01/03/2021   Fluzone Influenza virus vaccine,trivalent (IIV3), split virus 03/02/2009, 02/23/2015, 01/31/2016, 01/14/2018   Influenza Whole 03/02/2009   Influenza,inj,Quad PF,6+ Mos 02/23/2015, 01/31/2016, 01/14/2018, 12/21/2019   PNEUMOCOCCAL CONJUGATE-20 01/03/2021    Screening Tests Health Maintenance  Topic Date Due   OPHTHALMOLOGY EXAM  Never done   Diabetic kidney evaluation - Urine ACR  Never done   Hepatitis C Screening  Never done   DTaP/Tdap/Td (1 - Tdap) Never done   HEMOGLOBIN A1C  08/17/2022   FOOT EXAM  10/10/2022   Influenza Vaccine  11/01/2023   COVID-19 Vaccine (4 - 2025-26 season) 12/02/2023   MAMMOGRAM  08/07/2024   Diabetic kidney evaluation - eGFR measurement  11/21/2024   Medicare Annual Wellness (AWV)  12/10/2024    Colonoscopy  06/26/2033   Pneumococcal Vaccine: 50+ Years  Completed   DEXA SCAN  Completed   Zoster Vaccines- Shingrix  Completed   HPV VACCINES  Aged Out   Meningococcal B Vaccine  Aged Out    Health Maintenance Items Addressed:patient declined   Additional Screening:  Vision Screening: Recommended annual ophthalmology exams for early detection of glaucoma and other disorders of the eye. Is the patient up to date with their annual eye exam?  No  Who is the provider or what is the name of the office in which the patient attends annual eye exams?   Dental Screening: Recommended annual dental exams for proper oral hygiene  Community Resource Referral / Chronic Care Management: CRR required this visit?  No   CCM required this visit?  No   Plan:    I have personally reviewed and noted the following in the patient's chart:   Medical and social history Use of alcohol, tobacco or illicit drugs  Current medications and supplements including opioid prescriptions. Patient is not currently taking opioid prescriptions. Functional ability and status Nutritional status Physical activity Advanced directives List of other physicians Hospitalizations, surgeries, and ER visits in previous 12 months Vitals Screenings to include cognitive, depression, and falls Referrals and appointments  In addition, I have reviewed and discussed with patient certain preventive protocols, quality metrics, and best practice recommendations. A written personalized care plan for preventive services as well as general preventive health recommendations were provided to patient.   Lyle MARLA Right, [place]   12/11/2023   After Visit Summary: (MyChart) Due to this being a telephonic visit, the after visit summary with patients personalized plan was offered to patient via MyChart   Notes: Nothing significant to report at this time.

## 2023-12-17 DIAGNOSIS — E1165 Type 2 diabetes mellitus with hyperglycemia: Secondary | ICD-10-CM | POA: Diagnosis not present

## 2023-12-24 DIAGNOSIS — R339 Retention of urine, unspecified: Secondary | ICD-10-CM | POA: Diagnosis not present

## 2023-12-25 DIAGNOSIS — H43813 Vitreous degeneration, bilateral: Secondary | ICD-10-CM | POA: Diagnosis not present

## 2023-12-25 DIAGNOSIS — H34813 Central retinal vein occlusion, bilateral, with macular edema: Secondary | ICD-10-CM | POA: Diagnosis not present

## 2023-12-25 DIAGNOSIS — H40043 Steroid responder, bilateral: Secondary | ICD-10-CM | POA: Diagnosis not present

## 2023-12-25 DIAGNOSIS — H3582 Retinal ischemia: Secondary | ICD-10-CM | POA: Diagnosis not present

## 2023-12-25 DIAGNOSIS — H35372 Puckering of macula, left eye: Secondary | ICD-10-CM | POA: Diagnosis not present

## 2023-12-25 DIAGNOSIS — H31092 Other chorioretinal scars, left eye: Secondary | ICD-10-CM | POA: Diagnosis not present

## 2023-12-26 ENCOUNTER — Encounter: Payer: Self-pay | Admitting: Hematology and Oncology

## 2024-01-08 ENCOUNTER — Other Ambulatory Visit: Payer: Self-pay | Admitting: Hematology and Oncology

## 2024-01-09 ENCOUNTER — Encounter: Payer: Self-pay | Admitting: Hematology and Oncology

## 2024-01-12 ENCOUNTER — Encounter: Payer: Self-pay | Admitting: Hematology and Oncology

## 2024-03-03 ENCOUNTER — Encounter: Payer: Self-pay | Admitting: Internal Medicine

## 2024-04-07 ENCOUNTER — Encounter: Payer: Self-pay | Admitting: Hematology and Oncology

## 2024-04-11 ENCOUNTER — Encounter: Payer: Self-pay | Admitting: Family Medicine

## 2024-04-22 ENCOUNTER — Encounter: Payer: Self-pay | Admitting: Family Medicine

## 2024-04-22 ENCOUNTER — Ambulatory Visit: Admitting: Family Medicine

## 2024-04-22 ENCOUNTER — Encounter: Payer: Self-pay | Admitting: Hematology and Oncology

## 2024-04-22 VITALS — BP 102/70 | HR 64 | Temp 97.6°F | Ht 64.0 in | Wt 147.0 lb

## 2024-04-22 DIAGNOSIS — M810 Age-related osteoporosis without current pathological fracture: Secondary | ICD-10-CM | POA: Diagnosis not present

## 2024-04-22 DIAGNOSIS — E782 Mixed hyperlipidemia: Secondary | ICD-10-CM | POA: Diagnosis not present

## 2024-04-22 DIAGNOSIS — N393 Stress incontinence (female) (male): Secondary | ICD-10-CM

## 2024-04-22 DIAGNOSIS — Z95828 Presence of other vascular implants and grafts: Secondary | ICD-10-CM | POA: Diagnosis not present

## 2024-04-22 DIAGNOSIS — F325 Major depressive disorder, single episode, in full remission: Secondary | ICD-10-CM | POA: Diagnosis not present

## 2024-04-22 DIAGNOSIS — R2689 Other abnormalities of gait and mobility: Secondary | ICD-10-CM

## 2024-04-22 DIAGNOSIS — I10 Essential (primary) hypertension: Secondary | ICD-10-CM

## 2024-04-22 DIAGNOSIS — I700 Atherosclerosis of aorta: Secondary | ICD-10-CM | POA: Diagnosis not present

## 2024-04-22 DIAGNOSIS — K760 Fatty (change of) liver, not elsewhere classified: Secondary | ICD-10-CM

## 2024-04-22 NOTE — Patient Instructions (Signed)
 Thank you for trusting us  with your healthcare.   I would like to check fasting labs at your follow up visit unless you get your thyroid  and cholesterol checked at another office before I see you.

## 2024-04-22 NOTE — Progress Notes (Signed)
 "  New Patient Office Visit  Subjective    Patient ID: Tasha Ortiz, female    DOB: 08-28-1955  Age: 69 y.o. MRN: [id number]  CC:  Chief Complaint  Patient presents with   Establish Care    Discussed the use of AI scribe software for clinical note transcription with the patient, who gave verbal consent to proceed.  History of Present Illness Tasha Ortiz is a 69 year old female who presents to establish care.  Glycemic control  - managed by [facility] - Diabetes mellitus diagnosed in 2021 - Hemoglobin A1c improved from 16 to 6.7 - Uses continuous glucose monitor - Receives regular eye exams - Undergoes intravitreal injections every 6 to 8 weeks   Hypertension and medication intolerance - Hypertension managed with amlodipine  - History of angioedema with lisinopril  - No other antihypertensive medications currently used  Hyperlipidemia - Hyperlipidemia managed with pravastatin  - LDL cholesterol 131 mg/dL in August of last year - Family history of hyperlipidemia and hypertension  History of malignancy - Breast cancer diagnosed in 2014, treated with mastectomy, chemotherapy, and radiation - Currently on anastrozole  - Non-functional port in place - Brain tumor resected in 2018  Thromboembolic disease - History of pulmonary embolism attributed to malignancy - Previously treated with Xarelto  - Not currently on anticoagulation  Cerebrovascular disease and mobility impairment - History of stroke with residual balance impairment - Uses walker and occasionally a cane for ambulation - Completed prior physical therapy  Major depressive disorder - Major depressive disorder managed with sertraline  - No anxiety symptoms  Osteoporosis - Osteoporosis diagnosed by bone density testing in 2024 - On weekly alendronate  since April 2025  Fatty liver disease - Diagnosed fatty liver disease - Adheres to a low-fat diet - No alcohol  consumption  Hypothyroidism - Hypothyroidism managed with Synthroid  - Thyroid  function last checked in August  Hearing loss - Decreased hearing   Stress urinary incontinence - Stress urinary incontinence managed by urogynecology     Outpatient Encounter Medications as of 04/22/2024  Medication Sig   alendronate  (FOSAMAX ) 70 MG tablet TAKE 1 TABLET (70 MG TOTAL) BY MOUTH EVERY 7 DAYS WITH FULL GLASS WATER ON EMPTY STOMACH   AMBULATORY NON FORMULARY MEDICATION Nitroglycerin ointment 0.125 %  Apply a pea sized amount internally four times daily.   amLODipine  (NORVASC ) 10 MG tablet TAKE 1 TABLET BY MOUTH EVERY DAY   anastrozole  (ARIMIDEX ) 1 MG tablet TAKE 1 TABLET BY MOUTH EVERY DAY   blood glucose meter kit and supplies KIT Dispense based on patient and insurance preference. Use up to four times daily as directed. (FOR ICD-9 250.00, 250.01).   Calcium Carbonate-Vit D-Min (CALCIUM 1200 PO) Take by mouth.   cephALEXin  (KEFLEX ) 500 MG capsule Take 500 mg by mouth 2 (two) times daily.   Cholecalciferol (VITAMIN D3) 50 MCG (2000 UT) capsule Take 2,000 Units by mouth daily.   dorzolamide-timolol (COSOPT) 22.3-6.8 MG/ML ophthalmic solution    fluticasone  (FLONASE ) 50 MCG/ACT nasal spray Place 1 spray into both nostrils daily.   JARDIANCE 25 MG TABS tablet Take 25 mg by mouth daily.   Lancets (ONETOUCH DELICA PLUS LANCET33G) MISC USE TO check blood sugar UP TO 4 TIMES DAILY   levothyroxine  (SYNTHROID ) 50 MCG tablet Take 1 tablet (50 mcg total) by mouth daily before breakfast.   ondansetron  (ZOFRAN ) 4 MG tablet Take 1 tablet (4 mg total) by mouth as directed. Take 45 min - 1 hour before drinking each dose of your prep solution   ONETOUCH  ULTRA test strip    potassium chloride  (KLOR-CON  M) 10 MEQ tablet Take 1 tablet (10 mEq total) by mouth 2 (two) times daily.   pravastatin  (PRAVACHOL ) 40 MG tablet Take 1 tablet (40 mg total) by mouth daily.   sertraline  (ZOLOFT ) 50 MG tablet Take 1 tablet  daily   tobramycin (TOBREX) 0.3 % ophthalmic solution Place 1 drop into both eyes 3 (three) times daily.   [DISCONTINUED] Prochlorperazine  Maleate (COMPAZINE  PO) Take by mouth.   No facility-administered encounter medications on file as of 04/22/2024.    Past Medical History:  Diagnosis Date   Acid reflux    Allergy    seasonal   Breast cancer (HCC)    right,no iv or blood on right sid, chemo and radiation 2014   CVA (cerebral vascular accident) (HCC)    cva during surgery 2018   DM (diabetes mellitus) (HCC)    Family history of brain cancer    Family history of breast cancer    Heartburn    Hypercholesteremia    taken off chol meds Dec 2012   Hypertension    Personal history of chemotherapy 05/2012   rt breast   Personal history of radiation therapy 10/2012   rt breast    Past Surgical History:  Procedure Laterality Date   BREAST BIOPSY Right 04/16/2012   BREAST SURGERY Right 05/20/12   Rt br mastectomy   CESAREAN SECTION  25 years ago   colonoscopy     COLONOSCOPY WITH PROPOFOL  N/A 01/18/2015   Procedure: COLONOSCOPY WITH PROPOFOL ;  Surgeon: Gustav Shila GAILS, MD;  Location: [facility];  Service: Endoscopy;  Laterality: N/A;   MASTECTOMY Right 05/2012   MODIFIED MASTECTOMY Right 05/20/2012   Procedure: MODIFIED MASTECTOMY;  Surgeon: Morene ONEIDA Olives, MD;  Location: [facility] OR;  Service: General;  Laterality: Right;   PORTACATH PLACEMENT Left 05/20/2012   Procedure: INSERTION PORT-A-CATH;  Surgeon: Morene ONEIDA Olives, MD;  Location: [facility] OR;  Service: General;  Laterality: Left;   SUBOCCIPITAL CRANIECTOMY CERVICAL LAMINECTOMY N/A 11/21/2016   Procedure: SUBOCCIPITAL CRANIECTOMY RESECTION TUMOR;  Surgeon: Mavis Purchase, MD;  Location: [facility] OR;  Service: Neurosurgery;  Laterality: N/A;   TUBAL LIGATION      Family History  Problem Relation Age of Onset   Hypertension Mother    Aneurysm Mother 57       brain   Jaundice Father 73   Brain cancer Sister 68   Diabetes  Daughter    Renal Disease Daughter    Hypertension Daughter    Anemia Daughter    Breast cancer Cousin 70   Breast cancer Cousin 15   Breast cancer Cousin 25   Colon cancer Neg Hx     Social History   Socioeconomic History   Marital status: Divorced    Spouse name: Not on file   Number of children: 2   Years of education: 16   Highest education level: Bachelor's degree (e.g., BA, AB, BS)  Occupational History   Occupation: Health Visitor  Tobacco Use   Smoking status: Never   Smokeless tobacco: Former    Types: Chew    Quit date: 11/18/1996  Vaping Use   Vaping status: Never Used  Substance and Sexual Activity   Alcohol use: No    Alcohol/week: 0.0 standard drinks of alcohol   Drug use: No   Sexual activity: Never    Comment: menarche 14, P2, no HRT  Other Topics Concern   Not on file  Social History Narrative  Fun: Church   Denies religious beliefs effecting health care.    Social Drivers of Health   Tobacco Use: Medium Risk (04/22/2024)   Patient History    Smoking Tobacco Use: Never    Smokeless Tobacco Use: Former    Passive Exposure: Not on Actuary Strain: Low Risk (04/18/2024)   Overall Financial Resource Strain (CARDIA)    Difficulty of Paying Living Expenses: Not very hard  Food Insecurity: Food Insecurity Present (04/18/2024)   Epic    Worried About Programme Researcher, Broadcasting/film/video in the Last Year: Sometimes true    Ran Out of Food in the Last Year: Sometimes true  Transportation Needs: No Transportation Needs (04/18/2024)   Epic    Lack of Transportation (Medical): No    Lack of Transportation (Non-Medical): No  Physical Activity: Inactive (04/18/2024)   Exercise Vital Sign    Days of Exercise per Week: 2 days    Minutes of Exercise per Session: 0 min  Stress: No Stress Concern Present (04/18/2024)   [employer] of Occupational Health - Occupational Stress Questionnaire    Feeling of Stress: Not at all  Social Connections: Unknown  (04/18/2024)   Social Connection and Isolation Panel    Frequency of Communication with Friends and Family: Twice a week    Frequency of Social Gatherings with Friends and Family: Patient declined    Attends Religious Services: More than 4 times per year    Active Member of [employer] or Organizations: Yes    Attends Banker Meetings: 1 to 4 times per year    Marital Status: Divorced  Intimate Partner Violence: Not At Risk (12/11/2023)   Epic    Fear of Current or Ex-Partner: No    Emotionally Abused: No    Physically Abused: No    Sexually Abused: No  Depression (PHQ2-9): Low Risk (04/22/2024)   Depression (PHQ2-9)    PHQ-2 Score: 0  Alcohol Screen: Low Risk (04/18/2024)   Alcohol Screen    Last Alcohol Screening Score (AUDIT): 0  Housing: Low Risk (04/18/2024)   Epic    Unable to Pay for Housing in the Last Year: No    Number of Times Moved in the Last Year: 0    Homeless in the Last Year: No  Utilities: Low Risk (12/17/2023)   Received from [facility]   Utilities    In the past 12 months has the electric, gas, oil, or water company threatened to shut off services in your home? : No  Health Literacy: Adequate Health Literacy (12/11/2023)   B1300 Health Literacy    Frequency of need for help with medical instructions: Never    Review of Systems  Constitutional:  Negative for chills, fever and malaise/fatigue.  Respiratory:  Negative for shortness of breath.   Cardiovascular:  Negative for chest pain, palpitations and leg swelling.  Gastrointestinal:  Negative for abdominal pain, constipation, diarrhea, nausea and vomiting.  Genitourinary:  Negative for dysuria, frequency and urgency.  Musculoskeletal:  Negative for falls.  Neurological:  Negative for dizziness, focal weakness and headaches.  Psychiatric/Behavioral:  Positive for depression. Negative for suicidal ideas. The patient is not nervous/anxious.         Objective    BP 102/70   Pulse 64   Temp 97.6 F  (36.4 C) (Temporal)   Ht 5' 4 (1.626 m)   Wt 147 lb (66.7 kg)   LMP 08/16/2012   SpO2 99%   BMI 25.23 kg/m   Physical  Exam Constitutional:      General: She is not in acute distress.    Appearance: She is not ill-appearing.  Eyes:     Extraocular Movements: Extraocular movements intact.     Conjunctiva/sclera: Conjunctivae normal.  Cardiovascular:     Rate and Rhythm: Normal rate.  Pulmonary:     Effort: Pulmonary effort is normal.  Musculoskeletal:     Cervical back: Normal range of motion and neck supple.  Skin:    General: Skin is warm and dry.  Neurological:     General: No focal deficit present.     Mental Status: She is alert and oriented to person, place, and time.     Comments: Using rolling walker   Psychiatric:        Mood and Affect: Mood normal.        Behavior: Behavior normal.        Thought Content: Thought content normal.         Assessment & Plan:   Problem List Items Addressed This Visit     Essential hypertension, benign - Primary   Fatty liver disease, nonalcoholic   Hyperlipidemia   Port-A-Cath in place   Other Visit Diagnoses       Urinary, incontinence, stress female         Balance disorder         Major depressive disorder with single episode, in full remission         Age-related osteoporosis without current pathological fracture       Relevant Medications   Cholecalciferol (VITAMIN D3) 50 MCG (2000 UT) capsule   Calcium Carbonate-Vit D-Min (CALCIUM 1200 PO)     Aortic atherosclerosis           Assessment and Plan Assessment & Plan Essential hypertension Hypertension is managed with amlodipine . Hx of angioedema with lisinopril .  - Continue amlodipine  for blood pressure management.  Mixed hyperlipidemia with aortic atherosclerosis Hyperlipidemia is poorly controlled with pravastatin . LDL was 131 in August 2024. Aortic atherosclerosis noted on CT in March 2025. Emphasized aggressive cholesterol management due to  atherosclerosis. - Continue pravastatin  daily. - Provided educational materials on cholesterol and atherosclerosis. - Encouraged a low saturated fat diet. - Will check cholesterol levels during next hematology/oncology visit in March or here if not. Declines labs today   Nonalcoholic fatty liver disease Likely improved with weight loss. No alcohol consumption reported. - Continue low-fat diet. - Encouraged weight loss.  Stress urinary incontinence Managed by a urogynecologist. Follow-up appointment scheduled for February 4th. - Attend urogynecologist appointment on February 4th.  Balance disorder with history of stroke Balance disorder persists post-stroke. Uses a walker or cane for mobility. No recent falls reported. - Continue using walker or cane for mobility support.  Major depressive disorder, in remission Depression is in remission with sertraline . - Continue sertraline  for depression management.  Age-related osteoporosis Managed with alendronate  (Fosamax ) since April 2024. Bone density test in 2024 showed osteoporosis. - Continue alendronate  (Fosamax ) weekly. - Will repeat bone density test after May 2026.  Port-A-Cath in place Port-A-Cath is in place but not functioning. No plans for removal due to cost considerations.  General health maintenance Routine health maintenance discussed, including eye exams and hearing aids. - Continue regular eye exams with ophthalmologist.      Return in about 9 weeks (around 06/24/2024) for Fasting follow up.   Boby Mackintosh, NP-C   "

## 2024-04-23 ENCOUNTER — Other Ambulatory Visit: Payer: Self-pay

## 2024-04-23 DIAGNOSIS — C50411 Malignant neoplasm of upper-outer quadrant of right female breast: Secondary | ICD-10-CM

## 2024-05-05 ENCOUNTER — Encounter: Payer: Self-pay | Admitting: Family Medicine

## 2024-05-06 MED ORDER — POTASSIUM CHLORIDE CRYS ER 10 MEQ PO TBCR: 10.0000 meq | EXTENDED_RELEASE_TABLET | Freq: Two times a day (BID) | ORAL | 1 refills | Status: AC

## 2024-05-06 MED ORDER — AMLODIPINE BESYLATE 10 MG PO TABS: 10.0000 mg | ORAL_TABLET | Freq: Every day | ORAL | 1 refills | Status: AC

## 2024-05-06 NOTE — Telephone Encounter (Signed)
 Ok to refill potassium and amlodipine ? 1st fill w you

## 2024-06-04 ENCOUNTER — Other Ambulatory Visit

## 2024-06-11 ENCOUNTER — Ambulatory Visit: Admitting: Hematology and Oncology

## 2024-12-17 ENCOUNTER — Ambulatory Visit
